# Patient Record
Sex: Male | Born: 1980 | Race: White | Hispanic: No | Marital: Single | Smoking: Current every day smoker
Health system: Southern US, Community
[De-identification: ages and names within clinical notes are randomized; demographics above are authoritative.]

## PROBLEM LIST (undated history)

## (undated) DIAGNOSIS — F1010 Alcohol abuse, uncomplicated: Secondary | ICD-10-CM

## (undated) DIAGNOSIS — B1920 Unspecified viral hepatitis C without hepatic coma: Secondary | ICD-10-CM

## (undated) DIAGNOSIS — F4310 Post-traumatic stress disorder, unspecified: Secondary | ICD-10-CM

## (undated) DIAGNOSIS — F419 Anxiety disorder, unspecified: Secondary | ICD-10-CM

## (undated) HISTORY — PX: FACIAL FRACTURE SURGERY: SHX1570

---

## 2021-05-29 ENCOUNTER — Encounter: Payer: Self-pay | Admitting: *Deleted

## 2021-05-29 NOTE — Congregational Nurse Program (Signed)
°  Dept: [phone]   Congregational Nurse Program Note  Date of Encounter: 05/29/2021  Past Medical History: No past medical history on file.  Encounter Details:  CNP Questionnaire - 05/29/21 1221       Questionnaire   Do you give verbal consent to treat you today? Yes    Location Patient Served  [facility]    Visit Setting Church or Organization    Patient Status Homeless    Engineer, building services or [employer] Referral N/A    Medication Have Medication Insecurities    Medical Provider No    Screening Referrals N/A    Medical Referral [facility]    Medical Appointment Made Non-[facility] PCP[facility]clinic    Food N/A    Transportation N/A    Housing/Utilities No permanent housing    Interpersonal Safety N/A    Intervention Patent attorney System    ED Visit Averted Yes    Life-Saving Intervention Made N/A            Client seen in [facility] lobby and asked about navigating health care in area. Client reports he recently arrived from [place] and does not have any keppra with him for his seizures. He said he has had three since coming to the area and asked if he should go to the ED to get his medication. Referred to Lavinia Sharps NP and gave intake papers to client. NP has agreed to see client today. He reports he had [employer] insurance while in [name].  Courtnay Petrilla W RN CN

## 2021-08-08 ENCOUNTER — Encounter (HOSPITAL_COMMUNITY): Payer: Self-pay

## 2021-08-08 ENCOUNTER — Other Ambulatory Visit: Payer: Self-pay

## 2021-08-08 ENCOUNTER — Emergency Department (HOSPITAL_COMMUNITY)
Admission: EM | Admit: 2021-08-08 | Discharge: 2021-08-08 | Disposition: A | Discharge status: Home / Self Care | Payer: Self-pay | Attending: Emergency Medicine | Admitting: Emergency Medicine

## 2021-08-08 ENCOUNTER — Emergency Department (HOSPITAL_COMMUNITY): Payer: Self-pay

## 2021-08-08 DIAGNOSIS — Z85118 Personal history of other malignant neoplasm of bronchus and lung: Secondary | ICD-10-CM | POA: Insufficient documentation

## 2021-08-08 DIAGNOSIS — S0081XA Abrasion of other part of head, initial encounter: Secondary | ICD-10-CM | POA: Insufficient documentation

## 2021-08-08 DIAGNOSIS — S0990XA Unspecified injury of head, initial encounter: Secondary | ICD-10-CM | POA: Insufficient documentation

## 2021-08-08 NOTE — ED Notes (Signed)
Face cleaned and patient given bandages to apply to nasal area. ?
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
## 2021-08-09 ENCOUNTER — Encounter: Payer: Self-pay | Admitting: *Deleted

## 2021-08-09 NOTE — Congregational Nurse Program (Signed)
?  Dept: [phone] ? ? ?Congregational Nurse Program Note ? ?Date of Encounter: 08/09/2021 ? ?Past Medical History: ?No past medical history on file. ? ?Encounter Details: ? CNP Questionnaire - 08/09/21 1422   ? ?  ? Questionnaire  ? Do you give verbal consent to treat you today? Yes   ? Location Patient Served  [facility]   ? Visit Setting Church or Organization   ? Patient Status Homeless   ? Sport and exercise psychologist or [employer]   ? Insurance Referral N/A   ? Medication Have Medication Insecurities   ? Medical Provider Yes   ? Screening Referrals N/A   ? Medical Referral [place]   ? Medical Appointment [place]   ? Food N/A   ? Transportation N/A   ? Housing/Utilities No permanent housing   ? Interpersonal Safety N/A   ? Intervention [employer];Support   ? ED Visit Averted N/A   ? Life-Saving Intervention Made N/A   ? ?  ?  ? ?  ? ?Client came to nurse's office inquiring about behavioral health services. Client reports he has hx of PTSD, depression and anxiety. He is currently being seen by Marliss Coots NP as his PCP. Referred to [facility] for walk in client to be evaluated for services. Gave two bus passes.  ?Barrie Wale W RN CN ? ? ?

## 2021-08-14 ENCOUNTER — Encounter: Payer: Self-pay | Admitting: *Deleted

## 2021-08-14 NOTE — Congregational Nurse Program (Signed)
?  Dept: [phone] ? ? ?Congregational Nurse Program Note ? ?Date of Encounter: 08/14/2021 ? ?Past Medical History: ?No past medical history on file. ? ?Encounter Details: ? CNP Questionnaire - 08/14/21 0836   ? ?  ? Questionnaire  ? Do you give verbal consent to treat you today? Yes   ? Location Patient Served  [facility]   ? Visit Setting Church or Organization   ? Patient Status Homeless   ? Insurance Medicaid   ? Insurance Referral N/A   ? Medication N/A   ? Medical Provider Yes   ? Screening Referrals N/A   ? Medical Referral N/A   ? Medical Appointment Made N/A   ? Food N/A   ? Transportation Provided transportation assistance   ? Housing/Utilities No permanent housing   ? Interpersonal Safety N/A   ? Intervention Support   ? ED Visit Averted N/A   ? Life-Saving Intervention Made N/A   ? ?  ?  ? ?  ? ?Client came to nurse's office asking for bus passes. He said he was going to [facility] and used the bus passes given from March 10th for the [facility] office. Gave two more bus passes. ?Genieve Ramaswamy W RN CN ? ? ?

## 2021-10-03 ENCOUNTER — Encounter: Payer: Self-pay | Admitting: *Deleted

## 2021-10-03 ENCOUNTER — Emergency Department (HOSPITAL_COMMUNITY)
Admission: EM | Admit: 2021-10-03 | Discharge: 2021-10-03 | Disposition: A | Discharge status: Home / Self Care | Payer: 59 | Attending: Emergency Medicine | Admitting: Emergency Medicine

## 2021-10-03 DIAGNOSIS — R0602 Shortness of breath: Secondary | ICD-10-CM | POA: Insufficient documentation

## 2021-10-03 DIAGNOSIS — R569 Unspecified convulsions: Secondary | ICD-10-CM | POA: Diagnosis not present

## 2021-10-03 DIAGNOSIS — F419 Anxiety disorder, unspecified: Secondary | ICD-10-CM | POA: Diagnosis not present

## 2021-10-03 DIAGNOSIS — Z5321 Procedure and treatment not carried out due to patient leaving prior to being seen by health care provider: Secondary | ICD-10-CM | POA: Diagnosis not present

## 2021-10-03 NOTE — ED Triage Notes (Signed)
BIBA ?Per EMS:  ?Pt [facility] w/ c/o anxiety attack. Hyperventilation  ?Lung clear ?Dx lung Ca x2 months ago ?Hx seizures - does not have access to keppra  ?140/70 ?84HR  ?[id number] ?97% RA ? ?

## 2021-10-03 NOTE — Congregational Nurse Program (Signed)
Called by chaplain to see pt having "asthma" attack. Pt sitting in hunched position, expiratory and inspiratory wheezing audible w/o stethoscope. Pt in very emotional state, crying, stating chest "hurts". Conversation of pt from one subject to another. Possible panic attackw/ asthma exacerbation. ? ?Bp 146/97 ?Hr 94 ?O2 sat 94% ?EMS present at time of VS check. ?Report given to EMS ?Transport to [place] ? ?

## 2021-10-03 NOTE — ED Notes (Signed)
Pt requesting to leave.

## 2021-10-07 ENCOUNTER — Encounter: Payer: Self-pay | Admitting: *Deleted

## 2021-10-07 NOTE — Congregational Nurse Program (Signed)
?  Dept: [phone] ? ? ?Congregational Nurse Program Note ? ?Date of Encounter: 10/07/2021 ? ?Past Medical History: ?No past medical history on file. ? ?Encounter Details: ? CNP Questionnaire - 10/07/21 1301   ? ?  ? Questionnaire  ? Do you give verbal consent to treat you today? Yes   ? Location Patient Served  [facility]   ? Visit Setting Church or Organization   ? Patient Status Homeless   ? Insurance Medicaid   ? Insurance Referral N/A   ? Medication Have Medication Insecurities   ? Medical Provider Yes   ? Screening Referrals N/A   ? Medical Referral N/A   ? Medical Appointment Made N/A   ? Food Have Food Insecurities   ? Transportation Need transportation assistance;Provided transportation assistance   ? Housing/Utilities No permanent housing   ? Interpersonal Safety Do not feel safe at current residence   ? Intervention Support   ? ED Visit Averted N/A   ? Life-Saving Intervention Made N/A   ? ?  ?  ? ?  ? ?Client seen at [facility] with f/u from ED visit. Client reports he does not have medication that is at [facility] because he does not have transportation. Gave two bus passes to p/u medication. Client is working with CM Katrina at [facility] and staying with [employer]. He has an appt with Lavinia Sharps NP at end of the month. No other requests or concerns at this time. ?Zeppelin Commisso W RN CN ? ? ?

## 2021-10-09 ENCOUNTER — Encounter: Payer: Self-pay | Admitting: *Deleted

## 2021-10-09 NOTE — Congregational Nurse Program (Signed)
?  Dept: [phone] ? ? ?Congregational Nurse Program Note ? ?Date of Encounter: 10/09/2021 ? ?Past Medical History: ?No past medical history on file. ? ?Encounter Details: ? CNP Questionnaire - 10/09/21 1400   ? ?  ? Questionnaire  ? Do you give verbal consent to treat you today? Yes   ? Location Patient Served  [facility]   ? Visit Setting Church or Organization   ? Patient Status Homeless   ? Insurance Medicaid   ? Insurance Referral N/A   ? Medication N/A   ? Medical Provider Yes   ? Screening Referrals N/A   ? Medical Referral [facility]   ? Medical Appointment Made Other   walk in appointments  ? Food N/A   ? Transportation Need transportation assistance;Provided transportation assistance   ? Housing/Utilities No permanent housing   ? Interpersonal Safety N/A   ? Intervention Support   ? ED Visit Averted N/A   ? Life-Saving Intervention Made N/A   ? ?  ?  ? ?  ? ?Client came to nurse's office after talking with [facility] SW. Earlier today, he had spoken with someone helping him with housing and became upset due to having to sign papers for release of information. Sat/listened with client as he expressed his frustration and explained reason for information request. Client had became so upset earlier, that he had expressed si thoughts. Client currently contracts for safety. He has agreed to go to [facility] in the morning to start working on his mental health needs.Gave bus passes for transportation. He is to meet with SW at [facility] again tomorrow and has a Water engineer.  ?Tahlia Deamer W RN CN  ? ? ?

## 2021-11-13 ENCOUNTER — Emergency Department (HOSPITAL_COMMUNITY): Payer: No Typology Code available for payment source

## 2021-11-13 ENCOUNTER — Encounter (HOSPITAL_COMMUNITY): Payer: Self-pay | Admitting: Emergency Medicine

## 2021-11-13 ENCOUNTER — Other Ambulatory Visit: Payer: Self-pay

## 2021-11-13 ENCOUNTER — Emergency Department (HOSPITAL_COMMUNITY)
Admission: EM | Admit: 2021-11-13 | Discharge: 2021-11-14 | Disposition: A | Discharge status: Home / Self Care | Payer: No Typology Code available for payment source | Attending: Emergency Medicine | Admitting: Emergency Medicine

## 2021-11-13 DIAGNOSIS — J189 Pneumonia, unspecified organism: Secondary | ICD-10-CM | POA: Diagnosis not present

## 2021-11-13 DIAGNOSIS — F410 Panic disorder [episodic paroxysmal anxiety] without agoraphobia: Secondary | ICD-10-CM | POA: Insufficient documentation

## 2021-11-13 DIAGNOSIS — Z20822 Contact with and (suspected) exposure to covid-19: Secondary | ICD-10-CM | POA: Diagnosis not present

## 2021-11-13 HISTORY — DX: Anxiety disorder, unspecified: F41.9

## 2021-11-13 HISTORY — DX: Post-traumatic stress disorder, unspecified: F43.10

## 2021-11-13 LAB — RESP PANEL BY RT-PCR (FLU A&B, COVID) ARPGX2
Influenza A by PCR: NEGATIVE
Influenza B by PCR: NEGATIVE
SARS Coronavirus 2 by RT PCR: NEGATIVE

## 2021-11-13 LAB — CBC
HCT: 51.5 % (ref 39.0–52.0)
Hemoglobin: 17.6 g/dL — ABNORMAL HIGH (ref 13.0–17.0)
MCH: 32 pg (ref 26.0–34.0)
MCHC: 34.2 g/dL (ref 30.0–36.0)
MCV: 93.6 fL (ref 80.0–100.0)
Platelets: 252 10*3/uL (ref 150–400)
RBC: 5.5 MIL/uL (ref 4.22–5.81)
RDW: 12.5 % (ref 11.5–15.5)
WBC: 16.8 10*3/uL — ABNORMAL HIGH (ref 4.0–10.5)
nRBC: 0 % (ref 0.0–0.2)

## 2021-11-13 LAB — TROPONIN I (HIGH SENSITIVITY): Troponin I (High Sensitivity): 2 ng/L (ref <18)

## 2021-11-13 LAB — BASIC METABOLIC PANEL
Anion gap: 14 (ref 5–15)
BUN: UNDETERMINED mg/dL (ref 6–20)
CO2: 14 mmol/L — ABNORMAL LOW (ref 22–32)
Calcium: 9.2 mg/dL (ref 8.9–10.3)
Chloride: 110 mmol/L (ref 98–111)
Creatinine, Ser: UNDETERMINED mg/dL (ref 0.61–1.24)
Glucose, Bld: 103 mg/dL — ABNORMAL HIGH (ref 70–99)
Potassium: 4.3 mmol/L (ref 3.5–5.1)
Sodium: 138 mmol/L (ref 135–145)

## 2021-11-13 MED ORDER — LORAZEPAM 1 MG PO TABS
1.0000 mg | ORAL_TABLET | Freq: Once | ORAL | Status: AC
  Administered 2021-11-13: 1 mg via ORAL
  Filled 2021-11-13: qty 1

## 2021-11-13 NOTE — ED Provider Triage Note (Addendum)
Emergency Medicine Provider Triage Evaluation Note  Raymond [name] , a 41 y.o. male  was evaluated in triage.  Pt complains of chest tightness and anxiety.  Patient reports that he has a history of anxiety, PTSD.  Patient reports that he is also experiencing chest tightness that is associated with shortness of breath, nonradiating.  Patient denies any medications currently.  Patient states that he has lung cancer.  Patient hyperventilating on examination.  Patient also noted to be febrile in triage, endorsing some nasal congestion.  Respiratory panel has been placed.  Review of Systems  Positive:  Negative:   Physical Exam  BP (!) 142/105 (BP Location: Left Arm)   Pulse (!) 141   Temp (!) 100.4 F (38 C) (Oral)   Resp 18   Ht 5\' 8"  (1.727 m)   Wt 90.7 kg   SpO2 94%   BMI 30.41 kg/m  Gen:   Awake, no distress   Resp:  Normal effort  MSK:   Moves extremities without difficulty  Other:    Medical Decision Making  Medically screening exam initiated at 8:09 PM.  Appropriate orders placed.  was informed that the remainder of the evaluation will be completed by another provider, this initial triage assessment does not replace that evaluation, and the importance of remaining in the ED until their evaluation is complete.     [name], PA-C 11/13/21 2010    11/15/21, PA-C 11/13/21 2010    11/15/21, PA-C 11/13/21 2018

## 2021-11-13 NOTE — ED Triage Notes (Signed)
  Patient BIB EMS from [facility] [employer].  Patient states for the past two days he has felt tightness in his chest and panicky.  Patient has hx of anxiety and PTSD.  Patient states he hasn't been able to sleep in 2 days because he is afraid he wont wake up.  Denies any drug use or alcohol.  Pain 5/10, tightness in chest.

## 2021-11-14 ENCOUNTER — Encounter (HOSPITAL_COMMUNITY): Payer: Self-pay | Admitting: Emergency Medicine

## 2021-11-14 MED ORDER — DOXYCYCLINE HYCLATE 100 MG PO TABS
100.0000 mg | ORAL_TABLET | Freq: Once | ORAL | Status: AC
  Administered 2021-11-14: 100 mg via ORAL
  Filled 2021-11-14: qty 1

## 2021-11-14 MED ORDER — DOXYCYCLINE HYCLATE 100 MG PO CAPS
100.0000 mg | ORAL_CAPSULE | Freq: Two times a day (BID) | ORAL | 0 refills | Status: DC
Start: 2021-11-14 — End: 2021-11-22

## 2021-11-14 MED ORDER — AEROCHAMBER Z-STAT PLUS/MEDIUM MISC
1.0000 | Freq: Once | Status: AC
  Administered 2021-11-14: 1

## 2021-11-14 MED ORDER — NAPROXEN 500 MG PO TABS
500.0000 mg | ORAL_TABLET | Freq: Once | ORAL | Status: AC
  Administered 2021-11-14: 500 mg via ORAL
  Filled 2021-11-14: qty 1

## 2021-11-14 MED ORDER — ALBUTEROL SULFATE HFA 108 (90 BASE) MCG/ACT IN AERS
2.0000 | INHALATION_SPRAY | RESPIRATORY_TRACT | Status: DC | PRN
  Administered 2021-11-14: 2 via RESPIRATORY_TRACT
  Filled 2021-11-14: qty 6.7

## 2021-11-14 NOTE — ED Provider Notes (Signed)
[facility]Y DEPT Provider Note: Lowella Dell, MD, FACEP  CSN: [id number] MRN: [id number] ARRIVAL: 11/13/21 at 1937 ROOM: WA21/WA21   CHIEF COMPLAINT  Panic Attack   HISTORY OF PRESENT ILLNESS  11/14/21 2:51 AM Raymond [name] is a 41 y.o. male from [place].  He is here stating he has been unable to sleep for 2 nights because he is afraid he will not wake up.  He has been having panic attacks and was hyperventilating on arrival.  He was given 1 mg of Ativan and was able to sleep for several hours.  When he was awakened and moved back to his room he started having another panic attack.  He states he feels like he cannot breathe and he has tightness in his chest which he attributes to rapid breathing.  This tightness is worse with certain movements or palpation of the chest.  He is using a borrowed albuterol inhaler.  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  He was noted to have a temperature as high as 100.7 in the emergency department.   Past Medical History:  Diagnosis Date   Anxiety    PTSD (post-traumatic stress disorder)     History reviewed. No pertinent surgical history.  History reviewed. No pertinent family history.  Social History   Substance Use Topics   Alcohol use: Not Currently   Drug use: Not Currently    Prior to Admission medications   Medication Sig Start Date End Date Taking? Authorizing Provider  doxycycline (VIBRAMYCIN) 100 MG capsule Take 1 capsule (100 mg total) by mouth 2 (two) times daily. One po bid x 7 days 11/14/21  Yes Shalla Bulluck, MD  albuterol (VENTOLIN HFA) 108 (90 Base) MCG/ACT inhaler SMARTSIG:2 Puff(s) Via Inhaler 4 Times Daily PRN 10/04/21   [provider]  prazosin (MINIPRESS) 1 MG capsule Take 1 mg by mouth at bedtime. 09/05/21   [provider]  traZODone (DESYREL) 50 MG tablet Take 50 mg by mouth at bedtime. 09/05/21    [provider]  ZOLOFT 25 MG tablet Take 25 mg by mouth daily. 09/05/21   [provider]    Allergies Other   REVIEW OF SYSTEMS  Negative except as noted here or in the History of Present Illness.   PHYSICAL EXAMINATION  Initial Vital Signs Blood pressure (!) 147/101, pulse (!) 122, temperature 99.4 F (37.4 C), temperature source Oral, resp. rate 20, height 5\' 8"  (1.727 m), weight 90.7 kg, SpO2 95 %.  Examination General: Well-developed, well-nourished male in no acute distress; appearance consistent with age of record HENT: normocephalic; atraumatic Eyes: pupils equal, round and reactive to light; extraocular muscles intact Neck: supple Heart: regular rate and rhythm; no murmurs, rubs or gallops Lungs: Hyperventilating Chest: Pain on movement of anterior chest Abdomen: soft; nondistended; nontender; bowel sounds present Extremities: No deformity; full range of motion; pulses normal Neurologic: Awake, alert; motor function intact in all extremities and symmetric; no facial droop Skin: Warm and dry Psychiatric: Anxious; tearful   RESULTS  Summary of this visit's results, reviewed and interpreted by myself:   EKG Interpretation  Date/Time:  Wednesday November 13 2021 20:06:18 EDT Ventricular Rate:  137 PR Interval:  146 QRS Duration: 81 QT Interval:  280 QTC Calculation: 423 R Axis:   5 Text Interpretation: Sinus tachycardia Minimal ST depression, lateral leads Borderline ST elevation, anterior leads No previous ECGs available Confirmed  by Paula Libra ([id number]) on 11/14/2021 2:52:10 AM       Laboratory Studies: Results for orders placed or performed during the hospital encounter of 11/13/21 (from the past 24 hour(s))  Resp Panel by RT-PCR (Flu A&B, Covid) Anterior Nasal Swab     Status: None   Collection Time: 11/13/21  8:16 PM   Specimen: Anterior Nasal Swab  Result Value Ref Range   SARS Coronavirus 2 by RT PCR NEGATIVE NEGATIVE   Influenza A by  PCR NEGATIVE NEGATIVE   Influenza B by PCR NEGATIVE NEGATIVE  Basic metabolic panel     Status: Abnormal   Collection Time: 11/13/21  9:05 PM  Result Value Ref Range   Sodium 138 135 - 145 mmol/L   Potassium 4.3 3.5 - 5.1 mmol/L   Chloride 110 98 - 111 mmol/L   CO2 14 (L) 22 - 32 mmol/L   Glucose, Bld 103 (H) 70 - 99 mg/dL   BUN QUANTITY NOT SUFFICIENT, UNABLE TO PERFORM TEST 6 - 20 mg/dL   Creatinine, Ser QUANTITY NOT SUFFICIENT, UNABLE TO PERFORM TEST 0.61 - 1.24 mg/dL   Calcium 9.2 8.9 - 44.0 mg/dL   GFR, Estimated NOT CALCULATED >60 mL/min   Anion gap 14 5 - 15  CBC     Status: Abnormal   Collection Time: 11/13/21  9:05 PM  Result Value Ref Range   WBC 16.8 (H) 4.0 - 10.5 K/uL   RBC 5.50 4.22 - 5.81 MIL/uL   Hemoglobin 17.6 (H) 13.0 - 17.0 g/dL   HCT 34.7 42.5 - 95.6 %   MCV 93.6 80.0 - 100.0 fL   MCH 32.0 26.0 - 34.0 pg   MCHC 34.2 30.0 - 36.0 g/dL   RDW 38.7 56.4 - 33.2 %   Platelets 252 150 - 400 K/uL   nRBC 0.0 0.0 - 0.2 %  Troponin I (High Sensitivity)     Status: None   Collection Time: 11/13/21  9:05 PM  Result Value Ref Range   Troponin I (High Sensitivity) 2 <18 ng/L   Imaging Studies: DG Chest Port 1 View  Result Date: 11/13/2021 CLINICAL DATA:  Chest pain. EXAM: PORTABLE CHEST 1 VIEW COMPARISON:  Chest radiograph dated 08/08/2021. FINDINGS: Bilateral lower lung field faint nodularity appear more progressed since the prior radiograph and may represent developing infiltrate, possibly atypical in etiology. Clinical correlation is recommended. No consolidative changes. There is no pleural effusion pneumothorax. The cardiac silhouette is within limits. No acute osseous pathology. IMPRESSION: Possible developing infiltrate at the lung bases. No focal consolidation. Electronically Signed   By: Elgie Collard M.D.   On: 11/13/2021 20:32    ED COURSE and MDM  Nursing notes, initial and subsequent vitals signs, including pulse oximetry, reviewed and interpreted by  myself.  Vitals:   11/13/21 1947 11/13/21 2002 11/13/21 2152 11/14/21 0051  BP: (!) 142/105  (!) 176/139 (!) 147/101  Pulse: (!) 141  (!) 132 (!) 122  Resp: 18  18 20   Temp: (!) 100.4 F (38 C)  (!) 100.7 F (38.2 C) 99.4 F (37.4 C)  TempSrc: Oral  Oral Oral  SpO2: 94%  (!) 87% 95%  Weight:  90.7 kg    Height:  5\' 8"  (1.727 m)     Medications  albuterol (VENTOLIN HFA) 108 (90 Base) MCG/ACT inhaler 2 puff (has no administration in time range)  aerochamber plus with mask device 1 each (has no administration in time range)  naproxen (NAPROSYN) tablet 500 mg (has no administration  in time range)  doxycycline (VIBRA-TABS) tablet 100 mg (has no administration in time range)  LORazepam (ATIVAN) tablet 1 mg (1 mg Oral Given 11/13/21 2019)   The patient appears to have both an anxiety issue as well as legitimate respiratory issue.  He has a low-grade fever as well as what appears to be early infiltrates on his chest x-ray.  We will provide him his own inhaler and AeroChamber and start him on doxycycline for possible pneumonia.  He was advised to return if breathing worsens.   PROCEDURES  Procedures   ED DIAGNOSES     ICD-10-CM   1. Panic attack  F41.0     2. Multifocal pneumonia  J18.9          Thelma Lorenzetti, Jonny Ruiz, MD 11/14/21 [phone]

## 2021-11-22 ENCOUNTER — Encounter (HOSPITAL_COMMUNITY): Payer: Self-pay

## 2021-11-22 ENCOUNTER — Emergency Department (HOSPITAL_COMMUNITY): Payer: 59

## 2021-11-22 ENCOUNTER — Other Ambulatory Visit: Payer: Self-pay

## 2021-11-22 ENCOUNTER — Emergency Department (HOSPITAL_COMMUNITY)
Admission: EM | Admit: 2021-11-22 | Discharge: 2021-11-22 | Disposition: A | Discharge status: Home / Self Care | Payer: 59 | Attending: Emergency Medicine | Admitting: Emergency Medicine

## 2021-11-22 DIAGNOSIS — R072 Precordial pain: Secondary | ICD-10-CM | POA: Diagnosis present

## 2021-11-22 DIAGNOSIS — J181 Lobar pneumonia, unspecified organism: Secondary | ICD-10-CM | POA: Diagnosis not present

## 2021-11-22 DIAGNOSIS — J189 Pneumonia, unspecified organism: Secondary | ICD-10-CM

## 2021-11-22 DIAGNOSIS — R079 Chest pain, unspecified: Secondary | ICD-10-CM

## 2021-11-22 HISTORY — DX: Unspecified viral hepatitis C without hepatic coma: B19.20

## 2021-11-22 LAB — BASIC METABOLIC PANEL
Anion gap: 9 (ref 5–15)
BUN: 9 mg/dL (ref 6–20)
CO2: 23 mmol/L (ref 22–32)
Calcium: 9.2 mg/dL (ref 8.9–10.3)
Chloride: 107 mmol/L (ref 98–111)
Creatinine, Ser: 0.72 mg/dL (ref 0.61–1.24)
GFR, Estimated: >60 mL/min (ref >60)
Glucose, Bld: 98 mg/dL (ref 70–99)
Potassium: 4.3 mmol/L (ref 3.5–5.1)
Sodium: 139 mmol/L (ref 135–145)

## 2021-11-22 LAB — CBC
HCT: 46.7 % (ref 39.0–52.0)
Hemoglobin: 15.6 g/dL (ref 13.0–17.0)
MCH: 31.1 pg (ref 26.0–34.0)
MCHC: 33.4 g/dL (ref 30.0–36.0)
MCV: 93.2 fL (ref 80.0–100.0)
Platelets: 308 10*3/uL (ref 150–400)
RBC: 5.01 MIL/uL (ref 4.22–5.81)
RDW: 12.4 % (ref 11.5–15.5)
WBC: 8 10*3/uL (ref 4.0–10.5)
nRBC: 0 % (ref 0.0–0.2)

## 2021-11-22 LAB — TROPONIN I (HIGH SENSITIVITY)
Troponin I (High Sensitivity): 3 ng/L (ref <18)
Troponin I (High Sensitivity): 4 ng/L (ref <18)

## 2021-11-22 MED ORDER — MELOXICAM 15 MG PO TABS: 15.0000 mg | ORAL_TABLET | Freq: Every day | ORAL | 0 refills | Status: AC

## 2021-11-22 MED ORDER — IOHEXOL 350 MG/ML SOLN
100.0000 mL | Freq: Once | INTRAVENOUS | Status: AC | PRN
  Administered 2021-11-22: 100 mL via INTRAVENOUS

## 2021-11-22 MED ORDER — FENTANYL CITRATE PF 50 MCG/ML IJ SOSY
50.0000 ug | PREFILLED_SYRINGE | Freq: Once | INTRAMUSCULAR | Status: AC
  Administered 2021-11-22: 50 ug via INTRAVENOUS
  Filled 2021-11-22: qty 1

## 2021-11-22 MED ORDER — AZITHROMYCIN 250 MG PO TABS: 250.0000 mg | ORAL_TABLET | Freq: Every day | ORAL | 0 refills | Status: DC

## 2021-11-22 MED ORDER — AMOXICILLIN-POT CLAVULANATE 875-125 MG PO TABS: 1.0000 | ORAL_TABLET | Freq: Two times a day (BID) | ORAL | 0 refills | Status: DC

## 2021-11-22 NOTE — ED Triage Notes (Signed)
Pt BIB [facility] c/o chest pain. Pt reports having a fall to right side 2 days ago suffered CP now worsening pain. Pt describes as sharp pain when taking a breath.

## 2021-11-27 ENCOUNTER — Encounter: Payer: Self-pay | Admitting: *Deleted

## 2021-11-27 NOTE — Congregational Nurse Program (Signed)
  Dept: [phone]   Congregational Nurse Program Note  Date of Encounter: 11/27/2021  Past Medical History: Past Medical History:  Diagnosis Date   Anxiety    Hepatitis C    PTSD (post-traumatic stress disorder)     Encounter Details:  CNP Questionnaire - 11/27/21 0924       Questionnaire   Do you give verbal consent to treat you today? Yes    Location Patient Served  [facility]    Visit Setting Church or Organization    Patient Status Homeless    Insurance [facility]    Insurance Referral N/A    Medication N/A    Medical Provider Yes    Screening Referrals N/A    Medical Referral N/A    Medical Appointment Made Other;N/A    Food N/A    Transportation Need transportation assistance;Provided transportation assistance    Housing/Utilities No permanent housing    Interpersonal Safety N/A    Intervention Support    ED Visit Averted N/A    Life-Saving Intervention Made N/A            Client came to nurse's office requesting help with transportation. Client had hospital discharge papers with him showing medication prescribed and he needs to pick up. Gave bus passes for medication pick up. Educated client on importance of taking antibiotics as prescribed and not missing doses. He acknowledges understanding. Anjalina Bergevin W RN CN

## 2021-12-23 ENCOUNTER — Encounter: Payer: Self-pay | Admitting: *Deleted

## 2021-12-23 NOTE — Congregational Nurse Program (Signed)
  Dept: [phone]   Congregational Nurse Program Note  Date of Encounter: 12/23/2021  Past Medical History: Past Medical History:  Diagnosis Date   Anxiety    Hepatitis C    PTSD (post-traumatic stress disorder)     Encounter Details:  CNP Questionnaire - 12/23/21 1259       Questionnaire   Do you give verbal consent to treat you today? Yes    Location Patient Served  [facility]    Visit Setting Church or Organization    Patient Status Homeless    Insurance [facility]    Insurance Referral N/A    Medication N/A    Medical Provider Yes    Screening Referrals N/A    Medical Referral N/A    Medical Appointment Made Other;N/A    Food N/A    Transportation Need transportation assistance;Provided transportation assistance    Housing/Utilities No permanent housing    Interpersonal Safety N/A    Intervention Support    ED Visit Averted N/A    Life-Saving Intervention Made N/A            Client came to nurse's office requesting help with transportation to new housing. Client reports he is in process of getting housing and has been working with CM on completing disability papers.Gave two discount bus passes as requested. Hellon Vaccarella W RN CN

## 2022-01-06 ENCOUNTER — Encounter: Payer: Self-pay | Admitting: *Deleted

## 2022-01-06 NOTE — Congregational Nurse Program (Signed)
  Dept: [phone]   Congregational Nurse Program Note  Date of Encounter: 01/06/2022  Past Medical History: Past Medical History:  Diagnosis Date   Anxiety    Hepatitis C    PTSD (post-traumatic stress disorder)     Encounter Details:  CNP Questionnaire - 01/06/22 1440       Questionnaire   Do you give verbal consent to treat you today? Yes    Location Patient Served  [facility]    Visit Setting Church or Organization    Patient Status Homeless    Insurance [facility]    Insurance Referral N/A    Medication N/A    Medical Provider Yes    Screening Referrals N/A    Medical Referral N/A    Medical Appointment Made N/A    Food N/A    Transportation Need transportation assistance;Provided transportation assistance    Housing/Utilities No permanent housing    Interpersonal Safety N/A    Intervention Support    ED Visit Averted N/A    Life-Saving Intervention Made N/A            Client came to nurse's office requesting help with transportation to [facility] office concerning disability. Gave two bus passes as requested. Malayah Demuro W RN CN

## 2022-01-08 LAB — GLUCOSE, POCT (MANUAL RESULT ENTRY): POC Glucose: 283 mg/dl — AB (ref 70–99)

## 2022-06-06 ENCOUNTER — Encounter: Payer: Self-pay | Admitting: *Deleted

## 2022-06-06 NOTE — Congregational Nurse Program (Signed)
  Dept: [phone]   Congregational Nurse Program Note  Date of Encounter: 06/06/2022  Past Medical History: Past Medical History:  Diagnosis Date   Anxiety    Hepatitis C    PTSD (post-traumatic stress disorder)     Encounter Details:  CNP Questionnaire - 06/06/22 1144       Questionnaire   Ask client: Do you give verbal consent for me to treat you today? Yes    Student Assistance N/A    Location Patient Served  [facility]    Visit Setting with Client Organization    Patient Status Unknown    Insurance Medicaid    Insurance/Financial Assistance Referral N/A    Medication N/A    Medical Provider No    Screening Referrals Made N/A    Medical Referrals Made [facility]    Medical Appointment Made N/A    Recently w/o PCP, now 1st time PCP visit completed due to CNs referral or appointment made N/A    Food N/A    Transportation N/A    Housing/Utilities N/A    Interpersonal Safety N/A    Interventions Advocate/Support    Abnormal to Normal Screening Since Last CN Visit N/A    Screenings CN Performed Blood Pressure    Sent Client to Lab for: N/A    Did client attend any of the following based off CNs referral or appointments made? N/A    ED Visit Averted N/A    Life-Saving Intervention Made N/A           Client came to nurse's office asking about PCP. Client has seen Marliss Coots NP in the past but now has medicaid. He currently has a place to stay. Contacted provider listed on his insurance card Dr Katherine Roan [phone] and office is closed on Friday. Showed client where find information on his insurance card and advised to contact Monday to establish his PCP. Client acknowledges understanding. Raymond [name] W RN CN

## 2022-08-13 LAB — AMB RESULTS CONSOLE CBG: Glucose: 151

## 2022-08-13 NOTE — Progress Notes (Signed)
Pt is not fasting. Pt has case manager through [facility] to help find PCP

## 2022-09-20 ENCOUNTER — Emergency Department (HOSPITAL_COMMUNITY): Payer: No Typology Code available for payment source

## 2022-09-20 ENCOUNTER — Encounter (HOSPITAL_COMMUNITY): Payer: Self-pay

## 2022-09-20 ENCOUNTER — Other Ambulatory Visit: Payer: Self-pay

## 2022-09-20 ENCOUNTER — Emergency Department (HOSPITAL_COMMUNITY)
Admission: EM | Admit: 2022-09-20 | Discharge: 2022-09-22 | Disposition: A | Discharge status: Other Acute Inpatient Hospital | Payer: No Typology Code available for payment source | Attending: Emergency Medicine | Admitting: Emergency Medicine

## 2022-09-20 DIAGNOSIS — F17210 Nicotine dependence, cigarettes, uncomplicated: Secondary | ICD-10-CM | POA: Diagnosis not present

## 2022-09-20 DIAGNOSIS — R0682 Tachypnea, not elsewhere classified: Secondary | ICD-10-CM | POA: Insufficient documentation

## 2022-09-20 DIAGNOSIS — F410 Panic disorder [episodic paroxysmal anxiety] without agoraphobia: Secondary | ICD-10-CM | POA: Insufficient documentation

## 2022-09-20 DIAGNOSIS — G40909 Epilepsy, unspecified, not intractable, without status epilepticus: Secondary | ICD-10-CM | POA: Diagnosis not present

## 2022-09-20 DIAGNOSIS — F329 Major depressive disorder, single episode, unspecified: Secondary | ICD-10-CM | POA: Diagnosis not present

## 2022-09-20 DIAGNOSIS — F32A Depression, unspecified: Secondary | ICD-10-CM | POA: Diagnosis present

## 2022-09-20 DIAGNOSIS — F29 Unspecified psychosis not due to a substance or known physiological condition: Secondary | ICD-10-CM | POA: Insufficient documentation

## 2022-09-20 DIAGNOSIS — R45851 Suicidal ideations: Secondary | ICD-10-CM | POA: Insufficient documentation

## 2022-09-20 LAB — ACETAMINOPHEN LEVEL: Acetaminophen (Tylenol), Serum: <10 ug/mL — ABNORMAL LOW (ref 10–30)

## 2022-09-20 LAB — COMPREHENSIVE METABOLIC PANEL
ALT: 32 U/L (ref 0–44)
AST: 31 U/L (ref 15–41)
Albumin: 4.6 g/dL (ref 3.5–5.0)
Alkaline Phosphatase: 124 U/L (ref 38–126)
Anion gap: 12 (ref 5–15)
BUN: 8 mg/dL (ref 6–20)
CO2: 19 mmol/L — ABNORMAL LOW (ref 22–32)
Calcium: 9.2 mg/dL (ref 8.9–10.3)
Chloride: 103 mmol/L (ref 98–111)
Creatinine, Ser: 1.03 mg/dL (ref 0.61–1.24)
GFR, Estimated: >60 mL/min (ref >60)
Glucose, Bld: 89 mg/dL (ref 70–99)
Potassium: 3.5 mmol/L (ref 3.5–5.1)
Sodium: 134 mmol/L — ABNORMAL LOW (ref 135–145)
Total Bilirubin: 1.7 mg/dL — ABNORMAL HIGH (ref 0.3–1.2)
Total Protein: 7.8 g/dL (ref 6.5–8.1)

## 2022-09-20 LAB — SALICYLATE LEVEL: Salicylate Lvl: <7 mg/dL — ABNORMAL LOW (ref 7.0–30.0)

## 2022-09-20 LAB — CBC
HCT: 46 % (ref 39.0–52.0)
Hemoglobin: 16.3 g/dL (ref 13.0–17.0)
MCH: 31.3 pg (ref 26.0–34.0)
MCHC: 35.4 g/dL (ref 30.0–36.0)
MCV: 88.3 fL (ref 80.0–100.0)
Platelets: 266 10*3/uL (ref 150–400)
RBC: 5.21 MIL/uL (ref 4.22–5.81)
RDW: 14.1 % (ref 11.5–15.5)
WBC: 7.9 10*3/uL (ref 4.0–10.5)
nRBC: 0 % (ref 0.0–0.2)

## 2022-09-20 LAB — RAPID URINE DRUG SCREEN, HOSP PERFORMED
Amphetamines: POSITIVE — AB
Barbiturates: NOT DETECTED
Benzodiazepines: NOT DETECTED
Cocaine: POSITIVE — AB
Opiates: NOT DETECTED
Tetrahydrocannabinol: POSITIVE — AB

## 2022-09-20 LAB — ETHANOL: Alcohol, Ethyl (B): <10 mg/dL (ref <10)

## 2022-09-20 MED ORDER — LORAZEPAM 1 MG PO TABS
1.0000 mg | ORAL_TABLET | Freq: Once | ORAL | Status: AC
  Administered 2022-09-20: 1 mg via ORAL
  Filled 2022-09-20: qty 1
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
## 2022-09-20 NOTE — ED Notes (Signed)
Pt is voluntary  consent form attached to the clipboard in orange zone

## 2022-09-20 NOTE — ED Notes (Signed)
Pt belongings has been placed in locker 3.Wearing burgundy scrubs at this time.

## 2022-09-20 NOTE — ED Notes (Signed)
IVC paperwork complete and in orange zone, copies sent to [facility], original in red folder, expires 09/27/22

## 2022-09-20 NOTE — ED Triage Notes (Signed)
Patient reports that he had seizure today and has tried to kill himself x 2 today. Reports panic attacks and BH history. Patient alert and appears anxious.patient states that he took extra pills today but can't specify. Patient with flight of ideas. Wanting bus ticket to [place] to see son.

## 2022-09-20 NOTE — ED Notes (Signed)
Pt is cooperative at this time, extremely anxious. Pt does endorse suicidal ideas at this time. Flight of ideas, long tangents noted during conversations, difficulty answering simple questions. Pt in purple scrubs. Belongings secured by previous shift per RN report. No sitter at this time.

## 2022-09-20 NOTE — ED Provider Notes (Signed)
[place] EMERGENCY DEPARTMENT AT [facility] Provider Note   CSN: [id number] Arrival date & time: 09/20/22  1457     History  No chief complaint on file.   Raymond [name] is a 42 y.o. male with past medical history of GAD, PTSD, depression, hepatitis C, tobacco abuse, seizures, TBI presents to the ED with suicidal ideations.  Patient states that over the last 2 days he has taken excess medication in an attempt to harm himself.  He reports he is still feeling suicidal and is "afraid to go home alone and die alone".  Patient has not taken his home medications today.  He states that he has also been having an increase in seizures and that his medications are not working.  Patient lives alone.  He admits to smoking cigarettes, but denies drug or alcohol use.  Patient also reports visual hallucinations of his deceased mother and states that he talks to her often.  Patient self reports diagnosis of schizophrenia, but unable to find this in patient's most recent office visit with family medicine.  He states he is feeling very panicky and wants a motel voucher so that he can stay with his cousin and not be alone.  Denies homicidal ideations, nausea, vomiting, chest pain, shortness of breath, syncope, weakness, dizziness, or headaches.       Home Medications Prior to Admission medications   Medication Sig Start Date End Date Taking? Authorizing Provider  albuterol (VENTOLIN HFA) 108 (90 Base) MCG/ACT inhaler SMARTSIG:2 Puff(s) Via Inhaler 4 Times Daily PRN Patient not taking: Reported on 11/22/2021 10/04/21   [provider]  amoxicillin-clavulanate (AUGMENTIN) 875-125 MG tablet Take 1 tablet by mouth every 12 (twelve) hours. 11/22/21   Darrick Grinder, PA-C  azithromycin (ZITHROMAX) 250 MG tablet Take 1 tablet (250 mg total) by mouth daily. Take first 2 tablets together, then 1 every day until finished. 11/22/21   Darrick Grinder, PA-C  prazosin (MINIPRESS) 1 MG capsule Take 1 mg by  mouth at bedtime. 09/05/21   [provider]  predniSONE (DELTASONE) 20 MG tablet Take 40 mg by mouth daily. 5 day course. Patient not taking: Reported on 11/22/2021 11/14/21   [provider]  sertraline (ZOLOFT) 25 MG tablet Take 25 mg by mouth daily.    [provider]  traZODone (DESYREL) 50 MG tablet Take 50 mg by mouth at bedtime. 09/05/21   [provider]      Allergies    Other    Review of Systems   Review of Systems  Respiratory:  Negative for shortness of breath.   Cardiovascular:  Negative for chest pain.  Gastrointestinal:  Negative for nausea and vomiting.  Neurological:  Positive for seizures. Negative for dizziness, syncope, weakness and headaches.  Psychiatric/Behavioral:  Positive for hallucinations, self-injury and suicidal ideas. The patient is nervous/anxious.     Physical Exam Updated Vital Signs BP (!) 157/94 (BP Location: Right Arm)   Pulse 69   Temp 98.2 F (36.8 C) (Oral)   Resp 18   SpO2 100%  Physical Exam Vitals and nursing note reviewed.  Constitutional:      General: He is not in acute distress.    Appearance: Normal appearance. He is not ill-appearing or diaphoretic.  Cardiovascular:     Rate and Rhythm: Normal rate and regular rhythm.  Pulmonary:     Effort: Pulmonary effort is normal. Tachypnea present. No accessory muscle usage or respiratory distress.     Breath sounds: Normal breath  sounds and air entry.  Neurological:     Mental Status: He is alert. Mental status is at baseline.  Psychiatric:        Attention and Perception: Attention normal. He perceives visual hallucinations. He does not perceive auditory hallucinations.        Mood and Affect: Mood is anxious.        Speech: Speech is rapid and pressured.        Behavior: Behavior is hyperactive. Behavior is not agitated or aggressive. Behavior is cooperative.        Thought Content: Thought content includes suicidal ideation. Thought content does not  include homicidal ideation. Thought content includes suicidal plan. Thought content does not include homicidal plan.        Judgment: Judgment is impulsive.     ED Results / Procedures / Treatments   Labs (all labs ordered are listed, but only abnormal results are displayed) Labs Reviewed  COMPREHENSIVE METABOLIC PANEL - Abnormal; Notable for the following components:      Result Value   Sodium 134 (*)    CO2 19 (*)    Total Bilirubin 1.7 (*)    All other components within normal limits  SALICYLATE LEVEL - Abnormal; Notable for the following components:   Salicylate Lvl <7.0 (*)    All other components within normal limits  ACETAMINOPHEN LEVEL - Abnormal; Notable for the following components:   Acetaminophen (Tylenol), Serum <10 (*)    All other components within normal limits  RAPID URINE DRUG SCREEN, HOSP PERFORMED - Abnormal; Notable for the following components:   Cocaine POSITIVE (*)    Amphetamines POSITIVE (*)    Tetrahydrocannabinol POSITIVE (*)    All other components within normal limits  ETHANOL  CBC    EKG None  Radiology DG Chest 1 View  Result Date: 09/20/2022 CLINICAL DATA:  Tachypnea. EXAM: CHEST  1 VIEW COMPARISON:  November 22, 2021 FINDINGS: The heart size and mediastinal contours are within normal limits. Low lung volumes are noted. There is no evidence of an acute infiltrate, pleural effusion or pneumothorax. The visualized skeletal structures are unremarkable. IMPRESSION: No active disease. Electronically Signed   By: Aram Candela M.D.   On: 09/20/2022 19:10    Procedures Procedures    Medications Ordered in ED Medications  LORazepam (ATIVAN) tablet 1 mg (has no administration in time range)    ED Course/ Medical Decision Making/ A&P                             Medical Decision Making Amount and/or Complexity of Data Reviewed Labs: ordered. Radiology: ordered.  Risk Prescription drug management.   Patient is a 42 y.o. male  who  presents to the emergency department for psychiatric complaint.  Past Medical History: PTSD, GAD, depression, seizures, TBI  Physical Exam: Exam significant for an anxious, tachypneic patient who is cooperative.  He is not agitated or aggressive.  Patient has rapid, pressured circumferential speech.  He has flight of ideas.  He will return to talking about needing a motel voucher so he can stay with his cousin multiple times.  He is currently reporting suicidal ideations, but denies harming himself today.  Patient admits to taking excess prescription medications yesterday and the day prior as an attempt to harm himself.  Pulmonary effort normal with clear lung sounds.  He has mild tremors in his hands.  Denies homicidal ideations.    Labs and Imaging:  Medical clearance labs ordered, with following pertinent results: UDS positive for cocaine, THC, and amphetamines.  Metabolic panel with mild hyponatremia.  Normal hepatic and renal function.  ETOH, acetaminophen, and ASA.  Chest x-ray without evidence of cardiopulmonary disease.    Cardiac monitoring: EKG obtained and interpreted by attending physician which shows: sinus rhythm without infarction or ischemia.    Medications: I ordered medication including Ativan  for panic attack/anxiety symptoms. I have reviewed the patients home medicines and have made adjustments as needed.  Disposition: Patient is otherwise medically cleared at this time pending medical clearance laboratory evaluation. Will consult TTS and appreciate their recommendations.  I discussed this case with my attending physician Dr. Jearld Fenton who cosigned this note including patient's presenting symptoms, physical exam, and planned diagnostics and interventions. Attending physician stated agreement with plan or made changes to plan which were implemented.         Final Clinical Impression(s) / ED Diagnoses Final diagnoses:  Suicidal ideations  Panic attack    Rx / DC  Orders ED Discharge Orders     None         Lenard Simmer, PA-C 09/20/22 2259    Loetta Rough, MD 09/23/22 1319

## 2022-09-21 DIAGNOSIS — F329 Major depressive disorder, single episode, unspecified: Secondary | ICD-10-CM | POA: Diagnosis not present

## 2022-09-21 MED ORDER — DIAZEPAM 5 MG PO TABS
5.0000 mg | ORAL_TABLET | Freq: Once | ORAL | Status: AC
  Administered 2022-09-21: 5 mg via ORAL
  Filled 2022-09-21: qty 1

## 2022-09-21 NOTE — Progress Notes (Signed)
Received report from [place] [employer]

## 2022-09-21 NOTE — ED Notes (Signed)
Pt aware of pending transfer to [facility].

## 2022-09-21 NOTE — ED Notes (Signed)
Unable to give report at this time.

## 2022-09-21 NOTE — ED Notes (Signed)
Pt c/o to sitter of difficulty breathing. Pt states he may be having a seizure. Voiced to pt that there was no seizure activity noted. Pt states possible anxiety. Dr Wallace Cullens aware, new med ordered. Vital signs noted and within normal  limits.

## 2022-09-21 NOTE — Progress Notes (Signed)
Pt was accepted to [facility] TODAY 09/21/2022; Bed Assignment- to be assigned by charge nurse. Pending SIGNED Vol consent faxed to [facility] at [phone].  DX: MDD  Pt meets inpatient criteria per Burnetta Sabin, [facility]   Attending Physician will be Dr. Toni Amend  Report can be called to: [phone]  Pt can arrive after: ROOM IS READY NOW; however if pt cannot be transported at present, please arrange for 8:30 admission so that pt can be admitted following change of shift. Provider, please include admission orders and agitation protocol.  Care Team notified: [facility] AC Sharyne Peach, RN, Bobette Mo, RN, Gerrit Halls, RN, Claudia Desanctis, RN, Berkley Harvey, RN, Doyce Para, RN   Arliss Journey MSW, [facility] 09/21/2022 @ 5:05 PM

## 2022-09-21 NOTE — ED Provider Notes (Signed)
Emergency Medicine Observation Re-evaluation Raymond [name] is a 42 y.o. male, seen on rounds today.  Pt initially presented to the ED for complaints of Suicide attempt with pills Currently, the patient is not having any acute complaints.  Physical Exam  BP 109/69 (BP Location: Right Arm)   Pulse 84   Temp 98.4 F (36.9 C) (Oral)   Resp 20   SpO2 100%  Physical Exam General: Awake resting comfortably Lungs: Normal work of breathing Psych: Calm and cooperative  ED Course / MDM  EKG:   I have reviewed the labs performed to date as well as medications administered while in observation.  Recent changes in the last 24 hours include seen by TTS who recommends inpatient management.  Plan  Current plan is for placement.    Rondel Baton, MD 09/21/22 1136

## 2022-09-22 ENCOUNTER — Inpatient Hospital Stay
Admission: AD | Admit: 2022-09-22 | Discharge: 2022-09-26 | DRG: 880 | Disposition: A | Discharge status: Home / Self Care | Payer: No Typology Code available for payment source | Source: Intra-hospital | Attending: Psychiatry | Admitting: Psychiatry

## 2022-09-22 ENCOUNTER — Encounter: Payer: Self-pay | Admitting: Psychiatry

## 2022-09-22 ENCOUNTER — Other Ambulatory Visit: Payer: Self-pay

## 2022-09-22 DIAGNOSIS — Z87820 Personal history of traumatic brain injury: Secondary | ICD-10-CM

## 2022-09-22 DIAGNOSIS — F17210 Nicotine dependence, cigarettes, uncomplicated: Secondary | ICD-10-CM | POA: Diagnosis present

## 2022-09-22 DIAGNOSIS — Z9151 Personal history of suicidal behavior: Secondary | ICD-10-CM | POA: Diagnosis not present

## 2022-09-22 DIAGNOSIS — F4310 Post-traumatic stress disorder, unspecified: Secondary | ICD-10-CM | POA: Diagnosis present

## 2022-09-22 DIAGNOSIS — G40909 Epilepsy, unspecified, not intractable, without status epilepticus: Secondary | ICD-10-CM

## 2022-09-22 DIAGNOSIS — S069XAA Unspecified intracranial injury with loss of consciousness status unknown, initial encounter: Secondary | ICD-10-CM | POA: Diagnosis not present

## 2022-09-22 DIAGNOSIS — F1410 Cocaine abuse, uncomplicated: Secondary | ICD-10-CM | POA: Insufficient documentation

## 2022-09-22 DIAGNOSIS — F329 Major depressive disorder, single episode, unspecified: Secondary | ICD-10-CM | POA: Diagnosis present

## 2022-09-22 DIAGNOSIS — F1510 Other stimulant abuse, uncomplicated: Secondary | ICD-10-CM | POA: Diagnosis present

## 2022-09-22 DIAGNOSIS — Z79899 Other long term (current) drug therapy: Secondary | ICD-10-CM

## 2022-09-22 DIAGNOSIS — R45851 Suicidal ideations: Secondary | ICD-10-CM | POA: Diagnosis present

## 2022-09-22 DIAGNOSIS — F1010 Alcohol abuse, uncomplicated: Secondary | ICD-10-CM | POA: Insufficient documentation

## 2022-09-22 MED ORDER — LEVETIRACETAM 500 MG PO TABS
500.0000 mg | ORAL_TABLET | Freq: Two times a day (BID) | ORAL | Status: DC
  Administered 2022-09-22 – 2022-09-26 (×8): 500 mg via ORAL
  Filled 2022-09-22 (×9): qty 1

## 2022-09-22 MED ORDER — ZIPRASIDONE MESYLATE 20 MG IM SOLR: 10.0000 mg | Freq: Once | INTRAMUSCULAR | Status: DC

## 2022-09-22 MED ORDER — ACETAMINOPHEN 325 MG PO TABS: 650.0000 mg | ORAL_TABLET | Freq: Four times a day (QID) | ORAL | Status: DC | PRN

## 2022-09-22 MED ORDER — TRAZODONE HCL 50 MG PO TABS
50.0000 mg | ORAL_TABLET | Freq: Every evening | ORAL | Status: DC | PRN
  Administered 2022-09-25: 50 mg via ORAL
  Filled 2022-09-22: qty 1

## 2022-09-22 MED ORDER — DIPHENHYDRAMINE HCL 25 MG PO CAPS
50.0000 mg | ORAL_CAPSULE | Freq: Three times a day (TID) | ORAL | Status: DC | PRN
  Administered 2022-09-25: 50 mg via ORAL
  Filled 2022-09-22: qty 2

## 2022-09-22 MED ORDER — ALUM & MAG HYDROXIDE-SIMETH 200-200-20 MG/5ML PO SUSP: 30.0000 mL | ORAL | Status: DC | PRN

## 2022-09-22 MED ORDER — ALBUTEROL SULFATE HFA 108 (90 BASE) MCG/ACT IN AERS: 2.0000 | INHALATION_SPRAY | RESPIRATORY_TRACT | Status: DC | PRN

## 2022-09-22 MED ORDER — DIPHENHYDRAMINE HCL 50 MG/ML IJ SOLN: 50.0000 mg | Freq: Three times a day (TID) | INTRAMUSCULAR | Status: DC | PRN

## 2022-09-22 MED ORDER — MAGNESIUM HYDROXIDE 400 MG/5ML PO SUSP: 30.0000 mL | Freq: Every day | ORAL | Status: DC | PRN

## 2022-09-22 NOTE — ED Notes (Signed)
EDP rounding 

## 2022-09-22 NOTE — Group Note (Signed)
Date:  09/22/2022 Time:  4:36 PM  Group Topic/Focus:  Activity Group    Participation Level:  Active  Participation Quality:  Appropriate  Affect:  Appropriate  Cognitive:  Appropriate  Insight: Appropriate  Engagement in Group:  Engaged  Modes of Intervention:  Activity  Additional Comments:    Kadence Mikkelson Travis Sitara Cashwell 09/22/2022, 4:36 PM  

## 2022-09-22 NOTE — Group Note (Signed)
Recreation Therapy Group Note   Group Topic:Goal Setting  Group Date: 09/22/2022 Start Time: 1015 End Time: 1130 Facilitators: Clinton Gallant, CTRS Location: Craft Room  Group Description: Vision Board. Patients were given many different magazines, a glue stick, markers, and a piece of cardstock paper. LRT and pts discussed the importance of having goals in life. LRT and pts discussed the difference between short-term and long-term goals, as well as what a SMART goal is. LRT encouraged pts to create a vision board, with images they picked and then cut out by LRT from the magazine, for themselves, that capture their short and long-term goals. On the back of the paper, pt encouraged to write 3 different coping skills that can help them reach those goals. LRT encouraged pts to show and explain their vision board to the group. LRT offered to laminate vision board once dry and complete.   Goal Area(s) Addressed:  Patient will gain knowledge of short vs. long term goals.  Patient will identify goals for themselves. Patient will practice setting SMART goals.  Affect/Mood: N/A   Participation Level: Did not attend    Clinical Observations/Individualized Feedback: Raymond [name] did not attend group due to not being on the unit yet.   Plan: Continue to engage patient in RT group sessions 2-3x/week.   Raymond [name], LRT, CTRS 09/22/2022 12:33 PM

## 2022-09-22 NOTE — ED Provider Notes (Signed)
Emergency Medicine Observation Re-evaluation Note  Raymond [name] is a 42 y.o. male, seen on rounds today.  Pt initially presented to the ED for complaints of Suicidal Currently, the patient is asleep.  Pt accepted to [facility], but we are still awaiting transport.    Physical Exam  BP 113/76 (BP Location: Left Arm)   Pulse (!) 53   Temp 98 F (36.7 C) (Oral)   Resp 16   SpO2 96%  Physical Exam General: asleep Cardiac: rr Lungs: clear Psych: asleep  ED Course / MDM  EKG:EKG Interpretation  Date/Time:  Saturday September 20 2022 18:30:21 EDT Ventricular Rate:  79 PR Interval:  158 QRS Duration: 102 QT Interval:  406 QTC Calculation: 465 R Axis:   8 Text Interpretation: Normal sinus rhythm Incomplete right bundle branch block Borderline ECG When compared with ECG of 22-Nov-2021 08:24, PREVIOUS ECG IS PRESENT Confirmed by Margarita Grizzle [phone]) on 09/21/2022 6:12:39 PM  I have reviewed the labs performed to date as well as medications administered while in observation.  Recent changes in the last 24 hours include acceptance at [facility].  Plan  Current plan is for inpatient psych admission.    Jacalyn Lefevre, MD 09/22/22 [phone]

## 2022-09-22 NOTE — ED Notes (Signed)
Deputy given all belongings, clothes, wallet, phone, meds, bracelets. Pt calmer, cooperative, steady gait out with deputies. Plan and process explained, pt agreeable.

## 2022-09-22 NOTE — Progress Notes (Signed)
Admission Note:   Report was received from Jonny Ruiz, [place] on a 42 year-old male who presents IVC in no acute distress for the treatment of SI and Depression. Patient appears fidgety and anxious. Patient was calm and cooperative with admission process. Patient stated that the reason he came to the hospital is because he was "dizzy, nauseous, and throwing up, and I haven't taken my meds in four days". Patient was preoccupied with getting his phone charged and calling his landlord so that he won't lose his apartment. Patient endorsed both depression and anxiety, rating them a "5/10", stating "once I call these people and talk to Eunice Blase, I'll be a lot better". Patient denies SI/HI/AVH and pain to this writer, but then stated "I talk to my Mom sometimes", and patient reports that she passed away two years ago. Patient has a past medical history of Anxiety, PTSD, Seizures, Asthma, Depression, and Hepatitis C. Skin was assessed with Wylene Men, RN and found to be clear of any abnormal marks apart from healed scars to his left forearm, left upper lip, and right cheek, from a past surgery; patient also has some redness to his back and top of his head; with dry, callused feet. Patient searched and no contraband found and unit policies explained and understanding verbalized. Consents obtained. Food and fluids offered, and both accepted. Patient had no additional questions or concerns to voice to this Clinical research associate. Patient remains safe on the unit at this time.

## 2022-09-22 NOTE — Tx Team (Signed)
Initial Treatment Plan 09/22/2022 3:36 PM [name] ZOX:[id number]    PATIENT STRESSORS: Financial difficulties   Health problems   Medication change or noncompliance     PATIENT STRENGTHS: Communication skills  General fund of knowledge  Motivation for treatment/growth    PATIENT IDENTIFIED PROBLEMS: Health issues  Depression  Anxiety  Medication noncompliance  PTSD             DISCHARGE CRITERIA:  Ability to meet basic life and health needs Improved stabilization in mood, thinking, and/or behavior Medical problems require only outpatient monitoring Need for constant or close observation no longer present Reduction of life-threatening or endangering symptoms to within safe limits  PRELIMINARY DISCHARGE PLAN: Outpatient therapy Return to previous living arrangement  PATIENT/FAMILY INVOLVEMENT: This treatment plan has been presented to and reviewed with the patient, [name]. The patient has been given the opportunity to ask questions and make suggestions.  Fanny Agan, RN 09/22/2022, 3:36 PM

## 2022-09-22 NOTE — ED Notes (Signed)
Pending arrival of [employer] transport to [facility] for inpt psych for [employer]

## 2022-09-22 NOTE — ED Notes (Signed)
Pt verbalizes, "need to contact my landlord, so I don't lose my apartment, I'm going to leave bro, I don't care, just give me my phone, that's it, Im not a hostage, you can't hold me like a hostage.Marland KitchenMarland Kitchen"

## 2022-09-22 NOTE — Plan of Care (Signed)
New admission.  Problem: Education: Goal: Knowledge of General Education information will improve Description: Including pain rating scale, medication(s)/side effects and non-pharmacologic comfort measures Outcome: Not Progressing   Problem: Health Behavior/Discharge Planning: Goal: Ability to manage health-related needs will improve Outcome: Not Progressing   Problem: Clinical Measurements: Goal: Ability to maintain clinical measurements within normal limits will improve Outcome: Not Progressing Goal: Will remain free from infection Outcome: Not Progressing Goal: Diagnostic test results will improve Outcome: Not Progressing Goal: Respiratory complications will improve Outcome: Not Progressing Goal: Cardiovascular complication will be avoided Outcome: Not Progressing   Problem: Activity: Goal: Risk for activity intolerance will decrease Outcome: Not Progressing   Problem: Nutrition: Goal: Adequate nutrition will be maintained Outcome: Not Progressing   Problem: Coping: Goal: Level of anxiety will decrease Outcome: Not Progressing   Problem: Elimination: Goal: Will not experience complications related to bowel motility Outcome: Not Progressing Goal: Will not experience complications related to urinary retention Outcome: Not Progressing   Problem: Pain Managment: Goal: General experience of comfort will improve Outcome: Not Progressing   Problem: Safety: Goal: Ability to remain free from injury will improve Outcome: Not Progressing   Problem: Skin Integrity: Goal: Risk for impaired skin integrity will decrease Outcome: Not Progressing   Problem: Education: Goal: Knowledge of [facility] Education information/materials will improve Outcome: Not Progressing Goal: Emotional status will improve Outcome: Not Progressing Goal: Mental status will improve Outcome: Not Progressing Goal: Verbalization of understanding the information provided will  improve Outcome: Not Progressing   Problem: Safety: Goal: Periods of time without injury will increase Outcome: Not Progressing   Problem: Medication: Goal: Compliance with prescribed medication regimen will improve Outcome: Not Progressing   Problem: Self-Concept: Goal: Ability to disclose and discuss suicidal ideas will improve Outcome: Not Progressing Goal: Will verbalize positive feelings about self Outcome: Not Progressing   Problem: Coping: Goal: Coping ability will improve Outcome: Not Progressing Goal: Will verbalize feelings Outcome: Not Progressing   Problem: Self-Concept: Goal: Will verbalize positive feelings about self Outcome: Not Progressing Goal: Level of anxiety will decrease Outcome: Not Progressing   Problem: Self-Concept: Goal: Level of anxiety will decrease Outcome: Not Progressing

## 2022-09-22 NOTE — ED Notes (Signed)
ETA  1 Hour

## 2022-09-22 NOTE — Group Note (Signed)
[facility] LCSW Group Therapy Note    Group Date: 09/22/2022 Start Time: 1315 End Time: 1409  Type of Therapy and Topic:  Group Therapy:  Overcoming Obstacles  Participation Level:  [facility] PARTICIPATION LEVEL: Did Not Attend   Description of Group:   In this group patients will be encouraged to explore what they see as obstacles to their own wellness and recovery. They will be guided to discuss their thoughts, feelings, and behaviors related to these obstacles. The group will process together ways to cope with barriers, with attention given to specific choices patients can make. Each patient will be challenged to identify changes they are motivated to make in order to overcome their obstacles. This group will be process-oriented, with patients participating in exploration of their own experiences as well as giving and receiving support and challenge from other group members.  Therapeutic Goals: 1. Patient will identify personal and current obstacles as they relate to admission. 2. Patient will identify barriers that currently interfere with their wellness or overcoming obstacles.  3. Patient will identify feelings, thought process and behaviors related to these barriers. 4. Patient will identify two changes they are willing to make to overcome these obstacles:    Summary of Patient Progress X   Therapeutic Modalities:   Cognitive Behavioral Therapy Solution Focused Therapy Motivational Interviewing Relapse Prevention Therapy   Glenis Smoker, LCSW

## 2022-09-22 NOTE — ED Notes (Addendum)
Up to b/r, steady gait, back to stretcher, sitter present. Pt with persistent whimper/ gasp/ clearing of throat. Appears chronic habitual/ behavioral. LS CTA. Pt verbalizes "wants to go with nurse back to his house to get phone charger, not being admitted, wants to leave, will walk out, can't make me stay or be admitted". IVC inpt psych transfer explained with rationale.

## 2022-09-22 NOTE — H&P (Signed)
Psychiatric Admission Assessment Adult  Patient Identification: Raymond [name] MRN:  [id number] Date of Evaluation:  09/22/2022 Chief Complaint:  MDD (major depressive disorder) [F32.9] Principal Diagnosis: Traumatic brain injury Diagnosis:  Principal Problem:   Traumatic brain injury Active Problems:   Seizure disorder   Cocaine abuse   Amphetamine abuse  History of Present Illness: Patient seen and chart reviewed.  This is a 42 year old man previously unknown to our system who was transferred to [place] from [place].  The history remains a little bit vague in part because of what is available in the chart and also because of some of the peculiarities and how he gives information.  Patient went to the emergency room in [place] with a complaint that he had had a seizure.  It is not clear if any epileptiform activity was actually witnessed by any clinical provider.  Sometime along the line he started telling them that he had tried to kill himself by overdosing on medicines.  I am not sure he ever specified what drugs he had taken.  When psychiatry saw him he reportedly again endorsed suicidal ideation.  In interview with me the patient denies having done anything to try to kill himself denies suicidal ideation and says that he only said that because he thought it would get him attention.  He tells me that his chief concern is that he had a seizure because he is not taking his seizure medicine.  He tells me that he has been diagnosed with seizures, panic attacks, depression, PTSD and a traumatic brain injury but is not currently receiving any medication treatment for any of that.  He is living in [place] by himself.  He has a therapist he sees regularly.  He was vague in describing that and by reading the chart I discovered it looks like he goes to a [place] clinic for group therapy periodically.  In those notes that does not give him any particular psychiatric diagnosis but only gives him a diagnosis of  homelessness.  Patient denies any suicidal ideation now.  Denies any psychotic symptoms.  He says that he does not drink alcohol and has not done so for over a month and he does not use any other drugs.  His drug screen on presentation however is positive for amphetamines and cocaine.  It was notable that he had a rather peculiar behavior that was hard to categorize.  Constant vocalizations that seemed possibly like Tourette's tics.  He said that he was wheezing but it sounded more like a hiccup sound. Associated Signs/Symptoms: Depression Symptoms:  difficulty concentrating, suicidal thoughts without plan, panic attacks, (Hypo) Manic Symptoms:  Impulsivity, Anxiety Symptoms:  Excessive Worry, Psychotic Symptoms:   Does not appear to be having active psychotic symptoms PTSD Symptoms: Patient claims to have a diagnosis of PTSD although that is not listed on his VA paperwork and it is not clear what if any of his symptoms would be consistent with that. Total Time spent with patient: 45 minutes  Past Psychiatric History: Patient tells me that he had 1 suicide attempt but it was 15 years ago.  He claims he has not had any psychiatric hospitalizations.  He tells me multiple diagnoses including panic disorder depression and PTSD but I do not see any clear documentation of those.  He tells me however that he has a traumatic brain injury and has seizures at times.  Patient currently presents as friendly outgoing somewhat disorganized but not bizarre ultimately cooperative.  His drug screen as mentioned  is positive.  He claims he has not been on any psychiatric medicines in the past.  When I reviewed the medicines listed in his chart as being past medicines however he said that he "takes everything" but could not be particular about any of them.  Is the patient at risk to self? No.  Has the patient been a risk to self in the past 6 months? No.  Has the patient been a risk to self within the distant past? Yes.     Is the patient a risk to others? No.  Has the patient been a risk to others in the past 6 months? No.  Has the patient been a risk to others within the distant past? No.   [place] Scale:  Flowsheet Row ED from 09/20/2022 in [facility] Emergency Department at [facility] ED from 11/22/2021 in [facility] Emergency Department at [facility] ED from 11/13/2021 in [facility] Emergency Department at [facility]  C-SSRS RISK CATEGORY High Risk Low Risk No Risk        Prior Inpatient Therapy: No. If yes, describe no Prior Outpatient Therapy: Yes.   If yes, describe details lacking all I know for sure as he has been going to a therapy group for homeless people  Alcohol Screening:   Substance Abuse History in the last 12 months:  Yes.   Consequences of Substance Abuse: He admits to alcohol use within the last 6 months or so but his drug screen is positive for several other things. Previous Psychotropic Medications: Yes  Psychological Evaluations: Yes  Past Medical History:  Past Medical History:  Diagnosis Date   Anxiety    Hepatitis C    PTSD (post-traumatic stress disorder)     Past Surgical History:  Procedure Laterality Date   FACIAL FRACTURE SURGERY     metal plate under right eye   Family History: No family history on file. Family Psychiatric  History: Denies knowing of any Tobacco Screening:  Social History   Tobacco Use  Smoking Status Some Days   Types: Cigarettes  Smokeless Tobacco Not on file    BH Tobacco Counseling     Are you interested in Tobacco Cessation Medications?  No value filed. Counseled patient on smoking cessation:  No value filed. Reason Tobacco Screening Not Completed: No value filed.       Social History:  Social History   Substance and Sexual Activity  Alcohol Use Not Currently     Social History   Substance and Sexual Activity  Drug Use Not Currently    Additional Social History:                            Allergies:   Allergies  Allergen Reactions   Other     Peanuts   Lab Results:  Results for orders placed or performed during the hospital encounter of 09/20/22 (from the past 48 hour(s))  Rapid urine drug screen (hospital performed)     Status: Abnormal   Collection Time: 09/20/22  2:57 PM  Result Value Ref Range   Opiates NONE DETECTED NONE DETECTED   Cocaine POSITIVE (A) NONE DETECTED   Benzodiazepines NONE DETECTED NONE DETECTED   Amphetamines POSITIVE (A) NONE DETECTED   Tetrahydrocannabinol POSITIVE (A) NONE DETECTED   Barbiturates NONE DETECTED NONE DETECTED    Comment: (NOTE) DRUG SCREEN FOR MEDICAL PURPOSES ONLY.  IF CONFIRMATION IS NEEDED FOR ANY PURPOSE, NOTIFY LAB  WITHIN 5 DAYS.  LOWEST DETECTABLE LIMITS FOR URINE DRUG SCREEN Drug Class                     Cutoff (ng/mL) Amphetamine and metabolites    1000 Barbiturate and metabolites    200 Benzodiazepine                 200 Opiates and metabolites        300 Cocaine and metabolites        300 THC                            50 Performed at [facility], [address], [place], [place] [address]   Comprehensive metabolic panel     Status: Abnormal   Collection Time: 09/20/22  4:21 PM  Result Value Ref Range   Sodium 134 (L) 135 - 145 mmol/L   Potassium 3.5 3.5 - 5.1 mmol/L   Chloride 103 98 - 111 mmol/L   CO2 19 (L) 22 - 32 mmol/L   Glucose, Bld 89 70 - 99 mg/dL    Comment: Glucose reference range applies only to samples taken after fasting for at least 8 hours.   BUN 8 6 - 20 mg/dL   Creatinine, Ser 2.95 0.61 - 1.24 mg/dL   Calcium 9.2 8.9 - 62.1 mg/dL   Total Protein 7.8 6.5 - 8.1 g/dL   Albumin 4.6 3.5 - 5.0 g/dL   AST 31 15 - 41 U/L   ALT 32 0 - 44 U/L   Alkaline Phosphatase 124 38 - 126 U/L   Total Bilirubin 1.7 (H) 0.3 - 1.2 mg/dL   GFR, Estimated >30 >86 mL/min    Comment: (NOTE) Calculated using the CKD-EPI Creatinine Equation (2021)    Anion gap 12 5 - 15    Comment:  Performed at [facility], [address], [place], [place] [address]  Ethanol     Status: None   Collection Time: 09/20/22  4:21 PM  Result Value Ref Range   Alcohol, Ethyl (B) <10 <10 mg/dL    Comment: (NOTE) Lowest detectable limit for serum alcohol is 10 mg/dL.  For medical purposes only. Performed at [facility], [address] [place], [place] [address]   Salicylate level     Status: Abnormal   Collection Time: 09/20/22  4:21 PM  Result Value Ref Range   Salicylate Lvl <7.0 (L) 7.0 - 30.0 mg/dL    Comment: Performed at [facility], [address], [place], [place] [address]  Acetaminophen level     Status: Abnormal   Collection Time: 09/20/22  4:21 PM  Result Value Ref Range   Acetaminophen (Tylenol), Serum <10 (L) 10 - 30 ug/mL    Comment: (NOTE) Therapeutic concentrations vary significantly. A range of 10-30 ug/mL  may be an effective concentration for many patients. However, some  are best treated at concentrations outside of this range. Acetaminophen concentrations >150 ug/mL at 4 hours after ingestion  and >50 ug/mL at 12 hours after ingestion are often associated with  toxic reactions.  Performed at [facility], [address], [place], [place] [address]   cbc     Status: None   Collection Time: 09/20/22  4:21 PM  Result Value Ref Range   WBC 7.9 4.0 - 10.5 K/uL   RBC 5.21 4.22 - 5.81 MIL/uL   Hemoglobin 16.3  13.0 - 17.0 g/dL   HCT 40.9 81.1 - 91.4 %   MCV 88.3 80.0 - 100.0 fL   MCH 31.3 26.0 - 34.0 pg   MCHC 35.4 30.0 - 36.0 g/dL   RDW 78.2 95.6 - 21.3 %   Platelets 266 150 - 400 K/uL   nRBC 0.0 0.0 - 0.2 %    Comment: Performed at [facility], [address], [place], [place] [address]    Blood Alcohol level:  Lab Results  Component Value Date   ETH <10 09/20/2022    Metabolic Disorder Labs:  No results found for: "HGBA1C", "MPG" No results found for: "PROLACTIN" No results found for: "CHOL", "TRIG", "HDL",  "CHOLHDL", "VLDL", "LDLCALC"  Current Medications: Current Facility-Administered Medications  Medication Dose Route Frequency Provider Last Rate Last Admin   acetaminophen (TYLENOL) tablet 650 mg  650 mg Oral Q6H PRN Oneta Rack, NP       albuterol (VENTOLIN HFA) 108 (90 Base) MCG/ACT inhaler 2 puff  2 puff Inhalation Q4H PRN Cyanna Neace, Jackquline Denmark, MD       alum & mag hydroxide-simeth (MAALOX/MYLANTA) 200-200-20 MG/5ML suspension 30 mL  30 mL Oral Q4H PRN Oneta Rack, NP       diphenhydrAMINE (BENADRYL) capsule 50 mg  50 mg Oral TID PRN Oneta Rack, NP       Or   diphenhydrAMINE (BENADRYL) injection 50 mg  50 mg Intramuscular TID PRN Oneta Rack, NP       levETIRAcetam (KEPPRA) tablet 500 mg  500 mg Oral BID Tanush Drees, Jackquline Denmark, MD       magnesium hydroxide (MILK OF MAGNESIA) suspension 30 mL  30 mL Oral Daily PRN Oneta Rack, NP       traZODone (DESYREL) tablet 50 mg  50 mg Oral QHS PRN Oneta Rack, NP       PTA Medications: Medications Prior to Admission  Medication Sig Dispense Refill Last Dose   albuterol (VENTOLIN HFA) 108 (90 Base) MCG/ACT inhaler SMARTSIG:2 Puff(s) Via Inhaler 4 Times Daily PRN (Patient not taking: Reported on 11/22/2021)      amoxicillin-clavulanate (AUGMENTIN) 875-125 MG tablet Take 1 tablet by mouth every 12 (twelve) hours. (Patient not taking: Reported on 09/22/2022) 14 tablet 0 Completed Course   ARIPiprazole (ABILIFY) 2 MG tablet Take 2 mg by mouth daily. (Patient not taking: Reported on 09/22/2022)   Not Taking   atomoxetine (STRATTERA) 40 MG capsule Take 40 mg by mouth daily. (Patient not taking: Reported on 09/22/2022)   Not Taking   azithromycin (ZITHROMAX) 250 MG tablet Take 1 tablet (250 mg total) by mouth daily. Take first 2 tablets together, then 1 every day until finished. (Patient not taking: Reported on 09/22/2022) 6 tablet 0 Completed Course   hydrOXYzine (ATARAX) 25 MG tablet Take 25 mg by mouth 3 (three) times daily as needed for anxiety.  (Patient not taking: Reported on 09/22/2022)   Not Taking   prazosin (MINIPRESS) 1 MG capsule Take 1 mg by mouth at bedtime. (Patient not taking: Reported on 09/22/2022)   Not Taking   predniSONE (DELTASONE) 20 MG tablet Take 40 mg by mouth daily. 5 day course. (Patient not taking: Reported on 11/22/2021)   Not Taking   sertraline (ZOLOFT) 25 MG tablet Take 25 mg by mouth daily. (Patient not taking: Reported on 09/22/2022)   Not Taking   traZODone (DESYREL) 50 MG tablet Take 100 mg by mouth at bedtime. (Patient not taking: Reported on 09/22/2022)  Not Taking    Musculoskeletal: Strength & Muscle Tone: within normal limits Gait & Station: normal Patient leans: N/A            Psychiatric Specialty Exam:  Presentation  General Appearance: No data recorded Eye Contact:No data recorded Speech:No data recorded Speech Volume:No data recorded Handedness:No data recorded  Mood and Affect  Mood:No data recorded Affect:No data recorded  Thought Process  Thought Processes:No data recorded Duration of Psychotic Symptoms:N/A Past Diagnosis of Schizophrenia or Psychoactive disorder: No  Descriptions of Associations:No data recorded Orientation:No data recorded Thought Content:No data recorded Hallucinations:No data recorded Ideas of Reference:No data recorded Suicidal Thoughts:No data recorded Homicidal Thoughts:No data recorded  Sensorium  Memory:No data recorded Judgment:No data recorded Insight:No data recorded  Executive Functions  Concentration:No data recorded Attention Span:No data recorded Recall:No data recorded Fund of Knowledge:No data recorded Language:No data recorded  Psychomotor Activity  Psychomotor Activity:No data recorded  Assets  Assets:No data recorded  Sleep  Sleep:No data recorded   Physical Exam: Physical Exam Vitals and nursing note reviewed.  Constitutional:      Appearance: Normal appearance.  HENT:     Head: Normocephalic and  atraumatic.     Mouth/Throat:     Pharynx: Oropharynx is clear.  Eyes:     Pupils: Pupils are equal, round, and reactive to light.  Cardiovascular:     Rate and Rhythm: Normal rate and regular rhythm.  Pulmonary:     Effort: Pulmonary effort is normal.     Breath sounds: Normal breath sounds.  Abdominal:     General: Abdomen is flat.     Palpations: Abdomen is soft.  Musculoskeletal:        General: Normal range of motion.  Skin:    General: Skin is warm and dry.  Neurological:     General: No focal deficit present.     Mental Status: He is alert. Mental status is at baseline.  Psychiatric:        Attention and Perception: Attention normal.        Mood and Affect: Mood normal. Affect is labile.        Speech: Speech is tangential.        Behavior: Behavior is cooperative.        Thought Content: Thought content normal. Thought content does not include homicidal or suicidal ideation.        Cognition and Memory: Cognition is impaired.    Review of Systems  Constitutional: Negative.   HENT: Negative.    Eyes: Negative.   Respiratory:  Positive for cough and shortness of breath.   Cardiovascular: Negative.   Gastrointestinal: Negative.   Musculoskeletal: Negative.   Skin: Negative.   Neurological: Negative.   Psychiatric/Behavioral: Negative.     Blood pressure (!) 133/110, pulse 79, temperature 98 F (36.7 C), temperature source Oral, resp. rate 18, height 6\' 1"  (1.854 m), weight 76.2 kg, SpO2 99 %. Body mass index is 22.16 kg/m.  Treatment Plan Summary: Plan unclear situation.  In interview with me he was extremely clear that he did not have any suicidal thoughts at all.  His affect was upbeat.  It is a little bit hard to make sense of the whole story.  I suspect the traumatic brain injury part is probably correct and that would be consistent with the seizures.  He says he used to take Keppra but has not been able to afford it recently.  I am going to restart him on  Keppra 500  mg twice a day for the supposed seizures.  Right now he is not reporting other psychiatric symptoms that would require particular medications.  If his symptoms do not change dramatically I expect we will likely be discharging him tomorrow.  Observation Level/Precautions:  15 minute checks  Laboratory:  Chemistry Profile  Psychotherapy:    Medications:    Consultations:    Discharge Concerns:    Estimated LOS:  Other:     Physician Treatment Plan for Primary Diagnosis: Traumatic brain injury Long Term Goal(s): Improvement in symptoms so as ready for discharge  Short Term Goals: Ability to verbalize feelings will improve and Ability to demonstrate self-control will improve  Physician Treatment Plan for Secondary Diagnosis: Principal Problem:   Traumatic brain injury Active Problems:   Seizure disorder   Cocaine abuse   Amphetamine abuse  Long Term Goal(s): Improvement in symptoms so as ready for discharge  Short Term Goals: Compliance with prescribed medications will improve  I certify that inpatient services furnished can reasonably be expected to improve the patient's condition.    Mordecai Rasmussen, MD 4/22/20242:29 PM

## 2022-09-22 NOTE — [facility] Suicide Risk Assessment (Signed)
[facility] Admission Suicide Risk Assessment   Nursing information obtained from:    Demographic factors:    Current Mental Status:    Loss Factors:    Historical Factors:    Risk Reduction Factors:     Total Time spent with patient: 45 minutes Principal Problem: Traumatic brain injury Diagnosis:  Principal Problem:   Traumatic brain injury Active Problems:   Seizure disorder   Cocaine abuse   Amphetamine abuse  Subjective Data: Patient seen and chart reviewed.  42 year old man transferred to [place] under IVC with report of suicidal ideation.  In interview with me the patient absolutely denies any suicidal thoughts at all.  He denies that he had actually tried to harm himself saying he only said that at the time because he wanted attention.  Denies any psychotic symptoms denies any mental health symptoms currently.  Continued Clinical Symptoms:    The "Alcohol Use Disorders Identification Test", Guidelines for Use in Primary Care, Second Edition.  World Science writer [facility]). Score between 0-7:  no or low risk or alcohol related problems. Score between 8-15:  moderate risk of alcohol related problems. Score between 16-19:  high risk of alcohol related problems. Score 20 or above:  warrants further diagnostic evaluation for alcohol dependence and treatment.   CLINICAL FACTORS:   Alcohol/Substance Abuse/Dependencies Medical Diagnoses and Treatments/Surgeries   Musculoskeletal: Strength & Muscle Tone: within normal limits Gait & Station: normal Patient leans: N/A  Psychiatric Specialty Exam:  Presentation  General Appearance: No data recorded Eye Contact:No data recorded Speech:No data recorded Speech Volume:No data recorded Handedness:No data recorded  Mood and Affect  Mood:No data recorded Affect:No data recorded  Thought Process  Thought Processes:No data recorded Descriptions of Associations:No data recorded Orientation:No data recorded Thought Content:No data  recorded History of Schizophrenia/Schizoaffective disorder:No  Duration of Psychotic Symptoms:No data recorded Hallucinations:No data recorded Ideas of Reference:No data recorded Suicidal Thoughts:No data recorded Homicidal Thoughts:No data recorded  Sensorium  Memory:No data recorded Judgment:No data recorded Insight:No data recorded  Executive Functions  Concentration:No data recorded Attention Span:No data recorded Recall:No data recorded Fund of Knowledge:No data recorded Language:No data recorded  Psychomotor Activity  Psychomotor Activity:No data recorded  Assets  Assets:No data recorded  Sleep  Sleep:No data recorded   Physical Exam: Physical Exam Vitals and nursing note reviewed.  HENT:     Head: Normocephalic and atraumatic.     Mouth/Throat:     Pharynx: Oropharynx is clear.  Eyes:     Pupils: Pupils are equal, round, and reactive to light.  Cardiovascular:     Rate and Rhythm: Normal rate and regular rhythm.  Pulmonary:     Effort: Pulmonary effort is normal.     Breath sounds: Normal breath sounds.  Abdominal:     General: Abdomen is flat.     Palpations: Abdomen is soft.  Musculoskeletal:        General: Normal range of motion.  Skin:    General: Skin is warm and dry.  Neurological:     General: No focal deficit present.     Mental Status: He is alert. Mental status is at baseline.  Psychiatric:        Attention and Perception: Attention normal.        Mood and Affect: Mood normal. Affect is labile.        Speech: Speech is tangential.        Behavior: Behavior is cooperative.        Thought Content: Thought content normal.  Thought content does not include homicidal or suicidal ideation.        Cognition and Memory: Cognition is impaired. Memory is impaired.        Judgment: Judgment is impulsive.    Review of Systems  Constitutional: Negative.   HENT: Negative.    Eyes: Negative.   Respiratory: Negative.    Cardiovascular: Negative.    Gastrointestinal: Negative.   Musculoskeletal: Negative.   Skin: Negative.   Neurological:  Positive for seizures.  Psychiatric/Behavioral: Negative.     Blood pressure (!) 133/110, pulse 79, temperature 98 F (36.7 C), temperature source Oral, resp. rate 18, height  (1.854 m), weight 76.2 kg, SpO2 99 %. Body mass index is 22.16 kg/m.   COGNITIVE FEATURES THAT CONTRIBUTE TO RISK:  Loss of executive function    SUICIDE RISK:   Minimal: No identifiable suicidal ideation.  Patients presenting with no risk factors but with morbid ruminations; may be classified as minimal risk based on the severity of the depressive symptoms  PLAN OF CARE: Continue 15-minute checks.  Engage in individual and group therapy.  Looks like we need to restart medicine for his seizures possibly also for blood pressure.  Not clear that he really needs inpatient psychiatric treatment.  Ongoing assessment of dangerousness prior to discharge  I certify that inpatient services furnished can reasonably be expected to improve the patient's condition.   Mordecai Rasmussen, MD 09/22/2022, 2:26 PM

## 2022-09-22 NOTE — Group Note (Signed)
Date:  09/22/2022 Time:  11:35 PM  Group Topic/Focus:  Wrap-Up Group:   The focus of this group is to help patients review their daily goal of treatment and discuss progress on daily workbooks.    Participation Level:  Active  Participation Quality:  Appropriate and Attentive  Affect:  Appropriate  Cognitive:  Appropriate  Insight: Appropriate  Engagement in Group:  Engaged  Modes of Intervention:  Activity  Additional Comments:     Maglione,Taino Maertens E 09/22/2022, 11:35 PM

## 2022-09-22 NOTE — ED Notes (Addendum)
IVC faxed to [facility], transport imminent. SW contacted, message left.

## 2022-09-23 DIAGNOSIS — S069XAA Unspecified intracranial injury with loss of consciousness status unknown, initial encounter: Secondary | ICD-10-CM | POA: Diagnosis not present

## 2022-09-23 NOTE — [facility] Suicide Risk Assessment (Signed)
[facility] INPATIENT:  Family/Significant Other Suicide Prevention Education  Suicide Prevention Education:  Patient Refusal for Family/Significant Other Suicide Prevention Education: The patient Raymond [name] has refused to provide written consent for family/significant other to be provided Family/Significant Other Suicide Prevention Education during admission and/or prior to discharge.  Physician notified.  Corky Crafts 09/23/2022, 4:00 PM

## 2022-09-23 NOTE — Progress Notes (Signed)
[facility] MD Progress Note  09/23/2022 10:53 AM Raymond [name]  MRN:  [id number] Subjective: Follow-up this patient with a history of traumatic brain injury and seizure disorder and possible substance abuse.  Patient says he is feeling much better today.  He has not had a seizure and is glad he is taking his Keppra.  He is participating in groups appropriately and interacting well with others.  While participating in groups he appears to be able to stay calm and quiet for the most part.  During one-on-one interview he continues to have verbal tics.  Denies psychotic symptoms.  Denies suicidal ideation. Principal Problem: Traumatic brain injury Diagnosis: Principal Problem:   Traumatic brain injury Active Problems:   Seizure disorder   Cocaine abuse   Amphetamine abuse  Total Time spent with patient: 30 minutes  Past Psychiatric History: Past history by his account of multiple diagnoses although traumatic brain injury seems to be the most relevant.  Past Medical History:  Past Medical History:  Diagnosis Date   Anxiety    Hepatitis C    PTSD (post-traumatic stress disorder)     Past Surgical History:  Procedure Laterality Date   FACIAL FRACTURE SURGERY     metal plate under right eye   Family History: History reviewed. No pertinent family history. Family Psychiatric  History: See previous Social History:  Social History   Substance and Sexual Activity  Alcohol Use Not Currently     Social History   Substance and Sexual Activity  Drug Use Not Currently    Social History   Socioeconomic History   Marital status: Single    Spouse name: Not on file   Number of children: Not on file   Years of education: Not on file   Highest education level: Not on file  Occupational History   Not on file  Tobacco Use   Smoking status: Some Days    Types: Cigarettes   Smokeless tobacco: Never  Vaping Use   Vaping Use: Never used  Substance and Sexual Activity   Alcohol use: Not Currently    Drug use: Not Currently   Sexual activity: Not on file  Other Topics Concern   Not on file  Social History Narrative   Not on file   Social Determinants of Health   Financial Resource Strain: Not on file  Food Insecurity: No Food Insecurity (09/22/2022)   Hunger Vital Sign    Worried About Running Out of Food in the Last Year: Never true    Ran Out of Food in the Last Year: Never true  Recent Concern: Food Insecurity - Food Insecurity Present (08/13/2022)   Hunger Vital Sign    Worried About Running Out of Food in the Last Year: Sometimes true    Ran Out of Food in the Last Year: Sometimes true  Transportation Needs: No Transportation Needs (09/22/2022)   PRAPARE - Administrator, Civil Service (Medical): No    Lack of Transportation (Non-Medical): No  Physical Activity: Not on file  Stress: Not on file  Social Connections: Not on file   Additional Social History:                         Sleep: Fair  Appetite:  Fair  Current Medications: Current Facility-Administered Medications  Medication Dose Route Frequency Provider Last Rate Last Admin   acetaminophen (TYLENOL) tablet 650 mg  650 mg Oral Q6H PRN Oneta Rack, NP  albuterol (VENTOLIN HFA) 108 (90 Base) MCG/ACT inhaler 2 puff  2 puff Inhalation Q4H PRN Jyll Tomaro, Jackquline Denmark, MD       alum & mag hydroxide-simeth (MAALOX/MYLANTA) 200-200-20 MG/5ML suspension 30 mL  30 mL Oral Q4H PRN Oneta Rack, NP       diphenhydrAMINE (BENADRYL) capsule 50 mg  50 mg Oral TID PRN Oneta Rack, NP       Or   diphenhydrAMINE (BENADRYL) injection 50 mg  50 mg Intramuscular TID PRN Oneta Rack, NP       levETIRAcetam (KEPPRA) tablet 500 mg  500 mg Oral BID Aeron Lheureux T, MD   500 mg at 09/23/22 1610   magnesium hydroxide (MILK OF MAGNESIA) suspension 30 mL  30 mL Oral Daily PRN Oneta Rack, NP       traZODone (DESYREL) tablet 50 mg  50 mg Oral QHS PRN Oneta Rack, NP        Lab Results: No  results found for this or any previous visit (from the past 48 hour(s)).  Blood Alcohol level:  Lab Results  Component Value Date   ETH <10 09/20/2022    Metabolic Disorder Labs: No results found for: "HGBA1C", "MPG" No results found for: "PROLACTIN" No results found for: "CHOL", "TRIG", "HDL", "CHOLHDL", "VLDL", "LDLCALC"  Physical Findings: AIMS:  , ,  ,  ,    CIWA:    COWS:     Musculoskeletal: Strength & Muscle Tone: within normal limits Gait & Station: normal Patient leans: N/A  Psychiatric Specialty Exam:  Presentation  General Appearance: No data recorded Eye Contact:No data recorded Speech:No data recorded Speech Volume:No data recorded Handedness:No data recorded  Mood and Affect  Mood:No data recorded Affect:No data recorded  Thought Process  Thought Processes:No data recorded Descriptions of Associations:No data recorded Orientation:No data recorded Thought Content:No data recorded History of Schizophrenia/Schizoaffective disorder:No  Duration of Psychotic Symptoms:No data recorded Hallucinations:No data recorded Ideas of Reference:No data recorded Suicidal Thoughts:No data recorded Homicidal Thoughts:No data recorded  Sensorium  Memory:No data recorded Judgment:No data recorded Insight:No data recorded  Executive Functions  Concentration:No data recorded Attention Span:No data recorded Recall:No data recorded Fund of Knowledge:No data recorded Language:No data recorded  Psychomotor Activity  Psychomotor Activity:No data recorded  Assets  Assets:No data recorded  Sleep  Sleep:No data recorded   Physical Exam: Physical Exam Vitals and nursing note reviewed.  Constitutional:      Appearance: Normal appearance.  HENT:     Head: Normocephalic and atraumatic.     Mouth/Throat:     Pharynx: Oropharynx is clear.  Eyes:     Pupils: Pupils are equal, round, and reactive to light.  Cardiovascular:     Rate and Rhythm: Normal rate and  regular rhythm.  Pulmonary:     Effort: Pulmonary effort is normal.     Breath sounds: Normal breath sounds.  Abdominal:     General: Abdomen is flat.     Palpations: Abdomen is soft.  Musculoskeletal:        General: Normal range of motion.  Skin:    General: Skin is warm and dry.  Neurological:     General: No focal deficit present.     Mental Status: He is alert. Mental status is at baseline.  Psychiatric:        Attention and Perception: Attention normal.        Mood and Affect: Mood normal.        Speech: Speech normal.  Behavior: Behavior is cooperative.        Thought Content: Thought content normal.        Cognition and Memory: Cognition normal.        Judgment: Judgment normal.    Review of Systems  Constitutional: Negative.   HENT: Negative.    Eyes: Negative.   Respiratory: Negative.    Cardiovascular: Negative.   Gastrointestinal: Negative.   Musculoskeletal: Negative.   Skin: Negative.   Neurological: Negative.   Psychiatric/Behavioral:  Negative for depression, hallucinations, substance abuse and suicidal ideas. The patient is nervous/anxious.    Blood pressure 114/82, pulse 68, temperature 98.2 F (36.8 C), temperature source Oral, resp. rate 16, height  (1.854 m), weight 76.2 kg, SpO2 97 %. Body mass index is 22.16 kg/m.   Treatment Plan Summary: Plan no change to medication.  Offered patient discharge today but he says he feels more comfortable staying "a day or 2".  We will reassess daily but plan on discharging no later than Thursday.  Mordecai Rasmussen, MD 09/23/2022, 10:53 AM

## 2022-09-23 NOTE — Plan of Care (Signed)
  Problem: Education: Goal: Knowledge of General Education information will improve Description: Including pain rating scale, medication(s)/side effects and non-pharmacologic comfort measures Outcome: Not Progressing   Problem: Health Behavior/Discharge Planning: Goal: Ability to manage health-related needs will improve Outcome: Not Progressing   Problem: Clinical Measurements: Goal: Ability to maintain clinical measurements within normal limits will improve Outcome: Not Progressing Goal: Will remain free from infection Outcome: Not Progressing Goal: Diagnostic test results will improve Outcome: Not Progressing Goal: Respiratory complications will improve Outcome: Not Progressing Goal: Cardiovascular complication will be avoided Outcome: Not Progressing   Problem: Activity: Goal: Risk for activity intolerance will decrease Outcome: Not Progressing   Problem: Nutrition: Goal: Adequate nutrition will be maintained Outcome: Not Progressing   Problem: Coping: Goal: Level of anxiety will decrease Outcome: Not Progressing   Problem: Elimination: Goal: Will not experience complications related to bowel motility Outcome: Not Progressing Goal: Will not experience complications related to urinary retention Outcome: Not Progressing   Problem: Pain Managment: Goal: General experience of comfort will improve Outcome: Not Progressing   Problem: Safety: Goal: Ability to remain free from injury will improve Outcome: Not Progressing   Problem: Skin Integrity: Goal: Risk for impaired skin integrity will decrease Outcome: Not Progressing   Problem: Education: Goal: Knowledge of [place] General Education information/materials will improve Outcome: Not Progressing Goal: Emotional status will improve Outcome: Not Progressing Goal: Mental status will improve Outcome: Not Progressing Goal: Verbalization of understanding the information provided will improve Outcome: Not  Progressing   Problem: Safety: Goal: Periods of time without injury will increase Outcome: Not Progressing   Problem: Medication: Goal: Compliance with prescribed medication regimen will improve Outcome: Not Progressing   Problem: Self-Concept: Goal: Ability to disclose and discuss suicidal ideas will improve Outcome: Not Progressing Goal: Will verbalize positive feelings about self Outcome: Not Progressing   Problem: Coping: Goal: Coping ability will improve Outcome: Not Progressing Goal: Will verbalize feelings Outcome: Not Progressing   Problem: Self-Concept: Goal: Will verbalize positive feelings about self Outcome: Not Progressing Goal: Level of anxiety will decrease Outcome: Not Progressing   Problem: Self-Concept: Goal: Level of anxiety will decrease Outcome: Not Progressing

## 2022-09-23 NOTE — Progress Notes (Signed)
D- Patient alert and oriented x 4 Affect blunted/mood anxious. Denies SI/ HI/ AVH. Patient denies pain. Patient endorses depression and anxiety. Planning for discharge but verbalized to MD that he was participating  in group and "wants to stay another day".  A- Scheduled medications administered to patient, per MD orders. Support and encouragement provided.  Routine safety checks conducted every 15 minutes without incident.  Patient informed to notify staff with problems or concerns and verbalizes understanding. R- No adverse drug reactions noted.  Patient compliant with medications and treatment plan. Patient receptive, calm cooperative and interacts well with others on the unit.  Patient contracts for safety and  remains safe on the unit at this time.
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
## 2022-09-23 NOTE — Group Note (Signed)
LCSW Group Therapy Note  Group Date: 09/23/2022 Start Time: 1300 End Time: 1400   Type of Therapy and Topic:  Group Therapy - Healthy vs Unhealthy Coping Skills  Participation Level:  Minimal   Description of Group The focus of this group was to determine what unhealthy coping techniques typically are used by group members and what healthy coping techniques would be helpful in coping with various problems. Patients were guided in becoming aware of the differences between healthy and unhealthy coping techniques. Patients were asked to identify 2-3 healthy coping skills they would like to learn to use more effectively.  Therapeutic Goals Patients learned that coping is what human beings do all day long to deal with various situations in their lives Patients defined and discussed healthy vs unhealthy coping techniques Patients identified their preferred coping techniques and identified whether these were healthy or unhealthy Patients determined 2-3 healthy coping skills they would like to become more familiar with and use more often. Patients provided support and ideas to each other   Summary of Patient Progress:   Patient was present for the entirety of group session. Patient participated in opening and closing remarks. However, patient did not contribute at all to the topic of discussion despite encouraged participation.   Therapeutic Modalities Cognitive Behavioral Therapy Motivational Interviewing  Almedia Balls 09/23/2022  2:24 PM

## 2022-09-23 NOTE — Group Note (Signed)
Date:  09/23/2022 Time:  10:09 AM  Group Topic/Focus:  Goals Group:   The focus of this group is to help patients establish daily goals to achieve during treatment and discuss how the patient can incorporate goal setting into their daily lives to aide in recovery.  Community Meeting   Participation Level:  Active  Participation Quality:  Appropriate  Affect:  Appropriate  Cognitive:  Appropriate  Insight: Appropriate  Engagement in Group:  Engaged  Modes of Intervention:  Discussion, Education, and Support  Additional Comments:    Wilford Corner 09/23/2022, 10:09 AM

## 2022-09-23 NOTE — Group Note (Signed)
Date:  09/23/2022 Time:  4:53 PM  Group Topic/Focus:  Activity Group    Participation Level:  Active  Participation Quality:  Appropriate  Affect:  Appropriate  Cognitive:  Appropriate  Insight: Appropriate  Engagement in Group:  Engaged  Modes of Intervention:  Activity  Additional Comments:    Lidiya Reise Travis Darienne Belleau 09/23/2022, 4:53 PM  

## 2022-09-23 NOTE — Progress Notes (Signed)
Patient calm and pleasant during assessment denying SI/HI/AVH. Pt observed interacting appropriately with staff and peers on the unit. Pt didn't have any medications scheduled tonight and hasn't requested anything PRN. Pt given education, support, and encouragement to be active in his treatment plan. Pt being monitored Q 15 minutes for safety per unit protocol, remains safe on the unit

## 2022-09-23 NOTE — Group Note (Signed)
Recreation Therapy Group Note   Group Topic:Healthy Support Systems  Group Date: 09/23/2022 Start Time: 1000 End Time: 1110 Facilitators: Rosina Lowenstein, LRT, CTRS Location:  Craft Room  Group Description: Straw Bridge. Individually, patients were given 10 plastic drinking straws and an equal length of masking tape. Using the materials provided, patients were instructed to build a free-standing bridge-like structure to suspend an everyday item (ex: puzzle box) off the floor or table surface. All materials were required to be used in Secondary school teacher. LRT facilitated post-activity discussion reviewing how we, humans, are like the structure we built; when things get too heavy in our life and we do not have adequate supports/coping skills, then we will fall just like the straw-built structure will. LRT focused on how having a "base" or structure on the bottom was necessary for the object to stand, meaning we must be secure and stable first before building on ourselves or others. Patients were encouraged to name 2 healthy supports in their life and reflect on how the skills used in this activity can be generalized to daily life post discharge.  Goal Area(s) Addressed:  Patient will identify two healthy support systems in their life. Patient will work on Product manager. Patient will verbalize the importance of having a strong and steady "base".  Patient will follow multi-step directions. Patients will engage in creativity and use all provided materials.  Affect/Mood: Appropriate   Participation Level: Active and Engaged   Participation Quality: Independent   Behavior: Appropriate, Cooperative, and Eager   Speech/Thought Process: Coherent   Insight: Good   Judgement: Moderate   Modes of Intervention: Activity   Patient Response to Interventions:  Attentive, Engaged, Interested , and Receptive   Education Outcome:  Acknowledges education   Clinical  Observations/Individualized Feedback: Raymond [name] was active in their participation of session activities and group discussion. Pt identified "my mom and dad" as his healthy supports in his life. Pt used all materials to successfully build a structure during the activity. Pt interacted well with LRT and peers while spontaneously contributing to group discussion.    Plan: Continue to engage patient in RT group sessions 2-3x/week.   Rosina Lowenstein, LRT, CTRS 09/23/2022 12:02 PM

## 2022-09-23 NOTE — Progress Notes (Signed)
Patient alert / oriented x 4.  He denied SI, HI, hallucinations, and delusions.  He is able to answer all questions appropriately.  He denied pain & discomfort.  He socialized in the Dayroom with other patients.  No s/sx. Of acute distress.  No adverse reactions to medications.  Patient heard by this writer making an involuntary sound.  He reported that the Dr. Is aware.    Patient attended Group & participated.  Plan of Care remains in place.  Q28m checks completed.  Rest in bed with closed eyes.  Respirations even & unlabored.

## 2022-09-24 DIAGNOSIS — S069XAA Unspecified intracranial injury with loss of consciousness status unknown, initial encounter: Secondary | ICD-10-CM | POA: Diagnosis not present

## 2022-09-24 MED ORDER — RISPERIDONE 1 MG PO TABS
0.5000 mg | ORAL_TABLET | Freq: Two times a day (BID) | ORAL | Status: DC
  Administered 2022-09-24 – 2022-09-26 (×5): 0.5 mg via ORAL
  Filled 2022-09-24 (×5): qty 1

## 2022-09-24 MED ORDER — FLUOXETINE HCL 10 MG PO CAPS
10.0000 mg | ORAL_CAPSULE | Freq: Every day | ORAL | Status: DC
  Administered 2022-09-24 – 2022-09-26 (×3): 10 mg via ORAL
  Filled 2022-09-24 (×3): qty 1

## 2022-09-24 NOTE — Group Note (Signed)
[facility] LCSW Group Therapy Note   Group Date: 09/24/2022 Start Time: 1315 End Time: 1410   Type of Therapy/Topic:  Group Therapy:  Emotion Regulation  Participation Level:  Active    Description of Group:    The purpose of this group is to assist patients in learning to regulate negative emotions and experience positive emotions. Patients will be guided to discuss ways in which they have been vulnerable to their negative emotions. These vulnerabilities will be juxtaposed with experiences of positive emotions or situations, and patients challenged to use positive emotions to combat negative ones. Special emphasis will be placed on coping with negative emotions in conflict situations, and patients will process healthy conflict resolution skills.  Therapeutic Goals: Patient will identify two positive emotions or experiences to reflect on in order to balance out negative emotions:  Patient will label two or more emotions that they find the most difficult to experience:  Patient will be able to demonstrate positive conflict resolution skills through discussion or role plays:   Summary of Patient Progress: Patient was present for the entirety of the group process. He was appropriate during the session. Pt shared an example of a time that he got upset because of a situation and wanted to kill himself. However, pt stated that he took some time and prayed about it, which made him feel better and he was able to continue moving forward. Pt also acknowledged the death of his mother as something that continues to bother him. Prayer was identified as the way he copes with negative emotions. Insight into the topic is questionable. Pt appeared to be open and receptive to feedback/comments from both peers and facilitator.    Therapeutic Modalities:   Cognitive Behavioral Therapy Feelings Identification Dialectical Behavioral Therapy   Glenis Smoker, LCSW

## 2022-09-24 NOTE — Plan of Care (Signed)
  Problem: Education: Goal: Knowledge of General Education information will improve Description: Including pain rating scale, medication(s)/side effects and non-pharmacologic comfort measures Outcome: Progressing   Problem: Health Behavior/Discharge Planning: Goal: Ability to manage health-related needs will improve Outcome: Progressing   Problem: Clinical Measurements: Goal: Ability to maintain clinical measurements within normal limits will improve Outcome: Progressing Goal: Will remain free from infection Outcome: Progressing Goal: Diagnostic test results will improve Outcome: Progressing Goal: Respiratory complications will improve Outcome: Progressing Goal: Cardiovascular complication will be avoided Outcome: Progressing   Problem: Activity: Goal: Risk for activity intolerance will decrease Outcome: Progressing   Problem: Nutrition: Goal: Adequate nutrition will be maintained Outcome: Progressing   Problem: Coping: Goal: Level of anxiety will decrease Outcome: Progressing   Problem: Elimination: Goal: Will not experience complications related to bowel motility Outcome: Progressing Goal: Will not experience complications related to urinary retention Outcome: Progressing   Problem: Pain Managment: Goal: General experience of comfort will improve Outcome: Progressing   Problem: Safety: Goal: Ability to remain free from injury will improve Outcome: Progressing   Problem: Skin Integrity: Goal: Risk for impaired skin integrity will decrease Outcome: Progressing   Problem: Education: Goal: Knowledge of [place] [facility] Education information/materials will improve Outcome: Progressing Goal: Emotional status will improve Outcome: Progressing Goal: Mental status will improve Outcome: Progressing Goal: Verbalization of understanding the information provided will improve Outcome: Progressing   Problem: Safety: Goal: Periods of time without injury will  increase Outcome: Progressing   Problem: Medication: Goal: Compliance with prescribed medication regimen will improve Outcome: Progressing   Problem: Self-Concept: Goal: Ability to disclose and discuss suicidal ideas will improve Outcome: Progressing Goal: Will verbalize positive feelings about self Outcome: Progressing   Problem: Coping: Goal: Coping ability will improve Outcome: Progressing Goal: Will verbalize feelings Outcome: Progressing   Problem: Self-Concept: Goal: Will verbalize positive feelings about self Outcome: Progressing Goal: Level of anxiety will decrease Outcome: Progressing   Problem: Self-Concept: Goal: Level of anxiety will decrease Outcome: Progressing   

## 2022-09-24 NOTE — BH IP Treatment Plan (Signed)
Interdisciplinary Treatment and Diagnostic Plan Update  09/24/2022 Time of Session: 0830 Raymond [name] MRN: [id number]  Principal Diagnosis: Traumatic brain injury  Secondary Diagnoses: Principal Problem:   Traumatic brain injury Active Problems:   Seizure disorder   Cocaine abuse   Amphetamine abuse   Current Medications:  Current Facility-Administered Medications  Medication Dose Route Frequency Provider Last Rate Last Admin   acetaminophen (TYLENOL) tablet 650 mg  650 mg Oral Q6H PRN Oneta Rack, NP       albuterol (VENTOLIN HFA) 108 (90 Base) MCG/ACT inhaler 2 puff  2 puff Inhalation Q4H PRN Clapacs, Jackquline Denmark, MD       alum & mag hydroxide-simeth (MAALOX/MYLANTA) 200-200-20 MG/5ML suspension 30 mL  30 mL Oral Q4H PRN Oneta Rack, NP       diphenhydrAMINE (BENADRYL) capsule 50 mg  50 mg Oral TID PRN Oneta Rack, NP       Or   diphenhydrAMINE (BENADRYL) injection 50 mg  50 mg Intramuscular TID PRN Oneta Rack, NP       FLUoxetine (PROZAC) capsule 10 mg  10 mg Oral Daily Clapacs, Jackquline Denmark, MD   10 mg at 09/24/22 1114   levETIRAcetam (KEPPRA) tablet 500 mg  500 mg Oral BID Clapacs, Jackquline Denmark, MD   500 mg at 09/24/22 4098   magnesium hydroxide (MILK OF MAGNESIA) suspension 30 mL  30 mL Oral Daily PRN Oneta Rack, NP       risperiDONE (RISPERDAL) tablet 0.5 mg  0.5 mg Oral BID Clapacs, [name], MD   0.5 mg at 09/24/22 1114   traZODone (DESYREL) tablet 50 mg  50 mg Oral QHS PRN Oneta Rack, NP       PTA Medications: Medications Prior to Admission  Medication Sig Dispense Refill Last Dose   albuterol (VENTOLIN HFA) 108 (90 Base) MCG/ACT inhaler SMARTSIG:2 Puff(s) Via Inhaler 4 Times Daily PRN (Patient not taking: Reported on 11/22/2021)      amoxicillin-clavulanate (AUGMENTIN) 875-125 MG tablet Take 1 tablet by mouth every 12 (twelve) hours. (Patient not taking: Reported on 09/22/2022) 14 tablet 0 Completed Course   ARIPiprazole (ABILIFY) 2 MG tablet Take 2 mg by mouth  daily. (Patient not taking: Reported on 09/22/2022)   Not Taking   atomoxetine (STRATTERA) 40 MG capsule Take 40 mg by mouth daily. (Patient not taking: Reported on 09/22/2022)   Not Taking   azithromycin (ZITHROMAX) 250 MG tablet Take 1 tablet (250 mg total) by mouth daily. Take first 2 tablets together, then 1 every day until finished. (Patient not taking: Reported on 09/22/2022) 6 tablet 0 Completed Course   hydrOXYzine (ATARAX) 25 MG tablet Take 25 mg by mouth 3 (three) times daily as needed for anxiety. (Patient not taking: Reported on 09/22/2022)   Not Taking   prazosin (MINIPRESS) 1 MG capsule Take 1 mg by mouth at bedtime. (Patient not taking: Reported on 09/22/2022)   Not Taking   predniSONE (DELTASONE) 20 MG tablet Take 40 mg by mouth daily. 5 day course. (Patient not taking: Reported on 11/22/2021)   Not Taking   sertraline (ZOLOFT) 25 MG tablet Take 25 mg by mouth daily. (Patient not taking: Reported on 09/22/2022)   Not Taking   traZODone (DESYREL) 50 MG tablet Take 100 mg by mouth at bedtime. (Patient not taking: Reported on 09/22/2022)   Not Taking    Patient Stressors: Financial difficulties   Health problems   Medication change or noncompliance    Patient Strengths: Communication  skills  General fund of knowledge  Motivation for treatment/growth   Treatment Modalities: Medication Management, Group therapy, Case management,  1 to 1 session with clinician, Psychoeducation, Recreational therapy.   Physician Treatment Plan for Primary Diagnosis: Traumatic brain injury Long Term Goal(s): Improvement in symptoms so as ready for discharge   Short Term Goals: Compliance with prescribed medications will improve Ability to verbalize feelings will improve Ability to demonstrate self-control will improve  Medication Management: Evaluate patient's response, side effects, and tolerance of medication regimen.  Therapeutic Interventions: 1 to 1 sessions, Unit Group sessions and Medication  administration.  Evaluation of Outcomes: Progressing  Physician Treatment Plan for Secondary Diagnosis: Principal Problem:   Traumatic brain injury Active Problems:   Seizure disorder   Cocaine abuse   Amphetamine abuse  Long Term Goal(s): Improvement in symptoms so as ready for discharge   Short Term Goals: Compliance with prescribed medications will improve Ability to verbalize feelings will improve Ability to demonstrate self-control will improve     Medication Management: Evaluate patient's response, side effects, and tolerance of medication regimen.  Therapeutic Interventions: 1 to 1 sessions, Unit Group sessions and Medication administration.  Evaluation of Outcomes: Progressing   RN Treatment Plan for Primary Diagnosis: Traumatic brain injury Long Term Goal(s): Knowledge of disease and therapeutic regimen to maintain health will improve  Short Term Goals: Ability to remain free from injury will improve, Ability to verbalize frustration and anger appropriately will improve, Ability to demonstrate self-control, Ability to participate in decision making will improve, Ability to verbalize feelings will improve, Ability to disclose and discuss suicidal ideas, Ability to identify and develop effective coping behaviors will improve, and Compliance with prescribed medications will improve  Medication Management: RN will administer medications as ordered by provider, will assess and evaluate patient's response and provide education to patient for prescribed medication. RN will report any adverse and/or side effects to prescribing provider.  Therapeutic Interventions: 1 on 1 counseling sessions, Psychoeducation, Medication administration, Evaluate responses to treatment, Monitor vital signs and CBGs as ordered, Perform/monitor CIWA, COWS, AIMS and Fall Risk screenings as ordered, Perform wound care treatments as ordered.  Evaluation of Outcomes: Progressing   LCSW Treatment Plan for  Primary Diagnosis: Traumatic brain injury Long Term Goal(s): Safe transition to appropriate next level of care at discharge, Engage patient in therapeutic group addressing interpersonal concerns.  Short Term Goals: Engage patient in aftercare planning with referrals and resources, Increase social support, Increase ability to appropriately verbalize feelings, Increase emotional regulation, Facilitate acceptance of mental health diagnosis and concerns, Facilitate patient progression through stages of change regarding substance use diagnoses and concerns, Identify triggers associated with mental health/substance abuse issues, and Increase skills for wellness and recovery  Therapeutic Interventions: Assess for all discharge needs, 1 to 1 time with Social worker, Explore available resources and support systems, Assess for adequacy in community support network, Educate family and significant other(s) on suicide prevention, Complete Psychosocial Assessment, Interpersonal group therapy.  Evaluation of Outcomes: Progressing   Progress in Treatment: Attending groups: Yes. Participating in groups: No. Taking medication as prescribed: Yes. Toleration medication: Yes. Family/Significant other contact made: No, will contact:  patient declined consent  Patient understands diagnosis: Yes. Discussing patient identified problems/goals with staff: Yes. Medical problems stabilized or resolved: Yes. Denies suicidal/homicidal ideation: No. Issues/concerns per patient self-inventory: Yes. Other: none  New problem(s) identified: No, Describe:  none  New Short Term/Long Term Goal(s): Patient to work towards detox, medication management for mood stabilization; elimination of SI thoughts;  development of comprehensive mental wellness/sobriety plan.  Patient Goals:  Patient states their goal for treatment is to "my health, seizures, depression, and anxiety."  Discharge Plan or Barriers: No psychosocial barriers  identified at this time, patient to return to place of residence when appropriate for discharge.   Reason for Continuation of Hospitalization: Depression Medication stabilization  Estimated Length of Stay: 1-7 days   Scribe for Treatment Team: Almedia Balls 09/24/2022 2:07 PM

## 2022-09-24 NOTE — Progress Notes (Signed)
This writer assumed care of patient at 1300. Patient is found in the dayroom, with staff and other members on the unit, with no complaints to voice to this Clinical research associate. Patient remains safe on the unit at this time.

## 2022-09-24 NOTE — Progress Notes (Signed)
Patient calm and pleasant during assessment denying SI/HI/AVH. Pt observed interacting appropriately with staff and peers on the unit. Pt didn't have any medications scheduled tonight and hasn't requested anything PRN. Pt given education, support, and encouragement to be active in his treatment plan. Pt being monitored Q 15 minutes for safety per unit protocol, remains safe on the unit 

## 2022-09-24 NOTE — Progress Notes (Signed)
[facility] MD Progress Note  09/24/2022 3:12 PM Raymond [name]  MRN:  [id number] Subjective: Patient seen and chart reviewed.  Patient reports that he is feeling better.  He has not had any seizures.  Still feeling anxious however.  Tolerating medicine.  Requests that we plan on discharge for Friday rather than tomorrow. Principal Problem: Traumatic brain injury Diagnosis: Principal Problem:   Traumatic brain injury Active Problems:   Seizure disorder   Cocaine abuse   Amphetamine abuse  Total Time spent with patient: 30 minutes  Past Psychiatric History: Seems like we have little data but certainly seems to have a history of traumatic brain injury probably substance abuse possibly other mental health injuries  Past Medical History:  Past Medical History:  Diagnosis Date   Anxiety    Hepatitis C    PTSD (post-traumatic stress disorder)     Past Surgical History:  Procedure Laterality Date   FACIAL FRACTURE SURGERY     metal plate under right eye   Family History: History reviewed. No pertinent family history. Family Psychiatric  History: See previous Social History:  Social History   Substance and Sexual Activity  Alcohol Use Not Currently     Social History   Substance and Sexual Activity  Drug Use Not Currently    Social History   Socioeconomic History   Marital status: Single    Spouse name: Not on file   Number of children: Not on file   Years of education: Not on file   Highest education level: Not on file  Occupational History   Not on file  Tobacco Use   Smoking status: Some Days    Types: Cigarettes   Smokeless tobacco: Never  Vaping Use   Vaping Use: Never used  Substance and Sexual Activity   Alcohol use: Not Currently   Drug use: Not Currently   Sexual activity: Not on file  Other Topics Concern   Not on file  Social History Narrative   Not on file   Social Determinants of Health   Financial Resource Strain: Not on file  Food Insecurity: No Food  Insecurity (09/22/2022)   Hunger Vital Sign    Worried About Running Out of Food in the Last Year: Never true    Ran Out of Food in the Last Year: Never true  Recent Concern: Food Insecurity - Food Insecurity Present (08/13/2022)   Hunger Vital Sign    Worried About Running Out of Food in the Last Year: Sometimes true    Ran Out of Food in the Last Year: Sometimes true  Transportation Needs: No Transportation Needs (09/22/2022)   PRAPARE - Administrator, Civil Service (Medical): No    Lack of Transportation (Non-Medical): No  Physical Activity: Not on file  Stress: Not on file  Social Connections: Not on file   Additional Social History:                         Sleep: Fair  Appetite:  Fair  Current Medications: Current Facility-Administered Medications  Medication Dose Route Frequency Provider Last Rate Last Admin   acetaminophen (TYLENOL) tablet 650 mg  650 mg Oral Q6H PRN Oneta Rack, NP       albuterol (VENTOLIN HFA) 108 (90 Base) MCG/ACT inhaler 2 puff  2 puff Inhalation Q4H PRN Kedra Mcglade T, MD       alum & mag hydroxide-simeth (MAALOX/MYLANTA) 200-200-20 MG/5ML suspension 30 mL  30 mL Oral  Q4H PRN Oneta Rack, NP       diphenhydrAMINE (BENADRYL) capsule 50 mg  50 mg Oral TID PRN Oneta Rack, NP       Or   diphenhydrAMINE (BENADRYL) injection 50 mg  50 mg Intramuscular TID PRN Oneta Rack, NP       FLUoxetine (PROZAC) capsule 10 mg  10 mg Oral Daily Danel Studzinski T, MD   10 mg at 09/24/22 1114   levETIRAcetam (KEPPRA) tablet 500 mg  500 mg Oral BID Filemon Breton T, MD   500 mg at 09/24/22 2956   magnesium hydroxide (MILK OF MAGNESIA) suspension 30 mL  30 mL Oral Daily PRN Oneta Rack, NP       risperiDONE (RISPERDAL) tablet 0.5 mg  0.5 mg Oral BID Dontrelle Mazon T, MD   0.5 mg at 09/24/22 1114   traZODone (DESYREL) tablet 50 mg  50 mg Oral QHS PRN Oneta Rack, NP        Lab Results: No results found for this or any previous  visit (from the past 48 hour(s)).  Blood Alcohol level:  Lab Results  Component Value Date   ETH <10 09/20/2022    Metabolic Disorder Labs: No results found for: "HGBA1C", "MPG" No results found for: "PROLACTIN" No results found for: "CHOL", "TRIG", "HDL", "CHOLHDL", "VLDL", "LDLCALC"  Physical Findings: AIMS:  , ,  ,  ,    CIWA:    COWS:     Musculoskeletal: Strength & Muscle Tone: within normal limits Gait & Station: normal Patient leans: N/A  Psychiatric Specialty Exam:  Presentation  General Appearance: No data recorded Eye Contact:No data recorded Speech:No data recorded Speech Volume:No data recorded Handedness:No data recorded  Mood and Affect  Mood:No data recorded Affect:No data recorded  Thought Process  Thought Processes:No data recorded Descriptions of Associations:No data recorded Orientation:No data recorded Thought Content:No data recorded History of Schizophrenia/Schizoaffective disorder:No  Duration of Psychotic Symptoms:No data recorded Hallucinations:No data recorded Ideas of Reference:No data recorded Suicidal Thoughts:No data recorded Homicidal Thoughts:No data recorded  Sensorium  Memory:No data recorded Judgment:No data recorded Insight:No data recorded  Executive Functions  Concentration:No data recorded Attention Span:No data recorded Recall:No data recorded Fund of Knowledge:No data recorded Language:No data recorded  Psychomotor Activity  Psychomotor Activity:No data recorded  Assets  Assets:No data recorded  Sleep  Sleep:No data recorded   Physical Exam: Physical Exam Vitals and nursing note reviewed.  Constitutional:      Appearance: Normal appearance.  HENT:     Head: Normocephalic and atraumatic.     Mouth/Throat:     Pharynx: Oropharynx is clear.  Eyes:     Pupils: Pupils are equal, round, and reactive to light.  Cardiovascular:     Rate and Rhythm: Normal rate and regular rhythm.  Pulmonary:      Effort: Pulmonary effort is normal.     Breath sounds: Normal breath sounds.  Abdominal:     General: Abdomen is flat.     Palpations: Abdomen is soft.  Musculoskeletal:        General: Normal range of motion.  Skin:    General: Skin is warm and dry.  Neurological:     General: No focal deficit present.     Mental Status: He is alert. Mental status is at baseline.  Psychiatric:        Attention and Perception: Attention normal.        Mood and Affect: Mood is anxious.  Speech: Speech is tangential.        Behavior: Behavior is cooperative.        Thought Content: Thought content normal.        Cognition and Memory: Cognition is impaired. Memory is impaired.    Review of Systems  Constitutional: Negative.   HENT: Negative.    Eyes: Negative.   Respiratory: Negative.    Cardiovascular: Negative.   Gastrointestinal: Negative.   Musculoskeletal: Negative.   Skin: Negative.   Neurological: Negative.   Psychiatric/Behavioral:  The patient is nervous/anxious.    Blood pressure 126/82, pulse 69, temperature 98 F (36.7 C), temperature source Oral, resp. rate 20, height  (1.854 m), weight 76.2 kg, SpO2 98 %. Body mass index is 22.16 kg/m.   Treatment Plan Summary: Reviewed medication.  He had been taking medicine for depression and anxiety and psychosis.  Vague about whether was helpful.  I suggest that we start 10 mg of fluoxetine and Risperdal 1/2 mg twice a day.  This is in addition to the Keppra.  Encourage group attendance which she is doing very well at. Mordecai Rasmussen, MD 09/24/2022, 3:12 PM

## 2022-09-25 ENCOUNTER — Other Ambulatory Visit: Payer: Self-pay

## 2022-09-25 ENCOUNTER — Other Ambulatory Visit (HOSPITAL_COMMUNITY): Payer: Self-pay

## 2022-09-25 DIAGNOSIS — S069XAA Unspecified intracranial injury with loss of consciousness status unknown, initial encounter: Secondary | ICD-10-CM | POA: Diagnosis not present

## 2022-09-25 MED ORDER — TRAZODONE HCL 50 MG PO TABS: 50.0000 mg | ORAL_TABLET | Freq: Every evening | ORAL | 1 refills | Status: DC | PRN

## 2022-09-25 MED ORDER — LEVETIRACETAM 500 MG PO TABS
500.0000 mg | ORAL_TABLET | Freq: Two times a day (BID) | ORAL | 0 refills | Status: DC
  Filled 2022-09-25: qty 60, 30d supply, fill #0

## 2022-09-25 MED ORDER — ALBUTEROL SULFATE HFA 108 (90 BASE) MCG/ACT IN AERS: 2.0000 | INHALATION_SPRAY | RESPIRATORY_TRACT | 1 refills | Status: DC | PRN

## 2022-09-25 MED ORDER — RISPERIDONE 0.5 MG PO TABS: 0.5000 mg | ORAL_TABLET | Freq: Two times a day (BID) | ORAL | 1 refills | Status: DC

## 2022-09-25 MED ORDER — TRAZODONE HCL 50 MG PO TABS
50.0000 mg | ORAL_TABLET | Freq: Every evening | ORAL | 0 refills | Status: DC | PRN
  Filled 2022-09-25: qty 30, 30d supply, fill #0

## 2022-09-25 MED ORDER — FLUOXETINE HCL 10 MG PO CAPS
10.0000 mg | ORAL_CAPSULE | Freq: Every day | ORAL | 0 refills | Status: DC
  Filled 2022-09-25: qty 30, 30d supply, fill #0

## 2022-09-25 MED ORDER — LEVETIRACETAM 500 MG PO TABS: 500.0000 mg | ORAL_TABLET | Freq: Two times a day (BID) | ORAL | 1 refills | Status: DC

## 2022-09-25 MED ORDER — ALBUTEROL SULFATE HFA 108 (90 BASE) MCG/ACT IN AERS
2.0000 | INHALATION_SPRAY | RESPIRATORY_TRACT | 0 refills | Status: DC | PRN
  Filled 2022-09-25: qty 6.7, 25d supply, fill #0

## 2022-09-25 MED ORDER — RISPERIDONE 0.5 MG PO TABS
0.5000 mg | ORAL_TABLET | Freq: Two times a day (BID) | ORAL | 0 refills | Status: DC
  Filled 2022-09-25: qty 60, 30d supply, fill #0

## 2022-09-25 MED ORDER — FLUOXETINE HCL 10 MG PO CAPS: 10.0000 mg | ORAL_CAPSULE | Freq: Every day | ORAL | 1 refills | Status: DC

## 2022-09-25 NOTE — Plan of Care (Signed)
  Problem: Education: Goal: Knowledge of General Education information will improve Description: Including pain rating scale, medication(s)/side effects and non-pharmacologic comfort measures Outcome: Progressing   Problem: Health Behavior/Discharge Planning: Goal: Ability to manage health-related needs will improve Outcome: Progressing   Problem: Clinical Measurements: Goal: Ability to maintain clinical measurements within normal limits will improve Outcome: Progressing Goal: Will remain free from infection Outcome: Progressing Goal: Diagnostic test results will improve Outcome: Progressing Goal: Respiratory complications will improve Outcome: Progressing Goal: Cardiovascular complication will be avoided Outcome: Progressing   Problem: Activity: Goal: Risk for activity intolerance will decrease Outcome: Progressing   Problem: Nutrition: Goal: Adequate nutrition will be maintained Outcome: Progressing   Problem: Coping: Goal: Level of anxiety will decrease Outcome: Progressing   Problem: Elimination: Goal: Will not experience complications related to bowel motility Outcome: Progressing Goal: Will not experience complications related to urinary retention Outcome: Progressing   Problem: Pain Managment: Goal: General experience of comfort will improve Outcome: Progressing   Problem: Safety: Goal: Ability to remain free from injury will improve Outcome: Progressing   Problem: Skin Integrity: Goal: Risk for impaired skin integrity will decrease Outcome: Progressing   Problem: Education: Goal: Knowledge of [place] [facility] Education information/materials will improve Outcome: Progressing Goal: Emotional status will improve Outcome: Progressing Goal: Mental status will improve Outcome: Progressing Goal: Verbalization of understanding the information provided will improve Outcome: Progressing   Problem: Safety: Goal: Periods of time without injury will  increase Outcome: Progressing   Problem: Medication: Goal: Compliance with prescribed medication regimen will improve Outcome: Progressing   Problem: Self-Concept: Goal: Ability to disclose and discuss suicidal ideas will improve Outcome: Progressing Goal: Will verbalize positive feelings about self Outcome: Progressing   Problem: Coping: Goal: Coping ability will improve Outcome: Progressing Goal: Will verbalize feelings Outcome: Progressing   Problem: Self-Concept: Goal: Will verbalize positive feelings about self Outcome: Progressing Goal: Level of anxiety will decrease Outcome: Progressing   Problem: Self-Concept: Goal: Level of anxiety will decrease Outcome: Progressing

## 2022-09-25 NOTE — Progress Notes (Signed)
D- Patient alert and oriented x 4. Affect anxious/mood preoccupied. Denies SI/ HI/ AVH. Patient denies pain. Patient endorses depression and anxiety. His goal of the day is his "health", and he will "pray" to help meet his goal. A- Scheduled medications administered to patient, per MD orders. Support and encouragement provided.  Routine safety checks conducted every 15 minutes without incident.  Patient informed to notify staff with problems or concerns and verbalizes understanding. R- No adverse drug reactions noted.  Patient compliant with medications and treatment plan. Patient receptive, calm cooperative and interacts well with others on the unit.  Patient contracts for safety and  remains safe on the unit at this time.

## 2022-09-25 NOTE — Progress Notes (Signed)
[facility] MD Progress Note  09/25/2022 12:33 PM Raymond [name]  MRN:  [id number] Subjective: Patient has been stable.  No behavior problems.  Interacts well with others on the unit.  No report of any psychosis or suicidal ideation Principal Problem: Traumatic brain injury Diagnosis: Principal Problem:   Traumatic brain injury Active Problems:   Seizure disorder   Cocaine abuse   Amphetamine abuse  Total Time spent with patient: 30 minutes  Past Psychiatric History: Past history of traumatic brain injury chronic anxiety  Past Medical History:  Past Medical History:  Diagnosis Date   Anxiety    Hepatitis C    PTSD (post-traumatic stress disorder)     Past Surgical History:  Procedure Laterality Date   FACIAL FRACTURE SURGERY     metal plate under right eye   Family History: History reviewed. No pertinent family history. Family Psychiatric  History: See previous Social History:  Social History   Substance and Sexual Activity  Alcohol Use Not Currently     Social History   Substance and Sexual Activity  Drug Use Not Currently    Social History   Socioeconomic History   Marital status: Single    Spouse name: Not on file   Number of children: Not on file   Years of education: Not on file   Highest education level: Not on file  Occupational History   Not on file  Tobacco Use   Smoking status: Some Days    Types: Cigarettes   Smokeless tobacco: Never  Vaping Use   Vaping Use: Never used  Substance and Sexual Activity   Alcohol use: Not Currently   Drug use: Not Currently   Sexual activity: Not on file  Other Topics Concern   Not on file  Social History Narrative   Not on file   Social Determinants of Health   Financial Resource Strain: Not on file  Food Insecurity: No Food Insecurity (09/22/2022)   Hunger Vital Sign    Worried About Running Out of Food in the Last Year: Never true    Ran Out of Food in the Last Year: Never true  Recent Concern: Food Insecurity -  Food Insecurity Present (08/13/2022)   Hunger Vital Sign    Worried About Running Out of Food in the Last Year: Sometimes true    Ran Out of Food in the Last Year: Sometimes true  Transportation Needs: No Transportation Needs (09/22/2022)   PRAPARE - Administrator, Civil Service (Medical): No    Lack of Transportation (Non-Medical): No  Physical Activity: Not on file  Stress: Not on file  Social Connections: Not on file   Additional Social History:                         Sleep: Fair  Appetite:  Fair  Current Medications: Current Facility-Administered Medications  Medication Dose Route Frequency Provider Last Rate Last Admin   acetaminophen (TYLENOL) tablet 650 mg  650 mg Oral Q6H PRN Oneta Rack, NP       albuterol (VENTOLIN HFA) 108 (90 Base) MCG/ACT inhaler 2 puff  2 puff Inhalation Q4H PRN Morganna Styles T, MD       alum & mag hydroxide-simeth (MAALOX/MYLANTA) 200-200-20 MG/5ML suspension 30 mL  30 mL Oral Q4H PRN Oneta Rack, NP       diphenhydrAMINE (BENADRYL) capsule 50 mg  50 mg Oral TID PRN Oneta Rack, NP  Or   diphenhydrAMINE (BENADRYL) injection 50 mg  50 mg Intramuscular TID PRN Oneta Rack, NP       FLUoxetine (PROZAC) capsule 10 mg  10 mg Oral Daily Kaegan Stigler T, MD   10 mg at 09/25/22 0914   levETIRAcetam (KEPPRA) tablet 500 mg  500 mg Oral BID Trimaine Maser T, MD   500 mg at 09/25/22 4098   magnesium hydroxide (MILK OF MAGNESIA) suspension 30 mL  30 mL Oral Daily PRN Oneta Rack, NP       risperiDONE (RISPERDAL) tablet 0.5 mg  0.5 mg Oral BID Nekeya Briski T, MD   0.5 mg at 09/25/22 0914   traZODone (DESYREL) tablet 50 mg  50 mg Oral QHS PRN Oneta Rack, NP        Lab Results: No results found for this or any previous visit (from the past 48 hour(s)).  Blood Alcohol level:  Lab Results  Component Value Date   ETH <10 09/20/2022    Metabolic Disorder Labs: No results found for: "HGBA1C", "MPG" No results  found for: "PROLACTIN" No results found for: "CHOL", "TRIG", "HDL", "CHOLHDL", "VLDL", "LDLCALC"  Physical Findings: AIMS:  , ,  ,  ,    CIWA:    COWS:     Musculoskeletal: Strength & Muscle Tone: within normal limits Gait & Station: normal Patient leans: N/A  Psychiatric Specialty Exam:  Presentation  General Appearance: No data recorded Eye Contact:No data recorded Speech:No data recorded Speech Volume:No data recorded Handedness:No data recorded  Mood and Affect  Mood:No data recorded Affect:No data recorded  Thought Process  Thought Processes:No data recorded Descriptions of Associations:No data recorded Orientation:No data recorded Thought Content:No data recorded History of Schizophrenia/Schizoaffective disorder:No  Duration of Psychotic Symptoms:No data recorded Hallucinations:No data recorded Ideas of Reference:No data recorded Suicidal Thoughts:No data recorded Homicidal Thoughts:No data recorded  Sensorium  Memory:No data recorded Judgment:No data recorded Insight:No data recorded  Executive Functions  Concentration:No data recorded Attention Span:No data recorded Recall:No data recorded Fund of Knowledge:No data recorded Language:No data recorded  Psychomotor Activity  Psychomotor Activity:No data recorded  Assets  Assets:No data recorded  Sleep  Sleep:No data recorded   Physical Exam: Physical Exam Vitals and nursing note reviewed.  Constitutional:      Appearance: Normal appearance.  HENT:     Head: Normocephalic and atraumatic.     Mouth/Throat:     Pharynx: Oropharynx is clear.  Eyes:     Pupils: Pupils are equal, round, and reactive to light.  Cardiovascular:     Rate and Rhythm: Normal rate and regular rhythm.  Pulmonary:     Effort: Pulmonary effort is normal.     Breath sounds: Normal breath sounds.  Abdominal:     General: Abdomen is flat.     Palpations: Abdomen is soft.  Musculoskeletal:        General: Normal  range of motion.  Skin:    General: Skin is warm and dry.  Neurological:     General: No focal deficit present.     Mental Status: He is alert. Mental status is at baseline.  Psychiatric:        Attention and Perception: Attention normal.        Mood and Affect: Mood normal.        Speech: Speech normal.        Behavior: Behavior is cooperative.        Thought Content: Thought content normal.  Cognition and Memory: Cognition normal.    Review of Systems  Constitutional: Negative.   HENT: Negative.    Eyes: Negative.   Respiratory: Negative.    Cardiovascular: Negative.   Gastrointestinal: Negative.   Musculoskeletal: Negative.   Skin: Negative.   Neurological: Negative.   Psychiatric/Behavioral: Negative.     Blood pressure 126/82, pulse 69, temperature 98 F (36.7 C), temperature source Oral, resp. rate 20, height  (1.854 m), weight 76.2 kg, SpO2 98 %. Body mass index is 22.16 kg/m.   Treatment Plan Summary: Medication management and Plan no change to medicine.  Medicines were adjusted slightly yesterday and he is tolerating them well.  We will begin the process of arranging for discharge tomorrow.  Mordecai Rasmussen, MD 09/25/2022, 12:33 PM

## 2022-09-25 NOTE — Group Note (Signed)
Recreation Therapy Group Note   Group Topic:Self-Esteem  Group Date: 09/25/2022 Start Time: 1000 End Time: 1100 Facilitators: Rosina Lowenstein, LRT, CTRS Location:  Craft Room  Group Description: Patients and LRT discussed the importance of self-love and self-esteem. Pt completed a worksheet that helps them identify 24 different strengths and qualities about themselves. Pt encouraged to read aloud at least 3 off their sheet to the group. LRT and pts discussed how this can be applied to daily life post-discharge.  Pt's then played "Positive Affirmation Bingo" afterwards, with journals, activity books or stress balls as bingo prizes.  Goal Area(s) Addressed: Patient will identify positive qualities about themselves. Patient will learn new positive affirmations.  Patient will recite positive qualities and affirmations aloud to the group.   Affect/Mood: Full range   Participation Level: Active and Engaged   Participation Quality: Minimal Cues   Behavior: Alert and Eager   Speech/Thought Process: Coherent   Insight: Good   Judgement: Fair    Modes of Intervention: Activity and Worksheet   Patient Response to Interventions:  Attentive, Interested , and Receptive   Education Outcome:  Acknowledges education   Clinical Observations/Individualized Feedback: Raymond [name] was active in their participation of session activities and group discussion. Pt identified "I am nice, kind, and sweet" as compliments that he has received. Pt helped a peer with translating during group. Pt interacted well with LRT and peers duration of session.    Plan: Continue to engage patient in RT group sessions 2-3x/week.   Rosina Lowenstein, LRT, CTRS 09/25/2022 11:39 AM

## 2022-09-25 NOTE — Group Note (Signed)
[facility] LCSW Group Therapy Note   Group Date: 09/25/2022 Start Time: 1314 End Time: 1405   Type of Therapy/Topic:  Group Therapy:  Balance in Life  Participation Level:  Did Not Attend   Description of Group:    This group will address the concept of balance and how it feels and looks when one is unbalanced. Patients will be encouraged to process areas in their lives that are out of balance, and identify reasons for remaining unbalanced. Facilitators will guide patients utilizing problem- solving interventions to address and correct the stressor making their life unbalanced. Understanding and applying boundaries will be explored and addressed for obtaining  and maintaining a balanced life. Patients will be encouraged to explore ways to assertively make their unbalanced needs known to significant others in their lives, using other group members and facilitator for support and feedback.  Therapeutic Goals: Patient will identify two or more emotions or situations they have that consume much of in their lives. Patient will identify signs/triggers that life has become out of balance:  Patient will identify two ways to set boundaries in order to achieve balance in their lives:  Patient will demonstrate ability to communicate their needs through discussion and/or role plays  Summary of Patient Progress: X   Therapeutic Modalities:   Cognitive Behavioral Therapy Solution-Focused Therapy Assertiveness Training   Jhamari Markowicz R Lark Runk, LCSW 

## 2022-09-25 NOTE — Progress Notes (Signed)
Patient calm and pleasant during assessment denying SI/HI/AVH. Pt observed interacting appropriately with staff and peers on the unit. Pt compliant with medication administration per MD orders. Pt given education, support, and encouragement to be active in his treatment plan. Pt being monitored Q 15 minutes for safety per unit protocol, remains safe on the unit  

## 2022-09-26 DIAGNOSIS — S069XAA Unspecified intracranial injury with loss of consciousness status unknown, initial encounter: Secondary | ICD-10-CM | POA: Diagnosis not present

## 2022-09-26 NOTE — [facility] Suicide Risk Assessment (Signed)
[facility] Discharge Suicide Risk Assessment   Principal Problem: Traumatic brain injury [facility]) Discharge Diagnoses: Principal Problem:   Traumatic brain injury (HCC) Active Problems:   Seizure disorder (HCC)   Cocaine abuse (HCC)   Amphetamine abuse (HCC)   Total Time spent with patient: 30 minutes  Musculoskeletal: Strength & Muscle Tone: within normal limits Gait & Station: normal Patient leans: N/A  Psychiatric Specialty Exam  Presentation  General Appearance: No data recorded Eye Contact:No data recorded Speech:No data recorded Speech Volume:No data recorded Handedness:No data recorded  Mood and Affect  Mood:No data recorded Duration of Depression Symptoms: Greater than two weeks  Affect:No data recorded  Thought Process  Thought Processes:No data recorded Descriptions of Associations:No data recorded Orientation:No data recorded Thought Content:No data recorded History of Schizophrenia/Schizoaffective disorder:No  Duration of Psychotic Symptoms:No data recorded Hallucinations:No data recorded Ideas of Reference:No data recorded Suicidal Thoughts:No data recorded Homicidal Thoughts:No data recorded  Sensorium  Memory:No data recorded Judgment:No data recorded Insight:No data recorded  Executive Functions  Concentration:No data recorded Attention Span:No data recorded Recall:No data recorded Fund of Knowledge:No data recorded Language:No data recorded  Psychomotor Activity  Psychomotor Activity:No data recorded  Assets  Assets:No data recorded  Sleep  Sleep:No data recorded  Physical Exam: Physical Exam Vitals and nursing note reviewed.  Constitutional:      Appearance: Normal appearance.  HENT:     Head: Normocephalic and atraumatic.     Mouth/Throat:     Pharynx: Oropharynx is clear.  Eyes:     Pupils: Pupils are equal, round, and reactive to light.  Cardiovascular:     Rate and Rhythm: Normal rate and regular rhythm.  Pulmonary:      Effort: Pulmonary effort is normal.     Breath sounds: Normal breath sounds.  Abdominal:     General: Abdomen is flat.     Palpations: Abdomen is soft.  Musculoskeletal:        General: Normal range of motion.  Skin:    General: Skin is warm and dry.  Neurological:     General: No focal deficit present.     Mental Status: He is alert. Mental status is at baseline.  Psychiatric:        Attention and Perception: Attention normal.        Mood and Affect: Mood normal.        Speech: Speech normal.        Behavior: Behavior is cooperative.        Thought Content: Thought content normal.        Cognition and Memory: Cognition normal.    Review of Systems  Constitutional: Negative.   HENT: Negative.    Eyes: Negative.   Respiratory: Negative.    Cardiovascular: Negative.   Gastrointestinal: Negative.   Musculoskeletal: Negative.   Skin: Negative.   Neurological: Negative.   Psychiatric/Behavioral: Negative.     Blood pressure 118/85, pulse 66, temperature 97.6 F (36.4 C), temperature source Oral, resp. rate 18, height 6\' 1"  (1.854 m), weight 76.2 kg, SpO2 98 %. Body mass index is 22.16 kg/m.  Mental Status Per Nursing Assessment::   On Admission:  NA  Demographic Factors:  Male and Living alone  Loss Factors: Financial problems/change in socioeconomic status  Historical Factors: NA  Risk Reduction Factors:   Positive therapeutic relationship  Continued Clinical Symptoms:  Severe Anxiety and/or Agitation Medical Diagnoses and Treatments/Surgeries  Cognitive Features That Contribute To Risk:  None    Suicide Risk:  Minimal: No identifiable  suicidal ideation.  Patients presenting with no risk factors but with morbid ruminations; may be classified as minimal risk based on the severity of the depressive symptoms   Follow-up Information     [facility]. Go to.   Specialty: Behavioral Health Why: Pleaes present for new patient walk in  clinic to begin services Mon - Friday at 0730AM. Clinic operates on first com first serve basis and begins seeing patients at 0800AM. Contact information: [address] [place] [address] [phone]        [facility]. Schedule an appointment as soon as possible for a visit.   Why: Please call to make an appointment with your existing counselor. Contact information: [address] [place], [place] [address] [phone]                Plan Of Care/Follow-up recommendations:  Other:  Patient is agreeable to follow-up with local mental health agencies and will be given prescriptions for medicine.  He denies suicidal ideation and is not displaying any dangerous behavior.  Mordecai Rasmussen, MD 09/26/2022, 10:16 AM

## 2022-09-26 NOTE — Progress Notes (Addendum)
  [facility] Adult Case Management Discharge Plan :  Will you be returning to the same living situation after discharge:  Yes,  Patient to return to place of residence, patient lives alone.  At discharge, do you have transportation home?: Yes,  Patient provided with transportation voucher to residence. Transportation waiver has been signed and sent to medical records   Do you have the ability to pay for your medications: Yes,  [employer].   Release of information consent forms completed and in the chart;  Patient's signature needed at discharge.  Patient to Follow up at:  Follow-up Information     [facility] [place]. Go to.   Specialty: Behavioral Health Why: Pleaes present for new patient walk in clinic to begin services Mon - Friday at 0730AM. Clinic operates on first com first serve basis and begins seeing patients at 0800AM. Contact information: [address] [place] [address] [phone]        [facility]. Schedule an appointment as soon as possible for a visit.   Why: Please call to make an appointment with your existing counselor. Contact information: [address] [place], [place] [address] [phone]                Next level of care provider has access to [facility] Link:no  Safety Planning and Suicide Prevention discussed: Yes,  SPE completed with patient by nursing staff, patient declined consent for CSW to reach family/friend.    Has patient been referred to the Quitline?: Patient refused referral Tobacco Use: High Risk (09/22/2022)   Patient History    Smoking Tobacco Use: Some Days    Smokeless Tobacco Use: Never    Passive Exposure: Not on file   Patient has been referred for addiction treatment: Pt. refused referral Social History   Substance and Sexual Activity  Drug Use Not Currently   Social History   Substance and Sexual Activity  Alcohol Use Not Currently  Drugs of Abuse     Component Value Date/Time   LABOPIA  NONE DETECTED 09/20/2022 1457   COCAINSCRNUR POSITIVE (A) 09/20/2022 1457   LABBENZ NONE DETECTED 09/20/2022 1457   AMPHETMU POSITIVE (A) 09/20/2022 1457   THCU POSITIVE (A) 09/20/2022 1457   LABBARB NONE DETECTED 09/20/2022 1457     Corky Crafts, LCSWA 09/26/2022, 9:31 AM

## 2022-09-26 NOTE — Progress Notes (Signed)
Patient ID: Raymond [name], male   DOB: 02-02-1981, 42 y.o.   MRN: [id number]  Discharge Note:  Patient denies SI/HI/AVH at this time. Discharge instructions, AVS, prescriptions, medication supply, and transition record gone over with patient. Patient given a copy of his Suicide Safety Plan. Patient agrees to comply with medication management, follow-up visit, and outpatient therapy. Patient belongings returned to patient. Patient questions and concerns addressed and answered. Patient ambulatory off unit. Patient discharged to home via [employer] services.

## 2022-09-26 NOTE — Plan of Care (Signed)
  Problem: Education: Goal: Knowledge of General Education information will improve Description: Including pain rating scale, medication(s)/side effects and non-pharmacologic comfort measures Outcome: Adequate for Discharge   Problem: Health Behavior/Discharge Planning: Goal: Ability to manage health-related needs will improve Outcome: Adequate for Discharge   Problem: Clinical Measurements: Goal: Ability to maintain clinical measurements within normal limits will improve Outcome: Adequate for Discharge Goal: Will remain free from infection Outcome: Adequate for Discharge Goal: Diagnostic test results will improve Outcome: Adequate for Discharge Goal: Respiratory complications will improve Outcome: Adequate for Discharge Goal: Cardiovascular complication will be avoided Outcome: Adequate for Discharge   Problem: Activity: Goal: Risk for activity intolerance will decrease Outcome: Adequate for Discharge   Problem: Nutrition: Goal: Adequate nutrition will be maintained Outcome: Adequate for Discharge   Problem: Coping: Goal: Level of anxiety will decrease Outcome: Adequate for Discharge   Problem: Elimination: Goal: Will not experience complications related to bowel motility Outcome: Adequate for Discharge Goal: Will not experience complications related to urinary retention Outcome: Adequate for Discharge   Problem: Pain Managment: Goal: General experience of comfort will improve Outcome: Adequate for Discharge   Problem: Safety: Goal: Ability to remain free from injury will improve Outcome: Adequate for Discharge   Problem: Skin Integrity: Goal: Risk for impaired skin integrity will decrease Outcome: Adequate for Discharge   Problem: Education: Goal: Knowledge of [facility] Education information/materials will improve Outcome: Adequate for Discharge Goal: Emotional status will improve Outcome: Adequate for Discharge Goal: Mental status will  improve Outcome: Adequate for Discharge Goal: Verbalization of understanding the information provided will improve Outcome: Adequate for Discharge   Problem: Safety: Goal: Periods of time without injury will increase Outcome: Adequate for Discharge   Problem: Medication: Goal: Compliance with prescribed medication regimen will improve Outcome: Adequate for Discharge   Problem: Self-Concept: Goal: Ability to disclose and discuss suicidal ideas will improve Outcome: Adequate for Discharge Goal: Will verbalize positive feelings about self Outcome: Adequate for Discharge   Problem: Coping: Goal: Coping ability will improve Outcome: Adequate for Discharge Goal: Will verbalize feelings Outcome: Adequate for Discharge   Problem: Self-Concept: Goal: Will verbalize positive feelings about self Outcome: Adequate for Discharge Goal: Level of anxiety will decrease Outcome: Adequate for Discharge   Problem: Self-Concept: Goal: Level of anxiety will decrease Outcome: Adequate for Discharge

## 2022-09-26 NOTE — Group Note (Signed)
Recreation Therapy Group Note   Group Topic:Leisure Education  Group Date: 09/26/2022 Start Time: 1000 End Time: 1100 Facilitators: Rosina Lowenstein, LRT, CTRS Location:  Craft Room  Group Description: Leisure. Patients were given the option to choose from singing karaoke, make origami, playing with a deck of cards, or coloring mandalas. LRT and pts discussed the importance of participating in leisure during their free time and when they're outside of the hospital. Pt identified two leisure interests and shared with the group.   Goal Area(s) Addressed:  Patient will identify a current leisure interest.  Patient will practice making a positive decision. Patient will have the opportunity to try a new leisure activity.  Affect/Mood: Appropriate and Full range   Participation Level: Active and Engaged   Participation Quality: Independent   Behavior: Appropriate, Cooperative, and Eager   Speech/Thought Process: Coherent   Insight: Good   Judgement: Good   Modes of Intervention: Activity   Patient Response to Interventions:  Attentive, Engaged, Interested , and Receptive   Education Outcome:  Acknowledges education   Clinical Observations/Individualized Feedback: Raymond [name] was active in their participation of session activities and group discussion. Pt identified "shop and hangout with friends" as things he enjoys doing in his free time. Pt chose to sing karaoke both individually and with a peer as a duet while in group. Pt interacted well with peers and LRT duration of session.   Plan: Continue to engage patient in RT group sessions 2-3x/week.   Rosina Lowenstein, LRT, CTRS 09/26/2022 11:41 AM

## 2022-09-26 NOTE — Progress Notes (Signed)
D- Patient alert and oriented. Patient presents in a pleasant mood on assessment reporting that he slept "good" last night and had no complaints to voice to this Clinical research associate. Patient endorsed depression, hopelessness, and anxiety on his self-inventory, stating "everything", is why he's feeling this way. Patient denies SI, HI, AVH, and pain at this time. Patient's goal for today is "moving", in which "patience" will help him achieve his goal.  A- Scheduled medications administered to patient, per MD orders. Support and encouragement provided.  Routine safety checks conducted every 15 minutes.  Patient informed to notify staff with problems or concerns.  R- No adverse drug reactions noted. Patient contracts for safety at this time. Patient compliant with medications and treatment plan. Patient receptive, calm, and cooperative. Patient interacts well with others on the unit.  Patient remains safe at this time.

## 2022-09-26 NOTE — Discharge Summary (Addendum)
Physician Discharge Summary Note  Patient:  Raymond [name] is an 42 y.o., male MRN:  [id number] DOB:  Jan 15, 1981 Patient phone:  [phone] (home)  Patient address:   [address] [place] [address],  Total Time spent with patient: 30 minutes  Date of Admission:  09/22/2022 Date of Discharge: 09/26/2022  Reason for Admission: Patient was admitted because of concern about some disorganized thinking and behavior and some reports of having made a suicidal statement  Principal Problem: Traumatic brain injury [facility]) Discharge Diagnoses: Principal Problem:   Traumatic brain injury [facility]) Active Problems:   Seizure disorder (HCC)   Cocaine abuse (HCC)   Amphetamine abuse (HCC)   Past Psychiatric History: Past history of traumatic brain injury seizures substance abuse and anxiety symptoms  Past Medical History:  Past Medical History:  Diagnosis Date   Anxiety    Hepatitis C    PTSD (post-traumatic stress disorder)     Past Surgical History:  Procedure Laterality Date   FACIAL FRACTURE SURGERY     metal plate under right eye   Family History: History reviewed. No pertinent family history. Family Psychiatric  History: None reported Social History:  Social History   Substance and Sexual Activity  Alcohol Use Not Currently     Social History   Substance and Sexual Activity  Drug Use Not Currently    Social History   Socioeconomic History   Marital status: Single    Spouse name: Not on file   Number of children: Not on file   Years of education: Not on file   Highest education level: Not on file  Occupational History   Not on file  Tobacco Use   Smoking status: Some Days    Types: Cigarettes   Smokeless tobacco: Never  Vaping Use   Vaping Use: Never used  Substance and Sexual Activity   Alcohol use: Not Currently   Drug use: Not Currently   Sexual activity: Not on file  Other Topics Concern   Not on file  Social History Narrative   Not on  file   Social Determinants of Health   Financial Resource Strain: Not on file  Food Insecurity: No Food Insecurity (09/22/2022)   Hunger Vital Sign    Worried About Running Out of Food in the Last Year: Never true    Ran Out of Food in the Last Year: Never true  Recent Concern: Food Insecurity - Food Insecurity Present (08/13/2022)   Hunger Vital Sign    Worried About Running Out of Food in the Last Year: Sometimes true    Ran Out of Food in the Last Year: Sometimes true  Transportation Needs: No Transportation Needs (09/22/2022)   PRAPARE - Administrator, Civil Service (Medical): No    Lack of Transportation (Non-Medical): No  Physical Activity: Not on file  Stress: Not on file  Social Connections: Not on file    Hospital Course: Patient was admitted to the psychiatric hospital and continued on 15-minute checks.  He did not display any dangerous behavior in the hospital.  He was cooperative with medication.  No seizures were witnessed.  Medication was prescribed for seizures and for mood and was tolerated well.  Patient is being referred back to outpatient treatment in his home community and given a supply of medicines and prescriptions at discharge  Physical Findings: AIMS:  , ,  ,  ,    CIWA:    COWS:     Musculoskeletal: Strength &  Muscle Tone: within normal limits Gait & Station: normal Patient leans: N/A   Psychiatric Specialty Exam:  Presentation  General Appearance: No data recorded Eye Contact:No data recorded Speech:No data recorded Speech Volume:No data recorded Handedness:No data recorded  Mood and Affect  Mood:No data recorded Affect:No data recorded  Thought Process  Thought Processes:No data recorded Descriptions of Associations:No data recorded Orientation:No data recorded Thought Content:No data recorded History of Schizophrenia/Schizoaffective disorder:No  Duration of Psychotic Symptoms:No data recorded Hallucinations:No data  recorded Ideas of Reference:No data recorded Suicidal Thoughts:No data recorded Homicidal Thoughts:No data recorded  Sensorium  Memory:No data recorded Judgment:No data recorded Insight:No data recorded  Executive Functions  Concentration:No data recorded Attention Span:No data recorded Recall:No data recorded Fund of Knowledge:No data recorded Language:No data recorded  Psychomotor Activity  Psychomotor Activity:No data recorded  Assets  Assets:No data recorded  Sleep  Sleep:No data recorded   Physical Exam: Physical Exam Constitutional:      Appearance: Normal appearance.  HENT:     Head: Normocephalic and atraumatic.     Mouth/Throat:     Pharynx: Oropharynx is clear.  Eyes:     Pupils: Pupils are equal, round, and reactive to light.  Cardiovascular:     Rate and Rhythm: Normal rate and regular rhythm.  Pulmonary:     Effort: Pulmonary effort is normal.     Breath sounds: Normal breath sounds.  Abdominal:     General: Abdomen is flat.     Palpations: Abdomen is soft.  Musculoskeletal:        General: Normal range of motion.  Skin:    General: Skin is warm and dry.  Neurological:     General: No focal deficit present.     Mental Status: He is alert. Mental status is at baseline.  Psychiatric:        Mood and Affect: Mood normal.        Thought Content: Thought content normal.    Review of Systems  Constitutional: Negative.   HENT: Negative.    Eyes: Negative.   Respiratory: Negative.    Cardiovascular: Negative.   Gastrointestinal: Negative.   Musculoskeletal: Negative.   Skin: Negative.   Neurological: Negative.   Psychiatric/Behavioral: Negative.     Blood pressure 118/85, pulse 66, temperature 97.6 F (36.4 C), temperature source Oral, resp. rate 18, height 6\' 1"  (1.854 m), weight 76.2 kg, SpO2 98 %. Body mass index is 22.16 kg/m.   Social History   Tobacco Use  Smoking Status Some Days   Types: Cigarettes  Smokeless Tobacco Never    Tobacco Cessation:  A prescription for an FDA-approved tobacco cessation medication was offered at discharge and the patient refused   Blood Alcohol level:  Lab Results  Component Value Date   ETH <10 09/20/2022    Metabolic Disorder Labs:  No results found for: "HGBA1C", "MPG" No results found for: "PROLACTIN" No results found for: "CHOL", "TRIG", "HDL", "CHOLHDL", "VLDL", "LDLCALC"  See Psychiatric Specialty Exam and Suicide Risk Assessment completed by Attending Physician prior to discharge.  Discharge destination:  Home  Is patient on multiple antipsychotic therapies at discharge:  No   Has Patient had three or more failed trials of antipsychotic monotherapy by history:  No  Recommended Plan for Multiple Antipsychotic Therapies: NA  Discharge Instructions     Diet - low sodium heart healthy   Complete by: As directed    Increase activity slowly   Complete by: As directed       Allergies  as of 09/26/2022       Reactions   Other    Peanuts        Medication List     STOP taking these medications    amoxicillin-clavulanate 875-125 MG tablet Commonly known as: AUGMENTIN   ARIPiprazole 2 MG tablet Commonly known as: ABILIFY   atomoxetine 40 MG capsule Commonly known as: STRATTERA   azithromycin 250 MG tablet Commonly known as: ZITHROMAX   hydrOXYzine 25 MG tablet Commonly known as: ATARAX   prazosin 1 MG capsule Commonly known as: MINIPRESS   predniSONE 20 MG tablet Commonly known as: DELTASONE   sertraline 25 MG tablet Commonly known as: ZOLOFT       TAKE these medications      Indication  albuterol 108 (90 Base) MCG/ACT inhaler Commonly known as: VENTOLIN HFA Inhale 2 puffs into the lungs every 4 (four) hours as needed for wheezing or shortness of breath. What changed: See the new instructions.  Indication: Chronic Obstructive Lung Disease   FLUoxetine 10 MG capsule Commonly known as: PROZAC Take 1 capsule (10 mg total) by mouth  daily.  Indication: Depression   levETIRAcetam 500 MG tablet Commonly known as: KEPPRA Take 1 tablet (500 mg total) by mouth 2 (two) times daily.  Indication: Seizure   risperiDONE 0.5 MG tablet Commonly known as: RISPERDAL Take 1 tablet (0.5 mg total) by mouth 2 (two) times daily.  Indication: MIXED BIPOLAR AFFECTIVE DISORDER, Tourette's   traZODone 50 MG tablet Commonly known as: DESYREL Take 1 tablet (50 mg total) by mouth at bedtime as needed for sleep. What changed:  how much to take when to take this reasons to take this  Indication: Trouble Sleeping        Follow-up Information     [facility]. Go to.   Specialty: Behavioral Health Why: Pleaes present for new patient walk in clinic to begin services Mon - Friday at 0730AM. Clinic operates on first com first serve basis and begins seeing patients at 0800AM. Contact information: [address] [place] [address] [phone]        [facility]. Schedule an appointment as soon as possible for a visit.   Why: Please call to make an appointment with your existing counselor. Contact information: [address] [place], [place] [address] [phone]                Follow-up recommendations:  Other:  Follow-up with local agencies in [place].  Also [facility]  Continue current medicine.  Avoid all drug use.  Comments: See above  Signed: Mordecai Rasmussen, MD 09/26/2022, 10:18 AM

## 2022-09-27 ENCOUNTER — Emergency Department (HOSPITAL_COMMUNITY)
Admission: EM | Admit: 2022-09-27 | Discharge: 2022-09-28 | Disposition: A | Discharge status: Home / Self Care | Payer: BLUE CROSS/BLUE SHIELD | Attending: Emergency Medicine | Admitting: Emergency Medicine

## 2022-09-27 ENCOUNTER — Emergency Department (HOSPITAL_COMMUNITY): Payer: BLUE CROSS/BLUE SHIELD

## 2022-09-27 ENCOUNTER — Other Ambulatory Visit: Payer: Self-pay

## 2022-09-27 DIAGNOSIS — R45851 Suicidal ideations: Secondary | ICD-10-CM | POA: Diagnosis not present

## 2022-09-27 DIAGNOSIS — U071 COVID-19: Secondary | ICD-10-CM | POA: Diagnosis not present

## 2022-09-27 DIAGNOSIS — G40909 Epilepsy, unspecified, not intractable, without status epilepticus: Secondary | ICD-10-CM | POA: Insufficient documentation

## 2022-09-27 DIAGNOSIS — F332 Major depressive disorder, recurrent severe without psychotic features: Secondary | ICD-10-CM | POA: Diagnosis not present

## 2022-09-27 DIAGNOSIS — Y902 Blood alcohol level of 40-59 mg/100 ml: Secondary | ICD-10-CM | POA: Diagnosis not present

## 2022-09-27 DIAGNOSIS — R0602 Shortness of breath: Secondary | ICD-10-CM | POA: Diagnosis present

## 2022-09-27 DIAGNOSIS — S069XAA Unspecified intracranial injury with loss of consciousness status unknown, initial encounter: Secondary | ICD-10-CM | POA: Diagnosis present

## 2022-09-27 DIAGNOSIS — F1994 Other psychoactive substance use, unspecified with psychoactive substance-induced mood disorder: Secondary | ICD-10-CM | POA: Diagnosis not present

## 2022-09-27 DIAGNOSIS — F1914 Other psychoactive substance abuse with psychoactive substance-induced mood disorder: Secondary | ICD-10-CM | POA: Diagnosis not present

## 2022-09-27 LAB — CBC WITH DIFFERENTIAL/PLATELET
Abs Immature Granulocytes: 0.04 10*3/uL (ref 0.00–0.07)
Basophils Absolute: 0 10*3/uL (ref 0.0–0.1)
Basophils Relative: 0 %
Eosinophils Absolute: 0.1 10*3/uL (ref 0.0–0.5)
Eosinophils Relative: 1 %
HCT: 44.8 % (ref 39.0–52.0)
Hemoglobin: 14.9 g/dL (ref 13.0–17.0)
Immature Granulocytes: 0 %
Lymphocytes Relative: 20 %
Lymphs Abs: 1.9 10*3/uL (ref 0.7–4.0)
MCH: 30.8 pg (ref 26.0–34.0)
MCHC: 33.3 g/dL (ref 30.0–36.0)
MCV: 92.8 fL (ref 80.0–100.0)
Monocytes Absolute: 0.7 10*3/uL (ref 0.1–1.0)
Monocytes Relative: 7 %
Neutro Abs: 6.6 10*3/uL (ref 1.7–7.7)
Neutrophils Relative %: 72 %
Platelets: 298 10*3/uL (ref 150–400)
RBC: 4.83 MIL/uL (ref 4.22–5.81)
RDW: 13.8 % (ref 11.5–15.5)
WBC: 9.2 10*3/uL (ref 4.0–10.5)
nRBC: 0 % (ref 0.0–0.2)

## 2022-09-27 LAB — RAPID URINE DRUG SCREEN, HOSP PERFORMED
Amphetamines: NOT DETECTED
Barbiturates: NOT DETECTED
Benzodiazepines: NOT DETECTED
Cocaine: NOT DETECTED
Opiates: NOT DETECTED
Tetrahydrocannabinol: POSITIVE — AB

## 2022-09-27 LAB — COMPREHENSIVE METABOLIC PANEL
ALT: 32 U/L (ref 0–44)
AST: 23 U/L (ref 15–41)
Albumin: 3.8 g/dL (ref 3.5–5.0)
Alkaline Phosphatase: 96 U/L (ref 38–126)
Anion gap: 13 (ref 5–15)
BUN: 10 mg/dL (ref 6–20)
CO2: 18 mmol/L — ABNORMAL LOW (ref 22–32)
Calcium: 8.7 mg/dL — ABNORMAL LOW (ref 8.9–10.3)
Chloride: 105 mmol/L (ref 98–111)
Creatinine, Ser: 0.78 mg/dL (ref 0.61–1.24)
GFR, Estimated: >60 mL/min (ref >60)
Glucose, Bld: 106 mg/dL — ABNORMAL HIGH (ref 70–99)
Potassium: 3.4 mmol/L — ABNORMAL LOW (ref 3.5–5.1)
Sodium: 136 mmol/L (ref 135–145)
Total Bilirubin: 0.3 mg/dL (ref 0.3–1.2)
Total Protein: 6.9 g/dL (ref 6.5–8.1)

## 2022-09-27 LAB — SARS CORONAVIRUS 2 BY RT PCR: SARS Coronavirus 2 by RT PCR: POSITIVE — AB

## 2022-09-27 LAB — TROPONIN I (HIGH SENSITIVITY): Troponin I (High Sensitivity): 3 ng/L (ref <18)

## 2022-09-27 LAB — ETHANOL: Alcohol, Ethyl (B): 47 mg/dL — ABNORMAL HIGH (ref <10)

## 2022-09-27 LAB — SALICYLATE LEVEL: Salicylate Lvl: <7 mg/dL — ABNORMAL LOW (ref 7.0–30.0)

## 2022-09-27 LAB — ACETAMINOPHEN LEVEL: Acetaminophen (Tylenol), Serum: <10 ug/mL — ABNORMAL LOW (ref 10–30)

## 2022-09-27 NOTE — ED Provider Notes (Signed)
[place] EMERGENCY DEPARTMENT AT [facility] Provider Note   CSN: [id number] Arrival date & time: 09/27/22  1936    History {Add pertinent medical, surgical, social history, OB history to HPI:1} Chief Complaint  Patient presents with   Suicidal    Raymond [name] is a 42 y.o. male history of TBI, seizure disorder, polysubstance use here for evaluation of suicide attempt.  Patient brought in from [facility] due to SI, attempt and panic attack.  Patient states he requested EMS bring him to [facility] as he was here few days ago however they brought him here.  States his psychiatric meds have not been working.  States he took "20 pills" this morning and attempt to end his life.  He cannot tell me what he took.  States he has some shortness of breath.  EMS placed patient on 2 L via nasal cannula for comfort however he was never hypoxic, tachycardic or tachypneic.  He denies headache, nausea, vomiting, back pain, abdominal pain.  Denies HI, AVH.  Per nursing patient states he took a handful of pills yesterday after dc from Psych, did not take anything today. He could not tell her what meds he took.  HPI     Home Medications Prior to Admission medications   Medication Sig Start Date End Date Taking? Authorizing Provider  albuterol (VENTOLIN HFA) 108 (90 Base) MCG/ACT inhaler Inhale 2 puffs into the lungs every 4 (four) hours as needed for wheezing or shortness of breath. 09/25/22   Clapacs, Jackquline Denmark, MD  FLUoxetine (PROZAC) 10 MG capsule Take 1 capsule (10 mg total) by mouth daily. 09/26/22   Clapacs, Jackquline Denmark, MD  levETIRAcetam (KEPPRA) 500 MG tablet Take 1 tablet (500 mg total) by mouth 2 (two) times daily. 09/25/22   Clapacs, Jackquline Denmark, MD  risperiDONE (RISPERDAL) 0.5 MG tablet Take 1 tablet (0.5 mg total) by mouth 2 (two) times daily. 09/25/22   Clapacs, Jackquline Denmark, MD  traZODone (DESYREL) 50 MG tablet Take 1 tablet (50 mg total) by mouth at bedtime as needed for sleep. 09/25/22   Clapacs, Jackquline Denmark, MD       Allergies    Other    Review of Systems   Review of Systems  Constitutional: Negative.   HENT: Negative.    Respiratory:  Positive for shortness of breath.   Cardiovascular: Negative.   Gastrointestinal: Negative.   Genitourinary: Negative.   Musculoskeletal: Negative.   Neurological: Negative.   Psychiatric/Behavioral:  Positive for self-injury, sleep disturbance and suicidal ideas. The patient is nervous/anxious.   All other systems reviewed and are negative.   Physical Exam Updated Vital Signs BP 127/84 (BP Location: Right Arm)   Pulse 88   Temp 98.2 F (36.8 C) (Oral)   Resp (!) 22   Ht 6\' 1"  (1.854 m)   Wt 76.2 kg   SpO2 99%   BMI 22.16 kg/m  Physical Exam Vitals and nursing note reviewed.  Constitutional:      General: He is not in acute distress.    Appearance: He is well-developed. He is not ill-appearing, toxic-appearing or diaphoretic.  HENT:     Head: Normocephalic and atraumatic.     Nose: Nose normal.     Mouth/Throat:     Mouth: Mucous membranes are moist.  Eyes:     Pupils: Pupils are equal, round, and reactive to light.  Cardiovascular:     Rate and Rhythm: Normal rate and regular rhythm.     Pulses: Normal pulses.  Heart sounds: Normal heart sounds.  Pulmonary:     Effort: Pulmonary effort is normal. No respiratory distress.     Breath sounds: Normal breath sounds.  Abdominal:     General: Bowel sounds are normal. There is no distension.     Palpations: Abdomen is soft.  Musculoskeletal:        General: No swelling or tenderness. Normal range of motion.     Cervical back: Normal range of motion and neck supple.     Right lower leg: No edema.     Left lower leg: No edema.  Skin:    General: Skin is warm and dry.     Capillary Refill: Capillary refill takes less than 2 seconds.  Neurological:     General: No focal deficit present.     Mental Status: He is alert.     Cranial Nerves: Cranial nerves 2-12 are intact.     Sensory:  Sensation is intact.     Motor: Motor function is intact.  Psychiatric:        Attention and Perception: He is inattentive. He does not perceive auditory or visual hallucinations.        Mood and Affect: Mood is anxious.        Speech: Speech is rapid and pressured and tangential.        Behavior: Behavior is hyperactive.        Thought Content: Thought content includes suicidal ideation. Thought content does not include homicidal ideation. Thought content includes suicidal plan. Thought content does not include homicidal plan.        Cognition and Memory: Memory is impaired.     ED Results / Procedures / Treatments   Labs (all labs ordered are listed, but only abnormal results are displayed) Labs Reviewed  SARS CORONAVIRUS 2 BY RT PCR - Abnormal; Notable for the following components:      Result Value   SARS Coronavirus 2 by RT PCR POSITIVE (*)    All other components within normal limits  COMPREHENSIVE METABOLIC PANEL - Abnormal; Notable for the following components:   Potassium 3.4 (*)    CO2 18 (*)    Glucose, Bld 106 (*)    Calcium 8.7 (*)    All other components within normal limits  ETHANOL - Abnormal; Notable for the following components:   Alcohol, Ethyl (B) 47 (*)    All other components within normal limits  RAPID URINE DRUG SCREEN, HOSP PERFORMED - Abnormal; Notable for the following components:   Tetrahydrocannabinol POSITIVE (*)    All other components within normal limits  SALICYLATE LEVEL - Abnormal; Notable for the following components:   Salicylate Lvl <7.0 (*)    All other components within normal limits  ACETAMINOPHEN LEVEL - Abnormal; Notable for the following components:   Acetaminophen (Tylenol), Serum <10 (*)    All other components within normal limits  CBC WITH DIFFERENTIAL/PLATELET  LEVETIRACETAM LEVEL  ACETAMINOPHEN LEVEL  TROPONIN I (HIGH SENSITIVITY)    EKG None  Radiology DG Chest 2 View  Result Date: 09/27/2022 CLINICAL DATA:  SOB  EXAM: CHEST - 2 VIEW COMPARISON:  None Available. FINDINGS: The heart size and mediastinal contours are within normal limits. Both lungs are clear. The visualized skeletal structures are unremarkable. IMPRESSION: No active cardiopulmonary disease. Electronically Signed   By: Darliss Cheney M.D.   On: 09/27/2022 20:22    Procedures Procedures  {Document cardiac monitor, telemetry assessment procedure when appropriate:1}  Medications Ordered in ED Medications -  No data to display  ED Course/ Medical Decision Making/ A&P Clinical Course as of 09/27/22 2312  Sat Sep 27, 2022  2159 I was consulted to IVC the patient. [CC]    Clinical Course User Index [CC] Glyn Ade, MD    42 year old history of TBI, polysubstance use for evaluation uses ideation.  Patient states he had a "panic attack.".  Was short of breath at that time.  No chest pain.  No current shortness of breath.  No cough. Increased stressors at home.  Not been compliant with his behavioral health medications since discharge which was yesterday from [place].  Appears otherwise well, clear lung sounds bilaterally.  No hypoxia.  Does admit to ingesting 20 pills of unknown substance earlier today intent in life.  Labs and imaging personally viewed and interpreted:  COVID positive CBC without leukocytosis Ethanol 47 Metabolic panel potassium 3.4 Troponin 3 UDS positive for THC  Discussed results with patient.  He is agitated and states he does not have COVID.  He states "in that case I want to go home."  Discussed with patient given that he told me and multiple of the staff that he ingested 20 pills of an unknown substance and intent to end his life that he he sustained talk with psychiatry.  He is very upset about this.  Patient placed under IVC.   If second acetaminophen within normal limits patient be medically cleared for psychiatry evaluation  {   Click here for ABCD2, HEART and other calculatorsREFRESH Note before  signing :1}                          Medical Decision Making Amount and/or Complexity of Data Reviewed Independent Historian: EMS External Data Reviewed: labs, radiology, ECG and notes. Labs: ordered. Decision-making details documented in ED Course. Radiology: ordered and independent interpretation performed. Decision-making details documented in ED Course. ECG/medicine tests: ordered and independent interpretation performed. Decision-making details documented in ED Course.  Risk OTC drugs. Prescription drug management. Parenteral controlled substances. Decision regarding hospitalization. Diagnosis or treatment significantly limited by social determinants of health.    {Document critical care time when appropriate:1} {Document review of labs and clinical decision tools ie heart score, Chads2Vasc2 etc:1}  {Document your independent review of radiology images, and any outside records:1} {Document your discussion with family members, caretakers, and with consultants:1} {Document social determinants of health affecting pt's care:1} {Document your decision making why or why not admission, treatments were needed:1} Final Clinical Impression(s) / ED Diagnoses Final diagnoses:  Suicidal ideation  COVID    Rx / DC Orders ED Discharge Orders     None

## 2022-09-27 NOTE — ED Notes (Signed)
Patient transported to radiology

## 2022-09-27 NOTE — ED Notes (Signed)
Report for ivc given to kanetria to complete process for gpd to sign and she is aware that copies are to be made 

## 2022-09-27 NOTE — ED Notes (Signed)
IVC paperwork completed in blue zone clipboard . IVC' D 09/27/22 Exp. 10/04/22.

## 2022-09-27 NOTE — ED Notes (Signed)
Patient is not ivc for now  VOLUNTARY CONSENT FORM ATTACHED TO THE CLIPBOARD IN BLUE ZONE

## 2022-09-27 NOTE — ED Notes (Signed)
Pt states he feels better and wants to go home. RN explained that he cannot be discharged until he is medically and psychiatrically cleared. Pt states that he understands but then immediately repeats that he feels better and wants to go home. PA aware.

## 2022-09-27 NOTE — ED Notes (Signed)
Pt states he feels suicidal after losing his mom and 2 children within the past two years. This has made him very depressed and anxious. He also has PTSD and a TBI from [employer], and he is prone to anxiety attacks. Pt states he has had multiple panic attacks today and wanted to try again to kill himself but has not attempted to do so today. He states he was previously treated for similar concerns at [facility] and wants to be transferred for the remainder of his care. Provider is aware.

## 2022-09-27 NOTE — ED Notes (Signed)
Pt belongings placed in locker #6 in purple zone °

## 2022-09-27 NOTE — ED Triage Notes (Signed)
Pt via [facility] from [facility] c/o panic attack and SI. Pt is stressed because he wants to go to [facility]) but could not be transported there by EMS at the time. His current providers are at that facility. Pt says his meds have not been working so he is no longer compliant with [facility] meds. He recently lost his mother and has not been coping well. Hx lung CA 4 years ago with no follow-up care or treatment; EMS placed on 2L nasal cannula for comfort. Pt is compliant and nonviolent on arrival.   BP 160/palp HR 96 O2 95% RA

## 2022-09-27 NOTE — ED Notes (Signed)
IVC PAPERWORK NOW IN PROCESS

## 2022-09-28 DIAGNOSIS — U071 COVID-19: Secondary | ICD-10-CM | POA: Diagnosis not present

## 2022-09-28 DIAGNOSIS — F1994 Other psychoactive substance use, unspecified with psychoactive substance-induced mood disorder: Secondary | ICD-10-CM

## 2022-09-28 LAB — ACETAMINOPHEN LEVEL: Acetaminophen (Tylenol), Serum: <10 ug/mL — ABNORMAL LOW (ref 10–30)

## 2022-09-28 MED ORDER — LEVETIRACETAM 500 MG PO TABS
500.0000 mg | ORAL_TABLET | Freq: Two times a day (BID) | ORAL | Status: DC
  Administered 2022-09-28 (×2): 500 mg via ORAL
  Filled 2022-09-28 (×2): qty 1

## 2022-09-28 MED ORDER — SUCRALFATE 1 G PO TABS
1.0000 g | ORAL_TABLET | Freq: Once | ORAL | Status: AC
  Administered 2022-09-28: 1 g via ORAL
  Filled 2022-09-28: qty 1

## 2022-09-28 MED ORDER — RISPERIDONE 1 MG PO TABS
0.5000 mg | ORAL_TABLET | Freq: Two times a day (BID) | ORAL | Status: DC
  Administered 2022-09-28 (×2): 0.5 mg via ORAL
  Filled 2022-09-28 (×2): qty 1

## 2022-09-28 MED ORDER — ALBUTEROL SULFATE HFA 108 (90 BASE) MCG/ACT IN AERS: 2.0000 | INHALATION_SPRAY | RESPIRATORY_TRACT | Status: DC | PRN

## 2022-09-28 MED ORDER — ONDANSETRON 4 MG PO TBDP
4.0000 mg | ORAL_TABLET | Freq: Once | ORAL | Status: AC
  Administered 2022-09-28: 4 mg via ORAL
  Filled 2022-09-28: qty 1

## 2022-09-28 MED ORDER — ALPRAZOLAM 0.25 MG PO TABS
0.2500 mg | ORAL_TABLET | Freq: Once | ORAL | Status: AC
  Administered 2022-09-28: 0.25 mg via ORAL
  Filled 2022-09-28: qty 1

## 2022-09-28 MED ORDER — TRAZODONE HCL 50 MG PO TABS: 50.0000 mg | ORAL_TABLET | Freq: Every evening | ORAL | Status: DC | PRN

## 2022-09-28 MED ORDER — FLUOXETINE HCL 10 MG PO CAPS
10.0000 mg | ORAL_CAPSULE | Freq: Every day | ORAL | Status: DC
  Administered 2022-09-28: 10 mg via ORAL
  Filled 2022-09-28: qty 1

## 2022-09-28 NOTE — ED Notes (Signed)
TTS completed. Roselyn Bering, NP recommends inpatient psychiatric treatment. [facility] and [facility] will be contacted for possible admission.

## 2022-09-28 NOTE — Consult Note (Signed)
[facility] ED ASSESSMENT   Reason for Consult:  Eval Referring Physician:  Uvaldo Rising Patient Identification: Raymond [name] MRN:  [id number] ED Chief Complaint: Substance induced mood disorder (HCC)  Diagnosis:  Principal Problem:   Substance induced mood disorder (HCC) Active Problems:   Traumatic brain injury (HCC)   Seizure disorder [facility])   ED Assessment Time Calculation: Start Time: 1000 Stop Time: 1100 Total Time in Minutes (Assessment Completion): 60   HPI:   Raymond [name] is a 42 y.o. male patient with history of TBI, seizures, and polysubstance abuse who presented to [facility] for suicidal ideations due to the 2 year anniversary of his mother's death. Pt was at the [facility], and reportedly told a Chemical engineer he had overdosed on his medications, they called 911 for evaluation. Pt unable to give details about the medications he took, it was unclear if patient actually took these medications or not. Pt recently discharged from [facility] IP on 09/26/22 for similar symptoms.   Subjective:   Pt seen this morning at [facility] for face to face psychiatric evaluation. Pt is sitting in bed, appears in no acute distress, and clarifies the events of yesterday. Pt denies overdosing on medications yesterday, denies any suicide attempt happening yesterday. He states "so I have my old medications and my new medications. I felt like my new ones weren't working, and then I took my old ones, and then I realized maybe I took to many pills and got worried and told he [facility] that maybe I took to many and they called 911." Pt does endorse increased depression around this time of year due to the anniversary of his mother's death, but denies any current suicidal ideations. Denies any plans or intent. Denies HI. Denies AVH. Denies problems with sleep or appetite. Pt is requesting discharge.   He appears pleasant and cooperative. No signs of acute distress, psychosis, or mania. Does not appear to be RTIS. He reports having his medications of  Prozac, Risperidone, and Keppra and states he does not need refills. Encouraged him to discard of old medications that he is not suppose to be taking and he agreed. Inpatient was offered, but patient declined preferring to return home with a bus pass. He does not meet criteria for [facility] IVC, will psychiatrically clear patient.   Past Psychiatric History:  See above  Risk to Self or Others: Is the patient at risk to self? No Has the patient been a risk to self in the past 6 months? No Has the patient been a risk to self within the distant past? No Is the patient a risk to others? No Has the patient been a risk to others in the past 6 months? No Has the patient been a risk to others within the distant past? No  [place] Scale:  Flowsheet Row ED from 09/27/2022 in [facility] Emergency Department at [facility] Admission (Discharged) from 09/22/2022 in [facility] INPATIENT BEHAVIORAL MEDICINE ED from 09/20/2022 in [facility] Emergency Department at [facility]  C-SSRS RISK CATEGORY High Risk No Risk High Risk       AIMS:  , , ,  ,   ASAM: ASAM Multidimensional Assessment Summary Dimension 1:  Description of individual's past and current experiences of substance use and withdrawal: Pt acknowledges history of using methamphetamines and cannabis. Denies recent use DImension 1:  Acute Intoxication and/or Withdrawal Potential Severity Rating: None Dimension 2:  Description of patient's biomedical conditions and  complications: Pt has history of seizures and TBI Dimension 2:  Biomedical Conditions and Complications Severity Rating: Severe Dimension 3:  Description of emotional, behavioral, or cognitive conditions and complications: Pt has chronic mental health issues Dimension 3:  Emotional, behavioral or cognitive (EBC) conditions and complications severity rating: Moderate Dimension 4:  Description of Readiness to Change criteria: Pt does not identify substance use as a concern Dimension  4:  Readiness to Change Severity Rating: None Dimension 5:  Relapse, continued use, or continued problem potential critiera description: Pt does not identify substance use as a concern Dimension 5:  Relapse, continued use, or continued problem potential severity rating: None Dimension 6:  Recovery/Iiving environment criteria description: Lives alone in apartment Dimension 6:  Recovery/living environment severity rating: Mild ASAM's Severity Rating Score: 6 ASAM Recommended Level of Treatment: Level I Outpatient Treatment  Substance Abuse:  Alcohol / Drug Use Pain Medications: see MAR Prescriptions: see MAR Over the Counter: see MAR History of alcohol / drug use?: Yes (Per medical record, Pt has history of using methamphetamines and cannabis) Longest period of sobriety (when/how long): Unknown Negative Consequences of Use:  (Pt denies) Withdrawal Symptoms: None  Past Medical History:  Past Medical History:  Diagnosis Date   Anxiety    Hepatitis C    PTSD (post-traumatic stress disorder)     Past Surgical History:  Procedure Laterality Date   FACIAL FRACTURE SURGERY     metal plate under right eye   Family History: No family history on file.  Social History:  Social History   Substance and Sexual Activity  Alcohol Use Not Currently     Social History   Substance and Sexual Activity  Drug Use Not Currently    Social History   Socioeconomic History   Marital status: Single    Spouse name: Not on file   Number of children: Not on file   Years of education: Not on file   Highest education level: Not on file  Occupational History   Not on file  Tobacco Use   Smoking status: Some Days    Types: Cigarettes   Smokeless tobacco: Never  Vaping Use   Vaping Use: Never used  Substance and Sexual Activity   Alcohol use: Not Currently   Drug use: Not Currently   Sexual activity: Not on file  Other Topics Concern   Not on file  Social History Narrative   Not on file    Social Determinants of Health   Financial Resource Strain: Not on file  Food Insecurity: No Food Insecurity (09/22/2022)   Hunger Vital Sign    Worried About Running Out of Food in the Last Year: Never true    Ran Out of Food in the Last Year: Never true  Recent Concern: Food Insecurity - Food Insecurity Present (08/13/2022)   Hunger Vital Sign    Worried About Running Out of Food in the Last Year: Sometimes true    Ran Out of Food in the Last Year: Sometimes true  Transportation Needs: No Transportation Needs (09/22/2022)   PRAPARE - Administrator, Civil Service (Medical): No    Lack of Transportation (Non-Medical): No  Physical Activity: Not on file  Stress: Not on file  Social Connections: Not on file   Additional Social History:    Allergies:   Allergies  Allergen Reactions   Other Itching and Rash    Peanuts    Labs:  Results for orders placed or performed during the hospital encounter of 09/27/22 (from the past 48 hour(s))  Comprehensive metabolic  panel     Status: Abnormal   Collection Time: 09/27/22  8:22 PM  Result Value Ref Range   Sodium 136 135 - 145 mmol/L   Potassium 3.4 (L) 3.5 - 5.1 mmol/L   Chloride 105 98 - 111 mmol/L   CO2 18 (L) 22 - 32 mmol/L   Glucose, Bld 106 (H) 70 - 99 mg/dL    Comment: Glucose reference range applies only to samples taken after fasting for at least 8 hours.   BUN 10 6 - 20 mg/dL   Creatinine, Ser 1.61 0.61 - 1.24 mg/dL   Calcium 8.7 (L) 8.9 - 10.3 mg/dL   Total Protein 6.9 6.5 - 8.1 g/dL   Albumin 3.8 3.5 - 5.0 g/dL   AST 23 15 - 41 U/L   ALT 32 0 - 44 U/L   Alkaline Phosphatase 96 38 - 126 U/L   Total Bilirubin 0.3 0.3 - 1.2 mg/dL   GFR, Estimated >09 >60 mL/min    Comment: (NOTE) Calculated using the CKD-EPI Creatinine Equation (2021)    Anion gap 13 5 - 15    Comment: Performed at [facility], [address], [place], [place] [address]  Ethanol     Status: Abnormal   Collection Time: 09/27/22   8:22 PM  Result Value Ref Range   Alcohol, Ethyl (B) 47 (H) <10 mg/dL    Comment: (NOTE) Lowest detectable limit for serum alcohol is 10 mg/dL.  For medical purposes only. Performed at [facility], [address], [place], [place] [address]   CBC with Diff     Status: None   Collection Time: 09/27/22  8:22 PM  Result Value Ref Range   WBC 9.2 4.0 - 10.5 K/uL   RBC 4.83 4.22 - 5.81 MIL/uL   Hemoglobin 14.9 13.0 - 17.0 g/dL   HCT 47.8 29.5 - 62.1 %   MCV 92.8 80.0 - 100.0 fL   MCH 30.8 26.0 - 34.0 pg   MCHC 33.3 30.0 - 36.0 g/dL   RDW 30.8 65.7 - 84.6 %   Platelets 298 150 - 400 K/uL   nRBC 0.0 0.0 - 0.2 %   Neutrophils Relative % 72 %   Neutro Abs 6.6 1.7 - 7.7 K/uL   Lymphocytes Relative 20 %   Lymphs Abs 1.9 0.7 - 4.0 K/uL   Monocytes Relative 7 %   Monocytes Absolute 0.7 0.1 - 1.0 K/uL   Eosinophils Relative 1 %   Eosinophils Absolute 0.1 0.0 - 0.5 K/uL   Basophils Relative 0 %   Basophils Absolute 0.0 0.0 - 0.1 K/uL   Immature Granulocytes 0 %   Abs Immature Granulocytes 0.04 0.00 - 0.07 K/uL    Comment: Performed at [facility], [address], [place], [place] [address]  Salicylate level     Status: Abnormal   Collection Time: 09/27/22  8:22 PM  Result Value Ref Range   Salicylate Lvl <7.0 (L) 7.0 - 30.0 mg/dL    Comment: Performed at [facility], [address] [place], [place] [address]  Acetaminophen level     Status: Abnormal   Collection Time: 09/27/22  8:22 PM  Result Value Ref Range   Acetaminophen (Tylenol), Serum <10 (L) 10 - 30 ug/mL    Comment: (NOTE) Therapeutic concentrations vary significantly. A range of 10-30 ug/mL  may be an effective concentration for many patients. However, some  are best treated at concentrations outside of this range. Acetaminophen concentrations >  150 ug/mL at 4 hours after ingestion  and >50 ug/mL at 12 hours after ingestion are often associated with  toxic reactions.  Performed at [facility], [address], [place], [place] [address]   Troponin I (High Sensitivity)     Status: None   Collection Time: 09/27/22  8:22 PM  Result Value Ref Range   Troponin I (High Sensitivity) 3 <18 ng/L    Comment: (NOTE) Elevated high sensitivity troponin I (hsTnI) values and significant  changes across serial measurements may suggest ACS but many other  chronic and acute conditions are known to elevate hsTnI results.  Refer to the "Links" section for chest pain algorithms and additional  guidance. Performed at [facility], [address] [place], [place] [address]   SARS Coronavirus 2 by RT PCR (hospital order, performed in [facility] hospital lab) *cepheid single result test* Anterior Nasal Swab     Status: Abnormal   Collection Time: 09/27/22  8:23 PM   Specimen: Anterior Nasal Swab  Result Value Ref Range   SARS Coronavirus 2 by RT PCR POSITIVE (A) NEGATIVE    Comment: Performed at [facility], [address] [place], [place] [address]  Urine rapid drug screen (hosp performed)     Status: Abnormal   Collection Time: 09/27/22  8:58 PM  Result Value Ref Range   Opiates NONE DETECTED NONE DETECTED   Cocaine NONE DETECTED NONE DETECTED   Benzodiazepines NONE DETECTED NONE DETECTED   Amphetamines NONE DETECTED NONE DETECTED   Tetrahydrocannabinol POSITIVE (A) NONE DETECTED   Barbiturates NONE DETECTED NONE DETECTED    Comment: (NOTE) DRUG SCREEN FOR MEDICAL PURPOSES ONLY.  IF CONFIRMATION IS NEEDED FOR ANY PURPOSE, NOTIFY LAB WITHIN 5 DAYS.  LOWEST DETECTABLE LIMITS FOR URINE DRUG SCREEN Drug Class                     Cutoff (ng/mL) Amphetamine and metabolites    1000 Barbiturate and metabolites    200 Benzodiazepine                 200 Opiates and metabolites        300 Cocaine and metabolites        300 THC                            50 Performed at [facility], [address] [place], [place] [address]   Acetaminophen level     Status:  Abnormal   Collection Time: 09/28/22  1:32 AM  Result Value Ref Range   Acetaminophen (Tylenol), Serum <10 (L) 10 - 30 ug/mL    Comment: (NOTE) Therapeutic concentrations vary significantly. A range of 10-30 ug/mL  may be an effective concentration for many patients. However, some  are best treated at concentrations outside of this range. Acetaminophen concentrations >150 ug/mL at 4 hours after ingestion  and >50 ug/mL at 12 hours after ingestion are often associated with  toxic reactions.  Performed at [facility], [address] [place], [place] [address]     Current Facility-Administered Medications  Medication Dose Route Frequency Provider Last Rate Last Admin   albuterol (VENTOLIN HFA) 108 (90 Base) MCG/ACT inhaler 2 puff  2 puff Inhalation Q4H PRN Carroll Sage, PA-C       FLUoxetine (PROZAC) capsule 10 mg  10 mg Oral Daily Carroll Sage, PA-C  10 mg at 09/28/22 0842   levETIRAcetam (KEPPRA) tablet 500 mg  500 mg Oral BID Carroll Sage, PA-C   500 mg at 09/28/22 1610   risperiDONE (RISPERDAL) tablet 0.5 mg  0.5 mg Oral BID Carroll Sage, PA-C   0.5 mg at 09/28/22 9604   traZODone (DESYREL) tablet 50 mg  50 mg Oral QHS PRN Carroll Sage, PA-C       Current Outpatient Medications  Medication Sig Dispense Refill   albuterol (VENTOLIN HFA) 108 (90 Base) MCG/ACT inhaler Inhale 2 puffs into the lungs every 4 (four) hours as needed for wheezing or shortness of breath. 6.7 g 0   FLUoxetine (PROZAC) 10 MG capsule Take 1 capsule (10 mg total) by mouth daily. 30 capsule 0   levETIRAcetam (KEPPRA) 500 MG tablet Take 1 tablet (500 mg total) by mouth 2 (two) times daily. 60 tablet 0   risperiDONE (RISPERDAL) 0.5 MG tablet Take 1 tablet (0.5 mg total) by mouth 2 (two) times daily. 60 tablet 0   traZODone (DESYREL) 50 MG tablet Take 1 tablet (50 mg total) by mouth at bedtime as needed for sleep. 30 tablet 0   Psychiatric Specialty Exam: Presentation   General Appearance:  Appropriate for Environment  Eye Contact: Fair  Speech: Clear and Coherent  Speech Volume: Normal  Handedness:No data recorded  Mood and Affect  Mood: Euthymic  Affect: Congruent   Thought Process  Thought Processes: Coherent  Descriptions of Associations:Intact  Orientation:Full (Time, Place and Person)  Thought Content:WDL  History of Schizophrenia/Schizoaffective disorder:No  Duration of Psychotic Symptoms:No data recorded Hallucinations:Hallucinations: None  Ideas of Reference:None  Suicidal Thoughts:Suicidal Thoughts: No  Homicidal Thoughts:Homicidal Thoughts: No   Sensorium  Memory: Immediate Fair; Recent Fair  Judgment: Fair  Insight: Fair   Art therapist  Concentration: Good  Attention Span: Good  Recall: Good  Fund of Knowledge: Good  Language: Good   Psychomotor Activity  Psychomotor Activity: Psychomotor Activity: Normal   Assets  Assets: Desire for Improvement; Leisure Time; Physical Health; Resilience    Sleep  Sleep: Sleep: Fair   Physical Exam: Physical Exam Neurological:     Mental Status: He is alert and oriented to person, place, and time.  Psychiatric:        Attention and Perception: Attention normal.        Mood and Affect: Mood normal.        Speech: Speech normal.        Behavior: Behavior is cooperative.        Thought Content: Thought content normal.    Review of Systems  Psychiatric/Behavioral:  Positive for depression and substance abuse.   All other systems reviewed and are negative.  Blood pressure 119/73, pulse 82, temperature 98.1 F (36.7 C), temperature source Oral, resp. rate 16, height 6\' 1"  (1.854 m), weight 76.2 kg, SpO2 100 %. Body mass index is 22.16 kg/m.  Medical Decision Making: Pt case reviewed and discussed with Dr. Lucianne Muss. Pt is able to contract for safety at this time, denies SI/HI/AVH. Inpatient admission was still offered, but patient  declined. He does not meet IVC criteria, will psychiatrically clear patient.   - return precautions discussed, went over resources for mental health crisis such as [facility], added additional resources to AVS. Pt denies needing medication refill at this time.  Disposition: No evidence of imminent risk to self or others at present.   Patient does not meet criteria for psychiatric inpatient admission. Supportive therapy provided about ongoing stressors.  Discussed crisis plan, support from social network, calling 911, coming to the Emergency Department, and calling Suicide Hotline. Psych cleared  Eligha Bridegroom, NP 09/28/2022 10:37 AM

## 2022-09-28 NOTE — ED Notes (Signed)
IVC paperwork completed in purple zone clipboard . IVC' D 09/27/22 Exp. 10/04/22.

## 2022-09-28 NOTE — ED Notes (Addendum)
Psych at bedside.

## 2022-09-28 NOTE — ED Notes (Signed)
Pt given [place] sandwich and beverage

## 2022-09-28 NOTE — ED Notes (Signed)
IVC paperwork completed, expires 10/04/22

## 2022-09-28 NOTE — ED Notes (Signed)
MD at bedside for patient evaluation.
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
## 2022-09-28 NOTE — Discharge Instructions (Signed)
  Outpatient psychiatric Services  Walk in hours for medication management Monday, Wednesday, Thursday, and Friday from 8:00 AM to 11:00 AM Recommend arriving by by 7:30 AM.  It is first come first serve.    Walk in hours for therapy intake Monday and Wednesday only 8:00 AM to 11:00 AM Encouraged to arrive by 7:30 AM.  It is first come first serve   Inpatient patient psychiatric services The [facility] offers comprehensive behavioral heath care services for mental health and substance abuse treatment.  Social work can also assist with referral to or getting you into a rehabilitation program short or long term         [facility]  Hours Monday - Friday: Services: 8:00AM - 3:00PM Offices: 8:00AM - 5:00PM  Physical Address [address] [place], [place] [address]   Please use this address for [facility] Mailing Address [address] [address] , [place] [address]  The [facility] helps people reconnect This is a safe place to rest, take care of basic needs and access the services and community that make all the difference. Our guests come to the [facility] to take a class, do laundry, meet with a case manager or to get their mail. Sometimes they just need to sit in our dayroom and enjoy a conversation.  Here you will find everything from shower facilities to a computer lab, a mail room, classrooms and meeting spaces.  The [facility] helps people reconnect with their own lives and with the community at large.  A caring community setting One of the most exciting aspects of the [facility] is that so many individuals and organizations in the community are a part of the everyday experience. Whether it's a hair stylist or law firm offering services right in-house, our partners make the IRC a truly interactive resource center where services are brought to our guests. The [facility] brings together a comprehensive community of talented people who not only want to help solve problems, but also to be a  part of our guests' lives.  Integrated Care We take a person-centered approach to assistance that includes: Case management [facility] Mental health nurse Referrals  Fundamental Services We start with necessities: Showers and hygiene supplies Laundry Phone access Mailing addresses and mailboxes Replacement IDs Onsite barbershop Storage lockers [place] winter warming center  Self-Sufficiency We connect our guests with: Skilled trade classes Job skills classes Resume and jobs application assistance Interview training GED classes Professional clothing vouchers Financial literacy  

## 2022-09-28 NOTE — ED Notes (Signed)
Md made aware that patient reported SOB

## 2022-09-28 NOTE — ED Provider Notes (Signed)
Emergency Medicine Observation Re-evaluation Note  Raymond [name] is a 42 y.o. male, seen on rounds today.  Pt initially presented to the ED for complaints of Suicidal and IVC Currently, the patient is resting comfortably. Patient states that he feels a lot better today.  He has been assessed by psychiatry team.  He states that his mom passed away 2 years ago, therefore he is going through a rough patch.  He was given the option of going to [place], but he has declined, as he thinks that he is feeling better and he can go to his own place and take all his medications.  Physical Exam  BP 119/73 (BP Location: Right Arm)   Pulse 82   Temp 98.1 F (36.7 C) (Oral)   Resp 16   Ht 6\' 1"  (1.854 m)   Wt 76.2 kg   SpO2 100%   BMI 22.16 kg/m  Physical Exam General: No acute distress. Cardiac: Normal rate Lungs: No respiratory distress Psych: Calm  ED Course / MDM  EKG:EKG Interpretation  Date/Time:  Saturday September 27 2022 20:36:23 EDT Ventricular Rate:  85 PR Interval:  164 QRS Duration: 88 QT Interval:  360 QTC Calculation: 428 R Axis:   14 Text Interpretation: Normal sinus rhythm Normal ECG When compared with ECG of 20-Sep-2022 18:30, No significant change was found Confirmed by Dione Booze ([id number]) on 09/28/2022 5:10:01 AM  I have reviewed the labs performed to date as well as medications administered while in observation.  Recent changes in the last 24 hours include -patient was seen by psychiatry team, stable for discharge.  They have staffed the patient and provided resources..  Plan  Current plan is for to be discharged.  Patient is comfortable with the plan.    Derwood Kaplan, MD 09/28/22 [phone]

## 2022-09-28 NOTE — ED Notes (Signed)
TTS consult in progress. °

## 2022-09-28 NOTE — ED Provider Notes (Signed)
Received at shift change from brinti henderly [facility] see her note for full detail  In short patient with medical history including TBI, seizures, polysubstance use, presents with suicidal attempt.  In coming from [facility] this patient's told staff that plan to kill himself, states he took 20 pills to end his life, unknown known of what pills he took but states that he took them sometime earlier yesterday, unclear if patient actually took these medications or not.  Patient has no complaints at this time.  Per previous provider follow-up on repeat Tylenol level if negative he is medically cleared and await TTS recommendations he is IVC. Physical Exam  BP 119/73 (BP Location: Right Arm)   Pulse 82   Temp 98.1 F (36.7 C) (Oral)   Resp 16   Ht 6\' 1"  (1.854 m)   Wt 76.2 kg   SpO2 100%   BMI 22.16 kg/m   Physical Exam Vitals and nursing note reviewed.  Constitutional:      General: He is not in acute distress.    Appearance: Normal appearance. He is not ill-appearing.  HENT:     Head: Normocephalic and atraumatic.     Nose: No congestion or rhinorrhea.  Eyes:     Conjunctiva/sclera: Conjunctivae normal.  Pulmonary:     Effort: Pulmonary effort is normal.     Breath sounds: Normal breath sounds.  Musculoskeletal:     Cervical back: Neck supple.  Skin:    General: Skin is warm and dry.  Neurological:     Mental Status: He is alert.  Psychiatric:        Mood and Affect: Mood normal.     Procedures  Procedures  ED Course / MDM   Clinical Course as of 09/28/22 0234  Sat Sep 27, 2022  2159 I was consulted to IVC the patient. [CC]    Clinical Course User Index [CC] Glyn Ade, MD   Medical Decision Making Amount and/or Complexity of Data Reviewed Labs: ordered. Radiology: ordered.  Risk Prescription drug management.    Lab Tests:  I Ordered, and personally interpreted labs.  The pertinent results include: CBC unremarkable, CMP reveals potassium 3.4, CO2 of 18,  glucose 106, ethanol 47, salicylate 7.0, acetaminophen less than 10, troponin is 3, urine drug screen positive for cannabis, COVID test positive   Imaging Studies ordered:  I ordered imaging studies including chest x-ray I independently visualized and interpreted imaging which showed is unremarkable I agree with the radiologist interpretation   Cardiac Monitoring:  The patient was maintained on a cardiac monitor.  I personally viewed and interpreted the cardiac monitored which showed an underlying rhythm of: EKG without signs of ischemia   Medicines ordered and prescription drug management:  I ordered medication including N/A I have reviewed the patients home medicines and have made adjustments as needed  Critical Interventions:  N/A   Reevaluation:  Patient is a patient resting comfortably having no complaints, he states that he took 10 pills of his own medication, does not know which ones he took but states he took them yesterday morning around 9 AM.  Repeat lab work is unremarkable he is medically cleared will await TTS recommendations  Consultations Obtained:  N/A    Test Considered:  N/A   Dispostion and problem list  After consideration of the diagnostic results and the patients response to treatment, I feel that the patent would benefit from will place patient in psych hold, he is IVC ,  home meds have been ordered,  will await TTS recommendations.Carroll Sage, PA-C 09/28/22 0234    Sabas Sous, MD 09/28/22 [phone]

## 2022-09-29 LAB — LEVETIRACETAM LEVEL: Levetiracetam Lvl: <2 ug/mL — ABNORMAL LOW (ref 10.0–40.0)

## 2022-10-07 ENCOUNTER — Other Ambulatory Visit: Payer: Self-pay

## 2022-10-07 ENCOUNTER — Encounter (HOSPITAL_COMMUNITY): Payer: Self-pay

## 2022-10-07 ENCOUNTER — Emergency Department (HOSPITAL_COMMUNITY)
Admission: EM | Admit: 2022-10-07 | Discharge: 2022-10-08 | Disposition: A | Discharge status: Other Acute Inpatient Hospital | Payer: No Typology Code available for payment source | Attending: Emergency Medicine | Admitting: Emergency Medicine

## 2022-10-07 ENCOUNTER — Emergency Department (HOSPITAL_COMMUNITY): Payer: No Typology Code available for payment source

## 2022-10-07 DIAGNOSIS — Z79899 Other long term (current) drug therapy: Secondary | ICD-10-CM | POA: Diagnosis not present

## 2022-10-07 DIAGNOSIS — F329 Major depressive disorder, single episode, unspecified: Secondary | ICD-10-CM | POA: Insufficient documentation

## 2022-10-07 DIAGNOSIS — F29 Unspecified psychosis not due to a substance or known physiological condition: Secondary | ICD-10-CM | POA: Diagnosis not present

## 2022-10-07 DIAGNOSIS — F32A Depression, unspecified: Secondary | ICD-10-CM | POA: Diagnosis present

## 2022-10-07 DIAGNOSIS — R45851 Suicidal ideations: Secondary | ICD-10-CM | POA: Diagnosis not present

## 2022-10-07 DIAGNOSIS — R4589 Other symptoms and signs involving emotional state: Secondary | ICD-10-CM

## 2022-10-07 LAB — CBC WITH DIFFERENTIAL/PLATELET
Abs Immature Granulocytes: 0.03 10*3/uL (ref 0.00–0.07)
Basophils Absolute: 0.1 10*3/uL (ref 0.0–0.1)
Basophils Relative: 1 %
Eosinophils Absolute: 0.1 10*3/uL (ref 0.0–0.5)
Eosinophils Relative: 1 %
HCT: 46.5 % (ref 39.0–52.0)
Hemoglobin: 15.6 g/dL (ref 13.0–17.0)
Immature Granulocytes: 0 %
Lymphocytes Relative: 19 %
Lymphs Abs: 1.5 10*3/uL (ref 0.7–4.0)
MCH: 31.1 pg (ref 26.0–34.0)
MCHC: 33.5 g/dL (ref 30.0–36.0)
MCV: 92.6 fL (ref 80.0–100.0)
Monocytes Absolute: 0.6 10*3/uL (ref 0.1–1.0)
Monocytes Relative: 8 %
Neutro Abs: 5.6 10*3/uL (ref 1.7–7.7)
Neutrophils Relative %: 71 %
Platelets: 307 10*3/uL (ref 150–400)
RBC: 5.02 MIL/uL (ref 4.22–5.81)
RDW: 14 % (ref 11.5–15.5)
WBC: 7.9 10*3/uL (ref 4.0–10.5)
nRBC: 0 % (ref 0.0–0.2)

## 2022-10-07 LAB — COMPREHENSIVE METABOLIC PANEL
ALT: 30 U/L (ref 0–44)
AST: 34 U/L (ref 15–41)
Albumin: 4.1 g/dL (ref 3.5–5.0)
Alkaline Phosphatase: 88 U/L (ref 38–126)
Anion gap: 10 (ref 5–15)
BUN: 9 mg/dL (ref 6–20)
CO2: 19 mmol/L — ABNORMAL LOW (ref 22–32)
Calcium: 8.8 mg/dL — ABNORMAL LOW (ref 8.9–10.3)
Chloride: 108 mmol/L (ref 98–111)
Creatinine, Ser: 0.7 mg/dL (ref 0.61–1.24)
GFR, Estimated: >60 mL/min (ref >60)
Glucose, Bld: 101 mg/dL — ABNORMAL HIGH (ref 70–99)
Potassium: 4.1 mmol/L (ref 3.5–5.1)
Sodium: 137 mmol/L (ref 135–145)
Total Bilirubin: 0.6 mg/dL (ref 0.3–1.2)
Total Protein: 7.1 g/dL (ref 6.5–8.1)

## 2022-10-07 LAB — RAPID URINE DRUG SCREEN, HOSP PERFORMED
Amphetamines: NOT DETECTED
Barbiturates: NOT DETECTED
Benzodiazepines: NOT DETECTED
Cocaine: NOT DETECTED
Opiates: NOT DETECTED
Tetrahydrocannabinol: POSITIVE — AB

## 2022-10-07 LAB — ETHANOL: Alcohol, Ethyl (B): <10 mg/dL (ref <10)

## 2022-10-07 NOTE — ED Provider Notes (Signed)
[facility] AT [facility] Provider Note   CSN: [id number] Arrival date & time: 10/07/22  1836     History  Chief Complaint  Patient presents with   Psychiatric Evaluation    Raymond [name] is a 42 y.o. male has medical history of substance use disorder and TBI presenting today due to suicidal ideations.  He reports that he was recently here and was supposed to go to a behavioral health facility in [place] but "the ride never came."  He said that he was feeling better however today he has felt increasingly depressed and anxious.  Says he has been having panic attacks.  Reports taking around 15 tablets of Keppra.  Says it was a suicide attempt.  No homicidal ideations, hallucinations or alcohol use.    HPI     Home Medications Prior to Admission medications   Medication Sig Start Date End Date Taking? Authorizing Provider  albuterol (VENTOLIN HFA) 108 (90 Base) MCG/ACT inhaler Inhale 2 puffs into the lungs every 4 (four) hours as needed for wheezing or shortness of breath. 09/25/22   Clapacs, Jackquline Denmark, MD  FLUoxetine (PROZAC) 10 MG capsule Take 1 capsule (10 mg total) by mouth daily. 09/26/22   Clapacs, Jackquline Denmark, MD  levETIRAcetam (KEPPRA) 500 MG tablet Take 1 tablet (500 mg total) by mouth 2 (two) times daily. 09/25/22   Clapacs, Jackquline Denmark, MD  risperiDONE (RISPERDAL) 0.5 MG tablet Take 1 tablet (0.5 mg total) by mouth 2 (two) times daily. 09/25/22   Clapacs, Jackquline Denmark, MD  traZODone (DESYREL) 50 MG tablet Take 1 tablet (50 mg total) by mouth at bedtime as needed for sleep. 09/25/22   Clapacs, Jackquline Denmark, MD      Allergies    Other    Review of Systems   Review of Systems  Physical Exam Updated Vital Signs BP (!) 139/111 (BP Location: Left Arm)   Pulse 79   Temp (!) 97.5 F (36.4 C) (Oral)   Resp 19   SpO2 96%  Physical Exam Vitals and nursing note reviewed.  Constitutional:      Appearance: Normal appearance.  HENT:     Head: Normocephalic and atraumatic.   Eyes:     General: No scleral icterus.    Conjunctiva/sclera: Conjunctivae normal.  Pulmonary:     Effort: Pulmonary effort is normal. No respiratory distress.  Skin:    Findings: No rash.  Neurological:     Mental Status: He is alert.     Comments: Pressured speech but alert and oriented.  Does not appear to be responding to internal stimuli  Psychiatric:        Mood and Affect: Mood normal.     ED Results / Procedures / Treatments   Labs (all labs ordered are listed, but only abnormal results are displayed) Labs Reviewed  RAPID URINE DRUG SCREEN, HOSP PERFORMED - Abnormal; Notable for the following components:      Result Value   Tetrahydrocannabinol POSITIVE (*)    All other components within normal limits  ETHANOL  CBC WITH DIFFERENTIAL/PLATELET  COMPREHENSIVE METABOLIC PANEL    EKG None  Radiology No results found.  Procedures Procedures   Medications Ordered in ED Medications - No data to display  ED Course/ Medical Decision Making/ A&P                             Medical Decision Making Amount and/or Complexity of Data  Reviewed Labs: ordered. Radiology: ordered.   42 year old male presenting today with suicidal ideations.  Reports a suicide attempt earlier today via multiple tablets of Keppra.  Per chart review patient was seen for the same at the end of April.  He was cleared by psych and was discharged.  Medical clearance: Remarkable for positive THC. CXR from his cough is negative.  Dispo: TTS to dispo   Final Clinical Impression(s) / ED Diagnoses Final diagnoses:  None    Rx / DC Orders ED Discharge Orders     None        Keyanni Whittinghill, Gabriel Cirri, PA-C 10/07/22 2145    Maia Plan, MD 10/08/22 [phone]

## 2022-10-07 NOTE — BH Assessment (Signed)
1957 TTS clinician reached out to RN's regarding if patient medically cleared for TTS, if so will proceed with assessment after doctors note is posted. No doctors note.  2019 TTS clinician asked if patient is medically cleared.  2033 Marchelle Folks, RN, I dont know if he's cleared yet- but he is ready for TTS. 2109 TTS clinician asked if patient medically cleared.  TTS clinician completing assessment now

## 2022-10-07 NOTE — ED Notes (Signed)
Pt moved to room 28 for TTS evaluation

## 2022-10-07 NOTE — ED Triage Notes (Signed)
BIBA c/o SI x 1 week. Pt reports taking 20 tablets of keppra last night and another 20 tablets this am @ 1000.  Pt reports normal dose is 10 tablets.  Pt seen for same on 09/28/22 and given a option to go to [facility] and refused.  Hx polysubstance abuse.

## 2022-10-07 NOTE — ED Notes (Signed)
Pt changed into purple scrubs and belongings were placed in the nurses station cabinet on the 16-18 side.

## 2022-10-08 ENCOUNTER — Encounter: Payer: Self-pay | Admitting: Psychiatry

## 2022-10-08 ENCOUNTER — Inpatient Hospital Stay (HOSPITAL_COMMUNITY): Admission: AD | Admit: 2022-10-08 | Payer: BLUE CROSS/BLUE SHIELD | Source: Home / Self Care | Admitting: Psychiatry

## 2022-10-08 ENCOUNTER — Inpatient Hospital Stay
Admission: AD | Admit: 2022-10-08 | Discharge: 2022-10-16 | DRG: 897 | Disposition: A | Discharge status: Home / Self Care | Payer: BLUE CROSS/BLUE SHIELD | Source: Intra-hospital | Attending: Psychiatry | Admitting: Psychiatry

## 2022-10-08 DIAGNOSIS — T426X2A Poisoning by other antiepileptic and sedative-hypnotic drugs, intentional self-harm, initial encounter: Secondary | ICD-10-CM | POA: Diagnosis present

## 2022-10-08 DIAGNOSIS — R45851 Suicidal ideations: Secondary | ICD-10-CM | POA: Diagnosis present

## 2022-10-08 DIAGNOSIS — F17210 Nicotine dependence, cigarettes, uncomplicated: Secondary | ICD-10-CM | POA: Diagnosis present

## 2022-10-08 DIAGNOSIS — J449 Chronic obstructive pulmonary disease, unspecified: Secondary | ICD-10-CM | POA: Diagnosis present

## 2022-10-08 DIAGNOSIS — I10 Essential (primary) hypertension: Secondary | ICD-10-CM | POA: Diagnosis present

## 2022-10-08 DIAGNOSIS — G40909 Epilepsy, unspecified, not intractable, without status epilepticus: Secondary | ICD-10-CM | POA: Diagnosis present

## 2022-10-08 DIAGNOSIS — S069XAA Unspecified intracranial injury with loss of consciousness status unknown, initial encounter: Secondary | ICD-10-CM | POA: Diagnosis not present

## 2022-10-08 DIAGNOSIS — F1994 Other psychoactive substance use, unspecified with psychoactive substance-induced mood disorder: Principal | ICD-10-CM | POA: Diagnosis present

## 2022-10-08 DIAGNOSIS — F329 Major depressive disorder, single episode, unspecified: Secondary | ICD-10-CM | POA: Insufficient documentation

## 2022-10-08 DIAGNOSIS — Z5941 Food insecurity: Secondary | ICD-10-CM

## 2022-10-08 DIAGNOSIS — Z87820 Personal history of traumatic brain injury: Secondary | ICD-10-CM | POA: Diagnosis not present

## 2022-10-08 MED ORDER — TRAZODONE HCL 50 MG PO TABS
50.0000 mg | ORAL_TABLET | Freq: Every evening | ORAL | Status: DC | PRN
  Administered 2022-10-10 – 2022-10-14 (×4): 50 mg via ORAL
  Filled 2022-10-08 (×5): qty 1

## 2022-10-08 MED ORDER — RISPERIDONE 1 MG PO TABS
0.5000 mg | ORAL_TABLET | Freq: Two times a day (BID) | ORAL | Status: DC
  Administered 2022-10-08 – 2022-10-09 (×2): 0.5 mg via ORAL
  Filled 2022-10-08 (×2): qty 1

## 2022-10-08 MED ORDER — LEVETIRACETAM 500 MG PO TABS
500.0000 mg | ORAL_TABLET | Freq: Two times a day (BID) | ORAL | Status: DC
  Administered 2022-10-08 – 2022-10-16 (×16): 500 mg via ORAL
  Filled 2022-10-08 (×17): qty 1

## 2022-10-08 MED ORDER — MAGNESIUM HYDROXIDE 400 MG/5ML PO SUSP: 30.0000 mL | Freq: Every day | ORAL | Status: DC | PRN

## 2022-10-08 MED ORDER — FLUOXETINE HCL 10 MG PO CAPS
10.0000 mg | ORAL_CAPSULE | Freq: Every day | ORAL | Status: DC
  Administered 2022-10-08 – 2022-10-09 (×2): 10 mg via ORAL
  Filled 2022-10-08 (×2): qty 1

## 2022-10-08 MED ORDER — DIPHENHYDRAMINE HCL 25 MG PO CAPS: 50.0000 mg | ORAL_CAPSULE | Freq: Three times a day (TID) | ORAL | Status: DC | PRN

## 2022-10-08 MED ORDER — DIPHENHYDRAMINE HCL 50 MG/ML IJ SOLN: 50.0000 mg | Freq: Three times a day (TID) | INTRAMUSCULAR | Status: DC | PRN

## 2022-10-08 MED ORDER — ALUM & MAG HYDROXIDE-SIMETH 200-200-20 MG/5ML PO SUSP: 30.0000 mL | ORAL | Status: DC | PRN

## 2022-10-08 MED ORDER — ACETAMINOPHEN 325 MG PO TABS
650.0000 mg | ORAL_TABLET | Freq: Four times a day (QID) | ORAL | Status: DC | PRN
  Filled 2022-10-08: qty 2

## 2022-10-08 MED ORDER — ALBUTEROL SULFATE HFA 108 (90 BASE) MCG/ACT IN AERS: 2.0000 | INHALATION_SPRAY | RESPIRATORY_TRACT | Status: DC | PRN

## 2022-10-08 NOTE — ED Notes (Signed)
This patient is accepted to [facility] by [name] to 319. DR Clapacs attending. Dx sub induced mood d/o; seizure d/o; Hx TBI.

## 2022-10-08 NOTE — Plan of Care (Signed)
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
## 2022-10-08 NOTE — ED Notes (Signed)
[facility] Dept for transport. Left a message.

## 2022-10-08 NOTE — Tx Team (Signed)
Initial Treatment Plan 10/08/2022 12:28 PM Raymond [name] ZOX:[id number]    PATIENT STRESSORS: Financial difficulties   Health problems   Medication change or noncompliance   Substance abuse   Traumatic event     PATIENT STRENGTHS: Ability for insight  Capable of independent living  Communication skills  General fund of knowledge    PATIENT IDENTIFIED PROBLEMS: TBI w/ seizures  Substance use disorder  Traumatic event  Depression and anxiety  Financial difficulties             DISCHARGE CRITERIA:  Ability to meet basic life and health needs Adequate post-discharge living arrangements Improved stabilization in mood, thinking, and/or behavior Medical problems require only outpatient monitoring Safe-care adequate arrangements made Verbal commitment to aftercare and medication compliance  PRELIMINARY DISCHARGE PLAN: Outpatient therapy Return to previous living arrangement  PATIENT/FAMILY INVOLVEMENT: This treatment plan has been presented to and reviewed with the patient, Raymond [name].  The patient and family have been given the opportunity to ask questions and make suggestions.  Delos Haring, RN 10/08/2022, 12:28 PM

## 2022-10-08 NOTE — H&P (Signed)
Psychiatric Admission Assessment Adult  Patient Identification: Raymond [name] MRN:  [id number] Date of Evaluation:  10/08/2022 Chief Complaint:   Principal Diagnosis: Traumatic brain injury [facility]) Diagnosis:  Principal Problem:   Traumatic brain injury [facility]) Active Problems:   Seizure disorder (HCC)   Substance induced mood disorder (HCC)  History of Present Illness: Patient seen and chart reviewed.  This is a 42 year old man with a history of traumatic brain injury who presented to the emergency room in [place] claiming that he had overdosed on 20 tablets of 500 mg Keppra.  In another note it was documented that he said he took about 15 tablets.  On interview with me today he tells me he took 12 pills and he does not know if they were all Keppra.  In any case no effort was made to evaluate this as note Keppra level was obtained in [place] and he was simply diverted to psychiatry.  Patient tells me today he has no thought about wanting to harm himself and no wish to die.  He says he continues to live in an apartment that has been provided for him by a service and [facility] and that he had continued to take his medicines as prescribed.  He admits however that he made no effort to follow up with any of the recommended mental health treatment that had been arranged last time he was here in the hospital.  Patient says that he took an overdose because he was feeling sad about his mother.  His mother passed away about 2 years ago and this seems to be his reason for multiple suicide attempts.  Patient's history is erratic and hard to follow.  He tells me at 1 point that he tried to "do away with myself" 4 times in the last week and then later tells me that he has no thought about killing himself at all.  He tells me that his big stress is his seizure but admits that he has not had any seizures recently.  He denies that he has been abusing any alcohol or drugs recently and his drug screen was negative this  time. 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  Patient states that he was in the [employer] and was given a honorable discharge because he had a traumatic brain injury.  We have not been able to confirm any of that.  He says that despite that he does not get a VA pension and does not follow-up with the [place] treatment.  Patient apparently used to live in [place] until relocating sometime in the last several months to [place].  Since he has been here he has had multiple presentations to the emergency room with similar complaints.  He was here in our hospital just about 2 or 3 weeks ago with a similar situation.  Apparently he had psychiatric hospitalizations in [place] but we have no access to those records.  Is the patient at risk to self? Yes.    Has the patient been a  risk to self in the past 6 months? Yes.    Has the patient been a risk to self within the distant past? Yes.    Is the patient a risk to others? No.  Has the patient been a risk to others in the past 6 months? No.  Has the patient been a risk to others within the distant past? No.   [place] Scale:  Flowsheet Row Admission (Current) from 10/08/2022 in [facility] INPATIENT BEHAVIORAL MEDICINE ED from 10/07/2022 in [facility] Emergency Department at [facility] ED from 09/27/2022 in [facility] Emergency Department at [facility]  C-SSRS RISK  CATEGORY Error: Q3, 4, or 5 should not be populated when Q2 is No High Risk High Risk        Prior Inpatient Therapy: Yes.   If yes, describe 1 prior admission here just 2 or 3 weeks ago.  Apparently he has had previous hospitalizations as well Prior Outpatient Therapy: Yes.   If yes, describe he has been referred to outpatient treatment locally but is not following up with it.  He says that he did have treatment when he lived in [place]  Alcohol Screening:   Substance Abuse History in the last 12 months:  Yes.   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  This time his drug screen was negative. Previous Psychotropic Medications: Yes  Psychological Evaluations: Yes  Past Medical History:  Past Medical History:  Diagnosis Date   Anxiety    Hepatitis C    PTSD (post-traumatic stress disorder)     Past Surgical History:  Procedure Laterality Date   FACIAL FRACTURE SURGERY     metal plate under right eye   Family History: History reviewed. No pertinent family history. Family Psychiatric  History: Unknown Tobacco Screening:  Social History   Tobacco Use  Smoking Status Some Days   Types: Cigarettes  Smokeless Tobacco Never    BH Tobacco Counseling     Are you interested in Tobacco Cessation Medications?  No, patient refused Counseled patient on smoking cessation:  N/A, patient does not use tobacco products Reason Tobacco Screening Not Completed: No value filed.       Social History:  Social History   Substance and Sexual Activity  Alcohol Use Not Currently     Social History   Substance and Sexual Activity  Drug Use Not Currently    Additional Social History:                           Allergies:   Allergies  Allergen Reactions   Other Itching and Rash    Peanuts   Lab Results:  Results for orders placed or performed during the hospital encounter of 10/07/22 (from the past  48 hour(s))  Comprehensive metabolic panel     Status: Abnormal   Collection Time: 10/07/22  7:33 PM  Result Value Ref Range   Sodium 137 135 - 145 mmol/L   Potassium 4.1 3.5 - 5.1 mmol/L   Chloride 108 98 - 111 mmol/L   CO2 19 (L) 22 - 32 mmol/L   Glucose, Bld 101 (H) 70 - 99 mg/dL    Comment: Glucose reference range applies only to samples taken after fasting for at least 8 hours.   BUN 9 6 - 20 mg/dL   Creatinine, Ser 1.61 0.61 - 1.24 mg/dL   Calcium 8.8 (L) 8.9 - 10.3 mg/dL  Total Protein 7.1 6.5 - 8.1 g/dL   Albumin 4.1 3.5 - 5.0 g/dL   AST 34 15 - 41 U/L   ALT 30 0 - 44 U/L   Alkaline Phosphatase 88 38 - 126 U/L   Total Bilirubin 0.6 0.3 - 1.2 mg/dL   GFR, Estimated >16 >10 mL/min    Comment: (NOTE) Calculated using the CKD-EPI Creatinine Equation (2021)    Anion gap 10 5 - 15    Comment: Performed at [facility], [address], [place], [place] [address]  Urine rapid drug screen (hosp performed)     Status: Abnormal   Collection Time: 10/07/22  7:33 PM  Result Value Ref Range   Opiates NONE DETECTED NONE DETECTED   Cocaine NONE DETECTED NONE DETECTED   Benzodiazepines NONE DETECTED NONE DETECTED   Amphetamines NONE DETECTED NONE DETECTED   Tetrahydrocannabinol POSITIVE (A) NONE DETECTED   Barbiturates NONE DETECTED NONE DETECTED    Comment: (NOTE) DRUG SCREEN FOR MEDICAL PURPOSES ONLY.  IF CONFIRMATION IS NEEDED FOR ANY PURPOSE, NOTIFY LAB WITHIN 5 DAYS.  LOWEST DETECTABLE LIMITS FOR URINE DRUG SCREEN Drug Class                     Cutoff (ng/mL) Amphetamine and metabolites    1000 Barbiturate and metabolites    200 Benzodiazepine                 200 Opiates and metabolites        300 Cocaine and metabolites        300 THC                            50 Performed at [facility], [address], [place], [place] [address]   CBC with Diff     Status: None   Collection Time: 10/07/22  7:33 PM  Result Value Ref Range    WBC 7.9 4.0 - 10.5 K/uL   RBC 5.02 4.22 - 5.81 MIL/uL   Hemoglobin 15.6 13.0 - 17.0 g/dL   HCT 19.1 47.8 - 29.5 %   MCV 92.6 80.0 - 100.0 fL   MCH 31.1 26.0 - 34.0 pg   MCHC 33.5 30.0 - 36.0 g/dL   RDW 62.1 30.8 - 65.7 %   Platelets 307 150 - 400 K/uL   nRBC 0.0 0.0 - 0.2 %   Neutrophils Relative % 71 %   Neutro Abs 5.6 1.7 - 7.7 K/uL   Lymphocytes Relative 19 %   Lymphs Abs 1.5 0.7 - 4.0 K/uL   Monocytes Relative 8 %   Monocytes Absolute 0.6 0.1 - 1.0 K/uL   Eosinophils Relative 1 %   Eosinophils Absolute 0.1 0.0 - 0.5 K/uL   Basophils Relative 1 %   Basophils Absolute 0.1 0.0 - 0.1 K/uL   Immature Granulocytes 0 %   Abs Immature Granulocytes 0.03 0.00 - 0.07 K/uL    Comment: Performed at [facility], [address], [place], [place] [address]  Ethanol     Status: None   Collection Time: 10/07/22  8:02 PM  Result Value Ref Range   Alcohol, Ethyl (B) <10 <10 mg/dL    Comment: (NOTE) Lowest detectable limit for serum alcohol is 10 mg/dL.  For medical purposes only. Performed at [facility], [address], [place], [place] [address]     Blood Alcohol level:  Lab Results  Component Value Date   ETH <10 10/07/2022   ETH 47 (H) 09/27/2022    Metabolic Disorder Labs:  No results found for: "HGBA1C", "MPG" No results found for: "PROLACTIN" No results found for: "CHOL", "TRIG", "HDL", "CHOLHDL", "VLDL", "LDLCALC"  Current Medications: Current Facility-Administered Medications  Medication Dose Route Frequency Provider Last Rate Last Admin   acetaminophen (TYLENOL) tablet 650 mg  650 mg Oral Q6H PRN Lewis, Jerene Pitch, NP       albuterol (VENTOLIN HFA) 108 (90 Base) MCG/ACT inhaler 2 puff  2 puff Inhalation Q4H PRN Oneta Rack, NP       alum & mag hydroxide-simeth (MAALOX/MYLANTA) 200-200-20 MG/5ML suspension 30 mL  30 mL Oral Q4H PRN Oneta Rack, NP       diphenhydrAMINE (BENADRYL) capsule 50 mg  50 mg Oral TID PRN Oneta Rack, NP       Or   diphenhydrAMINE (BENADRYL) injection 50 mg  50 mg Intramuscular TID PRN Oneta Rack, NP       FLUoxetine (PROZAC) capsule 10 mg  10 mg Oral Daily Oneta Rack, NP   10 mg at 10/08/22 1335   levETIRAcetam (KEPPRA) tablet 500 mg  500 mg Oral BID Oneta Rack, NP       magnesium hydroxide (MILK OF MAGNESIA) suspension 30 mL  30 mL Oral Daily PRN Oneta Rack, NP       risperiDONE (RISPERDAL) tablet 0.5 mg  0.5 mg Oral BID Oneta Rack, NP       traZODone (DESYREL) tablet 50 mg  50 mg Oral QHS PRN Oneta Rack, NP       PTA Medications: Medications Prior to Admission  Medication Sig Dispense Refill Last Dose   albuterol (VENTOLIN HFA) 108 (90 Base) MCG/ACT inhaler Inhale 2 puffs into the lungs every 4 (four) hours as needed for wheezing or shortness of breath. 6.7 g 0    FLUoxetine (PROZAC) 10 MG capsule Take 1 capsule (10 mg total) by mouth daily. 30 capsule 0    levETIRAcetam (KEPPRA) 500 MG tablet Take 1 tablet (500 mg total) by mouth 2 (two) times daily. 60 tablet 0    risperiDONE (RISPERDAL) 0.5 MG tablet Take 1 tablet (0.5 mg total) by mouth 2 (two) times daily. 60 tablet 0    traZODone (DESYREL) 50 MG tablet Take 1 tablet (50 mg total) by mouth at bedtime as needed for sleep. 30 tablet 0     Musculoskeletal: Strength & Muscle Tone: within normal limits Gait & Station: normal Patient leans: N/A            Psychiatric Specialty Exam:  Presentation  General Appearance:  Appropriate for Environment  Eye Contact: Fair  Speech: Clear and Coherent  Speech Volume: Normal  Handedness:No data recorded  Mood and Affect  Mood: Euthymic  Affect: Congruent   Thought Process  Thought Processes: Coherent  Duration of Psychotic Symptoms:N/A Past Diagnosis of Schizophrenia or Psychoactive disorder: No  Descriptions of Associations:Intact  Orientation:Full (Time, Place and Person)  Thought  Content:WDL  Hallucinations:No data recorded Ideas of Reference:None  Suicidal Thoughts:No data recorded Homicidal Thoughts:No data recorded  Sensorium  Memory: Immediate Fair; Recent Fair  Judgment: Fair  Insight: Fair   Art therapist  Concentration: Good  Attention Span: Good  Recall: Good  Fund of Knowledge: Good  Language: Good   Psychomotor Activity  Psychomotor Activity:No data recorded  Assets  Assets: Desire for Improvement; Leisure Time; Physical Health; Resilience  Sleep  Sleep:No data recorded   Physical Exam: Physical Exam Vitals and nursing note reviewed.  Constitutional:      Appearance: Normal appearance.  HENT:     Head: Normocephalic and atraumatic.     Mouth/Throat:     Pharynx: Oropharynx is clear.  Eyes:     Pupils: Pupils are equal, round, and reactive to light.  Cardiovascular:     Rate and Rhythm: Normal rate and regular rhythm.  Pulmonary:     Effort: Pulmonary effort is normal.     Breath sounds: Normal breath sounds.  Abdominal:     General: Abdomen is flat.     Palpations: Abdomen is soft.  Musculoskeletal:        General: Normal range of motion.  Skin:    General: Skin is warm and dry.  Neurological:     General: No focal deficit present.     Mental Status: He is alert. Mental status is at baseline.  Psychiatric:        Attention and Perception: He is inattentive.        Mood and Affect: Mood normal. Affect is labile.        Speech: Speech is tangential.        Behavior: Behavior is agitated. Behavior is not aggressive.        Cognition and Memory: Cognition is impaired.        Judgment: Judgment is inappropriate.    Review of Systems  Constitutional: Negative.   HENT: Negative.    Eyes: Negative.   Respiratory: Negative.    Cardiovascular: Negative.   Gastrointestinal: Negative.   Musculoskeletal: Negative.   Skin: Negative.   Neurological: Negative.   Psychiatric/Behavioral:  Positive for  memory loss. Negative for depression, hallucinations, substance abuse and suicidal ideas. The patient is nervous/anxious.    Blood pressure (!) 164/112, pulse 84, temperature (!) 97 F (36.1 C), temperature source Oral, resp. rate 18, height 6\' 1"  (1.854 m), weight 74.8 kg, SpO2 99 %. Body mass index is 21.77 kg/m.  Treatment Plan Summary: Medication management and Plan patient who is a bit of a puzzle.  He says he has a traumatic brain injury but we have yet to be able to confirm that and do not have any psychological or neurologic testing to confirm particular deficits.  His presentation would be consistent with that and that he seems to get very impulsive very quickly and then do self damaging things like overdosing without thinking through the consequences.  As I mentioned above no effort was made to obtain a blood level of his Keppra or in any other way workup his purported overdose in [place].  He has been restarted on his Keppra here and I have ordered a blood level now.  Restart the previous antipsychotic and antidepressant although it is not clear how helpful that is going to be long-term.  Everything about this patient suggest that he probably is not safe to take care of himself but does not have any known resources for going anywhere else or getting any other kind of care.  Ongoing assessment of dangerousness in the hospital.  Observation Level/Precautions:  15 minute checks  Laboratory:  Chemistry Profile  Psychotherapy:    Medications:    Consultations:    Discharge Concerns:    Estimated LOS:  Other:     Physician Treatment Plan for Primary Diagnosis: Traumatic brain injury [facility]) Long Term Goal(s): Improvement in symptoms so as ready for discharge  Short Term Goals: Ability  to disclose and discuss suicidal ideas and Ability to demonstrate self-control will improve  Physician Treatment Plan for Secondary Diagnosis: Principal Problem:   Traumatic brain injury [facility]) Active  Problems:   Seizure disorder (HCC)   Substance induced mood disorder (HCC)  Long Term Goal(s): Improvement in symptoms so as ready for discharge  Short Term Goals: Ability to maintain clinical measurements within normal limits will improve  I certify that inpatient services furnished can reasonably be expected to improve the patient's condition.    Mordecai Rasmussen, MD 5/8/20241:39 PM

## 2022-10-08 NOTE — ED Notes (Signed)
Pt's belongings handed to police.

## 2022-10-08 NOTE — Progress Notes (Signed)
Admission note:  Consents signed, literature detailing the patient's rights, responsibilities, and visitor guidelines provided. He denies SI/HI/AVH. Endorses anxiety and depression. States his trigger to overtake/attempt overdose on his medication is because it was the anniversary of "my mothers death". He states his medications were not working for his anxiety and "panic attacks". Noted tic/hiccup sound made with his voice and states it happens because "I can't breathe"- O2 sat= 99%. Skin search WDL.Belongings searched and no contraband found. He was then oriented to the unit and his room. No questions or concerns at present. He contracts for safety and is safe on the unit at this time.

## 2022-10-08 NOTE — ED Notes (Signed)
Sheriff contacted for transport 

## 2022-10-08 NOTE — ED Provider Notes (Signed)
Emergency Medicine Observation Re-evaluation Note  Raymond [name] is a 42 y.o. male, seen on rounds today.  Pt initially presented to the ED for complaints of Psychiatric Evaluation Pt reports feeling depressed w SI. No new c/o this AM, calm, no distress.  Physical Exam  BP (!) 133/90   Pulse 67   Temp 98.1 F (36.7 C) (Oral)   Resp 18   SpO2 97%  Physical Exam General: alert, content.  Cardiac: regular rate.  Lungs: breathing comfortably. Psych: normal mood/affect  ED Course / MDM   I have reviewed the labs performed to date as well as medications administered while in observation.  Recent changes in the last 24 hours include ED obs, reassessment.   Plan  BH notes indicate pt accepted to [facility] unit.    Pt currently appears stable for transport.    Cathren Laine, MD 10/08/22 [phone]

## 2022-10-08 NOTE — Group Note (Signed)
Recreation Therapy Group Note   Group Topic:Self-Esteem  Group Date: 10/08/2022 Start Time: 1000 End Time: 1050 Facilitators: Rosina Lowenstein, LRT, CTRS Location:  Craft Room  Group Description: Patients and LRT discussed the importance of self-love and self-esteem. Pt completed a worksheet that helps them identify 24 different strengths and qualities about themselves. Pt encouraged to read aloud at least 3 off their sheet to the group. LRT and pts discussed how this can be applied to daily life post-discharge.  Pt's then played "Positive Affirmation Bingo" afterwards, with journals, activity books or stress balls as bingo prizes.  Goal Area(s) Addressed: Patient will identify positive qualities about themselves. Patient will learn new positive affirmations.  Patient will recite positive qualities and affirmations aloud to the group.   Affect/Mood: N/A   Participation Level: Did not attend    Clinical Observations/Individualized Feedback: Raymond [name] did not attend group due to not being on the unit yet.   Plan: Continue to engage patient in RT group sessions 2-3x/week.   Rosina Lowenstein, LRT, CTRS 10/08/2022 11:22 AM

## 2022-10-08 NOTE — Group Note (Signed)
[facility] LCSW Group Therapy Note   Group Date: 10/08/2022 Start Time: 1320 End Time: 1350   Type of Therapy/Topic:  Group Therapy:  Emotion Regulation  Participation Level:  Active    Description of Group:    The purpose of this group is to assist patients in learning to regulate negative emotions and experience positive emotions. Patients will be guided to discuss ways in which they have been vulnerable to their negative emotions. These vulnerabilities will be juxtaposed with experiences of positive emotions or situations, and patients challenged to use positive emotions to combat negative ones. Special emphasis will be placed on coping with negative emotions in conflict situations, and patients will process healthy conflict resolution skills.  Therapeutic Goals: Patient will identify two positive emotions or experiences to reflect on in order to balance out negative emotions:  Patient will label two or more emotions that they find the most difficult to experience:  Patient will be able to demonstrate positive conflict resolution skills through discussion or role plays:   Summary of Patient Progress: Patient was present for the majority of the group although he arrived late. He shared that he was angry after the death of his mother and two babies. Pt stated that he was angry at God. He endorsed experiencing anxiety and depression, noting that he typically knows that he is when he begins to become isolative and not the happy go lucky person that he typically is. Pt shared that prayer, talking to God, and spending time in nature helps him to deal with his strong negative emotions. Pt was appropriate during the discussion. He appeared open and receptive to feedback/comments from both peers and facilitator.    Therapeutic Modalities:   Cognitive Behavioral Therapy Feelings Identification Dialectical Behavioral Therapy   Glenis Smoker, LCSW

## 2022-10-08 NOTE — BHH Suicide Risk Assessment (Signed)
[facility] Admission Suicide Risk Assessment   Nursing information obtained from:  Patient Demographic factors:  Male, Low socioeconomic status, Living alone Current Mental Status:  NA Loss Factors:  Financial problems / change in socioeconomic status Historical Factors:  Prior suicide attempts Risk Reduction Factors:  NA  Total Time spent with patient: 45 minutes Principal Problem: Traumatic brain injury (HCC) Diagnosis:  Principal Problem:   Traumatic brain injury (HCC) Active Problems:   Seizure disorder (HCC)   Substance induced mood disorder (HCC)  Subjective Data: Patient seen and chart reviewed.  42 year old man with a history of traumatic brain injury presented to the emergency room in [place] claiming that he had overdosed on Keppra.  Patient was sent here based on that statement.  On interview today the patient says that "I have tried to do away with myself 4 times in the last week".  Endorses having overdosed but does not state the same amount of pills or exactly the same pills that were stated in [place].  Says that currently he has no wish to die and no thought of killing himself.  Continued Clinical Symptoms:    The "Alcohol Use Disorders Identification Test", Guidelines for Use in Primary Care, Second Edition.  World Science writer [facility]). Score between 0-7:  no or low risk or alcohol related problems. Score between 8-15:  moderate risk of alcohol related problems. Score between 16-19:  high risk of alcohol related problems. Score 20 or above:  warrants further diagnostic evaluation for alcohol dependence and treatment.   CLINICAL FACTORS:   Medical Diagnoses and Treatments/Surgeries   Musculoskeletal: Strength & Muscle Tone: within normal limits Gait & Station: normal Patient leans: N/A  Psychiatric Specialty Exam:  Presentation  General Appearance:  Appropriate for Environment  Eye Contact: Fair  Speech: Clear and Coherent  Speech  Volume: Normal  Handedness:No data recorded  Mood and Affect  Mood: Euthymic  Affect: Congruent   Thought Process  Thought Processes: Coherent  Descriptions of Associations:Intact  Orientation:Full (Time, Place and Person)  Thought Content:WDL  History of Schizophrenia/Schizoaffective disorder:No  Duration of Psychotic Symptoms:No data recorded Hallucinations:No data recorded Ideas of Reference:None  Suicidal Thoughts:No data recorded Homicidal Thoughts:No data recorded  Sensorium  Memory: Immediate Fair; Recent Fair  Judgment: Fair  Insight: Fair   Art therapist  Concentration: Good  Attention Span: Good  Recall: Good  Fund of Knowledge: Good  Language: Good   Psychomotor Activity  Psychomotor Activity:No data recorded  Assets  Assets: Desire for Improvement; Leisure Time; Physical Health; Resilience   Sleep  Sleep:No data recorded   Physical Exam: Physical Exam Vitals and nursing note reviewed.  Constitutional:      Appearance: Normal appearance.  HENT:     Head: Normocephalic and atraumatic.     Mouth/Throat:     Pharynx: Oropharynx is clear.  Eyes:     Pupils: Pupils are equal, round, and reactive to light.  Cardiovascular:     Rate and Rhythm: Normal rate and regular rhythm.  Pulmonary:     Effort: Pulmonary effort is normal.     Breath sounds: Normal breath sounds.  Abdominal:     General: Abdomen is flat.     Palpations: Abdomen is soft.  Musculoskeletal:        General: Normal range of motion.  Skin:    General: Skin is warm and dry.  Neurological:     General: No focal deficit present.     Mental Status: He is alert. Mental status is at  baseline.  Psychiatric:        Attention and Perception: He is inattentive.        Mood and Affect: Affect is labile and inappropriate.        Speech: Speech is tangential.        Behavior: Behavior is agitated. Behavior is not aggressive.        Thought Content:  Thought content normal. Thought content does not include homicidal or suicidal ideation.        Cognition and Memory: Cognition is impaired. Memory is impaired.        Judgment: Judgment is inappropriate.    Review of Systems  Constitutional: Negative.   HENT: Negative.    Eyes: Negative.   Respiratory: Negative.    Cardiovascular: Negative.   Gastrointestinal: Negative.   Musculoskeletal: Negative.   Skin: Negative.   Neurological: Negative.   Psychiatric/Behavioral:  Positive for memory loss. Negative for depression, hallucinations, substance abuse and suicidal ideas. The patient is not nervous/anxious and does not have insomnia.    Blood pressure (!) 164/112, pulse 84, temperature (!) 97 F (36.1 C), temperature source Oral, resp. rate 18, height 6\' 1"  (1.854 m), weight 74.8 kg, SpO2 99 %. Body mass index is 21.77 kg/m.   COGNITIVE FEATURES THAT CONTRIBUTE TO RISK:  Loss of executive function    SUICIDE RISK:   Minimal: No identifiable suicidal ideation.  Patients presenting with no risk factors but with morbid ruminations; may be classified as minimal risk based on the severity of the depressive symptoms  PLAN OF CARE: 15-minute checks.  Restart seizure medicine.  Engage in individual and group therapy and assessment on the unit.  Ongoing assessment of dangerousness prior to discharge  I certify that inpatient services furnished can reasonably be expected to improve the patient's condition.   Mordecai Rasmussen, MD 10/08/2022, 1:21 PM

## 2022-10-09 DIAGNOSIS — S069XAA Unspecified intracranial injury with loss of consciousness status unknown, initial encounter: Secondary | ICD-10-CM | POA: Diagnosis not present

## 2022-10-09 LAB — LEVETIRACETAM LEVEL: Levetiracetam Lvl: <2 ug/mL — ABNORMAL LOW (ref 10.0–40.0)

## 2022-10-09 LAB — ACETAMINOPHEN LEVEL: Acetaminophen (Tylenol), Serum: <10 ug/mL — ABNORMAL LOW (ref 10–30)

## 2022-10-09 MED ORDER — RISPERIDONE 1 MG PO TABS
1.0000 mg | ORAL_TABLET | Freq: Two times a day (BID) | ORAL | Status: DC
  Administered 2022-10-09 – 2022-10-11 (×4): 1 mg via ORAL
  Filled 2022-10-09 (×4): qty 1

## 2022-10-09 MED ORDER — FLUOXETINE HCL 20 MG PO CAPS
20.0000 mg | ORAL_CAPSULE | Freq: Every day | ORAL | Status: DC
  Administered 2022-10-10 – 2022-10-16 (×7): 20 mg via ORAL
  Filled 2022-10-09 (×7): qty 1

## 2022-10-09 NOTE — Group Note (Signed)
LCSW Group Therapy Note  Group Date: 10/09/2022 Start Time: 1300 End Time: 1400   Type of Therapy and Topic:  Group Therapy - Healthy vs Unhealthy Coping Skills  Participation Level:  Active   Description of Group The focus of this group was to determine what unhealthy coping techniques typically are used by group members and what healthy coping techniques would be helpful in coping with various problems. Patients were guided in becoming aware of the differences between healthy and unhealthy coping techniques. Patients were asked to identify 2-3 healthy coping skills they would like to learn to use more effectively.  Therapeutic Goals Patients learned that coping is what human beings do all day long to deal with various situations in their lives Patients defined and discussed healthy vs unhealthy coping techniques Patients identified their preferred coping techniques and identified whether these were healthy or unhealthy Patients determined 2-3 healthy coping skills they would like to become more familiar with and use more often. Patients provided support and ideas to each other   Summary of Patient Progress:  Patient was present for the entirety of the group session. Patient was an active listener and participated in the topic of discussion, provided helpful advice to others, and added nuance to topic of conversation. Patient shared he enjoys going to the park and watching movies in order to cope with difficult situations.   Therapeutic Modalities Cognitive Behavioral Therapy Motivational Interviewing  Almedia Balls 10/09/2022  3:19 PM

## 2022-10-09 NOTE — Progress Notes (Signed)
[facility] MD Progress Note  10/09/2022 11:37 AM Raymond [name]  MRN:  [id number] Subjective: Follow-up with this 42 year old male with a history of traumatic brain injury and seizure disorder.  Patient tells me that he "almost" had a seizure earlier today.  He seems to occasionally get a sensation that he equates with seizures but there was nothing abnormal reported in his behavior.  He says he still feels anxious.  He feels worried about going back home because he knows he has not been taking care of himself well and remains at high risk. Principal Problem: Traumatic brain injury [facility]) Diagnosis: Principal Problem:   Traumatic brain injury [facility]) Active Problems:   Seizure disorder (HCC)   Substance induced mood disorder (HCC)  Total Time spent with patient: 30 minutes  Past Psychiatric History: Past history reportedly of traumatic brain injury other details unclear.  1 previous hospitalization here.  This apparently has an extensive history in [place]  Past Medical History:  Past Medical History:  Diagnosis Date   Anxiety    Hepatitis C    PTSD (post-traumatic stress disorder)     Past Surgical History:  Procedure Laterality Date   FACIAL FRACTURE SURGERY     metal plate under right eye   Family History: History reviewed. No pertinent family history. Family Psychiatric  History: See previous Social History:  Social History   Substance and Sexual Activity  Alcohol Use Not Currently     Social History   Substance and Sexual Activity  Drug Use Not Currently    Social History   Socioeconomic History   Marital status: Single    Spouse name: Not on file   Number of children: Not on file   Years of education: Not on file   Highest education level: Not on file  Occupational History   Not on file  Tobacco Use   Smoking status: Some Days    Types: Cigarettes   Smokeless tobacco: Never  Vaping Use   Vaping Use: Never used  Substance and Sexual Activity   Alcohol use: Not  Currently   Drug use: Not Currently   Sexual activity: Not on file  Other Topics Concern   Not on file  Social History Narrative   Not on file   Social Determinants of Health   Financial Resource Strain: Not on file  Food Insecurity: Food Insecurity Present (10/08/2022)   Hunger Vital Sign    Worried About Running Out of Food in the Last Year: Sometimes true    Ran Out of Food in the Last Year: Sometimes true  Transportation Needs: Patient Declined (10/08/2022)   PRAPARE - Administrator, Civil Service (Medical): Patient declined    Lack of Transportation (Non-Medical): Patient declined  Physical Activity: Not on file  Stress: Not on file  Social Connections: Not on file   Additional Social History:                         Sleep: Fair  Appetite:  Fair  Current Medications: Current Facility-Administered Medications  Medication Dose Route Frequency Provider Last Rate Last Admin   acetaminophen (TYLENOL) tablet 650 mg  650 mg Oral Q6H PRN Oneta Rack, NP       albuterol (VENTOLIN HFA) 108 (90 Base) MCG/ACT inhaler 2 puff  2 puff Inhalation Q4H PRN Oneta Rack, NP       alum & mag hydroxide-simeth (MAALOX/MYLANTA) 200-200-20 MG/5ML suspension 30 mL  30 mL Oral  Q4H PRN Oneta Rack, NP       diphenhydrAMINE (BENADRYL) capsule 50 mg  50 mg Oral TID PRN Oneta Rack, NP       Or   diphenhydrAMINE (BENADRYL) injection 50 mg  50 mg Intramuscular TID PRN Oneta Rack, NP       [START ON 10/10/2022] FLUoxetine (PROZAC) capsule 20 mg  20 mg Oral Daily Tassie Pollett, Jackquline Denmark, MD       levETIRAcetam (KEPPRA) tablet 500 mg  500 mg Oral BID Oneta Rack, NP   500 mg at 10/09/22 0801   magnesium hydroxide (MILK OF MAGNESIA) suspension 30 mL  30 mL Oral Daily PRN Oneta Rack, NP       risperiDONE (RISPERDAL) tablet 1 mg  1 mg Oral BID Zephaniah Enyeart, Jackquline Denmark, MD       traZODone (DESYREL) tablet 50 mg  50 mg Oral QHS PRN Oneta Rack, NP        Lab Results:   Results for orders placed or performed during the hospital encounter of 10/08/22 (from the past 48 hour(s))  Acetaminophen level     Status: Abnormal   Collection Time: 10/09/22  8:44 AM  Result Value Ref Range   Acetaminophen (Tylenol), Serum <10 (L) 10 - 30 ug/mL    Comment: (NOTE) Therapeutic concentrations vary significantly. A range of 10-30 ug/mL  may be an effective concentration for many patients. However, some  are best treated at concentrations outside of this range. Acetaminophen concentrations >150 ug/mL at 4 hours after ingestion  and >50 ug/mL at 12 hours after ingestion are often associated with  toxic reactions.  Performed at [facility] ([place] [address] [place], [place] [address]     Blood Alcohol level:  Lab Results  Component Value Date   ETH <10 10/07/2022   ETH 47 (H) 09/27/2022    Metabolic Disorder Labs: No results found for: "HGBA1C", "MPG" No results found for: "PROLACTIN" No results found for: "CHOL", "TRIG", "HDL", "CHOLHDL", "VLDL", "LDLCALC"  Physical Findings: AIMS:  , ,  ,  ,    CIWA:    COWS:     Musculoskeletal: Strength & Muscle Tone: within normal limits Gait & Station: normal Patient leans: N/A  Psychiatric Specialty Exam:  Presentation  General Appearance:  Appropriate for Environment  Eye Contact: Fair  Speech: Clear and Coherent  Speech Volume: Normal  Handedness:No data recorded  Mood and Affect  Mood: Euthymic  Affect: Congruent   Thought Process  Thought Processes: Coherent  Descriptions of Associations:Intact  Orientation:Full (Time, Place and Person)  Thought Content:WDL  History of Schizophrenia/Schizoaffective disorder:No  Duration of Psychotic Symptoms:No data recorded Hallucinations:No data recorded Ideas of Reference:None  Suicidal Thoughts:No data recorded Homicidal Thoughts:No data recorded  Sensorium  Memory: Immediate Fair; Recent  Fair  Judgment: Fair  Insight: Fair   Art therapist  Concentration: Good  Attention Span: Good  Recall: Good  Fund of Knowledge: Good  Language: Good   Psychomotor Activity  Psychomotor Activity:No data recorded  Assets  Assets: Desire for Improvement; Leisure Time; Physical Health; Resilience   Sleep  Sleep:No data recorded   Physical Exam: Physical Exam Vitals and nursing note reviewed.  Constitutional:      Appearance: Normal appearance.  HENT:     Head: Normocephalic and atraumatic.     Mouth/Throat:     Pharynx: Oropharynx is clear.  Eyes:     Pupils: Pupils are equal, round, and reactive to light.  Cardiovascular:  Rate and Rhythm: Normal rate and regular rhythm.  Pulmonary:     Effort: Pulmonary effort is normal.     Breath sounds: Normal breath sounds.  Abdominal:     General: Abdomen is flat.     Palpations: Abdomen is soft.  Musculoskeletal:        General: Normal range of motion.  Skin:    General: Skin is warm and dry.  Neurological:     General: No focal deficit present.     Mental Status: He is alert. Mental status is at baseline.  Psychiatric:        Attention and Perception: Attention normal.        Mood and Affect: Mood is anxious. Affect is blunt.        Speech: Speech normal.        Behavior: Behavior is cooperative.        Thought Content: Thought content normal.        Cognition and Memory: Cognition is impaired.    Review of Systems  Constitutional: Negative.   HENT: Negative.    Eyes: Negative.   Respiratory: Negative.    Cardiovascular: Negative.   Gastrointestinal: Negative.   Musculoskeletal: Negative.   Skin: Negative.   Neurological: Negative.   Psychiatric/Behavioral:  Positive for depression. Negative for hallucinations, substance abuse and suicidal ideas. The patient is nervous/anxious.    Blood pressure 113/67, pulse 79, temperature 98 F (36.7 C), temperature source Oral, resp. rate 16,  height 6\' 1"  (1.854 m), weight 74.8 kg, SpO2 99 %. Body mass index is 21.77 kg/m.   Treatment Plan Summary: Medication management and Plan patient participates in groups and interacts with others on the unit without any difficulty.  Most of the time he seems to be able to sit quietly.  Whenever I start talking to him within a minute or so he will start making the odd hiccuping sound which seems to be related to anxiety.  Difficult situation as he seems to be returning near to his baseline but even at his baseline there seems to be evidence that he is not doing a very good job living independently or able to take care of his medicine safely.  Probably would do much better in some kind of supervised environment but of course that is not available at the moment.  Keppra level was ordered but has not come back from the lab yet.  Tylenol level was 0 which is what I expected.  Increased dose of Prozac and risk Bedol today for symptoms of anxiety.  Encourage his interaction with others on the unit.  Mordecai Rasmussen, MD 10/09/2022, 11:37 AM

## 2022-10-09 NOTE — Progress Notes (Signed)
Patient calm and pleasant during assessment denying SI/HI/AVH. Pt observed interacting appropriately with staff and peers on the unit. Pt didn't have any medications scheduled tonight and hasn't requested anything PRN. Pt given education, support, and encouragement to be active in his treatment plan. Pt being monitored Q 15 minutes for safety per unit protocol, remains safe on the unit  

## 2022-10-09 NOTE — Progress Notes (Signed)
D - The patient watched television and socialized with peers and staff.  He appeared restless and energetic.  Raymond [name]'s mood was bright and affect mildly anxious.  Speech was circumstantial.  The patient consumed an ample snack and fluids.  The patient denied thoughts of harming himself and others.  No unsafe behaviors observed.  No symptoms of withdrawal from alleged overdose observed or reported.    A - Mental status and safety checks.    R - The patient acclimated to the unit and dell asleep before 2230.  No significant issues to note.  No symptoms of panic observed.

## 2022-10-09 NOTE — Progress Notes (Signed)
D- Patient alert and oriented x 4. Affect blunted/mood anxious. Denies SI/ HI/ AVH. Patient denies pain. Patient endorses depression and anxiety. States his goal today is to "relax". A- Scheduled medications administered to patient, per MD orders. Support and encouragement provided.  Routine safety checks conducted every 15 minutes without incident.  Patient informed to notify staff with problems or concerns and verbalizes understanding. R- No adverse drug reactions noted.  Patient compliant with medications and treatment plan. Patient receptive, calm, cooperative and interacts well with others on the unit.  Patient contracts for safety and  remains safe on the unit at this time.

## 2022-10-09 NOTE — Group Note (Signed)
Recreation Therapy Group Note   Group Topic:Problem Solving  Group Date: 10/09/2022 Start Time: 1000 End Time: 1055 Facilitators: Rosina Lowenstein, LRT, CTRS Location:  Craft Room  Group Description: Life Boat. Patients were given the scenario that they are on a boat that is about to become shipwrecked, leaving them stranded on an [place]. They are asked to make a list of 15 different items that they want to take with them when they are stranded on the [place]. Patients are asked to rank their items from most important to least important, #1 being the most important and #15 being the least. Patients will work individually for the first round to come up with 15 items and then pair up with a peer(s) to condense their list and come up with one list of 15 items between the two of them. Patients or LRT will read aloud the 15 different items to the group after each round. LRT facilitated post-activity processing to discuss how this activity can be used in daily life post discharge.   Goal Area(s) Addressed:  Patient will identify priorities, wants and needs. Patient will communicate with LRT and peers. Patient will work collectively as a Administrator, Civil Service. Patient will work on Product manager.   Affect/Mood: Appropriate   Participation Level: Active and Engaged   Participation Quality: Independent   Behavior: Cooperative and Medical illustrator Process: Coherent   Insight: Good   Judgement: Good   Modes of Intervention: Activity   Patient Response to Interventions:  Attentive, Engaged, Interested , and Receptive   Education Outcome:  Acknowledges education   Clinical Observations/Individualized Feedback: Lavelle  was active in their participation of session activities and group discussion. Pt identified "shelter, fire, food, puppy, Paediatric nurse, and wife" as some of the items he wants to bring with him on [address] Pt spontaneously contributed to group discussion on more than one occasion.  Pt also made the comment more than once that he "loves it here!" Referring to the unit. Pt interacted well with LRT and peers duration of session.    Plan: Continue to engage patient in RT group sessions 2-3x/week.   Rosina Lowenstein, LRT, CTRS 10/09/2022 11:05 AM
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
## 2022-10-09 NOTE — Plan of Care (Signed)

## 2022-10-09 NOTE — Group Note (Signed)
Date:  10/09/2022 Time:  5:28 PM  Group Topic/Focus:  Music therapy, outside courtyard     Participation Level:  Active  Participation Quality:  Appropriate  Affect:  Appropriate  Cognitive:  Alert and Appropriate  Insight: Appropriate  Engagement in Group:  Engaged  Modes of Intervention:  Activity  Additional Comments:    Doug Sou 10/09/2022, 5:28 PM

## 2022-10-10 DIAGNOSIS — S069XAA Unspecified intracranial injury with loss of consciousness status unknown, initial encounter: Secondary | ICD-10-CM | POA: Diagnosis not present

## 2022-10-10 LAB — LEVETIRACETAM LEVEL: Levetiracetam Lvl: 16.9 ug/mL (ref 10.0–40.0)

## 2022-10-10 MED ORDER — SENNOSIDES-DOCUSATE SODIUM 8.6-50 MG PO TABS: 2.0000 | ORAL_TABLET | Freq: Every day | ORAL | Status: DC | PRN

## 2022-10-10 MED ORDER — DIVALPROEX SODIUM 250 MG PO DR TAB
250.0000 mg | DELAYED_RELEASE_TABLET | Freq: Two times a day (BID) | ORAL | Status: DC
  Administered 2022-10-10 – 2022-10-11 (×2): 250 mg via ORAL
  Filled 2022-10-10 (×2): qty 1
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
## 2022-10-10 NOTE — BH IP Treatment Plan (Signed)
Interdisciplinary Treatment and Diagnostic Plan Update  10/10/2022 Time of Session: 09:10 Raymond [name] MRN: [id number]  Principal Diagnosis: Traumatic brain injury [name])  Secondary Diagnoses: Principal Problem:   Traumatic brain injury [facility]) Active Problems:   Seizure disorder (HCC)   Substance induced mood disorder (HCC)   Current Medications:  Current Facility-Administered Medications  Medication Dose Route Frequency Provider Last Rate Last Admin   acetaminophen (TYLENOL) tablet 650 mg  650 mg Oral Q6H PRN Oneta Rack, NP       albuterol (VENTOLIN HFA) 108 (90 Base) MCG/ACT inhaler 2 puff  2 puff Inhalation Q4H PRN Oneta Rack, NP       alum & mag hydroxide-simeth (MAALOX/MYLANTA) 200-200-20 MG/5ML suspension 30 mL  30 mL Oral Q4H PRN Oneta Rack, NP       diphenhydrAMINE (BENADRYL) capsule 50 mg  50 mg Oral TID PRN Oneta Rack, NP       Or   diphenhydrAMINE (BENADRYL) injection 50 mg  50 mg Intramuscular TID PRN Oneta Rack, NP       FLUoxetine (PROZAC) capsule 20 mg  20 mg Oral Daily Clapacs, Jackquline Denmark, MD   20 mg at 10/10/22 0829   levETIRAcetam (KEPPRA) tablet 500 mg  500 mg Oral BID Oneta Rack, NP   500 mg at 10/10/22 4696   magnesium hydroxide (MILK OF MAGNESIA) suspension 30 mL  30 mL Oral Daily PRN Oneta Rack, NP       risperiDONE (RISPERDAL) tablet 1 mg  1 mg Oral BID Clapacs, Jackquline Denmark, MD   1 mg at 10/10/22 2952   traZODone (DESYREL) tablet 50 mg  50 mg Oral QHS PRN Oneta Rack, NP       PTA Medications: Medications Prior to Admission  Medication Sig Dispense Refill Last Dose   albuterol (VENTOLIN HFA) 108 (90 Base) MCG/ACT inhaler Inhale 2 puffs into the lungs every 4 (four) hours as needed for wheezing or shortness of breath. 6.7 g 0    FLUoxetine (PROZAC) 10 MG capsule Take 1 capsule (10 mg total) by mouth daily. 30 capsule 0    levETIRAcetam (KEPPRA) 500 MG tablet Take 1 tablet (500 mg total) by mouth 2 (two) times daily. 60 tablet 0     risperiDONE (RISPERDAL) 0.5 MG tablet Take 1 tablet (0.5 mg total) by mouth 2 (two) times daily. 60 tablet 0    traZODone (DESYREL) 50 MG tablet Take 1 tablet (50 mg total) by mouth at bedtime as needed for sleep. 30 tablet 0     Patient Stressors: Financial difficulties   Health problems   Medication change or noncompliance   Substance abuse   Traumatic event    Patient Strengths: Ability for insight  Capable of independent living  Communication skills  General fund of knowledge   Treatment Modalities: Medication Management, Group therapy, Case management,  1 to 1 session with clinician, Psychoeducation, Recreational therapy.   Physician Treatment Plan for Primary Diagnosis: Traumatic brain injury [facility]) Long Term Goal(s): Improvement in symptoms so as ready for discharge   Short Term Goals: Ability to maintain clinical measurements within normal limits will improve Ability to disclose and discuss suicidal ideas Ability to demonstrate self-control will improve  Medication Management: Evaluate patient's response, side effects, and tolerance of medication regimen.  Therapeutic Interventions: 1 to 1 sessions, Unit Group sessions and Medication administration.  Evaluation of Outcomes: Progressing  Physician Treatment Plan for Secondary Diagnosis: Principal Problem:   Traumatic brain injury (HCC)  Active Problems:   Seizure disorder (HCC)   Substance induced mood disorder (HCC)  Long Term Goal(s): Improvement in symptoms so as ready for discharge   Short Term Goals: Ability to maintain clinical measurements within normal limits will improve Ability to disclose and discuss suicidal ideas Ability to demonstrate self-control will improve     Medication Management: Evaluate patient's response, side effects, and tolerance of medication regimen.  Therapeutic Interventions: 1 to 1 sessions, Unit Group sessions and Medication administration.  Evaluation of Outcomes:  Progressing   RN Treatment Plan for Primary Diagnosis: Traumatic brain injury (HCC) Long Term Goal(s): Knowledge of disease and therapeutic regimen to maintain health will improve  Short Term Goals: Ability to remain free from injury will improve, Ability to verbalize frustration and anger appropriately will improve, Ability to demonstrate self-control, Ability to participate in decision making will improve, Ability to verbalize feelings will improve, Ability to disclose and discuss suicidal ideas, Ability to identify and develop effective coping behaviors will improve, and Compliance with prescribed medications will improve  Medication Management: RN will administer medications as ordered by provider, will assess and evaluate patient's response and provide education to patient for prescribed medication. RN will report any adverse and/or side effects to prescribing provider.  Therapeutic Interventions: 1 on 1 counseling sessions, Psychoeducation, Medication administration, Evaluate responses to treatment, Monitor vital signs and CBGs as ordered, Perform/monitor CIWA, COWS, AIMS and Fall Risk screenings as ordered, Perform wound care treatments as ordered.  Evaluation of Outcomes: Progressing   LCSW Treatment Plan for Primary Diagnosis: Traumatic brain injury [facility]) Long Term Goal(s): Safe transition to appropriate next level of care at discharge, Engage patient in therapeutic group addressing interpersonal concerns.  Short Term Goals: Engage patient in aftercare planning with referrals and resources, Increase social support, Increase ability to appropriately verbalize feelings, Increase emotional regulation, Facilitate acceptance of mental health diagnosis and concerns, Facilitate patient progression through stages of change regarding substance use diagnoses and concerns, Identify triggers associated with mental health/substance abuse issues, and Increase skills for wellness and  recovery  Therapeutic Interventions: Assess for all discharge needs, 1 to 1 time with Social worker, Explore available resources and support systems, Assess for adequacy in community support network, Educate family and significant other(s) on suicide prevention, Complete Psychosocial Assessment, Interpersonal group therapy.  Evaluation of Outcomes: Progressing   Progress in Treatment: Attending groups: Yes. Participating in groups: Yes. Taking medication as prescribed: Yes. Toleration medication: Yes. Family/Significant other contact made: No, will contact:  if given consent. Patient understands diagnosis: Yes. Discussing patient identified problems/goals with staff: Yes. Medical problems stabilized or resolved: Yes. Denies suicidal/homicidal ideation: No. Issues/concerns per patient self-inventory: No. Other: none.  New problem(s) identified: No, Describe:  none identified.   New Short Term/Long Term Goal(s): medication management for mood stabilization; elimination of SI thoughts; development of comprehensive mental wellness/sobriety plan.   Patient Goals:  "I just want to get rid of my panic attacks, my seizures to get better, and my thoughts of hurting myself have gotten worse."   Discharge Plan or Barriers: CSW will assist pt with development of an appropriate aftercare/discharge plan.   Reason for Continuation of Hospitalization: Anxiety Depression Medication stabilization Suicidal ideation  Estimated Length of Stay: 1-7 days  Last 3 [place] Suicide Severity Risk Score: Flowsheet Row Admission (Current) from 10/08/2022 in [facility] INPATIENT BEHAVIORAL MEDICINE ED from 10/07/2022 in [facility] Emergency Department at [facility] ED from 09/27/2022 in [facility] Emergency Department at [facility]  C-SSRS  RISK CATEGORY High Risk High Risk High Risk       Last PHQ 2/9 Scores:     No data to display          Scribe for Treatment Team: Glenis Smoker,  LCSW 10/10/2022 9:41 AM

## 2022-10-10 NOTE — Group Note (Signed)
Recreation Therapy Group Note   Group Topic:Leisure Education  Group Date: 10/10/2022 Start Time: 1000 End Time: 1110 Facilitators: Rosina Lowenstein, LRT, CTRS Location:  Craft Room  Group Description: Leisure. Patients were given the option to choose from singing karaoke, making origami, using oil pastels, or playing UNO. LRT and pts discussed the meaning of leisure, the importance of participating in leisure during their free time/when they're outside of the hospital, as well as how our leisure interests can also serve as coping skills. Pt identified two leisure interests and shared with the group.   Goal Area(s) Addressed:  Patient will identify a current leisure interest.  Patient will learn the definition of "leisure". Patient will practice making a positive decision. Patient will have the opportunity to try a new leisure activity. Patient will communicate with peers and LRT.   Affect/Mood: Appropriate   Participation Level: Active and Engaged   Participation Quality: Minimal Cues   Behavior: Eager   Speech/Thought Process: Coherent   Insight: Good   Judgement: Fair    Modes of Intervention: Activity   Patient Response to Interventions:  Attentive, Engaged, Interested , and Receptive   Education Outcome:  Acknowledges education   Clinical Observations/Individualized Feedback: Raymond [name] was active in their participation of session activities and group discussion. Pt identified "sing and cook" as things that he does in his free time. Pt chose to sing karaoke both independently and with a peers while in group. Pt required minimal cues to be less intrusive and was receptive.    Plan: Continue to engage patient in RT group sessions 2-3x/week.   Rosina Lowenstein, LRT, CTRS 10/10/2022 11:29 AM

## 2022-10-10 NOTE — Plan of Care (Signed)
D- Patient alert and oriented. Patient presented in an anxious, preoccupied mood on assessment reporting that he slept good last night, but stated to this writer that it was hard to sleep because of nausea/vomiting. Patient endorsed hopelessness, depression, anxiety, and passive suicidal thoughts on his self-inventory. Patient stated "thinking about my Mom, she passed away two years ago, I've lost two babies, I'm alone with no family here. What's the point of me being alive if I'm going through all this pain". Patient denied HI/AVH, and pain at this time. Patient's stated goal for today, per his self-inventory, is to "better myself", in which he will "focus on me", in order to achieve his goal. Per treatment team meeting, patient's goal for treatment is that he wants his panic attacks to stop, as well as getting his seizures under control. Patient also stated to staff that his suicidal thoughts have gotten worse.   A- Scheduled medications administered to patient, per MD orders. Support and encouragement provided.  Routine safety checks conducted every 15 minutes.  Patient informed to notify staff with problems or concerns.  R- No adverse drug reactions noted. Patient contracts for safety at this time. Patient compliant with medications and treatment plan. Patient receptive, calm, and cooperative. Patient interacts well with others on the unit.  Patient remains safe at this time.  Problem: Education: Goal: Knowledge of General Education information will improve Description: Including pain rating scale, medication(s)/side effects and non-pharmacologic comfort measures Outcome: Not Progressing   Problem: Health Behavior/Discharge Planning: Goal: Ability to manage health-related needs will improve Outcome: Not Progressing   Problem: Clinical Measurements: Goal: Ability to maintain clinical measurements within normal limits will improve Outcome: Not Progressing Goal: Will remain free from  infection Outcome: Not Progressing Goal: Diagnostic test results will improve Outcome: Not Progressing Goal: Respiratory complications will improve Outcome: Not Progressing Goal: Cardiovascular complication will be avoided Outcome: Not Progressing   Problem: Activity: Goal: Risk for activity intolerance will decrease Outcome: Not Progressing   Problem: Nutrition: Goal: Adequate nutrition will be maintained Outcome: Not Progressing   Problem: Coping: Goal: Level of anxiety will decrease Outcome: Not Progressing   Problem: Elimination: Goal: Will not experience complications related to bowel motility Outcome: Not Progressing Goal: Will not experience complications related to urinary retention Outcome: Not Progressing   Problem: Pain Managment: Goal: General experience of comfort will improve Outcome: Not Progressing   Problem: Safety: Goal: Ability to remain free from injury will improve Outcome: Not Progressing   Problem: Skin Integrity: Goal: Risk for impaired skin integrity will decrease Outcome: Not Progressing   Problem: Education: Goal: Knowledge of [place] General Education information/materials will improve Outcome: Not Progressing Goal: Emotional status will improve Outcome: Not Progressing Goal: Mental status will improve Outcome: Not Progressing Goal: Verbalization of understanding the information provided will improve Outcome: Not Progressing   Problem: Coping: Goal: Coping ability will improve Outcome: Not Progressing Goal: Will verbalize feelings Outcome: Not Progressing

## 2022-10-11 DIAGNOSIS — S069XAA Unspecified intracranial injury with loss of consciousness status unknown, initial encounter: Secondary | ICD-10-CM | POA: Diagnosis not present

## 2022-10-11 MED ORDER — DIVALPROEX SODIUM 500 MG PO DR TAB
500.0000 mg | DELAYED_RELEASE_TABLET | Freq: Two times a day (BID) | ORAL | Status: DC
  Administered 2022-10-11 – 2022-10-12 (×2): 500 mg via ORAL
  Filled 2022-10-11 (×2): qty 1

## 2022-10-11 MED ORDER — RISPERIDONE 1 MG PO TABS
2.0000 mg | ORAL_TABLET | Freq: Every day | ORAL | Status: DC
  Administered 2022-10-11 – 2022-10-13 (×3): 2 mg via ORAL
  Filled 2022-10-11 (×3): qty 2

## 2022-10-11 MED ORDER — RISPERIDONE 1 MG PO TABS
1.0000 mg | ORAL_TABLET | Freq: Every day | ORAL | Status: DC
  Administered 2022-10-12 – 2022-10-16 (×5): 1 mg via ORAL
  Filled 2022-10-11 (×6): qty 1

## 2022-10-11 NOTE — Group Note (Signed)
Date:  10/11/2022 Time:  5:37 PM  Group Topic/Focus:  Outdoor Recreation/Activity    Participation Level:  Active  Participation Quality:  Appropriate  Affect:  Appropriate  Cognitive:  Appropriate  Insight: Appropriate  Engagement in Group:  Engaged  Modes of Intervention:  Activity  Additional Comments:    Raymond [name] 10/11/2022, 5:37 PM  

## 2022-10-11 NOTE — [facility] Group Notes (Signed)
[facility] Group Notes:  (Nursing/MHT/Case Management/Adjunct)  Date:  10/11/2022  Time:  11:01 AM  Type of Therapy:  Psychoeducational Skills  Participation Level:  Active  Participation Quality:  Appropriate  Affect:  Appropriate  Cognitive:  Appropriate  Insight:  Appropriate  Engagement in Group:  Engaged  Modes of Intervention:  Discussion, Education, and Exploration  Summary of Progress/Problems: Pt attend daily goals group. Shared his goal is to shave today/ Malva Limes 10/11/2022, 11:01 AM

## 2022-10-11 NOTE — Plan of Care (Signed)
  Problem: Education: Goal: Knowledge of General Education information will improve Description: Including pain rating scale, medication(s)/side effects and non-pharmacologic comfort measures Outcome: Progressing   Problem: Health Behavior/Discharge Planning: Goal: Ability to manage health-related needs will improve Outcome: Progressing   Problem: Clinical Measurements: Goal: Ability to maintain clinical measurements within normal limits will improve Outcome: Progressing Goal: Will remain free from infection Outcome: Progressing Goal: Diagnostic test results will improve Outcome: Progressing Goal: Respiratory complications will improve Outcome: Progressing Goal: Cardiovascular complication will be avoided Outcome: Progressing   Problem: Activity: Goal: Risk for activity intolerance will decrease Outcome: Progressing   Problem: Nutrition: Goal: Adequate nutrition will be maintained Outcome: Progressing   Problem: Coping: Goal: Level of anxiety will decrease Outcome: Progressing   Problem: Elimination: Goal: Will not experience complications related to bowel motility Outcome: Progressing Goal: Will not experience complications related to urinary retention Outcome: Progressing   Problem: Pain Managment: Goal: General experience of comfort will improve Outcome: Progressing   Problem: Safety: Goal: Ability to remain free from injury will improve Outcome: Progressing   Problem: Skin Integrity: Goal: Risk for impaired skin integrity will decrease Outcome: Progressing   Problem: Education: Goal: Knowledge of [place] General Education information/materials will improve Outcome: Progressing Goal: Emotional status will improve Outcome: Progressing Goal: Mental status will improve Outcome: Progressing Goal: Verbalization of understanding the information provided will improve Outcome: Progressing   Problem: Coping: Goal: Coping ability will improve Outcome:  Progressing Goal: Will verbalize feelings Outcome: Progressing

## 2022-10-11 NOTE — Progress Notes (Signed)
[facility] MD Progress Note  10/11/2022 11:17 AM Raymond [name]  MRN:  [id number] Subjective: Follow-up for this 42 year old man with a history of traumatic brain injury.  Patient seen this morning.  He seemed to be calm and watching television this morning when I came up to talk with him he told me that he did not sleep well last night and that he was worried because tomorrow was Mother's Day and of course his main driver for his sadness continues to be thinking about his deceased mother.  Patient did not appear to be disorganized or psychotic or agitated.  From what I can see he is pleasant and cooperative and even enthusiastic about recreation on the unit.  Does not report specific suicidal intent. Principal Problem: Traumatic brain injury [facility]) Diagnosis: Principal Problem:   Traumatic brain injury (HCC) Active Problems:   Seizure disorder (HCC)   Substance induced mood disorder (HCC)  Total Time spent with patient: 30 minutes  Past Psychiatric History: Past history of traumatic brain injury and otherwise unclear diagnosis  Past Medical History:  Past Medical History:  Diagnosis Date   Anxiety    Hepatitis C    PTSD (post-traumatic stress disorder)     Past Surgical History:  Procedure Laterality Date   FACIAL FRACTURE SURGERY     metal plate under right eye   Family History: History reviewed. No pertinent family history. Family Psychiatric  History: See previous Social History:  Social History   Substance and Sexual Activity  Alcohol Use Not Currently     Social History   Substance and Sexual Activity  Drug Use Not Currently    Social History   Socioeconomic History   Marital status: Single    Spouse name: Not on file   Number of children: Not on file   Years of education: Not on file   Highest education level: Not on file  Occupational History   Not on file  Tobacco Use   Smoking status: Some Days    Types: Cigarettes   Smokeless tobacco: Never  Vaping Use   Vaping  Use: Never used  Substance and Sexual Activity   Alcohol use: Not Currently   Drug use: Not Currently   Sexual activity: Not on file  Other Topics Concern   Not on file  Social History Narrative   Not on file   Social Determinants of Health   Financial Resource Strain: Not on file  Food Insecurity: Food Insecurity Present (10/08/2022)   Hunger Vital Sign    Worried About Running Out of Food in the Last Year: Sometimes true    Ran Out of Food in the Last Year: Sometimes true  Transportation Needs: Patient Declined (10/08/2022)   PRAPARE - Administrator, Civil Service (Medical): Patient declined    Lack of Transportation (Non-Medical): Patient declined  Physical Activity: Not on file  Stress: Not on file  Social Connections: Not on file   Additional Social History:                         Sleep: Fair  Appetite:  Negative  Current Medications: Current Facility-Administered Medications  Medication Dose Route Frequency Provider Last Rate Last Admin   acetaminophen (TYLENOL) tablet 650 mg  650 mg Oral Q6H PRN Oneta Rack, NP       albuterol (VENTOLIN HFA) 108 (90 Base) MCG/ACT inhaler 2 puff  2 puff Inhalation Q4H PRN Oneta Rack, NP  alum & mag hydroxide-simeth (MAALOX/MYLANTA) 200-200-20 MG/5ML suspension 30 mL  30 mL Oral Q4H PRN Oneta Rack, NP       diphenhydrAMINE (BENADRYL) capsule 50 mg  50 mg Oral TID PRN Oneta Rack, NP       Or   diphenhydrAMINE (BENADRYL) injection 50 mg  50 mg Intramuscular TID PRN Oneta Rack, NP       divalproex (DEPAKOTE) DR tablet 250 mg  250 mg Oral Q12H Neri Samek T, MD   250 mg at 10/11/22 0801   FLUoxetine (PROZAC) capsule 20 mg  20 mg Oral Daily Santanna Whitford T, MD   20 mg at 10/11/22 0801   levETIRAcetam (KEPPRA) tablet 500 mg  500 mg Oral BID Oneta Rack, NP   500 mg at 10/11/22 9147   magnesium hydroxide (MILK OF MAGNESIA) suspension 30 mL  30 mL Oral Daily PRN Oneta Rack, NP        risperiDONE (RISPERDAL) tablet 1 mg  1 mg Oral BID Kaydan Wong, Jackquline Denmark, MD   1 mg at 10/11/22 0803   senna-docusate (Senokot-S) tablet 2 tablet  2 tablet Oral Daily PRN Dearies Meikle, Jackquline Denmark, MD       traZODone (DESYREL) tablet 50 mg  50 mg Oral QHS PRN Oneta Rack, NP   50 mg at 10/10/22 2129    Lab Results: No results found for this or any previous visit (from the past 48 hour(s)).  Blood Alcohol level:  Lab Results  Component Value Date   ETH <10 10/07/2022   ETH 47 (H) 09/27/2022    Metabolic Disorder Labs: No results found for: "HGBA1C", "MPG" No results found for: "PROLACTIN" No results found for: "CHOL", "TRIG", "HDL", "CHOLHDL", "VLDL", "LDLCALC"  Physical Findings: AIMS:  , ,  ,  ,    CIWA:    COWS:     Musculoskeletal: Strength & Muscle Tone: within normal limits Gait & Station: normal Patient leans: N/A  Psychiatric Specialty Exam:  Presentation  General Appearance:  Appropriate for Environment  Eye Contact: Fair  Speech: Clear and Coherent  Speech Volume: Normal  Handedness:No data recorded  Mood and Affect  Mood: Euthymic  Affect: Congruent   Thought Process  Thought Processes: Coherent  Descriptions of Associations:Intact  Orientation:Full (Time, Place and Person)  Thought Content:WDL  History of Schizophrenia/Schizoaffective disorder:No  Duration of Psychotic Symptoms:No data recorded Hallucinations:No data recorded Ideas of Reference:None  Suicidal Thoughts:No data recorded Homicidal Thoughts:No data recorded  Sensorium  Memory: Immediate Fair; Recent Fair  Judgment: Fair  Insight: Fair   Art therapist  Concentration: Good  Attention Span: Good  Recall: Good  Fund of Knowledge: Good  Language: Good   Psychomotor Activity  Psychomotor Activity:No data recorded  Assets  Assets: Desire for Improvement; Leisure Time; Physical Health; Resilience   Sleep  Sleep:No data recorded   Physical  Exam: Physical Exam Vitals and nursing note reviewed.  Constitutional:      Appearance: Normal appearance.  HENT:     Head: Normocephalic and atraumatic.     Mouth/Throat:     Pharynx: Oropharynx is clear.  Eyes:     Pupils: Pupils are equal, round, and reactive to light.  Cardiovascular:     Rate and Rhythm: Normal rate and regular rhythm.  Pulmonary:     Effort: Pulmonary effort is normal.     Breath sounds: Normal breath sounds.  Abdominal:     General: Abdomen is flat.     Palpations: Abdomen  is soft.  Musculoskeletal:        General: Normal range of motion.  Skin:    General: Skin is warm and dry.  Neurological:     General: No focal deficit present.     Mental Status: He is alert. Mental status is at baseline.  Psychiatric:        Attention and Perception: Attention normal.        Mood and Affect: Mood is depressed.        Speech: Speech normal.        Behavior: Behavior normal.        Thought Content: Thought content normal.        Cognition and Memory: Cognition is impaired.        Judgment: Judgment is impulsive.    Review of Systems  Constitutional: Negative.   HENT: Negative.    Eyes: Negative.   Respiratory: Negative.    Cardiovascular: Negative.   Gastrointestinal: Negative.   Musculoskeletal: Negative.   Skin: Negative.   Neurological: Negative.   Psychiatric/Behavioral:  Positive for depression. Negative for hallucinations. The patient is nervous/anxious and has insomnia.    Blood pressure (!) 145/108, pulse (!) 109, temperature 98 F (36.7 C), temperature source Oral, resp. rate 18, height 6\' 1"  (1.854 m), weight 74.8 kg, SpO2 98 %. Body mass index is 21.77 kg/m.   Treatment Plan Summary: Increase Depakote to 500 twice a day for mood stabilization.  Change risperidone to 1 mg in the morning 2 mg at night for mood stabilization and sleep.  Spent time reassuring the patient.  Encourage group attendance.  Possible discharge earlier this upcoming  week.  Mordecai Rasmussen, MD 10/11/2022, 11:17 AM

## 2022-10-11 NOTE — Progress Notes (Signed)
Patient alert and oriented x 4, affect is flat but brightens upon approach, he denies SI/HI/AVH, 15 minutes safety checks maintained will continue to monitor.

## 2022-10-11 NOTE — Progress Notes (Signed)
D- Patient alert and oriented x 4. Affect anxious/mood preoccupied. Denies SI/ HI/ AVH. Patient denies pain. Patient endorses depression and anxiety. His goals of day are health and focus. A- Scheduled medications administered to patient, per MD orders. Support and encouragement provided.  Routine safety checks conducted every 15 minutes without incident.  Patient informed to notify staff with problems or concerns and verbalizes understanding. R- No adverse drug reactions noted.  Patient compliant with medications and treatment plan. Patient receptive, calm cooperative and interacts well with others on the unit.  Patient contracts for safety and  remains safe on the unit at this time.

## 2022-10-11 NOTE — Group Note (Signed)
LCSW Group Therapy Note   Group Date: 10/11/2022 Start Time: 1310 End Time: 1400   Type of Therapy and Topic:  Group Therapy:  Wellness  Participation Level:  Active    Summary of Patient Progress:  The patient attended group. The patient stated that he like to talk to people and hope that he can be the voice for people without one. The patient stated he has taken that everyone is different into consideration and there is no judgement when he talks to someone.   Marshell Levan, LCSWA 10/11/2022  2:56 PM

## 2022-10-11 NOTE — [facility] Group Notes (Signed)
[facility] Group Notes:  (Nursing/MHT/Case Management/Adjunct)  Date:  10/11/2022  Time:  10:44 AM  Type of Therapy:  Psychoeducational Skills  Participation Level:  Active  Participation Quality:  Appropriate  Affect:  Appropriate  Cognitive:  Appropriate  Insight:  Appropriate  Engagement in Group:  Engaged  Modes of Intervention:  Discussion, Education, and Exploration  Summary of Progress/Problems:  Psychoeducational group. Pts were given two poems to read, and educated on the power of negative versus positive thoughts.  Patients discussed the pattern of negative behaviors and how that has impacted their mental health, and what behaviors /heathy coping skills to implement for better mental health. Pt shared '' I realize alcohol is my problem. It is the negative pattern that I need to let go of. I have gotten so drunk and not remembered things I've done that aren't me, I've seen my dad in the hospital with yellow skin from hepatitis and I don't want to be like that. [place] Ouch, Dillon Livermore 10/11/2022, 10:44 AM

## 2022-10-11 NOTE — Group Note (Signed)
Date:  10/11/2022 Time:  8:52 AM  Group Topic/Focus:  Outdoor Recreation/Leisure    Participation Level:  Active  Participation Quality:  Appropriate  Affect:  Appropriate  Cognitive:  Appropriate  Insight: Appropriate  Engagement in Group:  Engaged  Modes of Intervention:  Activity  Additional Comments:    Wilford Corner 10/11/2022, 8:52 AM

## 2022-10-11 NOTE — Progress Notes (Signed)
   10/11/22 2100  Psych Admission Type (Psych Patients Only)  Admission Status Involuntary  Psychosocial Assessment  Patient Complaints Anxiety;Crying spells;Depression (pt is sad due to mothers day being tomorrow, mother passed 2 years ago)  Eye Contact Fair  Facial Expression Animated  Affect Appropriate to circumstance  Speech Rapid  Interaction Assertive  Motor Activity Restless;Hyperactive  Appearance/Hygiene Improved  Behavior Characteristics Cooperative;Appropriate to situation;Anxious  Mood Depressed;Anxious;Pleasant;Preoccupied  Thought Process  Coherency Circumstantial  Content WDL  Delusions WDL  Perception WDL  Hallucination None reported or observed  Judgment WDL  Confusion None  Danger to Self  Current suicidal ideation? Denies  Agreement Not to Harm Self Yes  Description of Agreement Verbal  Danger to Others  Danger to Others None reported or observed

## 2022-10-12 DIAGNOSIS — S069XAA Unspecified intracranial injury with loss of consciousness status unknown, initial encounter: Secondary | ICD-10-CM | POA: Diagnosis not present

## 2022-10-12 LAB — TSH: TSH: 1.428 u[IU]/mL (ref 0.350–4.500)

## 2022-10-12 MED ORDER — LISINOPRIL 5 MG PO TABS
10.0000 mg | ORAL_TABLET | Freq: Every day | ORAL | Status: DC
  Administered 2022-10-12 – 2022-10-16 (×5): 10 mg via ORAL
  Filled 2022-10-12 (×6): qty 2

## 2022-10-12 MED ORDER — HYDROXYZINE HCL 50 MG PO TABS: 50.0000 mg | ORAL_TABLET | Freq: Four times a day (QID) | ORAL | Status: DC | PRN

## 2022-10-12 MED ORDER — CHLORPROMAZINE HCL 25 MG PO TABS
25.0000 mg | ORAL_TABLET | Freq: Three times a day (TID) | ORAL | Status: DC
  Administered 2022-10-12 – 2022-10-13 (×4): 25 mg via ORAL
  Filled 2022-10-12 (×5): qty 1

## 2022-10-12 MED ORDER — DIVALPROEX SODIUM 500 MG PO DR TAB
750.0000 mg | DELAYED_RELEASE_TABLET | Freq: Two times a day (BID) | ORAL | Status: DC
  Administered 2022-10-12 – 2022-10-13 (×2): 750 mg via ORAL
  Filled 2022-10-12 (×2): qty 1

## 2022-10-12 NOTE — Progress Notes (Signed)
D- Patient alert and oriented x 4. Affect anxious/mood anxious and depressed. Denies SI/ HI/ AVH. Patient denies pain. Patient endorses depression and anxiety. States "because of mothers day I have been sad, you know about my mother dying". New medications Thorazine and Lisinopril ordered and instructed on actions doses and adverse reactions. A- Scheduled medications administered to patient, per MD orders. Support and encouragement provided.  Routine safety checks conducted every 15 minutes without incident.  Patient informed to notify staff with problems or concerns and verbalizes understanding. R- No adverse drug reactions noted.  Patient compliant with medications and treatment plan. Patient receptive, calm, cooperative and interacts well with others on the unit.  Patient contracts for safety and  remains safe on the unit at this time.

## 2022-10-12 NOTE — Progress Notes (Signed)
[facility] MD Progress Note  10/12/2022 12:18 PM Raymond [name]  MRN:  [id number] Subjective: Follow-up for this 42 year old man with multiple symptoms.  He tells me he is feeling worse today.  It is Mother's Day and he is focusing on his deceased mother which gets him depressed.  Says last night he had thoughts about hurting himself.  Did not act on it.  No intent to follow through on it today.  The intractable hiccuping type behavior that he has continues to be prominent and seems perhaps even worse today.  I have heard him doing it even when staff were not talking with him. Principal Problem: Traumatic brain injury [facility]) Diagnosis: Principal Problem:   Traumatic brain injury [facility]) Active Problems:   Seizure disorder (HCC)   Substance induced mood disorder (HCC)  Total Time spent with patient: 20 minutes  Past Psychiatric History: Past history of psychiatric care although specific diagnoses seem a little unclear.  Possibly PTSD.  Possibly traumatic brain injury.  Past Medical History:  Past Medical History:  Diagnosis Date   Anxiety    Hepatitis C    PTSD (post-traumatic stress disorder)     Past Surgical History:  Procedure Laterality Date   FACIAL FRACTURE SURGERY     metal plate under right eye   Family History: History reviewed. No pertinent family history. Family Psychiatric  History: None reported Social History:  Social History   Substance and Sexual Activity  Alcohol Use Not Currently     Social History   Substance and Sexual Activity  Drug Use Not Currently    Social History   Socioeconomic History   Marital status: Single    Spouse name: Not on file   Number of children: Not on file   Years of education: Not on file   Highest education level: Not on file  Occupational History   Not on file  Tobacco Use   Smoking status: Some Days    Types: Cigarettes   Smokeless tobacco: Never  Vaping Use   Vaping Use: Never used  Substance and Sexual Activity   Alcohol use:  Not Currently   Drug use: Not Currently   Sexual activity: Not on file  Other Topics Concern   Not on file  Social History Narrative   Not on file   Social Determinants of Health   Financial Resource Strain: Not on file  Food Insecurity: Food Insecurity Present (10/08/2022)   Hunger Vital Sign    Worried About Running Out of Food in the Last Year: Sometimes true    Ran Out of Food in the Last Year: Sometimes true  Transportation Needs: Patient Declined (10/08/2022)   PRAPARE - Administrator, Civil Service (Medical): Patient declined    Lack of Transportation (Non-Medical): Patient declined  Physical Activity: Not on file  Stress: Not on file  Social Connections: Not on file   Additional Social History:                         Sleep: Fair  Appetite:  Fair  Current Medications: Current Facility-Administered Medications  Medication Dose Route Frequency Provider Last Rate Last Admin   acetaminophen (TYLENOL) tablet 650 mg  650 mg Oral Q6H PRN Oneta Rack, NP       albuterol (VENTOLIN HFA) 108 (90 Base) MCG/ACT inhaler 2 puff  2 puff Inhalation Q4H PRN Oneta Rack, NP       alum & mag hydroxide-simeth (MAALOX/MYLANTA) 200-200-20  MG/5ML suspension 30 mL  30 mL Oral Q4H PRN Oneta Rack, NP       chlorproMAZINE (THORAZINE) tablet 25 mg  25 mg Oral TID Kristeen Lantz, Jackquline Denmark, MD       diphenhydrAMINE (BENADRYL) capsule 50 mg  50 mg Oral TID PRN Oneta Rack, NP       Or   diphenhydrAMINE (BENADRYL) injection 50 mg  50 mg Intramuscular TID PRN Oneta Rack, NP       divalproex (DEPAKOTE) DR tablet 750 mg  750 mg Oral Q12H Amiley Shishido T, MD       FLUoxetine (PROZAC) capsule 20 mg  20 mg Oral Daily Joleah Kosak T, MD   20 mg at 10/12/22 4098   hydrOXYzine (ATARAX) tablet 50 mg  50 mg Oral Q6H PRN Kenechukwu Eckstein T, MD       levETIRAcetam (KEPPRA) tablet 500 mg  500 mg Oral BID Oneta Rack, NP   500 mg at 10/12/22 0903   lisinopril (ZESTRIL) tablet 10  mg  10 mg Oral Daily Shaylin Blatt T, MD       magnesium hydroxide (MILK OF MAGNESIA) suspension 30 mL  30 mL Oral Daily PRN Oneta Rack, NP       risperiDONE (RISPERDAL) tablet 1 mg  1 mg Oral Daily Tawana Pasch T, MD   1 mg at 10/12/22 0904   risperiDONE (RISPERDAL) tablet 2 mg  2 mg Oral QHS Dimitri Shakespeare T, MD   2 mg at 10/11/22 2137   senna-docusate (Senokot-S) tablet 2 tablet  2 tablet Oral Daily PRN Petr Bontempo, Jackquline Denmark, MD       traZODone (DESYREL) tablet 50 mg  50 mg Oral QHS PRN Oneta Rack, NP   50 mg at 10/10/22 2129    Lab Results: No results found for this or any previous visit (from the past 48 hour(s)).  Blood Alcohol level:  Lab Results  Component Value Date   ETH <10 10/07/2022   ETH 47 (H) 09/27/2022    Metabolic Disorder Labs: No results found for: "HGBA1C", "MPG" No results found for: "PROLACTIN" No results found for: "CHOL", "TRIG", "HDL", "CHOLHDL", "VLDL", "LDLCALC"  Physical Findings: AIMS:  , ,  ,  ,    CIWA:    COWS:     Musculoskeletal: Strength & Muscle Tone: within normal limits Gait & Station: normal Patient leans: N/A  Psychiatric Specialty Exam:  Presentation  General Appearance:  Appropriate for Environment  Eye Contact: Fair  Speech: Clear and Coherent  Speech Volume: Normal  Handedness:No data recorded  Mood and Affect  Mood: Euthymic  Affect: Congruent   Thought Process  Thought Processes: Coherent  Descriptions of Associations:Intact  Orientation:Full (Time, Place and Person)  Thought Content:WDL  History of Schizophrenia/Schizoaffective disorder:No  Duration of Psychotic Symptoms:No data recorded Hallucinations:No data recorded Ideas of Reference:None  Suicidal Thoughts:No data recorded Homicidal Thoughts:No data recorded  Sensorium  Memory: Immediate Fair; Recent Fair  Judgment: Fair  Insight: Fair   Art therapist  Concentration: Good  Attention  Span: Good  Recall: Good  Fund of Knowledge: Good  Language: Good   Psychomotor Activity  Psychomotor Activity:No data recorded  Assets  Assets: Desire for Improvement; Leisure Time; Physical Health; Resilience   Sleep  Sleep:No data recorded   Physical Exam: Physical Exam Vitals and nursing note reviewed.  Constitutional:      Appearance: Normal appearance.  HENT:     Head: Normocephalic and atraumatic.  Mouth/Throat:     Pharynx: Oropharynx is clear.  Eyes:     Pupils: Pupils are equal, round, and reactive to light.  Cardiovascular:     Rate and Rhythm: Normal rate and regular rhythm.  Pulmonary:     Effort: Pulmonary effort is normal.     Breath sounds: Normal breath sounds.  Abdominal:     General: Abdomen is flat.     Palpations: Abdomen is soft.  Musculoskeletal:        General: Normal range of motion.  Skin:    General: Skin is warm and dry.  Neurological:     General: No focal deficit present.     Mental Status: He is alert. Mental status is at baseline.  Psychiatric:        Attention and Perception: Attention normal.        Mood and Affect: Mood is anxious and depressed. Affect is blunt.        Speech: Speech normal.        Behavior: Behavior is cooperative.        Thought Content: Thought content normal.    Review of Systems  Constitutional: Negative.   HENT: Negative.    Eyes: Negative.   Respiratory: Negative.    Cardiovascular: Negative.   Gastrointestinal: Negative.   Musculoskeletal: Negative.   Skin: Negative.   Neurological: Negative.   Psychiatric/Behavioral:  Positive for depression and suicidal ideas. The patient is nervous/anxious.    Blood pressure (!) 154/116, pulse (!) 104, temperature 97.9 F (36.6 C), temperature source Oral, resp. rate 18, height 6\' 1"  (1.854 m), weight 74.8 kg, SpO2 100 %. Body mass index is 21.77 kg/m.   Treatment Plan Summary: Medication management and Plan patient reminded me today that he  had lots of medical records supposedly in [place].  I told him I had searched on line with [facility] and not found any of those records.  Patient continues to insist that he has "3000 pages" of medical records.  Practically, I noticed that his blood pressure is continuing to run elevated pretty consistently.  I am going to start him on 10 mg of lisinopril.  Additionally at least so far the risk Bedol does not seem to have extinguished the hiccuping and neither does the Depakote.  I am going to increase the Depakote to 750 mg twice a day and also add 25 mg of Thorazine 3 times a day specifically for intractable hiccups and see if that helps.  If he gets over sedated we will have to step back on some of it.  Encouraged him to keep spending time around others as he is doing as I am sure that helps with his mood.  Mordecai Rasmussen, MD 10/12/2022, 12:18 PM

## 2022-10-12 NOTE — Plan of Care (Signed)
  Problem: Education: Goal: Knowledge of General Education information will improve Description: Including pain rating scale, medication(s)/side effects and non-pharmacologic comfort measures Outcome: Progressing   Problem: Health Behavior/Discharge Planning: Goal: Ability to manage health-related needs will improve Outcome: Progressing   Problem: Clinical Measurements: Goal: Ability to maintain clinical measurements within normal limits will improve Outcome: Progressing Goal: Will remain free from infection Outcome: Progressing Goal: Diagnostic test results will improve Outcome: Progressing Goal: Respiratory complications will improve Outcome: Progressing Goal: Cardiovascular complication will be avoided Outcome: Progressing   Problem: Activity: Goal: Risk for activity intolerance will decrease Outcome: Progressing   Problem: Nutrition: Goal: Adequate nutrition will be maintained Outcome: Progressing   Problem: Coping: Goal: Level of anxiety will decrease Outcome: Progressing   Problem: Elimination: Goal: Will not experience complications related to bowel motility Outcome: Progressing Goal: Will not experience complications related to urinary retention Outcome: Progressing   Problem: Pain Managment: Goal: General experience of comfort will improve Outcome: Progressing   Problem: Safety: Goal: Ability to remain free from injury will improve Outcome: Progressing   Problem: Skin Integrity: Goal: Risk for impaired skin integrity will decrease Outcome: Progressing   Problem: Education: Goal: Knowledge of [place] [facility] Education information/materials will improve Outcome: Progressing Goal: Emotional status will improve Outcome: Progressing Goal: Mental status will improve Outcome: Progressing Goal: Verbalization of understanding the information provided will improve Outcome: Progressing   Problem: Coping: Goal: Coping ability will improve Outcome:  Progressing Goal: Will verbalize feelings Outcome: Progressing   

## 2022-10-12 NOTE — Progress Notes (Signed)
   10/12/22 0551  15 Minute Checks  Location Bedroom  Visual Appearance Calm  Behavior Sleeping  Sleep (Behavioral Health Patients Only)  Calculate sleep? (Click Yes once per 24 hr at 0600 safety check) Yes  Documented sleep last 24 hours 6

## 2022-10-13 DIAGNOSIS — S069XAA Unspecified intracranial injury with loss of consciousness status unknown, initial encounter: Secondary | ICD-10-CM | POA: Diagnosis not present

## 2022-10-13 MED ORDER — CHLORPROMAZINE HCL 10 MG PO TABS
10.0000 mg | ORAL_TABLET | Freq: Three times a day (TID) | ORAL | Status: DC
  Administered 2022-10-13 – 2022-10-16 (×9): 10 mg via ORAL
  Filled 2022-10-13 (×10): qty 1

## 2022-10-13 MED ORDER — DIVALPROEX SODIUM 500 MG PO DR TAB
500.0000 mg | DELAYED_RELEASE_TABLET | Freq: Two times a day (BID) | ORAL | Status: DC
  Administered 2022-10-13 – 2022-10-15 (×4): 500 mg via ORAL
  Filled 2022-10-13 (×4): qty 1

## 2022-10-13 NOTE — Group Note (Signed)
Recreation Therapy Group Note   Group Topic:Goal Setting  Group Date: 10/13/2022 Start Time: 1005 End Time: 1110 Facilitators: Rosina Lowenstein, LRT, CTRS Location:  Craft Room  Group Description: Vision Board. Patients were given many different magazines, a glue stick, markers, and a piece of cardstock paper. LRT and pts discussed the importance of having goals in life. LRT and pts discussed the difference between short-term and long-term goals, as well as what a SMART goal is. LRT encouraged pts to create a vision board, with images they picked and then cut out with safety scissors from the magazine, for themselves, that capture their short and long-term goals. LRT encouraged pts to show and explain their vision board to the group. LRT offered to laminate vision board once dry and complete.   Goal Area(s) Addressed:  Patient will gain knowledge of short vs. long term goals.  Patient will identify goals for themselves. Patient will practice setting SMART goals. Patient will verbalize their goals to LRT and peers.  Affect/Mood: Appropriate and Full range   Participation Level: Active and Engaged   Participation Quality: Independent   Behavior: Appropriate, Cooperative, and Eager   Speech/Thought Process: Coherent   Insight: Good   Judgement: Good   Modes of Intervention: Art   Patient Response to Interventions:  Attentive, Engaged, Interested , and Receptive   Education Outcome:  Acknowledges education   Clinical Observations/Individualized Feedback: Edrin was active in their participation of session activities and group discussion. Pt identified "I want a wife and kids one day, a boy and girl. I want to go back to [place] one day. I want to cook and eat good food." Pt appropriately found items in the magazines that relate to his goals. Pt interacted well with LRT and peers duration of session.    Plan: Continue to engage patient in RT group sessions 2-3x/week.   Rosina Lowenstein, LRT, CTRS 10/13/2022 11:54 AM

## 2022-10-13 NOTE — Progress Notes (Signed)
Continues to complain of anxiety, seizures, and depression. Noted in dayroom talking with peers, eating snack and drinking. Patient requested prn for sleep, reports wants to have a good nights sleep tonight. No complaints of pain or discomfort, just walks around rubbing his chest at times. Denies Si, Hi, AVh.. Encouragement and support provided. Safety checks maintained. Medications given as prescribed. Pt receptive and remains safe on unit with q 15 min checks.

## 2022-10-13 NOTE — Progress Notes (Signed)
Patient alert and oriented x 4, affect is flat but brightens upon approach, he denies SI/HI/AVH, complaint with medication and interacting appropriately.15 minutes safety checks maintained will continue to monitor.

## 2022-10-13 NOTE — [facility] Group Notes (Signed)
[facility] Group Notes:  (Nursing/MHT/Case Management/Adjunct)  Date:  10/13/2022  Time:  10:11 AM  Type of Therapy:  Psychoeducational Skills  Participation Level:  Active  Participation Quality:  Appropriate  Affect:  Appropriate  Cognitive:  Alert and Appropriate  Insight:  Appropriate  Engagement in Group:  Engaged  Modes of Intervention:  Discussion, Education, and Exploration  Summary of Progress/Problems:  Teaching on identifying warning signs of mental health crisis, and how to implement healthy lifestyle/healthy coping to maintain mental health. Raymond [name] shared '' I really enjoy cooking and singing, they help me cope. I also really like to drink coffee to help my stress too.'' Raymond [name] 10/13/2022, 10:11 AM

## 2022-10-13 NOTE — Group Note (Signed)
[facility] LCSW Group Therapy Note    Group Date: 10/13/2022 Start Time: 1311 End Time: 1345  Type of Therapy and Topic:  Group Therapy:  Overcoming Obstacles  Participation Level:  [facility] PARTICIPATION LEVEL: Active   Description of Group:   In this group patients will be encouraged to explore what they see as obstacles to their own wellness and recovery. They will be guided to discuss their thoughts, feelings, and behaviors related to these obstacles. The group will process together ways to cope with barriers, with attention given to specific choices patients can make. Each patient will be challenged to identify changes they are motivated to make in order to overcome their obstacles. This group will be process-oriented, with patients participating in exploration of their own experiences as well as giving and receiving support and challenge from other group members.  Therapeutic Goals: 1. Patient will identify personal and current obstacles as they relate to admission. 2. Patient will identify barriers that currently interfere with their wellness or overcoming obstacles.  3. Patient will identify feelings, thought process and behaviors related to these barriers. 4. Patient will identify two changes they are willing to make to overcome these obstacles:    Summary of Patient Progress Patient was present for the entirety of the group process. He shared that he had a rough weekend due to memories/thoughts around his own mother. Pt shared that his medications are being adjusted and his body is not reacting well to them. He stated that he has been tired all day. Pt and CSW discussed group activity from this morning in which they made vision boards. He shared that he put a little house, food, a dog, a rooster, a child, a woman, and a wedding scene. Pt shared that for him these things mean home, that he likes to cook, he's a dog person, he's from [place] he  wants children, and a girlfriend who eventually becomes a wife. Pt and CSW discussed his pattern of behavior to focus on helping others. He was encouraged to think about how he will begin to pour into himself as well instead of always pouring into others' lives. He shared that this was a good question but he did not have an answer to that yet. Pt spoke about the importance of taking his medications, seeing his therapist, and talk to his case manager. He also shared that he needs a psychiatrist. Pt and CSW discussed ACTT services. He voiced interest in being connected with these services as he will soon lose his case manager because he has been in that program for two years. Insight into the topic is questionable. He appeared open and receptive to feedback/comments from facilitator.    Therapeutic Modalities:   Cognitive Behavioral Therapy Solution Focused Therapy Motivational Interviewing Relapse Prevention Therapy   Glenis Smoker, LCSW

## 2022-10-13 NOTE — Progress Notes (Signed)
[facility] MD Progress Note  10/13/2022 1:18 PM Raymond [name]  MRN:  [id number] Subjective: Patient seen and chart reviewed.  Patient was very sleepy today taking a nap in the middle of the day that I actually had to work to wake him up from.  He acknowledged being sleepy but also said he was feeling a little bit better.  I am not sure whether the hiccuping is clearly better or not.  He said that he did feel like the medicine was much stronger today.  Nevertheless denied acute suicidal thoughts.  Still no dangerous behavior on the unit. Principal Problem: Traumatic brain injury [facility]) Diagnosis: Principal Problem:   Traumatic brain injury (HCC) Active Problems:   Seizure disorder (HCC)   Substance induced mood disorder (HCC)  Total Time spent with patient: 20 minutes  Past Psychiatric History: Past history of reported traumatic brain injury unclear underlying diagnosis  Past Medical History:  Past Medical History:  Diagnosis Date   Anxiety    Hepatitis C    PTSD (post-traumatic stress disorder)     Past Surgical History:  Procedure Laterality Date   FACIAL FRACTURE SURGERY     metal plate under right eye   Family History: History reviewed. No pertinent family history. Family Psychiatric  History: See previous Social History:  Social History   Substance and Sexual Activity  Alcohol Use Not Currently     Social History   Substance and Sexual Activity  Drug Use Not Currently    Social History   Socioeconomic History   Marital status: Single    Spouse name: Not on file   Number of children: Not on file   Years of education: Not on file   Highest education level: Not on file  Occupational History   Not on file  Tobacco Use   Smoking status: Some Days    Types: Cigarettes   Smokeless tobacco: Never  Vaping Use   Vaping Use: Never used  Substance and Sexual Activity   Alcohol use: Not Currently   Drug use: Not Currently   Sexual activity: Not on file  Other Topics Concern    Not on file  Social History Narrative   Not on file   Social Determinants of Health   Financial Resource Strain: Not on file  Food Insecurity: Food Insecurity Present (10/08/2022)   Hunger Vital Sign    Worried About Running Out of Food in the Last Year: Sometimes true    Ran Out of Food in the Last Year: Sometimes true  Transportation Needs: Patient Declined (10/08/2022)   PRAPARE - Administrator, Civil Service (Medical): Patient declined    Lack of Transportation (Non-Medical): Patient declined  Physical Activity: Not on file  Stress: Not on file  Social Connections: Not on file   Additional Social History:                         Sleep: Fair  Appetite:  Fair  Current Medications: Current Facility-Administered Medications  Medication Dose Route Frequency Provider Last Rate Last Admin   acetaminophen (TYLENOL) tablet 650 mg  650 mg Oral Q6H PRN Oneta Rack, NP       albuterol (VENTOLIN HFA) 108 (90 Base) MCG/ACT inhaler 2 puff  2 puff Inhalation Q4H PRN Oneta Rack, NP       alum & mag hydroxide-simeth (MAALOX/MYLANTA) 200-200-20 MG/5ML suspension 30 mL  30 mL Oral Q4H PRN Oneta Rack, NP  chlorproMAZINE (THORAZINE) tablet 10 mg  10 mg Oral TID Paizlie Klaus, Jackquline Denmark, MD       diphenhydrAMINE (BENADRYL) capsule 50 mg  50 mg Oral TID PRN Oneta Rack, NP       Or   diphenhydrAMINE (BENADRYL) injection 50 mg  50 mg Intramuscular TID PRN Oneta Rack, NP       divalproex (DEPAKOTE) DR tablet 500 mg  500 mg Oral Q12H Thanh Pomerleau T, MD       FLUoxetine (PROZAC) capsule 20 mg  20 mg Oral Daily Khalon Cansler T, MD   20 mg at 10/13/22 1610   hydrOXYzine (ATARAX) tablet 50 mg  50 mg Oral Q6H PRN Faduma Cho T, MD       levETIRAcetam (KEPPRA) tablet 500 mg  500 mg Oral BID Oneta Rack, NP   500 mg at 10/13/22 0836   lisinopril (ZESTRIL) tablet 10 mg  10 mg Oral Daily Richey Doolittle T, MD   10 mg at 10/13/22 9604   magnesium hydroxide (MILK  OF MAGNESIA) suspension 30 mL  30 mL Oral Daily PRN Oneta Rack, NP       risperiDONE (RISPERDAL) tablet 1 mg  1 mg Oral Daily Kaelem Brach T, MD   1 mg at 10/13/22 0836   risperiDONE (RISPERDAL) tablet 2 mg  2 mg Oral QHS Tarra Pence T, MD   2 mg at 10/12/22 2109   senna-docusate (Senokot-S) tablet 2 tablet  2 tablet Oral Daily PRN Genevieve Ritzel, Jackquline Denmark, MD       traZODone (DESYREL) tablet 50 mg  50 mg Oral QHS PRN Oneta Rack, NP   50 mg at 10/12/22 2110    Lab Results:  Results for orders placed or performed during the hospital encounter of 10/08/22 (from the past 48 hour(s))  TSH     Status: None   Collection Time: 10/12/22 12:49 PM  Result Value Ref Range   TSH 1.428 0.350 - 4.500 uIU/mL    Comment: Performed by a 3rd Generation assay with a functional sensitivity of <=0.01 uIU/mL. Performed at [facility], [address] [place], [place] [address]     Blood Alcohol level:  Lab Results  Component Value Date   ETH <10 10/07/2022   ETH 47 (H) 09/27/2022    Metabolic Disorder Labs: No results found for: "HGBA1C", "MPG" No results found for: "PROLACTIN" No results found for: "CHOL", "TRIG", "HDL", "CHOLHDL", "VLDL", "LDLCALC"  Physical Findings: AIMS:  , ,  ,  ,    CIWA:    COWS:     Musculoskeletal: Strength & Muscle Tone: within normal limits Gait & Station: normal Patient leans: N/A  Psychiatric Specialty Exam:  Presentation  General Appearance:  Appropriate for Environment  Eye Contact: Fair  Speech: Clear and Coherent  Speech Volume: Normal  Handedness:No data recorded  Mood and Affect  Mood: Euthymic  Affect: Congruent   Thought Process  Thought Processes: Coherent  Descriptions of Associations:Intact  Orientation:Full (Time, Place and Person)  Thought Content:WDL  History of Schizophrenia/Schizoaffective disorder:No  Duration of Psychotic Symptoms:No data recorded Hallucinations:No data recorded Ideas of  Reference:None  Suicidal Thoughts:No data recorded Homicidal Thoughts:No data recorded  Sensorium  Memory: Immediate Fair; Recent Fair  Judgment: Fair  Insight: Fair   Art therapist  Concentration: Good  Attention Span: Good  Recall: Good  Fund of Knowledge: Good  Language: Good   Psychomotor Activity  Psychomotor Activity:No data recorded  Assets  Assets: Desire for  Improvement; Leisure Time; Physical Health; Resilience   Sleep  Sleep:No data recorded   Physical Exam: Physical Exam Vitals and nursing note reviewed.  Constitutional:      Appearance: Normal appearance.  HENT:     Head: Normocephalic and atraumatic.     Mouth/Throat:     Pharynx: Oropharynx is clear.  Eyes:     Pupils: Pupils are equal, round, and reactive to light.  Cardiovascular:     Rate and Rhythm: Normal rate and regular rhythm.  Pulmonary:     Effort: Pulmonary effort is normal.     Breath sounds: Normal breath sounds.  Abdominal:     General: Abdomen is flat.     Palpations: Abdomen is soft.  Musculoskeletal:        General: Normal range of motion.  Skin:    General: Skin is warm and dry.  Neurological:     General: No focal deficit present.     Mental Status: He is alert. Mental status is at baseline.  Psychiatric:        Attention and Perception: He is inattentive.        Mood and Affect: Mood normal. Affect is blunt.        Speech: Speech is delayed.        Behavior: Behavior is slowed.        Thought Content: Thought content normal.    Review of Systems  Constitutional:  Positive for malaise/fatigue.  HENT: Negative.    Eyes: Negative.   Respiratory: Negative.    Cardiovascular: Negative.   Gastrointestinal: Negative.   Musculoskeletal: Negative.   Skin: Negative.   Neurological: Negative.   Psychiatric/Behavioral: Negative.     Blood pressure 108/67, pulse 89, temperature 97.7 F (36.5 C), temperature source Oral, resp. rate 20, height 6\' 1"   (1.854 m), weight 74.8 kg, SpO2 95 %. Body mass index is 21.77 kg/m.   Treatment Plan Summary: Medication management and Plan a little too sleepy today.  I acknowledged that I had increased medicines several at once and we will back down on them.  I am going to decrease the Thorazine to 10 mg instead of 25 and cut the Depakote back to 500 twice a day.  Blood pressure much improved today.  No other changes.  Patient agrees to plan  Mordecai Rasmussen, MD 10/13/2022, 1:18 PM

## 2022-10-13 NOTE — Progress Notes (Signed)
Patient ID: Raymond [name], male   DOB: 04-02-1981, 42 y.o.   MRN: [id number] Pt presents with depressed mood, affect congruent. [name] upon approach and is cooperative and pleasant with staff. He has been attending programming on the unit and has been watching television in dayroom with peers. He is eating and drinking well. Pt did report continued depressed mood, but states that he '' slept much better last night so I feel better today ''  Patient completed self inventory and rates his depression at 7/10 on scale, 10 being worst, 0 being none. Pt rates his hopelessness at 7/10 on scale 10 being worst 0 being none. Pt rates his anxiety  at 9/10 on scale 10 being worst , 0 being none.  Pt also reported to writer that he had '' stabbing muscle pain in my chest for two days but I didn't say anything about it because I just thought it would go away '' Pt offered prn pain medications but declined, and MD also notified. No further orders received. Patient has been ambulating on the unit , able to make his needs known and appears in no acute distress.

## 2022-10-13 NOTE — Plan of Care (Signed)
  Problem: Education: Goal: Knowledge of General Education information will improve Description: Including pain rating scale, medication(s)/side effects and non-pharmacologic comfort measures Outcome: Progressing   Problem: Nutrition: Goal: Adequate nutrition will be maintained Outcome: Progressing   Problem: Coping: Goal: Level of anxiety will decrease Outcome: Not Progressing   Problem: Pain Managment: Goal: General experience of comfort will improve Outcome: Progressing   Problem: Safety: Goal: Ability to remain free from injury will improve Outcome: Progressing

## 2022-10-14 DIAGNOSIS — S069XAA Unspecified intracranial injury with loss of consciousness status unknown, initial encounter: Secondary | ICD-10-CM | POA: Diagnosis not present

## 2022-10-14 MED ORDER — RISPERIDONE 1 MG PO TABS
3.0000 mg | ORAL_TABLET | Freq: Every day | ORAL | Status: DC
  Administered 2022-10-14 – 2022-10-15 (×2): 3 mg via ORAL
  Filled 2022-10-14 (×2): qty 3

## 2022-10-14 NOTE — Plan of Care (Signed)

## 2022-10-14 NOTE — Group Note (Signed)
Recreation Therapy Group Note   Group Topic:Coping Skills  Group Date: 10/14/2022 Start Time: 1000 End Time: 1050 Facilitators: Rosina Lowenstein, LRT, CTRS Location:  Craft Room  Group Description: Mind Map.  Patient was provided a blank template of a diagram with 32 blank boxes in a tiered system, branching from the center (similar to a bubble chart). LRT directed patients to label the middle of the diagram "Coping Skills". LRT and patients then came up with 8 different coping skills as examples. Pt were directed to record their coping skills in the 2nd tier boxes closest to the center.  Patients would then share their coping skills with the group as LRT wrote them out. LRT gave a handout of 100 different coping skills at the end of group.   Goal Area(s) Addressed: Patients will be able to define "coping skills". Patient will identify new coping skills.  Patient will identify new possible leisure interests.   Affect/Mood: Appropriate   Participation Level: Active and Engaged   Participation Quality: Independent and Minimal Cues   Behavior: Appropriate, Cooperative, and Eager   Speech/Thought Process: Coherent   Insight: Good   Judgement: Fair    Modes of Intervention: Guided Discussion and Worksheet   Patient Response to Interventions:  Attentive, Engaged, Interested , and Receptive   Education Outcome:  Acknowledges education   Clinical Observations/Individualized Feedback: Raymond [name] was active in their participation of session activities and group discussion. Pt identified "stargazing, lifting weights, meditate, and playing games" as coping skills. Pt spontaneously contributed to group discussion, however required redirection to be quiet and let LRT and peers speak. Pt was receptive to feedback and apologized.   Plan: Continue to engage patient in RT group sessions 2-3x/week.   Rosina Lowenstein, LRT, CTRS 10/14/2022 11:10 AM

## 2022-10-14 NOTE — [facility] Group Notes (Signed)
[facility] Group Notes:  (Nursing/MHT/Case Management/Adjunct)  Date:  10/14/2022  Time:  10:35 AM  Type of Therapy:  Psychoeducational Skills  Participation Level:  Active  Participation Quality:  Appropriate and Attentive  Affect:  Appropriate  Cognitive:  Alert and Appropriate  Insight:  Appropriate and Good  Engagement in Group:  Engaged  Modes of Intervention:  Discussion, Education, and Exploration  Summary of Progress/Problems:  Patients were given poem by Karren Burly '' There's a hole in my sidewalk '' Patients were then asked to reflect on self sabotaging behaviors, and ways to implement healthier changes for their mental health. Raymond [name] shared '' when I'm feeling depressed I isolate and I get more irritable and that's the exact worse thing for me to do Malva Limes 10/14/2022, 10:35 AM

## 2022-10-14 NOTE — Progress Notes (Signed)
[facility] MD Progress Note  10/14/2022 2:17 PM Raymond [name]  MRN:  [id number] Subjective: Follow-up for this patient with complicated and somewhat unclear history.  No new behavior problems.  Today he was not sleepy the way he was yesterday.  He still complains of not sleeping well at night.  When first engaged in conversation he will immediately complain of a long list of problems and then told me that he wanted to go to rehab.  Following up on these all the details appear vague.  He did give me permission to speak to a woman he has worked with through the [employer] and [employer].  I spoke with that person who was able to educate me that the patient is not eligible for VA services because he did not serve in the [employer] long enough.  They have also worked with him and been unclear about diagnosis. Principal Problem: Traumatic brain injury [facility]) Diagnosis: Principal Problem:   Traumatic brain injury [facility]) Active Problems:   Seizure disorder (HCC)   Substance induced mood disorder (HCC)  Total Time spent with patient: 30 minutes  Past Psychiatric History: Details somewhat lacking.  Patient reports a history of traumatic brain injury  Past Medical History:  Past Medical History:  Diagnosis Date   Anxiety    Hepatitis C    PTSD (post-traumatic stress disorder)     Past Surgical History:  Procedure Laterality Date   FACIAL FRACTURE SURGERY     metal plate under right eye   Family History: History reviewed. No pertinent family history. Family Psychiatric  History: See above Social History:  Social History   Substance and Sexual Activity  Alcohol Use Not Currently     Social History   Substance and Sexual Activity  Drug Use Not Currently    Social History   Socioeconomic History   Marital status: Single    Spouse name: Not on file   Number of children: Not on file   Years of education: Not on file   Highest education level: Not on file  Occupational History   Not on  file  Tobacco Use   Smoking status: Some Days    Types: Cigarettes   Smokeless tobacco: Never  Vaping Use   Vaping Use: Never used  Substance and Sexual Activity   Alcohol use: Not Currently   Drug use: Not Currently   Sexual activity: Not on file  Other Topics Concern   Not on file  Social History Narrative   Not on file   Social Determinants of Health   Financial Resource Strain: Not on file  Food Insecurity: Food Insecurity Present (10/08/2022)   Hunger Vital Sign    Worried About Running Out of Food in the Last Year: Sometimes true    Ran Out of Food in the Last Year: Sometimes true  Transportation Needs: Patient Declined (10/08/2022)   PRAPARE - Administrator, Civil Service (Medical): Patient declined    Lack of Transportation (Non-Medical): Patient declined  Physical Activity: Not on file  Stress: Not on file  Social Connections: Not on file   Additional Social History:                         Sleep: Fair  Appetite:  Fair  Current Medications: Current Facility-Administered Medications  Medication Dose Route Frequency Provider Last Rate Last Admin   acetaminophen (TYLENOL) tablet 650 mg  650 mg Oral Q6H PRN Oneta Rack, NP  albuterol (VENTOLIN HFA) 108 (90 Base) MCG/ACT inhaler 2 puff  2 puff Inhalation Q4H PRN Oneta Rack, NP       alum & mag hydroxide-simeth (MAALOX/MYLANTA) 200-200-20 MG/5ML suspension 30 mL  30 mL Oral Q4H PRN Oneta Rack, NP       chlorproMAZINE (THORAZINE) tablet 10 mg  10 mg Oral TID Kinsey Karch T, MD   10 mg at 10/14/22 1141   diphenhydrAMINE (BENADRYL) capsule 50 mg  50 mg Oral TID PRN Oneta Rack, NP       Or   diphenhydrAMINE (BENADRYL) injection 50 mg  50 mg Intramuscular TID PRN Oneta Rack, NP       divalproex (DEPAKOTE) DR tablet 500 mg  500 mg Oral Q12H Sachin Ferencz T, MD   500 mg at 10/14/22 0732   FLUoxetine (PROZAC) capsule 20 mg  20 mg Oral Daily Bartlett Enke T, MD   20 mg at  10/14/22 0732   hydrOXYzine (ATARAX) tablet 50 mg  50 mg Oral Q6H PRN Shaquoya Cosper, Jackquline Denmark, MD       levETIRAcetam (KEPPRA) tablet 500 mg  500 mg Oral BID Oneta Rack, NP   500 mg at 10/14/22 0732   lisinopril (ZESTRIL) tablet 10 mg  10 mg Oral Daily Claudine Stallings T, MD   10 mg at 10/14/22 0732   magnesium hydroxide (MILK OF MAGNESIA) suspension 30 mL  30 mL Oral Daily PRN Oneta Rack, NP       risperiDONE (RISPERDAL) tablet 1 mg  1 mg Oral Daily Yacine Droz T, MD   1 mg at 10/14/22 0732   risperiDONE (RISPERDAL) tablet 3 mg  3 mg Oral QHS Saree Krogh T, MD       senna-docusate (Senokot-S) tablet 2 tablet  2 tablet Oral Daily PRN Carilyn Woolston, Jackquline Denmark, MD       traZODone (DESYREL) tablet 50 mg  50 mg Oral QHS PRN Oneta Rack, NP   50 mg at 10/13/22 2057    Lab Results: No results found for this or any previous visit (from the past 48 hour(s)).  Blood Alcohol level:  Lab Results  Component Value Date   ETH <10 10/07/2022   ETH 47 (H) 09/27/2022    Metabolic Disorder Labs: No results found for: "HGBA1C", "MPG" No results found for: "PROLACTIN" No results found for: "CHOL", "TRIG", "HDL", "CHOLHDL", "VLDL", "LDLCALC"  Physical Findings: AIMS:  , ,  ,  ,    CIWA:    COWS:     Musculoskeletal: Strength & Muscle Tone: within normal limits Gait & Station: normal Patient leans: N/A  Psychiatric Specialty Exam:  Presentation  General Appearance:  Appropriate for Environment  Eye Contact: Fair  Speech: Clear and Coherent  Speech Volume: Normal  Handedness:No data recorded  Mood and Affect  Mood: Euthymic  Affect: Congruent   Thought Process  Thought Processes: Coherent  Descriptions of Associations:Intact  Orientation:Full (Time, Place and Person)  Thought Content:WDL  History of Schizophrenia/Schizoaffective disorder:No  Duration of Psychotic Symptoms:No data recorded Hallucinations:No data recorded Ideas of Reference:None  Suicidal Thoughts:No  data recorded Homicidal Thoughts:No data recorded  Sensorium  Memory: Immediate Fair; Recent Fair  Judgment: Fair  Insight: Fair   Art therapist  Concentration: Good  Attention Span: Good  Recall: Good  Fund of Knowledge: Good  Language: Good   Psychomotor Activity  Psychomotor Activity:No data recorded  Assets  Assets: Desire for Improvement; Leisure Time; Physical Health; Resilience   Sleep  Sleep:No data recorded   Physical Exam: Physical Exam Vitals and nursing note reviewed.  Constitutional:      Appearance: Normal appearance.  HENT:     Head: Normocephalic and atraumatic.     Mouth/Throat:     Pharynx: Oropharynx is clear.  Eyes:     Pupils: Pupils are equal, round, and reactive to light.  Cardiovascular:     Rate and Rhythm: Normal rate and regular rhythm.  Pulmonary:     Effort: Pulmonary effort is normal.     Breath sounds: Normal breath sounds.  Abdominal:     General: Abdomen is flat.     Palpations: Abdomen is soft.  Musculoskeletal:        General: Normal range of motion.  Skin:    General: Skin is warm and dry.  Neurological:     General: No focal deficit present.     Mental Status: He is alert. Mental status is at baseline.  Psychiatric:        Attention and Perception: He is inattentive.        Mood and Affect: Mood normal. Affect is blunt.        Speech: Speech is tangential.        Behavior: Behavior is slowed. Behavior is cooperative.        Thought Content: Thought content normal.        Cognition and Memory: Cognition is impaired.    Review of Systems  Constitutional: Negative.   HENT: Negative.    Eyes: Negative.   Respiratory: Negative.    Cardiovascular: Negative.   Gastrointestinal: Negative.   Musculoskeletal: Negative.   Skin: Negative.   Neurological: Negative.   Psychiatric/Behavioral:  Negative for suicidal ideas. The patient is nervous/anxious and has insomnia.    Blood pressure (!) 141/85,  pulse 92, temperature 98.3 F (36.8 C), resp. rate 19, height 6\' 1"  (1.854 m), weight 74.8 kg, SpO2 97 %. Body mass index is 21.77 kg/m.   Treatment Plan Summary: Medication management and Plan the hiccuping does seem to have improved and he is not as sedated so I will leave the Thorazine 10 mg in place.  No change to other medicine.  Reviewed with him how important it would be for him to have a outpatient follow-up long-term.  Possible discharge by later this week.  Mordecai Rasmussen, MD 10/14/2022, 2:17 PM

## 2022-10-14 NOTE — Progress Notes (Signed)
Patient ID: Raymond [name], male   DOB: 09-02-1980, 42 y.o.   MRN: [id number] Raymond [name] presents with pleasant mood, affect congruent. He reports poor sleep last night stating '' yeah I was tired during the day yesterday but I slept worse last night. I don't know I've always had insomnia.'' Sleep hygiene encouraged as Raymond [name] repeatedly drinks coffee throughout the day, he verbalized understanding.  Pt denies any SI HI or AV hallucinations today. He is engaged in the milieu and interactive with peers. He has been attending programming and voiced appreciation for groups.  Pt completed self inventory and rates his depression, anxiety and hopelessness all at 7/10 on scale, 10 being worst, 0 being none.  Pt states his goal is to focus. He denies any pain or any acute concerns. Appears in no acute distress at this time. Will con't to monitor. Pt is safe.

## 2022-10-14 NOTE — [facility] Group Notes (Signed)
[facility] Group Notes:  (Nursing/MHT/Case Management/Adjunct)  Date:  10/14/2022  Time:  3:59 PM  Type of Therapy:   BINGO ACTIVITY  Participation Level:  Active  Participation Quality:  Appropriate  Affect:  Appropriate  Cognitive:  Appropriate  Insight:  Appropriate  Engagement in Group:  Engaged  Modes of Intervention:  Activity  Summary of Progress/Problems:  Bingo Activity - with snack prizes, pt was engaged and voiced enjoying activity. Malva Limes 10/14/2022, 3:59 PM

## 2022-10-15 MED ORDER — TRAZODONE HCL 50 MG PO TABS
150.0000 mg | ORAL_TABLET | Freq: Every day | ORAL | Status: DC
  Administered 2022-10-15: 150 mg via ORAL
  Filled 2022-10-15: qty 1

## 2022-10-15 NOTE — Group Note (Signed)
Date:  10/15/2022 Time:  12:37 AM  Group Topic/Focus:  Wrap-Up Group:   The focus of this group is to help patients review their daily goal of treatment and discuss progress on daily workbooks.    Participation Level:  Active  Participation Quality:  Appropriate and Attentive  Affect:  Appropriate and Blunted  Cognitive:  Appropriate  Insight: Improving  Engagement in Group:  Engaged and Monopolizing  Modes of Intervention:  Discussion  Additional Comments:     Maglione,Ranette Luckadoo E 10/15/2022, 12:37 AM

## 2022-10-15 NOTE — Progress Notes (Signed)
[facility] MD Progress Note  10/15/2022 1:29 PM Raymond [name]  MRN:  [id number] Subjective: Follow-up 43 year old man with complex and somewhat unclear psychiatric history.  Patient complains that he slept very poorly last night feeling like he only slept an hour.  He also says he feels sick to his stomach during the night and vomited at least 1 time.  Feeling tired during the day now.  Mood is somewhat improved although the physical symptoms make it hard for him to judge.  No active suicidal intent or plan.  Not acting as though he were having active psychotic symptoms Principal Problem: Traumatic brain injury (HCC) Diagnosis: Principal Problem:   Traumatic brain injury (HCC) Active Problems:   Seizure disorder (HCC)   Substance induced mood disorder (HCC)  Total Time spent with patient: 30 minutes  Past Psychiatric History: Past history reportedly of multiple complex conditions and diagnoses although records are unavailable  Past Medical History:  Past Medical History:  Diagnosis Date   Anxiety    Hepatitis C    PTSD (post-traumatic stress disorder)     Past Surgical History:  Procedure Laterality Date   FACIAL FRACTURE SURGERY     metal plate under right eye   Family History: History reviewed. No pertinent family history. Family Psychiatric  History: See previous Social History:  Social History   Substance and Sexual Activity  Alcohol Use Not Currently     Social History   Substance and Sexual Activity  Drug Use Not Currently    Social History   Socioeconomic History   Marital status: Single    Spouse name: Not on file   Number of children: Not on file   Years of education: Not on file   Highest education level: Not on file  Occupational History   Not on file  Tobacco Use   Smoking status: Some Days    Types: Cigarettes   Smokeless tobacco: Never  Vaping Use   Vaping Use: Never used  Substance and Sexual Activity   Alcohol use: Not Currently   Drug use: Not  Currently   Sexual activity: Not on file  Other Topics Concern   Not on file  Social History Narrative   Not on file   Social Determinants of Health   Financial Resource Strain: Not on file  Food Insecurity: Food Insecurity Present (10/08/2022)   Hunger Vital Sign    Worried About Running Out of Food in the Last Year: Sometimes true    Ran Out of Food in the Last Year: Sometimes true  Transportation Needs: Patient Declined (10/08/2022)   PRAPARE - Administrator, Civil Service (Medical): Patient declined    Lack of Transportation (Non-Medical): Patient declined  Physical Activity: Not on file  Stress: Not on file  Social Connections: Not on file   Additional Social History:                         Sleep: Fair  Appetite:  Fair  Current Medications: Current Facility-Administered Medications  Medication Dose Route Frequency Provider Last Rate Last Admin   acetaminophen (TYLENOL) tablet 650 mg  650 mg Oral Q6H PRN Oneta Rack, NP       albuterol (VENTOLIN HFA) 108 (90 Base) MCG/ACT inhaler 2 puff  2 puff Inhalation Q4H PRN Oneta Rack, NP       alum & mag hydroxide-simeth (MAALOX/MYLANTA) 200-200-20 MG/5ML suspension 30 mL  30 mL Oral Q4H PRN Oneta Rack,  NP       chlorproMAZINE (THORAZINE) tablet 10 mg  10 mg Oral TID Jonique Kulig T, MD   10 mg at 10/15/22 1002   diphenhydrAMINE (BENADRYL) capsule 50 mg  50 mg Oral TID PRN Oneta Rack, NP       Or   diphenhydrAMINE (BENADRYL) injection 50 mg  50 mg Intramuscular TID PRN Oneta Rack, NP       FLUoxetine (PROZAC) capsule 20 mg  20 mg Oral Daily Girtie Wiersma T, MD   20 mg at 10/15/22 1001   hydrOXYzine (ATARAX) tablet 50 mg  50 mg Oral Q6H PRN Lakeyn Dokken, Jackquline Denmark, MD       levETIRAcetam (KEPPRA) tablet 500 mg  500 mg Oral BID Oneta Rack, NP   500 mg at 10/15/22 1002   lisinopril (ZESTRIL) tablet 10 mg  10 mg Oral Daily Indigo Barbian T, MD   10 mg at 10/15/22 1001   magnesium hydroxide (MILK  OF MAGNESIA) suspension 30 mL  30 mL Oral Daily PRN Oneta Rack, NP       risperiDONE (RISPERDAL) tablet 1 mg  1 mg Oral Daily Ha Shannahan T, MD   1 mg at 10/15/22 1001   risperiDONE (RISPERDAL) tablet 3 mg  3 mg Oral QHS Olga Bourbeau T, MD   3 mg at 10/14/22 2038   senna-docusate (Senokot-S) tablet 2 tablet  2 tablet Oral Daily PRN Britnee Mcdevitt, Jackquline Denmark, MD       traZODone (DESYREL) tablet 150 mg  150 mg Oral QHS Zakariye Nee T, MD        Lab Results: No results found for this or any previous visit (from the past 48 hour(s)).  Blood Alcohol level:  Lab Results  Component Value Date   ETH <10 10/07/2022   ETH 47 (H) 09/27/2022    Metabolic Disorder Labs: No results found for: "HGBA1C", "MPG" No results found for: "PROLACTIN" No results found for: "CHOL", "TRIG", "HDL", "CHOLHDL", "VLDL", "LDLCALC"  Physical Findings: AIMS:  , ,  ,  ,    CIWA:    COWS:     Musculoskeletal: Strength & Muscle Tone: within normal limits Gait & Station: normal Patient leans: N/A  Psychiatric Specialty Exam:  Presentation  General Appearance:  Appropriate for Environment  Eye Contact: Fair  Speech: Clear and Coherent  Speech Volume: Normal  Handedness:No data recorded  Mood and Affect  Mood: Euthymic  Affect: Congruent   Thought Process  Thought Processes: Coherent  Descriptions of Associations:Intact  Orientation:Full (Time, Place and Person)  Thought Content:WDL  History of Schizophrenia/Schizoaffective disorder:No  Duration of Psychotic Symptoms:No data recorded Hallucinations:No data recorded Ideas of Reference:None  Suicidal Thoughts:No data recorded Homicidal Thoughts:No data recorded  Sensorium  Memory: Immediate Fair; Recent Fair  Judgment: Fair  Insight: Fair   Art therapist  Concentration: Good  Attention Span: Good  Recall: Good  Fund of Knowledge: Good  Language: Good   Psychomotor Activity  Psychomotor Activity:No  data recorded  Assets  Assets: Desire for Improvement; Leisure Time; Physical Health; Resilience   Sleep  Sleep:No data recorded   Physical Exam: Physical Exam Vitals reviewed.  Constitutional:      Appearance: Normal appearance.  HENT:     Head: Normocephalic and atraumatic.     Mouth/Throat:     Pharynx: Oropharynx is clear.  Eyes:     Pupils: Pupils are equal, round, and reactive to light.  Cardiovascular:     Rate and Rhythm: Normal  rate and regular rhythm.  Pulmonary:     Effort: Pulmonary effort is normal.     Breath sounds: Normal breath sounds.  Abdominal:     General: Abdomen is flat.     Palpations: Abdomen is soft.  Musculoskeletal:        General: Normal range of motion.  Skin:    General: Skin is warm and dry.  Neurological:     General: No focal deficit present.     Mental Status: He is alert. Mental status is at baseline.  Psychiatric:        Attention and Perception: Attention normal.        Mood and Affect: Mood normal. Affect is blunt.        Speech: Speech normal.        Behavior: Behavior is slowed.        Thought Content: Thought content normal.    Review of Systems  Constitutional: Negative.   HENT: Negative.    Eyes: Negative.   Respiratory: Negative.    Cardiovascular: Negative.   Gastrointestinal:  Positive for nausea and vomiting.  Musculoskeletal: Negative.   Skin: Negative.   Neurological: Negative.   Psychiatric/Behavioral:  Positive for depression. The patient has insomnia.    Blood pressure (!) 121/96, pulse (!) 110, temperature 97.7 F (36.5 C), temperature source Oral, resp. rate 20, height 6\' 1"  (1.854 m), weight 74.8 kg, SpO2 95 %. Body mass index is 21.77 kg/m.   Treatment Plan Summary: Medication management and Plan the most likely thing that could be causing this nausea is the Depakote that was just added.  I am going to discontinue that at this point.  He has antinausea medicine available.  No other change to  psychiatric medicine except to increase trazodone dose nightly to 150 mg for sleep.  I am still hoping that perhaps things can be stable enough for discharge later this week.  Mordecai Rasmussen, MD 10/15/2022, 1:29 PM

## 2022-10-15 NOTE — Group Note (Signed)
Recreation Therapy Group Note   Group Topic:Relaxation  Group Date: 10/15/2022 Start Time: 1000 End Time: 1045 Facilitators: Rosina Lowenstein, LRT, CTRS Location:  Craft Room  Group Description: PMR (Progressive Muscle Relaxation). LRT asks patients their current level of stress/anxiety from 1-10, with 10 being the highest. LRT educates patients on what PMR is and the benefits that come from it. Patients are asked to sit with their feet flat on the floor while sitting up and all the way back in their chair, if possible. LRT and pts follow a prompt through a speaker that requires you to tense and release different muscles in their body and focus on their breathing. During session, lights are off and soft music is being played. At the end of the prompt, LRT asks patients to rank their current levels of stress/anxiety from 1-10, 10 being the highest.   Goal Area(s) Addressed:  Patients will be able to describe progressive muscle relaxation.  Patient will practice using relaxation technique. Patient will identify a new coping skill.  Patient will follow multistep directions to reduce anxiety and stress.  Affect/Mood: Appropriate   Participation Level: Active and Engaged   Participation Quality: Independent   Behavior: Calm and Cooperative   Speech/Thought Process: Coherent   Insight: Good   Judgement: Good   Modes of Intervention: Activity   Patient Response to Interventions:  Attentive, Engaged, Interested , and Receptive   Education Outcome:  Acknowledges education   Clinical Observations/Individualized Feedback: Raymond [name] was active in their participation of session activities and group discussion. Pt identified that his stress and anxiety levels were an 8 before the session. Pt shared that after the session, his stress and anxiety levels were a 3. Pt shared that this exercise almost made him fall asleep. Of note, pt did not have any "tics" or make any random vocal noises duration of  session.    Plan: Continue to engage patient in RT group sessions 2-3x/week.   Rosina Lowenstein, LRT, CTRS 10/15/2022 10:55 AM

## 2022-10-15 NOTE — Progress Notes (Signed)
Patient pleasant and cooperative. Requested prn for sleep. Given with good relief. Patient in milieu, interacting appropriately with peers and staff. Denies SI, HI, AVH. Endorses anxiety and depression. Eating and drinking well. Encouragement and support provided. Safety checks maintained. Medications given as prescribed. Pt receptive and remains safe on unit with q 15 min checks.

## 2022-10-15 NOTE — Plan of Care (Signed)
  Problem: Education: Goal: Knowledge of General Education information will improve Description: Including pain rating scale, medication(s)/side effects and non-pharmacologic comfort measures Outcome: Progressing   Problem: Health Behavior/Discharge Planning: Goal: Ability to manage health-related needs will improve Outcome: Progressing   Problem: Clinical Measurements: Goal: Ability to maintain clinical measurements within normal limits will improve Outcome: Progressing Goal: Will remain free from infection Outcome: Progressing Goal: Diagnostic test results will improve Outcome: Progressing Goal: Respiratory complications will improve Outcome: Progressing Goal: Cardiovascular complication will be avoided Outcome: Progressing   Problem: Activity: Goal: Risk for activity intolerance will decrease Outcome: Progressing   Problem: Nutrition: Goal: Adequate nutrition will be maintained Outcome: Progressing   Problem: Coping: Goal: Level of anxiety will decrease Outcome: Progressing   Problem: Elimination: Goal: Will not experience complications related to bowel motility Outcome: Progressing Goal: Will not experience complications related to urinary retention Outcome: Progressing   Problem: Pain Managment: Goal: General experience of comfort will improve Outcome: Progressing   Problem: Safety: Goal: Ability to remain free from injury will improve Outcome: Progressing   Problem: Skin Integrity: Goal: Risk for impaired skin integrity will decrease Outcome: Progressing   Problem: Education: Goal: Knowledge of [place] [facility] Education information/materials will improve Outcome: Progressing Goal: Emotional status will improve Outcome: Progressing Goal: Mental status will improve Outcome: Progressing Goal: Verbalization of understanding the information provided will improve Outcome: Progressing   Problem: Coping: Goal: Coping ability will improve Outcome:  Progressing Goal: Will verbalize feelings Outcome: Progressing   

## 2022-10-15 NOTE — Group Note (Signed)
Date:  10/15/2022 Time:  6:15 PM  Group Topic/Focus:  Outdoor Recreation    Participation Level:  Active  Participation Quality:  Appropriate  Affect:  Appropriate  Cognitive:  Appropriate  Insight: Appropriate  Engagement in Group:  Engaged  Modes of Intervention:  Activity  Additional Comments:    Wilford Corner 10/15/2022, 6:15 PM

## 2022-10-15 NOTE — BH IP Treatment Plan (Signed)
Interdisciplinary Treatment and Diagnostic Plan Update  10/15/2022 Time of Session: 08:30 Davyd Garrido MRN: [id number]  Principal Diagnosis: Traumatic brain injury [facility])  Secondary Diagnoses: Principal Problem:   Traumatic brain injury [facility]) Active Problems:   Seizure disorder (HCC)   Substance induced mood disorder (HCC)   Current Medications:  Current Facility-Administered Medications  Medication Dose Route Frequency Provider Last Rate Last Admin   acetaminophen (TYLENOL) tablet 650 mg  650 mg Oral Q6H PRN Oneta Rack, NP       albuterol (VENTOLIN HFA) 108 (90 Base) MCG/ACT inhaler 2 puff  2 puff Inhalation Q4H PRN Oneta Rack, NP       alum & mag hydroxide-simeth (MAALOX/MYLANTA) 200-200-20 MG/5ML suspension 30 mL  30 mL Oral Q4H PRN Oneta Rack, NP       chlorproMAZINE (THORAZINE) tablet 10 mg  10 mg Oral TID Clapacs, [name], MD   10 mg at 10/15/22 1002   diphenhydrAMINE (BENADRYL) capsule 50 mg  50 mg Oral TID PRN Oneta Rack, NP       Or   diphenhydrAMINE (BENADRYL) injection 50 mg  50 mg Intramuscular TID PRN Oneta Rack, NP       divalproex (DEPAKOTE) DR tablet 500 mg  500 mg Oral Q12H Clapacs, [name], MD   500 mg at 10/15/22 1001   FLUoxetine (PROZAC) capsule 20 mg  20 mg Oral Daily Clapacs, [name], MD   20 mg at 10/15/22 1001   hydrOXYzine (ATARAX) tablet 50 mg  50 mg Oral Q6H PRN Clapacs, Jackquline Denmark, MD       levETIRAcetam (KEPPRA) tablet 500 mg  500 mg Oral BID Oneta Rack, NP   500 mg at 10/15/22 1002   lisinopril (ZESTRIL) tablet 10 mg  10 mg Oral Daily Clapacs, [name], MD   10 mg at 10/15/22 1001   magnesium hydroxide (MILK OF MAGNESIA) suspension 30 mL  30 mL Oral Daily PRN Oneta Rack, NP       risperiDONE (RISPERDAL) tablet 1 mg  1 mg Oral Daily Clapacs, [name], MD   1 mg at 10/15/22 1001   risperiDONE (RISPERDAL) tablet 3 mg  3 mg Oral QHS Clapacs, [name], MD   3 mg at 10/14/22 2038   senna-docusate (Senokot-S) tablet 2 tablet  2 tablet Oral  Daily PRN Clapacs, Jackquline Denmark, MD       traZODone (DESYREL) tablet 50 mg  50 mg Oral QHS PRN Oneta Rack, NP   50 mg at 10/14/22 2038   PTA Medications: Medications Prior to Admission  Medication Sig Dispense Refill Last Dose   albuterol (VENTOLIN HFA) 108 (90 Base) MCG/ACT inhaler Inhale 2 puffs into the lungs every 4 (four) hours as needed for wheezing or shortness of breath. 6.7 g 0    FLUoxetine (PROZAC) 10 MG capsule Take 1 capsule (10 mg total) by mouth daily. 30 capsule 0    levETIRAcetam (KEPPRA) 500 MG tablet Take 1 tablet (500 mg total) by mouth 2 (two) times daily. 60 tablet 0    risperiDONE (RISPERDAL) 0.5 MG tablet Take 1 tablet (0.5 mg total) by mouth 2 (two) times daily. 60 tablet 0    traZODone (DESYREL) 50 MG tablet Take 1 tablet (50 mg total) by mouth at bedtime as needed for sleep. 30 tablet 0     Patient Stressors: Financial difficulties   Health problems   Medication change or noncompliance   Substance abuse   Traumatic event  Patient Strengths: Ability for insight  Capable of independent living  Communication skills  General fund of knowledge   Treatment Modalities: Medication Management, Group therapy, Case management,  1 to 1 session with clinician, Psychoeducation, Recreational therapy.   Physician Treatment Plan for Primary Diagnosis: Traumatic brain injury [facility]) Long Term Goal(s): Improvement in symptoms so as ready for discharge   Short Term Goals: Ability to maintain clinical measurements within normal limits will improve Ability to disclose and discuss suicidal ideas Ability to demonstrate self-control will improve  Medication Management: Evaluate patient's response, side effects, and tolerance of medication regimen.  Therapeutic Interventions: 1 to 1 sessions, Unit Group sessions and Medication administration.  Evaluation of Outcomes: Progressing  Physician Treatment Plan for Secondary Diagnosis: Principal Problem:   Traumatic brain injury  [facility]) Active Problems:   Seizure disorder (HCC)   Substance induced mood disorder (HCC)  Long Term Goal(s): Improvement in symptoms so as ready for discharge   Short Term Goals: Ability to maintain clinical measurements within normal limits will improve Ability to disclose and discuss suicidal ideas Ability to demonstrate self-control will improve     Medication Management: Evaluate patient's response, side effects, and tolerance of medication regimen.  Therapeutic Interventions: 1 to 1 sessions, Unit Group sessions and Medication administration.  Evaluation of Outcomes: Progressing   RN Treatment Plan for Primary Diagnosis: Traumatic brain injury (HCC) Long Term Goal(s): Knowledge of disease and therapeutic regimen to maintain health will improve  Short Term Goals: Ability to remain free from injury will improve, Ability to verbalize frustration and anger appropriately will improve, Ability to demonstrate self-control, Ability to participate in decision making will improve, Ability to verbalize feelings will improve, Ability to disclose and discuss suicidal ideas, Ability to identify and develop effective coping behaviors will improve, and Compliance with prescribed medications will improve  Medication Management: RN will administer medications as ordered by provider, will assess and evaluate patient's response and provide education to patient for prescribed medication. RN will report any adverse and/or side effects to prescribing provider.  Therapeutic Interventions: 1 on 1 counseling sessions, Psychoeducation, Medication administration, Evaluate responses to treatment, Monitor vital signs and CBGs as ordered, Perform/monitor CIWA, COWS, AIMS and Fall Risk screenings as ordered, Perform wound care treatments as ordered.  Evaluation of Outcomes: Progressing   LCSW Treatment Plan for Primary Diagnosis: Traumatic brain injury [facility]) Long Term Goal(s): Safe transition to appropriate next  level of care at discharge, Engage patient in therapeutic group addressing interpersonal concerns.  Short Term Goals: Engage patient in aftercare planning with referrals and resources, Increase social support, Increase ability to appropriately verbalize feelings, Increase emotional regulation, Facilitate acceptance of mental health diagnosis and concerns, Facilitate patient progression through stages of change regarding substance use diagnoses and concerns, Identify triggers associated with mental health/substance abuse issues, and Increase skills for wellness and recovery  Therapeutic Interventions: Assess for all discharge needs, 1 to 1 time with Social worker, Explore available resources and support systems, Assess for adequacy in community support network, Educate family and significant other(s) on suicide prevention, Complete Psychosocial Assessment, Interpersonal group therapy.  Evaluation of Outcomes: Progressing   Progress in Treatment: Attending groups: Yes. Participating in groups: Yes. Taking medication as prescribed: Yes. Toleration medication: Yes. Family/Significant other contact made: No, will contact:  if contact information can be found. Patient understands diagnosis: Yes. Discussing patient identified problems/goals with staff: Yes. Medical problems stabilized or resolved: No. Denies suicidal/homicidal ideation: Yes. Issues/concerns per patient self-inventory: No. Other: none.  New problem(s) identified:  No, Describe:  none identified. 10/15/22 Update: No changes at this time.   New Short Term/Long Term Goal(s): medication management for mood stabilization; elimination of SI thoughts; development of comprehensive mental wellness/sobriety plan. 10/15/22 Update: No changes at this time.    Patient Goals:  "I just want to get rid of my panic attacks, my seizures to get better, and my thoughts of hurting myself have gotten worse." 10/15/22 Update: No changes at this time.    Discharge Plan or Barriers: CSW will assist pt with development of an appropriate aftercare/discharge plan. 10/15/22 Update: No changes at this time.   Reason for Continuation of Hospitalization: Anxiety Depression Medication stabilization Suicidal ideation   Estimated Length of Stay: 1-7 days 10/15/22 Update: No changes at this time.  Last 3 [place] Suicide Severity Risk Score: Flowsheet Row Admission (Current) from 10/08/2022 in [facility] INPATIENT BEH[facility]AL MEDICINE ED from 10/07/2022 in [facility] Emergency Department at [facility] ED from 09/27/2022 in [facility] Emergency Department at [facility]  C-SSRS RISK CATEGORY High Risk High Risk High Risk       Last [facility] 2/9 Scores:     No data to display          Scribe for Treatment Team: Glenis Smoker, LCSW 10/15/2022 10:29 AM

## 2022-10-15 NOTE — Group Note (Signed)
[facility] LCSW Group Therapy Note   Group Date: 10/15/2022 Start Time: 1300 End Time: 1400   Type of Therapy/Topic:  Group Therapy:  Emotion Regulation  Participation Level:  Active   Mood:  Description of Group:    The purpose of this group is to assist patients in learning to regulate negative emotions and experience positive emotions. Patients will be guided to discuss ways in which they have been vulnerable to their negative emotions. These vulnerabilities will be juxtaposed with experiences of positive emotions or situations, and patients challenged to use positive emotions to combat negative ones. Special emphasis will be placed on coping with negative emotions in conflict situations, and patients will process healthy conflict resolution skills.  Therapeutic Goals: Patient will identify two positive emotions or experiences to reflect on in order to balance out negative emotions:  Patient will label two or more emotions that they find the most difficult to experience:  Patient will be able to demonstrate positive conflict resolution skills through discussion or role plays:   Summary of Patient Progress:   Patient was present for the entirety of the group session. Patient was an active listener and participated in the topic of discussion, provided helpful advice to others, and added nuance to topic of conversation. Patient states that he often copes with feelings of frustration and anger, though recognizes he should communicate with others about these negative emotions.     Therapeutic Modalities:   Cognitive Behavioral Therapy Feelings Identification Dialectical Behavioral Therapy   Corky Crafts, [place]

## 2022-10-15 NOTE — Progress Notes (Signed)
D- Patient alert and oriented x4. Affect anxious/mood preoccupied but pleasant. Denies SI/ HI/ AVH. Patient denies pain. He endorses depression and anxiety. His goal today is to "focus". Complaints of nausea this AM and Depakote D/Ced. States he ate "all of dinner" and nausea has subsided. A- Scheduled medications administered to patient, per MD orders. Support and encouragement provided.  Routine safety checks conducted every 15 minutes without incident.  Patient informed to notify staff with problems or concerns and verbalizes understanding. R- No adverse drug reactions noted.  Patient compliant with medications and treatment plan. Patient receptive, calm cooperative and interacts well with others on the unit.  Patient contracts for safety and  remains safe on the unit at this time.

## 2022-10-15 NOTE — Progress Notes (Signed)
Patient calm and pleasant during assessment denying SI/HI/AVH. Pt observed interacting appropriately with staff and peers on the unit. Pt compliant with medication administration per MD orders. Pt given education, support, and encouragement to be active in his treatment plan. Pt being monitored Q 15 minutes for safety per unit protocol, remains safe on the unit  

## 2022-10-15 NOTE — Plan of Care (Signed)
  Problem: Coping: Goal: Level of anxiety will decrease Outcome: Progressing   Problem: Elimination: Goal: Will not experience complications related to bowel motility Outcome: Progressing   Problem: Safety: Goal: Ability to remain free from injury will improve Outcome: Progressing   Problem: Coping: Goal: Coping ability will improve Outcome: Progressing

## 2022-10-16 ENCOUNTER — Other Ambulatory Visit: Payer: Self-pay

## 2022-10-16 MED ORDER — LISINOPRIL 10 MG PO TABS: 10.0000 mg | ORAL_TABLET | Freq: Every day | ORAL | 1 refills | Status: DC

## 2022-10-16 MED ORDER — FLUOXETINE HCL 20 MG PO CAPS
20.0000 mg | ORAL_CAPSULE | Freq: Every day | ORAL | 0 refills | Status: DC
  Filled 2022-10-16: qty 10, 10d supply, fill #0

## 2022-10-16 MED ORDER — RISPERIDONE 3 MG PO TABS
3.0000 mg | ORAL_TABLET | Freq: Every day | ORAL | 0 refills | Status: DC
  Filled 2022-10-16: qty 10, 10d supply, fill #0

## 2022-10-16 MED ORDER — LISINOPRIL 10 MG PO TABS
10.0000 mg | ORAL_TABLET | Freq: Every day | ORAL | 0 refills | Status: DC
  Filled 2022-10-16: qty 10, 10d supply, fill #0

## 2022-10-16 MED ORDER — CHLORPROMAZINE HCL 10 MG PO TABS: 10.0000 mg | ORAL_TABLET | Freq: Three times a day (TID) | ORAL | 1 refills | Status: DC

## 2022-10-16 MED ORDER — CHLORPROMAZINE HCL 10 MG PO TABS
10.0000 mg | ORAL_TABLET | Freq: Three times a day (TID) | ORAL | 0 refills | Status: DC
  Filled 2022-10-16: qty 30, 10d supply, fill #0

## 2022-10-16 MED ORDER — LEVETIRACETAM 500 MG PO TABS: 500.0000 mg | ORAL_TABLET | Freq: Two times a day (BID) | ORAL | 1 refills | Status: DC

## 2022-10-16 MED ORDER — FLUOXETINE HCL 20 MG PO CAPS: 20.0000 mg | ORAL_CAPSULE | Freq: Every day | ORAL | 1 refills | Status: DC

## 2022-10-16 MED ORDER — RISPERIDONE 1 MG PO TABS: 1.0000 mg | ORAL_TABLET | Freq: Every day | ORAL | 1 refills | Status: DC

## 2022-10-16 MED ORDER — ALBUTEROL SULFATE HFA 108 (90 BASE) MCG/ACT IN AERS: 2.0000 | INHALATION_SPRAY | RESPIRATORY_TRACT | 1 refills | Status: DC | PRN

## 2022-10-16 MED ORDER — LEVETIRACETAM 500 MG PO TABS
500.0000 mg | ORAL_TABLET | Freq: Two times a day (BID) | ORAL | 0 refills | Status: DC
  Filled 2022-10-16 (×2): qty 20, 10d supply, fill #0

## 2022-10-16 MED ORDER — TRAZODONE HCL 150 MG PO TABS: 150.0000 mg | ORAL_TABLET | Freq: Every day | ORAL | 1 refills | Status: DC

## 2022-10-16 MED ORDER — ALBUTEROL SULFATE HFA 108 (90 BASE) MCG/ACT IN AERS
2.0000 | INHALATION_SPRAY | RESPIRATORY_TRACT | 0 refills | Status: AC | PRN
  Filled 2022-10-16: qty 6.7, 25d supply, fill #0

## 2022-10-16 MED ORDER — TRAZODONE HCL 150 MG PO TABS
150.0000 mg | ORAL_TABLET | Freq: Every day | ORAL | 0 refills | Status: DC
  Filled 2022-10-16: qty 10, 10d supply, fill #0

## 2022-10-16 MED ORDER — HYDROXYZINE HCL 50 MG PO TABS
50.0000 mg | ORAL_TABLET | Freq: Four times a day (QID) | ORAL | 0 refills | Status: DC | PRN
  Filled 2022-10-16: qty 10, 3d supply, fill #0

## 2022-10-16 MED ORDER — RISPERIDONE 1 MG PO TABS
1.0000 mg | ORAL_TABLET | Freq: Every day | ORAL | 0 refills | Status: DC
  Filled 2022-10-16: qty 10, 10d supply, fill #0

## 2022-10-16 MED ORDER — RISPERIDONE 3 MG PO TABS: 3.0000 mg | ORAL_TABLET | Freq: Every day | ORAL | 1 refills | Status: DC

## 2022-10-16 MED ORDER — HYDROXYZINE HCL 50 MG PO TABS: 50.0000 mg | ORAL_TABLET | Freq: Four times a day (QID) | ORAL | 1 refills | Status: DC | PRN

## 2022-10-16 NOTE — Group Note (Signed)
[facility] LCSW Group Therapy Note   Group Date: 10/16/2022 Start Time: 1320 End Time: 1400   Type of Therapy/Topic:  Group Therapy:  Balance in Life  Participation Level:  Active   Description of Group:    This group will address the concept of balance and how it feels and looks when one is unbalanced. Patients will be encouraged to process areas in their lives that are out of balance, and identify reasons for remaining unbalanced. Facilitators will guide patients utilizing problem- solving interventions to address and correct the stressor making their life unbalanced. Understanding and applying boundaries will be explored and addressed for obtaining  and maintaining a balanced life. Patients will be encouraged to explore ways to assertively make their unbalanced needs known to significant others in their lives, using other group members and facilitator for support and feedback.  Therapeutic Goals: Patient will identify two or more emotions or situations they have that consume much of in their lives. Patient will identify signs/triggers that life has become out of balance:  Patient will identify two ways to set boundaries in order to achieve balance in their lives:  Patient will demonstrate ability to communicate their needs through discussion and/or role plays  Summary of Patient Progress: Patient was present for the majority of the group process. He defined balance as a good thing and a measure of how stable something/someone is, when things are in order. Depression, anxiety, panic attacks, and thinking about his losses (two babies and mother) were triggers that cause him to become off balanced. He shared an example of his girlfriend losing the babies and stated that they were fine for awhile but then he became jealous and insecure. Pt explained that he was having a lot of negative self talk, like she will find someone who can give her children (although pt was able to acknowledge that her  miscarriages had little to nothing to do with him but were just natural occurences). Eventually they broke up and then pt lost his mother which was too much for him. He also spoke about times in which he was able to regain balance which he attributed to just being able to talk to the right people, sometimes random people. Focusing more on himself, trusting himself more, believing in himself, and talking to God more were identified as things that he needs to do to help maintain balance after leaving the hospital. Pt has some insight into the topic and appeared open and receptive to feedback/comments from the facilitator.    Therapeutic Modalities:   Cognitive Behavioral Therapy Solution-Focused Therapy Assertiveness Training   Glenis Smoker, LCSW

## 2022-10-16 NOTE — Discharge Summary (Signed)
Physician Discharge Summary Note  Patient:  Raymond [name] is an 42 y.o., male MRN:  [id number] DOB:  03-Jan-1981 Patient phone:  [phone] (home)  Patient address:   [address] [place] [address],  Total Time spent with patient: 30 minutes  Date of Admission:  10/08/2022 Date of Discharge: 10/16/2022  Reason for Admission: Patient was admitted for stabilization after an alleged overdose on medication with suicidal ideation  Principal Problem: Traumatic brain injury [facility]) Discharge Diagnoses: Principal Problem:   Traumatic brain injury [facility]) Active Problems:   Seizure disorder (HCC)   Substance induced mood disorder (HCC)   Past Psychiatric History: Patient gives a history of multiple complex symptoms and conditions.  He reports that he has a history of a traumatic brain injury.  Claims to have a seizure disorder.  Says he has been diagnosed with schizophrenia and bipolar disorder in the past.  So far records have been unavailable.  Has had 1 prior hospitalization to this here in our facility.  Past Medical History:  Past Medical History:  Diagnosis Date   Anxiety    Hepatitis C    PTSD (post-traumatic stress disorder)     Past Surgical History:  Procedure Laterality Date   FACIAL FRACTURE SURGERY     metal plate under right eye   Family History: History reviewed. No pertinent family history. Family Psychiatric  History: See previous Social History:  Social History   Substance and Sexual Activity  Alcohol Use Not Currently     Social History   Substance and Sexual Activity  Drug Use Not Currently    Social History   Socioeconomic History   Marital status: Single    Spouse name: Not on file   Number of children: Not on file   Years of education: Not on file   Highest education level: Not on file  Occupational History   Not on file  Tobacco Use   Smoking status: Some Days    Types: Cigarettes   Smokeless tobacco: Never  Vaping Use    Vaping Use: Never used  Substance and Sexual Activity   Alcohol use: Not Currently   Drug use: Not Currently   Sexual activity: Not on file  Other Topics Concern   Not on file  Social History Narrative   Not on file   Social Determinants of Health   Financial Resource Strain: Not on file  Food Insecurity: Food Insecurity Present (10/08/2022)   Hunger Vital Sign    Worried About Running Out of Food in the Last Year: Sometimes true    Ran Out of Food in the Last Year: Sometimes true  Transportation Needs: Patient Declined (10/08/2022)   PRAPARE - Administrator, Civil Service (Medical): Patient declined    Lack of Transportation (Non-Medical): Patient declined  Physical Activity: Not on file  Stress: Not on file  Social Connections: Not on file    Hospital Course: Admitted to psychiatric hospital.  15-minute checks continued.  Patient did not display any dangerous behavior towards himself or anyone else while in the hospital.  Early on talked about having suicidal thoughts and also talked about that on Mother's Day but now is denying any suicidal ideation.  Has been compliant with medicine.  On at least one occasion he claims to have had a seizure but nothing was ever witnessed and his description of it is not necessarily consistent with an epileptic seizure.  Patient was tried on Depakote for mood stabilization but was unable  to tolerate it due to nausea and GI distress.  He is currently on a combination of Prozac and risk Bedol and trazodone as main psychiatric medicines.  Continue Keppra for seizure disorder.  Spoke with his Child psychotherapist who has seen him through the [place] system in [place].  Confirmed that currently he does not have benefits through the [employer] but an appeal has been filed.  Meanwhile will be following up with family services at the [place].  Physical Findings: AIMS:  , ,  ,  ,    CIWA:    COWS:     Musculoskeletal: Strength & Muscle  Tone: within normal limits Gait & Station: normal Patient leans: N/A   Psychiatric Specialty Exam:  Presentation  General Appearance:  Appropriate for Environment  Eye Contact: Fair  Speech: Clear and Coherent  Speech Volume: Normal  Handedness:No data recorded  Mood and Affect  Mood: Euthymic  Affect: Congruent   Thought Process  Thought Processes: Coherent  Descriptions of Associations:Intact  Orientation:Full (Time, Place and Person)  Thought Content:WDL  History of Schizophrenia/Schizoaffective disorder:No  Duration of Psychotic Symptoms:No data recorded Hallucinations:No data recorded Ideas of Reference:None  Suicidal Thoughts:No data recorded Homicidal Thoughts:No data recorded  Sensorium  Memory: Immediate Fair; Recent Fair  Judgment: Fair  Insight: Fair   Art therapist  Concentration: Good  Attention Span: Good  Recall: Good  Fund of Knowledge: Good  Language: Good   Psychomotor Activity  Psychomotor Activity:No data recorded  Assets  Assets: Desire for Improvement; Leisure Time; Physical Health; Resilience   Sleep  Sleep:No data recorded   Physical Exam: Physical Exam Vitals and nursing note reviewed.  Constitutional:      Appearance: Normal appearance.  HENT:     Head: Normocephalic and atraumatic.     Mouth/Throat:     Pharynx: Oropharynx is clear.  Eyes:     Pupils: Pupils are equal, round, and reactive to light.  Cardiovascular:     Rate and Rhythm: Normal rate and regular rhythm.  Pulmonary:     Effort: Pulmonary effort is normal.     Breath sounds: Normal breath sounds.  Abdominal:     General: Abdomen is flat.     Palpations: Abdomen is soft.  Musculoskeletal:        General: Normal range of motion.  Skin:    General: Skin is warm and dry.  Neurological:     General: No focal deficit present.     Mental Status: He is alert. Mental status is at baseline.  Psychiatric:         Attention and Perception: Attention normal.        Mood and Affect: Mood normal.        Speech: Speech normal.        Behavior: Behavior normal.        Thought Content: Thought content normal.        Cognition and Memory: Cognition normal.        Judgment: Judgment normal.    Review of Systems  Constitutional: Negative.   HENT: Negative.    Eyes: Negative.   Respiratory: Negative.    Cardiovascular: Negative.   Gastrointestinal: Negative.   Musculoskeletal: Negative.   Skin: Negative.   Neurological: Negative.   Psychiatric/Behavioral: Negative.     Blood pressure (!) 118/93, pulse 90, temperature 97.7 F (36.5 C), temperature source Oral, resp. rate 18, height 6\' 1"  (1.854 m), weight 74.8 kg, SpO2 96 %. Body mass index is 21.77 kg/m.  Social History   Tobacco Use  Smoking Status Some Days   Types: Cigarettes  Smokeless Tobacco Never   Tobacco Cessation:  A prescription for an FDA-approved tobacco cessation medication was offered at discharge and the patient refused   Blood Alcohol level:  Lab Results  Component Value Date   ETH <10 10/07/2022   ETH 47 (H) 09/27/2022    Metabolic Disorder Labs:  No results found for: "HGBA1C", "MPG" No results found for: "PROLACTIN" No results found for: "CHOL", "TRIG", "HDL", "CHOLHDL", "VLDL", "LDLCALC"  See Psychiatric Specialty Exam and Suicide Risk Assessment completed by Attending Physician prior to discharge.  Discharge destination:  Home  Is patient on multiple antipsychotic therapies at discharge:  Yes,   Do you recommend tapering to monotherapy for antipsychotics?  No   Has Patient had three or more failed trials of antipsychotic monotherapy by history:  No  Recommended Plan for Multiple Antipsychotic Therapies: Additional reason(s) for multiple antispychotic treatment:  Complex history.  Currently different medicines categorized as antipsychotics are being used for different effect with the chlorpromazine  specifically for his intractable hiccups.  No indication that I can see at this point to change things unless it is to make it work better.  Discharge Instructions     Diet - low sodium heart healthy   Complete by: As directed    Increase activity slowly   Complete by: As directed       Allergies as of 10/16/2022       Reactions   Other Itching, Rash   Peanuts        Medication List     TAKE these medications      Indication  albuterol 108 (90 Base) MCG/ACT inhaler Commonly known as: VENTOLIN HFA Inhale 2 puffs into the lungs every 4 (four) hours as needed for wheezing or shortness of breath.  Indication: Chronic Obstructive Lung Disease   chlorproMAZINE 10 MG tablet Commonly known as: THORAZINE Take 1 tablet (10 mg total) by mouth 3 (three) times daily.  Indication: Hiccups that are Hard to Cure   FLUoxetine 20 MG capsule Commonly known as: PROZAC Take 1 capsule (20 mg total) by mouth daily. Start taking on: Oct 17, 2022 What changed:  medication strength how much to take  Indication: Depression   hydrOXYzine 50 MG tablet Commonly known as: ATARAX Take 1 tablet (50 mg total) by mouth every 6 (six) hours as needed for anxiety.  Indication: Feeling Anxious   levETIRAcetam 500 MG tablet Commonly known as: KEPPRA Take 1 tablet (500 mg total) by mouth 2 (two) times daily.  Indication: Seizure   lisinopril 10 MG tablet Commonly known as: ZESTRIL Take 1 tablet (10 mg total) by mouth daily. Start taking on: Oct 17, 2022  Indication: High Blood Pressure Disorder   risperiDONE 3 MG tablet Commonly known as: RISPERDAL Take 1 tablet (3 mg total) by mouth at bedtime. What changed: You were already taking a medication with the same name, and this prescription was added. Make sure you understand how and when to take each.  Indication: Major Depressive Disorder   risperiDONE 1 MG tablet Commonly known as: RISPERDAL Take 1 tablet (1 mg total) by mouth daily. Start  taking on: Oct 17, 2022 What changed:  medication strength how much to take when to take this  Indication: MIXED BIPOLAR AFFECTIVE DISORDER, Tourette's   traZODone 150 MG tablet Commonly known as: DESYREL Take 1 tablet (150 mg total) by mouth at bedtime. What changed:  medication  strength how much to take when to take this reasons to take this  Indication: Trouble Sleeping         Follow-up recommendations:  Other:  Prescriptions and brief supply of medicine given.  Patient is referred to follow-up with family services of the [place]  Comments: See above  Signed: Mordecai Rasmussen, MD 10/16/2022, 10:11 AM

## 2022-10-16 NOTE — Progress Notes (Addendum)
  [facility] Adult Case Management Discharge Plan :  Will you be returning to the same living situation after discharge:  Yes,  Patient to discharge to place of residence.  At discharge, do you have transportation home?: Yes,  Patient provided with transportation voucher to residence    Patient has signed the [facility] transportation voucher which has been sent to medical records.  Do you have the ability to pay for your medications: Yes,  [employer] private insurance   Release of information consent forms completed and in the chart;  Patient's signature needed at discharge.  Patient to Follow up at:  Follow-up Information     [facility] Go on 10/23/2022.   Why: Please present for scheduled appointment on 5/23 at 1400. Contact information: [address] [place] [address] [phone]                 Next level of care provider has access to [facility] Link:no  Safety Planning and Suicide Prevention discussed: Yes,  SPE completed with patient by nursing staff, declined consent for CSW to reach family/friends to multiple attempts.    Has patient been referred to the Quitline?: Patient refused referral for treatment Tobacco Use: High Risk (10/08/2022)   Patient History    Smoking Tobacco Use: Some Days    Smokeless Tobacco Use: Never    Passive Exposure: Not on file   Patient has been referred for addiction treatment: No known substance use disorder. Social History   Substance and Sexual Activity  Drug Use Not Currently   Social History   Substance and Sexual Activity  Alcohol Use Not Currently  .urine Corky Crafts, LCSWA 10/16/2022, 11:10 AM

## 2022-10-16 NOTE — Group Note (Signed)
Recreation Therapy Group Note   Group Topic:Leisure Education  Group Date: 10/16/2022 Start Time: 1000 End Time: 1055 Facilitators: Rosina Lowenstein, LRT, CTRS Location:  Dayroom  Group Description: Leisure. Patients were given the option to choose from singing karaoke, making origami, using oil pastels, or playing UNO. LRT and pts discussed the meaning of leisure, the importance of participating in leisure during their free time/when they're outside of the hospital, as well as how our leisure interests can also serve as coping skills. Pt identified two leisure interests and shared with the group.   Goal Area(s) Addressed:  Patient will identify a current leisure interest.  Patient will learn the definition of "leisure". Patient will practice making a positive decision. Patient will have the opportunity to try a new leisure activity. Patient will communicate with peers and LRT.   Affect/Mood: Appropriate and Happy   Participation Level: Active and Engaged   Participation Quality: Independent   Behavior: Cooperative and Medical illustrator Process: Coherent   Insight: Good   Judgement: Fair    Modes of Intervention: Activity   Patient Response to Interventions:  Attentive, Engaged, Interested , and Receptive   Education Outcome:  Acknowledges education   Clinical Observations/Individualized Feedback: Raymond [name] was active in their participation of session activities and group discussion. Pt identified "walk and spend time with my brother" as things he enjoys doing in his free time. Pt chose to sing karaoke while in group. Pt interacted well with peers and LRT duration of session.   Plan: Continue to engage patient in RT group sessions 2-3x/week.   Rosina Lowenstein, LRT, CTRS 10/16/2022 11:36 AM

## 2022-10-16 NOTE — BHH Suicide Risk Assessment (Signed)
[facility] Discharge Suicide Risk Assessment   Principal Problem: Traumatic brain injury [facility]) Discharge Diagnoses: Principal Problem:   Traumatic brain injury (HCC) Active Problems:   Seizure disorder (HCC)   Substance induced mood disorder (HCC)   Total Time spent with patient: 30 minutes  Musculoskeletal: Strength & Muscle Tone: within normal limits Gait & Station: normal Patient leans: N/A  Psychiatric Specialty Exam  Presentation  General Appearance:  Appropriate for Environment  Eye Contact: Fair  Speech: Clear and Coherent  Speech Volume: Normal  Handedness:No data recorded  Mood and Affect  Mood: Euthymic  Duration of Depression Symptoms: Greater than two weeks  Affect: Congruent   Thought Process  Thought Processes: Coherent  Descriptions of Associations:Intact  Orientation:Full (Time, Place and Person)  Thought Content:WDL  History of Schizophrenia/Schizoaffective disorder:No  Duration of Psychotic Symptoms:No data recorded Hallucinations:No data recorded Ideas of Reference:None  Suicidal Thoughts:No data recorded Homicidal Thoughts:No data recorded  Sensorium  Memory: Immediate Fair; Recent Fair  Judgment: Fair  Insight: Fair   Art therapist  Concentration: Good  Attention Span: Good  Recall: Good  Fund of Knowledge: Good  Language: Good   Psychomotor Activity  Psychomotor Activity:No data recorded  Assets  Assets: Desire for Improvement; Leisure Time; Physical Health; Resilience   Sleep  Sleep:No data recorded  Physical Exam: Physical Exam Vitals and nursing note reviewed.  Constitutional:      Appearance: Normal appearance.  HENT:     Head: Normocephalic and atraumatic.     Mouth/Throat:     Pharynx: Oropharynx is clear.  Eyes:     Pupils: Pupils are equal, round, and reactive to light.  Cardiovascular:     Rate and Rhythm: Normal rate and regular rhythm.  Pulmonary:     Effort: Pulmonary  effort is normal.     Breath sounds: Normal breath sounds.  Abdominal:     General: Abdomen is flat.     Palpations: Abdomen is soft.  Musculoskeletal:        General: Normal range of motion.  Skin:    General: Skin is warm and dry.  Neurological:     General: No focal deficit present.     Mental Status: He is alert. Mental status is at baseline.  Psychiatric:        Attention and Perception: Attention normal.        Mood and Affect: Mood normal.        Speech: Speech normal.        Behavior: Behavior normal.        Thought Content: Thought content normal.        Cognition and Memory: Cognition normal.        Judgment: Judgment normal.    Review of Systems  Constitutional: Negative.   HENT: Negative.    Eyes: Negative.   Respiratory: Negative.    Cardiovascular: Negative.   Gastrointestinal: Negative.   Musculoskeletal: Negative.   Skin: Negative.   Neurological: Negative.   Psychiatric/Behavioral: Negative.     Blood pressure (!) 118/93, pulse 90, temperature 97.7 F (36.5 C), temperature source Oral, resp. rate 18, height 6\' 1"  (1.854 m), weight 74.8 kg, SpO2 96 %. Body mass index is 21.77 kg/m.  Mental Status Per Nursing Assessment::   On Admission:  NA  Demographic Factors:  Male, Caucasian, Low socioeconomic status, and Living alone  Loss Factors: Decline in physical health and Financial problems/change in socioeconomic status  Historical Factors: Impulsivity  Risk Reduction Factors:   Positive therapeutic relationship  Continued Clinical Symptoms:  Depression:   Comorbid alcohol abuse/dependence Impulsivity  Cognitive Features That Contribute To Risk:  Thought constriction (tunnel vision)    Suicide Risk:  Minimal: No identifiable suicidal ideation.  Patients presenting with no risk factors but with morbid ruminations; may be classified as minimal risk based on the severity of the depressive symptoms    Plan Of Care/Follow-up recommendations:   Other:  Patient appears to be stable and has not shown any self injuring behavior in the hospital.  Does not report any suicidal ideation today.  Chronic anxiety issues continue to be present which can be managed as an outpatient.  Reviewed plan with patient.  Medicines will be provided and he will be referred back to family services at the [place] and has a safe place to stay.  Mordecai Rasmussen, MD 10/16/2022, 10:05 AM

## 2022-10-16 NOTE — Discharge Summary (Signed)
Addendum to previous discharge summary.  Social work advised me that in talking with the patient about discharge the patient had made some comments about hurting himself.  I will reiterate that in my exit interview with the patient and he specifically denied any suicidal ideation.  I will also note that without being so specific as to insinuate malingering that the patient's recent admission was based on a statement of having overdosed on a massive amount of Keppra when the Keppra level turned out to be 0.  Patient appears to have a history of using statements of symptoms that vary from moment to moment and ways to specifically affect outcomes that he prefers.  He has made it clear that he likes being here in the hospital.  At this point I do not think that this need to stop our discharge plan.  He is unlikely to benefit from any further care at this level in the hospital and does have outpatient treatment in place.

## 2022-10-16 NOTE — Progress Notes (Signed)
Discharge Note:  Patient denies SI/HI/AVH at this time. Discharge instructions, AVS, prescriptions, and transition record gone over with patient. Patient agrees to comply with medication management, follow-up visit, and outpatient therapy. Patient belongings returned to patient. Patient questions and concerns addressed and answered. Patient ambulatory off unit. Patient discharged to home.  

## 2022-10-27 ENCOUNTER — Emergency Department (HOSPITAL_COMMUNITY)
Admission: EM | Admit: 2022-10-27 | Discharge: 2022-10-28 | Disposition: A | Discharge status: Other Acute Inpatient Hospital | Payer: BLUE CROSS/BLUE SHIELD | Source: Home / Self Care | Attending: Emergency Medicine | Admitting: Emergency Medicine

## 2022-10-27 ENCOUNTER — Other Ambulatory Visit: Payer: Self-pay

## 2022-10-27 ENCOUNTER — Encounter (HOSPITAL_COMMUNITY): Payer: Self-pay | Admitting: Emergency Medicine

## 2022-10-27 DIAGNOSIS — F332 Major depressive disorder, recurrent severe without psychotic features: Secondary | ICD-10-CM | POA: Insufficient documentation

## 2022-10-27 DIAGNOSIS — R45851 Suicidal ideations: Secondary | ICD-10-CM

## 2022-10-27 DIAGNOSIS — T1491XA Suicide attempt, initial encounter: Secondary | ICD-10-CM | POA: Insufficient documentation

## 2022-10-27 DIAGNOSIS — X838XXA Intentional self-harm by other specified means, initial encounter: Secondary | ICD-10-CM | POA: Insufficient documentation

## 2022-10-27 DIAGNOSIS — S51811A Laceration without foreign body of right forearm, initial encounter: Secondary | ICD-10-CM | POA: Insufficient documentation

## 2022-10-27 DIAGNOSIS — F17210 Nicotine dependence, cigarettes, uncomplicated: Secondary | ICD-10-CM | POA: Insufficient documentation

## 2022-10-27 DIAGNOSIS — Z91010 Allergy to peanuts: Secondary | ICD-10-CM | POA: Insufficient documentation

## 2022-10-27 DIAGNOSIS — F329 Major depressive disorder, single episode, unspecified: Secondary | ICD-10-CM | POA: Diagnosis present

## 2022-10-27 DIAGNOSIS — R21 Rash and other nonspecific skin eruption: Secondary | ICD-10-CM | POA: Insufficient documentation

## 2022-10-27 DIAGNOSIS — T426X2A Poisoning by other antiepileptic and sedative-hypnotic drugs, intentional self-harm, initial encounter: Secondary | ICD-10-CM | POA: Insufficient documentation

## 2022-10-27 DIAGNOSIS — T50912A Poisoning by multiple unspecified drugs, medicaments and biological substances, intentional self-harm, initial encounter: Secondary | ICD-10-CM | POA: Diagnosis present

## 2022-10-27 DIAGNOSIS — T50902A Poisoning by unspecified drugs, medicaments and biological substances, intentional self-harm, initial encounter: Secondary | ICD-10-CM

## 2022-10-27 LAB — COMPREHENSIVE METABOLIC PANEL
ALT: 27 U/L (ref 0–44)
AST: 29 U/L (ref 15–41)
Albumin: 4.7 g/dL (ref 3.5–5.0)
Alkaline Phosphatase: 92 U/L (ref 38–126)
Anion gap: 11 (ref 5–15)
BUN: 10 mg/dL (ref 6–20)
CO2: 19 mmol/L — ABNORMAL LOW (ref 22–32)
Calcium: 9.1 mg/dL (ref 8.9–10.3)
Chloride: 106 mmol/L (ref 98–111)
Creatinine, Ser: 0.7 mg/dL (ref 0.61–1.24)
GFR, Estimated: >60 mL/min (ref >60)
Glucose, Bld: 98 mg/dL (ref 70–99)
Potassium: 3.7 mmol/L (ref 3.5–5.1)
Sodium: 136 mmol/L (ref 135–145)
Total Bilirubin: 1.1 mg/dL (ref 0.3–1.2)
Total Protein: 8.2 g/dL — ABNORMAL HIGH (ref 6.5–8.1)

## 2022-10-27 LAB — CBC
HCT: 45.8 % (ref 39.0–52.0)
Hemoglobin: 16 g/dL (ref 13.0–17.0)
MCH: 31.3 pg (ref 26.0–34.0)
MCHC: 34.9 g/dL (ref 30.0–36.0)
MCV: 89.5 fL (ref 80.0–100.0)
Platelets: 259 10*3/uL (ref 150–400)
RBC: 5.12 MIL/uL (ref 4.22–5.81)
RDW: 13.2 % (ref 11.5–15.5)
WBC: 6.6 10*3/uL (ref 4.0–10.5)
nRBC: 0 % (ref 0.0–0.2)

## 2022-10-27 LAB — URINALYSIS, ROUTINE W REFLEX MICROSCOPIC
Bacteria, UA: NONE SEEN
Bilirubin Urine: NEGATIVE
Glucose, UA: NEGATIVE mg/dL
Ketones, ur: 5 mg/dL — AB
Leukocytes,Ua: NEGATIVE
Nitrite: NEGATIVE
Protein, ur: NEGATIVE mg/dL
Specific Gravity, Urine: 1.005 (ref 1.005–1.030)
pH: 5 (ref 5.0–8.0)

## 2022-10-27 LAB — RAPID URINE DRUG SCREEN, HOSP PERFORMED
Amphetamines: NOT DETECTED
Barbiturates: NOT DETECTED
Benzodiazepines: NOT DETECTED
Cocaine: NOT DETECTED
Opiates: NOT DETECTED
Tetrahydrocannabinol: POSITIVE — AB

## 2022-10-27 LAB — SALICYLATE LEVEL: Salicylate Lvl: <7 mg/dL — ABNORMAL LOW (ref 7.0–30.0)

## 2022-10-27 LAB — ETHANOL: Alcohol, Ethyl (B): <10 mg/dL (ref <10)

## 2022-10-27 LAB — ACETAMINOPHEN LEVEL: Acetaminophen (Tylenol), Serum: <10 ug/mL — ABNORMAL LOW (ref 10–30)

## 2022-10-27 MED ORDER — ALBUTEROL SULFATE HFA 108 (90 BASE) MCG/ACT IN AERS: 2.0000 | INHALATION_SPRAY | RESPIRATORY_TRACT | Status: DC | PRN

## 2022-10-27 MED ORDER — CHLORPROMAZINE HCL 10 MG PO TABS
10.0000 mg | ORAL_TABLET | Freq: Three times a day (TID) | ORAL | Status: DC
  Administered 2022-10-27 – 2022-10-28 (×2): 10 mg via ORAL
  Filled 2022-10-27 (×4): qty 1

## 2022-10-27 MED ORDER — TRAZODONE HCL 100 MG PO TABS
150.0000 mg | ORAL_TABLET | Freq: Every day | ORAL | Status: DC
  Administered 2022-10-27: 150 mg via ORAL
  Filled 2022-10-27: qty 1

## 2022-10-27 MED ORDER — RISPERIDONE 0.5 MG PO TABS
3.0000 mg | ORAL_TABLET | Freq: Every day | ORAL | Status: DC
  Administered 2022-10-27: 3 mg via ORAL
  Filled 2022-10-27: qty 1

## 2022-10-27 MED ORDER — FLUOXETINE HCL 20 MG PO CAPS
20.0000 mg | ORAL_CAPSULE | Freq: Every day | ORAL | Status: DC
  Administered 2022-10-27 – 2022-10-28 (×2): 20 mg via ORAL
  Filled 2022-10-27 (×2): qty 1

## 2022-10-27 MED ORDER — HYDROXYZINE HCL 25 MG PO TABS: 50.0000 mg | ORAL_TABLET | Freq: Four times a day (QID) | ORAL | Status: DC | PRN

## 2022-10-27 MED ORDER — LISINOPRIL 10 MG PO TABS
10.0000 mg | ORAL_TABLET | Freq: Every day | ORAL | Status: DC
  Administered 2022-10-27 – 2022-10-28 (×2): 10 mg via ORAL
  Filled 2022-10-27 (×2): qty 1

## 2022-10-27 NOTE — ED Notes (Signed)
Spoke to Julian from poison control she stated pt is okay to be moved to behavioral health.

## 2022-10-27 NOTE — ED Provider Notes (Signed)
[place] EMERGENCY DEPARTMENT AT [facility] Provider Note   CSN: [id number] Arrival date & time: 10/27/22  1549     History  Chief Complaint  Patient presents with   Drug Overdose   Suicidal    Raymond [name] is a 42 y.o. male.  Patient with a history of anxiety, PTSD, hepatitis C here with intentional overdose of Keppra.  States she intentionally took 7 or 8 tablets of his 500 mg of Keppra around 11 AM and attempted self-harm.  Also did cut his arm intentionally with a razor blade.  Denies any other ingestions.  Neighbor called EMS after doing a wellness check.  States he still feels suicidal wants to hurt himself.  Denies any homicidal thoughts or hallucinations.  No difficulty breathing, chest pain, shortness of breath, abdominal pain, nausea or vomiting.  Denies any other ingestion or drug use.  States compliance with his other medications.  The history is provided by the patient.  Drug Overdose Pertinent negatives include no chest pain, no abdominal pain, no headaches and no shortness of breath.       Home Medications Prior to Admission medications   Medication Sig Start Date End Date Taking? Authorizing Provider  albuterol (VENTOLIN HFA) 108 (90 Base) MCG/ACT inhaler Inhale 2 puffs into the lungs every 4 (four) hours as needed for wheezing or shortness of breath. 10/16/22   Clapacs, Jackquline Denmark, MD  chlorproMAZINE (THORAZINE) 10 MG tablet Take 1 tablet (10 mg total) by mouth 3 (three) times daily. 10/16/22   Clapacs, Jackquline Denmark, MD  FLUoxetine (PROZAC) 20 MG capsule Take 1 capsule (20 mg total) by mouth daily. 10/17/22   Clapacs, Jackquline Denmark, MD  hydrOXYzine (ATARAX) 50 MG tablet Take 1 tablet (50 mg total) by mouth every 6 (six) hours as needed for anxiety. 10/16/22   Clapacs, Jackquline Denmark, MD  levETIRAcetam (KEPPRA) 500 MG tablet Take 1 tablet (500 mg total) by mouth 2 (two) times daily. 10/16/22   Clapacs, Jackquline Denmark, MD  lisinopril (ZESTRIL) 10 MG tablet Take 1 tablet (10 mg total) by  mouth daily. 10/17/22   Clapacs, Jackquline Denmark, MD  risperiDONE (RISPERDAL) 1 MG tablet Take 1 tablet (1 mg total) by mouth daily. 10/17/22   Clapacs, Jackquline Denmark, MD  risperiDONE (RISPERDAL) 3 MG tablet Take 1 tablet (3 mg total) by mouth at bedtime. 10/16/22   Clapacs, Jackquline Denmark, MD  traZODone (DESYREL) 150 MG tablet Take 1 tablet (150 mg total) by mouth at bedtime. 10/16/22   Clapacs, Jackquline Denmark, MD      Allergies    Other    Review of Systems   Review of Systems  Constitutional:  Negative for activity change, appetite change and fever.  HENT:  Negative for congestion and rhinorrhea.   Respiratory:  Negative for cough, chest tightness and shortness of breath.   Cardiovascular:  Negative for chest pain.  Gastrointestinal:  Negative for abdominal pain, nausea and vomiting.  Musculoskeletal:  Negative for arthralgias and myalgias.  Skin:  Negative for rash.  Neurological:  Negative for weakness and headaches.  Psychiatric/Behavioral:  Positive for confusion, self-injury, sleep disturbance and suicidal ideas. Negative for agitation. The patient is nervous/anxious and is hyperactive.    all other systems are negative except as noted in the HPI and PMH.    Physical Exam Updated Vital Signs BP (!) 144/84   Pulse 97   Temp 98.3 F (36.8 C)   Resp 18   Wt 76.2 kg   SpO2 96%  BMI 22.16 kg/m  Physical Exam Vitals and nursing note reviewed.  Constitutional:      General: He is not in acute distress.    Appearance: He is well-developed.  HENT:     Head: Normocephalic and atraumatic.     Mouth/Throat:     Pharynx: No oropharyngeal exudate.  Eyes:     Conjunctiva/sclera: Conjunctivae normal.     Pupils: Pupils are equal, round, and reactive to light.  Neck:     Comments: No meningismus. Cardiovascular:     Rate and Rhythm: Normal rate and regular rhythm.     Heart sounds: Normal heart sounds. No murmur heard. Pulmonary:     Effort: Pulmonary effort is normal. No respiratory distress.     Breath  sounds: Normal breath sounds.  Abdominal:     Palpations: Abdomen is soft.     Tenderness: There is no abdominal tenderness. There is no guarding or rebound.  Musculoskeletal:        General: No tenderness. Normal range of motion.     Cervical back: Normal range of motion and neck supple.  Skin:    General: Skin is warm.     Findings: Erythema and rash present.     Comments: Superficial lacerations right forearm  Neurological:     Mental Status: He is alert and oriented to person, place, and time.     Cranial Nerves: No cranial nerve deficit.     Motor: No abnormal muscle tone.     Coordination: Coordination normal.     Comments: No ataxia on finger to nose bilaterally. No pronator drift. 5/5 strength throughout. CN 2-12 intact.Equal grip strength. Sensation intact.   Psychiatric:        Behavior: Behavior normal.     ED Results / Procedures / Treatments   Labs (all labs ordered are listed, but only abnormal results are displayed) Labs Reviewed  COMPREHENSIVE METABOLIC PANEL - Abnormal; Notable for the following components:      Result Value   CO2 19 (*)    Total Protein 8.2 (*)    All other components within normal limits  SALICYLATE LEVEL - Abnormal; Notable for the following components:   Salicylate Lvl <7.0 (*)    All other components within normal limits  ACETAMINOPHEN LEVEL - Abnormal; Notable for the following components:   Acetaminophen (Tylenol), Serum <10 (*)    All other components within normal limits  ETHANOL  CBC  RAPID URINE DRUG SCREEN, HOSP PERFORMED  URINALYSIS, ROUTINE W REFLEX MICROSCOPIC    EKG EKG Interpretation  Date/Time:  Monday Oct 27 2022 16:00:51 EDT Ventricular Rate:  97 PR Interval:  162 QRS Duration: 99 QT Interval:  342 QTC Calculation: 435 R Axis:   34 Text Interpretation: Sinus rhythm No significant change was found Confirmed by Glynn Octave [phone]) on 10/27/2022 4:33:24 PM  Radiology No results  found.  Procedures Procedures    Medications Ordered in ED Medications - No data to display  ED Course/ Medical Decision Making/ A&P                             Medical Decision Making Amount and/or Complexity of Data Reviewed Labs: ordered. Decision-making details documented in ED Course. Radiology: ordered and independent interpretation performed. Decision-making details documented in ED Course. ECG/medicine tests: ordered and independent interpretation performed. Decision-making details documented in ED Course.   Intentional ingestion of 7 tablets of 500 mg of Keppra and self-harm  attempt.  Vital stable, no distress.  She is calm and cooperative.  Lacerations are superficial and do not need repair   Discussed with poison control.  Recommend 6 to 8-hour observation.  May expect hypotension and somnolence.  EKG is sinus rhythm.  No prolonged QT. Labs show normal anion gap.  Toxicology markers are negative.  Lab work is reassuring.  Patient is medically cleared from Keppra overdose.  Psychiatry to be consulted.  Holding orders placed.         Final Clinical Impression(s) / ED Diagnoses Final diagnoses:  None    Rx / DC Orders ED Discharge Orders     None         Evalie Hargraves, Jeannett Senior, MD 10/27/22 [phone]

## 2022-10-27 NOTE — ED Notes (Signed)
Pt belongings x2 bags placed in TCU Locker 41 d/t belongings were occupying locker 43.

## 2022-10-27 NOTE — ED Triage Notes (Signed)
BIB EMS due to taking 5-6 Keppra pills with intent to harm self. 148/96-102-96% RA- CBG 95

## 2022-10-27 NOTE — ED Notes (Signed)
Pt belongings stored in cabinet "16-18 Resus A" section. Had one earring, shorts, shirt and a pair of shoes. Pt mentioned he had a phone and wallet in the pockets of his shorts.

## 2022-10-27 NOTE — ED Notes (Signed)
Assumed pt care. Pt walked from ED room to [facility] room 42. Pt currently relaxing in bed. A&Ox4, respirations equal and unlabored. Pt currently denies any complaints or pain. Pt denies any current SI/HI, A/VH. Will continue to monitor.

## 2022-10-27 NOTE — Consult Note (Signed)
[facility] ED ASSESSMENT   Reason for Consult:  Psychiatry evaluation Referring Physician:  ER Physician Patient Identification: Raymond [name] MRN:  [id number] ED Chief Complaint: Major depressive disorder, recurrent, severe without psychotic behavior (HCC)  Diagnosis:  Principal Problem:   Major depressive disorder, recurrent, severe without psychotic behavior (HCC) Active Problems:   Suicide attempt [facility])   ED Assessment Time Calculation: Start Time: 1909 Stop Time: 1935 Total Time in Minutes (Assessment Completion): 26   Subjective:   Raymond [name] is a 42 y.o. male patient admitted with previous hx of Depression, anxiety.PTSD and seizure disorder brought to the ER by Ambulance after OD on Keppra.  Patient took 6-8 tablets of 500 mg Keppra.  His Neighbor called the ambulance.  Patient admits that his intention was to die.  HPI:   Records were reviewed and  patient was seen awake but drowsy.  He was Keppra 500 mg.  He added that his intention was to die.  Per ER physician note Patient also cut his arm with a Razor blade.  Patient reports his stressor is missing his mother that died two years ago.  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  Patient lives a lone and states he has no support system.  Patient reports fairly good sleep and good appetite.  Patient was hospitalized at [facility] 5/8-5/16/2024 for OD as well.  Patient reports total of three OD suicide attempts.  Patient denies HI/AVH and no mention of Paranoia.  UDS is positive for Cannabis. Patient is a danger to self with known suicide attempts x3 and most recent early this month.  We will seek inpatient Psychiatry hospitalization.  We will fax out records to facilities with available bed. Past Psychiatric History: previous hx of Depression, anxiety.PTSD and seizure disorder.  Two psychiatry inpatient hospitalization at [facility].  Patient reports three suicide attempts by OD.  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  Risk to  Self or Others: Is the patient at risk to self? Yes Has the patient been a risk to self in the past 6 months? Yes Has the patient been a risk to self within the distant past? Yes Is the patient a risk to others? No Has the patient been a risk to others in the past 6 months? No Has the patient been a risk to others within the distant past? No  [place] Scale:  Flowsheet Row ED from 10/27/2022 in [facility] Emergency Department at [facility] Admission (Discharged) from 10/08/2022 in [facility] INPATIENT BEHAVIORAL MEDICINE ED from 10/07/2022 in [facility] Emergency Department at [facility]  C-SSRS RISK CATEGORY High Risk High Risk High Risk       AIMS:  , , ,  ,   ASAM:    Substance Abuse:     Past Medical History:  Past Medical History:  Diagnosis Date   Anxiety    Hepatitis C    PTSD (post-traumatic stress disorder)     Past Surgical History:  Procedure Laterality Date   FACIAL FRACTURE SURGERY     metal plate under right eye   Family History: No family history on file. Family Psychiatric  History: Not on record Social History:  Social History   Substance and Sexual Activity  Alcohol Use Not Currently     Social History   Substance and Sexual Activity  Drug Use Not Currently    Social History   Socioeconomic History   Marital status: Single    Spouse name: Not  on file   Number of children: Not on file   Years of education: Not on file   Highest education level: Not on file  Occupational History   Not on file  Tobacco Use   Smoking status: Some Days    Types: Cigarettes   Smokeless tobacco: Never  Vaping Use   Vaping Use: Never used  Substance and Sexual Activity   Alcohol use: Not Currently   Drug use: Not Currently   Sexual activity: Not on file  Other Topics Concern   Not on file  Social History Narrative   Not on file   Social Determinants of Health   Financial Resource Strain: Not on file  Food Insecurity: Food Insecurity  Present (10/08/2022)   Hunger Vital Sign    Worried About Running Out of Food in the Last Year: Sometimes true    Ran Out of Food in the Last Year: Sometimes true  Transportation Needs: Patient Declined (10/08/2022)   PRAPARE - Administrator, Civil Service (Medical): Patient declined    Lack of Transportation (Non-Medical): Patient declined  Physical Activity: Not on file  Stress: Not on file  Social Connections: Not on file   Additional Social History:    Allergies:   Allergies  Allergen Reactions   Other Itching and Rash    Peanuts    Labs:  Results for orders placed or performed during the hospital encounter of 10/27/22 (from the past 48 hour(s))  Rapid urine drug screen (hospital performed)     Status: Abnormal   Collection Time: 10/27/22  4:06 PM  Result Value Ref Range   Opiates NONE DETECTED NONE DETECTED   Cocaine NONE DETECTED NONE DETECTED   Benzodiazepines NONE DETECTED NONE DETECTED   Amphetamines NONE DETECTED NONE DETECTED   Tetrahydrocannabinol POSITIVE (A) NONE DETECTED   Barbiturates NONE DETECTED NONE DETECTED    Comment: (NOTE) DRUG SCREEN FOR MEDICAL PURPOSES ONLY.  IF CONFIRMATION IS NEEDED FOR ANY PURPOSE, NOTIFY LAB WITHIN 5 DAYS.  LOWEST DETECTABLE LIMITS FOR URINE DRUG SCREEN Drug Class                     Cutoff (ng/mL) Amphetamine and metabolites    1000 Barbiturate and metabolites    200 Benzodiazepine                 200 Opiates and metabolites        300 Cocaine and metabolites        300 THC                            50 Performed at [facility], [address], [place], [place] [address]   Urinalysis, Routine w reflex microscopic -Urine, Clean Catch     Status: Abnormal   Collection Time: 10/27/22  4:13 PM  Result Value Ref Range   Color, Urine STRAW (A) YELLOW   APPearance CLEAR CLEAR   Specific Gravity, Urine 1.005 1.005 - 1.030   pH 5.0 5.0 - 8.0   Glucose, UA NEGATIVE NEGATIVE mg/dL   Hgb urine  dipstick SMALL (A) NEGATIVE   Bilirubin Urine NEGATIVE NEGATIVE   Ketones, ur 5 (A) NEGATIVE mg/dL   Protein, ur NEGATIVE NEGATIVE mg/dL   Nitrite NEGATIVE NEGATIVE   Leukocytes,Ua NEGATIVE NEGATIVE   RBC / HPF 0-5 0 - 5 RBC/hpf   WBC, UA 0-5 0 - 5 WBC/hpf   Bacteria, UA NONE  SEEN NONE SEEN   Squamous Epithelial / HPF 0-5 0 - 5 /HPF   Mucus PRESENT     Comment: Performed at [facility], [address] [place], [place] [address]  Comprehensive metabolic panel     Status: Abnormal   Collection Time: 10/27/22  4:18 PM  Result Value Ref Range   Sodium 136 135 - 145 mmol/L   Potassium 3.7 3.5 - 5.1 mmol/L   Chloride 106 98 - 111 mmol/L   CO2 19 (L) 22 - 32 mmol/L   Glucose, Bld 98 70 - 99 mg/dL    Comment: Glucose reference range applies only to samples taken after fasting for at least 8 hours.   BUN 10 6 - 20 mg/dL   Creatinine, Ser 6.04 0.61 - 1.24 mg/dL   Calcium 9.1 8.9 - 54.0 mg/dL   Total Protein 8.2 (H) 6.5 - 8.1 g/dL   Albumin 4.7 3.5 - 5.0 g/dL   AST 29 15 - 41 U/L   ALT 27 0 - 44 U/L   Alkaline Phosphatase 92 38 - 126 U/L   Total Bilirubin 1.1 0.3 - 1.2 mg/dL   GFR, Estimated >98 >11 mL/min    Comment: (NOTE) Calculated using the CKD-EPI Creatinine Equation (2021)    Anion gap 11 5 - 15    Comment: Performed at [facility], [address], [place], [place] [address]  Ethanol     Status: None   Collection Time: 10/27/22  4:18 PM  Result Value Ref Range   Alcohol, Ethyl (B) <10 <10 mg/dL    Comment: (NOTE) Lowest detectable limit for serum alcohol is 10 mg/dL.  For medical purposes only. Performed at [facility], [address], [place], [place] [address]   Salicylate level     Status: Abnormal   Collection Time: 10/27/22  4:18 PM  Result Value Ref Range   Salicylate Lvl <7.0 (L) 7.0 - 30.0 mg/dL    Comment: Performed at [facility], [address], [place], [place] [address]   Acetaminophen level     Status: Abnormal   Collection Time: 10/27/22  4:18 PM  Result Value Ref Range   Acetaminophen (Tylenol), Serum <10 (L) 10 - 30 ug/mL    Comment: (NOTE) Therapeutic concentrations vary significantly. A range of 10-30 ug/mL  may be an effective concentration for many patients. However, some  are best treated at concentrations outside of this range. Acetaminophen concentrations >150 ug/mL at 4 hours after ingestion  and >50 ug/mL at 12 hours after ingestion are often associated with  toxic reactions.  Performed at [facility], [address], [place], [place] [address]   cbc     Status: None   Collection Time: 10/27/22  4:18 PM  Result Value Ref Range   WBC 6.6 4.0 - 10.5 K/uL   RBC 5.12 4.22 - 5.81 MIL/uL   Hemoglobin 16.0 13.0 - 17.0 g/dL   HCT 96.2 95.2 - 84.1 %   MCV 89.5 80.0 - 100.0 fL   MCH 31.3 26.0 - 34.0 pg   MCHC 34.9 30.0 - 36.0 g/dL   RDW 32.4 40.1 - 02.7 %   Platelets 259 150 - 400 K/uL   nRBC 0.0 0.0 - 0.2 %    Comment: Performed at [facility], [address] [place], [place] [address]    Current Facility-Administered Medications  Medication Dose Route Frequency Provider Last Rate Last Admin   albuterol (VENTOLIN  HFA) 108 (90 Base) MCG/ACT inhaler 2 puff  2 puff Inhalation Q4H PRN Rancour, Jeannett Senior, MD       chlorproMAZINE (THORAZINE) tablet 10 mg  10 mg Oral TID Rancour, Jeannett Senior, MD       FLUoxetine (PROZAC) capsule 20 mg  20 mg Oral Daily Rancour, Stephen, MD   20 mg at 10/27/22 1857   hydrOXYzine (ATARAX) tablet 50 mg  50 mg Oral Q6H PRN Rancour, Jeannett Senior, MD       lisinopril (ZESTRIL) tablet 10 mg  10 mg Oral Daily Rancour, Stephen, MD   10 mg at 10/27/22 1857   risperiDONE (RISPERDAL) tablet 3 mg  3 mg Oral QHS Rancour, Stephen, MD       traZODone (DESYREL) tablet 150 mg  150 mg Oral QHS Rancour, Stephen, MD       Current Outpatient Medications  Medication Sig Dispense Refill   albuterol  (VENTOLIN HFA) 108 (90 Base) MCG/ACT inhaler Inhale 2 puffs into the lungs every 4 (four) hours as needed for wheezing or shortness of breath. 6.7 g 0   chlorproMAZINE (THORAZINE) 10 MG tablet Take 1 tablet (10 mg total) by mouth 3 (three) times daily. 30 tablet 0   FLUoxetine (PROZAC) 20 MG capsule Take 1 capsule (20 mg total) by mouth daily. 10 capsule 0   hydrOXYzine (ATARAX) 50 MG tablet Take 1 tablet (50 mg total) by mouth every 6 (six) hours as needed for anxiety. 10 tablet 0   levETIRAcetam (KEPPRA) 500 MG tablet Take 1 tablet (500 mg total) by mouth 2 (two) times daily. 20 tablet 0   lisinopril (ZESTRIL) 10 MG tablet Take 1 tablet (10 mg total) by mouth daily. 10 tablet 0   risperiDONE (RISPERDAL) 1 MG tablet Take 1 tablet (1 mg total) by mouth daily. 10 tablet 0   risperiDONE (RISPERDAL) 3 MG tablet Take 1 tablet (3 mg total) by mouth at bedtime. 10 tablet 0   traZODone (DESYREL) 150 MG tablet Take 1 tablet (150 mg total) by mouth at bedtime. 10 tablet 0    Musculoskeletal: Strength & Muscle Tone:  in bed lying down Gait & Station:  in bed lying down Patient leans:  see above   Psychiatric Specialty Exam: Presentation  General Appearance:  Casual  Eye Contact: None  Speech: Clear and Coherent; Normal Rate  Speech Volume: Normal  Handedness: Right   Mood and Affect  Mood: Depressed; Anxious  Affect: Blunt; Depressed   Thought Process  Thought Processes: Coherent; Goal Directed; Linear  Descriptions of Associations:Intact  Orientation:Full (Time, Place and Person)  Thought Content:Logical  History of Schizophrenia/Schizoaffective disorder:No  Duration of Psychotic Symptoms:No data recorded Hallucinations:Hallucinations: None  Ideas of Reference:None  Suicidal Thoughts:Suicidal Thoughts: No  Homicidal Thoughts:Homicidal Thoughts: No   Sensorium  Memory: Immediate Good; Recent Fair; Remote  Fair  Judgment: Impaired  Insight: Fair   Chartered certified accountant: Fair  Attention Span: Fair  Recall: [employer] of Knowledge: Fair  Language: Good   Psychomotor Activity  Psychomotor Activity: Psychomotor Activity: Normal   Assets  Assets: Housing; Social Support; Manufacturing systems engineer; Desire for Improvement    Sleep  Sleep: Sleep: Fair   Physical Exam: Physical Exam Vitals and nursing note reviewed.  Constitutional:      Appearance: He is ill-appearing.  HENT:     Nose: Nose normal.  Cardiovascular:     Rate and Rhythm: Normal rate and regular rhythm.  Pulmonary:     Effort: Pulmonary effort is normal.  Musculoskeletal:  General: Normal range of motion.     Cervical back: Normal range of motion.  Skin:    General: Skin is warm and dry.     Comments: Superficial laceration right forearm  Neurological:     Mental Status: He is oriented to person, place, and time.  Psychiatric:        Attention and Perception: Attention and perception normal.        Mood and Affect: Mood is anxious and depressed.        Speech: Speech is delayed.        Behavior: Behavior normal. Behavior is cooperative.        Thought Content: Thought content includes suicidal ideation.        Cognition and Memory: Cognition and memory normal.        Judgment: Judgment is impulsive and inappropriate.    Review of Systems  Constitutional: Negative.   HENT: Negative.    Eyes: Negative.   Respiratory: Negative.    Cardiovascular: Negative.   Gastrointestinal: Negative.   Genitourinary: Negative.   Musculoskeletal: Negative.   Skin:        Superficial laceration right forearm  Neurological: Negative.   Endo/Heme/Allergies: Negative.   Psychiatric/Behavioral:  Positive for depression and suicidal ideas. The patient is nervous/anxious.    Blood pressure (!) 144/84, pulse 97, temperature 98.3 F (36.8 C), resp. rate 18, weight 76.2 kg, SpO2 96 %. Body  mass index is 22.16 kg/m.  Medical Decision Making: Patient OD on Keppra with the intention of wanting to die.  Earlier this month he did OD.  Patient is a danger to self and meets criteria for inpatient Psychiatry hospitalization.  We will seek bed placement at any facility with available bed.  We will hold off on his home Medications until he is fully awake   Problem 1: Recurrent Major Depressive disorder, severe without Psychoitic features  Problem 2: Suicide attempt  Disposition:  admit, seek bed placement.  Earney Navy, NP-PMHNP-BC 10/27/2022 7:35 PM

## 2022-10-28 ENCOUNTER — Inpatient Hospital Stay (HOSPITAL_COMMUNITY)
Admission: AD | Admit: 2022-10-28 | Discharge: 2022-11-07 | DRG: 885 | Disposition: A | Discharge status: Home / Self Care | Payer: BLUE CROSS/BLUE SHIELD | Source: Intra-hospital | Attending: Psychiatry | Admitting: Psychiatry

## 2022-10-28 ENCOUNTER — Encounter (HOSPITAL_COMMUNITY): Payer: Self-pay | Admitting: Nurse Practitioner

## 2022-10-28 DIAGNOSIS — F4310 Post-traumatic stress disorder, unspecified: Secondary | ICD-10-CM | POA: Diagnosis present

## 2022-10-28 DIAGNOSIS — Z91199 Patient's noncompliance with other medical treatment and regimen due to unspecified reason: Secondary | ICD-10-CM

## 2022-10-28 DIAGNOSIS — T426X2A Poisoning by other antiepileptic and sedative-hypnotic drugs, intentional self-harm, initial encounter: Secondary | ICD-10-CM | POA: Diagnosis present

## 2022-10-28 DIAGNOSIS — F17210 Nicotine dependence, cigarettes, uncomplicated: Secondary | ICD-10-CM | POA: Diagnosis present

## 2022-10-28 DIAGNOSIS — F332 Major depressive disorder, recurrent severe without psychotic features: Principal | ICD-10-CM | POA: Diagnosis present

## 2022-10-28 DIAGNOSIS — Z87820 Personal history of traumatic brain injury: Secondary | ICD-10-CM | POA: Diagnosis not present

## 2022-10-28 DIAGNOSIS — Z560 Unemployment, unspecified: Secondary | ICD-10-CM | POA: Diagnosis not present

## 2022-10-28 DIAGNOSIS — R030 Elevated blood-pressure reading, without diagnosis of hypertension: Secondary | ICD-10-CM | POA: Diagnosis present

## 2022-10-28 DIAGNOSIS — Z9185 Personal history of military service: Secondary | ICD-10-CM

## 2022-10-28 DIAGNOSIS — R451 Restlessness and agitation: Secondary | ICD-10-CM | POA: Diagnosis present

## 2022-10-28 DIAGNOSIS — Z79899 Other long term (current) drug therapy: Secondary | ICD-10-CM | POA: Diagnosis not present

## 2022-10-28 DIAGNOSIS — J449 Chronic obstructive pulmonary disease, unspecified: Secondary | ICD-10-CM | POA: Diagnosis present

## 2022-10-28 DIAGNOSIS — Z9152 Personal history of nonsuicidal self-harm: Secondary | ICD-10-CM | POA: Diagnosis not present

## 2022-10-28 DIAGNOSIS — Z5941 Food insecurity: Secondary | ICD-10-CM

## 2022-10-28 DIAGNOSIS — Z634 Disappearance and death of family member: Secondary | ICD-10-CM | POA: Diagnosis not present

## 2022-10-28 DIAGNOSIS — K5900 Constipation, unspecified: Secondary | ICD-10-CM | POA: Diagnosis present

## 2022-10-28 DIAGNOSIS — F411 Generalized anxiety disorder: Secondary | ICD-10-CM | POA: Diagnosis present

## 2022-10-28 DIAGNOSIS — G40909 Epilepsy, unspecified, not intractable, without status epilepticus: Secondary | ICD-10-CM | POA: Diagnosis present

## 2022-10-28 MED ORDER — ALBUTEROL SULFATE HFA 108 (90 BASE) MCG/ACT IN AERS
2.0000 | INHALATION_SPRAY | RESPIRATORY_TRACT | Status: DC | PRN
  Administered 2022-10-28 – 2022-11-07 (×17): 2 via RESPIRATORY_TRACT
  Filled 2022-10-28: qty 6.7

## 2022-10-28 MED ORDER — LORAZEPAM 1 MG PO TABS: 2.0000 mg | ORAL_TABLET | Freq: Three times a day (TID) | ORAL | Status: DC | PRN

## 2022-10-28 MED ORDER — DIPHENHYDRAMINE HCL 25 MG PO CAPS: 50.0000 mg | ORAL_CAPSULE | Freq: Three times a day (TID) | ORAL | Status: DC | PRN

## 2022-10-28 MED ORDER — ACETAMINOPHEN 325 MG PO TABS: 650.0000 mg | ORAL_TABLET | Freq: Four times a day (QID) | ORAL | Status: DC | PRN

## 2022-10-28 MED ORDER — LISINOPRIL 10 MG PO TABS
10.0000 mg | ORAL_TABLET | Freq: Every day | ORAL | Status: DC
  Administered 2022-10-29 – 2022-11-07 (×10): 10 mg via ORAL
  Filled 2022-10-28 (×12): qty 1

## 2022-10-28 MED ORDER — FLUOXETINE HCL 20 MG PO CAPS
20.0000 mg | ORAL_CAPSULE | Freq: Every day | ORAL | Status: DC
  Administered 2022-10-29: 20 mg via ORAL
  Filled 2022-10-28 (×3): qty 1

## 2022-10-28 MED ORDER — HYDROXYZINE HCL 50 MG PO TABS
50.0000 mg | ORAL_TABLET | Freq: Four times a day (QID) | ORAL | Status: DC | PRN
  Administered 2022-10-28 – 2022-11-04 (×8): 50 mg via ORAL
  Filled 2022-10-28 (×8): qty 1

## 2022-10-28 MED ORDER — MAGNESIUM HYDROXIDE 400 MG/5ML PO SUSP: 30.0000 mL | Freq: Every day | ORAL | Status: DC | PRN

## 2022-10-28 MED ORDER — RISPERIDONE 3 MG PO TABS
3.0000 mg | ORAL_TABLET | Freq: Every day | ORAL | Status: DC
  Administered 2022-10-28: 3 mg via ORAL
  Filled 2022-10-28 (×3): qty 1

## 2022-10-28 MED ORDER — CHLORPROMAZINE HCL 10 MG PO TABS
10.0000 mg | ORAL_TABLET | Freq: Three times a day (TID) | ORAL | Status: DC
  Administered 2022-10-28 – 2022-10-29 (×3): 10 mg via ORAL
  Filled 2022-10-28 (×8): qty 1

## 2022-10-28 MED ORDER — TRAZODONE HCL 150 MG PO TABS
150.0000 mg | ORAL_TABLET | Freq: Every day | ORAL | Status: DC
  Administered 2022-10-28: 150 mg via ORAL
  Filled 2022-10-28 (×3): qty 1

## 2022-10-28 MED ORDER — HALOPERIDOL 5 MG PO TABS: 5.0000 mg | ORAL_TABLET | Freq: Three times a day (TID) | ORAL | Status: DC | PRN

## 2022-10-28 MED ORDER — LEVETIRACETAM 500 MG PO TABS
500.0000 mg | ORAL_TABLET | Freq: Two times a day (BID) | ORAL | Status: DC
  Administered 2022-10-28 – 2022-11-07 (×20): 500 mg via ORAL
  Filled 2022-10-28 (×25): qty 1

## 2022-10-28 MED ORDER — DIPHENHYDRAMINE HCL 50 MG/ML IJ SOLN: 50.0000 mg | Freq: Three times a day (TID) | INTRAMUSCULAR | Status: DC | PRN

## 2022-10-28 MED ORDER — ALBUTEROL SULFATE HFA 108 (90 BASE) MCG/ACT IN AERS: 2.0000 | INHALATION_SPRAY | RESPIRATORY_TRACT | Status: DC | PRN

## 2022-10-28 MED ORDER — HALOPERIDOL LACTATE 5 MG/ML IJ SOLN: 5.0000 mg | Freq: Three times a day (TID) | INTRAMUSCULAR | Status: DC | PRN

## 2022-10-28 MED ORDER — LORAZEPAM 2 MG/ML IJ SOLN: 2.0000 mg | Freq: Three times a day (TID) | INTRAMUSCULAR | Status: DC | PRN

## 2022-10-28 MED ORDER — ALUM & MAG HYDROXIDE-SIMETH 200-200-20 MG/5ML PO SUSP: 30.0000 mL | ORAL | Status: DC | PRN

## 2022-10-28 NOTE — Progress Notes (Signed)
Pt was accepted to [facility] TODAY 10/28/2022, pending signed voluntary consent faxed to [phone]. Bed assignment: 306-1  Pt meets inpatient criteria per Dahlia Byes, NP  Attending Physician will be Phineas Inches, MD  Report can be called to: - Adult unit: [phone]  Pt can arrive after pending items are received; [facility] to coordinate arrival time  Care Team Notified: [facility] Rona Ravens, RN, Dahlia Byes, NP, Feliberto Harts, RN, and [facility], NT  [place], [place]  10/28/2022 10:56 AM

## 2022-10-28 NOTE — Group Note (Signed)
Date:  10/28/2022 Time:  4:38 PM  Group Topic/Focus:  Goals Group:   The focus of this group is to help patients establish daily goals to achieve during treatment and discuss how the patient can incorporate goal setting into their daily lives to aide in recovery. Orientation:   The focus of this group is to educate the patient on the purpose and policies of crisis stabilization and provide a format to answer questions about their admission.  The group details unit policies and expectations of patients while admitted.    Participation Level:  None  Participation Quality:   n/a  Affect:  Flat  Cognitive:   n/a  Insight: None  Engagement in Group:  None  Modes of Intervention:  Discussion, Orientation, and Support  Additional Comments:   Pt attended the Orientation/Goals group.  Edmund Hilda Delray Reza 10/28/2022, 4:38 PM

## 2022-10-28 NOTE — [facility] Group Notes (Signed)
Pt did not attend wrap-up group   

## 2022-10-28 NOTE — Group Note (Signed)
Date:  10/28/2022 Time:  4:57 PM  Group Topic/Focus:  MHA Group/Peer Support/WRAP    Participation Level:  Did Not Attend  Participation Quality:   n/a  Affect:   n/a  Cognitive:   n/a  Insight: None  Engagement in Group:   n/a  Modes of Intervention:   n/a  Additional Comments:   Pt did not attend.  Edmund Hilda Jeiry Birnbaum 10/28/2022, 4:57 PM

## 2022-10-28 NOTE — Group Note (Signed)
Date:  10/28/2022 Time:  5:05 PM  Group Topic/Focus:  MHA Group/Peer Support/WRAP    Participation Level:  Did Not Attend  Participation Quality:   n/a  Affect:   n/a  Cognitive:   n/a  Insight: None  Engagement in Group:   n/a  Modes of Intervention:   n/a  Additional Comments:   Pt did not attend.  Edmund Hilda Khriz Liddy 10/28/2022, 5:05 PM

## 2022-10-28 NOTE — Progress Notes (Signed)
Patient admitted voluntarily to [facility] from [facility]. Patient denies SI, HI, AVH currently. He reports he had a panic attack and that's why he's here. Per report from ED pt took 5-6 keppra pills and has had a previous suicide attempt too. Patient has a history of seizures reporting the last seizure he had was 2 days ago. Patient reports he fell during this last seizure. High fall risk precautions implemented for safety. Patient is allergic to peanuts. He reports tobacco and marijuana use. Denies any other substance abuse. He reports his support system is his brother, dad, and case Production designer, theatre/television/film. He reports he lives alone in an apartment and cannot drive due to his seizures. He denies any trouble getting medications or making appointments. Food and drink given. Patient oriented to the unit. PRN albuterol and vistaril administered. Safety maintained.

## 2022-10-28 NOTE — Group Note (Signed)
Date:  10/28/2022 Time:  3:15 PM  Group Topic/Focus:  Coping With Mental Health Crisis:   The purpose of this group is to help patients identify strategies for coping with mental health crisis.  Group discusses possible causes of crisis and ways to manage them effectively. Emotional Education:   The focus of this group is to discuss what feelings/emotions are, and how they are experienced.    Participation Level:  Did Not Attend  Participation Quality:    Affect:    Cognitive:    Insight:   Engagement in Group:    Modes of Intervention:    Additional Comments:    Rhina Kramme M Kaimen Peine 10/28/2022, 3:15 PM  

## 2022-10-28 NOTE — Tx Team (Signed)
Initial Treatment Plan 10/28/2022 4:48 PM Raymond [name] [id number]:[id number]    PATIENT STRESSORS: Financial difficulties     PATIENT STRENGTHS: Capable of independent living  Communication skills  General fund of knowledge  Motivation for treatment/growth    PATIENT IDENTIFIED PROBLEMS: "Suicidal thoughts"                      DISCHARGE CRITERIA:  Improved stabilization in mood, thinking, and/or behavior Need for constant or close observation no longer present  PRELIMINARY DISCHARGE PLAN: Return to previous living arrangement  PATIENT/FAMILY INVOLVEMENT: This treatment plan has been presented to and reviewed with the patient, Raymond [name].  The patient has been given the opportunity to ask questions and make suggestions.  Edwyna Perfect, RN 10/28/2022, 4:48 PM

## 2022-10-29 ENCOUNTER — Encounter (HOSPITAL_COMMUNITY): Payer: Self-pay

## 2022-10-29 DIAGNOSIS — F332 Major depressive disorder, recurrent severe without psychotic features: Principal | ICD-10-CM

## 2022-10-29 LAB — LIPID PANEL
Cholesterol: 210 mg/dL — ABNORMAL HIGH (ref 0–200)
HDL: 50 mg/dL (ref >40)
LDL Cholesterol: 93 mg/dL (ref 0–99)
Total CHOL/HDL Ratio: 4.2 RATIO
Triglycerides: 337 mg/dL — ABNORMAL HIGH (ref <150)
VLDL: 67 mg/dL — ABNORMAL HIGH (ref 0–40)

## 2022-10-29 MED ORDER — RISPERIDONE 2 MG PO TABS
2.0000 mg | ORAL_TABLET | Freq: Every day | ORAL | Status: DC
  Administered 2022-10-29 – 2022-11-06 (×9): 2 mg via ORAL
  Filled 2022-10-29 (×12): qty 1

## 2022-10-29 MED ORDER — FLUOXETINE HCL 20 MG PO CAPS
40.0000 mg | ORAL_CAPSULE | Freq: Every day | ORAL | Status: DC
  Administered 2022-10-30 – 2022-11-07 (×9): 40 mg via ORAL
  Filled 2022-10-29 (×11): qty 2

## 2022-10-29 MED ORDER — RISPERIDONE 1 MG PO TBDP
1.0000 mg | ORAL_TABLET | Freq: Every day | ORAL | Status: DC
  Administered 2022-10-29 – 2022-11-07 (×10): 1 mg via ORAL
  Filled 2022-10-29 (×13): qty 1

## 2022-10-29 MED ORDER — TRAZODONE HCL 100 MG PO TABS
100.0000 mg | ORAL_TABLET | Freq: Every day | ORAL | Status: DC
  Administered 2022-10-29 – 2022-11-06 (×9): 100 mg via ORAL
  Filled 2022-10-29 (×12): qty 1

## 2022-10-29 NOTE — Group Note (Signed)
Recreation Therapy Group Note   Group Topic:Communication  Group Date: 10/29/2022 Start Time: 0930 End Time: 1000 Facilitators: Samantha Olivera-McCall, LRT,CTRS Location: [address] Dayroom   Goal Area(s) Addresses:  Patient will effectively listen to complete activity.  Patient will identify communication skills used to make activity successful.  Patient will identify how skills used during activity can be used to reach post d/c goals.    Group Description:  Geometric Drawings.  Three volunteers from the peer group will be shown an abstract picture with a particular arrangement of geometrical shapes.  Each round, one 'speaker' will describe the pattern, as accurately as possible without revealing the image to the group.  The remaining group members will listen and draw the picture to reflect how it is described to them. Patients with the role of 'listener' cannot ask clarifying questions but, may request that the speaker repeat a direction. Once the drawings are complete, the presenter will show the rest of the group the picture and compare how close each person came to drawing the picture. LRT will facilitate a post-activity discussion regarding effective communication and the importance of planning, listening, and asking for clarification in daily interactions with others.   Affect/Mood: N/A   Participation Level: Did not attend    Clinical Observations/Individualized Feedback:    Plan: Continue to engage patient in RT group sessions 2-3x/week.   Kandra Graven-McCall, LRT,CTRS 10/29/2022 12:02 PM

## 2022-10-29 NOTE — BH IP Treatment Plan (Signed)
Interdisciplinary Treatment and Diagnostic Plan Update  10/29/2022 Time of Session: 10:50am Raymond [name] MRN: [id number]  Principal Diagnosis: Major depressive disorder, recurrent severe without psychotic features (HCC)  Secondary Diagnoses: Principal Problem:   Major depressive disorder, recurrent severe without psychotic features (HCC)   Current Medications:  Current Facility-Administered Medications  Medication Dose Route Frequency Provider Last Rate Last Admin   acetaminophen (TYLENOL) tablet 650 mg  650 mg Oral Q6H PRN Onuoha, Josephine C, NP       albuterol (VENTOLIN HFA) 108 (90 Base) MCG/ACT inhaler 2 puff  2 puff Inhalation Q4H PRN Dahlia Byes C, NP   2 puff at 10/28/22 1806   alum & mag hydroxide-simeth (MAALOX/MYLANTA) 200-200-20 MG/5ML suspension 30 mL  30 mL Oral Q4H PRN Dahlia Byes C, NP       chlorproMAZINE (THORAZINE) tablet 10 mg  10 mg Oral TID Dahlia Byes C, NP   10 mg at 10/29/22 1138   diphenhydrAMINE (BENADRYL) capsule 50 mg  50 mg Oral TID PRN Earney Navy, NP       Or   diphenhydrAMINE (BENADRYL) injection 50 mg  50 mg Intramuscular TID PRN Earney Navy, NP       FLUoxetine (PROZAC) capsule 20 mg  20 mg Oral Daily Dahlia Byes C, NP   20 mg at 10/29/22 0755   haloperidol (HALDOL) tablet 5 mg  5 mg Oral TID PRN Earney Navy, NP       Or   haloperidol lactate (HALDOL) injection 5 mg  5 mg Intramuscular TID PRN Earney Navy, NP       hydrOXYzine (ATARAX) tablet 50 mg  50 mg Oral Q6H PRN Dahlia Byes C, NP   50 mg at 10/28/22 2050   levETIRAcetam (KEPPRA) tablet 500 mg  500 mg Oral Q12H Massengill, Harrold Donath, MD   500 mg at 10/29/22 0755   lisinopril (ZESTRIL) tablet 10 mg  10 mg Oral Daily Dahlia Byes C, NP   10 mg at 10/29/22 0755   LORazepam (ATIVAN) tablet 2 mg  2 mg Oral TID PRN Earney Navy, NP       Or   LORazepam (ATIVAN) injection 2 mg  2 mg Intramuscular TID PRN Dahlia Byes C, NP        magnesium hydroxide (MILK OF MAGNESIA) suspension 30 mL  30 mL Oral Daily PRN Dahlia Byes C, NP       risperiDONE (RISPERDAL) tablet 3 mg  3 mg Oral QHS Dahlia Byes C, NP   3 mg at 10/28/22 2049   traZODone (DESYREL) tablet 150 mg  150 mg Oral QHS Dahlia Byes C, NP   150 mg at 10/28/22 2050   PTA Medications: Medications Prior to Admission  Medication Sig Dispense Refill Last Dose   albuterol (VENTOLIN HFA) 108 (90 Base) MCG/ACT inhaler Inhale 2 puffs into the lungs every 4 (four) hours as needed for wheezing or shortness of breath. 6.7 g 0 10/28/2022   chlorproMAZINE (THORAZINE) 10 MG tablet Take 1 tablet (10 mg total) by mouth 3 (three) times daily. 30 tablet 0 10/27/2022   FLUoxetine (PROZAC) 20 MG capsule Take 1 capsule (20 mg total) by mouth daily. 10 capsule 0 10/27/2022   hydrOXYzine (ATARAX) 50 MG tablet Take 1 tablet (50 mg total) by mouth every 6 (six) hours as needed for anxiety. 10 tablet 0 10/28/2022   levETIRAcetam (KEPPRA) 500 MG tablet Take 1 tablet (500 mg total) by mouth 2 (two) times daily. 20 tablet 0 2-3  days ago   lisinopril (ZESTRIL) 10 MG tablet Take 1 tablet (10 mg total) by mouth daily. 10 tablet 0 10/28/2022   risperiDONE (RISPERDAL) 1 MG tablet Take 1 tablet (1 mg total) by mouth daily. 10 tablet 0 10/28/2022   risperiDONE (RISPERDAL) 3 MG tablet Take 1 tablet (3 mg total) by mouth at bedtime. 10 tablet 0 10/27/2022   traZODone (DESYREL) 150 MG tablet Take 1 tablet (150 mg total) by mouth at bedtime. 10 tablet 0 10/28/2022    Patient Stressors: Financial difficulties    Patient Strengths: Capable of independent living  Forensic psychologist fund of knowledge  Motivation for treatment/growth   Treatment Modalities: Medication Management, Group therapy, Case management,  1 to 1 session with clinician, Psychoeducation, Recreational therapy.   Physician Treatment Plan for Primary Diagnosis: Major depressive disorder, recurrent severe without  psychotic features (HCC) Long Term Goal(s): Improvement in symptoms so as ready for discharge   Short Term Goals: Ability to identify changes in lifestyle to reduce recurrence of condition will improve Ability to verbalize feelings will improve Ability to disclose and discuss suicidal ideas Ability to demonstrate self-control will improve Ability to identify and develop effective coping behaviors will improve Ability to maintain clinical measurements within normal limits will improve Compliance with prescribed medications will improve Ability to identify triggers associated with substance abuse/mental health issues will improve  Medication Management: Evaluate patient's response, side effects, and tolerance of medication regimen.  Therapeutic Interventions: 1 to 1 sessions, Unit Group sessions and Medication administration.  Evaluation of Outcomes: Progressing  Physician Treatment Plan for Secondary Diagnosis: Principal Problem:   Major depressive disorder, recurrent severe without psychotic features (HCC)  Long Term Goal(s): Improvement in symptoms so as ready for discharge   Short Term Goals: Ability to identify changes in lifestyle to reduce recurrence of condition will improve Ability to verbalize feelings will improve Ability to disclose and discuss suicidal ideas Ability to demonstrate self-control will improve Ability to identify and develop effective coping behaviors will improve Ability to maintain clinical measurements within normal limits will improve Compliance with prescribed medications will improve Ability to identify triggers associated with substance abuse/mental health issues will improve     Medication Management: Evaluate patient's response, side effects, and tolerance of medication regimen.  Therapeutic Interventions: 1 to 1 sessions, Unit Group sessions and Medication administration.  Evaluation of Outcomes: Progressing   RN Treatment Plan for Primary  Diagnosis: Major depressive disorder, recurrent severe without psychotic features (HCC) Long Term Goal(s): Knowledge of disease and therapeutic regimen to maintain health will improve  Short Term Goals: Ability to remain free from injury will improve, Ability to verbalize frustration and anger appropriately will improve, Ability to demonstrate self-control, Ability to participate in decision making will improve, Ability to verbalize feelings will improve, Ability to disclose and discuss suicidal ideas, Ability to identify and develop effective coping behaviors will improve, and Compliance with prescribed medications will improve  Medication Management: RN will administer medications as ordered by provider, will assess and evaluate patient's response and provide education to patient for prescribed medication. RN will report any adverse and/or side effects to prescribing provider.  Therapeutic Interventions: 1 on 1 counseling sessions, Psychoeducation, Medication administration, Evaluate responses to treatment, Monitor vital signs and CBGs as ordered, Perform/monitor CIWA, COWS, AIMS and Fall Risk screenings as ordered, Perform wound care treatments as ordered.  Evaluation of Outcomes: Progressing   LCSW Treatment Plan for Primary Diagnosis: Major depressive disorder, recurrent severe without psychotic features (HCC) Long  Term Goal(s): Safe transition to appropriate next level of care at discharge, Engage patient in therapeutic group addressing interpersonal concerns.  Short Term Goals: Engage patient in aftercare planning with referrals and resources, Increase social support, Increase ability to appropriately verbalize feelings, Increase emotional regulation, Facilitate acceptance of mental health diagnosis and concerns, Facilitate patient progression through stages of change regarding substance use diagnoses and concerns, Identify triggers associated with mental health/substance abuse issues, and  Increase skills for wellness and recovery  Therapeutic Interventions: Assess for all discharge needs, 1 to 1 time with Social worker, Explore available resources and support systems, Assess for adequacy in community support network, Educate family and significant other(s) on suicide prevention, Complete Psychosocial Assessment, Interpersonal group therapy.  Evaluation of Outcomes: Progressing   Progress in Treatment: Attending groups: Yes. Participating in groups: Yes. Taking medication as prescribed: Yes. Toleration medication: Yes. Family/Significant other contact made: No, will contact:  Bradley Ferris friend [phone] Patient understands diagnosis: Yes. Discussing patient identified problems/goals with staff: Yes. Medical problems stabilized or resolved: No. Pt states that he has seizures  Denies suicidal/homicidal ideation: No. Issues/concerns per patient self-inventory: No.   New problem(s) identified: No, Describe:  none reported   New Short Term/Long Term Goal(s): medication stabilization, elimination of SI thoughts, development of comprehensive mental wellness plan.    Patient Goals:  Pt states, " I want to get better from my depression, anxiety, and suicidal thoughts"  Discharge Plan or Barriers: Patient recently admitted. CSW will continue to follow and assess for appropriate referrals and possible discharge planning.    Reason for Continuation of Hospitalization: Anxiety Depression Medication stabilization Suicidal ideation  Estimated Length of Stay: 3-5 days  Last 3 [place] Suicide Severity Risk Score: Flowsheet Row Ad[facility] (Current[facility] from 10/27/2022 in [facility] Department [facility] Admission (Discharged) from 10/08/2022 in [facility]AL MEDICINE  C-SSRS RISK CATEGORY High Risk High Risk High Risk       Last PHQ 2/9 Scores:     No data to display           Scribe for Treatment Team: Beatris Si, LCSW 10/29/2022 2:54 PM

## 2022-10-29 NOTE — Plan of Care (Signed)
  Problem: Education: Goal: Knowledge of [facility] Education information/materials will improve Outcome: Progressing Goal: Emotional status will improve Outcome: Progressing Goal: Mental status will improve Outcome: Progressing Goal: Verbalization of understanding the information provided will improve Outcome: Progressing  Patient compliant with medications denies SI/HI/A/VH and verbally contracts for safety. Interacting well with Games developer. Q 15 minutes safety checks ongoing. Patient remains safe.
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
## 2022-10-29 NOTE — Plan of Care (Signed)
  Problem: Education: Goal: Emotional status will improve Outcome: Not Progressing Goal: Mental status will improve Outcome: Not Progressing   

## 2022-10-29 NOTE — H&P (Cosign Needed Addendum)
Psychiatric Admission Assessment Adult  Patient Identification: Raymond [name] MRN:  [id number] Date of Evaluation:  10/29/2022 Chief Complaint:  Major depressive disorder, recurrent severe without psychotic features (HCC) [F33.2] Principal Diagnosis: Major depressive disorder, recurrent severe without psychotic features (HCC) Diagnosis:  Principal Problem:   Major depressive disorder, recurrent severe without psychotic features (HCC)  CC: " I am here because of my depression, anxiety, panic attacks, seizures activity, and suicidal ideation."  History of Present Illness: Raymond [name] is a 42 year old Caucasian male with prior self diagnosed psychiatric history of anxiety, panic attacks, depression, PTSD, who presents voluntarily to [facility] from [facility] ED after stabilization for worsening depression, anxiety, panic attack, resulting in suicidal ideation by intentional overdosing on 7-8, 500 mg Keppra and self-mutilation.  During this evaluation debit report the following:  " I have history of depression and anxiety for a long time.  It started after I return from the [employer] in 2004.  I developed panic attacks seizures activity, and suicidal ideation after my mom died in [place] in June 42  I moved from [place] to [place] in May 22, 2020 thinking my situation will be better, however it became worse.  On 10/27/2022, I was overwhelmed with depression and anxiety and started having panic attacks.  I grabbed a kitchen knife and started to cut my arms in a suicide attempt.  A neighbor saw me and called EMS to take me to the hospital for evaluation of my mental health."  Patient reports intentionally taking 7 or 8 tablets of his 500 mg of Keppra around 11 AM on 10/27/2022 for suicide attempt.  He also reported superficially self-inflicted wound to his right arm.  He denies any other drug ingestion during this time.  Patient reports previous  inpatient psychiatric hospitalization at [facility] behavioral health in [place] x 1 month for depression, anxiety, panic attack, and suicidal attempt in 2019.  He reports outpatient treatment for his depression and anxiety x 3 months, and has appointment coming up on November 20, 2022.  He reports a history of cocaine and amphetamine use in the past, however he quit using this illegal drugs in 2015.  He attended alcohol and drug rehab in 2019.  However he continues to drink and reports drinking 3, 40 ounces of beer or more daily, smokes 1 pack of cigarette daily, and smokes 1 blunts of marijuana weekly.  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  Patient appears to be inconsistent with providing HPI to this provider, probably due to history of traumatic brain injury as noted in chart review and reported by patient.  Assessment: Patient is seen and examined on [address] sitting up in a chair in the office.  Odyn is alert, oriented to person, place, time, and situation.  He is visible on the unit and was singing an entertaining other patient with the microphone during group activities.  Chart reviewed and findings shared with the treatment team and discussed with the attending psychiatrist.  Mood appears depressed with congruent affect.  Speech is clear with normal volume, however, unusual hiccup-like sound heard throughout the day examination.  Patient referred to this as wheezing.  Encouraged to use his albuterol as ordered.  Thought process and thought content appears logical however with inconsistencies.  He does not appear to  be responding to internal stimuli.  No delusional thinking or paranoia observed during the examination, however patient endorses paranoia and delusional  thinking due to his post war experiences.  He denies SI, HI, or AVH.  He endorses visual hallucination of seeing his deceased mother only when he closes his eyes.  Admission labs reviewed without critical values.  EKG obtained on 10/27/2022: Normal sinus rhythm, ventricular rate of 97, QT/QTc 342/435.  Vital signs within normal limits.  Patient is admitted for mood stabilization, medication management, and safety.  Mode of transport to Hospital: Safe transport Current Outpatient (Home) Medication List: See home medication list PRN medication prior to evaluation: See home medication list ED course: CMP, salicylate level, ethanol level, urine drug screen, and CBC were drawn and interpreted.  EKG was obtained.  Collateral Information: None obtained at this time POA/Legal Guardian: Patient is his own legal guardian  Past Psychiatric Hx: Previous Psych Diagnoses: Major depressive disorder, recurrent severe without psychotic features, amphetamine abuse, cocaine abuse, substance-induced mood disorder, with suicide attempt.  Prior inpatient treatment: Admitted to [facility] behavioral health in [place] in 2020 for suicide attempt for 1 month.  Current/prior outpatient treatment: None, has scheduled appointment on June 2024 at 12 noon with [place] Prior rehab hx: Rehab for alcohol x 1 month in 2019 Psychotherapy hx: Yes History of suicide: X 4 in the last 3 days, another suicide attempt in 2020 History of homicide or aggression: Denies Psychiatric medication history: Thorazine, fluoxetine, hydroxyzine, risperidone, and trazodone. Psychiatric medication compliance history: Noncompliance Neuromodulation history: Denies  Current Psychiatrist: Denies Current therapist: Seeing Graciella Freer at the [place]  Substance Abuse Hx: Alcohol: 3 beers daily of 40 ounces Tobacco: 1 pack of cigarette daily Illicit drugs: Smokes 1 blunts of marijuana weekly Rx drug abuse: Cocaine and meth use in the  past quit drugs use in 2015 Rehab hx: Attended rehab for drug and alcohol use in 2019  Past Medical History: Medical Diagnoses: Seizures activity, lung tumor, high blood pressure Home Rx: Yes Prior Hosp: Yes, in 2019, 2020, and 2024 Prior Surgeries/Trauma: Facial surgery in 2020 Head trauma, LOC, concussions, seizures: History of conclusion while in the [employer].  History of seizures Allergies: Other  Itching, Rash, Other (See Comments) Low  11/13/2021  Peanuts  Tylenol [Acetaminophen]  Rash Low  10/29/2022   LMP: Not applicable Contraception: Not applicable PCP: Denies  Family History: Medical: Medical history of heart disease Psych: Mother has history of depression.  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  Associated Signs/Symptoms: Depression Symptoms:  depressed mood, anhedonia, insomnia, fatigue, feelings of worthlessness/guilt, difficulty concentrating, hopelessness, anxiety, panic attacks, loss of energy/fatigue, disturbed sleep,  (Hypo) Manic Symptoms:  Delusions, Distractibility, Impulsivity, Irritable Mood,  Anxiety Symptoms:  Excessive Worry, Panic Symptoms,  Psychotic Symptoms:  Delusions, Paranoia,  PTSD Symptoms: Had a traumatic exposure:  Patient served in the [employer] from 2001-2004 Re-experiencing:  Flashbacks Intrusive Thoughts Hypervigilance:  Yes Avoidance:  Foreshortened Future  Total Time spent with patient: 45 minutes  Past Psychiatric  History: See above  Is the patient at risk to self? Yes.  Has the patient been a risk to self in the past 6 months? Yes.    Has the patient been a risk to self within the distant past? Yes.    Is the patient a risk to others? No.  Has the patient been a risk to others in the past 6 months? No.  Has the patient been a risk to others within the distant past? No.   [place] Scale:  Flowsheet Row [facility]) [facility] 10/27/2022 in [facility] Admission (Discharged) from 10/08/2022 in [facility] INPATIENT BEH[facility]IORAL MEDICINE  C-SSRS RISK CATEGORY High Risk High Risk High Risk        Prior Inpatient Therapy: Yes.   If yes, describe: At [facility] behavioral health for suicidal attempt Prior Outpatient Therapy: Yes.   If yes, describe: At [place] in [place]  Alcohol Screening: 1. How often do you have a drink containing alcohol?: Never 2. How many drinks containing alcohol do you have on a typical day when you are drinking?: 1 or 2 3. How often do you have six or more drinks on one occasion?: Never AUDIT-C Score: 0 4. How often during the last year have you found that you were not able to stop drinking once you had started?: Never 5. How often during the last year have you failed to do what was normally expected from you because of drinking?: Never 6. How often during the last year have you needed a first drink in the morning to get yourself going after a heavy drinking session?: Never 7. How often during the last year have you had a feeling of guilt of remorse after drinking?: Never 8. How often during the last year have you been unable to remember what happened the night before because you had been drinking?: Never 9. Have you or someone else been injured as a result of your drinking?: No 10. Has a relative or friend or a doctor or another health worker been concerned about  your drinking or suggested you cut down?: No Alcohol Use Disorder Identification Test Final Score (AUDIT): 0  Substance Abuse History in the last 12 months:  Yes.    Consequences of Substance Abuse: Medical Consequences:  Diarrhea, vomiting, blackouts, nausea and dizziness Family Consequences:  Altercation with brothers Blackouts:  Yes DT's: Yes Withdrawal Symptoms:   Cramps Diaphoresis Diarrhea Nausea Tremors Vomiting  Previous Psychotropic Medications: Yes   Psychological Evaluations: Yes   Past Medical History:  Past Medical History:  Diagnosis Date   Anxiety    Hepatitis C    PTSD (post-traumatic stress disorder)     Past Surgical History:  Procedure Laterality Date   FACIAL FRACTURE SURGERY     metal plate under right eye   Family History: Family history of heart disease  Family Psychiatric  History: Mother has history of depression, father was alcoholic and cocaine use, however, stopped drinking and cocaine in 2001.  Tobacco Screening:  Social History   Tobacco Use  Smoking Status Some Days   Types: Cigarettes  Smokeless Tobacco Never    BH Tobacco Counseling     Are you interested in Tobacco Cessation Medications?  No, patient refused Counseled patient on smoking cessation:  N/A, patient does not use tobacco products Reason Tobacco Screening Not Completed: No value filed.       Social History:  Social History   Substance and Sexual Activity  Alcohol Use Not Currently  Social History   Substance and Sexual Activity  Drug Use Not Currently    Additional Social History: Marital status: Single Additional relationship information: reports that he has not been in a relationship in over 9 years Are you sexually active?: No What is your sexual orientation?: heterosexual Has your sexual activity been affected by drugs, alcohol, medication, or emotional stress?: denies Does patient have children?: No   Allergies:   Allergies  Allergen  Reactions   Other Itching, Rash and Other (See Comments)    Peanuts   Tylenol [Acetaminophen] Rash   Lab Results:  Results for orders placed or performed during the hospital encounter of 10/27/22 (from the past 48 hour(s))  Rapid urine drug screen (hospital performed)     Status: Abnormal   Collection Time: 10/27/22  4:06 PM  Result Value Ref Range   Opiates NONE DETECTED NONE DETECTED   Cocaine NONE DETECTED NONE DETECTED   Benzodiazepines NONE DETECTED NONE DETECTED   Amphetamines NONE DETECTED NONE DETECTED   Tetrahydrocannabinol POSITIVE (A) NONE DETECTED   Barbiturates NONE DETECTED NONE DETECTED    Comment: (NOTE) DRUG SCREEN FOR MEDICAL PURPOSES ONLY.  IF CONFIRMATION IS NEEDED FOR ANY PURPOSE, NOTIFY LAB WITHIN 5 DAYS.  LOWEST DETECTABLE LIMITS FOR URINE DRUG SCREEN Drug Class                     Cutoff (ng/mL) Amphetamine and metabolites    1000 Barbiturate and metabolites    200 Benzodiazepine                 200 Opiates and metabolites        300 Cocaine and metabolites        300 THC                            50 Performed at [facility], [address] [place], [place] [address]   Urinalysis, Routine w reflex microscopic -Urine, Clean Catch     Status: Abnormal   Collection Time: 10/27/22  4:13 PM  Result Value Ref Range   Color, Urine STRAW (A) YELLOW   APPearance CLEAR CLEAR   Specific Gravity, Urine 1.005 1.005 - 1.030   pH 5.0 5.0 - 8.0   Glucose, UA NEGATIVE NEGATIVE mg/dL   Hgb urine dipstick SMALL (A) NEGATIVE   Bilirubin Urine NEGATIVE NEGATIVE   Ketones, ur 5 (A) NEGATIVE mg/dL   Protein, ur NEGATIVE NEGATIVE mg/dL   Nitrite NEGATIVE NEGATIVE   Leukocytes,Ua NEGATIVE NEGATIVE   RBC / HPF 0-5 0 - 5 RBC/hpf   WBC, UA 0-5 0 - 5 WBC/hpf   Bacteria, UA NONE SEEN NONE SEEN   Squamous Epithelial / HPF 0-5 0 - 5 /HPF   Mucus PRESENT     Comment: Performed at [facility], [address] [place], [place]  [address]  Comprehensive metabolic panel     Status: Abnormal   Collection Time: 10/27/22  4:18 PM  Result Value Ref Range   Sodium 136 135 - 145 mmol/L   Potassium 3.7 3.5 - 5.1 mmol/L   Chloride 106 98 - 111 mmol/L   CO2 19 (L) 22 - 32 mmol/L   Glucose, Bld 98 70 - 99 mg/dL    Comment: Glucose reference range applies only to samples taken after fasting for at least 8 hours.   BUN 10 6 - 20 mg/dL   Creatinine, Ser 1.47 0.61 -  1.24 mg/dL   Calcium 9.1 8.9 - 16.1 mg/dL   Total Protein 8.2 (H) 6.5 - 8.1 g/dL   Albumin 4.7 3.5 - 5.0 g/dL   AST 29 15 - 41 U/L   ALT 27 0 - 44 U/L   Alkaline Phosphatase 92 38 - 126 U/L   Total Bilirubin 1.1 0.3 - 1.2 mg/dL   GFR, Estimated >09 >60 mL/min    Comment: (NOTE) Calculated using the CKD-EPI Creatinine Equation (2021)    Anion gap 11 5 - 15    Comment: Performed at [facility], [address], [place], [place] [address]  Ethanol     Status: None   Collection Time: 10/27/22  4:18 PM  Result Value Ref Range   Alcohol, Ethyl (B) <10 <10 mg/dL    Comment: (NOTE) Lowest detectable limit for serum alcohol is 10 mg/dL.  For medical purposes only. Performed at [facility], [address], [place], [place] [address]   Salicylate level     Status: Abnormal   Collection Time: 10/27/22  4:18 PM  Result Value Ref Range   Salicylate Lvl <7.0 (L) 7.0 - 30.0 mg/dL    Comment: Performed at [facility], [address], [place], [place] [address]  Acetaminophen level     Status: Abnormal   Collection Time: 10/27/22  4:18 PM  Result Value Ref Range   Acetaminophen (Tylenol), Serum <10 (L) 10 - 30 ug/mL    Comment: (NOTE) Therapeutic concentrations vary significantly. A range of 10-30 ug/mL  may be an effective concentration for many patients. However, some  are best treated at concentrations outside of this range. Acetaminophen concentrations >150 ug/mL at 4 hours after ingestion  and >50 ug/mL  at 12 hours after ingestion are often associated with  toxic reactions.  Performed at [facility], [address], [place], [place] [address]   cbc     Status: None   Collection Time: 10/27/22  4:18 PM  Result Value Ref Range   WBC 6.6 4.0 - 10.5 K/uL   RBC 5.12 4.22 - 5.81 MIL/uL   Hemoglobin 16.0 13.0 - 17.0 g/dL   HCT 08.6 57.8 - 46.9 %   MCV 89.5 80.0 - 100.0 fL   MCH 31.3 26.0 - 34.0 pg   MCHC 34.9 30.0 - 36.0 g/dL   RDW 62.9 52.8 - 41.3 %   Platelets 259 150 - 400 K/uL   nRBC 0.0 0.0 - 0.2 %    Comment: Performed at [facility], [address], [place], [place] [address]    Blood Alcohol level:  Lab Results  Component Value Date   ETH <10 10/27/2022   ETH <10 10/07/2022    Metabolic Disorder Labs:  No results found for: "HGBA1C", "MPG" No results found for: "PROLACTIN" No results found for: "CHOL", "TRIG", "HDL", "CHOLHDL", "VLDL", "LDLCALC"  Current Medications: Current Facility-Administered Medications  Medication Dose Route Frequency Provider Last Rate Last Admin   acetaminophen (TYLENOL) tablet 650 mg  650 mg Oral Q6H PRN Onuoha, Josephine C, NP       albuterol (VENTOLIN HFA) 108 (90 Base) MCG/ACT inhaler 2 puff  2 puff Inhalation Q4H PRN Dahlia Byes C, NP   2 puff at 10/28/22 1806   alum & mag hydroxide-simeth (MAALOX/MYLANTA) 200-200-20 MG/5ML suspension 30 mL  30 mL Oral Q4H PRN Dahlia Byes C, NP       chlorproMAZINE (THORAZINE) tablet 10 mg  10 mg Oral TID  Earney Navy, NP   10 mg at 10/29/22 1138   diphenhydrAMINE (BENADRYL) capsule 50 mg  50 mg Oral TID PRN Earney Navy, NP       Or   diphenhydrAMINE (BENADRYL) injection 50 mg  50 mg Intramuscular TID PRN Earney Navy, NP       FLUoxetine (PROZAC) capsule 20 mg  20 mg Oral Daily Dahlia Byes C, NP   20 mg at 10/29/22 0755   haloperidol (HALDOL) tablet 5 mg  5 mg Oral TID PRN Earney Navy, NP       Or   haloperidol  lactate (HALDOL) injection 5 mg  5 mg Intramuscular TID PRN Earney Navy, NP       hydrOXYzine (ATARAX) tablet 50 mg  50 mg Oral Q6H PRN Dahlia Byes C, NP   50 mg at 10/28/22 2050   levETIRAcetam (KEPPRA) tablet 500 mg  500 mg Oral Q12H Massengill, Harrold Donath, MD   500 mg at 10/29/22 0755   lisinopril (ZESTRIL) tablet 10 mg  10 mg Oral Daily Dahlia Byes C, NP   10 mg at 10/29/22 0755   LORazepam (ATIVAN) tablet 2 mg  2 mg Oral TID PRN Earney Navy, NP       Or   LORazepam (ATIVAN) injection 2 mg  2 mg Intramuscular TID PRN Dahlia Byes C, NP       magnesium hydroxide (MILK OF MAGNESIA) suspension 30 mL  30 mL Oral Daily PRN Dahlia Byes C, NP       risperiDONE (RISPERDAL) tablet 3 mg  3 mg Oral QHS Dahlia Byes C, NP   3 mg at 10/28/22 2049   traZODone (DESYREL) tablet 150 mg  150 mg Oral QHS Dahlia Byes C, NP   150 mg at 10/28/22 2050   PTA Medications: Medications Prior to Admission  Medication Sig Dispense Refill Last Dose   albuterol (VENTOLIN HFA) 108 (90 Base) MCG/ACT inhaler Inhale 2 puffs into the lungs every 4 (four) hours as needed for wheezing or shortness of breath. 6.7 g 0 10/28/2022   chlorproMAZINE (THORAZINE) 10 MG tablet Take 1 tablet (10 mg total) by mouth 3 (three) times daily. 30 tablet 0 10/27/2022   FLUoxetine (PROZAC) 20 MG capsule Take 1 capsule (20 mg total) by mouth daily. 10 capsule 0 10/27/2022   hydrOXYzine (ATARAX) 50 MG tablet Take 1 tablet (50 mg total) by mouth every 6 (six) hours as needed for anxiety. 10 tablet 0 10/28/2022   levETIRAcetam (KEPPRA) 500 MG tablet Take 1 tablet (500 mg total) by mouth 2 (two) times daily. 20 tablet 0 2-3 days ago   lisinopril (ZESTRIL) 10 MG tablet Take 1 tablet (10 mg total) by mouth daily. 10 tablet 0 10/28/2022   risperiDONE (RISPERDAL) 1 MG tablet Take 1 tablet (1 mg total) by mouth daily. 10 tablet 0 10/28/2022   risperiDONE (RISPERDAL) 3 MG tablet Take 1 tablet (3 mg total) by mouth at  bedtime. 10 tablet 0 10/27/2022   traZODone (DESYREL) 150 MG tablet Take 1 tablet (150 mg total) by mouth at bedtime. 10 tablet 0 10/28/2022   Musculoskeletal: Strength & Muscle Tone: within normal limits Gait & Station: normal Patient leans: N/A  Psychiatric Specialty Exam:  Presentation  General Appearance:  Casual  Eye Contact: Fair  Speech: Clear and Coherent; Normal Rate  Speech Volume: Normal  Handedness: Right  Mood and Affect  Mood: Anxious; Depressed  Affect: Depressed; Congruent  Thought Process  Thought Processes: Coherent;  Goal Directed; Linear  Duration of Psychotic Symptoms: 3 years ago  Past Diagnosis of Schizophrenia or Psychoactive disorder: No  Descriptions of Associations:Intact  Orientation:Full (Time, Place and Person)  Thought Content:Logical; Obsessions  Hallucinations:Hallucinations: Visual Description of Visual Hallucinations: Reports seeing his deceased mother occasionally when he closes his eyes.  Ideas of Reference:None  Suicidal Thoughts:Suicidal Thoughts: No  Homicidal Thoughts:Homicidal Thoughts: No  Sensorium  Memory: Immediate Fair; Recent Fair  Judgment: Impaired  Insight: Fair  Chartered certified accountant: Fair  Attention Span: Fair  Recall: Fair  Fund of Knowledge: Fair  Language: Good  Psychomotor Activity  Psychomotor Activity: Psychomotor Activity: Normal  Assets  Assets: Communication Skills; Desire for Improvement; Housing; Physical Health; Resilience  Sleep  Sleep: Sleep: Good Number of Hours of Sleep: 5  Physical Exam: Physical Exam Vitals and nursing note reviewed.  HENT:     Head: Normocephalic.     Nose: Nose normal.     Mouth/Throat:     Mouth: Mucous membranes are moist.     Pharynx: Oropharynx is clear.  Eyes:     Pupils: Pupils are equal, round, and reactive to light.  Cardiovascular:     Rate and Rhythm: Normal rate.     Pulses: Normal pulses.   Pulmonary:     Effort: Pulmonary effort is normal.  Abdominal:     Comments: Deferred  Genitourinary:    Comments: Deferred  Musculoskeletal:        General: Normal range of motion.     Cervical back: Normal range of motion.  Skin:    General: Skin is warm.  Neurological:     General: No focal deficit present.     Mental Status: He is alert and oriented to person, place, and time.  Psychiatric:        Behavior: Behavior normal.    Review of Systems  Constitutional:  Negative for fever.  HENT:  Negative for hearing loss.   Eyes:  Negative for blurred vision.  Respiratory:  Negative for shortness of breath.   Cardiovascular: Negative.   Gastrointestinal:  Negative for nausea and vomiting.  Genitourinary: Negative.   Musculoskeletal: Negative.   Skin:  Negative for itching and rash.  Neurological:  Positive for seizures (History of seizures.  Last seizure yesterday as reported by patient.). Negative for dizziness, tingling, tremors and headaches.  Psychiatric/Behavioral:  Positive for depression. The patient is nervous/anxious and has insomnia.    Blood pressure 125/85, pulse 70, temperature 97.6 F (36.4 C), temperature source Oral, resp. rate 18, height 6\' 1"  (1.854 m), weight 83 kg, SpO2 97 %. Body mass index is 24.14 kg/m.  Treatment Plan Summary: Daily contact with patient to assess and evaluate symptoms and progress in treatment and Medication management  Observation Level/Precautions:  15 minute checks  Laboratory:  CBC Chemistry Profile HbAIC UDS UA  Psychotherapy: Therapeutic milieu  Medications: See MAR  Consultations: Social work  Discharge Concerns: Safety  Estimated LOS: 5 to 7 days  Other:  n/a   Physician Treatment Plan for Primary Diagnosis:  Assessment: Major depressive disorder, recurrent severe without psychotic features (HCC) Generalized anxiety disorder PTSD Seizure disorder  Plan: Medications: Continue Prozac 20 mg p.o. daily for  depression, may titrate to 40 mg p.o. starting tomorrow 10/30/2022 Continue hydroxyzine 50 mg p.o. every 6 hours as needed for anxiety Continue Risperdal 1 mg p.o. daily for mood stabilization Continue Risperdal 2 mg p.o. at bedtime for mood stabilization Continue trazodone 100 mg p.o. at bedtime for insomnia  Other medical problems: Albuterol Ventolin HFA 108 90 base MCG/ACT inhaler 2 puffs into the lungs every 4 hours as needed for wheezing or shortness of breath Keppra 500 mg tablet 1 p.o. 2 times daily for seizures activity Lisinopril 10 mg tablet by mouth daily for high blood pressure  Agitation protocol: Benadryl capsule 50 mg p.o. 3 times daily as needed agitation or Benadryl injection 50 mg IM 3 times daily as needed agitation   Haldol tablets 5 mg po 3 times daily as needed agitation or Haldol lactate injection 5 mg IM 3 times daily as needed agitation   Lorazepam tablet 2 mg p.o. 3 times daily as needed agitation or Lorazepam injection 2 mg IM 3 times daily as needed agitation   Other PRN Medications -Acetaminophen 650 mg every 6 as needed/mild pain -Maalox 30 mL oral every 4 as needed/digestion -Magnesium hydroxide 30 mL daily as needed/mild constipation --Nicotine gum 2 mg oral for smoking cessation  -- The risks/benefits/side-effects/alternatives to this medication were discussed in detail with the patient and time was given for questions. The patient consents to medication trial.              -- Encouraged patient to participate  in unit milieu and in scheduled group therapies    Safety and Monitoring: Voluntary admission to inpatient psychiatric unit for safety, stabilization and treatment Daily contact with patient to assess and evaluate symptoms and progress in treatment Patient's case to be discussed in multi-disciplinary team meeting Observation Level : q15 minute checks Vital signs: q12 hours Precautions: suicide, but pt currently verbally contracts for safety  on unit    Discharge Planning: Social work and case management to assist with discharge planning and identification of hospital follow-up needs prior to discharge Estimated LOS: 5-7 days Discharge Concerns: Need to establish a safety plan; Medication compliance and effectiveness Discharge Goals: Return home with outpatient referrals for mental health follow-up including medication management/psychotherapy.   Long Term Goal(s): Improvement in symptoms so as ready for discharge  Short Term Goals: Ability to identify changes in lifestyle to reduce recurrence of condition will improve, Ability to verbalize feelings will improve, Ability to disclose and discuss suicidal ideas, Ability to demonstrate self-control will improve, Ability to identify and develop effective coping behaviors will improve, Ability to maintain clinical measurements within normal limits will improve, Compliance with prescribed medications will improve, and Ability to identify triggers associated with substance abuse/mental health issues will improve  Physician Treatment Plan for Secondary Diagnosis: Principal Problem:   Major depressive disorder, recurrent severe without psychotic features (HCC)  I certify that inpatient services furnished can reasonably be expected to improve the patient's condition.    Cecilie Lowers, FNP 5/29/20242:26 PM

## 2022-10-29 NOTE — Progress Notes (Signed)
Psychoeducational Group Note  Date:  10/29/2022 Time:  2143  Group Topic/Focus:  Relapse Prevention Planning:   The focus of this group is to define relapse and discuss the need for planning to combat relapse.  Participation Level: Did Not Attend  Participation Quality:  Not Applicable  Affect:  Not Applicable  Cognitive:  Not Applicable  Insight:  Not Applicable  Engagement in Group: Not Applicable  Additional Comments:  The patient walked out of the dayroom before the group commenced.   Westly Pam 10/29/2022, 9:46 PM

## 2022-10-29 NOTE — Group Note (Signed)
Date:  10/29/2022 Time:  10:52 AM  Group Topic/Focus:  Goals Group:   The focus of this group is to help patients establish daily goals to achieve during treatment and discuss how the patient can incorporate goal setting into their daily lives to aide in recovery. Orientation:   The focus of this group is to educate the patient on the purpose and policies of crisis stabilization and provide a format to answer questions about their admission.  The group details unit policies and expectations of patients while admitted.    Participation Level:  Active  Participation Quality:  Attentive  Affect:  Appropriate  Cognitive:  Appropriate  Insight: Appropriate  Engagement in Group:  Engaged  Modes of Intervention:  Discussion  Additional Comments:  Patient attended group and was attentive the duration of it. Patient's goal was to stay positive the entire day.   Naamah Boggess T Lorraine Lax 10/29/2022, 10:52 AM

## 2022-10-29 NOTE — Progress Notes (Signed)
Pt denied SI/HI/AVH this morning. Pt rated his depression a 4/10, anxiety a 5/10, and feelings of hopelessness a 5/10. Pt reports he slept "fair" last night. This morning patient reports; "I had a seizure in my bed last night, no one knew". Patient preoccupied with seizures and seizure medication throughout the day. Pt has been cooperative throughout the shift. RN provided support and encouragement to patient. Pt given scheduled medications as prescribed. Q15 min checks verified for safety. Patient verbally contracts for safety. Patient compliant with medications and treatment plan. Patient is interacting well on the unit. Pt is safe on the unit.   10/29/22 1000  Psych Admission Type (Psych Patients Only)  Admission Status Voluntary  Psychosocial Assessment  Patient Complaints Worrying;Other (Comment) ("Had a seizure last night")  Eye Contact Fair  Facial Expression Animated  Affect Apprehensive  Child psychotherapist Assertive  Motor Activity Fidgety  Appearance/Hygiene Unremarkable  Behavior Characteristics Cooperative  Mood Anxious;Pleasant  Thought Process  Coherency Tangential  Content Preoccupation  Delusions None reported or observed  Perception WDL  Hallucination None reported or observed  Judgment Poor  Confusion None  Danger to Self  Current suicidal ideation? Denies  Agreement Not to Harm Self Yes  Description of Agreement Pt verbally contracts for safety  Danger to Others  Danger to Others None reported or observed

## 2022-10-29 NOTE — [facility] Group Notes (Signed)
Date:  10/29/2022 Time:  11:54 AM  Group Topic/Focus:  Wellness Toolbox:   The focus of this group is to discuss various aspects of wellness, balancing those aspects and exploring ways to increase the ability to experience wellness.  Patients will create a wellness toolbox for use upon discharge.  Participation Level:  Active  Participation Quality:  Attentive  Affect:  Appropriate  Cognitive:  Appropriate  Insight: Appropriate  Engagement in Group:  Engaged  Modes of Intervention:  Exploration  Additional Comments:  Pt attended group. Pt was able to reflect on activity and add new coping skill to welllness toolbox.   Daniil Labarge S Ndidi Nesby 10/29/2022, 11:54 AM 

## 2022-10-29 NOTE — Progress Notes (Signed)
   10/29/22 2101  Psych Admission Type (Psych Patients Only)  Admission Status Voluntary  Psychosocial Assessment  Patient Complaints Anxiety;Restlessness;Worrying  Eye Contact Brief  Facial Expression Anxious;Worried  Affect Apprehensive  Holiday representative Activity Fidgety;Pacing  Appearance/Hygiene Unremarkable  Behavior Characteristics Restless  Mood Depressed;Anxious;Irritable  Thought Process  Coherency Tangential  Content Preoccupation  Delusions None reported or observed  Perception WDL  Hallucination None reported or observed  Judgment Poor  Confusion None  Danger to Self  Current suicidal ideation? Passive  Self-Injurious Behavior Some self-injurious ideation observed or expressed.  No lethal plan expressed   Agreement Not to Harm Self Yes  Description of Agreement verbal  Danger to Others  Danger to Others None reported or observed    On assessment, patient presents with high anxiety and irritability.  Patient reports, "I don't like this place, I wanted to go to [facility] provides emotional support for patient.  Patient endorses Passive SI and denies HI/AVH/pain.  Patient contracts for safety.  Administered PRN Hydroxyzine per [facility] per patient request.  Patient is safe on the unit with q 15 safety checks.

## 2022-10-30 DIAGNOSIS — F332 Major depressive disorder, recurrent severe without psychotic features: Secondary | ICD-10-CM | POA: Diagnosis not present

## 2022-10-30 LAB — HEMOGLOBIN A1C
Hgb A1c MFr Bld: 6.1 % — ABNORMAL HIGH (ref 4.8–5.6)
Mean Plasma Glucose: 128 mg/dL

## 2022-10-30 NOTE — Progress Notes (Signed)
   10/30/22 0548  15 Minute Checks  Location Bedroom  Visual Appearance Calm  Behavior Sleeping  Sleep (Behavioral Health Patients Only)  Calculate sleep? (Click Yes once per 24 hr at 0600 safety check) Yes  Documented sleep last 24 hours 8.75

## 2022-10-30 NOTE — Progress Notes (Signed)
Report given to Doug RN

## 2022-10-30 NOTE — [facility] Group Notes (Signed)
[facility] Group Notes:  (Nursing/MHT/Case Management/Adjunct)  Date:  10/30/2022  Time:  2000  Type of Therapy:   wrap up group  Participation Level:  Active  Participation Quality:  Appropriate, Attentive, Sharing, and Supportive  Affect:  Appropriate  Cognitive:  Appropriate  Insight:  Improving  Engagement in Group:  Engaged  Modes of Intervention:  Clarification, Education, and Support  Summary of Progress/Problems: Positive thinking and positive change were discussed.   Raymond [name] 10/30/2022, 9:36 PM

## 2022-10-30 NOTE — Progress Notes (Deleted)
   10/30/22 2300  Charting Type  Charting Type Shift assessment  Safety Check Verification  Has the RN verified the 15 minute safety check completion? Yes  HEENT  HEENT (WDL) WDL  Respiratory  Respiratory (WDL) WDL  Neurological  Level of Consciousness Alert

## 2022-10-30 NOTE — Group Note (Signed)
Date:  10/30/2022 Time:  10:21 AM  Group Topic/Focus:  Goals Group:   The focus of this group is to help patients establish daily goals to achieve during treatment and discuss how the patient can incorporate goal setting into their daily lives to aide in recovery. Orientation:   The focus of this group is to educate the patient on the purpose and policies of crisis stabilization and provide a format to answer questions about their admission.  The group details unit policies and expectations of patients while admitted.    Participation Level:  Active  Participation Quality:  Attentive  Affect:  Appropriate  Cognitive:  Appropriate  Insight: Appropriate  Engagement in Group:  Engaged  Modes of Intervention:  Discussion  Additional Comments:   Patient attended group and was attentive the duration of it. Patient's goal was to sing more and read.   Raymond [name] 10/30/2022, 10:21 AM

## 2022-10-30 NOTE — Group Note (Signed)
Date:  10/30/2022 Time:  12:36 PM  Group Topic/Focus:  Identifying Needs:   The focus of this group is to help patients identify their personal needs that have been historically problematic and identify healthy behaviors to address their needs. Managing Feelings:   The focus of this group is to identify what feelings patients have difficulty handling and develop a plan to handle them in a healthier way upon discharge. Overcoming Stress:   The focus of this group is to define stress and help patients assess their triggers.    Participation Level:  Active  Participation Quality:  Appropriate  Affect:  Appropriate  Cognitive:  Appropriate  Insight: Appropriate  Engagement in Group:  Engaged  Modes of Intervention:  Clarification, Discussion, Exploration, and Support  Additional Comments:    Memory Dance Raymond [name] 10/30/2022, 12:36 PM

## 2022-10-30 NOTE — Progress Notes (Signed)
[facility] MD Progress Note  10/30/2022 6:34 PM Raymond [name]  MRN:  [id number]  Principal Problem: Major depressive disorder, recurrent severe without psychotic features (HCC) Diagnosis: Principal Problem:   Major depressive disorder, recurrent severe without psychotic features [facility])  Reason for admission:  Raymond [name] is a 42 year old Caucasian male with prior self diagnosed psychiatric history of anxiety, panic attacks, depression, PTSD, who presents voluntarily to [facility] from [facility] ED after stabilization for worsening depression, anxiety, panic attack, resulting in suicidal ideation by intentional overdosing on 7-8, 500 mg Keppra and self-mutilation.   24-hour chart Review: Past 24 hours of patient's chart was reviewed.  Patient is compliant with scheduled meds. Required Agitation PRNs: none As needed medications: Hydroxyzine 25 mg p.o. administered at 2101 yesterday for anxiety Per RN notes, no documented behavioral issues and is attending group. Patient slept, 7.5 hours.    Today's assessment notes: Patient seen and examined in his room lying on his bed.  He presents alert and oriented to person, place, time and situation.  Mood appears less depressed, however patient rates depression as #7/10 with 10 being highest severity.  He rates anxiety as #6/10 with 10 being highest severity.  Encouraged to obtain as needed for anxiety from nursing staff.  He continues on his scheduled antidepressant of Prozac 40 mg p.o. daily and Risperdal 1 mg p.o. in a.m. and 2 mg p.o. at bedtime for mood stabilization.  Denies somatic discomfort.  Encouraged patient to take a bath today for improved hygiene.  Observe patient later in the daytime participating in therapeutic milieu and group activities.  Denies acute discomfort.  Going to the cafeteria with the patient and eating all his meals.  He denies SI, HI or AVH.  Mood appears pleasant and brighter later in the afternoon.   Will continue current treatment plan as indicated below.  Total Time spent with patient: 30 minutes  Past Psychiatric History: Previous Psych Diagnoses: Major depressive disorder, recurrent severe without psychotic features, amphetamine abuse, cocaine abuse, substance-induced mood disorder, with suicide attempt.  Prior inpatient treatment: Admitted to [facility] behavioral health in [place] in 2020 for suicide attempt for 1 month.  Current/prior outpatient treatment: None, has scheduled appointment on June 2024 at 12 noon with Raymond [name] Prior rehab hx: Rehab for alcohol x 1 month in 2019 Psychotherapy hx: Yes History of suicide: X 4 in the last 3 days, another suicide attempt in 2020 History of homicide or aggression: Denies Psychiatric medication history: Thorazine, fluoxetine, hydroxyzine, risperidone, and trazodone. Psychiatric medication compliance history: Noncompliance Neuromodulation history: Denies  Current Psychiatrist: Denies Current therapist: Seeing Raymond [name] at the [name]  Past Medical History:  Past Medical History:  Diagnosis Date   Anxiety    Hepatitis [name]    PTSD (post-traumatic stress disorder)     Past Surgical History:  Procedure Laterality Date   FACIAL FRACTURE SURGERY     metal plate under right eye   Family History: History reviewed. No pertinent family history. Family Psychiatric  History: HPI Social History:  Social History   Substance and Sexual Activity  Alcohol Use Not Currently     Social History   Substance and Sexual Activity  Drug Use Not Currently    Social History   Socioeconomic History   Marital status: Single    Spouse name: Not on file   Number of children: Not on file   Years of education: Not on file   Highest education level: Not on file  Occupational History   Not on file  Tobacco Use   Smoking status: Some Days    Types: Cigarettes   Smokeless tobacco: Never  Vaping Use   Vaping Use: Never used   Substance and Sexual Activity   Alcohol use: Not Currently   Drug use: Not Currently   Sexual activity: Not on file  Other Topics Concern   Not on file  Social History Narrative   Not on file   Social Determinants of Health   Financial Resource Strain: Not on file  Food Insecurity: Food Insecurity Present (10/28/2022)   Hunger Vital Sign    Worried About Running Out of Food in the Last Year: Sometimes true    Ran Out of Food in the Last Year: Sometimes true  Transportation Needs: No Transportation Needs (10/28/2022)   PRAPARE - Administrator, Civil Service (Medical): No    Lack of Transportation (Non-Medical): No  Physical Activity: Not on file  Stress: Not on file  Social Connections: Not on file   Additional Social History:    Sleep: Good  Appetite:  Good  Current Medications: Current Facility-Administered Medications  Medication Dose Route Frequency Provider Last Rate Last Admin   acetaminophen (TYLENOL) tablet 650 mg  650 mg Oral Q6H PRN Raymond, Josephine C, NP       albuterol (VENTOLIN HFA) 108 (90 Base) MCG/ACT inhaler 2 puff  2 puff Inhalation Q4H PRN Raymond Byes C, NP   2 puff at 10/29/22 1809   alum & mag hydroxide-simeth (MAALOX/MYLANTA) 200-200-20 MG/5ML suspension 30 mL  30 mL Oral Q4H PRN Raymond Byes C, NP       diphenhydrAMINE (BENADRYL) capsule 50 mg  50 mg Oral TID PRN Raymond Byes C, NP       Or   diphenhydrAMINE (BENADRYL) injection 50 mg  50 mg Intramuscular TID PRN Raymond Navy, NP       FLUoxetine (PROZAC) capsule 40 mg  40 mg Oral Daily Raymond [name], Raymond Oka, FNP   40 mg at 10/30/22 1610   haloperidol (HALDOL) tablet 5 mg  5 mg Oral TID PRN Raymond Navy, NP       Or   haloperidol lactate (HALDOL) injection 5 mg  5 mg Intramuscular TID PRN Raymond Navy, NP       hydrOXYzine (ATARAX) tablet 50 mg  50 mg Oral Q6H PRN Raymond Byes C, NP   50 mg at 10/29/22 2101   levETIRAcetam (KEPPRA) tablet 500 mg  500 mg  Oral Q12H Raymond, Harrold Donath, MD   500 mg at 10/30/22 0929   lisinopril (ZESTRIL) tablet 10 mg  10 mg Oral Daily Raymond Byes C, NP   10 mg at 10/30/22 9604   LORazepam (ATIVAN) tablet 2 mg  2 mg Oral TID PRN Raymond Navy, NP       Or   LORazepam (ATIVAN) injection 2 mg  2 mg Intramuscular TID PRN Raymond Byes C, NP       magnesium hydroxide (MILK OF MAGNESIA) suspension 30 mL  30 mL Oral Daily PRN Raymond Byes C, NP       risperiDONE (RISPERDAL M-TABS) disintegrating tablet 1 mg  1 mg Oral Daily Sulma Ruffino C, FNP   1 mg at 10/30/22 5409   risperiDONE (RISPERDAL) tablet 2 mg  2 mg Oral QHS Kawana Hegel C, FNP   2 mg at 10/29/22 2101   traZODone (DESYREL) tablet 100 mg  100 mg Oral QHS Aristides Luckey, Raymond Oka, FNP  100 mg at 10/29/22 2101    Lab Results:  Results for orders placed or performed during the hospital encounter of 10/28/22 (from the past 48 hour(s))  Hemoglobin A1c     Status: Abnormal   Collection Time: 10/29/22  6:12 PM  Result Value Ref Range   Hgb A1c MFr Bld 6.1 (H) 4.8 - 5.6 %    Comment: (NOTE)         Prediabetes: 5.7 - 6.4         Diabetes: >6.4         Glycemic control for adults with diabetes: <7.0    Mean Plasma Glucose 128 mg/dL    Comment: (NOTE) Performed At: [facility] [employer] [address] [place], [place] [id number] Jolene Schimke MD [id number]   Lipid panel     Status: Abnormal   Collection Time: 10/29/22  6:12 PM  Result Value Ref Range   Cholesterol 210 (H) 0 - 200 mg/dL   Triglycerides 027 (H) <150 mg/dL   HDL 50 >25 mg/dL   Total CHOL/HDL Ratio 4.2 RATIO   VLDL 67 (H) 0 - 40 mg/dL   LDL Cholesterol 93 0 - 99 mg/dL    Comment:        Total Cholesterol/HDL:CHD Risk Coronary Heart Disease Risk Table                     Men   Women  1/2 Average Risk   3.4   3.3  Average Risk       5.0   4.4  2 X Average Risk   9.6   7.1  3 X Average Risk  23.4   11.0        Use the calculated Patient Ratio above and the CHD Risk Table to  determine the patient's CHD Risk.        ATP III CLASSIFICATION (LDL):  <100     mg/dL   Optimal  366-440  mg/dL   Near or Above                    Optimal  130-159  mg/dL   Borderline  347-425  mg/dL   High  >956     mg/dL   Very High Performed at [facility], [address] [place], [place] [address]     Blood Alcohol level:  Lab Results  Component Value Date   [facility] <10 10/27/2022   ETH <10 10/07/2022    Metabolic Disorder Labs: Lab Results  Component Value Date   HGBA1C 6.1 (H) 10/29/2022   MPG 128 10/29/2022   No results found for: "PROLACTIN" Lab Results  Component Value Date   CHOL 210 (H) 10/29/2022   TRIG 337 (H) 10/29/2022   HDL 50 10/29/2022   CHOLHDL 4.2 10/29/2022   VLDL 67 (H) 10/29/2022   LDLCALC 93 10/29/2022    Physical Findings: AIMS:  , ,  ,  ,    CIWA:    COWS:     Musculoskeletal: Strength & Muscle Tone: within normal limits Gait & Station: normal Patient leans: N/A  Psychiatric Specialty Exam:  Presentation  General Appearance:  Appropriate for Environment; Casual  Eye Contact: Good  Speech: Clear and Coherent  Speech Volume: Normal  Handedness: Right   Mood and Affect  Mood: Anxious; Depressed (Improving)  Affect: Depressed   Thought Process  Thought Processes: Coherent  Descriptions of Associations:Intact  Orientation:Full (Time, Place and Person)  Thought Content:Logical  History of Schizophrenia/Schizoaffective disorder:No  Duration of Psychotic Symptoms:No data recorded Hallucinations:Hallucinations: None Description of Visual Hallucinations: Denies  Ideas of Reference:None  Suicidal Thoughts:Suicidal Thoughts: No  Homicidal Thoughts:Homicidal Thoughts: No   Sensorium  Memory: Immediate Good; Recent Good  Judgment: Fair  Insight: Poor   Executive Functions  Concentration: Good  Attention Span: Good  Recall: Fair  Fund of  Knowledge: Fair  Language: Good  Psychomotor Activity  Psychomotor Activity: Psychomotor Activity: Normal  Assets  Assets: Communication Skills; Desire for Improvement; Housing; Resilience; Physical Health   Sleep  Sleep: Sleep: Good Number of Hours of Sleep: 7.5  Physical Exam: Physical Exam Vitals and nursing note reviewed.  HENT:     Head: Normocephalic.     Nose: Nose normal.     Mouth/Throat:     Mouth: Mucous membranes are moist.     Pharynx: Oropharynx is clear.  Eyes:     Pupils: Pupils are equal, round, and reactive to light.  Cardiovascular:     Rate and Rhythm: Normal rate.     Pulses: Normal pulses.  Pulmonary:     Effort: Pulmonary effort is normal.  Musculoskeletal:        General: Normal range of motion.     Cervical back: Normal range of motion.  Skin:    General: Skin is warm.  Neurological:     General: No focal deficit present.     Mental Status: He is alert and oriented to person, place, and time.  Psychiatric:        Mood and Affect: Mood normal.        Behavior: Behavior normal.    Review of Systems  Constitutional:  Negative for fever.  HENT: Negative.    Eyes:  Negative for blurred vision.  Respiratory:  Negative for shortness of breath.   Cardiovascular:  Negative for chest pain.  Gastrointestinal:  Negative for nausea and vomiting.  Genitourinary: Negative.   Musculoskeletal:  Negative for myalgias.  Skin: Negative.  Negative for rash.  Neurological:  Positive for seizures (Hx of seizures).  Endo/Heme/Allergies:        See allergy listing  Psychiatric/Behavioral:  Positive for depression. The patient is nervous/anxious and has insomnia.    Blood pressure 103/71, pulse 92, temperature 98.3 F (36.8 [name]), temperature source Oral, resp. rate 18, height 6\' 1"  (1.854 m), weight 83 kg, SpO2 98 %. Body mass index is 24.14 kg/m.   Treatment Plan Summary: Daily contact with patient to assess and evaluate symptoms and progress in  treatment and Medication management  Physician Treatment Plan for Primary Diagnosis:  Assessment: Major depressive disorder, recurrent severe without psychotic features (HCC) Generalized anxiety disorder PTSD Seizure disorder   Plan: Medications: Continue Prozac 20 mg p.o. daily for depression, may titrate to 40 mg p.o. starting tomorrow 10/30/2022 Continue hydroxyzine 50 mg p.o. every 6 hours as needed for anxiety Continue Risperdal 1 mg p.o. daily for mood stabilization Continue Risperdal 2 mg p.o. at bedtime for mood stabilization Continue trazodone 100 mg p.o. at bedtime for insomnia   Other medical problems: Albuterol Ventolin HFA 108 90 base MCG/ACT inhaler 2 puffs into the lungs every 4 hours as needed for wheezing or shortness of breath Keppra 500 mg tablet 1 p.o. 2 times daily for seizures activity Lisinopril 10 mg tablet by mouth daily for high blood pressure   Agitation protocol: Benadryl capsule 50 mg p.o. 3 times daily as needed agitation or Benadryl injection 50 mg IM 3 times daily as needed  agitation   Haldol tablets 5 mg po 3 times daily as needed agitation or Haldol lactate injection 5 mg IM 3 times daily as needed agitation   Lorazepam tablet 2 mg p.o. 3 times daily as needed agitation or Lorazepam injection 2 mg IM 3 times daily as needed agitation   Other PRN Medications -Acetaminophen 650 mg every 6 as needed/mild pain -Maalox 30 mL oral every 4 as needed/digestion -Magnesium hydroxide 30 mL daily as needed/mild constipation --Nicotine gum 2 mg oral for smoking cessation   -- The risks/benefits/side-effects/alternatives to this medication were discussed in detail with the patient and time was given for questions. The patient consents to medication trial.              -- Encouraged patient to participate  in unit milieu and in scheduled group therapies    Safety and Monitoring: Voluntary admission to inpatient psychiatric unit for safety, stabilization  and treatment Daily contact with patient to assess and evaluate symptoms and progress in treatment Patient's case to be discussed in multi-disciplinary team meeting Observation Level : q15 minute checks Vital signs: q12 hours Precautions: suicide, but pt currently verbally contracts for safety on unit    Discharge Planning: Social work and case management to assist with discharge planning and identification of hospital follow-up needs prior to discharge Estimated LOS: 5-7 days Discharge Concerns: Need to establish a safety plan; Medication compliance and effectiveness Discharge Goals: Return home with outpatient referrals for mental health follow-up including medication management/psychotherapy.     Long Term Goal(s): Improvement in symptoms so as ready for discharge   Short Term Goals: Ability to identify changes in lifestyle to reduce recurrence of condition will improve, Ability to verbalize feelings will improve, Ability to disclose and discuss suicidal ideas, Ability to demonstrate self-control will improve, Ability to identify and develop effective coping behaviors will improve, Ability to maintain clinical measurements within normal limits will improve, Compliance with prescribed medications will improve, and Ability to identify triggers associated with substance abuse/mental health issues will improve   Physician Treatment Plan for Secondary Diagnosis: Principal Problem:   Major depressive disorder, recurrent severe without psychotic features (HCC)   I certify that inpatient services furnished can reasonably be expected to improve the patient's condition.     Cecilie Lowers, FNP 10/30/2022, 6:34 PM

## 2022-10-30 NOTE — Progress Notes (Signed)
   10/30/22 0928  Psych Admission Type (Psych Patients Only)  Admission Status Voluntary  Psychosocial Assessment  Patient Complaints Depression  Eye Contact Avoids  Facial Expression Flat  Affect Flat  Speech Logical/coherent  Interaction Minimal  Motor Activity Other (Comment) (WDL)  Appearance/Hygiene Unremarkable  Behavior Characteristics Guarded  Mood Depressed  Thought Process  Coherency WDL  Content WDL  Delusions None reported or observed  Perception WDL  Hallucination None reported or observed  Judgment Poor  Confusion None  Danger to Self  Current suicidal ideation? Passive  Self-Injurious Behavior Some self-injurious ideation observed or expressed.  No lethal plan expressed   Agreement Not to Harm Self Yes  Description of Agreement verbal  Danger to Others  Danger to Others None reported or observed

## 2022-10-31 DIAGNOSIS — F332 Major depressive disorder, recurrent severe without psychotic features: Secondary | ICD-10-CM | POA: Diagnosis not present

## 2022-10-31 NOTE — Progress Notes (Signed)
[facility] MD Progress Note  10/31/2022 4:43 PM [name]  MRN:  [id number]  Principal Problem: Major depressive disorder, recurrent severe without psychotic features (HCC) Diagnosis: Principal Problem:   Major depressive disorder, recurrent severe without psychotic features [facility])  Reason for admission:  Raymond [name] is a 42 year old Caucasian male with prior self diagnosed psychiatric history of anxiety, panic attacks, depression, PTSD, who presents voluntarily to [facility] from [facility] ED after stabilization for worsening depression, anxiety, panic attack, resulting in suicidal ideation by intentional overdosing on 7-8, 500 mg Keppra and self-mutilation.   24-hour chart Review: Past 24 hours of patient's chart was reviewed.  Patient is compliant with scheduled meds. Required Agitation PRNs: none As needed medications: Hydroxyzine 25 mg p.o. administered at 2101 yesterday for anxiety Per RN notes, no documented behavioral issues and is attending group. Patient slept, 7.5 hours.    Today's assessment notes: Patient seen and examined in his room lying on his bed.  He presents alert and oriented to person, place, time and situation.  Mood appears less depressed, however patient rates depression as #6/10 with 10 being highest severity.  He rates anxiety as #5/10 with 10 being highest severity.  This rating is much improved compared to previous days.  Encouraged to obtain as needed for anxiety from nursing staff.  He continues on his scheduled antidepressant of Prozac 40 mg p.o. daily and Risperdal 1 mg p.o. in a.m. and 2 mg p.o. at bedtime for mood stabilization.  Denies somatic discomfort.  Hygiene appears improved.  Observed patient interacting with other patient and participating in group activities in the dayroom.  He seems to be pleasant and enjoying group activities.  Denies acute discomfort.  Going to the cafeteria with the patient and eating all his meals.   He denies SI, HI or AVH.  Mood appears pleasant and brighter later in the afternoon.  Will continue current treatment plan as indicated below.  Total Time spent with patient: 30 minutes  Past Psychiatric History: Previous Psych Diagnoses: Major depressive disorder, recurrent severe without psychotic features, amphetamine abuse, cocaine abuse, substance-induced mood disorder, with suicide attempt.  Prior inpatient treatment: Admitted to [facility] behavioral health in [place] in 2020 for suicide attempt for 1 month.  Current/prior outpatient treatment: None, has scheduled appointment on June 2024 at 12 noon with [place] Prior rehab hx: Rehab for alcohol x 1 month in 2019 Psychotherapy hx: Yes History of suicide: X 4 in the last 3 days, another suicide attempt in 2020 History of homicide or aggression: Denies Psychiatric medication history: Thorazine, fluoxetine, hydroxyzine, risperidone, and trazodone. Psychiatric medication compliance history: Noncompliance Neuromodulation history: Denies  Current Psychiatrist: Denies Current therapist: Seeing Graciella Freer at the [place]  Past Medical History:  Past Medical History:  Diagnosis Date   Anxiety    Hepatitis C    PTSD (post-traumatic stress disorder)     Past Surgical History:  Procedure Laterality Date   FACIAL FRACTURE SURGERY     metal plate under right eye   Family History: History reviewed. No pertinent family history. Family Psychiatric  History: HPI Social History:  Social History   Substance and Sexual Activity  Alcohol Use Not Currently     Social History   Substance and Sexual Activity  Drug Use Not Currently    Social History   Socioeconomic History   Marital status: Single    Spouse name: Not on file   Number of children: Not on file   Years  of education: Not on file   Highest education level: Not on file  Occupational History   Not on file  Tobacco Use   Smoking status: Some Days     Types: Cigarettes   Smokeless tobacco: Never  Vaping Use   Vaping Use: Never used  Substance and Sexual Activity   Alcohol use: Not Currently   Drug use: Not Currently   Sexual activity: Not on file  Other Topics Concern   Not on file  Social History Narrative   Not on file   Social Determinants of Health   Financial Resource Strain: Not on file  Food Insecurity: Food Insecurity Present (10/28/2022)   Hunger Vital Sign    Worried About Running Out of Food in the Last Year: Sometimes true    Ran Out of Food in the Last Year: Sometimes true  Transportation Needs: No Transportation Needs (10/28/2022)   PRAPARE - Administrator, Civil Service (Medical): No    Lack of Transportation (Non-Medical): No  Physical Activity: Not on file  Stress: Not on file  Social Connections: Not on file   Additional Social History:    Sleep: Good  Appetite:  Good  Current Medications: Current Facility-Administered Medications  Medication Dose Route Frequency Provider Last Rate Last Admin   acetaminophen (TYLENOL) tablet 650 mg  650 mg Oral Q6H PRN Onuoha, Josephine C, NP       albuterol (VENTOLIN HFA) 108 (90 Base) MCG/ACT inhaler 2 puff  2 puff Inhalation Q4H PRN Dahlia Byes C, NP   2 puff at 10/31/22 0745   alum & mag hydroxide-simeth (MAALOX/MYLANTA) 200-200-20 MG/5ML suspension 30 mL  30 mL Oral Q4H PRN Dahlia Byes C, NP       diphenhydrAMINE (BENADRYL) capsule 50 mg  50 mg Oral TID PRN Dahlia Byes C, NP       Or   diphenhydrAMINE (BENADRYL) injection 50 mg  50 mg Intramuscular TID PRN Earney Navy, NP       FLUoxetine (PROZAC) capsule 40 mg  40 mg Oral Daily Rosanne Wohlfarth, Jesusita Oka, FNP   40 mg at 10/31/22 0741   haloperidol (HALDOL) tablet 5 mg  5 mg Oral TID PRN Dahlia Byes C, NP       Or   haloperidol lactate (HALDOL) injection 5 mg  5 mg Intramuscular TID PRN Earney Navy, NP       hydrOXYzine (ATARAX) tablet 50 mg  50 mg Oral Q6H PRN Dahlia Byes C, NP   50 mg at 10/31/22 0744   levETIRAcetam (KEPPRA) tablet 500 mg  500 mg Oral Q12H Massengill, Harrold Donath, MD   500 mg at 10/31/22 0742   lisinopril (ZESTRIL) tablet 10 mg  10 mg Oral Daily Dahlia Byes C, NP   10 mg at 10/31/22 0742   LORazepam (ATIVAN) tablet 2 mg  2 mg Oral TID PRN Earney Navy, NP       Or   LORazepam (ATIVAN) injection 2 mg  2 mg Intramuscular TID PRN Dahlia Byes C, NP       magnesium hydroxide (MILK OF MAGNESIA) suspension 30 mL  30 mL Oral Daily PRN Dahlia Byes C, NP       risperiDONE (RISPERDAL M-TABS) disintegrating tablet 1 mg  1 mg Oral Daily Charlton Boule C, FNP   1 mg at 10/31/22 0742   risperiDONE (RISPERDAL) tablet 2 mg  2 mg Oral QHS Cecilie Lowers, FNP   2 mg at 10/30/22 2111  traZODone (DESYREL) tablet 100 mg  100 mg Oral QHS Ciel Yanes, Jesusita Oka, FNP   100 mg at 10/30/22 2111    Lab Results:  Results for orders placed or performed during the hospital encounter of 10/28/22 (from the past 48 hour(s))  Hemoglobin A1c     Status: Abnormal   Collection Time: 10/29/22  6:12 PM  Result Value Ref Range   Hgb A1c MFr Bld 6.1 (H) 4.8 - 5.6 %    Comment: (NOTE)         Prediabetes: 5.7 - 6.4         Diabetes: >6.4         Glycemic control for adults with diabetes: <7.0    Mean Plasma Glucose 128 mg/dL    Comment: (NOTE) Performed At: [facility] [address] [place], [place] [id number] Jolene Schimke MD WU:[id number]   Lipid panel     Status: Abnormal   Collection Time: 10/29/22  6:12 PM  Result Value Ref Range   Cholesterol 210 (H) 0 - 200 mg/dL   Triglycerides 956 (H) <150 mg/dL   HDL 50 >21 mg/dL   Total CHOL/HDL Ratio 4.2 RATIO   VLDL 67 (H) 0 - 40 mg/dL   LDL Cholesterol 93 0 - 99 mg/dL    Comment:        Total Cholesterol/HDL:CHD Risk Coronary Heart Disease Risk Table                     Men   Women  1/2 Average Risk   3.4   3.3  Average Risk       5.0   4.4  2 X Average Risk   9.6   7.1  3 X Average Risk   23.4   11.0        Use the calculated Patient Ratio above and the CHD Risk Table to determine the patient's CHD Risk.        ATP III CLASSIFICATION (LDL):  <100     mg/dL   Optimal  308-657  mg/dL   Near or Above                    Optimal  130-159  mg/dL   Borderline  846-962  mg/dL   High  >952     mg/dL   Very High Performed at [facility], [address], [place], [place] [address]     Blood Alcohol level:  Lab Results  Component Value Date   [facility] <10 10/27/2022   ETH <10 10/07/2022    Metabolic Disorder Labs: Lab Results  Component Value Date   HGBA1C 6.1 (H) 10/29/2022   MPG 128 10/29/2022   No results found for: "PROLACTIN" Lab Results  Component Value Date   CHOL 210 (H) 10/29/2022   TRIG 337 (H) 10/29/2022   HDL 50 10/29/2022   CHOLHDL 4.2 10/29/2022   VLDL 67 (H) 10/29/2022   LDLCALC 93 10/29/2022    Physical Findings: AIMS:  , ,  ,  ,    CIWA:    COWS:     Musculoskeletal: Strength & Muscle Tone: within normal limits Gait & Station: normal Patient leans: N/A  Psychiatric Specialty Exam:  Presentation  General Appearance:  Appropriate for Environment; Casual  Eye Contact: Good  Speech: Clear and Coherent; Normal Rate  Speech Volume: Normal  Handedness: Right   Mood and Affect  Mood: Anxious; Depressed  Affect: Appropriate; Congruent   Thought Process  Thought Processes: Coherent  Descriptions of Associations:Intact  Orientation:Full (Time, Place and Person)  Thought Content:Logical  History of Schizophrenia/Schizoaffective disorder:No  Duration of Psychotic Symptoms:No data recorded Hallucinations:Hallucinations: None Description of Visual Hallucinations: Denies  Ideas of Reference:None  Suicidal Thoughts:Suicidal Thoughts: No  Homicidal Thoughts:Homicidal Thoughts: No  Sensorium  Memory: Immediate Good; Recent Good  Judgment: Fair  Insight: Fair  Art therapist   Concentration: Good  Attention Span: Good  Recall: Fair  Fund of Knowledge: Fair  Language: Good  Psychomotor Activity  Psychomotor Activity: Psychomotor Activity: Normal  Assets  Assets: Communication Skills; Desire for Improvement; Physical Health; Resilience  Sleep  Sleep: Sleep: Good Number of Hours of Sleep: 7  Physical Exam: Physical Exam Vitals and nursing note reviewed.  HENT:     Head: Normocephalic.     Nose: Nose normal.     Mouth/Throat:     Mouth: Mucous membranes are moist.     Pharynx: Oropharynx is clear.  Eyes:     Pupils: Pupils are equal, round, and reactive to light.  Cardiovascular:     Rate and Rhythm: Normal rate.     Pulses: Normal pulses.  Pulmonary:     Effort: Pulmonary effort is normal.  Abdominal:     Comments: Deferred  Genitourinary:    Comments: Deferred Musculoskeletal:        General: Normal range of motion.     Cervical back: Normal range of motion.  Skin:    General: Skin is warm.  Neurological:     General: No focal deficit present.     Mental Status: He is alert and oriented to person, place, and time.  Psychiatric:        Mood and Affect: Mood normal.        Behavior: Behavior normal.    Review of Systems  Constitutional:  Negative for fever.  HENT: Negative.    Eyes:  Negative for blurred vision.  Respiratory:  Negative for shortness of breath.   Cardiovascular:  Negative for chest pain.  Gastrointestinal:  Negative for nausea and vomiting.  Genitourinary: Negative.   Musculoskeletal:  Negative for myalgias.  Skin: Negative.  Negative for rash.  Neurological:  Positive for seizures (Hx of seizures).  Endo/Heme/Allergies:        See allergy listing  Psychiatric/Behavioral:  Positive for depression. The patient is nervous/anxious and has insomnia.    Blood pressure 117/73, pulse 93, temperature 97.9 F (36.6 C), temperature source Oral, resp. rate 18, height 6\' 1"  (1.854 m), weight 83 kg, SpO2 96 %.  Body mass index is 24.14 kg/m.  Treatment Plan Summary: Daily contact with patient to assess and evaluate symptoms and progress in treatment and Medication management  Physician Treatment Plan for Primary Diagnosis:  Assessment: Major depressive disorder, recurrent severe without psychotic features (HCC) Generalized anxiety disorder PTSD Seizure disorder   Plan: Medications: Continue Prozac 20 mg p.o. daily for depression, may titrate to 40 mg p.o. starting tomorrow 10/30/2022 Continue hydroxyzine 50 mg p.o. every 6 hours as needed for anxiety Continue Risperdal 1 mg p.o. daily for mood stabilization Continue Risperdal 2 mg p.o. at bedtime for mood stabilization Continue trazodone 100 mg p.o. at bedtime for insomnia   Other medical problems: Albuterol Ventolin HFA 108 90 base MCG/ACT inhaler 2 puffs into the lungs every 4 hours as needed for wheezing or shortness of breath Keppra 500 mg tablet 1 p.o. 2 times daily for seizures activity Lisinopril 10 mg tablet by mouth daily for high blood pressure  Agitation protocol: Benadryl capsule 50 mg p.o. 3 times daily as needed agitation or Benadryl injection 50 mg IM 3 times daily as needed agitation   Haldol tablets 5 mg po 3 times daily as needed agitation or Haldol lactate injection 5 mg IM 3 times daily as needed agitation   Lorazepam tablet 2 mg p.o. 3 times daily as needed agitation or Lorazepam injection 2 mg IM 3 times daily as needed agitation   Other PRN Medications -Acetaminophen 650 mg every 6 as needed/mild pain -Maalox 30 mL oral every 4 as needed/digestion -Magnesium hydroxide 30 mL daily as needed/mild constipation --Nicotine gum 2 mg oral for smoking cessation   -- The risks/benefits/side-effects/alternatives to this medication were discussed in detail with the patient and time was given for questions. The patient consents to medication trial.              -- Encouraged patient to participate  in unit milieu and  in scheduled group therapies    Safety and Monitoring: Voluntary admission to inpatient psychiatric unit for safety, stabilization and treatment Daily contact with patient to assess and evaluate symptoms and progress in treatment Patient's case to be discussed in multi-disciplinary team meeting Observation Level : q15 minute checks Vital signs: q12 hours Precautions: suicide, but pt currently verbally contracts for safety on unit    Discharge Planning: Social work and case management to assist with discharge planning and identification of hospital follow-up needs prior to discharge Estimated LOS: 5-7 days Discharge Concerns: Need to establish a safety plan; Medication compliance and effectiveness Discharge Goals: Return home with outpatient referrals for mental health follow-up including medication management/psychotherapy.     Long Term Goal(s): Improvement in symptoms so as ready for discharge   Short Term Goals: Ability to identify changes in lifestyle to reduce recurrence of condition will improve, Ability to verbalize feelings will improve, Ability to disclose and discuss suicidal ideas, Ability to demonstrate self-control will improve, Ability to identify and develop effective coping behaviors will improve, Ability to maintain clinical measurements within normal limits will improve, Compliance with prescribed medications will improve, and Ability to identify triggers associated with substance abuse/mental health issues will improve   Physician Treatment Plan for Secondary Diagnosis: Principal Problem:   Major depressive disorder, recurrent severe without psychotic features (HCC)   I certify that inpatient services furnished can reasonably be expected to improve the patient's condition.     Cecilie Lowers, FNP 10/31/2022, 4:43 PMPatient ID: Raymond [name], male   DOB: 1981/02/29, 42 y.o.   MRN: [id number]

## 2022-10-31 NOTE — B[facility] Group Notes (Signed)
Spiritual care group on grief and loss facilitated by chaplain Dyanne Carrel, [facility]  Group Goal:  Support / Education around grief and loss  Members engage in facilitated group support and psycho-social education.  Group Description:  Following introductions and group rules, group members engaged in facilitated group dialog and support around topic of loss, with particular support around experiences of loss in their lives. Group Identified types of loss (relationships / self / things) and identified patterns, circumstances, and changes that precipitate losses. Reflected on thoughts / feelings around loss, normalized grief responses, and recognized variety in grief experience. Group noted Worden's four tasks of grief in discussion.  Group drew on Adlerian / Rogerian, narrative, MI,  Patient Progress: Raymond [name] attended group and actively engaged and participated in group conversation.  He shared about the loss of his mother as well as two babies and then the loss of the relationship with his daughters' mother. His comments demonstrated good insight and he was supportive of his peers.

## 2022-10-31 NOTE — Progress Notes (Signed)
   10/30/22 2326  Psych Admission Type (Psych Patients Only)  Admission Status Voluntary  Psychosocial Assessment  Patient Complaints Depression  Eye Contact Fair  Facial Expression Flat  Affect Anxious  Speech Logical/coherent  Interaction Assertive  Motor Activity Other (Comment) (wdl)  Appearance/Hygiene Unremarkable  Behavior Characteristics Guarded  Mood Depressed  Thought Process  Coherency WDL  Content WDL  Delusions None reported or observed  Perception WDL  Hallucination None reported or observed  Judgment Poor  Confusion None  Danger to Self  Current suicidal ideation? Passive  Self-Injurious Behavior No self-injurious ideation or behavior indicators observed or expressed   Agreement Not to Harm Self Yes  Description of Agreement verbal contract  Danger to Others  Danger to Others None reported or observed

## 2022-10-31 NOTE — Group Note (Signed)
Recreation Therapy Group Note   Group Topic:Other  Group Date: 10/31/2022 Start Time: 1305 End Time: 1347 Facilitators: Lylliana Kitamura-McCall, LRT,CTRS Location: [address] Dayroom   Activity Description/Intervention: Therapeutic Drumming. Patients with peers and staff were given the opportunity to engage in a leader facilitated [facility] Group Empowerment Drumming Circle with staff from the [employer], in partnership with [employer]. Teaching laboratory technician and trained [employer], Theodoro Doing leading with LRT observing and documenting intervention and pt response. This evidenced-based practice targets 7 areas of health and wellbeing in the human experience including: stress-reduction, exercise, self-expression, camaraderie/support, nurturing, spirituality, and music-making (leisure).   Goal Area(s) Addresses:  Patient will engage in pro-social way in music group.  Patient will follow directions of drum leader on the first prompt. Patient will demonstrate no behavioral issues during group.  Patient will identify if a reduction in stress level occurs as a result of participation in therapeutic drum circle.    Affect/Mood: Appropriate   Participation Level: Engaged   Participation Quality: Independent   Behavior: Appropriate   Speech/Thought Process: Focused   Insight: Good   Judgement: Good   Modes of Intervention: Teaching laboratory technician   Patient Response to Interventions:  Engaged   Education Outcome:  Acknowledges education   Clinical Observations/Individualized Feedback:  Pt actively engaged in therapeutic drumming exercise and discussions. Pt was appropriate with peers, staff, and musical equipment for duration of programming.  Pt identified "happy" as their feeling after participation in music-based programming. Pt affect congruent/incongruent with verbalized emotion.    Plan: Continue to engage patient in RT group sessions  2-3x/week.   Fawne Hughley-McCall, LRT,CTRS 10/31/2022 2:23 PM

## 2022-10-31 NOTE — Progress Notes (Signed)
   10/31/22 0745  Psych Admission Type (Psych Patients Only)  Admission Status Voluntary  Psychosocial Assessment  Patient Complaints Anxiety;Depression;Hopelessness;Helplessness;Panic attack;Sadness;Worrying;Self-harm thoughts  Eye Contact Fair  Facial Expression Sad;Worried  Affect Anxious;Depressed;Sad  Speech Logical/coherent  Interaction Assertive;Needy  Motor Activity Other (Comment) (WDL)  Appearance/Hygiene Unremarkable  Behavior Characteristics Anxious  Mood Depressed  Thought Process  Coherency WDL  Content WDL  Delusions None reported or observed  Perception WDL  Hallucination None reported or observed  Judgment Poor  Confusion None  Danger to Self  Current suicidal ideation? Passive  Self-Injurious Behavior Some self-injurious ideation observed or expressed.  No lethal plan expressed   Agreement Not to Harm Self Yes  Description of Agreement verbal  Danger to Others  Danger to Others None reported or observed

## 2022-10-31 NOTE — Group Note (Signed)
Date:  10/31/2022 Time:  4:26 PM  Group Topic/Focus:  Orientation:   The focus of this group is to educate the patient on the purpose and policies of crisis stabilization and provide a format to answer questions about their admission.  The group details unit policies and expectations of patients while admitted.    Participation Level:  Active  Participation Quality:  Appropriate  Affect:  Appropriate  Cognitive:  Appropriate  Insight: Appropriate  Engagement in Group:  Engaged  Modes of Intervention:  Discussion  Additional Comments:     Reymundo Poll 10/31/2022, 4:26 PM

## 2022-10-31 NOTE — Progress Notes (Signed)
Attempted to check vitals again at 12pm pt stated he had just got vitals

## 2022-11-01 MED ORDER — NICOTINE POLACRILEX 2 MG MT GUM
2.0000 mg | CHEWING_GUM | OROMUCOSAL | Status: DC | PRN
  Administered 2022-11-02 – 2022-11-07 (×8): 2 mg via ORAL
  Filled 2022-11-01 (×3): qty 1

## 2022-11-01 NOTE — [facility] Group Notes (Signed)
[facility] Group Notes:  (Nursing/MHT/Case Management/Adjunct)  Date:  11/01/2022  Time:  2000  Type of Therapy:   wrap up group  Participation Level:  Active  Participation Quality:  Appropriate, Attentive, Sharing, and Supportive  Affect:  Anxious  Cognitive:  Alert  Insight:  Improving  Engagement in Group:  Engaged  Modes of Intervention:  Clarification, Education, and Socialization  Summary of Progress/Problems: Positive thinking and self-care were discussed.   Marcille Buffy 11/01/2022, 9:12 PM

## 2022-11-01 NOTE — Progress Notes (Signed)
   10/31/22 2300  Psych Admission Type (Psych Patients Only)  Admission Status Voluntary  Psychosocial Assessment  Patient Complaints None  Eye Contact Fair  Facial Expression Animated  Affect UTA  Speech Logical/coherent  Interaction Assertive  Motor Activity Other (Comment) (wdl)  Appearance/Hygiene Unremarkable  Behavior Characteristics Anxious  Mood Depressed  Thought Process  Coherency WDL  Content WDL  Delusions None reported or observed  Perception WDL  Hallucination None reported or observed  Judgment Poor  Confusion None  Danger to Self  Current suicidal ideation? Passive  Self-Injurious Behavior No self-injurious ideation or behavior indicators observed or expressed   Agreement Not to Harm Self Yes  Description of Agreement verbal  Danger to Others  Danger to Others None reported or observed   Patient endorses SI but contracts for safety Denies HI/AVH Was pleasant and coopeative and will continue to monitor

## 2022-11-01 NOTE — Progress Notes (Addendum)
D Patient has been visible in the milieu, observed interacting appropriately with peers. Per pt's self inventory, pt rated his depression,hopelessness and anxiety a 4/5/5, respectively.  No further complaints of anxiety reported. Per MHT- pt was overheard in the hallway bragging to his peers , "I know how to fake a panic attack," and then patient was seen demonstrating to them how by shaking his arms.  A. Labs and vitals monitored. Pt given and educated on medications. Pt supported emotionally and encouraged to express concerns and ask questions.   R. Pt remains safe with 15 minute checks. Will continue POC.

## 2022-11-01 NOTE — Group Note (Signed)
LCSW Group Therapy Note  11/01/2022   10:30-11:30am   Topic:  Anger and its Underlying Emotions  Participation Level:  Did Not Attend  Description of Group:   In this group, patients identified the last situation that provoked them to anger.   We then talked about the Anger Iceberg and the way many underlying feelings lead to feelings of anger.  Focus was placed on how helpful it is to recognize the underlying emotions to anger in order to address these for more permanent resolution.  Because the group was supposed to be on social wellness, CSW continually made the connection between our ability to recognize our feelings and our ability to convey our emotional needs to those around [place] in a more helpful manner.    Therapeutic Goals: Patients will share emotions that commonly incite their anger and how they typically respond Patients will identify how their coping skills work for them and/or against them Patients will explore possible alternative thoughts to their automatic ones Patients will learn that anger itself is normal and that healthier reactions can assist with resolving conflict rather than worsening situations  Summary of Patient Progress:  Patient was invited to group, did not attend. He did arrive in the room in the last few minutes.  Therapeutic Modalities:   Cognitive Behavioral Therapy Processing  Lynnell Chad, MSW, LCSW

## 2022-11-01 NOTE — Progress Notes (Signed)
Pt observed upon initial approach sitting on the floor with his back against the wall with complaints of "trouble breathing". Pt's VS obtained BP 147/99, HR 96 Resp 20 and O2 97%. Pt reported that a similar event happened to him a couple of days ago "after taking his medications." Pt alert and oriented - agreed to go lay down in his room. Pt escorted to his room. Shortly after laying down, pt went to the bathroom and reported that he threw up. Pt given a pitcher of ginger ale for complaints of his throat "burning." Pt now resting with eyes closed- appears to be sleeping- respirations even and unlabored. Provider made aware.

## 2022-11-01 NOTE — Progress Notes (Signed)
[facility] MD Progress Note  11/01/2022 11:25 AM Raymond [name]  MRN:  [id number]  Subjective: Raymond [name] reports, " I had seizures activity this morning and did not send security saw me."  Principal Problem: Major depressive disorder, recurrent severe without psychotic features (HCC) Diagnosis: Principal Problem:   Major depressive disorder, recurrent severe without psychotic features [name])  Reason for admission:  Raymond [name] is a 42 year old Caucasian male with prior self diagnosed psychiatric history of anxiety, panic attacks, depression, PTSD, who presents voluntarily to [facility] from [facility] ED after stabilization for worsening depression, anxiety, panic attack, resulting in suicidal ideation by intentional overdosing on 7-8, 500 mg Keppra and self-mutilation.   24-hour chart Review: Past 24 hours of patient's chart was reviewed.  Patient is compliant with scheduled meds. Required Agitation PRNs: none As needed medications: Hydroxyzine 25 mg p.o. administered at 0737 today for anxiety and albuterol given at 07 40 and 1411 for wheezing Per RN notes, no documented behavioral issues and is attending group. Patient slept, 8 hours.    Today's assessment notes: Patient seen and examined in his room lying on his bed and reported that he experienced seizures activity in the morning.  Nursing staff reported patient having anxiety attack and was communicating with her throughout the episode.  Nursing staff reported directing patient to lay down on his bed and was medicated for anxiety.  He presents alert and oriented to person, place, time and situation.  Mood appears less depressed, however patient rates depression as #4/10 and anxiety as #5/10, with 10 being highest severity. Encouraged to obtain as needed for anxiety from nursing staff.    He continues on his scheduled antidepressant of Prozac 40 mg p.o. daily and Risperdal 1 mg p.o. in a.m. and 2 mg p.o. at  bedtime for mood stabilization.  Denies somatic discomfort.  Hygiene appears improved.  Observed patient interacting with other patient and participating in group activities in the dayroom.  He seems to be pleasant and enjoying group activities.  Denies acute discomfort.  Going to the cafeteria with the patient and eating all his meals.  He denies SI, HI or AVH.  No further episode of anxiety attack observed.   Will continue current treatment plan as indicated below.   Total Time spent with patient: 30 minutes  Past Psychiatric History: Previous Psych Diagnoses: Major depressive disorder, recurrent severe without psychotic features, amphetamine abuse, cocaine abuse, substance-induced mood disorder, with suicide attempt.  Prior inpatient treatment: Admitted to [facility] behavioral health in [place] in 2020 for suicide attempt for 1 month.  Current/prior outpatient treatment: None, has scheduled appointment on June 2024 at 12 noon with [place] Prior rehab hx: Rehab for alcohol x 1 month in 2019 Psychotherapy hx: Yes History of suicide: X 4 in the last 3 days, another suicide attempt in 2020 History of homicide or aggression: Denies Psychiatric medication history: Thorazine, fluoxetine, hydroxyzine, risperidone, and trazodone. Psychiatric medication compliance history: Noncompliance Neuromodulation history: Denies  Current Psychiatrist: Denies Current therapist: Seeing Graciella Freer at the [place]  Past Medical History:  Past Medical History:  Diagnosis Date   Anxiety    Hepatitis C    PTSD (post-traumatic stress disorder)     Past Surgical History:  Procedure Laterality Date   FACIAL FRACTURE SURGERY     metal plate under right eye   Family History: History reviewed. No pertinent family history. Family Psychiatric  History: HPI Social History:  Social History   Substance and Sexual  Activity  Alcohol Use Not Currently     Social History   Substance and Sexual  Activity  Drug Use Not Currently    Social History   Socioeconomic History   Marital status: Single    Spouse name: Not on file   Number of children: Not on file   Years of education: Not on file   Highest education level: Not on file  Occupational History   Not on file  Tobacco Use   Smoking status: Some Days    Types: Cigarettes   Smokeless tobacco: Never  Vaping Use   Vaping Use: Never used  Substance and Sexual Activity   Alcohol use: Not Currently   Drug use: Not Currently   Sexual activity: Not on file  Other Topics Concern   Not on file  Social History Narrative   Not on file   Social Determinants of Health   Financial Resource Strain: Not on file  Food Insecurity: Food Insecurity Present (10/28/2022)   Hunger Vital Sign    Worried About Running Out of Food in the Last Year: Sometimes true    Ran Out of Food in the Last Year: Sometimes true  Transportation Needs: No Transportation Needs (10/28/2022)   PRAPARE - Administrator, Civil Service (Medical): No    Lack of Transportation (Non-Medical): No  Physical Activity: Not on file  Stress: Not on file  Social Connections: Not on file   Additional Social History:    Sleep: Good  Appetite:  Good  Current Medications: Current Facility-Administered Medications  Medication Dose Route Frequency Provider Last Rate Last Admin   acetaminophen (TYLENOL) tablet 650 mg  650 mg Oral Q6H PRN Onuoha, Josephine C, NP       albuterol (VENTOLIN HFA) 108 (90 Base) MCG/ACT inhaler 2 puff  2 puff Inhalation Q4H PRN Dahlia Byes C, NP   2 puff at 11/01/22 0740   alum & mag hydroxide-simeth (MAALOX/MYLANTA) 200-200-20 MG/5ML suspension 30 mL  30 mL Oral Q4H PRN Dahlia Byes C, NP       diphenhydrAMINE (BENADRYL) capsule 50 mg  50 mg Oral TID PRN Dahlia Byes C, NP       Or   diphenhydrAMINE (BENADRYL) injection 50 mg  50 mg Intramuscular TID PRN Dahlia Byes C, NP       FLUoxetine (PROZAC) capsule  40 mg  40 mg Oral Daily Eastyn Dattilo C, FNP   40 mg at 11/01/22 0735   haloperidol (HALDOL) tablet 5 mg  5 mg Oral TID PRN Dahlia Byes C, NP       Or   haloperidol lactate (HALDOL) injection 5 mg  5 mg Intramuscular TID PRN Dahlia Byes C, NP       hydrOXYzine (ATARAX) tablet 50 mg  50 mg Oral Q6H PRN Dahlia Byes C, NP   50 mg at 11/01/22 0737   levETIRAcetam (KEPPRA) tablet 500 mg  500 mg Oral Q12H Massengill, Nathan, MD   500 mg at 11/01/22 0736   lisinopril (ZESTRIL) tablet 10 mg  10 mg Oral Daily Dahlia Byes C, NP   10 mg at 11/01/22 0735   LORazepam (ATIVAN) tablet 2 mg  2 mg Oral TID PRN Earney Navy, NP       Or   LORazepam (ATIVAN) injection 2 mg  2 mg Intramuscular TID PRN Dahlia Byes C, NP       magnesium hydroxide (MILK OF MAGNESIA) suspension 30 mL  30 mL Oral Daily PRN Dahlia Byes  C, NP       risperiDONE (RISPERDAL M-TABS) disintegrating tablet 1 mg  1 mg Oral Daily Oviya Ammar C, FNP   1 mg at 11/01/22 0735   risperiDONE (RISPERDAL) tablet 2 mg  2 mg Oral QHS Makenzi Bannister C, FNP   2 mg at 10/31/22 2037   traZODone (DESYREL) tablet 100 mg  100 mg Oral QHS Cecilie Lowers, FNP   100 mg at 10/31/22 2038    Lab Results:  No results found for this or any previous visit (from the past 48 hour(s)).   Blood Alcohol level:  Lab Results  Component Value Date   ETH <10 10/27/2022   ETH <10 10/07/2022    Metabolic Disorder Labs: Lab Results  Component Value Date   HGBA1C 6.1 (H) 10/29/2022   MPG 128 10/29/2022   No results found for: "PROLACTIN" Lab Results  Component Value Date   CHOL 210 (H) 10/29/2022   TRIG 337 (H) 10/29/2022   HDL 50 10/29/2022   CHOLHDL 4.2 10/29/2022   VLDL 67 (H) 10/29/2022   LDLCALC 93 10/29/2022    Physical Findings: AIMS:  , ,  ,  ,    CIWA:    COWS:     Musculoskeletal: Strength & Muscle Tone: within normal limits Gait & Station: normal Patient leans: N/A  Psychiatric Specialty  Exam:  Presentation  General Appearance:  Appropriate for Environment; Casual; Fairly Groomed  Eye Contact: Good  Speech: Clear and Coherent; Normal Rate  Speech Volume: Normal  Handedness: Right   Mood and Affect  Mood: Anxious; Depressed  Affect: Congruent   Thought Process  Thought Processes: Coherent  Descriptions of Associations:Intact  Orientation:Full (Time, Place and Person)  Thought Content:Logical  History of Schizophrenia/Schizoaffective disorder:No  Duration of Psychotic Symptoms:No data recorded Hallucinations:Hallucinations: None Description of Visual Hallucinations: denies  Ideas of Reference:None  Suicidal Thoughts:Suicidal Thoughts: No  Homicidal Thoughts:Homicidal Thoughts: No  Sensorium  Memory: Immediate Good; Recent Good  Judgment: Fair  Insight: Fair  Art therapist  Concentration: Good  Attention Span: Good  Recall: Fair  Fund of Knowledge: Fair  Language: Good  Psychomotor Activity  Psychomotor Activity: Psychomotor Activity: Normal  Assets  Assets: Communication Skills; Desire for Improvement; Physical Health; Resilience  Sleep  Sleep: Sleep: Good Number of Hours of Sleep: 8  Physical Exam: Physical Exam Vitals and nursing note reviewed.  HENT:     Head: Normocephalic.     Nose: Nose normal.     Mouth/Throat:     Mouth: Mucous membranes are moist.     Pharynx: Oropharynx is clear.  Eyes:     Pupils: Pupils are equal, round, and reactive to light.  Cardiovascular:     Rate and Rhythm: Normal rate.     Pulses: Normal pulses.  Pulmonary:     Effort: Pulmonary effort is normal.  Abdominal:     Comments: Deferred  Genitourinary:    Comments: Deferred Musculoskeletal:        General: Normal range of motion.     Cervical back: Normal range of motion.  Skin:    General: Skin is warm.  Neurological:     General: No focal deficit present.     Mental Status: He is alert and oriented  to person, place, and time.  Psychiatric:        Mood and Affect: Mood normal.        Behavior: Behavior normal.    Review of Systems  Constitutional:  Negative for fever.  HENT: Negative.    Eyes:  Negative for blurred vision.  Respiratory:  Negative for shortness of breath.   Cardiovascular:  Negative for chest pain.  Gastrointestinal:  Negative for nausea and vomiting.  Genitourinary: Negative.   Musculoskeletal:  Negative for myalgias.  Skin: Negative.  Negative for rash.  Neurological:  Positive for seizures (Hx of seizures).  Endo/Heme/Allergies:        See allergy listing  Psychiatric/Behavioral:  Positive for depression. The patient is nervous/anxious and has insomnia.    Blood pressure (!) 147/99, pulse 96, temperature (!) 97.5 F (36.4 C), temperature source Oral, resp. rate 20, height 6\' 1"  (1.854 m), weight 83 kg, SpO2 97 %. Body mass index is 24.14 kg/m.  Treatment Plan Summary: Daily contact with patient to assess and evaluate symptoms and progress in treatment and Medication management  Physician Treatment Plan for Primary Diagnosis:  Assessment: Major depressive disorder, recurrent severe without psychotic features (HCC) Generalized anxiety disorder PTSD Seizure disorder   Plan: Medications: Continue Prozac 20 mg p.o. daily for depression, may titrate to 40 mg p.o. starting tomorrow 10/30/2022 Continue hydroxyzine 50 mg p.o. every 6 hours as needed for anxiety Continue Risperdal 1 mg p.o. daily for mood stabilization Continue Risperdal 2 mg p.o. at bedtime for mood stabilization Continue trazodone 100 mg p.o. at bedtime for insomnia   Other medical problems: Albuterol Ventolin HFA 108 90 base MCG/ACT inhaler 2 puffs into the lungs every 4 hours as needed for wheezing or shortness of breath Keppra 500 mg tablet 1 p.o. 2 times daily for seizures activity Lisinopril 10 mg tablet by mouth daily for high blood pressure   Agitation protocol: Benadryl capsule  50 mg p.o. 3 times daily as needed agitation or Benadryl injection 50 mg IM 3 times daily as needed agitation   Haldol tablets 5 mg po 3 times daily as needed agitation or Haldol lactate injection 5 mg IM 3 times daily as needed agitation   Lorazepam tablet 2 mg p.o. 3 times daily as needed agitation or Lorazepam injection 2 mg IM 3 times daily as needed agitation   Other PRN Medications -Acetaminophen 650 mg every 6 as needed/mild pain -Maalox 30 mL oral every 4 as needed/digestion -Magnesium hydroxide 30 mL daily as needed/mild constipation --Nicotine gum 2 mg oral for smoking cessation   -- The risks/benefits/side-effects/alternatives to this medication were discussed in detail with the patient and time was given for questions. The patient consents to medication trial.              -- Encouraged patient to participate  in unit milieu and in scheduled group therapies    Safety and Monitoring: Voluntary admission to inpatient psychiatric unit for safety, stabilization and treatment Daily contact with patient to assess and evaluate symptoms and progress in treatment Patient's case to be discussed in multi-disciplinary team meeting Observation Level : q15 minute checks Vital signs: q12 hours Precautions: suicide, but pt currently verbally contracts for safety on unit    Discharge Planning: Social work and case management to assist with discharge planning and identification of hospital follow-up needs prior to discharge Estimated LOS: 5-7 days Discharge Concerns: Need to establish a safety plan; Medication compliance and effectiveness Discharge Goals: Return home with outpatient referrals for mental health follow-up including medication management/psychotherapy.    Long Term Goal(s): Improvement in symptoms so as ready for discharge   Short Term Goals: Ability to identify changes in lifestyle to reduce recurrence of condition will improve, Ability to verbalize  feelings will improve,  Ability to disclose and discuss suicidal ideas, Ability to demonstrate self-control will improve, Ability to identify and develop effective coping behaviors will improve, Ability to maintain clinical measurements within normal limits will improve, Compliance with prescribed medications will improve, and Ability to identify triggers associated with substance abuse/mental health issues will improve   Physician Treatment Plan for Secondary Diagnosis: Principal Problem:   Major depressive disorder, recurrent severe without psychotic features (HCC)   I certify that inpatient services furnished can reasonably be expected to improve the patient's condition.     Cecilie Lowers, FNP 11/01/2022, 11:25 AMPatient ID: Raymond [name], male   DOB: March 20, 1981, 42 y.o.   MRN: [id number] Patient ID: Carmichael Hopps, male   DOB: March 15, 1981, 42 y.o.   MRN: [id number]

## 2022-11-01 NOTE — Progress Notes (Signed)
   11/01/22 2159  Psych Admission Type (Psych Patients Only)  Admission Status Voluntary  Psychosocial Assessment  Patient Complaints None  Eye Contact Fair  Facial Expression Grimacing  Affect UTA  Speech Logical/coherent  Interaction Assertive  Motor Activity Other (Comment) (wdl)  Appearance/Hygiene Unremarkable  Behavior Characteristics Cooperative  Mood Ambivalent  Thought Process  Coherency WDL  Content WDL  Delusions None reported or observed  Perception WDL  Hallucination None reported or observed  Judgment Poor  Confusion None  Danger to Self  Current suicidal ideation? Passive  Self-Injurious Behavior No self-injurious ideation or behavior indicators observed or expressed   Agreement Not to Harm Self Yes  Description of Agreement verbal  Danger to Others  Danger to Others None reported or observed   Patient endorses SI but contracts for safety Anxiety is on and off. Was ambivalent  Denies HI/AVH Was pleasant and coopeative and will continue to monitor

## 2022-11-02 MED ORDER — PRAZOSIN HCL 1 MG PO CAPS
1.0000 mg | ORAL_CAPSULE | Freq: Every day | ORAL | Status: DC
  Administered 2022-11-02 – 2022-11-06 (×5): 1 mg via ORAL
  Filled 2022-11-02 (×7): qty 1

## 2022-11-02 NOTE — Group Note (Signed)
Date:  11/02/2022 Time:  4:50 PM  Group Topic/Focus:  Building Self Esteem:   The Focus of this group is helping patients become aware of the effects of self-esteem on their lives, the things they and others do that enhance or undermine their self-esteem, seeing the relationship between their level of self-esteem and the choices they make and learning ways to enhance self-esteem. Dimensions of Wellness:   The focus of this group is to introduce the topic of wellness and discuss the role each dimension of wellness plays in total health. Emotional Education:   The focus of this group is to discuss what feelings/emotions are, and how they are experienced.    Participation Level:  Active  Participation Quality:  Appropriate  Affect:  Appropriate  Cognitive:  Appropriate  Insight: Appropriate  Engagement in Group:  Engaged  Modes of Intervention:  Activity, Education, and Support  Additional Comments:   Pt attended and participated in the Emotional Wellness group. Pt completed the self-esteem and self- love building activity.  Raymond [name] M Raymond [name] 11/02/2022, 4:50 PM  

## 2022-11-02 NOTE — [facility] Suicide Risk Assessment (Signed)
[facility] INPATIENT:  Family/Significant Other Suicide Prevention Education  Suicide Prevention Education:  Education Completed; Raymond [name], has been identified by the patient as the family member/significant other with whom the patient will be residing, and identified as the person(s) who will aid the patient in the event of a mental health crisis (suicidal ideations/suicide attempt).  With written consent from the patient, the family member/significant other has been provided the following suicide prevention education, prior to the and/or following the discharge of the patient.  The suicide prevention education provided includes the following: Suicide risk factors Suicide prevention and interventions National Suicide Hotline telephone number [facility] assessment telephone number [facility] Emergency Assistance 911 [facility] and/or Residential Mobile Crisis Unit telephone number  Request made of family/significant other to: Remove weapons (e.g., guns, rifles, knives), all items previously/currently identified as safety concern.   Remove drugs/medications (over-the-counter, prescriptions, illicit drugs), all items previously/currently identified as a safety concern.  The family member/significant other verbalizes understanding of the suicide prevention education information provided.  The family member/significant other agrees to remove the items of safety concern listed above.  Raymond [name] 11/02/2022, 12:01 PM

## 2022-11-02 NOTE — Progress Notes (Signed)
Adult Psychoeducational Group Note  Date:  11/02/2022 Time:  11:34 PM  Group Topic/Focus:  Wrap-Up Group:   The focus of this group is to help patients review their daily goal of treatment and discuss progress on daily workbooks.  Participation Level:  Active  Participation Quality:  Appropriate  Affect:  Appropriate  Cognitive:  Appropriate  Insight: Appropriate  Engagement in Group:  Engaged  Modes of Intervention:  Discussion  Additional Comments:  Pt states goal today, was to stay true to self. Pt states despite not being where he wants to be mentally, pt still manages to get up every morning and get things done. Pt states something positive that happened for him today, was talking to dad after not talking to him since mom passed. Pt rated day a 6/10.  Aster Screws Katrinka Blazing 11/02/2022, 11:34 PM

## 2022-11-02 NOTE — [facility] Group Notes (Signed)
[facility] Group Notes:  (Nursing)  Date:  11/02/2022  Time:  1415  Type of Therapy:  Psychoeducational Skills  Participation Level:  Active  Participation Quality:  Appropriate and Attentive  Affect:  Appropriate  Cognitive:  Alert and Appropriate  Insight:  Good  Engagement in Group:  Engaged  Modes of Intervention:  Activity, Exploration, Rapport Building, Socialization, and Support  Summary of Progress/Problems:  Raymond [name] 11/02/2022, 3:24 PM

## 2022-11-02 NOTE — Group Note (Signed)
Date:  11/02/2022 Time:  1:13 PM  Group Topic/Focus:  Goals Group:   The focus of this group is to help patients establish daily goals to achieve during treatment and discuss how the patient can incorporate goal setting into their daily lives to aide in recovery. Orientation:   The focus of this group is to educate the patient on the purpose and policies of crisis stabilization and provide a format to answer questions about their admission.  The group details unit policies and expectations of patients while admitted.    Participation Level:  Active  Participation Quality:  Appropriate  Affect:  Appropriate  Cognitive:  Appropriate  Insight: Appropriate  Engagement in Group:  Engaged  Modes of Intervention:  Discussion, Orientation, and Support  Additional Comments:   Pt attended and participated in the Orientation/Goals group. Pt personal goal is to stop worrying about his health and to increase self worth.  Raymond [name] 11/02/2022, 1:13 PM

## 2022-11-02 NOTE — Progress Notes (Addendum)
D. Pt has been visible in the milieu throughout the shift, observed interacting well with peers and attending groups. Per pt's self inventory, pt rated his depression,hopelessness and anxiety a 5/6/7, respectively. Pt reported experiencing SI "on and off" without a plan , but stated that his SI was chronic. Pt verbally agreed that he would contact staff before acting on any harmful thoughts.  A. Labs and vitals monitored. Pt given and educated on medications. Pt supported emotionally and encouraged to express concerns and ask questions.   R. Pt remains safe with 15 minute checks. Will continue POC.    11/02/22 1000  Psych Admission Type (Psych Patients Only)  Admission Status Voluntary  Psychosocial Assessment  Patient Complaints Anxiety  Eye Contact Fair  Facial Expression Anxious  Affect Appropriate to circumstance  Speech Logical/coherent  Interaction Assertive  Motor Activity Other (Comment) (steady gait)  Appearance/Hygiene Unremarkable  Behavior Characteristics Cooperative;Anxious  Mood Anxious;Ambivalent  Thought Process  Coherency WDL  Content WDL  Delusions None reported or observed  Perception WDL  Hallucination None reported or observed  Judgment Poor  Confusion None  Danger to Self  Current suicidal ideation? Passive  Self-Injurious Behavior No self-injurious ideation or behavior indicators observed or expressed   Agreement Not to Harm Self Yes  Description of Agreement verbal contract for safety  Danger to Others  Danger to Others None reported or observed

## 2022-11-02 NOTE — Progress Notes (Cosign Needed Addendum)
[facility] MD Progress Note  11/02/2022 11:40 AM Raymond [name]  MRN:  [id number]  Subjective: Raymond [name] reports, "I attempted to hang myself in the bathroom yesterday in the evening."  Principal Problem: Major depressive disorder, recurrent severe without psychotic features (HCC) Diagnosis: Principal Problem:   Major depressive disorder, recurrent severe without psychotic features (HCC)  Reason for admission:  Raymond [name] is a 42 year old Caucasian male with prior self diagnosed psychiatric history of anxiety, panic attacks, depression, PTSD, who presents voluntarily to [facility] from [facility] ED after stabilization for worsening depression, anxiety, panic attack, resulting in suicidal ideation by intentional overdosing on 7-8, 500 mg Keppra and self-mutilation.   24-hour chart Review: Past 24 hours of patient's chart was reviewed.  Patient is compliant with scheduled meds. Required Agitation PRNs: none As needed medications: Hydroxyzine 25 mg p.o. administered at 0737 today for anxiety and albuterol given at 2042 yesterday and 0734 today for wheezing. Per RN notes, no documented behavioral issues and is attending group. Patient slept, 7 hours.    Today's assessment notes: Patient seen and examined on [address] sitting up in a chair.  He presents alert and oriented to person, place, time & situation.  Mood is brighter, euthymic, and observed in the day room playing games with the other patients.  He is very interactive and appears to be enjoying playing cards with other patients in the day room.  He reports history of chronic suicidal ideations, and made this provider aware that he attempted to hang himself in the bathroom yesterday in the evening.  However, he did not report this incident to any staff.  Able to contract for safety while in the hospital.  15 minutes safety monitoring continues for safety.  Reports sleeping over 7 hours last night and having  nightmares.  Prazosin 1 mg p.o. q. nightly initiated.  Patient rates depression as #7/10 and anxiety as #6/10, with 10 being highest severity. Encouraged to obtain as needed for anxiety from nursing staff.  He continues on his scheduled antidepressant of Prozac 40 mg p.o. daily and Risperdal 1 mg p.o. in a.m. and 2 mg p.o. at bedtime for mood stabilization.  Denies somatic discomfort.  Hygiene appears improved.   Denies acute discomfort.  Going to the cafeteria with the patient and eating all his meals.  He denies SI, HI or AVH.  No further episode of anxiety attack observed.   Will continue current treatment plan with adjustment as indicated below as indicated below.   Total Time spent with patient: 30 minutes  Past Psychiatric History: Previous Psych Diagnoses: Major depressive disorder, recurrent severe without psychotic features, amphetamine abuse, cocaine abuse, substance-induced mood disorder, with suicide attempt.  Prior inpatient treatment: Admitted to [facility] behavioral [facility] in [place] in 2020 for suicide attempt for 1 month.  Current/prior outpatient treatment: None, has scheduled appointment on June 2024 at 12 noon with [place] Prior rehab hx: Rehab for alcohol x 1 month in 2019 Psychotherapy hx: Yes History of suicide: X 4 in the last 3 days, another suicide attempt in 2020 History of homicide or aggression: Denies Psychiatric medication history: Thorazine, fluoxetine, hydroxyzine, risperidone, and trazodone. Psychiatric medication compliance history: Noncompliance Neuromodulation history: Denies  Current Psychiatrist: Denies Current therapist: Seeing Raymond [name] at [facility]  Past Medical History:  Past Medical History:  Diagnosis Date  . Anxiety   . Hepatitis C   . PTSD (post-traumatic stress disorder)     Past Surgical History:  Procedure  Laterality Date  . FACIAL FRACTURE SURGERY     metal plate under right eye   Family History: History reviewed.  No pertinent family history. Family Psychiatric  History: HPI Social History:  Social History   Substance and Sexual Activity  Alcohol Use Not Currently     Social History   Substance and Sexual Activity  Drug Use Not Currently    Social History   Socioeconomic History  . Marital status: Single    Spouse name: Not on file  . Number of children: Not on file  . Years of education: Not on file  . Highest education level: Not on file  Occupational History  . Not on file  Tobacco Use  . Smoking status: Some Days    Types: Cigarettes  . Smokeless tobacco: Never  Vaping Use  . Vaping Use: Never used  Substance and Sexual Activity  . Alcohol use: Not Currently  . Drug use: Not Currently  . Sexual activity: Not on file  Other Topics Concern  . Not on file  Social History Narrative  . Not on file   Social Determinants of Health   Financial Resource Strain: Not on file  Food Insecurity: Food Insecurity Present (10/28/2022)   Hunger Vital Sign   . Worried About Programme researcher, broadcasting/film/video in the Last Year: Sometimes true   . Ran Out of Food in the Last Year: Sometimes true  Transportation Needs: No Transportation Needs (10/28/2022)   PRAPARE - Transportation   . Lack of Transportation (Medical): No   . Lack of Transportation (Non-Medical): No  Physical Activity: Not on file  Stress: Not on file  Social Connections: Not on file   Additional Social History:    Sleep: Good  Appetite:  Good  Current Medications: Current Facility-Administered Medications  Medication Dose Route Frequency Provider Last Rate Last Admin  . acetaminophen (TYLENOL) tablet 650 mg  650 mg Oral Q6H PRN Onuoha, Josephine C, NP      . albuterol (VENTOLIN HFA) 108 (90 Base) MCG/ACT inhaler 2 puff  2 puff Inhalation Q4H PRN Dahlia Byes C, NP   2 puff at 11/02/22 0734  . alum & mag hydroxide-simeth (MAALOX/MYLANTA) 200-200-20 MG/5ML suspension 30 mL  30 mL Oral Q4H PRN Onuoha, Josephine C, NP      .  diphenhydrAMINE (BENADRYL) capsule 50 mg  50 mg Oral TID PRN Dahlia Byes C, NP       Or  . diphenhydrAMINE (BENADRYL) injection 50 mg  50 mg Intramuscular TID PRN Dahlia Byes C, NP      . FLUoxetine (PROZAC) capsule 40 mg  40 mg Oral Daily Navreet Bolda C, FNP   40 mg at 11/02/22 0734  . haloperidol (HALDOL) tablet 5 mg  5 mg Oral TID PRN Dahlia Byes C, NP       Or  . haloperidol lactate (HALDOL) injection 5 mg  5 mg Intramuscular TID PRN Dahlia Byes C, NP      . hydrOXYzine (ATARAX) tablet 50 mg  50 mg Oral Q6H PRN Dahlia Byes C, NP   50 mg at 11/02/22 0737  . levETIRAcetam (KEPPRA) tablet 500 mg  500 mg Oral Q12H Massengill, Harrold Donath, MD   500 mg at 11/02/22 0735  . lisinopril (ZESTRIL) tablet 10 mg  10 mg Oral Daily Dahlia Byes C, NP   10 mg at 11/02/22 0735  . LORazepam (ATIVAN) tablet 2 mg  2 mg Oral TID PRN Earney Navy, NP  Or  . LORazepam (ATIVAN) injection 2 mg  2 mg Intramuscular TID PRN Dahlia Byes C, NP      . magnesium hydroxide (MILK OF MAGNESIA) suspension 30 mL  30 mL Oral Daily PRN Welford Roche, Josephine C, NP      . nicotine polacrilex (NICORETTE) gum 2 mg  2 mg Oral PRN Attiah, Nadir, MD      . risperiDONE (RISPERDAL M-TABS) disintegrating tablet 1 mg  1 mg Oral Daily Saida Lonon C, FNP   1 mg at 11/02/22 0735  . risperiDONE (RISPERDAL) tablet 2 mg  2 mg Oral QHS Grettell Ransdell, Jesusita Oka, FNP   2 mg at 11/01/22 2059  . traZODone (DESYREL) tablet 100 mg  100 mg Oral QHS Tameaka Eichhorn C, FNP   100 mg at 11/01/22 2059   Lab Results:  No results found for this or any previous visit (from the past 48 hour(s)).  Blood Alcohol level:  Lab Results  Component Value Date   ETH <10 10/27/2022   ETH <10 10/07/2022   Metabolic Disorder Labs: Lab Results  Component Value Date   HGBA1C 6.1 (H) 10/29/2022   MPG 128 10/29/2022   No results found for: "PROLACTIN" Lab Results  Component Value Date   CHOL 210 (H) 10/29/2022   TRIG 337 (H)  10/29/2022   HDL 50 10/29/2022   CHOLHDL 4.2 10/29/2022   VLDL 67 (H) 10/29/2022   LDLCALC 93 10/29/2022    Physical Findings: AIMS:  , ,  ,  ,    CIWA:    COWS:     Musculoskeletal: Strength & Muscle Tone: within normal limits Gait & Station: normal Patient leans: N/A  Psychiatric Specialty Exam:  Presentation  General Appearance:  Appropriate for Environment; Casual  Eye Contact: Good  Speech: Clear and Coherent; Normal Rate  Speech Volume: Normal  Handedness: Right  Mood and Affect  Mood: Anxious; Depressed  Affect: Congruent  Thought Process  Thought Processes: Coherent  Descriptions of Associations:Intact  Orientation:Full (Time, Place and Person)  Thought Content:Logical  History of Schizophrenia/Schizoaffective disorder:No  Duration of Psychotic Symptoms:No data recorded Hallucinations:Hallucinations: None Description of Visual Hallucinations: Denies  Ideas of Reference:None  Suicidal Thoughts:Suicidal Thoughts: No (Patient reports chronic suicidal ideation.  Reports attempting to hang himself in the bathroom yesterday)  Homicidal Thoughts:Homicidal Thoughts: No  Sensorium  Memory: Immediate Good; Recent Good  Judgment: Fair  Insight: Fair  Art therapist  Concentration: Good  Attention Span: Good  Recall: Fair  Fund of Knowledge: Fair  Language: Good  Psychomotor Activity  Psychomotor Activity: Psychomotor Activity: Normal  Assets  Assets: Communication Skills; Desire for Improvement; Physical Health; Resilience  Sleep  Sleep: Sleep: Good Number of Hours of Sleep: 7  Physical Exam: Physical Exam Vitals and nursing note reviewed.  HENT:     Head: Normocephalic.     Nose: Nose normal.     Mouth/Throat:     Mouth: Mucous membranes are moist.     Pharynx: Oropharynx is clear.  Eyes:     Pupils: Pupils are equal, round, and reactive to light.  Cardiovascular:     Rate and Rhythm: Normal rate.      Pulses: Normal pulses.  Pulmonary:     Effort: Pulmonary effort is normal.  Abdominal:     Comments: Deferred  Genitourinary:    Comments: Deferred Musculoskeletal:        General: Normal range of motion.     Cervical back: Normal range of motion.  Skin:  General: Skin is warm.  Neurological:     General: No focal deficit present.     Mental Status: He is alert and oriented to person, place, and time.  Psychiatric:        Mood and Affect: Mood normal.        Behavior: Behavior normal.   Review of Systems  Constitutional:  Negative for fever.  HENT: Negative.    Eyes:  Negative for blurred vision.  Respiratory:  Negative for shortness of breath.   Cardiovascular:  Negative for chest pain.  Gastrointestinal:  Negative for nausea and vomiting.  Genitourinary: Negative.   Musculoskeletal:  Negative for myalgias.  Skin: Negative.  Negative for rash.  Neurological:  Positive for seizures (Hx of seizures).  Endo/Heme/Allergies:        See allergy listing  Psychiatric/Behavioral:  Positive for depression. The patient is nervous/anxious and has insomnia.    Blood pressure 121/85, pulse (!) 119, temperature 98 F (36.7 C), temperature source Oral, resp. rate 16, height 6\' 1"  (1.854 m), weight 83 kg, SpO2 99 %. Body mass index is 24.14 kg/m.  Treatment Plan Summary: Daily contact with patient to assess and evaluate symptoms and progress in treatment and Medication management  Physician Treatment Plan for Primary Diagnosis:  Assessment: Major depressive disorder, recurrent severe without psychotic features (HCC) Generalized anxiety disorder PTSD Seizure disorder   Plan: Medications: Continue Prozac 20 mg p.o. daily for depression, may titrate to 40 mg p.o. starting tomorrow 10/30/2022 Continue hydroxyzine 50 mg p.o. every 6 hours as needed for anxiety Continue Risperdal 1 mg p.o. daily for mood stabilization Continue Risperdal 2 mg p.o. at bedtime for mood  stabilization Continue trazodone 100 mg p.o. at bedtime for insomnia Initiate prazosin 1 mg p.o. q. nightly for nightmares   Other medical problems: Albuterol Ventolin HFA 108 90 base MCG/ACT inhaler 2 puffs into the lungs every 4 hours as needed for wheezing or shortness of breath Keppra 500 mg tablet 1 p.o. 2 times daily for seizures activity Lisinopril 10 mg tablet by mouth daily for high blood pressure   Agitation protocol: Benadryl capsule 50 mg p.o. 3 times daily as needed agitation or Benadryl injection 50 mg IM 3 times daily as needed agitation   Haldol tablets 5 mg po 3 times daily as needed agitation or Haldol lactate injection 5 mg IM 3 times daily as needed agitation   Lorazepam tablet 2 mg p.o. 3 times daily as needed agitation or Lorazepam injection 2 mg IM 3 times daily as needed agitation   Other PRN Medications -Acetaminophen 650 mg every 6 as needed/mild pain -Maalox 30 mL oral every 4 as needed/digestion -Magnesium hydroxide 30 mL daily as needed/mild constipation --Nicotine gum 2 mg oral for smoking cessation   -- The risks/benefits/side-effects/alternatives to this medication were discussed in detail with the patient and time was given for questions. The patient consents to medication trial.              -- Encouraged patient to participate  in unit milieu and in scheduled group therapies    Safety and Monitoring: Voluntary admission to inpatient psychiatric unit for safety, stabilization and treatment Daily contact with patient to assess and evaluate symptoms and progress in treatment Patient's case to be discussed in multi-disciplinary team meeting Observation Level : q15 minute checks Vital signs: q12 hours Precautions: suicide, but pt currently verbally contracts for safety on unit    Discharge Planning: Social work and case management to assist with discharge  planning and identification of hospital follow-up needs prior to discharge Estimated LOS: 5-7  days Discharge Concerns: Need to establish a safety plan; Medication compliance and effectiveness Discharge Goals: Return home with outpatient referrals for mental health follow-up including medication management/psychotherapy.    Long Term Goal(s): Improvement in symptoms so as ready for discharge   Short Term Goals: Ability to identify changes in lifestyle to reduce recurrence of condition will improve, Ability to verbalize feelings will improve, Ability to disclose and discuss suicidal ideas, Ability to demonstrate self-control will improve, Ability to identify and develop effective coping behaviors will improve, Ability to maintain clinical measurements within normal limits will improve, Compliance with prescribed medications will improve, and Ability to identify triggers associated with substance abuse/mental health issues will improve   Physician Treatment Plan for Secondary Diagnosis: Principal Problem:   Major depressive disorder, recurrent severe without psychotic features (HCC)   I certify that inpatient services furnished can reasonably be expected to improve the patient's condition.     Cecilie Lowers, FNP 11/02/2022, 11:40 AMPatient ID: Raymond [name], male   DOB: 1980-07-31, 42 y.o.   MRN: [id number] Patient ID: Shelden Garguilo, male   DOB: 04-Aug-1980, 42 y.o.   MRN: [id number] Patient ID: Carlitos Mentel, male   DOB: [birth year], 42 y.o.   MRN: [id number]

## 2022-11-03 ENCOUNTER — Other Ambulatory Visit: Payer: Self-pay

## 2022-11-03 ENCOUNTER — Encounter (HOSPITAL_COMMUNITY): Payer: Self-pay

## 2022-11-03 NOTE — BH IP Treatment Plan (Signed)
Interdisciplinary Treatment and Diagnostic Plan Update  11/03/2022 Time of Session: 8:30am, update  Raymond [name] MRN: [id number]  Principal Diagnosis: Major depressive disorder, recurrent severe without psychotic features (HCC)  Secondary Diagnoses: Principal Problem:   Major depressive disorder, recurrent severe without psychotic features (HCC)   Current Medications:  Current Facility-Administered Medications  Medication Dose Route Frequency Provider Last Rate Last Admin   acetaminophen (TYLENOL) tablet 650 mg  650 mg Oral Q6H PRN Onuoha, Josephine C, NP       albuterol (VENTOLIN HFA) 108 (90 Base) MCG/ACT inhaler 2 puff  2 puff Inhalation Q4H PRN Dahlia Byes C, NP   2 puff at 11/03/22 0738   alum & mag hydroxide-simeth (MAALOX/MYLANTA) 200-200-20 MG/5ML suspension 30 mL  30 mL Oral Q4H PRN Dahlia Byes C, NP       diphenhydrAMINE (BENADRYL) capsule 50 mg  50 mg Oral TID PRN Earney Navy, NP       Or   diphenhydrAMINE (BENADRYL) injection 50 mg  50 mg Intramuscular TID PRN Earney Navy, NP       FLUoxetine (PROZAC) capsule 40 mg  40 mg Oral Daily Ntuen, Jesusita Oka, FNP   40 mg at 11/03/22 0737   haloperidol (HALDOL) tablet 5 mg  5 mg Oral TID PRN Earney Navy, NP       Or   haloperidol lactate (HALDOL) injection 5 mg  5 mg Intramuscular TID PRN Earney Navy, NP       hydrOXYzine (ATARAX) tablet 50 mg  50 mg Oral Q6H PRN Dahlia Byes C, NP   50 mg at 11/02/22 0737   levETIRAcetam (KEPPRA) tablet 500 mg  500 mg Oral Q12H Massengill, Harrold Donath, MD   500 mg at 11/03/22 0737   lisinopril (ZESTRIL) tablet 10 mg  10 mg Oral Daily Dahlia Byes C, NP   10 mg at 11/03/22 0738   LORazepam (ATIVAN) tablet 2 mg  2 mg Oral TID PRN Earney Navy, NP       Or   LORazepam (ATIVAN) injection 2 mg  2 mg Intramuscular TID PRN Dahlia Byes C, NP       magnesium hydroxide (MILK OF MAGNESIA) suspension 30 mL  30 mL Oral Daily PRN Dahlia Byes C, NP        nicotine polacrilex (NICORETTE) gum 2 mg  2 mg Oral PRN Abbott Pao, Nadir, MD   2 mg at 11/02/22 1210   prazosin (MINIPRESS) capsule 1 mg  1 mg Oral QHS Ntuen, Tina C, FNP   1 mg at 11/02/22 2100   risperiDONE (RISPERDAL M-TABS) disintegrating tablet 1 mg  1 mg Oral Daily Ntuen, Jesusita Oka, FNP   1 mg at 11/03/22 0737   risperiDONE (RISPERDAL) tablet 2 mg  2 mg Oral QHS Ntuen, Tina C, FNP   2 mg at 11/02/22 2100   traZODone (DESYREL) tablet 100 mg  100 mg Oral QHS Ntuen, Jesusita Oka, FNP   100 mg at 11/02/22 2100   PTA Medications: Medications Prior to Admission  Medication Sig Dispense Refill Last Dose   albuterol (VENTOLIN HFA) 108 (90 Base) MCG/ACT inhaler Inhale 2 puffs into the lungs every 4 (four) hours as needed for wheezing or shortness of breath. 6.7 g 0 10/28/2022   chlorproMAZINE (THORAZINE) 10 MG tablet Take 1 tablet (10 mg total) by mouth 3 (three) times daily. 30 tablet 0 10/27/2022   FLUoxetine (PROZAC) 20 MG capsule Take 1 capsule (20 mg total) by mouth daily. 10 capsule 0 10/27/2022  hydrOXYzine (ATARAX) 50 MG tablet Take 1 tablet (50 mg total) by mouth every 6 (six) hours as needed for anxiety. 10 tablet 0 10/28/2022   levETIRAcetam (KEPPRA) 500 MG tablet Take 1 tablet (500 mg total) by mouth 2 (two) times daily. 20 tablet 0 2-3 days ago   lisinopril (ZESTRIL) 10 MG tablet Take 1 tablet (10 mg total) by mouth daily. 10 tablet 0 10/28/2022   risperiDONE (RISPERDAL) 1 MG tablet Take 1 tablet (1 mg total) by mouth daily. 10 tablet 0 10/28/2022   risperiDONE (RISPERDAL) 3 MG tablet Take 1 tablet (3 mg total) by mouth at bedtime. 10 tablet 0 10/27/2022   traZODone (DESYREL) 150 MG tablet Take 1 tablet (150 mg total) by mouth at bedtime. 10 tablet 0 10/28/2022    Patient Stressors: Financial difficulties    Patient Strengths: Capable of independent living  Forensic psychologist fund of knowledge  Motivation for treatment/growth   Treatment Modalities: Medication Management, Group  therapy, Case management,  1 to 1 session with clinician, Psychoeducation, Recreational therapy.   Physician Treatment Plan for Primary Diagnosis: Major depressive disorder, recurrent severe without psychotic features (HCC) Long Term Goal(s): Improvement in symptoms so as ready for discharge   Short Term Goals: Ability to identify changes in lifestyle to reduce recurrence of condition will improve Ability to verbalize feelings will improve Ability to disclose and discuss suicidal ideas Ability to demonstrate self-control will improve Ability to identify and develop effective coping behaviors will improve Ability to maintain clinical measurements within normal limits will improve Compliance with prescribed medications will improve Ability to identify triggers associated with substance abuse/mental health issues will improve  Medication Management: Evaluate patient's response, side effects, and tolerance of medication regimen.  Therapeutic Interventions: 1 to 1 sessions, Unit Group sessions and Medication administration.  Evaluation of Outcomes: Progressing  Physician Treatment Plan for Secondary Diagnosis: Principal Problem:   Major depressive disorder, recurrent severe without psychotic features (HCC)  Long Term Goal(s): Improvement in symptoms so as ready for discharge   Short Term Goals: Ability to identify changes in lifestyle to reduce recurrence of condition will improve Ability to verbalize feelings will improve Ability to disclose and discuss suicidal ideas Ability to demonstrate self-control will improve Ability to identify and develop effective coping behaviors will improve Ability to maintain clinical measurements within normal limits will improve Compliance with prescribed medications will improve Ability to identify triggers associated with substance abuse/mental health issues will improve     Medication Management: Evaluate patient's response, side effects, and  tolerance of medication regimen.  Therapeutic Interventions: 1 to 1 sessions, Unit Group sessions and Medication administration.  Evaluation of Outcomes: Progressing   RN Treatment Plan for Primary Diagnosis: Major depressive disorder, recurrent severe without psychotic features (HCC) Long Term Goal(s): Knowledge of disease and therapeutic regimen to maintain health will improve  Short Term Goals: Ability to remain free from injury will improve, Ability to verbalize frustration and anger appropriately will improve, Ability to demonstrate self-control, Ability to participate in decision making will improve, Ability to verbalize feelings will improve, Ability to disclose and discuss suicidal ideas, Ability to identify and develop effective coping behaviors will improve, and Compliance with prescribed medications will improve  Medication Management: RN will administer medications as ordered by provider, will assess and evaluate patient's response and provide education to patient for prescribed medication. RN will report any adverse and/or side effects to prescribing provider.  Therapeutic Interventions: 1 on 1 counseling sessions, Psychoeducation, Medication administration, Evaluate responses  to treatment, Monitor vital signs and CBGs as ordered, Perform/monitor CIWA, COWS, AIMS and Fall Risk screenings as ordered, Perform wound care treatments as ordered.  Evaluation of Outcomes: Progressing   LCSW Treatment Plan for Primary Diagnosis: Major depressive disorder, recurrent severe without psychotic features (HCC) Long Term Goal(s): Safe transition to appropriate next level of care at discharge, Engage patient in therapeutic group addressing interpersonal concerns.  Short Term Goals: Engage patient in aftercare planning with referrals and resources, Increase social support, Increase ability to appropriately verbalize feelings, Increase emotional regulation, Facilitate acceptance of mental health  diagnosis and concerns, Facilitate patient progression through stages of change regarding substance use diagnoses and concerns, Identify triggers associated with mental health/substance abuse issues, and Increase skills for wellness and recovery  Therapeutic Interventions: Assess for all discharge needs, 1 to 1 time with Social worker, Explore available resources and support systems, Assess for adequacy in community support network, Educate family and significant other(s) on suicide prevention, Complete Psychosocial Assessment, Interpersonal group therapy.  Evaluation of Outcomes: Progressing   Progress in Treatment: Attending groups: Yes. Participating in groups: Yes. Taking medication as prescribed: Yes. Toleration medication: Yes. Family/Significant other contact made: No, will contact:  -Bradley Ferris friend [phone]   Arlon Tinkey dad [phone] Patient understands diagnosis: Yes. Discussing patient identified problems/goals with staff: Yes. Medical problems stabilized or resolved: Yes. Denies suicidal/homicidal ideation: Yes. Issues/concerns per patient self-inventory: No.   New problem(s) identified: No, Describe:  none reported   New Short Term/Long Term Goal(s):   medication stabilization, elimination of SI thoughts, development of comprehensive mental wellness plan.    Patient Goals:  Pt will continue to work on initial tx team goals.    Discharge Plan or Barriers:  Patient has follow up med management and therapy with [facility]   Reason for Continuation of Hospitalization: Anxiety Depression Medication stabilization Suicidal ideation  Estimated Length of Stay: 0-1 days  Last 3 [place] Suicide Severity Risk Score: Flowsheet Row Ad[facility] (Current) [facility] from 10/27/2022 in [facility] Department [facility] Admission (Discharged) from 10/08/2022 in [facility]PAT[facility]AL [facility]ICINE   C-SSRS RISK CATEGORY High Risk High Risk High Risk       Last PHQ 2/9 Scores:     No data to display          Scribe for Treatment Team: Beatris Si, LCSW 11/03/2022 10:02 AM

## 2022-11-03 NOTE — Group Note (Signed)
Date:  11/03/2022 Time:  10:21 AM  Group Topic/Focus:  Orientation:   The focus of this group is to educate the patient on the purpose and policies of crisis stabilization and provide a format to answer questions about their admission.  The group details unit policies and expectations of patients while admitted.    Participation Level:  Active  Participation Quality:  Appropriate  Affect:  Appropriate  Cognitive:  Appropriate  Insight: Appropriate  Engagement in Group:  Engaged  Modes of Intervention:  Discussion  Additional Comments:     Reymundo Poll 11/03/2022, 10:21 AM

## 2022-11-03 NOTE — Group Note (Signed)
Recreation Therapy Group Note   Group Topic:Team Building  Group Date: 11/03/2022 Start Time: 0930 End Time: 0957 Facilitators: Yoshiko Keleher-McCall, LRT,CTRS Location: [address] Dayroom   Goal Area(s) Addresses:  Patient will effectively work with peer towards shared goal.  Patient will identify skills used to make activity successful.  Patient will identify how skills used during activity can be applied to reach post d/c goals.   Group Description: [employer]. In teams of 5-6, patients were given 11 craft pipe cleaners. Using the materials provided, patients were instructed to compete again the opposing team(s) to build the tallest free-standing structure from floor level. The activity was timed; difficulty increased by Clinical research associate as Production designer, theatre/television/film continued.  Systematically resources were removed with additional directions for example, placing one arm behind their back, working in silence, and shape stipulations. LRT facilitated post-activity discussion reviewing team processes and necessary communication skills involved in completion. Patients were encouraged to reflect how the skills utilized, or not utilized, in this activity can be incorporated to positively impact support systems post discharge.   Affect/Mood: Appropriate   Participation Level: Engaged   Participation Quality: Independent   Behavior: Appropriate   Speech/Thought Process: Focused   Insight: Good   Judgement: Good   Modes of Intervention: STEM Activity   Patient Response to Interventions:  Engaged   Education Outcome:  Acknowledges education   Clinical Observations/Individualized Feedback: Pt attended and participated in group session.     Plan: Continue to engage patient in RT group sessions 2-3x/week.   Didier Brandenburg-McCall, LRT,CTRS  11/03/2022 12:35 PM

## 2022-11-03 NOTE — Plan of Care (Signed)
  Problem: Education: Goal: Knowledge of [facility] Education information/materials will improve Outcome: Progressing   Problem: Education: Goal: Verbalization of understanding the information provided will improve Outcome: Progressing   Problem: Coping: Goal: Ability to verbalize frustrations and anger appropriately will improve Outcome: Progressing   

## 2022-11-03 NOTE — Progress Notes (Incomplete)
[facility] MD Progress Note  11/03/2022 6:17 PM Raymond [name]  MRN:  [id number]  Subjective: Raymond [name] reports, "I attempted to hang myself in the bathroom yesterday in the evening."  Principal Problem: Major depressive disorder, recurrent severe without psychotic features (HCC) Diagnosis: Principal Problem:   Major depressive disorder, recurrent severe without psychotic features (HCC)  Reason for admission:  Raymond [name] is a 42 year old Caucasian male with prior self diagnosed psychiatric history of anxiety, panic attacks, depression, PTSD, who presents voluntarily to [facility] from [facility] ED after stabilization for worsening depression, anxiety, panic attack, resulting in suicidal ideation by intentional overdosing on 7-8, 500 mg Keppra and self-mutilation.   24-hour chart Review: Past 24 hours of patient's chart was reviewed.  Patient is compliant with scheduled meds. Required Agitation PRNs: none As needed medications: Hydroxyzine 25 mg p.o. administered at 0737 today for anxiety and albuterol given at 2042 yesterday and 0734 today for wheezing. Per RN notes, no documented behavioral issues and is attending group. Patient slept, 7 hours.    Today's assessment notes: Patient seen and examined on [address] sitting up in a chair.  He presents alert and oriented to person, place, time & situation.  Mood is brighter, euthymic, and observed in the day room playing games with the other patients.  He is very interactive and appears to be enjoying playing cards with other patients in the day room.  He reports history of chronic suicidal ideations, and made this provider aware that he attempted to hang himself in the bathroom yesterday in the evening.  However, he did not report this incident to any staff.  Able to contract for safety while in the hospital.  15 minutes safety monitoring continues for safety.  Reports sleeping over 7 hours last night and having  nightmares.  Prazosin 1 mg p.o. q. nightly initiated.  Patient rates depression as #7/10 and anxiety as #6/10, with 10 being highest severity. Encouraged to obtain as needed for anxiety from nursing staff.  He continues on his scheduled antidepressant of Prozac 40 mg p.o. daily and Risperdal 1 mg p.o. in a.m. and 2 mg p.o. at bedtime for mood stabilization.  Denies somatic discomfort.  Hygiene appears improved.   Denies acute discomfort.  Going to the cafeteria with the patient and eating all his meals.  He denies SI, HI or AVH.  No further episode of anxiety attack observed.   Will continue current treatment plan with adjustment as indicated below as indicated below.   Total Time spent with patient: 30 minutes  Past Psychiatric History: Previous Psych Diagnoses: Major depressive disorder, recurrent severe without psychotic features, amphetamine abuse, cocaine abuse, substance-induced mood disorder, with suicide attempt.  Prior inpatient treatment: Admitted to [facility] behavioral health in [place] in 2020 for suicide attempt for 1 month.  Current/prior outpatient treatment: None, has scheduled appointment on June 2024 at 12 noon with Raymond [name] Prior rehab hx: Rehab for alcohol x 1 month in 2019 Psychotherapy hx: Yes History of suicide: X 4 in the last 3 days, another suicide attempt in 2020 History of homicide or aggression: Denies Psychiatric medication history: Thorazine, fluoxetine, hydroxyzine, risperidone, and trazodone. Psychiatric medication compliance history: Noncompliance Neuromodulation history: Denies  Current Psychiatrist: Denies Current therapist: Seeing Raymond [name] at the [facility]  Past Medical History:  Past Medical History:  Diagnosis Date  . Anxiety   . Hepatitis [name]   . PTSD (post-traumatic stress disorder)     Past Surgical History:  Procedure  Laterality Date  . FACIAL FRACTURE SURGERY     metal plate under right eye   Family History: History reviewed.  No pertinent family history. Family Psychiatric  History: HPI Social History:  Social History   Substance and Sexual Activity  Alcohol Use Not Currently     Social History   Substance and Sexual Activity  Drug Use Not Currently    Social History   Socioeconomic History  . Marital status: Single    Spouse name: Not on file  . Number of children: Not on file  . Years of education: Not on file  . Highest education level: Not on file  Occupational History  . Not on file  Tobacco Use  . Smoking status: Some Days    Types: Cigarettes  . Smokeless tobacco: Never  Vaping Use  . Vaping Use: Never used  Substance and Sexual Activity  . Alcohol use: Not Currently  . Drug use: Not Currently  . Sexual activity: Not on file  Other Topics Concern  . Not on file  Social History Narrative  . Not on file   Social Determinants of Health   Financial Resource Strain: Not on file  Food Insecurity: Food Insecurity Present (10/28/2022)   Hunger Vital Sign   . Worried About Programme researcher, broadcasting/film/video in the Last Year: Sometimes true   . Ran Out of Food in the Last Year: Sometimes true  Transportation Needs: No Transportation Needs (10/28/2022)   PRAPARE - Transportation   . Lack of Transportation (Medical): No   . Lack of Transportation (Non-Medical): No  Physical Activity: Not on file  Stress: Not on file  Social Connections: Not on file   Additional Social History:    Sleep: Good  Appetite:  Good  Current Medications: Current Facility-Administered Medications  Medication Dose Route Frequency Provider Last Rate Last Admin  . acetaminophen (TYLENOL) tablet 650 mg  650 mg Oral Q6H PRN [name], Raymond [name], Raymond [name]      . albuterol (VENTOLIN HFA) 108 (90 Base) MCG/ACT inhaler 2 puff  2 puff Inhalation Q4H PRN Raymond Byes [name], Raymond [name]   2 puff at 11/03/22 1238  . alum & mag hydroxide-simeth (MAALOX/MYLANTA) 200-200-20 MG/5ML suspension 30 mL  30 mL Oral Q4H PRN [name], Raymond [name], Raymond [name]      .  diphenhydrAMINE (BENADRYL) capsule 50 mg  50 mg Oral TID PRN Raymond Byes [name], Raymond [name]       Or  . diphenhydrAMINE (BENADRYL) injection 50 mg  50 mg Intramuscular TID PRN Raymond Byes [name], Raymond [name]      . FLUoxetine (PROZAC) capsule 40 mg  40 mg Oral Daily Alee Katen [name], Raymond [name]   40 mg at 11/03/22 0737  . haloperidol (HALDOL) tablet 5 mg  5 mg Oral TID PRN Raymond Byes [name], Raymond [name]       Or  . haloperidol lactate (HALDOL) injection 5 mg  5 mg Intramuscular TID PRN Raymond Byes [name], Raymond [name]      . hydrOXYzine (ATARAX) tablet 50 mg  50 mg Oral Q6H PRN Raymond Byes [name], Raymond [name]   50 mg at 11/02/22 0737  . levETIRAcetam (KEPPRA) tablet 500 mg  500 mg Oral Q12H [name], Raymond Donath, MD   500 mg at 11/03/22 0737  . lisinopril (ZESTRIL) tablet 10 mg  10 mg Oral Daily Raymond Byes [name], Raymond [name]   10 mg at 11/03/22 0738  . LORazepam (ATIVAN) tablet 2 mg  2 mg Oral TID PRN Raymond Navy, Raymond [name]  Or  . LORazepam (ATIVAN) injection 2 mg  2 mg Intramuscular TID PRN Raymond Byes [name], Raymond [name]      . magnesium hydroxide (MILK OF MAGNESIA) suspension 30 mL  30 mL Oral Daily PRN Raymond Byes [name], Raymond [name]      . nicotine polacrilex (NICORETTE) gum 2 mg  2 mg Oral PRN Abbott Pao, Nadir, MD   2 mg at 11/02/22 1210  . prazosin (MINIPRESS) capsule 1 mg  1 mg Oral QHS Raymond Huffstetler [name], Raymond [name]   1 mg at 11/02/22 2100  . risperiDONE (RISPERDAL M-TABS) disintegrating tablet 1 mg  1 mg Oral Daily Raymond [name], Raymond [name], Raymond [name]   1 mg at 11/03/22 0737  . risperiDONE (RISPERDAL) tablet 2 mg  2 mg Oral QHS Raymond [name], Raymond [name], Raymond [name]   2 mg at 11/02/22 2100  . traZODone (DESYREL) tablet 100 mg  100 mg Oral QHS Taijon Vink [name], Raymond [name]   100 mg at 11/02/22 2100   Lab Results:  No results found for this or any previous visit (from the past 48 hour(s)).  Blood Alcohol level:  Lab Results  Component Value Date   ETH <10 10/27/2022   ETH <10 10/07/2022   Metabolic Disorder Labs: Lab Results  Component Value Date   HGBA1C 6.1 (H) 10/29/2022   MPG 128 10/29/2022   No  results found for: "PROLACTIN" Lab Results  Component Value Date   CHOL 210 (H) 10/29/2022   TRIG 337 (H) 10/29/2022   HDL 50 10/29/2022   CHOLHDL 4.2 10/29/2022   VLDL 67 (H) 10/29/2022   LDLCALC 93 10/29/2022    Physical Findings: AIMS:  , ,  ,  ,    CIWA:    COWS:     Musculoskeletal: Strength & Muscle Tone: within normal limits Gait & Station: normal Patient leans: N/A  Psychiatric Specialty Exam:  Presentation  General Appearance:  Appropriate for Environment; Casual; Fairly Groomed  Eye Contact: Good  Speech: Clear and Coherent; Normal Rate  Speech Volume: Normal  Handedness: Right  Mood and Affect  Mood: Anxious; Depressed  Affect: Congruent  Thought Process  Thought Processes: Coherent; Linear  Descriptions of Associations:Intact  Orientation:Full (Time, Place and Person)  Thought Content:Logical  History of Schizophrenia/Schizoaffective disorder:No  Duration of Psychotic Symptoms:No data recorded Hallucinations:Hallucinations: None Description of Visual Hallucinations: denies  Ideas of Reference:None  Suicidal Thoughts:Suicidal Thoughts: No  Homicidal Thoughts:Homicidal Thoughts: No  Sensorium  Memory: Immediate Good; Recent Good  Judgment: Fair  Insight: Good  Executive Functions  Concentration: Good  Attention Span: Good  Recall: Fair  Fund of Knowledge: Fair  Language: Good  Psychomotor Activity  Psychomotor Activity: Psychomotor Activity: Normal  Assets  Assets: Communication Skills; Desire for Improvement; Physical Health; Resilience  Sleep  Sleep: Sleep: Good Number of Hours of Sleep: 8  Physical Exam: Physical Exam Vitals and nursing note reviewed.  HENT:     Head: Normocephalic.     Nose: Nose normal.     Mouth/Throat:     Mouth: Mucous membranes are moist.     Pharynx: Oropharynx is clear.  Eyes:     Pupils: Pupils are equal, round, and reactive to light.  Cardiovascular:     Rate  and Rhythm: Normal rate.     Pulses: Normal pulses.  Pulmonary:     Effort: Pulmonary effort is normal.  Abdominal:     Comments: Deferred  Genitourinary:    Comments: Deferred Musculoskeletal:        General: Normal range of motion.  Cervical back: Normal range of motion.  Skin:    General: Skin is warm.  Neurological:     General: No focal deficit present.     Mental Status: He is alert and oriented to person, place, and time.  Psychiatric:        Mood and Affect: Mood normal.        Behavior: Behavior normal.    Review of Systems  Constitutional:  Negative for fever.  HENT: Negative.    Eyes:  Negative for blurred vision.  Respiratory:  Negative for shortness of breath.   Cardiovascular:  Negative for chest pain.  Gastrointestinal:  Negative for nausea and vomiting.  Genitourinary: Negative.   Musculoskeletal:  Negative for myalgias.  Skin: Negative.  Negative for rash.  Neurological:  Positive for seizures (Hx of seizures).  Endo/Heme/Allergies:        See allergy listing  Psychiatric/Behavioral:  Positive for depression. The patient is nervous/anxious and has insomnia.    Blood pressure 129/88, pulse 97, temperature 97.7 F (36.5 [name]), temperature source Oral, resp. rate 12, height 6\' 1"  (1.854 m), weight 83 kg, SpO2 98 %. Body mass index is 24.14 kg/m.  Treatment Plan Summary: Daily contact with patient to assess and evaluate symptoms and progress in treatment and Medication management  Physician Treatment Plan for Primary Diagnosis:  Assessment: Major depressive disorder, recurrent severe without psychotic features (HCC) Generalized anxiety disorder PTSD Seizure disorder   Plan: Medications: Continue Prozac 20 mg p.o. daily for depression, may titrate to 40 mg p.o. starting tomorrow 10/30/2022 Continue hydroxyzine 50 mg p.o. every 6 hours as needed for anxiety Continue Risperdal 1 mg p.o. daily for mood stabilization Continue Risperdal 2 mg p.o. at  bedtime for mood stabilization Continue trazodone 100 mg p.o. at bedtime for insomnia Initiate prazosin 1 mg p.o. q. nightly for nightmares   Other medical problems: Albuterol Ventolin HFA 108 90 base MCG/ACT inhaler 2 puffs into the lungs every 4 hours as needed for wheezing or shortness of breath Keppra 500 mg tablet 1 p.o. 2 times daily for seizures activity Lisinopril 10 mg tablet by mouth daily for high blood pressure   Agitation protocol: Benadryl capsule 50 mg p.o. 3 times daily as needed agitation or Benadryl injection 50 mg IM 3 times daily as needed agitation   Haldol tablets 5 mg po 3 times daily as needed agitation or Haldol lactate injection 5 mg IM 3 times daily as needed agitation   Lorazepam tablet 2 mg p.o. 3 times daily as needed agitation or Lorazepam injection 2 mg IM 3 times daily as needed agitation   Other PRN Medications -Acetaminophen 650 mg every 6 as needed/mild pain -Maalox 30 mL oral every 4 as needed/digestion -Magnesium hydroxide 30 mL daily as needed/mild constipation --Nicotine gum 2 mg oral for smoking cessation   -- The risks/benefits/side-effects/alternatives to this medication were discussed in detail with the patient and time was given for questions. The patient consents to medication trial.              -- Encouraged patient to participate  in unit milieu and in scheduled group therapies    Safety and Monitoring: Voluntary admission to inpatient psychiatric unit for safety, stabilization and treatment Daily contact with patient to assess and evaluate symptoms and progress in treatment Patient's case to be discussed in multi-disciplinary team meeting Observation Level : q15 minute checks Vital signs: q12 hours Precautions: suicide, but pt currently verbally contracts for safety on unit  Discharge Planning: Social work and case management to assist with discharge planning and identification of hospital follow-up needs prior to  discharge Estimated LOS: 5-7 days Discharge Concerns: Need to establish a safety plan; Medication compliance and effectiveness Discharge Goals: Return home with outpatient referrals for mental health follow-up including medication management/psychotherapy.    Long Term Goal(s): Improvement in symptoms so as ready for discharge   Short Term Goals: Ability to identify changes in lifestyle to reduce recurrence of condition will improve, Ability to verbalize feelings will improve, Ability to disclose and discuss suicidal ideas, Ability to demonstrate self-control will improve, Ability to identify and develop effective coping behaviors will improve, Ability to maintain clinical measurements within normal limits will improve, Compliance with prescribed medications will improve, and Ability to identify triggers associated with substance abuse/mental health issues will improve   Physician Treatment Plan for Secondary Diagnosis: Principal Problem:   Major depressive disorder, recurrent severe without psychotic features (HCC)   I certify that inpatient services furnished can reasonably be expected to improve the patient's condition.     Cecilie Lowers, Raymond [name] 11/03/2022, 6:17 PMPatient ID: Raymond [name], male   DOB: 06/28/1980, 42 y.o.   MRN: [id number] Patient ID: Aristotelis Giovanelli, male   DOB: [birth year], 42 y.o.   MRN: [id number] Patient ID: Willys Wadding, male   DOB: [birth year], 42 y.o.   MRN: [id number] Patient ID: Allie Kubacki, male   DOB: 07-04-1980, 42 y.o.   MRN: [id number]

## 2022-11-03 NOTE — Progress Notes (Signed)
Psychoeducational Group Note  Date:  11/03/2022 Time:  2126  Group Topic/Focus:  Relapse Prevention Planning:   The focus of this group is to define relapse and discuss the need for planning to combat relapse.  Participation Level: Did Not Attend  Participation Quality:  Not Applicable  Affect:  Not Applicable  Cognitive:  Not Applicable  Insight:  Not Applicable  Engagement in Group: Not Applicable  Additional Comments:  The patient did not attend group this evening.   Hazle Coca S 11/03/2022, 9:28 PM

## 2022-11-03 NOTE — Progress Notes (Signed)
Pt denied SI/HI/AVH this morning. Pt rated his depression a 5/10, anxiety a 5/10, and feelings of hopelessness a 5/10. Pt reports that he didn't sleep well last night due to nightmares. Pt has been pleasant, calm, and cooperative throughout the shift. RN provided support and encouragement to patient. Pt given scheduled medications as prescribed. Q15 min checks verified for safety. Patient verbally contracts for safety. Patient compliant with medications and treatment plan. Patient is interacting well on the unit. Pt is safe on the unit.   11/03/22 0826  Psych Admission Type (Psych Patients Only)  Admission Status Voluntary  Psychosocial Assessment  Patient Complaints Anxiety;Depression;Sleep disturbance ("Nightmares")  Eye Contact Fair  Facial Expression Anxious  Affect Anxious  Speech Logical/coherent  Interaction Assertive  Motor Activity Fidgety  Appearance/Hygiene Unremarkable  Behavior Characteristics Cooperative;Anxious  Mood Anxious;Depressed  Thought Process  Coherency WDL  Content WDL  Delusions None reported or observed  Perception WDL  Hallucination None reported or observed  Judgment Limited  Confusion None  Danger to Self  Current suicidal ideation? Denies  Self-Injurious Behavior No self-injurious ideation or behavior indicators observed or expressed   Agreement Not to Harm Self Yes  Description of Agreement Pt verbally contracts for safety  Danger to Others  Danger to Others None reported or observed

## 2022-11-03 NOTE — Group Note (Signed)
Occupational Therapy Group Note  Group Topic: Sleep Hygiene  Group Date: 11/03/2022 Start Time: 1430 End Time: 1505 Facilitators: Amaad Byers G, OT   Group Description: Group encouraged increased participation and engagement through topic focused on sleep hygiene. Patients reflected on the quality of sleep they typically receive and identified areas that need improvement. Group was given background information on sleep and sleep hygiene, including common sleep disorders. Group members also received information on how to improve one's sleep and introduced a sleep diary as a tool that can be utilized to track sleep quality over a length of time. Group session ended with patients identifying one or more strategies they could utilize or implement into their sleep routine in order to improve overall sleep quality.        Therapeutic Goal(s):  Identify one or more strategies to improve overall sleep hygiene  Identify one or more areas of sleep that are negatively impacted (sleep too much, too little, etc)     Participation Level: Engaged   Participation Quality: Independent   Behavior: Appropriate   Speech/Thought Process: Relevant   Affect/Mood: Appropriate   Insight: Fair   Judgement: Fair      Modes of Intervention: Education  Patient Response to Interventions:  Attentive   Plan: Continue to engage patient in OT groups 2 - 3x/week.  11/03/2022  Saleen Peden G Daruis Swaim, OT  Krissy Orebaugh, OT   

## 2022-11-03 NOTE — Progress Notes (Signed)
[facility] MD Progress Note  11/03/2022 6:27 PM Raymond [name]  MRN:  [id number]  Principal Problem: Major depressive disorder, recurrent severe without psychotic features (HCC) Diagnosis: Principal Problem:   Major depressive disorder, recurrent severe without psychotic features [facility])  Reason for admission:  Raymond [name] is a 42 year old Caucasian male with prior self diagnosed psychiatric history of anxiety, panic attacks, depression, PTSD, who presents voluntarily to [facility] from [facility] ED after stabilization for worsening depression, anxiety, panic attack, resulting in suicidal ideation by intentional overdosing on 7-8, 500 mg Keppra and self-mutilation.   24-hour chart Review: Past 24 hours of patient's chart was reviewed.  Patient is compliant with scheduled meds. Required Agitation PRNs: none As needed medications: Albuterol given at 0738 and 1238 today for wheezing Nicorette gum given at 1210 yesterday for cigarette cessation. Per RN notes, no documented behavioral issues and is attending group. Patient slept, 8 hours.    Today's assessment notes: Patient seen and examined on [address] sitting up in a chair.  He presents alert and oriented to person, place, time & situation.  Mood is brighter, euthymic, and observed in the day room interacting with the other patients and playing games. He reports history of chronic suicidal ideations, however, denies suicidal ideation today during examination by this provider.  Patient reported to other staff that he experiences suicidal ideation and if discharge today that he will commit suicide.  Reports having panic attack earlier in the day however did not request for hydroxyzine for anxiety.  Patient is very inconsistent with his presentations.  Able to contract for safety while in the hospital. 15 minutes safety monitoring continues for safety.  Reports sleeping over 8 hours last night and having nightmares. Continues  on prazosin 1 mg p.o. q. nightly. Patient rates depression as #5/10 and anxiety as #5/10, with 10 being highest severity. Encouraged to obtain as needed for anxiety from nursing staff.  He continues on his scheduled antidepressant of Prozac 40 mg p.o. daily and Risperdal 1 mg p.o. in a.m. and 2 mg p.o. at bedtime for mood stabilization.  Denies somatic discomfort.  Hygiene appears improved.   Denies acute discomfort.  Going to the cafeteria with the patient and eating all his meals.  He denies SI, HI or AVH.  No further episode of anxiety attack observed.   Will continue current treatment plan with adjustment as indicated below as indicated below.   Total Time spent with patient: 30 minutes  Past Psychiatric History: Previous Psych Diagnoses: Major depressive disorder, recurrent severe without psychotic features, amphetamine abuse, cocaine abuse, substance-induced mood disorder, with suicide attempt.  Prior inpatient treatment: Admitted to [facility] behavioral health in [place] in 2020 for suicide attempt for 1 month.  Current/prior outpatient treatment: None, has scheduled appointment on June 2024 at 12 noon with [place] Prior rehab hx: Rehab for alcohol x 1 month in 2019 Psychotherapy hx: Yes History of suicide: X 4 in the last 3 days, another suicide attempt in 2020 History of homicide or aggression: Denies Psychiatric medication history: Thorazine, fluoxetine, hydroxyzine, risperidone, and trazodone. Psychiatric medication compliance history: Noncompliance Neuromodulation history: Denies  Current Psychiatrist: Denies Current therapist: Seeing Graciella Freer at the [facility]  Past Medical History:  Past Medical History:  Diagnosis Date   Anxiety    Hepatitis C    PTSD (post-traumatic stress disorder)     Past Surgical History:  Procedure Laterality Date   FACIAL FRACTURE SURGERY     metal plate under  right eye   Family History: History reviewed. No pertinent family  history. Family Psychiatric  History: HPI Social History:  Social History   Substance and Sexual Activity  Alcohol Use Not Currently     Social History   Substance and Sexual Activity  Drug Use Not Currently    Social History   Socioeconomic History   Marital status: Single    Spouse name: Not on file   Number of children: Not on file   Years of education: Not on file   Highest education level: Not on file  Occupational History   Not on file  Tobacco Use   Smoking status: Some Days    Types: Cigarettes   Smokeless tobacco: Never  Vaping Use   Vaping Use: Never used  Substance and Sexual Activity   Alcohol use: Not Currently   Drug use: Not Currently   Sexual activity: Not on file  Other Topics Concern   Not on file  Social History Narrative   Not on file   Social Determinants of Health   Financial Resource Strain: Not on file  Food Insecurity: Food Insecurity Present (10/28/2022)   Hunger Vital Sign    Worried About Running Out of Food in the Last Year: Sometimes true    Ran Out of Food in the Last Year: Sometimes true  Transportation Needs: No Transportation Needs (10/28/2022)   PRAPARE - Administrator, Civil Service (Medical): No    Lack of Transportation (Non-Medical): No  Physical Activity: Not on file  Stress: Not on file  Social Connections: Not on file   Additional Social History:    Sleep: Good  Appetite:  Good  Current Medications: Current Facility-Administered Medications  Medication Dose Route Frequency Provider Last Rate Last Admin   acetaminophen (TYLENOL) tablet 650 mg  650 mg Oral Q6H PRN Onuoha, Josephine C, NP       albuterol (VENTOLIN HFA) 108 (90 Base) MCG/ACT inhaler 2 puff  2 puff Inhalation Q4H PRN Dahlia Byes C, NP   2 puff at 11/03/22 1238   alum & mag hydroxide-simeth (MAALOX/MYLANTA) 200-200-20 MG/5ML suspension 30 mL  30 mL Oral Q4H PRN Dahlia Byes C, NP       diphenhydrAMINE (BENADRYL) capsule 50 mg  50  mg Oral TID PRN Dahlia Byes C, NP       Or   diphenhydrAMINE (BENADRYL) injection 50 mg  50 mg Intramuscular TID PRN Dahlia Byes C, NP       FLUoxetine (PROZAC) capsule 40 mg  40 mg Oral Daily Cleophas Yoak C, FNP   40 mg at 11/03/22 0737   haloperidol (HALDOL) tablet 5 mg  5 mg Oral TID PRN Dahlia Byes C, NP       Or   haloperidol lactate (HALDOL) injection 5 mg  5 mg Intramuscular TID PRN Dahlia Byes C, NP       hydrOXYzine (ATARAX) tablet 50 mg  50 mg Oral Q6H PRN Dahlia Byes C, NP   50 mg at 11/02/22 0737   levETIRAcetam (KEPPRA) tablet 500 mg  500 mg Oral Q12H Massengill, Harrold Donath, MD   500 mg at 11/03/22 0737   lisinopril (ZESTRIL) tablet 10 mg  10 mg Oral Daily Dahlia Byes C, NP   10 mg at 11/03/22 0738   LORazepam (ATIVAN) tablet 2 mg  2 mg Oral TID PRN Dahlia Byes C, NP       Or   LORazepam (ATIVAN) injection 2 mg  2 mg Intramuscular TID  PRN Earney Navy, NP       magnesium hydroxide (MILK OF MAGNESIA) suspension 30 mL  30 mL Oral Daily PRN Dahlia Byes C, NP       nicotine polacrilex (NICORETTE) gum 2 mg  2 mg Oral PRN Abbott Pao, Nadir, MD   2 mg at 11/02/22 1210   prazosin (MINIPRESS) capsule 1 mg  1 mg Oral QHS Malloree Raboin C, FNP   1 mg at 11/02/22 2100   risperiDONE (RISPERDAL M-TABS) disintegrating tablet 1 mg  1 mg Oral Daily Elleanna Melling, Jesusita Oka, FNP   1 mg at 11/03/22 0737   risperiDONE (RISPERDAL) tablet 2 mg  2 mg Oral QHS Angelly Spearing C, FNP   2 mg at 11/02/22 2100   traZODone (DESYREL) tablet 100 mg  100 mg Oral QHS Lesly Joslyn, Inetta Fermo C, FNP   100 mg at 11/02/22 2100   Lab Results:  No results found for this or any previous visit (from the past 48 hour(s)).  Blood Alcohol level:  Lab Results  Component Value Date   ETH <10 10/27/2022   ETH <10 10/07/2022   Metabolic Disorder Labs: Lab Results  Component Value Date   HGBA1C 6.1 (H) 10/29/2022   MPG 128 10/29/2022   No results found for: "PROLACTIN" Lab Results  Component Value  Date   CHOL 210 (H) 10/29/2022   TRIG 337 (H) 10/29/2022   HDL 50 10/29/2022   CHOLHDL 4.2 10/29/2022   VLDL 67 (H) 10/29/2022   LDLCALC 93 10/29/2022    Physical Findings: AIMS:  , ,  ,  ,    CIWA:    COWS:     Musculoskeletal: Strength & Muscle Tone: within normal limits Gait & Station: normal Patient leans: N/A  Psychiatric Specialty Exam:  Presentation  General Appearance:  Appropriate for Environment; Casual; Fairly Groomed  Eye Contact: Good  Speech: Clear and Coherent; Normal Rate  Speech Volume: Normal  Handedness: Right  Mood and Affect  Mood: Anxious; Depressed  Affect: Congruent  Thought Process  Thought Processes: Coherent; Linear  Descriptions of Associations:Intact  Orientation:Full (Time, Place and Person)  Thought Content:Logical  History of Schizophrenia/Schizoaffective disorder:No  Duration of Psychotic Symptoms:No data recorded Hallucinations:Hallucinations: None Description of Visual Hallucinations: denies  Ideas of Reference:None  Suicidal Thoughts:Suicidal Thoughts: No  Homicidal Thoughts:Homicidal Thoughts: No  Sensorium  Memory: Immediate Good; Recent Good  Judgment: Fair  Insight: Good  Executive Functions  Concentration: Good  Attention Span: Good  Recall: Fair  Fund of Knowledge: Fair  Language: Good  Psychomotor Activity  Psychomotor Activity: Psychomotor Activity: Normal  Assets  Assets: Communication Skills; Desire for Improvement; Physical Health; Resilience  Sleep  Sleep: Sleep: Good Number of Hours of Sleep: 8  Physical Exam: Physical Exam Vitals and nursing note reviewed.  HENT:     Head: Normocephalic.     Nose: Nose normal.     Mouth/Throat:     Mouth: Mucous membranes are moist.     Pharynx: Oropharynx is clear.  Eyes:     Pupils: Pupils are equal, round, and reactive to light.  Cardiovascular:     Rate and Rhythm: Normal rate.     Pulses: Normal pulses.   Pulmonary:     Effort: Pulmonary effort is normal.  Abdominal:     Comments: Deferred  Genitourinary:    Comments: Deferred Musculoskeletal:        General: Normal range of motion.     Cervical back: Normal range of motion.  Skin:  General: Skin is warm.  Neurological:     General: No focal deficit present.     Mental Status: He is alert and oriented to person, place, and time.  Psychiatric:        Mood and Affect: Mood normal.        Behavior: Behavior normal.    Review of Systems  Constitutional:  Negative for fever.  HENT: Negative.    Eyes:  Negative for blurred vision.  Respiratory:  Negative for shortness of breath.   Cardiovascular:  Negative for chest pain.  Gastrointestinal:  Negative for nausea and vomiting.  Genitourinary: Negative.   Musculoskeletal:  Negative for myalgias.  Skin: Negative.  Negative for rash.  Neurological:  Positive for seizures (Hx of seizures).  Endo/Heme/Allergies:        See allergy listing  Psychiatric/Behavioral:  Positive for depression. The patient is nervous/anxious.    Blood pressure 129/88, pulse 97, temperature 97.7 F (36.5 C), temperature source Oral, resp. rate 12, height 6\' 1"  (1.854 m), weight 83 kg, SpO2 98 %. Body mass index is 24.14 kg/m.  Treatment Plan Summary: Daily contact with patient to assess and evaluate symptoms and progress in treatment and Medication management  Physician Treatment Plan for Primary Diagnosis:  Assessment: Major depressive disorder, recurrent severe without psychotic features (HCC) Generalized anxiety disorder PTSD Seizure disorder   Plan: Medications: Continue Prozac 20 mg p.o. daily for depression, may titrate to 40 mg p.o. starting tomorrow 10/30/2022 Continue hydroxyzine 50 mg p.o. every 6 hours as needed for anxiety Continue Risperdal 1 mg p.o. daily for mood stabilization Continue Risperdal 2 mg p.o. at bedtime for mood stabilization Continue trazodone 100 mg p.o. at bedtime  for insomnia Continue prazosin 1 mg p.o. q. nightly for nightmares   Other medical problems: Albuterol Ventolin HFA 108 90 base MCG/ACT inhaler 2 puffs into the lungs every 4 hours as needed for wheezing or shortness of breath Keppra 500 mg tablet 1 p.o. 2 times daily for seizures activity Lisinopril 10 mg tablet by mouth daily for high blood pressure   Agitation protocol: Benadryl capsule 50 mg p.o. 3 times daily as needed agitation or Benadryl injection 50 mg IM 3 times daily as needed agitation   Haldol tablets 5 mg po 3 times daily as needed agitation or Haldol lactate injection 5 mg IM 3 times daily as needed agitation   Lorazepam tablet 2 mg p.o. 3 times daily as needed agitation or Lorazepam injection 2 mg IM 3 times daily as needed agitation   Other PRN Medications -Acetaminophen 650 mg every 6 as needed/mild pain -Maalox 30 mL oral every 4 as needed/digestion -Magnesium hydroxide 30 mL daily as needed/mild constipation --Nicotine gum 2 mg oral for smoking cessation   -- The risks/benefits/side-effects/alternatives to this medication were discussed in detail with the patient and time was given for questions. The patient consents to medication trial.              -- Encouraged patient to participate  in unit milieu and in scheduled group therapies    Safety and Monitoring: Voluntary admission to inpatient psychiatric unit for safety, stabilization and treatment Daily contact with patient to assess and evaluate symptoms and progress in treatment Patient's case to be discussed in multi-disciplinary team meeting Observation Level : q15 minute checks Vital signs: q12 hours Precautions: suicide, but pt currently verbally contracts for safety on unit    Discharge Planning: Social work and case management to assist with discharge planning and identification  of hospital follow-up needs prior to discharge Estimated LOS: 5-7 days Discharge Concerns: Need to establish a safety  plan; Medication compliance and effectiveness Discharge Goals: Return home with outpatient referrals for mental health follow-up including medication management/psychotherapy.    Long Term Goal(s): Improvement in symptoms so as ready for discharge   Short Term Goals: Ability to identify changes in lifestyle to reduce recurrence of condition will improve, Ability to verbalize feelings will improve, Ability to disclose and discuss suicidal ideas, Ability to demonstrate self-control will improve, Ability to identify and develop effective coping behaviors will improve, Ability to maintain clinical measurements within normal limits will improve, Compliance with prescribed medications will improve, and Ability to identify triggers associated with substance abuse/mental health issues will improve   Physician Treatment Plan for Secondary Diagnosis: Principal Problem:   Major depressive disorder, recurrent severe without psychotic features (HCC)   I certify that inpatient services furnished can reasonably be expected to improve the patient's condition.     Cecilie Lowers, FNP 11/03/2022, 6:27 PMPatient ID: [name], male   DOB: [birth year], 42 y.o.   MRN: [id number] Patient ID: Jerrid Cabbage, male   DOB: [birth year], 42 y.o.   MRN: [id number] Patient ID: Landric Gumz, male   DOB: Jul 01, 1980, 42 y.o.   MRN: [id number] Patient ID: Thaddeaus Jone, male   DOB: 11/28/1980, 42 y.o.   MRN: [id number]

## 2022-11-03 NOTE — Progress Notes (Signed)
   11/02/22 2330  Psych Admission Type (Psych Patients Only)  Admission Status Voluntary  Psychosocial Assessment  Patient Complaints Anxiety  Eye Contact Fair  Facial Expression Anxious  Affect Appropriate to circumstance  Speech Logical/coherent  Interaction Assertive  Motor Activity Other (Comment) (steady gait)  Appearance/Hygiene Unremarkable  Behavior Characteristics Cooperative;Appropriate to situation  Mood Anxious  Thought Process  Coherency WDL  Content WDL  Delusions None reported or observed  Perception WDL  Hallucination None reported or observed  Judgment Poor  Confusion None  Danger to Self  Current suicidal ideation? Denies  Self-Injurious Behavior No self-injurious ideation or behavior indicators observed or expressed   Agreement Not to Harm Self Yes  Description of Agreement verbally contracts for safety  Danger to Others  Danger to Others None reported or observed

## 2022-11-04 NOTE — Group Note (Signed)
Date:  11/04/2022 Time:  2:18 PM  Group Topic/Focus:  Emotional Education:   The focus of this group is to discuss what feelings/emotions are, and how they are experienced.    Participation Level:  Minimal  Participation Quality:  Appropriate  Affect:  Appropriate  Cognitive:  Appropriate  Insight: Appropriate  Engagement in Group:  Limited  Modes of Intervention:  Discussion and Support  Additional Comments:   Memory Dance Raymond [name] 11/04/2022, 2:18 PM

## 2022-11-04 NOTE — Progress Notes (Signed)
Patient appears demanding and attention-seeking. Patient denies SI/HI/AVH. Pt reports anxiety is 4/10 and depression is 5/10. Pt reports "not good" sleep and good appetite. Pt reports having a nightmare last night impairing his sleep. Patient complied with morning medication with no reported side effects.  Pt reports not wanting to leave today, stating that 10 of his friends have already left and his last friend leaves tomorrow and he thinks his anxiety and depression will improve if he can leave tomorrow. Pt reports he tried to hang himself yesterday but denies SI today. Patient remains safe on Q67min checks and contracts for safety.      11/04/22 0921  Psych Admission Type (Psych Patients Only)  Admission Status Voluntary  Psychosocial Assessment  Patient Complaints Anxiety;Depression;Sleep disturbance  Eye Contact Fair  Facial Expression Anxious  Affect Irritable  Speech Logical/coherent  Interaction Attention-seeking;Demanding  Motor Activity Fidgety  Appearance/Hygiene Unremarkable  Behavior Characteristics Cooperative;Anxious  Mood Anxious;Depressed  Thought Process  Coherency WDL  Content Blaming others  Delusions None reported or observed  Perception WDL  Hallucination None reported or observed  Judgment Limited  Confusion None  Danger to Self  Current suicidal ideation? Denies  Self-Injurious Behavior No self-injurious ideation or behavior indicators observed or expressed   Agreement Not to Harm Self Yes  Description of Agreement verbal  Danger to Others  Danger to Others None reported or observed

## 2022-11-04 NOTE — Plan of Care (Signed)
  Problem: Education: Goal: Knowledge of [place] [facility] Education information/materials will improve Outcome: Progressing   Problem: Education: Goal: Verbalization of understanding the information provided will improve Outcome: Progressing   Problem: Coping: Goal: Ability to verbalize frustrations and anger appropriately will improve Outcome: Progressing   

## 2022-11-04 NOTE — [facility] Group Notes (Signed)
Spiritual care group on grief and loss facilitated by Chaplain Katy Malvern Kadlec, [facility]  Group Goal: Support / Education around grief and loss  Members engage in facilitated group support and psycho-social education.  Group Description:  Following introductions and group rules, group members engaged in facilitated group dialogue and support around topic of loss, with particular support around experiences of loss in their lives. Group Identified types of loss (relationships / self / things) and identified patterns, circumstances, and changes that precipitate losses. Reflected on thoughts / feelings around loss, normalized grief responses, and recognized variety in grief experience. Group encouraged individual reflection on safe space and on the coping skills that they are already utilizing.  Group drew on Adlerian / Rogerian and narrative framework  Patient Progress: Did not attend.  

## 2022-11-04 NOTE — Group Note (Signed)
Recreation Therapy Group Note   Group Topic:Animal Assisted Therapy   Group Date: 11/04/2022 Start Time: 0945 End Time: 1030 Facilitators: Kamill Fulbright-McCall, LRT,CTRS Location: [address] Dayroom   Animal-Assisted Activity (AAA) Program Checklist/Progress Notes Patient Eligibility Criteria Checklist & Daily Group note for Rec Tx Intervention  AAA/T Program Assumption of Risk Form signed by Patient/ or Parent Legal Guardian Yes  Patient is free of allergies or severe asthma Yes  Patient reports no fear of animals Yes  Patient reports no history of cruelty to animals Yes  Patient understands his/her participation is voluntary Yes  Patient washes hands before animal contact Yes  Patient washes hands after animal contact Yes   Affect/Mood: Appropriate   Participation Level: Engaged   Participation Quality: Independent   Behavior: Appropriate   Speech/Thought Process: Focused    Clinical Observations/Individualized Feedback: Patient attended session and interacted appropriately with therapy dog and peers. Patient asked appropriate questions about therapy dog and his training. Patient shared stories about their pets at home with group.    Plan: Continue to engage patient in RT group sessions 2-3x/week.   Kathlean Cinco-McCall, LRT,CTRS 11/04/2022 12:19 PM

## 2022-11-04 NOTE — Progress Notes (Signed)
[facility] MD Progress Note  11/04/2022 6:05 PM Raymond [name]  MRN:  [id number]  Principal Problem: Major depressive disorder, recurrent severe without psychotic features (HCC) Diagnosis: Principal Problem:   Major depressive disorder, recurrent severe without psychotic features [facility])  Reason for admission:  Raymond [name] is a 42 year old Caucasian male with prior self diagnosed psychiatric history of anxiety, panic attacks, depression, PTSD, who presents voluntarily to [facility] from [facility] ED after stabilization for worsening depression, anxiety, panic attack, resulting in suicidal ideation by intentional overdosing on 7-8, 500 mg Keppra and self-mutilation.   24-hour chart Review: Past 24 hours of patient's chart was reviewed.  Patient is compliant with scheduled meds. Required Agitation PRNs: none As needed medications: Albuterol given at 1238 yesterday for wheezing Nicorette gum given at 0803 & 0804 today for smoking cessation. Per RN notes, no documented behavioral issues and is attending group. Patient slept, 8 hours.    Today's assessment notes:   He presents alert and oriented to person, place, time & situation.  Mood is brighter, euthymic, and observed in the day room interacting with the other patients and playing games. He reports history of chronic suicidal ideations, however, denies suicidal ideation today during examination by this provider.  Patient reported to other staff that he experiences suicidal ideation and if discharge today that he will commit suicide.  Reports having panic attack earlier in the day however did not request for hydroxyzine for anxiety.  Patient is very inconsistent with his presentations.  Able to contract for safety while in the hospital. 15 minutes safety monitoring continues for safety.  Reports sleeping over 6 hours last night and having self reported nightmares. Continues on prazosin 1 mg p.o. q. nightly. Patient rates  depression as #5/10 and anxiety as #4/10, with 10 being highest severity. Encouraged to obtain as needed for anxiety from nursing staff.  He continues on his scheduled antidepressant of Prozac 40 mg p.o. daily and Risperdal 1 mg p.o. in a.m. and 2 mg p.o. at bedtime for mood stabilization.  Denies somatic discomfort.  Hygiene appears improved.  Expected date of discharge 11/05/2022.  Denies acute discomfort.  Going to the cafeteria with the patient and eating all his meals.  He denies SI, HI or AVH.  No further episode of anxiety attack observed.   Will continue current treatment plan with adjustment as indicated below as indicated below.   Total Time spent with patient: 30 minutes  Past Psychiatric History: Previous Psych Diagnoses: Major depressive disorder, recurrent severe without psychotic features, amphetamine abuse, cocaine abuse, substance-induced mood disorder, with suicide attempt.  Prior inpatient treatment: Admitted to [facility] behavioral health in [place] in 2020 for suicide attempt for 1 month.  Current/prior outpatient treatment: None, has scheduled appointment on June 2024 at 12 noon with [place] Prior rehab hx: Rehab for alcohol x 1 month in 2019 Psychotherapy hx: Yes History of suicide: X 4 in the last 3 days, another suicide attempt in 2020 History of homicide or aggression: Denies Psychiatric medication history: Thorazine, fluoxetine, hydroxyzine, risperidone, and trazodone. Psychiatric medication compliance history: Noncompliance Neuromodulation history: Denies  Current Psychiatrist: Denies Current therapist: Seeing Graciella Freer at the [facility]  Past Medical History:  Past Medical History:  Diagnosis Date   Anxiety    Hepatitis C    PTSD (post-traumatic stress disorder)     Past Surgical History:  Procedure Laterality Date   FACIAL FRACTURE SURGERY     metal plate under right eye  Family History: History reviewed. No pertinent family  history. Family Psychiatric  History: HPI Social History:  Social History   Substance and Sexual Activity  Alcohol Use Not Currently     Social History   Substance and Sexual Activity  Drug Use Not Currently    Social History   Socioeconomic History   Marital status: Single    Spouse name: Not on file   Number of children: Not on file   Years of education: Not on file   Highest education level: Not on file  Occupational History   Not on file  Tobacco Use   Smoking status: Some Days    Types: Cigarettes   Smokeless tobacco: Never  Vaping Use   Vaping Use: Never used  Substance and Sexual Activity   Alcohol use: Not Currently   Drug use: Not Currently   Sexual activity: Not on file  Other Topics Concern   Not on file  Social History Narrative   Not on file   Social Determinants of Health   Financial Resource Strain: Not on file  Food Insecurity: Food Insecurity Present (10/28/2022)   Hunger Vital Sign    Worried About Running Out of Food in the Last Year: Sometimes true    Ran Out of Food in the Last Year: Sometimes true  Transportation Needs: No Transportation Needs (10/28/2022)   PRAPARE - Administrator, Civil Service (Medical): No    Lack of Transportation (Non-Medical): No  Physical Activity: Not on file  Stress: Not on file  Social Connections: Not on file   Additional Social History:    Sleep: Good  Appetite:  Good  Current Medications: Current Facility-Administered Medications  Medication Dose Route Frequency Provider Last Rate Last Admin   acetaminophen (TYLENOL) tablet 650 mg  650 mg Oral Q6H PRN Onuoha, Josephine C, NP       albuterol (VENTOLIN HFA) 108 (90 Base) MCG/ACT inhaler 2 puff  2 puff Inhalation Q4H PRN Dahlia Byes C, NP   2 puff at 11/03/22 1238   alum & mag hydroxide-simeth (MAALOX/MYLANTA) 200-200-20 MG/5ML suspension 30 mL  30 mL Oral Q4H PRN Dahlia Byes C, NP       diphenhydrAMINE (BENADRYL) capsule 50 mg  50  mg Oral TID PRN Dahlia Byes C, NP       Or   diphenhydrAMINE (BENADRYL) injection 50 mg  50 mg Intramuscular TID PRN Dahlia Byes C, NP       FLUoxetine (PROZAC) capsule 40 mg  40 mg Oral Daily Meshilem Machuca C, FNP   40 mg at 11/04/22 0802   haloperidol (HALDOL) tablet 5 mg  5 mg Oral TID PRN Dahlia Byes C, NP       Or   haloperidol lactate (HALDOL) injection 5 mg  5 mg Intramuscular TID PRN Dahlia Byes C, NP       hydrOXYzine (ATARAX) tablet 50 mg  50 mg Oral Q6H PRN Dahlia Byes C, NP   50 mg at 11/03/22 2124   levETIRAcetam (KEPPRA) tablet 500 mg  500 mg Oral Q12H Massengill, Harrold Donath, MD   500 mg at 11/04/22 0802   lisinopril (ZESTRIL) tablet 10 mg  10 mg Oral Daily Dahlia Byes C, NP   10 mg at 11/04/22 0802   LORazepam (ATIVAN) tablet 2 mg  2 mg Oral TID PRN Dahlia Byes C, NP       Or   LORazepam (ATIVAN) injection 2 mg  2 mg Intramuscular TID PRN Earney Navy,  NP       magnesium hydroxide (MILK OF MAGNESIA) suspension 30 mL  30 mL Oral Daily PRN Dahlia Byes C, NP       nicotine polacrilex (NICORETTE) gum 2 mg  2 mg Oral PRN Abbott Pao, Nadir, MD   2 mg at 11/04/22 0804   prazosin (MINIPRESS) capsule 1 mg  1 mg Oral QHS Eldra Word C, FNP   1 mg at 11/03/22 2123   risperiDONE (RISPERDAL M-TABS) disintegrating tablet 1 mg  1 mg Oral Daily Nadean Montanaro C, FNP   1 mg at 11/04/22 0802   risperiDONE (RISPERDAL) tablet 2 mg  2 mg Oral QHS Cloria Ciresi C, FNP   2 mg at 11/03/22 2124   traZODone (DESYREL) tablet 100 mg  100 mg Oral QHS Octavious Zidek, Jesusita Oka, FNP   100 mg at 11/03/22 2124   Lab Results:  No results found for this or any previous visit (from the past 48 hour(s)).  Blood Alcohol level:  Lab Results  Component Value Date   ETH <10 10/27/2022   ETH <10 10/07/2022   Metabolic Disorder Labs: Lab Results  Component Value Date   HGBA1C 6.1 (H) 10/29/2022   MPG 128 10/29/2022   No results found for: "PROLACTIN" Lab Results  Component Value  Date   CHOL 210 (H) 10/29/2022   TRIG 337 (H) 10/29/2022   HDL 50 10/29/2022   CHOLHDL 4.2 10/29/2022   VLDL 67 (H) 10/29/2022   LDLCALC 93 10/29/2022    Physical Findings: AIMS:  , ,  ,  ,    CIWA:    COWS:     Musculoskeletal: Strength & Muscle Tone: within normal limits Gait & Station: normal Patient leans: N/A  Psychiatric Specialty Exam:  Presentation  General Appearance:  Appropriate for Environment; Casual  Eye Contact: Good  Speech: Clear and Coherent; Normal Rate  Speech Volume: Normal  Handedness: Right  Mood and Affect  Mood: Anxious; Depressed  Affect: Congruent  Thought Process  Thought Processes: Coherent; Linear  Descriptions of Associations:Intact  Orientation:Full (Time, Place and Person)  Thought Content:Logical  History of Schizophrenia/Schizoaffective disorder:No  Duration of Psychotic Symptoms:No data recorded Hallucinations:Hallucinations: None Description of Visual Hallucinations: Denies  Ideas of Reference:None  Suicidal Thoughts:Suicidal Thoughts: No  Homicidal Thoughts:Homicidal Thoughts: No  Sensorium  Memory: Immediate Good; Recent Good  Judgment: Fair  Insight: Good  Executive Functions  Concentration: Good  Attention Span: Good  Recall: Fair  Fund of Knowledge: Fair  Language: Good  Psychomotor Activity  Psychomotor Activity: Psychomotor Activity: Normal  Assets  Assets: Communication Skills; Desire for Improvement; Physical Health; Resilience  Sleep  Sleep: Sleep: Good Number of Hours of Sleep: 6  Physical Exam: Physical Exam Vitals and nursing note reviewed.  HENT:     Head: Normocephalic.     Nose: Nose normal.     Mouth/Throat:     Mouth: Mucous membranes are moist.     Pharynx: Oropharynx is clear.  Eyes:     Pupils: Pupils are equal, round, and reactive to light.  Cardiovascular:     Rate and Rhythm: Normal rate.     Pulses: Normal pulses.  Pulmonary:     Effort:  Pulmonary effort is normal.  Abdominal:     Comments: Deferred  Genitourinary:    Comments: Deferred Musculoskeletal:        General: Normal range of motion.     Cervical back: Normal range of motion.  Skin:    General: Skin is warm.  Neurological:     General: No focal deficit present.     Mental Status: He is alert and oriented to person, place, and time.  Psychiatric:        Mood and Affect: Mood normal.        Behavior: Behavior normal.    Review of Systems  Constitutional:  Negative for fever.  HENT: Negative.    Eyes:  Negative for blurred vision.  Respiratory:  Negative for shortness of breath.   Cardiovascular:  Negative for chest pain.  Gastrointestinal:  Negative for nausea and vomiting.  Genitourinary: Negative.   Musculoskeletal:  Negative for myalgias.  Skin: Negative.  Negative for rash.  Neurological:  Positive for seizures (Hx of seizures).  Endo/Heme/Allergies:        See allergy listing  Psychiatric/Behavioral:  Positive for depression. The patient is nervous/anxious.    Blood pressure 122/88, pulse 84, temperature 98.3 F (36.8 C), temperature source Oral, resp. rate 12, height 6\' 1"  (1.854 m), weight 83 kg, SpO2 97 %. Body mass index is 24.14 kg/m.  Treatment Plan Summary: Daily contact with patient to assess and evaluate symptoms and progress in treatment and Medication management  Physician Treatment Plan for Primary Diagnosis:  Assessment: Major depressive disorder, recurrent severe without psychotic features (HCC) Generalized anxiety disorder PTSD Seizure disorder   Plan: Medications: Continue Prozac 20 mg p.o. daily for depression, may titrate to 40 mg p.o. starting tomorrow 10/30/2022 Continue hydroxyzine 50 mg p.o. every 6 hours as needed for anxiety Continue Risperdal 1 mg p.o. daily for mood stabilization Continue Risperdal 2 mg p.o. at bedtime for mood stabilization Continue trazodone 100 mg p.o. at bedtime for insomnia Continue  prazosin 1 mg p.o. q. nightly for nightmares   Other medical problems: Albuterol Ventolin HFA 108 90 base MCG/ACT inhaler 2 puffs into the lungs every 4 hours as needed for wheezing or shortness of breath Keppra 500 mg tablet 1 p.o. 2 times daily for seizures activity Lisinopril 10 mg tablet by mouth daily for high blood pressure   Agitation protocol: Benadryl capsule 50 mg p.o. 3 times daily as needed agitation or Benadryl injection 50 mg IM 3 times daily as needed agitation   Haldol tablets 5 mg po 3 times daily as needed agitation or Haldol lactate injection 5 mg IM 3 times daily as needed agitation   Lorazepam tablet 2 mg p.o. 3 times daily as needed agitation or Lorazepam injection 2 mg IM 3 times daily as needed agitation   Other PRN Medications -Acetaminophen 650 mg every 6 as needed/mild pain -Maalox 30 mL oral every 4 as needed/digestion -Magnesium hydroxide 30 mL daily as needed/mild constipation --Nicotine gum 2 mg oral for smoking cessation   -- The risks/benefits/side-effects/alternatives to this medication were discussed in detail with the patient and time was given for questions. The patient consents to medication trial.              -- Encouraged patient to participate  in unit milieu and in scheduled group therapies    Safety and Monitoring: Voluntary admission to inpatient psychiatric unit for safety, stabilization and treatment Daily contact with patient to assess and evaluate symptoms and progress in treatment Patient's case to be discussed in multi-disciplinary team meeting Observation Level : q15 minute checks Vital signs: q12 hours Precautions: suicide, but pt currently verbally contracts for safety on unit    Discharge Planning: Social work and case management to assist with discharge planning and identification of hospital follow-up needs prior  to discharge Estimated LOS: 5-7 days Discharge Concerns: Need to establish a safety plan; Medication  compliance and effectiveness Discharge Goals: Return home with outpatient referrals for mental health follow-up including medication management/psychotherapy.    Long Term Goal(s): Improvement in symptoms so as ready for discharge   Short Term Goals: Ability to identify changes in lifestyle to reduce recurrence of condition will improve, Ability to verbalize feelings will improve, Ability to disclose and discuss suicidal ideas, Ability to demonstrate self-control will improve, Ability to identify and develop effective coping behaviors will improve, Ability to maintain clinical measurements within normal limits will improve, Compliance with prescribed medications will improve, and Ability to identify triggers associated with substance abuse/mental health issues will improve   Physician Treatment Plan for Secondary Diagnosis: Principal Problem:   Major depressive disorder, recurrent severe without psychotic features (HCC)   I certify that inpatient services furnished can reasonably be expected to improve the patient's condition.     Cecilie Lowers, FNP 11/04/2022, 6:05 PMPatient ID: Raymond [name], male   DOB: June 20, 1980, 42 y.o.   MRN: [id number] Patient ID: Raymond [name], male   DOB: [birth year], 42 y.o.   MRN: [id number] Patient ID: Raymond [name], male   DOB: Dec 05, 1980, 42 y.o.   MRN: [id number] Patient ID: Raymond [name], male   DOB: August 03, 1980, 42 y.o.   MRN: [id number] Patient ID: Raymond [name], male   DOB: [birth year], 42 y.o.   MRN: [id number]

## 2022-11-04 NOTE — Progress Notes (Signed)
   11/04/22 0601  15 Minute Checks  Location Bedroom  Visual Appearance Calm  Behavior Sleeping  Sleep (Behavioral Health Patients Only)  Calculate sleep? (Click Yes once per 24 hr at 0600 safety check) Yes  Documented sleep last 24 hours 7.75

## 2022-11-04 NOTE — Progress Notes (Signed)
   11/03/22 2300  Psych Admission Type (Psych Patients Only)  Admission Status Voluntary  Psychosocial Assessment  Patient Complaints Anxiety;Depression;Panic attack  Eye Contact Fair  Facial Expression Anxious  Affect Appropriate to circumstance  Speech Logical/coherent  Interaction Assertive  Motor Activity Fidgety  Appearance/Hygiene Unremarkable  Behavior Characteristics Cooperative;Appropriate to situation  Mood Anxious  Thought Process  Coherency WDL  Content WDL  Delusions None reported or observed  Perception Hallucinations  Hallucination Visual  Judgment Limited  Confusion None  Danger to Self  Current suicidal ideation? Denies  Self-Injurious Behavior No self-injurious ideation or behavior indicators observed or expressed   Agreement Not to Harm Self Yes  Description of Agreement Verbal contract  Danger to Others  Danger to Others None reported or observed

## 2022-11-04 NOTE — Plan of Care (Signed)
  Problem: Education: Goal: Knowledge of [place] [place] [facility] Education information/materials will improve Outcome: Progressing Goal: Emotional status will improve Outcome: Progressing Goal: Mental status will improve Outcome: Progressing Goal: Verbalization of understanding the information provided will improve Outcome: Progressing   Problem: Activity: Goal: Interest or engagement in activities will improve Outcome: Progressing Goal: Sleeping patterns will improve Outcome: Progressing   Problem: Coping: Goal: Ability to verbalize frustrations and anger appropriately will improve Outcome: Progressing Goal: Ability to demonstrate self-control will improve Outcome: Progressing   Problem: Health Behavior/Discharge Planning: Goal: Identification of resources available to assist in meeting health care needs will improve Outcome: Progressing Goal: Compliance with treatment plan for underlying cause of condition will improve Outcome: Progressing   Problem: Physical Regulation: Goal: Ability to maintain clinical measurements within normal limits will improve Outcome: Progressing   Problem: Safety: Goal: Periods of time without injury will increase Outcome: Progressing   Problem: Education: Goal: Utilization of techniques to improve thought processes will improve Outcome: Progressing Goal: Knowledge of the prescribed therapeutic regimen will improve Outcome: Progressing   Problem: Activity: Goal: Interest or engagement in leisure activities will improve Outcome: Progressing Goal: Imbalance in normal sleep/wake cycle will improve Outcome: Progressing   Problem: Coping: Goal: Coping ability will improve Outcome: Progressing Goal: Will verbalize feelings Outcome: Progressing   Problem: Health Behavior/Discharge Planning: Goal: Ability to make decisions will improve Outcome: Progressing Goal: Compliance with therapeutic regimen will improve Outcome: Progressing    Problem: Role Relationship: Goal: Will demonstrate positive changes in social behaviors and relationships Outcome: Progressing   Problem: Safety: Goal: Ability to disclose and discuss suicidal ideas will improve Outcome: Progressing Goal: Ability to identify and utilize support systems that promote safety will improve Outcome: Progressing   Problem: Self-Concept: Goal: Will verbalize positive feelings about self Outcome: Progressing Goal: Level of anxiety will decrease Outcome: Progressing   Problem: Education: Goal: Ability to state activities that reduce stress will improve Outcome: Progressing   Problem: Coping: Goal: Ability to identify and develop effective coping behavior will improve Outcome: Progressing   Problem: Self-Concept: Goal: Ability to identify factors that promote anxiety will improve Outcome: Progressing Goal: Level of anxiety will decrease Outcome: Progressing Goal: Ability to modify response to factors that promote anxiety will improve Outcome: Progressing   Problem: Education: Goal: Ability to make informed decisions regarding treatment will improve Outcome: Progressing   Problem: Coping: Goal: Coping ability will improve Outcome: Progressing   Problem: Health Behavior/Discharge Planning: Goal: Identification of resources available to assist in meeting health care needs will improve Outcome: Progressing   Problem: Medication: Goal: Compliance with prescribed medication regimen will improve Outcome: Progressing   Problem: Self-Concept: Goal: Ability to disclose and discuss suicidal ideas will improve Outcome: Progressing Goal: Will verbalize positive feelings about self Outcome: Progressing   

## 2022-11-04 NOTE — Progress Notes (Signed)
   11/04/22 2130  Psych Admission Type (Psych Patients Only)  Admission Status Voluntary  Psychosocial Assessment  Patient Complaints Anxiety;Depression;Sleep disturbance  Eye Contact Fair  Facial Expression Anxious  Affect Preoccupied  Speech Logical/coherent  Interaction Attention-seeking;Assertive  Motor Activity Fidgety  Appearance/Hygiene Unremarkable  Behavior Characteristics Cooperative;Anxious  Mood Anxious;Depressed  Thought Process  Coherency WDL  Content Blaming others  Delusions None reported or observed  Perception Hallucinations  Hallucination Visual  Judgment Limited  Confusion None  Danger to Self  Current suicidal ideation? Denies  Self-Injurious Behavior No self-injurious ideation or behavior indicators observed or expressed   Agreement Not to Harm Self Yes  Description of Agreement Verbal  Danger to Others  Danger to Others None reported or observed

## 2022-11-05 NOTE — Progress Notes (Signed)
Patient continued to endorsed visual/auditory hallucinations, patient stated "I am seeing people I know from the past, they are moving their mouth, but I don't know what they are saying." I tried to hang myself with a towel in the shower two days in a row, because I missed my mother, I tried to hurt myself because I have no where to go,  I need someone to help me with my suicide and panic attacks, that is the reason I don't want to leave." Patient offered support, verbally contract for safety. Q 15 minute safety checks  ongoing.

## 2022-11-05 NOTE — Group Note (Signed)
Date:  11/05/2022 Time:  6:17 PM  Group Topic/Focus:  Wellness Toolbox:   The focus of this group is to discuss various aspects of wellness, balancing those aspects and exploring ways to increase the ability to experience wellness.  Patients will create a wellness toolbox for use upon discharge.    Participation Level:  Active  Participation Quality:  Appropriate  Affect:  Appropriate  Cognitive:  Appropriate  Insight: Appropriate  Engagement in Group:  Engaged  Modes of Intervention:  Discussion  Additional Comments:    Jaquita Rector 11/05/2022, 6:17 PM

## 2022-11-05 NOTE — Group Note (Unsigned)
Date:  11/05/2022 Time:  10:08 AM  Group Topic/Focus:  Goals Group:   The focus of this group is to help patients establish daily goals to achieve during treatment and discuss how the patient can incorporate goal setting into their daily lives to aide in recovery. Orientation:   The focus of this group is to educate the patient on the purpose and policies of crisis stabilization and provide a format to answer questions about their admission.  The group details unit policies and expectations of patients while admitted.     Participation Level:  {[facility] PARTICIPATION LEVEL:22264}  Participation Quality:  {[facility] PARTICIPATION QUALITY:22265}  Affect:  {[facility] AFFECT:22266}  Cognitive:  {[facility] COGNITIVE:22267}  Insight: {[facility] Insight2:20797}  Engagement in Group:  {[facility] ENGAGEMENT IN GROUP:22268}  Modes of Intervention:  {[facility] MODES OF INTERVENTION:22269}  Additional Comments:  ***  Raymond [name] Raymond [name] 11/05/2022, 10:08 AM  

## 2022-11-05 NOTE — [facility] Group Notes (Signed)
[facility] Group Notes:  (Nursing/MHT/Case Management/Adjunct)  Date:  11/05/2022  Time:  2:20 AM  Type of Therapy:   Wrap-up group  Participation Level:  Active  Participation Quality:  Appropriate  Affect:  Appropriate  Cognitive:  Appropriate  Insight:  Appropriate  Engagement in Group:  Engaged  Modes of Intervention:  Education  Summary of Progress/Problems: Pt goal to work on D/C. Day 4/10.  Raymond [name] 11/05/2022, 2:20 AM

## 2022-11-05 NOTE — Group Note (Signed)
Date:  11/05/2022 Time:  10:27 AM  Group Topic/Focus:  Goals Group:   The focus of this group is to help patients establish daily goals to achieve during treatment and discuss how the patient can incorporate goal setting into their daily lives to aide in recovery. Orientation:   The focus of this group is to educate the patient on the purpose and policies of crisis stabilization and provide a format to answer questions about their admission.  The group details unit policies and expectations of patients while admitted.    Participation Level:  Active  Participation Quality:  Appropriate  Affect:  Appropriate  Cognitive:  Appropriate  Insight: Appropriate  Engagement in Group:  Engaged  Modes of Intervention:  Discussion  Additional Comments:    Jaquita Rector 11/05/2022, 10:27 AM

## 2022-11-05 NOTE — Progress Notes (Signed)
[facility] MD Progress Note  11/05/2022 11:03 AM Raymond [name]  MRN:  [id number]  Principal Problem: Major depressive disorder, recurrent severe without psychotic features (HCC) Diagnosis: Principal Problem:   Major depressive disorder, recurrent severe without psychotic features (HCC)  Reason for admission:  Raymond [name] is a 42 year old Caucasian male with prior self diagnosed psychiatric history of anxiety, panic attacks, depression, PTSD, who presents voluntarily to [facility] from [facility] ED after stabilization for worsening depression, anxiety, panic attack, resulting in suicidal ideation by intentional overdosing on 7-8, 500 mg Keppra and self-mutilation.   24-hour chart Review: Past 24 hours of patient's chart was reviewed.  Patient is compliant with scheduled meds. Required Agitation PRNs: none As needed medications: Albuterol given at 2055 yesterday and today at 0752 for wheezing, Nicorette gum given yesterday at 2058 for smoking cessation and hydroxyzine given yesterday at 2058 for anxiety. Per RN notes, no documented behavioral issues and is attending group. Patient slept, 8 hours.    Today's assessment notes:   Patient was seen and examined in the office on [address] in the presence of the attending psychiatrist.  Patient was scheduled to be discharged today, however he reports that he is not ready for discharge due to chronic suicidal ideation.  As per nursing staff report, patient reports attempted suicide by wrapping a towel around his neck last night.  Similar incident was reported on Saturday by patient.  Patient reported to this provider and attending psychiatrist that he still has the urge to harm himself.  He requested to be sent to [facility] where he could see his previous psychiatrist and therapist and be kept for another week before he goes to his apartment.  Attending psychiatrist offered for patient to stay for another 2 days  for safety monitoring prior to being discharged to his apartment, which the patient is in agreement.  Safety planning to be put in place by the LCSW prior to discharge.  He presents with emotional labile mood and anxiety.  Affect is flat and sad.  He presents alert and oriented to person, place, time & situation.  Bergen is observed in the day room interacting with the other patients and playing games after this examination.  Speech is clear, coherent with normal volume.  Thought process and thought content remain with inconsistencies at presentation.  He continues on his scheduled antidepressant of Prozac 40 mg p.o. daily and Risperdal 1 mg p.o. in a.m. and 2 mg p.o. at bedtime for mood stabilization.  Denies somatic discomfort.  Going to [employer] with the other patients and eating all his meals.    He continues to report chronic suicidal ideations, however, able to contract for safety while in the hospital. 15 minutes safety monitoring continues for safety.  Reports sleeping over 8 hours last night without reported nightmares. Continues on prazosin 1 mg p.o. q. nightly. Patient rates depression as #5/10 and anxiety as #4/10, with 10 being highest severity. Encouraged to obtain as needed for anxiety from nursing staff.   Expected date of discharge 11/07/2022. He denies HI or AVH. Will continue current treatment plan as indicated below.   Total Time spent with patient: 30 minutes  Past Psychiatric History: Previous Psych Diagnoses: Major depressive disorder, recurrent severe without psychotic features, amphetamine abuse, cocaine abuse, substance-induced mood disorder, with suicide attempt.  Prior inpatient treatment: Admitted to [facility] behavioral health in [place] in 2020 for suicide attempt for 1 month.  Current/prior outpatient treatment: None,  has scheduled appointment on June 2024 at 12 noon with [facility] Prior rehab hx: Rehab for alcohol x 1 month in 2019 Psychotherapy hx:  Yes History of suicide: X 4 in the last 3 days, another suicide attempt in 2020 History of homicide or aggression: Denies Psychiatric medication history: Thorazine, fluoxetine, hydroxyzine, risperidone, and trazodone. Psychiatric medication compliance history: Noncompliance Neuromodulation history: Denies  Current Psychiatrist: Denies Current therapist: Seeing Graciella Freer at the [place]  Past Medical History:  Past Medical History:  Diagnosis Date   Anxiety    Hepatitis C    PTSD (post-traumatic stress disorder)     Past Surgical History:  Procedure Laterality Date   FACIAL FRACTURE SURGERY     metal plate under right eye   Family History: History reviewed. No pertinent family history. Family Psychiatric  History: HPI Social History:  Social History   Substance and Sexual Activity  Alcohol Use Not Currently     Social History   Substance and Sexual Activity  Drug Use Not Currently    Social History   Socioeconomic History   Marital status: Single    Spouse name: Not on file   Number of children: Not on file   Years of education: Not on file   Highest education level: Not on file  Occupational History   Not on file  Tobacco Use   Smoking status: Some Days    Types: Cigarettes   Smokeless tobacco: Never  Vaping Use   Vaping Use: Never used  Substance and Sexual Activity   Alcohol use: Not Currently   Drug use: Not Currently   Sexual activity: Not on file  Other Topics Concern   Not on file  Social History Narrative   Not on file   Social Determinants of Health   Financial Resource Strain: Not on file  Food Insecurity: Food Insecurity Present (10/28/2022)   Hunger Vital Sign    Worried About Running Out of Food in the Last Year: Sometimes true    Ran Out of Food in the Last Year: Sometimes true  Transportation Needs: No Transportation Needs (10/28/2022)   PRAPARE - Administrator, Civil Service (Medical): No    Lack of Transportation  (Non-Medical): No  Physical Activity: Not on file  Stress: Not on file  Social Connections: Not on file   Additional Social History:    Sleep: Good  Appetite:  Good  Current Medications: Current Facility-Administered Medications  Medication Dose Route Frequency Provider Last Rate Last Admin   acetaminophen (TYLENOL) tablet 650 mg  650 mg Oral Q6H PRN Onuoha, Josephine C, NP       albuterol (VENTOLIN HFA) 108 (90 Base) MCG/ACT inhaler 2 puff  2 puff Inhalation Q4H PRN Dahlia Byes C, NP   2 puff at 11/05/22 0752   alum & mag hydroxide-simeth (MAALOX/MYLANTA) 200-200-20 MG/5ML suspension 30 mL  30 mL Oral Q4H PRN Dahlia Byes C, NP       diphenhydrAMINE (BENADRYL) capsule 50 mg  50 mg Oral TID PRN Dahlia Byes C, NP       Or   diphenhydrAMINE (BENADRYL) injection 50 mg  50 mg Intramuscular TID PRN Dahlia Byes C, NP       FLUoxetine (PROZAC) capsule 40 mg  40 mg Oral Daily Rubens Cranston C, FNP   40 mg at 11/05/22 0749   haloperidol (HALDOL) tablet 5 mg  5 mg Oral TID PRN Earney Navy, NP       Or   haloperidol  lactate (HALDOL) injection 5 mg  5 mg Intramuscular TID PRN Earney Navy, NP       hydrOXYzine (ATARAX) tablet 50 mg  50 mg Oral Q6H PRN Dahlia Byes C, NP   50 mg at 11/04/22 2056   levETIRAcetam (KEPPRA) tablet 500 mg  500 mg Oral Q12H Massengill, Harrold Donath, MD   500 mg at 11/05/22 0749   lisinopril (ZESTRIL) tablet 10 mg  10 mg Oral Daily Dahlia Byes C, NP   10 mg at 11/05/22 0749   LORazepam (ATIVAN) tablet 2 mg  2 mg Oral TID PRN Earney Navy, NP       Or   LORazepam (ATIVAN) injection 2 mg  2 mg Intramuscular TID PRN Dahlia Byes C, NP       magnesium hydroxide (MILK OF MAGNESIA) suspension 30 mL  30 mL Oral Daily PRN Dahlia Byes C, NP       nicotine polacrilex (NICORETTE) gum 2 mg  2 mg Oral PRN Abbott Pao, Nadir, MD   2 mg at 11/04/22 2058   prazosin (MINIPRESS) capsule 1 mg  1 mg Oral QHS Kissy Cielo C, FNP   1 mg at  11/04/22 2056   risperiDONE (RISPERDAL M-TABS) disintegrating tablet 1 mg  1 mg Oral Daily Hiran Leard C, FNP   1 mg at 11/05/22 0749   risperiDONE (RISPERDAL) tablet 2 mg  2 mg Oral QHS Airanna Partin C, FNP   2 mg at 11/04/22 2056   traZODone (DESYREL) tablet 100 mg  100 mg Oral QHS Leslee Haueter C, FNP   100 mg at 11/04/22 2056   Lab Results:  No results found for this or any previous visit (from the past 48 hour(s)).  Blood Alcohol level:  Lab Results  Component Value Date   ETH <10 10/27/2022   ETH <10 10/07/2022   Metabolic Disorder Labs: Lab Results  Component Value Date   HGBA1C 6.1 (H) 10/29/2022   MPG 128 10/29/2022   No results found for: "PROLACTIN" Lab Results  Component Value Date   CHOL 210 (H) 10/29/2022   TRIG 337 (H) 10/29/2022   HDL 50 10/29/2022   CHOLHDL 4.2 10/29/2022   VLDL 67 (H) 10/29/2022   LDLCALC 93 10/29/2022    Physical Findings: AIMS:  , ,  ,  ,    CIWA:    COWS:     Musculoskeletal: Strength & Muscle Tone: within normal limits Gait & Station: normal Patient leans: N/A  Psychiatric Specialty Exam:  Presentation  General Appearance:  Appropriate for Environment; Casual  Eye Contact: Good  Speech: Clear and Coherent; Normal Rate  Speech Volume: Normal  Handedness: Right  Mood and Affect  Mood: Anxious; Depressed  Affect: Congruent  Thought Process  Thought Processes: Coherent; Linear  Descriptions of Associations:Intact  Orientation:Full (Time, Place and Person)  Thought Content:Logical; Rumination (With inconsistencies)  History of Schizophrenia/Schizoaffective disorder:No  Duration of Psychotic Symptoms:No data recorded Hallucinations:Hallucinations: None Description of Visual Hallucinations: Denies  Ideas of Reference:None  Suicidal Thoughts:Suicidal Thoughts: Yes, Passive SI Passive Intent and/or Plan: With Intent; With Plan; Without Means to Carry Out  Homicidal Thoughts:Homicidal Thoughts:  No  Sensorium  Memory: Immediate Good; Recent Good  Judgment: Fair  Insight: Fair  Executive Functions  Concentration: Good  Attention Span: Good  Recall: Fair  Fund of Knowledge: Fair  Language: Good  Psychomotor Activity  Psychomotor Activity: Psychomotor Activity: Normal  Assets  Assets: Communication Skills; Physical Health; Resilience; Desire for Improvement  Sleep  Sleep: Sleep:  Good Number of Hours of Sleep: 8  Physical Exam: Physical Exam Vitals and nursing note reviewed.  HENT:     Head: Normocephalic.     Nose: Nose normal.     Mouth/Throat:     Mouth: Mucous membranes are moist.     Pharynx: Oropharynx is clear.  Eyes:     Pupils: Pupils are equal, round, and reactive to light.  Cardiovascular:     Rate and Rhythm: Normal rate.     Pulses: Normal pulses.  Pulmonary:     Effort: Pulmonary effort is normal.  Abdominal:     Comments: Deferred  Genitourinary:    Comments: Deferred Musculoskeletal:        General: Normal range of motion.     Cervical back: Normal range of motion.  Skin:    General: Skin is warm.  Neurological:     General: No focal deficit present.     Mental Status: He is alert and oriented to person, place, and time.  Psychiatric:        Mood and Affect: Mood normal.        Behavior: Behavior normal.    Review of Systems  Constitutional:  Negative for fever.  HENT: Negative.    Eyes:  Negative for blurred vision.  Respiratory:  Negative for shortness of breath.   Cardiovascular:  Negative for chest pain.  Gastrointestinal:  Negative for nausea and vomiting.  Genitourinary: Negative.   Musculoskeletal:  Negative for myalgias.  Skin: Negative.  Negative for rash.  Neurological:  Positive for seizures (Hx of seizures).  Endo/Heme/Allergies:        See allergy listing  Psychiatric/Behavioral:  Positive for depression. The patient is nervous/anxious.    Blood pressure (!) 132/92, pulse (!) 102, temperature  98.2 F (36.8 C), temperature source Oral, resp. rate 16, height 6\' 1"  (1.854 m), weight 83 kg, SpO2 98 %. Body mass index is 24.14 kg/m.  Treatment Plan Summary: Daily contact with patient to assess and evaluate symptoms and progress in treatment and Medication management  Physician Treatment Plan for Primary Diagnosis:  Assessment: Major depressive disorder, recurrent severe without psychotic features (HCC) Generalized anxiety disorder PTSD Seizure disorder   Plan: Medications: Continue Prozac 20 mg p.o. daily for depression, may titrate to 40 mg p.o. starting tomorrow 10/30/2022 Continue hydroxyzine 50 mg p.o. every 6 hours as needed for anxiety Continue Risperdal 1 mg p.o. daily for mood stabilization Continue Risperdal 2 mg p.o. at bedtime for mood stabilization Continue trazodone 100 mg p.o. at bedtime for insomnia Continue prazosin 1 mg p.o. q. nightly for nightmares   Other medical problems: Albuterol Ventolin HFA 108 90 base MCG/ACT inhaler 2 puffs into the lungs every 4 hours as needed for wheezing or shortness of breath Keppra 500 mg tablet 1 p.o. 2 times daily for seizures activity Lisinopril 10 mg tablet by mouth daily for high blood pressure   Agitation protocol: Benadryl capsule 50 mg p.o. 3 times daily as needed agitation or Benadryl injection 50 mg IM 3 times daily as needed agitation   Haldol tablets 5 mg po 3 times daily as needed agitation or Haldol lactate injection 5 mg IM 3 times daily as needed agitation   Lorazepam tablet 2 mg p.o. 3 times daily as needed agitation or Lorazepam injection 2 mg IM 3 times daily as needed agitation   Other PRN Medications -Acetaminophen 650 mg every 6 as needed/mild pain -Maalox 30 mL oral every 4 as needed/digestion -Magnesium hydroxide 30 mL  daily as needed/mild constipation --Nicotine gum 2 mg oral for smoking cessation   -- The risks/benefits/side-effects/alternatives to this medication were discussed in detail  with the patient and time was given for questions. The patient consents to medication trial.              -- Encouraged patient to participate  in unit milieu and in scheduled group therapies    Safety and Monitoring: Voluntary admission to inpatient psychiatric unit for safety, stabilization and treatment Daily contact with patient to assess and evaluate symptoms and progress in treatment Patient's case to be discussed in multi-disciplinary team meeting Observation Level : q15 minute checks Vital signs: q12 hours Precautions: suicide, but pt currently verbally contracts for safety on unit    Discharge Planning: Social work and case management to assist with discharge planning and identification of hospital follow-up needs prior to discharge Estimated LOS: 5-7 days Discharge Concerns: Need to establish a safety plan; Medication compliance and effectiveness Discharge Goals: Return home with outpatient referrals for mental health follow-up including medication management/psychotherapy.    Long Term Goal(s): Improvement in symptoms so as ready for discharge   Short Term Goals: Ability to identify changes in lifestyle to reduce recurrence of condition will improve, Ability to verbalize feelings will improve, Ability to disclose and discuss suicidal ideas, Ability to demonstrate self-control will improve, Ability to identify and develop effective coping behaviors will improve, Ability to maintain clinical measurements within normal limits will improve, Compliance with prescribed medications will improve, and Ability to identify triggers associated with substance abuse/mental health issues will improve   Physician Treatment Plan for Secondary Diagnosis: Principal Problem:   Major depressive disorder, recurrent severe without psychotic features (HCC)   I certify that inpatient services furnished can reasonably be expected to improve the patient's condition.     Cecilie Lowers, FNP 11/05/2022, 11:03  AMPatient ID: [name], male   DOB: September 06, 1980, 42 y.o.   MRN: [id number] Patient ID: Oziel Bulla, male   DOB: March 17, 1981, 42 y.o.   MRN: [id number] Patient ID: Xyion Virden, male   DOB: 02-14-1981, 42 y.o.   MRN: [id number] Patient ID: Robertanthony Sullender, male   DOB: [birth year], 42 y.o.   MRN: [id number] Patient ID: Mehmed Stodghill, male   DOB: 1981/01/07, 42 y.o.   MRN: [id number] Patient ID: Jacey Devi, male   DOB: 23-Dec-1980, 42 y.o.   MRN: [id number]

## 2022-11-05 NOTE — Progress Notes (Signed)
   11/05/22 0612  15 Minute Checks  Location Bedroom  Visual Appearance Calm  Behavior Composed  Sleep (Behavioral Health Patients Only)  Calculate sleep? (Click Yes once per 24 hr at 0600 safety check) Yes  Documented sleep last 24 hours 7.5

## 2022-11-05 NOTE — Group Note (Unsigned)
Date:  11/05/2022 Time:  6:22 PM  Group Topic/Focus:  Wellness Toolbox:   The focus of this group is to discuss various aspects of wellness, balancing those aspects and exploring ways to increase the ability to experience wellness.  Patients will create a wellness toolbox for use upon discharge.     Participation Level:  {BHH PARTICIPATION LEVEL:22264}  Participation Quality:  {BHH PARTICIPATION QUALITY:22265}  Affect:  {BHH AFFECT:22266}  Cognitive:  {BHH COGNITIVE:22267}  Insight: {BHH Insight2:20797}  Engagement in Group:  {BHH ENGAGEMENT IN GROUP:22268}  Modes of Intervention:  {BHH MODES OF INTERVENTION:22269}  Additional Comments:  ***  Aislinn Feliz Lashawn Nikka Hakimian 11/05/2022, 6:22 PM  

## 2022-11-05 NOTE — Progress Notes (Signed)
   11/05/22 0900  Psych Admission Type (Psych Patients Only)  Admission Status Voluntary  Psychosocial Assessment  Patient Complaints Anxiety;Depression  Eye Contact Fair  Facial Expression Anxious  Affect Preoccupied  Speech Logical/coherent  Interaction Assertive  Motor Activity Fidgety  Appearance/Hygiene Unremarkable  Behavior Characteristics Cooperative;Anxious  Mood Depressed;Anxious  Thought Process  Coherency WDL  Content Blaming others  Delusions None reported or observed  Perception WDL  Hallucination None reported or observed  Judgment Limited  Confusion None  Danger to Self  Current suicidal ideation? Denies  Agreement Not to Harm Self Yes  Description of Agreement verbal  Danger to Others  Danger to Others None reported or observed

## 2022-11-05 NOTE — Progress Notes (Signed)
Pt rates depression 5/10 and anxiety 4/10. Pt presents labile. Pt was calm when given his scheduled seizure medication at 1955 and explained to pt other scheduled meds were listed for 2200 and could be given starting around 2100, pt was receptive and went to group. Pt then was upset/frustrated at time of next scheduled meds and preoccupied with seizure med. "You all play with meds too much, I have seizures and depression and anxiety, I need my meds", pt had been given his seizure medication at scheduled time and reminded that it had been given, engaged pt in talking about what he enjoys doing when he feels anxious/depressed. 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 Pt began to calm down and smiling as he spoke about what he enjoys. Pt shared he wants to move to [place] and start new, wants to get better so he can find a wife and have children. Pt currently denies SI/HI/AVH and verbally contracts for safety. Provided support and encouragement. Pt safe on the unit. Q 15 minute safety checks continued.

## 2022-11-05 NOTE — BHH Group Notes (Signed)
B[facility] Group Notes:  (Nursing/MHT/Case Management/Adjunct)  Date:  11/05/2022  Time:  8:21 PM  Type of Therapy:   NA Group  Participation Level:  Active  Participation Quality:  Appropriate  Affect:  Appropriate  Cognitive:  Appropriate  Insight:  Appropriate  Engagement in Group:  Engaged  Modes of Intervention:  Education  Summary of Progress/Problems: Attended NA meeting.  Raymond [name] 11/05/2022, 8:21 PM

## 2022-11-05 NOTE — Group Note (Signed)
Recreation Therapy Group Note   Group Topic:Problem Solving  Group Date: 11/05/2022 Start Time: 0932 End Time: 1012 Facilitators: Jessic Standifer-McCall, LRT,CTRS Location: [address] Dayroom   Goal Area(s) Addresses:  Patient will effectively work with peer towards shared goal.  Patient will identify skills used to make activity successful.  Patient will share challenges and verbalize solution-driven approaches used. Patient will identify how skills used during activity can be used to reach post d/c goals.   Group Description:  [employer]. Patients were provided the following materials: 4 drinking straws, 5 rubber bands, 5 paper clips, 2 index cards and 2 drinking cups. Using the provided materials patients were asked to build a launching mechanism to launch a ping pong ball across the room, approximately 10 feet. Patients were divided into teams of 3-5. Instructions required all materials be incorporated into the device, functionality of items left to the peer group's discretion.   Affect/Mood: Appropriate   Participation Level: Engaged   Participation Quality: Independent   Behavior: Appropriate   Speech/Thought Process: Focused   Insight: Good   Judgement: Good   Modes of Intervention: STEM Activity   Patient Response to Interventions:  Engaged   Education Outcome:  Acknowledges education   Clinical Observations/Individualized Feedback: Pt was engaged and focused at completing the activity.      Plan: Continue to engage patient in RT group sessions 2-3x/week.   Rakel Junio-McCall, LRT,CTRS  11/05/2022 12:30 PM

## 2022-11-06 NOTE — BHH Group Notes (Signed)
B[facility] Group Notes:  (Nursing/MHT/Case Management/Adjunct)  Date:  11/06/2022  Time:  9:13 PM  Type of Therapy:   Wrap-up group  Participation Level:  Active  Participation Quality:  Appropriate  Affect:  Appropriate  Cognitive:  Appropriate  Insight:  Appropriate  Engagement in Group:  Engaged  Modes of Intervention:  Education  Summary of Progress/Problems: Pt goal to feel better, Day 8/10.   Noah Delaine 11/06/2022, 9:13 PM

## 2022-11-06 NOTE — Group Note (Signed)
Date:  11/06/2022 Time:  10:47 AM  Group Topic/Focus:  Goals Group:   The focus of this group is to help patients establish daily goals to achieve during treatment and discuss how the patient can incorporate goal setting into their daily lives to aide in recovery.    Participation Level:  Active  Participation Quality:  Appropriate  Affect:  Appropriate  Cognitive:  Appropriate  Insight: Appropriate  Engagement in Group:  Engaged  Modes of Intervention:  Discussion  Additional Comments:     Reymundo Poll 11/06/2022, 10:47 AM

## 2022-11-06 NOTE — Progress Notes (Addendum)
Patient denies SI, HI and AVH. Rates depression 7/10, hopelessness 5/10 and anxiety 2/10. Patient is preoccupied with being transferred to [facility] but started to accept that he was going to be discharged home. Patient ruminates on all of the loss he has experienced recently in his life. Patient is calm and cooperative, and interactive in the milieu. !5 minute safety checks ongoing.   11/06/22 1200  Psych Admission Type (Psych Patients Only)  Admission Status Voluntary  Psychosocial Assessment  Patient Complaints Anxiety;Depression  Eye Contact Fair  Facial Expression Anxious;Animated  Affect Preoccupied;Anxious  Speech Logical/coherent  Interaction Assertive  Motor Activity Fidgety  Appearance/Hygiene Unremarkable  Behavior Characteristics Cooperative;Anxious  Mood Depressed;Anxious;Preoccupied  Thought Process  Coherency WDL  Content Blaming others  Delusions None reported or observed  Perception WDL  Hallucination None reported or observed  Judgment WDL  Confusion None  Danger to Self  Current suicidal ideation? Denies  Self-Injurious Behavior No self-injurious ideation or behavior indicators observed or expressed   Agreement Not to Harm Self Yes  Description of Agreement verbal  Danger to Others  Danger to Others None reported or observed

## 2022-11-06 NOTE — Progress Notes (Signed)
D: Pt alert and oriented. denies depression and anxiety. Pt denies experiencing any SI/HI, or AVH at this time.   A: Scheduled medications administered to pt, per MD orders. Support and encouragement provided. Frequent verbal contact made. Routine safety checks conducted q15 minutes.   R: No adverse drug reactions noted. Pt verbally contracts for safety at this time. Pt complaint with medications and treatment plan. Pt interacts well with others on the unit. Pt remains safe at this time. Will continue to monitor.  Patient does not want to get discharged but wants to go to [facility]   11/06/22 2300  Psych Admission Type (Psych Patients Only)  Admission Status Voluntary  Psychosocial Assessment  Patient Complaints None  Eye Contact Fair  Facial Expression Other (Comment) (wdl)  Affect Inconsistent with thought content  Speech Logical/coherent  Interaction Assertive  Motor Activity Fidgety  Appearance/Hygiene Unremarkable  Behavior Characteristics Guarded  Mood Suspicious  Thought Process  Coherency WDL  Content Ambivalence  Delusions None reported or observed  Perception WDL  Hallucination None reported or observed  Judgment WDL  Confusion None  Danger to Self  Current suicidal ideation? Denies  Self-Injurious Behavior No self-injurious ideation or behavior indicators observed or expressed   Agreement Not to Harm Self Yes  Description of Agreement verbal  Danger to Others  Danger to Others None reported or observed

## 2022-11-06 NOTE — Progress Notes (Signed)
[facility] MD Progress Note  11/06/2022 10:58 AM Siah Mews  MRN:  [id number]  Principal Problem: Major depressive disorder, recurrent severe without psychotic features (HCC) Diagnosis: Principal Problem:   Major depressive disorder, recurrent severe without psychotic features (HCC)  Reason for admission:  Quentavius Hueston is a 42 year old Caucasian male with prior self diagnosed psychiatric history of anxiety, panic attacks, depression, PTSD, who presents voluntarily to [facility] from [facility] ED after stabilization for worsening depression, anxiety, panic attack, resulting in suicidal ideation by intentional overdosing on 7-8, 500 mg Keppra and self-mutilation.   24-hour chart Review: Past 24 hours of patient's chart was reviewed.  Patient is compliant with scheduled meds. Required Agitation PRNs: none As needed medications: Albuterol given at 2104 yesterday and today at 0646 for wheezing, Nicorette gum given yesterday at 1703 and today at 0834 for smoking cessation. Per RN notes, no documented behavioral issues and is attending group. Patient slept, 7 hours.    Today's assessment notes:   Patient was seen and examined in the office on [address] sitting up in a chair.  He appears calm, pleasant and participating effectively in the exam.  Mood is less depressed with congruent affect.  Observed visibly in the unit attending therapeutic milieu, group activities, going to the window to obtain his medication from the nursing staff.  He reports anxiety as #7/10 and depression as #6 about 10 with 10 being of highest severity.  He reports good appetite and consuming all his meals.  Reports sleeping for 7 hours and being restful.  He is taking his medication of Prozac 40 mg p.o. daily for depression, Risperdal 1 mg p.o. daily, and Risperdal 2 mg p.o. at bedtime for mood stabilization.  Denies somatic discomfort.  He denies SI, HI, or AVH today.  Able to contract for safety.  15  minutes safety checks continues.  Will continue current treatment plan, with expected date of discharge tomorrow 11/07/2022  Total Time spent with patient: 30 minutes  Past Psychiatric History: Previous Psych Diagnoses: Major depressive disorder, recurrent severe without psychotic features, amphetamine abuse, cocaine abuse, substance-induced mood disorder, with suicide attempt.  Prior inpatient treatment: Admitted to [facility] behavioral health in [place] in 2020 for suicide attempt for 1 month.  Current/prior outpatient treatment: None, has scheduled appointment on June 2024 at 12 noon with [place] Prior rehab hx: Rehab for alcohol x 1 month in 2019 Psychotherapy hx: Yes History of suicide: X 4 in the last 3 days, another suicide attempt in 2020 History of homicide or aggression: Denies Psychiatric medication history: Thorazine, fluoxetine, hydroxyzine, risperidone, and trazodone. Psychiatric medication compliance history: Noncompliance Neuromodulation history: Denies  Current Psychiatrist: Denies Current therapist: Seeing Graciella Freer at the [place]  Past Medical History:  Past Medical History:  Diagnosis Date   Anxiety    Hepatitis C    PTSD (post-traumatic stress disorder)     Past Surgical History:  Procedure Laterality Date   FACIAL FRACTURE SURGERY     metal plate under right eye   Family History: History reviewed. No pertinent family history. Family Psychiatric  History: HPI Social History:  Social History   Substance and Sexual Activity  Alcohol Use Not Currently     Social History   Substance and Sexual Activity  Drug Use Not Currently    Social History   Socioeconomic History   Marital status: Single    Spouse name: Not on file   Number of children: Not on file  Years of education: Not on file   Highest education level: Not on file  Occupational History   Not on file  Tobacco Use   Smoking status: Some Days    Types: Cigarettes    Smokeless tobacco: Never  Vaping Use   Vaping Use: Never used  Substance and Sexual Activity   Alcohol use: Not Currently   Drug use: Not Currently   Sexual activity: Not on file  Other Topics Concern   Not on file  Social History Narrative   Not on file   Social Determinants of Health   Financial Resource Strain: Not on file  Food Insecurity: Food Insecurity Present (10/28/2022)   Hunger Vital Sign    Worried About Running Out of Food in the Last Year: Sometimes true    Ran Out of Food in the Last Year: Sometimes true  Transportation Needs: No Transportation Needs (10/28/2022)   PRAPARE - Administrator, Civil Service (Medical): No    Lack of Transportation (Non-Medical): No  Physical Activity: Not on file  Stress: Not on file  Social Connections: Not on file   Additional Social History:    Sleep: Good  Appetite:  Good  Current Medications: Current Facility-Administered Medications  Medication Dose Route Frequency Provider Last Rate Last Admin   acetaminophen (TYLENOL) tablet 650 mg  650 mg Oral Q6H PRN Onuoha, Josephine C, NP       albuterol (VENTOLIN HFA) 108 (90 Base) MCG/ACT inhaler 2 puff  2 puff Inhalation Q4H PRN Dahlia Byes C, NP   2 puff at 11/06/22 0646   alum & mag hydroxide-simeth (MAALOX/MYLANTA) 200-200-20 MG/5ML suspension 30 mL  30 mL Oral Q4H PRN Dahlia Byes C, NP       diphenhydrAMINE (BENADRYL) capsule 50 mg  50 mg Oral TID PRN Earney Navy, NP       Or   diphenhydrAMINE (BENADRYL) injection 50 mg  50 mg Intramuscular TID PRN Earney Navy, NP       FLUoxetine (PROZAC) capsule 40 mg  40 mg Oral Daily Bradden Tadros, Jesusita Oka, FNP   40 mg at 11/06/22 0809   haloperidol (HALDOL) tablet 5 mg  5 mg Oral TID PRN Earney Navy, NP       Or   haloperidol lactate (HALDOL) injection 5 mg  5 mg Intramuscular TID PRN Earney Navy, NP       hydrOXYzine (ATARAX) tablet 50 mg  50 mg Oral Q6H PRN Dahlia Byes C, NP   50 mg at  11/04/22 2056   levETIRAcetam (KEPPRA) tablet 500 mg  500 mg Oral Q12H Massengill, Harrold Donath, MD   500 mg at 11/06/22 1610   lisinopril (ZESTRIL) tablet 10 mg  10 mg Oral Daily Dahlia Byes C, NP   10 mg at 11/06/22 9604   LORazepam (ATIVAN) tablet 2 mg  2 mg Oral TID PRN Earney Navy, NP       Or   LORazepam (ATIVAN) injection 2 mg  2 mg Intramuscular TID PRN Dahlia Byes C, NP       magnesium hydroxide (MILK OF MAGNESIA) suspension 30 mL  30 mL Oral Daily PRN Dahlia Byes C, NP       nicotine polacrilex (NICORETTE) gum 2 mg  2 mg Oral PRN Abbott Pao, Nadir, MD   2 mg at 11/06/22 0834   prazosin (MINIPRESS) capsule 1 mg  1 mg Oral QHS Jashanti Clinkscale C, FNP   1 mg at 11/05/22 2103   risperiDONE (  RISPERDAL M-TABS) disintegrating tablet 1 mg  1 mg Oral Daily Alazay Leicht C, FNP   1 mg at 11/06/22 0809   risperiDONE (RISPERDAL) tablet 2 mg  2 mg Oral QHS Maritza Hosterman C, FNP   2 mg at 11/05/22 2104   traZODone (DESYREL) tablet 100 mg  100 mg Oral QHS Raechal Raben, Jesusita Oka, FNP   100 mg at 11/05/22 2103   Lab Results:  No results found for this or any previous visit (from the past 48 hour(s)).  Blood Alcohol level:  Lab Results  Component Value Date   ETH <10 10/27/2022   ETH <10 10/07/2022   Metabolic Disorder Labs: Lab Results  Component Value Date   HGBA1C 6.1 (H) 10/29/2022   MPG 128 10/29/2022   No results found for: "PROLACTIN" Lab Results  Component Value Date   CHOL 210 (H) 10/29/2022   TRIG 337 (H) 10/29/2022   HDL 50 10/29/2022   CHOLHDL 4.2 10/29/2022   VLDL 67 (H) 10/29/2022   LDLCALC 93 10/29/2022    Physical Findings: AIMS:  , ,  ,  ,    CIWA:    COWS:     Musculoskeletal: Strength & Muscle Tone: within normal limits Gait & Station: normal Patient leans: N/A  Psychiatric Specialty Exam:  Presentation  General Appearance:  Appropriate for Environment; Casual; Fairly Groomed  Eye Contact: Good  Speech: Clear and Coherent; Normal Rate  Speech  Volume: Normal  Handedness: Right  Mood and Affect  Mood: Anxious; Depressed  Affect: Congruent  Thought Process  Thought Processes: Coherent; Linear  Descriptions of Associations:Intact  Orientation:Full (Time, Place and Person)  Thought Content:Logical; Rumination  History of Schizophrenia/Schizoaffective disorder:No  Duration of Psychotic Symptoms:No data recorded Hallucinations:Hallucinations: None Description of Visual Hallucinations: Denies  Ideas of Reference:None  Suicidal Thoughts:Suicidal Thoughts: No SI Passive Intent and/or Plan: -- (Denies)  Homicidal Thoughts:Homicidal Thoughts: No  Sensorium  Memory: Immediate Good; Recent Good  Judgment: Fair  Insight: Fair  Art therapist  Concentration: Good  Attention Span: Good  Recall: Fair  Fund of Knowledge: Fair  Language: Good  Psychomotor Activity  Psychomotor Activity: Psychomotor Activity: Normal  Assets  Assets: Communication Skills; Physical Health; Resilience; Desire for Improvement  Sleep  Sleep: Sleep: Good Number of Hours of Sleep: 7  Physical Exam: Physical Exam Vitals and nursing note reviewed.  HENT:     Head: Normocephalic.     Nose: Nose normal.     Mouth/Throat:     Mouth: Mucous membranes are moist.     Pharynx: Oropharynx is clear.  Eyes:     Pupils: Pupils are equal, round, and reactive to light.  Cardiovascular:     Rate and Rhythm: Normal rate.     Pulses: Normal pulses.  Pulmonary:     Effort: Pulmonary effort is normal.  Abdominal:     Comments: Deferred  Genitourinary:    Comments: Deferred Musculoskeletal:        General: Normal range of motion.     Cervical back: Normal range of motion.  Skin:    General: Skin is warm.  Neurological:     General: No focal deficit present.     Mental Status: He is alert and oriented to person, place, and time.  Psychiatric:        Mood and Affect: Mood normal.        Behavior: Behavior normal.     Review of Systems  Constitutional:  Negative for fever.  HENT: Negative.  Eyes:  Negative for blurred vision.  Respiratory:  Negative for shortness of breath.   Cardiovascular:  Negative for chest pain.  Gastrointestinal:  Negative for nausea and vomiting.  Genitourinary: Negative.   Musculoskeletal:  Negative for myalgias.  Skin: Negative.  Negative for rash.  Neurological:  Positive for seizures (Hx of seizures).  Endo/Heme/Allergies:        See allergy listing  Psychiatric/Behavioral:  Positive for depression. The patient is nervous/anxious.    Blood pressure (!) 150/103, pulse (!) 108, temperature 97.8 F (36.6 C), temperature source Oral, resp. rate (!) 28, height 6\' 1"  (1.854 m), weight 83 kg, SpO2 98 %. Body mass index is 24.14 kg/m.  Treatment Plan Summary: Daily contact with patient to assess and evaluate symptoms and progress in treatment and Medication management  Physician Treatment Plan for Primary Diagnosis:  Assessment: Major depressive disorder, recurrent severe without psychotic features (HCC) Generalized anxiety disorder PTSD Seizure disorder   Plan: Medications: Continue Prozac 40 mg p.o. daily for depression,  Continue hydroxyzine 50 mg p.o. every 6 hours as needed for anxiety Continue Risperdal 1 mg p.o. daily for mood stabilization Continue Risperdal 2 mg p.o. at bedtime for mood stabilization Continue trazodone 100 mg p.o. at bedtime for insomnia Continue prazosin 1 mg p.o. q. nightly for nightmares   Other medical problems: Albuterol Ventolin HFA 108 90 base MCG/ACT inhaler 2 puffs into the lungs every 4 hours as needed for wheezing or shortness of breath Keppra 500 mg tablet 1 p.o. 2 times daily for seizures activity Lisinopril 10 mg tablet by mouth daily for high blood pressure   Agitation protocol: Benadryl capsule 50 mg p.o. 3 times daily as needed agitation or Benadryl injection 50 mg IM 3 times daily as needed agitation   Haldol  tablets 5 mg po 3 times daily as needed agitation or Haldol lactate injection 5 mg IM 3 times daily as needed agitation   Lorazepam tablet 2 mg p.o. 3 times daily as needed agitation or Lorazepam injection 2 mg IM 3 times daily as needed agitation   Other PRN Medications -Acetaminophen 650 mg every 6 as needed/mild pain -Maalox 30 mL oral every 4 as needed/digestion -Magnesium hydroxide 30 mL daily as needed/mild constipation --Nicotine gum 2 mg oral for smoking cessation   -- The risks/benefits/side-effects/alternatives to this medication were discussed in detail with the patient and time was given for questions. The patient consents to medication trial.              -- Encouraged patient to participate  in unit milieu and in scheduled group therapies    Safety and Monitoring: Voluntary admission to inpatient psychiatric unit for safety, stabilization and treatment Daily contact with patient to assess and evaluate symptoms and progress in treatment Patient's case to be discussed in multi-disciplinary team meeting Observation Level : q15 minute checks Vital signs: q12 hours Precautions: suicide, but pt currently verbally contracts for safety on unit    Discharge Planning: Social work and case management to assist with discharge planning and identification of hospital follow-up needs prior to discharge Estimated LOS: 5-7 days Discharge Concerns: Need to establish a safety plan; Medication compliance and effectiveness Discharge Goals: Return home with outpatient referrals for mental health follow-up including medication management/psychotherapy.    Long Term Goal(s): Improvement in symptoms so as ready for discharge   Short Term Goals: Ability to identify changes in lifestyle to reduce recurrence of condition will improve, Ability to verbalize feelings will improve, Ability to disclose  and discuss suicidal ideas, Ability to demonstrate self-control will improve, Ability to identify and  develop effective coping behaviors will improve, Ability to maintain clinical measurements within normal limits will improve, Compliance with prescribed medications will improve, and Ability to identify triggers associated with substance abuse/mental health issues will improve   Physician Treatment Plan for Secondary Diagnosis: Principal Problem:   Major depressive disorder, recurrent severe without psychotic features (HCC)   I certify that inpatient services furnished can reasonably be expected to improve the patient's condition.     Cecilie Lowers, FNP 11/06/2022, 10:58 AMPatient ID: [name], male   DOB: June 17, 1980, 42 y.o.   MRN: [id number] Patient ID: Saulo Pittinger, male   DOB: 1980-11-12, 42 y.o.   MRN: [id number] Patient ID: Cristofer Rathgeber, male   DOB: 04-Jul-1980, 42 y.o.   MRN: [id number] Patient ID: Rmani Salvas, male   DOB: January 03, 1981, 42 y.o.   MRN: [id number] Patient ID: Jaxstyn Stetter, male   DOB: Jun 15, 1980, 42 y.o.   MRN: [id number] Patient ID: Thomas Buitrago, male   DOB: [birth year], 42 y.o.   MRN: [id number] Patient ID: Orien Kurtyka, male   DOB: June 14, 1980, 42 y.o.   MRN: [id number]

## 2022-11-07 MED ORDER — PRAZOSIN HCL 1 MG PO CAPS: 1.0000 mg | ORAL_CAPSULE | Freq: Every day | ORAL | 0 refills | Status: DC

## 2022-11-07 MED ORDER — TRAZODONE HCL 100 MG PO TABS: 100.0000 mg | ORAL_TABLET | Freq: Every day | ORAL | 0 refills | Status: DC

## 2022-11-07 MED ORDER — LEVETIRACETAM 500 MG PO TABS: 500.0000 mg | ORAL_TABLET | Freq: Two times a day (BID) | ORAL | 0 refills | Status: DC

## 2022-11-07 MED ORDER — RISPERIDONE 2 MG PO TABS: 2.0000 mg | ORAL_TABLET | Freq: Two times a day (BID) | ORAL | 0 refills | Status: DC

## 2022-11-07 MED ORDER — FLUOXETINE HCL 40 MG PO CAPS: 40.0000 mg | ORAL_CAPSULE | Freq: Every day | ORAL | 0 refills | Status: DC

## 2022-11-07 NOTE — Progress Notes (Signed)
Patient denies SI, HI and AVH. Reports no depression or anxiety but does still present with preoccupation with being transferred to [place]. Demonstrates more acceptance that he is being discharged to home today.  11/07/22 0800  Psych Admission Type (Psych Patients Only)  Admission Status Voluntary  Psychosocial Assessment  Patient Complaints None  Eye Contact None  Facial Expression Animated  Affect Appropriate to circumstance;Preoccupied  Speech Logical/coherent  Interaction Assertive  Motor Activity Fidgety  Appearance/Hygiene Unremarkable  Behavior Characteristics Anxious  Mood Preoccupied  Thought Process  Coherency WDL  Content Blaming others  Delusions None reported or observed  Perception WDL  Hallucination None reported or observed  Judgment WDL  Confusion None  Danger to Self  Current suicidal ideation? Denies  Self-Injurious Behavior No self-injurious ideation or behavior indicators observed or expressed   Agreement Not to Harm Self Yes  Description of Agreement verbal

## 2022-11-07 NOTE — BHH Suicide Risk Assessment (Signed)
[facility] Discharge Suicide Risk Assessment   Principal Problem: Major depressive disorder, recurrent severe without psychotic features (HCC) Discharge Diagnoses: Principal Problem:   Major depressive disorder, recurrent severe without psychotic features (HCC)   Total Time spent with patient: 30 minutes  Musculoskeletal: Strength & Muscle Tone: within normal limits Gait & Station: normal Patient leans: N/A  Psychiatric Specialty Exam  Presentation  General Appearance:  Casual  Eye Contact: Fair  Speech: Clear and Coherent  Speech Volume: Normal  Handedness: Right   Mood and Affect  Mood: Anxious  Duration of Depression Symptoms: Greater than two weeks  Affect: Labile   Thought Process  Thought Processes: Coherent  Descriptions of Associations:Intact  Orientation:Full (Time, Place and Person)  Thought Content:Rumination  History of Schizophrenia/Schizoaffective disorder:No  Duration of Psychotic Symptoms:No data recorded Hallucinations:Hallucinations: None Description of Visual Hallucinations: Denies  Ideas of Reference:None  Suicidal Thoughts:Suicidal Thoughts: Yes, Passive SI Passive Intent and/or Plan: Without Intent; Without Plan (Patient has chronic passive suicidal ideations without plans.  He also verbalizes these when he does not get his way and tends to be manipulative.)  Homicidal Thoughts:Homicidal Thoughts: No   Sensorium  Memory: Immediate Fair; Remote Fair; Recent Fair  Judgment: Poor  Insight: Poor   Executive Functions  Concentration: Fair  Attention Span: Fair  Recall: [employer] of Knowledge: Fair  Language: Fair   Psychomotor Activity  Psychomotor Activity: Psychomotor Activity: Normal   Assets  Assets: Communication Skills; Housing   Sleep  Sleep: Sleep: Good Number of Hours of Sleep: 7   Physical Exam: Physical Exam Vitals and nursing note reviewed.  Constitutional:      Appearance:  Normal appearance.  Neurological:     Mental Status: He is alert and oriented to person, place, and time. Mental status is at baseline.    Review of Systems  Psychiatric/Behavioral:  The patient is nervous/anxious.   All other systems reviewed and are negative.  Blood pressure 122/83, pulse (!) 106, temperature 98.2 F (36.8 C), temperature source Oral, resp. rate 20, height 6\' 1"  (1.854 m), weight 83 kg, SpO2 98 %. Body mass index is 24.14 kg/m.  Mental Status Per Nursing Assessment::   On Admission:  NA  Demographic Factors:  Male, Caucasian, Low socioeconomic status, and Living alone  Loss Factors: NA  Historical Factors: Prior suicide attempts and Impulsivity  Risk Reduction Factors:   Positive social support  Continued Clinical Symptoms:  Depression:   Impulsivity Personality Disorders:   Cluster B More than one psychiatric diagnosis  Cognitive Features That Contribute To Risk:  Thought constriction (tunnel vision)    Suicide Risk:  Mild:  Suicidal ideation of limited frequency, intensity, duration, and specificity.  There are no identifiable plans, no associated intent, mild dysphoria and related symptoms, good self-control (both objective and subjective assessment), few other risk factors, and identifiable protective factors, including available and accessible social support.   Follow-up Information     [facility] Go to.   Why: Please call or go to this provider to schedule an appointment for therapy and medication management services. Walk in times are Monday through Friday, from 8 am to 3 pm, (closed for lunch from 12 -1).  You may also call [phone]. Contact information: [address]  [address] [place] [address] [phone]                 Plan Of Care/Follow-up recommendations:  Activity:  As tolerated  Rex Kras, MD 11/07/2022, 10:05 AM

## 2022-11-07 NOTE — Progress Notes (Signed)
Discharge Note:  Patient denies SI/HI/AVH at this time. Discharge instructions, AVS, prescriptions, and transition record gone over with patient. Patient agrees to comply with medication management and any follow up. Patient belongings returned to patient. Patient questions and concerns addressed and answered. Patient ambulatory off unit. Patient discharged to home.

## 2022-11-07 NOTE — Progress Notes (Addendum)
  [facility] Adult Case Management Discharge Plan :  Will you be returning to the same living situation after discharge:  Yes,  Patient to discharge to place of residence  At discharge, do you have transportation home?: Yes,  Patient provided with transportation to residence.   Patient has signed the [facility] transportation voucher which has been sent to medical records.  Do you have the ability to pay for your medications: Yes,  [employer] private insurance   Release of information consent forms completed and in the chart;  Patient's signature needed at discharge.  Patient to Follow up at:  Follow-up Information     [facility]. Go to.   Why: Please call or go to this provider to schedule an appointment for therapy and medication management services. Walk in times are Monday through Friday, from 8 am to 3 pm, (closed for lunch from 12 -1).  You may also call [phone]. Contact information: [address]  [address] [place] [address] [phone]                 Next level of care provider has access to [facility] Link:no  Safety Planning and Suicide Prevention discussed: Yes,  SPE completed with patient, declined consent for CSW to reach family/friend.    Has patient been referred to the Quitline?: Patient refused referral for treatment Tobacco Use: High Risk (10/28/2022)   Patient History    Smoking Tobacco Use: Some Days    Smokeless Tobacco Use: Never    Passive Exposure: Not on file   Patient has been referred for addiction treatment: No known substance use disorder. Social History   Substance and Sexual Activity  Drug Use Not Currently   Social History   Substance and Sexual Activity  Alcohol Use Not Currently   Almedia Balls 11/07/2022, 10:34 AM

## 2022-11-07 NOTE — Discharge Summary (Signed)
Physician Discharge Summary Note  Patient:  Raymond [name] is an 42 y.o., male MRN:  [id number] DOB:  07-Dec-1980 Patient phone:  [phone] (home)  Patient address:   [address] [place] [address],  Total Time spent with patient: 30 minutes  Date of Admission:  10/28/2022 Date of Discharge: 11/07/2022  Reason for Admission:   Raymond [name] is a 24 year old Caucasian male with prior self diagnosed psychiatric history of anxiety, panic attacks, depression, PTSD, who presents [facility] to [facility] from [facility] ED after stabilization for worsening depression, anxiety, panic attack, resulting in suicidal ideation by intentional overdosing on 7-8, 500 mg Keppra and self-mutilation.   Principal Problem: Major depressive disorder, recurrent severe without psychotic features [facility]) Discharge Diagnoses: Principal Problem:   Major depressive disorder, recurrent severe without psychotic features (HCC)   Past Psychiatric History: Please see H&P  Past Medical History:  Past Medical History:  Diagnosis Date   Anxiety    Hepatitis C    PTSD (post-traumatic stress disorder)     Past Surgical History:  Procedure Laterality Date   FACIAL FRACTURE SURGERY     metal plate under right eye   Family History: History reviewed. No pertinent family history. Family Psychiatric  History: Please see H&P Social History:  Social History   Substance and Sexual Activity  Alcohol Use Not Currently     Social History   Substance and Sexual Activity  Drug Use Not Currently    Social History   Socioeconomic History   Marital status: Single    Spouse name: Not on file   Number of children: Not on file   Years of education: Not on file   Highest education level: Not on file  Occupational History   Not on file  Tobacco Use   Smoking status: Some Days    Types: Cigarettes   Smokeless tobacco: Never  Vaping Use   Vaping Use: Never used   Substance and Sexual Activity   Alcohol use: Not Currently   Drug use: Not Currently   Sexual activity: Not on file  Other Topics Concern   Not on file  Social History Narrative   Not on file   Social Determinants of Health   Financial Resource Strain: Not on file  Food Insecurity: Food Insecurity Present (10/28/2022)   Hunger Vital Sign    Worried About Running Out of Food in the Last Year: Sometimes true    Ran Out of Food in the Last Year: Sometimes true  Transportation Needs: No Transportation Needs (10/28/2022)   PRAPARE - Administrator, Civil Service (Medical): No    Lack of Transportation (Non-Medical): No  Physical Activity: Not on file  Stress: Not on file  Social Connections: Not on file    Hospital Course:  During the patient's hospitalization, patient had extensive initial psychiatric evaluation, and follow-up psychiatric evaluations every day.  Psychiatric diagnoses provided upon initial assessment: Depression and suicidal ideations with a history of self-mutilation.  Patient's psychiatric medications were adjusted on admission: His home medications were titrated.  Prozac was increased to 40 mg a day.  Keppra was continued.  Patient's Risperdal was changed to 2 mg twice a day.  During the hospitalization, other adjustments were made to the patient's psychiatric medication regimen: Please see above.  Patient's care was discussed during the interdisciplinary team meeting every day during the hospitalization.  The patient denies having side effects to prescribed psychiatric medication.  Gradually, patient started  adjusting to milieu. The patient was evaluated each day by a clinical provider to ascertain response to treatment. Improvement was noted by the patient's report of decreasing symptoms, improved sleep and appetite, affect, medication tolerance, behavior, and participation in unit programming.  Patient was asked each day to complete a self inventory  noting mood, mental status, pain, new symptoms, anxiety and concerns.    Symptoms were reported as significantly decreased or resolved completely by discharge.   On day of discharge, the patient reports that their mood is stable. The patient denied having suicidal thoughts for more than 48 hours prior to discharge.  Patient denies having homicidal thoughts.  Patient denies having auditory hallucinations.  Patient denies any visual hallucinations or other symptoms of psychosis. The patient was motivated to continue taking medication with a goal of continued improvement in mental health.   The patient reports their target psychiatric symptoms of depression and suicidal ideations responded well to the psychiatric medications, and the patient reports overall benefit other psychiatric hospitalization. Supportive psychotherapy was provided to the patient. The patient also participated in regular group therapy while hospitalized. Coping skills, problem solving as well as relaxation therapies were also part of the unit programming.  Labs were reviewed with the patient, and abnormal results were discussed with the patient.  The patient is able to verbalize their individual safety plan to this provider.  # It is recommended to the patient to continue psychiatric medications as prescribed, after discharge from the hospital.    # It is recommended to the patient to follow up with your outpatient psychiatric provider and PCP.  # It was discussed with the patient, the impact of alcohol, drugs, tobacco have been there overall psychiatric and medical wellbeing, and total abstinence from substance use was recommended the patient.ed.  # Prescriptions provided or sent directly to preferred pharmacy at discharge. Patient agreeable to plan. Given opportunity to ask questions. Appears to feel comfortable with discharge.    # In the event of worsening symptoms, the patient is instructed to call the crisis hotline, 911  and or go to the nearest ED for appropriate evaluation and treatment of symptoms. To follow-up with primary care provider for other medical issues, concerns and or health care needs  # Patient was discharged home with a plan to follow up as noted below.   Physical Findings: AIMS:  , ,  ,  ,    CIWA:    COWS:     Musculoskeletal: Strength & Muscle Tone: within normal limits Gait & Station: normal Patient leans: N/A   Psychiatric Specialty Exam:  Presentation  General Appearance:  Casual  Eye Contact: Fair  Speech: Clear and Coherent  Speech Volume: Normal  Handedness: Right   Mood and Affect  Mood: Anxious  Affect: Labile   Thought Process  Thought Processes: Coherent  Descriptions of Associations:Intact  Orientation:Full (Time, Place and Person)  Thought Content:Rumination  History of Schizophrenia/Schizoaffective disorder:No  Duration of Psychotic Symptoms:No data recorded Hallucinations:Hallucinations: None Description of Visual Hallucinations: Denies  Ideas of Reference:None  Suicidal Thoughts:Suicidal Thoughts: Yes, Passive SI Passive Intent and/or Plan: Without Intent; Without Plan (Patient has chronic passive suicidal ideations without plans.  He also verbalizes these when he does not get his way and tends to be manipulative.)  Homicidal Thoughts:Homicidal Thoughts: No   Sensorium  Memory: Immediate Fair; Remote Fair; Recent Fair  Judgment: Poor  Insight: Poor   Executive Functions  Concentration: Fair  Attention Span: Fair  Recall: [employer]  of Knowledge: Fair  Language: Fair   Lexicographer Activity: Psychomotor Activity: Normal   Assets  Assets: Communication Skills; Housing   Sleep  Sleep: Sleep: Good Number of Hours of Sleep: 7    Physical Exam: Physical Exam Constitutional:      Appearance: Normal appearance.  Neurological:     General: No focal deficit present.      Mental Status: He is alert and oriented to person, place, and time. Mental status is at baseline.  Psychiatric:        Mood and Affect: Mood normal.        Behavior: Behavior normal.    Review of Systems  Psychiatric/Behavioral:  The patient is nervous/anxious.   All other systems reviewed and are negative.  Blood pressure 122/83, pulse (!) 106, temperature 98.2 F (36.8 C), temperature source Oral, resp. rate 20, height 6\' 1"  (1.854 m), weight 83 kg, SpO2 98 %. Body mass index is 24.14 kg/m.   Social History   Tobacco Use  Smoking Status Some Days   Types: Cigarettes  Smokeless Tobacco Never   Tobacco Cessation:  A prescription for an FDA-approved tobacco cessation medication provided at discharge   Blood Alcohol level:  Lab Results  Component Value Date   [facility] <10 10/27/2022   ETH <10 10/07/2022    Metabolic Disorder Labs:  Lab Results  Component Value Date   HGBA1C 6.1 (H) 10/29/2022   MPG 128 10/29/2022   No results found for: "PROLACTIN" Lab Results  Component Value Date   CHOL 210 (H) 10/29/2022   TRIG 337 (H) 10/29/2022   HDL 50 10/29/2022   CHOLHDL 4.2 10/29/2022   VLDL 67 (H) 10/29/2022   LDLCALC 93 10/29/2022    See Psychiatric Specialty Exam and Suicide Risk Assessment completed by Attending Physician prior to discharge.  Discharge destination:  Home  Is patient on multiple antipsychotic therapies at discharge:  No   Has Patient had three or more failed trials of antipsychotic monotherapy by history:  No  Recommended Plan for Multiple Antipsychotic Therapies: NA  Discharge Instructions     Diet - low sodium heart healthy   Complete by: As directed    Increase activity slowly   Complete by: As directed       Allergies as of 11/07/2022       Reactions   Other Itching, Rash, Other (See Comments)   Peanuts   Tylenol [acetaminophen] Rash        Medication List     STOP taking these medications    chlorproMAZINE 10 MG  tablet Commonly known as: THORAZINE       TAKE these medications      Indication  albuterol 108 (90 Base) MCG/ACT inhaler Commonly known as: VENTOLIN HFA Inhale 2 puffs into the lungs every 4 (four) hours as needed for wheezing or shortness of breath.  Indication: Chronic Obstructive Lung Disease   FLUoxetine 40 MG capsule Commonly known as: PROZAC Take 1 capsule (40 mg total) by mouth daily. Start taking on: November 08, 2022 What changed:  medication strength how much to take  Indication: Depression   hydrOXYzine 50 MG tablet Commonly known as: ATARAX Take 1 tablet (50 mg total) by mouth every 6 (six) hours as needed for anxiety.  Indication: Feeling Anxious   levETIRAcetam 500 MG tablet Commonly known as: KEPPRA Take 1 tablet (500 mg total) by mouth every 12 (twelve) hours. What changed: when to take this  Indication: Partial Onset Seizure  lisinopril 10 MG tablet Commonly known as: ZESTRIL Take 1 tablet (10 mg total) by mouth daily.  Indication: High Blood Pressure Disorder   prazosin 1 MG capsule Commonly known as: MINIPRESS Take 1 capsule (1 mg total) by mouth at bedtime.  Indication: Frightening Dreams   risperiDONE 2 MG tablet Commonly known as: RISPERDAL Take 1 tablet (2 mg total) by mouth 2 (two) times daily. What changed:  medication strength how much to take when to take this Another medication with the same name was removed. Continue taking this medication, and follow the directions you see here.  Indication: Major Depressive Disorder   traZODone 100 MG tablet Commonly known as: DESYREL Take 1 tablet (100 mg total) by mouth at bedtime. What changed:  medication strength how much to take  Indication: Trouble Sleeping        Follow-up Information     [facility] Go to.   Why: Please call or go to this provider to schedule an appointment for therapy and medication management services. Walk in times are Monday through Friday, from 8 am to 3 pm,  (closed for lunch from 12 -1).  You may also call [phone]. Contact information: [address]  [address] [place] [address] [phone]                 Follow-up recommendations:  Activity:  As tolerated  Comments: Jhamir was seen and evaluated on a daily basis.  He remained cooperative and compliant and was quite pleasant and actively participated in groups and individual sessions.  He seemed to thrive and milieu and enjoyed it.  He denied any active suicidal or homicidal ideations until discharge planning was started.  He started becoming anxious reporting that he was not ready.  He then demanded to be discharged so he can go to [facility] to be readmitted.  Given his high level of anxiety he discharged on Friday, 11/07/2022 was discussed on 11/05/2022 in anticipation of giving him adequate time to plan and prepare for discharge and further medications to be effective.  In the last 48 hours patient was noted to be sleeping well and had no self-injurious behavior or ideations.  He was appropriateAlthough somewhat fidgety.  He clearly denied any depression or anxiety and denied experiencing any suicidal or homicidal ideations or hallucinations.  Once again at the time of discharge patient started negotiating an additional few days of hospitalization and claims that he needs to be in the hospital for at least a month because he does well with company with people around him.  He was encouraged to go to outpatient treatment and if required meal and go to the partial program.  He kept threatening to go to [facility] upon discharge however wants the discharge date was scheduled, he settled down to discharge planning and did not express any further suicidal or homicidal ideations. Prognosis is poor to guarded.  Signed: Rex Kras, MD 11/07/2022, 10:11 AM

## 2022-11-20 ENCOUNTER — Ambulatory Visit (HOSPITAL_COMMUNITY)
Admission: EM | Admit: 2022-11-20 | Discharge: 2022-11-21 | Disposition: A | Discharge status: Home / Self Care | Payer: BLUE CROSS/BLUE SHIELD | Attending: Family | Admitting: Family

## 2022-11-20 DIAGNOSIS — Z9151 Personal history of suicidal behavior: Secondary | ICD-10-CM | POA: Insufficient documentation

## 2022-11-20 DIAGNOSIS — F4310 Post-traumatic stress disorder, unspecified: Secondary | ICD-10-CM | POA: Insufficient documentation

## 2022-11-20 DIAGNOSIS — Z87820 Personal history of traumatic brain injury: Secondary | ICD-10-CM | POA: Insufficient documentation

## 2022-11-20 DIAGNOSIS — F1090 Alcohol use, unspecified, uncomplicated: Secondary | ICD-10-CM | POA: Insufficient documentation

## 2022-11-20 DIAGNOSIS — F332 Major depressive disorder, recurrent severe without psychotic features: Secondary | ICD-10-CM | POA: Diagnosis not present

## 2022-11-20 DIAGNOSIS — Z79899 Other long term (current) drug therapy: Secondary | ICD-10-CM | POA: Insufficient documentation

## 2022-11-20 DIAGNOSIS — R45851 Suicidal ideations: Secondary | ICD-10-CM | POA: Insufficient documentation

## 2022-11-20 DIAGNOSIS — F1290 Cannabis use, unspecified, uncomplicated: Secondary | ICD-10-CM | POA: Diagnosis not present

## 2022-11-20 DIAGNOSIS — I4510 Unspecified right bundle-branch block: Secondary | ICD-10-CM | POA: Insufficient documentation

## 2022-11-20 LAB — CBC WITH DIFFERENTIAL/PLATELET
Abs Immature Granulocytes: 0.03 10*3/uL (ref 0.00–0.07)
Basophils Absolute: 0 10*3/uL (ref 0.0–0.1)
Basophils Relative: 0 %
Eosinophils Absolute: 0.1 10*3/uL (ref 0.0–0.5)
Eosinophils Relative: 1 %
HCT: 47 % (ref 39.0–52.0)
Hemoglobin: 16.1 g/dL (ref 13.0–17.0)
Immature Granulocytes: 0 %
Lymphocytes Relative: 20 %
Lymphs Abs: 1.6 10*3/uL (ref 0.7–4.0)
MCH: 30.9 pg (ref 26.0–34.0)
MCHC: 34.3 g/dL (ref 30.0–36.0)
MCV: 90.2 fL (ref 80.0–100.0)
Monocytes Absolute: 0.7 10*3/uL (ref 0.1–1.0)
Monocytes Relative: 10 %
Neutro Abs: 5.2 10*3/uL (ref 1.7–7.7)
Neutrophils Relative %: 69 %
Platelets: 308 10*3/uL (ref 150–400)
RBC: 5.21 MIL/uL (ref 4.22–5.81)
RDW: 13.1 % (ref 11.5–15.5)
WBC: 7.6 10*3/uL (ref 4.0–10.5)
nRBC: 0 % (ref 0.0–0.2)

## 2022-11-20 LAB — POCT URINE DRUG SCREEN - MANUAL ENTRY (I-SCREEN)
POC Amphetamine UR: NOT DETECTED
POC Buprenorphine (BUP): NOT DETECTED
POC Cocaine UR: NOT DETECTED
POC Marijuana UR: POSITIVE — AB
POC Methadone UR: NOT DETECTED
POC Methamphetamine UR: NOT DETECTED
POC Morphine: NOT DETECTED
POC Oxazepam (BZO): NOT DETECTED
POC Oxycodone UR: NOT DETECTED
POC Secobarbital (BAR): NOT DETECTED

## 2022-11-20 LAB — COMPREHENSIVE METABOLIC PANEL
ALT: 21 U/L (ref 0–44)
AST: 22 U/L (ref 15–41)
Albumin: 4.1 g/dL (ref 3.5–5.0)
Alkaline Phosphatase: 89 U/L (ref 38–126)
Anion gap: 11 (ref 5–15)
BUN: 17 mg/dL (ref 6–20)
CO2: 20 mmol/L — ABNORMAL LOW (ref 22–32)
Calcium: 9.2 mg/dL (ref 8.9–10.3)
Chloride: 107 mmol/L (ref 98–111)
Creatinine, Ser: 0.74 mg/dL (ref 0.61–1.24)
GFR, Estimated: >60 mL/min (ref >60)
Glucose, Bld: 124 mg/dL — ABNORMAL HIGH (ref 70–99)
Potassium: 4.2 mmol/L (ref 3.5–5.1)
Sodium: 138 mmol/L (ref 135–145)
Total Bilirubin: 0.5 mg/dL (ref 0.3–1.2)
Total Protein: 6.7 g/dL (ref 6.5–8.1)

## 2022-11-20 LAB — ETHANOL: Alcohol, Ethyl (B): <10 mg/dL (ref <10)

## 2022-11-20 LAB — TSH: TSH: 0.896 u[IU]/mL (ref 0.350–4.500)

## 2022-11-20 LAB — MAGNESIUM: Magnesium: 2.5 mg/dL — ABNORMAL HIGH (ref 1.7–2.4)

## 2022-11-20 MED ORDER — LEVETIRACETAM 500 MG PO TABS
500.0000 mg | ORAL_TABLET | Freq: Two times a day (BID) | ORAL | Status: DC
  Administered 2022-11-20 – 2022-11-21 (×2): 500 mg via ORAL
  Filled 2022-11-20 (×2): qty 1

## 2022-11-20 MED ORDER — RISPERIDONE 2 MG PO TABS
2.0000 mg | ORAL_TABLET | Freq: Two times a day (BID) | ORAL | Status: DC
  Administered 2022-11-20 – 2022-11-21 (×2): 2 mg via ORAL
  Filled 2022-11-20 (×2): qty 1

## 2022-11-20 MED ORDER — LISINOPRIL 10 MG PO TABS
10.0000 mg | ORAL_TABLET | Freq: Every day | ORAL | Status: DC
  Administered 2022-11-21: 10 mg via ORAL
  Filled 2022-11-20: qty 1

## 2022-11-20 MED ORDER — FLUOXETINE HCL 20 MG PO CAPS
40.0000 mg | ORAL_CAPSULE | Freq: Every day | ORAL | Status: DC
  Administered 2022-11-21: 40 mg via ORAL
  Filled 2022-11-20: qty 2

## 2022-11-20 MED ORDER — TRAZODONE HCL 100 MG PO TABS
100.0000 mg | ORAL_TABLET | Freq: Every day | ORAL | Status: DC
  Administered 2022-11-20: 100 mg via ORAL
  Filled 2022-11-20: qty 1

## 2022-11-20 MED ORDER — ALBUTEROL SULFATE HFA 108 (90 BASE) MCG/ACT IN AERS: 2.0000 | INHALATION_SPRAY | RESPIRATORY_TRACT | Status: DC | PRN

## 2022-11-20 MED ORDER — NICOTINE 14 MG/24HR TD PT24: 14.0000 mg | MEDICATED_PATCH | Freq: Every day | TRANSDERMAL | Status: DC

## 2022-11-20 MED ORDER — MAGNESIUM HYDROXIDE 400 MG/5ML PO SUSP: 30.0000 mL | Freq: Every day | ORAL | Status: DC | PRN

## 2022-11-20 MED ORDER — HYDROXYZINE HCL 25 MG PO TABS: 50.0000 mg | ORAL_TABLET | Freq: Four times a day (QID) | ORAL | Status: DC | PRN

## 2022-11-20 MED ORDER — NICOTINE POLACRILEX 2 MG MT GUM: 2.0000 mg | CHEWING_GUM | Freq: Every day | OROMUCOSAL | Status: DC | PRN

## 2022-11-20 MED ORDER — PRAZOSIN HCL 1 MG PO CAPS
1.0000 mg | ORAL_CAPSULE | Freq: Every day | ORAL | Status: DC
  Administered 2022-11-20: 1 mg via ORAL
  Filled 2022-11-20: qty 1

## 2022-11-20 MED ORDER — ALUM & MAG HYDROXIDE-SIMETH 200-200-20 MG/5ML PO SUSP: 30.0000 mL | ORAL | Status: DC | PRN

## 2022-11-20 NOTE — ED Notes (Signed)
Patient is lying quietly in bed watching television, no distress noted will continue to monitor patient for safety

## 2022-11-20 NOTE — Progress Notes (Signed)
   11/20/22 1712  [facility] Triage Screening (Walk-ins at [facility] only)  How Did You Hear About [place]? Legal System  What Is the Reason for Your Visit/Call Today? Pt presents to [facility] escorted by [employer] due ongoing depression symptoms triggered by the passing of his mother 2 years ago. Pt reports he was feeling suicidal today but does not currently have a plan. Pt states that he did not want to come to this facility he wanted to go to [facility]. Pt asked if he were to admitted would he be able to go to the hospital of his choice. Pt was informed that this would be determined based on bed availability. Pt states that if he cannot be admitted at [facility] then he would like a bus [place] and he would like to leave. Provider notified via secure chat. Pt reoorts he has lung cancer and has difficulty breathing at times. Pt denies HI and AVH.  How Long Has This Been Causing You Problems? <Week  Have You Recently Had Any Thoughts About Hurting Yourself? Yes  How long ago did you have thoughts about hurting yourself? today  Are You Planning to Commit Suicide/Harm Yourself At This time? No  Have you Recently Had Thoughts About Hurting Someone Karolee Ohs? No  Are You Planning To Harm Someone At This Time? No  Are you currently experiencing any auditory, visual or other hallucinations? No  Have You Used Any Alcohol or Drugs in the Past 24 Hours? No  Do you have any current medical co-morbidities that require immediate attention? No  Clinician description of patient physical appearance/behavior: calm ,cooperative  What Do You Feel Would Help You the Most Today? Treatment for Depression or other mood problem  If access to [facility] was not available, would you have sought care in the Emergency Department? No  Determination of Need Routine (7 days)  Options For Referral Outpatient [facility] Urgent Care

## 2022-11-20 NOTE — ED Notes (Signed)
Patient lying in bed resting but awake. Denies complaints at this time. No distress noted. Will continue to monitor.

## 2022-11-20 NOTE — ED Provider Notes (Signed)
[facility] Continuous Assessment Admission H&P  Date: 11/20/22 Patient Name: [name] MRN: [id number] Chief Complaint: passive suicidal ideations  Diagnoses:  Final diagnoses:  Major depressive disorder, recurrent, severe without psychotic behavior [facility])    HPI: [name] is a 42 year old male.  Patient presents voluntarily to [facility] health, transported by Patent examiner.  Patient is assessed by this nurse practitioner face-to-face.  He is seated in assessment area, no apparent distress.  He is alert and oriented, pleasant and cooperative during assessment.  Patient presents with euthymic mood, congruent affect.  Behavior childlike.  Patient exhibits repetitive hiccup-like sounds only when speaking.  Chart reviewed and patient discussed with Dr. Nelly Rout on 11/20/2022. Per medical record this is potentially his baseline.  Patient states "this is all a misunderstanding to be honest and I am feeling just fine."  Patient reports he received a phone call from a representative of the veterans administration earlier today.  Patient states "it is a misunderstanding, the lady went through my record and I guess she felt I was doing bad, she feared for my health."  Per medical record, patient endorsed suicidal ideation earlier this date.  Patient verbalized to representative of veterans administration, on telephone, earlier this date that he will "likely attempt to kill himself if he does not go to the hospital."  Patient continues to deny suicidal ideations occurring today.  [name] reports suicidal ideation 2 days ago after he "had a flashback of my mom who passed 3 years ago."  He denies suicidal and homicidal ideations currently.  Patient contracts verbally for safety at this time.  He endorses history of multiple previous suicide attempts.  Most recent attempt 1 month ago when he attempted to overdose on medications.  Regarding total number of attempts patient states "I do  not know there is a lot."  He denies auditory and visual hallucinations.  There is no evidence of delusional thought content and no indication that patient is responding to internal stimuli. He denies symptoms of paranoia.  Patient history includes major depressive disorder, recurrent severe without psychotic features, and PTSD.  Previously diagnosed with traumatic brain injury.  Discharged from [facility] behavioral health on 11/07/2022.  Patient reports he has been compliant with follow-up at [facility], upcoming appointment in July.  He also reports compliance with medications.  He endorses history of multiple previous inpatient psychiatric hospitalizations. No family mental health or addiction history.  [name] resides alone in [place].  He denies access to weapons.  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  Currently no income however he is working through the [employer] to secure disability benefits related to mental health.  Patient reports he was homeless prior to the [facility] assisting with current apartment.  He endorses marijuana use less than 1 time per week.  He endorses alcohol use, less than 1 twelve ounce beer per week.  He endorses average sleep and appetite.  Patient offered support and encouragement.  Patient agrees with treatment plan to include admission to continuous observation unit with reassessment tomorrow.  Reviewed medications including current medications prior to admission, discussed potential side effects and offered patient opportunity to ask questions.  Patient confirms desire to remain full CODE STATUS.    Total Time spent with patient: 30 minutes  Musculoskeletal  Strength & Muscle Tone: within normal limits Gait & Station: normal Patient leans: N/A  Psychiatric Specialty Exam  Presentation General Appearance:  Appropriate for Environment; Casual  Eye Contact: Fair  Speech: Clear and Coherent; Other (comment) (repetitive  hiccup-like sound only when speaking)  Speech Volume: Normal  Handedness: Right   Mood and Affect  Mood: Anxious  Affect: Congruent   Thought Process  Thought Processes: Coherent; Goal Directed; Linear  Descriptions of Associations:Intact  Orientation:Full (Time, Place and Person)  Thought Content:Logical; WDL  Diagnosis of Schizophrenia or Schizoaffective disorder in past: No   Hallucinations:Hallucinations: None  Ideas of Reference:None  Suicidal Thoughts:Suicidal Thoughts: No (passive suicidal ideations 2 days ago)  Homicidal Thoughts:Homicidal Thoughts: No   Sensorium  Memory: Immediate Fair  Judgment: Intact  Insight: Shallow   Executive Functions  Concentration: Fair  Attention Span: Good  Recall: Good  Fund of Knowledge: Fair  Language: Fair   Psychomotor Activity  Psychomotor Activity: Psychomotor Activity: Increased   Assets  Assets: Communication Skills; Desire for Improvement; Financial Resources/Insurance; Housing; Physical Health; Resilience; Social Support   Sleep  Sleep: Sleep: Good   Nutritional Assessment (For OBS and FBC admissions only) Has the patient had a weight loss or gain of 10 pounds or more in the last 3 months?: No Has the patient had a decrease in food intake/or appetite?: No Does the patient have dental problems?: No Does the patient have eating habits or behaviors that may be indicators of an eating disorder including binging or inducing vomiting?: No Has the patient recently lost weight without trying?: 0 Has the patient been eating poorly because of a decreased appetite?: 0 Malnutrition Screening Tool Score: 0    Physical Exam Vitals and nursing note reviewed.  Constitutional:      Appearance: Normal appearance. He is well-developed and normal weight.  HENT:     Head: Normocephalic and atraumatic.     Nose: Nose normal.  Cardiovascular:     Rate and Rhythm: Normal rate.  Pulmonary:      Effort: Pulmonary effort is normal.  Musculoskeletal:        General: Normal range of motion.     Cervical back: Normal range of motion.  Skin:    General: Skin is warm and dry.  Neurological:     Mental Status: He is alert and oriented to person, place, and time.  Psychiatric:        Attention and Perception: Attention and perception normal.        Mood and Affect: Mood and affect normal.        Speech: Speech normal.        Behavior: Behavior normal. Behavior is cooperative.        Thought Content: Thought content normal.        Cognition and Memory: Memory normal.    Review of Systems  Constitutional: Negative.   HENT: Negative.    Eyes: Negative.   Respiratory: Negative.    Cardiovascular: Negative.   Gastrointestinal: Negative.   Genitourinary: Negative.   Musculoskeletal: Negative.   Skin: Negative.   Neurological: Negative.   Psychiatric/Behavioral: Negative.      Blood pressure 131/79, pulse 80, temperature 98.5 F (36.9 C), temperature source Oral, resp. rate 18, SpO2 98 %. There is no height or weight on file to calculate BMI.  Past Psychiatric History: Major depressive disorder, recurrent, severe without psychotic features, PTSD, substance-induced mood disorder  Is the patient at risk to self? No  Has the patient been a risk to self in the past 6 months? Yes .    Has the patient been a risk to self within the distant past? Yes  Is the patient a risk to others? No   Has the patient been a risk to others in the past 6 months? No   Has the patient been a risk to others within the distant past? No   Past Medical History: Traumatic brain injury, hepatitis C, seizure disorder  Family History: None reported  Social History: Resides alone in [name], applying for disability, reports infrequent alcohol and marijuana use  Last Labs:  Admission on 10/28/2022, Discharged on 11/07/2022  Component Date Value Ref Range Status   Hgb A1c MFr Bld 10/29/2022 6.1 (H)   4.8 - 5.6 % Final   Comment: (NOTE)         Prediabetes: 5.7 - 6.4         Diabetes: >6.4         Glycemic control for adults with diabetes: <7.0    Mean Plasma Glucose 10/29/2022 128  mg/dL Final   Comment: (NOTE) Performed At: [facility] [address] [place], [place] [id number] Jolene Schimke MD WU:[id number]    Cholesterol 10/29/2022 210 (H)  0 - 200 mg/dL Final   Triglycerides 95/62/1308 337 (H)  <150 mg/dL Final   HDL 65/78/4696 50  >40 mg/dL Final   Total CHOL/HDL Ratio 10/29/2022 4.2  RATIO Final   VLDL 10/29/2022 67 (H)  0 - 40 mg/dL Final   LDL Cholesterol 10/29/2022 93  0 - 99 mg/dL Final   Comment:        Total Cholesterol/HDL:CHD Risk Coronary Heart Disease Risk Table                     Men   Women  1/2 Average Risk   3.4   3.3  Average Risk       5.0   4.4  2 X Average Risk   9.6   7.1  3 X Average Risk  23.4   11.0        Use the calculated Patient Ratio above and the CHD Risk Table to determine the patient's CHD Risk.        ATP III CLASSIFICATION (LDL):  <100     mg/dL   Optimal  295-284  mg/dL   Near or Above                    Optimal  130-159  mg/dL   Borderline  132-440  mg/dL   High  >102     mg/dL   Very High Performed at [facility], [address] [place], [place] [address]   Admission on 10/27/2022, Discharged on 10/28/2022  Component Date Value Ref Range Status   Sodium 10/27/2022 136  135 - 145 mmol/L Final   Potassium 10/27/2022 3.7  3.5 - 5.1 mmol/L Final   Chloride 10/27/2022 106  98 - 111 mmol/L Final   CO2 10/27/2022 19 (L)  22 - 32 mmol/L Final   Glucose, Bld 10/27/2022 98  70 - 99 mg/dL Final   Glucose reference range applies only to samples taken after fasting for at least 8 hours.   BUN 10/27/2022 10  6 - 20 mg/dL Final   Creatinine, Ser 10/27/2022 0.70  0.61 - 1.24 mg/dL Final   Calcium 64/40/3474 9.1  8.9 - 10.3 mg/dL Final   Total Protein 25/95/6387 8.2 (H)  6.5 - 8.1 g/dL Final   Albumin  56/43/3295 4.7  3.5 - 5.0 g/dL Final   AST 18/84/1660 29  15 - 41 U/L Final  ALT 10/27/2022 27  0 - 44 U/L Final   Alkaline Phosphatase 10/27/2022 92  38 - 126 U/L Final   Total Bilirubin 10/27/2022 1.1  0.3 - 1.2 mg/dL Final   GFR, Estimated 10/27/2022 >60  >60 mL/min Final   Comment: (NOTE) Calculated using the CKD-EPI Creatinine Equation (2021)    Anion gap 10/27/2022 11  5 - 15 Final   Performed at [facility], [address], [place], [place] [address]   Alcohol, Ethyl (B) 10/27/2022 <10  <10 mg/dL Final   Comment: (NOTE) Lowest detectable limit for serum alcohol is 10 mg/dL.  For medical purposes only. Performed at [facility], [address], [place], [place] [address]    Salicylate Lvl 10/27/2022 <7.0 (L)  7.0 - 30.0 mg/dL Final   Performed at [facility], [address], [place], [place] [address]   Acetaminophen (Tylenol), Serum 10/27/2022 <10 (L)  10 - 30 ug/mL Final   Comment: (NOTE) Therapeutic concentrations vary significantly. A range of 10-30 ug/mL  may be an effective concentration for many patients. However, some  are best treated at concentrations outside of this range. Acetaminophen concentrations >150 ug/mL at 4 hours after ingestion  and >50 ug/mL at 12 hours after ingestion are often associated with  toxic reactions.  Performed at [facility], [address], [place], [place] [address]    WBC 10/27/2022 6.6  4.0 - 10.5 K/uL Final   RBC 10/27/2022 5.12  4.22 - 5.81 MIL/uL Final   Hemoglobin 10/27/2022 16.0  13.0 - 17.0 g/dL Final   HCT 65/78/4696 45.8  39.0 - 52.0 % Final   MCV 10/27/2022 89.5  80.0 - 100.0 fL Final   MCH 10/27/2022 31.3  26.0 - 34.0 pg Final   MCHC 10/27/2022 34.9  30.0 - 36.0 g/dL Final   RDW 29/52/8413 13.2  11.5 - 15.5 % Final   Platelets 10/27/2022 259  150 - 400 K/uL Final   nRBC 10/27/2022 0.0  0.0 - 0.2 % Final   Performed at [facility], [address], [place], [place] [address]   Opiates 10/27/2022 NONE DETECTED  NONE DETECTED Final   Cocaine 10/27/2022 NONE DETECTED  NONE DETECTED Final   Benzodiazepines 10/27/2022 NONE DETECTED  NONE DETECTED Final   Amphetamines 10/27/2022 NONE DETECTED  NONE DETECTED Final   Tetrahydrocannabinol 10/27/2022 POSITIVE (A)  NONE DETECTED Final   Barbiturates 10/27/2022 NONE DETECTED  NONE DETECTED Final   Comment: (NOTE) DRUG SCREEN FOR MEDICAL PURPOSES ONLY.  IF CONFIRMATION IS NEEDED FOR ANY PURPOSE, NOTIFY LAB WITHIN 5 DAYS.  LOWEST DETECTABLE LIMITS FOR URINE DRUG SCREEN Drug Class                     Cutoff (ng/mL) Amphetamine and metabolites    1000 Barbiturate and metabolites    200 Benzodiazepine                 200 Opiates and metabolites        300 Cocaine and metabolites        300 THC                            50 Performed at [facility], [address] [place], [place] [address]    Color, Urine 10/27/2022 STRAW (A)  YELLOW Final   APPearance 10/27/2022 CLEAR  CLEAR Final   Specific Gravity,  Urine 10/27/2022 1.005  1.005 - 1.030 Final   pH 10/27/2022 5.0  5.0 - 8.0 Final   Glucose, UA 10/27/2022 NEGATIVE  NEGATIVE mg/dL Final   Hgb urine dipstick 10/27/2022 SMALL (A)  NEGATIVE Final   Bilirubin Urine 10/27/2022 NEGATIVE  NEGATIVE Final   Ketones, ur 10/27/2022 5 (A)  NEGATIVE mg/dL Final   Protein, ur 40/98/1191 NEGATIVE  NEGATIVE mg/dL Final   Nitrite 47/82/9562 NEGATIVE  NEGATIVE Final   Leukocytes,Ua 10/27/2022 NEGATIVE  NEGATIVE Final   RBC / HPF 10/27/2022 0-5  0 - 5 RBC/hpf Final   WBC, UA 10/27/2022 0-5  0 - 5 WBC/hpf Final   Bacteria, UA 10/27/2022 NONE SEEN  NONE SEEN Final   Squamous Epithelial / HPF 10/27/2022 0-5  0 - 5 /HPF Final   Mucus 10/27/2022 PRESENT   Final   Performed at [facility], [address], [place], [place] [address]  Admission on 10/08/2022, Discharged on 10/16/2022  Component  Date Value Ref Range Status   Levetiracetam Lvl 10/09/2022 16.9  10.0 - 40.0 ug/mL Final   Comment: (NOTE) Performed At: [facility] [address] [place], [place] [id number] Jolene Schimke MD BM:[id number]    Acetaminophen (Tylenol), Serum 10/09/2022 <10 (L)  10 - 30 ug/mL Final   Comment: (NOTE) Therapeutic concentrations vary significantly. A range of 10-30 ug/mL  may be an effective concentration for many patients. However, some  are best treated at concentrations outside of this range. Acetaminophen concentrations >150 ug/mL at 4 hours after ingestion  and >50 ug/mL at 12 hours after ingestion are often associated with  toxic reactions.  Performed at [facility], [address] [place], [place] [address]    TSH 10/12/2022 1.428  0.350 - 4.500 uIU/mL Final   Comment: Performed by a 3rd Generation assay with a functional sensitivity of <=0.01 uIU/mL. Performed at [facility], [address] [place], [place] [address]   Admission on 10/07/2022, Discharged on 10/08/2022  Component Date Value Ref Range Status   Sodium 10/07/2022 137  135 - 145 mmol/L Final   Potassium 10/07/2022 4.1  3.5 - 5.1 mmol/L Final   Chloride 10/07/2022 108  98 - 111 mmol/L Final   CO2 10/07/2022 19 (L)  22 - 32 mmol/L Final   Glucose, Bld 10/07/2022 101 (H)  70 - 99 mg/dL Final   Glucose reference range applies only to samples taken after fasting for at least 8 hours.   BUN 10/07/2022 9  6 - 20 mg/dL Final   Creatinine, Ser 10/07/2022 0.70  0.61 - 1.24 mg/dL Final   Calcium 34/74/2595 8.8 (L)  8.9 - 10.3 mg/dL Final   Total Protein 63/87/5643 7.1  6.5 - 8.1 g/dL Final   Albumin 32/95/1884 4.1  3.5 - 5.0 g/dL Final   AST 16/60/6301 34  15 - 41 U/L Final   ALT 10/07/2022 30  0 - 44 U/L Final   Alkaline Phosphatase 10/07/2022 88  38 - 126 U/L Final   Total Bilirubin 10/07/2022 0.6  0.3 - 1.2 mg/dL Final   GFR, Estimated 10/07/2022 >60  >60 mL/min Final   Comment:  (NOTE) Calculated using the CKD-EPI Creatinine Equation (2021)    Anion gap 10/07/2022 10  5 - 15 Final   Performed at [facility], [address] [place], [place] [address]   Alcohol, Ethyl (B) 10/07/2022 <10  <10 mg/dL Final   Comment: (NOTE) Lowest detectable limit for serum alcohol is 10 mg/dL.  For  medical purposes only. Performed at [facility], [address] [place], [place] [address]    Opiates 10/07/2022 NONE DETECTED  NONE DETECTED Final   Cocaine 10/07/2022 NONE DETECTED  NONE DETECTED Final   Benzodiazepines 10/07/2022 NONE DETECTED  NONE DETECTED Final   Amphetamines 10/07/2022 NONE DETECTED  NONE DETECTED Final   Tetrahydrocannabinol 10/07/2022 POSITIVE (A)  NONE DETECTED Final   Barbiturates 10/07/2022 NONE DETECTED  NONE DETECTED Final   Comment: (NOTE) DRUG SCREEN FOR MEDICAL PURPOSES ONLY.  IF CONFIRMATION IS NEEDED FOR ANY PURPOSE, NOTIFY LAB WITHIN 5 DAYS.  LOWEST DETECTABLE LIMITS FOR URINE DRUG SCREEN Drug Class                     Cutoff (ng/mL) Amphetamine and metabolites    1000 Barbiturate and metabolites    200 Benzodiazepine                 200 Opiates and metabolites        300 Cocaine and metabolites        300 THC                            50 Performed at [facility], [address], [place], [place] [address]    WBC 10/07/2022 7.9  4.0 - 10.5 K/uL Final   RBC 10/07/2022 5.02  4.22 - 5.81 MIL/uL Final   Hemoglobin 10/07/2022 15.6  13.0 - 17.0 g/dL Final   HCT 09/81/1914 46.5  39.0 - 52.0 % Final   MCV 10/07/2022 92.6  80.0 - 100.0 fL Final   MCH 10/07/2022 31.1  26.0 - 34.0 pg Final   MCHC 10/07/2022 33.5  30.0 - 36.0 g/dL Final   RDW 78/29/5621 14.0  11.5 - 15.5 % Final   Platelets 10/07/2022 307  150 - 400 K/uL Final   nRBC 10/07/2022 0.0  0.0 - 0.2 % Final   Neutrophils Relative % 10/07/2022 71  % Final   Neutro Abs 10/07/2022 5.6  1.7 - 7.7 K/uL Final   Lymphocytes  Relative 10/07/2022 19  % Final   Lymphs Abs 10/07/2022 1.5  0.7 - 4.0 K/uL Final   Monocytes Relative 10/07/2022 8  % Final   Monocytes Absolute 10/07/2022 0.6  0.1 - 1.0 K/uL Final   Eosinophils Relative 10/07/2022 1  % Final   Eosinophils Absolute 10/07/2022 0.1  0.0 - 0.5 K/uL Final   Basophils Relative 10/07/2022 1  % Final   Basophils Absolute 10/07/2022 0.1  0.0 - 0.1 K/uL Final   Immature Granulocytes 10/07/2022 0  % Final   Abs Immature Granulocytes 10/07/2022 0.03  0.00 - 0.07 K/uL Final   Performed at [facility], [address], [place], [place] [address]   Levetiracetam Lvl 10/08/2022 <2.0 (L)  10.0 - 40.0 ug/mL Final   Comment: (NOTE) Performed At: [facility] [address] [place], [place] [id number] Jolene Schimke MD MW:[id number]   Admission on 09/27/2022, Discharged on 09/28/2022  Component Date Value Ref Range Status   Sodium 09/27/2022 136  135 - 145 mmol/L Final   Potassium 09/27/2022 3.4 (L)  3.5 - 5.1 mmol/L Final   Chloride 09/27/2022 105  98 - 111 mmol/L Final   CO2 09/27/2022 18 (L)  22 - 32 mmol/L Final   Glucose, Bld 09/27/2022 106 (H)  70 - 99 mg/dL Final   Glucose reference range applies only to samples taken after  fasting for at least 8 hours.   BUN 09/27/2022 10  6 - 20 mg/dL Final   Creatinine, Ser 09/27/2022 0.78  0.61 - 1.24 mg/dL Final   Calcium 40/98/1191 8.7 (L)  8.9 - 10.3 mg/dL Final   Total Protein 47/82/9562 6.9  6.5 - 8.1 g/dL Final   Albumin 13/12/6576 3.8  3.5 - 5.0 g/dL Final   AST 46/96/2952 23  15 - 41 U/L Final   ALT 09/27/2022 32  0 - 44 U/L Final   Alkaline Phosphatase 09/27/2022 96  38 - 126 U/L Final   Total Bilirubin 09/27/2022 0.3  0.3 - 1.2 mg/dL Final   GFR, Estimated 09/27/2022 >60  >60 mL/min Final   Comment: (NOTE) Calculated using the CKD-EPI Creatinine Equation (2021)    Anion gap 09/27/2022 13  5 - 15 Final   Performed at [facility], [address], [place], [place] [address]    Alcohol, Ethyl (B) 09/27/2022 47 (H)  <10 mg/dL Final   Comment: (NOTE) Lowest detectable limit for serum alcohol is 10 mg/dL.  For medical purposes only. Performed at [facility], [address], [place], [place] [address]    Opiates 09/27/2022 NONE DETECTED  NONE DETECTED Final   Cocaine 09/27/2022 NONE DETECTED  NONE DETECTED Final   Benzodiazepines 09/27/2022 NONE DETECTED  NONE DETECTED Final   Amphetamines 09/27/2022 NONE DETECTED  NONE DETECTED Final   Tetrahydrocannabinol 09/27/2022 POSITIVE (A)  NONE DETECTED Final   Barbiturates 09/27/2022 NONE DETECTED  NONE DETECTED Final   Comment: (NOTE) DRUG SCREEN FOR MEDICAL PURPOSES ONLY.  IF CONFIRMATION IS NEEDED FOR ANY PURPOSE, NOTIFY LAB WITHIN 5 DAYS.  LOWEST DETECTABLE LIMITS FOR URINE DRUG SCREEN Drug Class                     Cutoff (ng/mL) Amphetamine and metabolites    1000 Barbiturate and metabolites    200 Benzodiazepine                 200 Opiates and metabolites        300 Cocaine and metabolites        300 THC                            50 Performed at [facility], [address] [place], [place] [address]    WBC 09/27/2022 9.2  4.0 - 10.5 K/uL Final   RBC 09/27/2022 4.83  4.22 - 5.81 MIL/uL Final   Hemoglobin 09/27/2022 14.9  13.0 - 17.0 g/dL Final   HCT 66/44/0347 44.8  39.0 - 52.0 % Final   MCV 09/27/2022 92.8  80.0 - 100.0 fL Final   MCH 09/27/2022 30.8  26.0 - 34.0 pg Final   MCHC 09/27/2022 33.3  30.0 - 36.0 g/dL Final   RDW 42/59/5638 13.8  11.5 - 15.5 % Final   Platelets 09/27/2022 298  150 - 400 K/uL Final   nRBC 09/27/2022 0.0  0.0 - 0.2 % Final   Neutrophils Relative % 09/27/2022 72  % Final   Neutro Abs 09/27/2022 6.6  1.7 - 7.7 K/uL Final   Lymphocytes Relative 09/27/2022 20  % Final   Lymphs Abs 09/27/2022 1.9  0.7 - 4.0 K/uL Final   Monocytes Relative 09/27/2022 7  % Final   Monocytes Absolute 09/27/2022 0.7  0.1 - 1.0 K/uL Final   Eosinophils Relative 09/27/2022 1  %  Final  Eosinophils Absolute 09/27/2022 0.1  0.0 - 0.5 K/uL Final   Basophils Relative 09/27/2022 0  % Final   Basophils Absolute 09/27/2022 0.0  0.0 - 0.1 K/uL Final   Immature Granulocytes 09/27/2022 0  % Final   Abs Immature Granulocytes 09/27/2022 0.04  0.00 - 0.07 K/uL Final   Performed at [facility], [address], [place], [place] [address]   Salicylate Lvl 09/27/2022 <7.0 (L)  7.0 - 30.0 mg/dL Final   Performed at [facility], [address] [place], [place] [address]   Acetaminophen (Tylenol), Serum 09/27/2022 <10 (L)  10 - 30 ug/mL Final   Comment: (NOTE) Therapeutic concentrations vary significantly. A range of 10-30 ug/mL  may be an effective concentration for many patients. However, some  are best treated at concentrations outside of this range. Acetaminophen concentrations >150 ug/mL at 4 hours after ingestion  and >50 ug/mL at 12 hours after ingestion are often associated with  toxic reactions.  Performed at [facility], [address], [place], [place] [address]    Troponin I (High Sensitivity) 09/27/2022 3  <18 ng/L Final   Comment: (NOTE) Elevated high sensitivity troponin I (hsTnI) values and significant  changes across serial measurements may suggest ACS but many other  chronic and acute conditions are known to elevate hsTnI results.  Refer to the "Links" section for chest pain algorithms and additional  guidance. Performed at [facility], [address], [place], [place] [address]    SARS Coronavirus 2 by RT PCR 09/27/2022 POSITIVE (A)  NEGATIVE Final   Performed at [facility], [address] [place], [place] [address]   Levetiracetam Lvl 09/27/2022 <2.0 (L)  10.0 - 40.0 ug/mL Final   Comment: (NOTE) Performed At: [facility] [address] [place], [place] [id number] Jolene Schimke MD ON:[id number]    Acetaminophen (Tylenol), Serum 09/28/2022 <10 (L)  10 - 30 ug/mL Final   Comment: (NOTE) Therapeutic  concentrations vary significantly. A range of 10-30 ug/mL  may be an effective concentration for many patients. However, some  are best treated at concentrations outside of this range. Acetaminophen concentrations >150 ug/mL at 4 hours after ingestion  and >50 ug/mL at 12 hours after ingestion are often associated with  toxic reactions.  Performed at [facility], [address], [place], [place] [address]   Admission on 09/20/2022, Discharged on 09/22/2022  Component Date Value Ref Range Status   Sodium 09/20/2022 134 (L)  135 - 145 mmol/L Final   Potassium 09/20/2022 3.5  3.5 - 5.1 mmol/L Final   Chloride 09/20/2022 103  98 - 111 mmol/L Final   CO2 09/20/2022 19 (L)  22 - 32 mmol/L Final   Glucose, Bld 09/20/2022 89  70 - 99 mg/dL Final   Glucose reference range applies only to samples taken after fasting for at least 8 hours.   BUN 09/20/2022 8  6 - 20 mg/dL Final   Creatinine, Ser 09/20/2022 1.03  0.61 - 1.24 mg/dL Final   Calcium 27/25/3664 9.2  8.9 - 10.3 mg/dL Final   Total Protein 40/34/7425 7.8  6.5 - 8.1 g/dL Final   Albumin 95/63/8756 4.6  3.5 - 5.0 g/dL Final   AST 43/32/9518 31  15 - 41 U/L Final   ALT 09/20/2022 32  0 - 44 U/L Final   Alkaline Phosphatase 09/20/2022 124  38 - 126 U/L Final   Total Bilirubin 09/20/2022 1.7 (H)  0.3 -  1.2 mg/dL Final   GFR, Estimated 09/20/2022 >60  >60 mL/min Final   Comment: (NOTE) Calculated using the CKD-EPI Creatinine Equation (2021)    Anion gap 09/20/2022 12  5 - 15 Final   Performed at [facility], [address], [place], [place] [address]   Alcohol, Ethyl (B) 09/20/2022 <10  <10 mg/dL Final   Comment: (NOTE) Lowest detectable limit for serum alcohol is 10 mg/dL.  For medical purposes only. Performed at [facility], [address], [place], [place] [address]    Salicylate Lvl 09/20/2022 <7.0 (L)  7.0 - 30.0 mg/dL Final   Performed at [facility], [address] [place], [place] [address]    Acetaminophen (Tylenol), Serum 09/20/2022 <10 (L)  10 - 30 ug/mL Final   Comment: (NOTE) Therapeutic concentrations vary significantly. A range of 10-30 ug/mL  may be an effective concentration for many patients. However, some  are best treated at concentrations outside of this range. Acetaminophen concentrations >150 ug/mL at 4 hours after ingestion  and >50 ug/mL at 12 hours after ingestion are often associated with  toxic reactions.  Performed at [facility], [address], [place], [place] [address]    WBC 09/20/2022 7.9  4.0 - 10.5 K/uL Final   RBC 09/20/2022 5.21  4.22 - 5.81 MIL/uL Final   Hemoglobin 09/20/2022 16.3  13.0 - 17.0 g/dL Final   HCT 29/56/2130 46.0  39.0 - 52.0 % Final   MCV 09/20/2022 88.3  80.0 - 100.0 fL Final   MCH 09/20/2022 31.3  26.0 - 34.0 pg Final   MCHC 09/20/2022 35.4  30.0 - 36.0 g/dL Final   RDW 86/57/8469 14.1  11.5 - 15.5 % Final   Platelets 09/20/2022 266  150 - 400 K/uL Final   nRBC 09/20/2022 0.0  0.0 - 0.2 % Final   Performed at [facility], [address], [place], [place] [address]   Opiates 09/20/2022 NONE DETECTED  NONE DETECTED Final   Cocaine 09/20/2022 POSITIVE (A)  NONE DETECTED Final   Benzodiazepines 09/20/2022 NONE DETECTED  NONE DETECTED Final   Amphetamines 09/20/2022 POSITIVE (A)  NONE DETECTED Final   Tetrahydrocannabinol 09/20/2022 POSITIVE (A)  NONE DETECTED Final   Barbiturates 09/20/2022 NONE DETECTED  NONE DETECTED Final   Comment: (NOTE) DRUG SCREEN FOR MEDICAL PURPOSES ONLY.  IF CONFIRMATION IS NEEDED FOR ANY PURPOSE, NOTIFY LAB WITHIN 5 DAYS.  LOWEST DETECTABLE LIMITS FOR URINE DRUG SCREEN Drug Class                     Cutoff (ng/mL) Amphetamine and metabolites    1000 Barbiturate and metabolites    200 Benzodiazepine                 200 Opiates and metabolites        300 Cocaine and metabolites        300 THC                            50 Performed at [facility], [address]  [place], [place] [address]   Community Documentation on 08/13/2022  Component Date Value Ref Range Status   Glucose 08/13/2022 151   Final    Allergies: Other and Tylenol [acetaminophen]  Medications:  Facility Ordered Medications  Medication   alum & mag hydroxide-simeth (MAALOX/MYLANTA) 200-200-20 MG/5ML suspension 30 mL   magnesium  hydroxide (MILK OF MAGNESIA) suspension 30 mL   albuterol (VENTOLIN HFA) 108 (90 Base) MCG/ACT inhaler 2 puff   [START ON 11/21/2022] FLUoxetine (PROZAC) capsule 40 mg   hydrOXYzine (ATARAX) tablet 50 mg   levETIRAcetam (KEPPRA) tablet 500 mg   [START ON 11/21/2022] lisinopril (ZESTRIL) tablet 10 mg   prazosin (MINIPRESS) capsule 1 mg   risperiDONE (RISPERDAL) tablet 2 mg   traZODone (DESYREL) tablet 100 mg   PTA Medications  Medication Sig   lisinopril (ZESTRIL) 10 MG tablet Take 1 tablet (10 mg total) by mouth daily.   hydrOXYzine (ATARAX) 50 MG tablet Take 1 tablet (50 mg total) by mouth every 6 (six) hours as needed for anxiety.   albuterol (VENTOLIN HFA) 108 (90 Base) MCG/ACT inhaler Inhale 2 puffs into the lungs every 4 (four) hours as needed for wheezing or shortness of breath.   prazosin (MINIPRESS) 1 MG capsule Take 1 capsule (1 mg total) by mouth at bedtime.   FLUoxetine (PROZAC) 40 MG capsule Take 1 capsule (40 mg total) by mouth daily.   risperiDONE (RISPERDAL) 2 MG tablet Take 1 tablet (2 mg total) by mouth 2 (two) times daily.   traZODone (DESYREL) 100 MG tablet Take 1 tablet (100 mg total) by mouth at bedtime.   levETIRAcetam (KEPPRA) 500 MG tablet Take 1 tablet (500 mg total) by mouth every 12 (twelve) hours.      Medical Decision Making  Patient remains voluntary.  He will be admitted to continuous assessment unit at [facility] behavioral health for treatment and stabilization.  He will be reassessed on 11/21/2022, disposition will be determined at that time.  Laboratory studies ordered including CBC, CMP, ethanol, magnesium,  prolactin and TSH.  Urine drug screen order initiated.  EKG ordered.  Current medications: -Acetaminophen 650 mg every 6 as needed/mild pain -Maalox 30 mL oral every 4 as needed/digestion -Magnesium hydroxide 30 mL daily as needed/mild constipation -NicoDerm 14 mg transdermal patch daily/nicotine withdrawal   Prior to admission medications initiated: -Albuterol Ventolin HFA 108 mcg/ACT 2 puff inhalation every 4 hours as needed/wheezing or shortness of breath -Fluoxetine 40 mg daily -Hydroxyzine 50 mg every 6 as needed/anxiety -Levetiracetam 500 mg every 12 hours -Lisinopril 10 mg daily -Prazosin 1 mg nightly -Risperidone 2 mg twice daily -Trazodone 100 mg nightly    Recommendations  Based on my evaluation the patient does not appear to have an emergency medical condition.  Lenard Lance, FNP 11/20/22  7:03 PM

## 2022-11-21 DIAGNOSIS — F332 Major depressive disorder, recurrent severe without psychotic features: Secondary | ICD-10-CM | POA: Diagnosis not present

## 2022-11-21 MED ORDER — NICOTINE POLACRILEX 4 MG MT GUM: 4.0000 mg | CHEWING_GUM | Freq: Four times a day (QID) | OROMUCOSAL | 0 refills | Status: DC | PRN

## 2022-11-21 NOTE — ED Notes (Signed)
Patient A&O x 4, ambulatory. Patient discharged in no acute distress. Patient denied SI/HI, A/VH upon discharge. Patient verbalized understanding of all discharge instructions explained by staff, to include follow up appointments, RX's and safety plan. Patient reported mood 10/10.  Pt belongings returned to patient from locker # 13 intact. Patient escorted to lobby via staff for transport to destination. Safety maintained.  

## 2022-11-21 NOTE — ED Notes (Signed)
Patient A&Ox4. Patient denies SI/Hi and AVH. Denies A/VH. Patient denies any physical complaints when asked. No acute distress noted. Support and encouragement provided. Routine safety checks conducted according to facility protocol. Encouraged patient to notify staff if thoughts of harm toward self or others arise. Patient verbalize understanding and agreement. Will continue to monitor for safety.    

## 2022-11-21 NOTE — Discharge Instructions (Signed)

## 2022-11-21 NOTE — ED Notes (Signed)
Pt is currently sleeping, no distress noted, environmental check complete, will continue to monitor patient for safety.  

## 2022-11-21 NOTE — ED Provider Notes (Signed)
FBC/OBS ASAP Discharge Summary  Date and Time: 11/21/2022 10:25 AM  Name: Raymond [name]  MRN:  [id number]   Discharge Diagnoses:  Final diagnoses:  Major depressive disorder, recurrent, severe without psychotic behavior (HCC)    Subjective: Patient states "I am doing better today and I am ready to go home."  Patient is reassessed by this nurse practitioner face-to-face.  He is reclined in observation area upon my approach, no apparent distress.  He is alert and oriented, pleasant and cooperative during assessment.  Chart reviewed and patient discussed with Dr. Nelly Rout on 11/21/2022.  Patient presents with euthymic mood, congruent affect.  Raymond [name] continues to deny suicidal and homicidal ideations.  He reports readiness to discharge home.  Patient states "I believe yesterday was a misunderstanding."  He denies auditory and visual hallucinations.  There is no evidence of delusional thought content no indication that patient is responding to internal stimuli.  Patient's diagnoses include major depressive disorder PTSD substance-induced mood disorder and cocaine abuse.  He is followed by outpatient psychiatry at [facility].  He endorses multiple previous inpatient psychiatric hospitalizations.  Patient resides alone in [place].  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    Patient offered support and encouragement. He would like to stop using tobacco/nicotine and requests nicorette gum.  Declines any person to contact for collateral information at this time.   Patient educated and verbalizes understanding of mental health resources and other crisis services in the community. They are instructed to call 911 and present to the nearest emergency room should patient experience any suicidal/homicidal ideation, auditory/visual/hallucinations, or detrimental worsening of mental health condition.     Stay Summary: 11/21/2022 - 1032am HPI: Raymond [name] is a 42 year old male.  Patient presents voluntarily to [facility] health, transported by Patent examiner.  Patient is assessed by this nurse practitioner face-to-face.  He is seated in assessment area, no apparent distress.  He is alert and oriented, pleasant and cooperative during assessment.  Patient presents with euthymic mood, congruent affect.  Behavior childlike.  Patient exhibits repetitive hiccup-like sounds only when speaking.  Chart reviewed and patient discussed with Dr. Nelly Rout on 11/20/2022. Per medical record this is potentially his baseline.   Patient states "this is all a misunderstanding to be honest and I am feeling just fine."  Patient reports he received a phone call from a representative of the veterans administration earlier today.  Patient states "it is a misunderstanding, the lady went through my record and I guess she felt I was doing bad, she feared for my health."  Per medical record, patient endorsed suicidal ideation earlier this date.  Patient verbalized to representative of veterans administration, on telephone, earlier this date that he will "likely attempt to kill himself if he does not go to the hospital."  Patient continues to deny suicidal ideations occurring today.   Raymond [name] reports suicidal ideation 2 days ago after he "had a flashback of my mom who passed 3 years ago."  He denies suicidal and homicidal ideations currently.  Patient contracts verbally for safety at this time.  He endorses history of multiple previous suicide attempts.  Most recent attempt 1 month ago when he attempted to overdose on medications.  Regarding total number of attempts patient states "I do not know there is a lot."  He denies auditory and visual hallucinations.  There is no evidence of delusional thought content and no indication that patient is responding to internal stimuli.  He denies symptoms of paranoia.   Patient history includes major depressive disorder,  recurrent severe without psychotic features, and PTSD.  Previously diagnosed with traumatic brain injury.  Discharged from [facility] behavioral health on 11/07/2022.  Patient reports he has been compliant with follow-up at [facility], upcoming appointment in July.  He also reports compliance with medications.  He endorses history of multiple previous inpatient psychiatric hospitalizations. No family mental health or addiction history.   Raymond [name] resides alone in [place].  He denies access to weapons.  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  Currently no income however he is working through the [employer] to secure disability benefits related to mental health.  Patient reports he was homeless prior to the [facility] assisting with current apartment.  He endorses marijuana use less than 1 time per week.  He endorses alcohol use, less than 1 twelve ounce beer per week.  He endorses average sleep and appetite.   Patient offered support and encouragement.  Patient agrees with treatment plan to include admission to continuous observation unit with reassessment tomorrow.  Reviewed medications including current medications prior to admission, discussed potential side effects and offered patient opportunity to ask questions.  Patient confirms desire to remain full CODE STATUS.  Total Time spent with patient: 30 minutes  Past Psychiatric History: Major depressive disorder, recurrent, severe without psychotic features, PTSD, substance-induced mood disorder Past Medical History: Traumatic brain injury, hepatitis C, seizure disorder Family History: none reported Family Psychiatric History: none reported Social History: volunteers daily, supportive community, denies chronic substance use Tobacco Cessation:  A prescription for an FDA-approved tobacco cessation medication provided at discharge  Current Medications:  Current Facility-Administered Medications  Medication Dose Route  Frequency Provider Last Rate Last Admin   albuterol (VENTOLIN HFA) 108 (90 Base) MCG/ACT inhaler 2 puff  2 puff Inhalation Q4H PRN Lenard Lance, FNP       alum & mag hydroxide-simeth (MAALOX/MYLANTA) 200-200-20 MG/5ML suspension 30 mL  30 mL Oral Q4H PRN Lenard Lance, FNP       FLUoxetine (PROZAC) capsule 40 mg  40 mg Oral Daily Lenard Lance, FNP   40 mg at 11/21/22 0953   hydrOXYzine (ATARAX) tablet 50 mg  50 mg Oral Q6H PRN Lenard Lance, FNP       levETIRAcetam (KEPPRA) tablet 500 mg  500 mg Oral Q12H Lenard Lance, FNP   500 mg at 11/21/22 0954   lisinopril (ZESTRIL) tablet 10 mg  10 mg Oral Daily Lenard Lance, FNP   10 mg at 11/21/22 0954   magnesium hydroxide (MILK OF MAGNESIA) suspension 30 mL  30 mL Oral Daily PRN Lenard Lance, FNP       nicotine polacrilex (NICORETTE) gum 2 mg  2 mg Oral Daily PRN Onuoha, Chinwendu V, NP       prazosin (MINIPRESS) capsule 1 mg  1 mg Oral QHS Lenard Lance, FNP   1 mg at 11/20/22 2139   risperiDONE (RISPERDAL) tablet 2 mg  2 mg Oral BID Lenard Lance, FNP   2 mg at 11/21/22 9604   traZODone (DESYREL) tablet 100 mg  100 mg Oral QHS Lenard Lance, FNP   100 mg at 11/20/22 2139   Current Outpatient Medications  Medication Sig Dispense Refill   albuterol (VENTOLIN HFA) 108 (90 Base) MCG/ACT inhaler Inhale 2 puffs into the lungs every 4 (four) hours as needed for wheezing or shortness of breath.  6.7 g 0   FLUoxetine (PROZAC) 40 MG capsule Take 1 capsule (40 mg total) by mouth daily. 30 capsule 0   hydrOXYzine (ATARAX) 50 MG tablet Take 1 tablet (50 mg total) by mouth every 6 (six) hours as needed for anxiety. 10 tablet 0   levETIRAcetam (KEPPRA) 500 MG tablet Take 1 tablet (500 mg total) by mouth every 12 (twelve) hours. 60 tablet 0   lisinopril (ZESTRIL) 10 MG tablet Take 1 tablet (10 mg total) by mouth daily. 10 tablet 0   prazosin (MINIPRESS) 1 MG capsule Take 1 capsule (1 mg total) by mouth at bedtime. 30 capsule 0   risperiDONE (RISPERDAL) 2 MG  tablet Take 1 tablet (2 mg total) by mouth 2 (two) times daily. 60 tablet 0   traZODone (DESYREL) 100 MG tablet Take 1 tablet (100 mg total) by mouth at bedtime. 30 tablet 0    PTA Medications:  Facility Ordered Medications  Medication   alum & mag hydroxide-simeth (MAALOX/MYLANTA) 200-200-20 MG/5ML suspension 30 mL   magnesium hydroxide (MILK OF MAGNESIA) suspension 30 mL   albuterol (VENTOLIN HFA) 108 (90 Base) MCG/ACT inhaler 2 puff   FLUoxetine (PROZAC) capsule 40 mg   hydrOXYzine (ATARAX) tablet 50 mg   levETIRAcetam (KEPPRA) tablet 500 mg   lisinopril (ZESTRIL) tablet 10 mg   prazosin (MINIPRESS) capsule 1 mg   risperiDONE (RISPERDAL) tablet 2 mg   traZODone (DESYREL) tablet 100 mg   nicotine polacrilex (NICORETTE) gum 2 mg   PTA Medications  Medication Sig   lisinopril (ZESTRIL) 10 MG tablet Take 1 tablet (10 mg total) by mouth daily.   hydrOXYzine (ATARAX) 50 MG tablet Take 1 tablet (50 mg total) by mouth every 6 (six) hours as needed for anxiety.   albuterol (VENTOLIN HFA) 108 (90 Base) MCG/ACT inhaler Inhale 2 puffs into the lungs every 4 (four) hours as needed for wheezing or shortness of breath.   prazosin (MINIPRESS) 1 MG capsule Take 1 capsule (1 mg total) by mouth at bedtime.   FLUoxetine (PROZAC) 40 MG capsule Take 1 capsule (40 mg total) by mouth daily.   risperiDONE (RISPERDAL) 2 MG tablet Take 1 tablet (2 mg total) by mouth 2 (two) times daily.   traZODone (DESYREL) 100 MG tablet Take 1 tablet (100 mg total) by mouth at bedtime.   levETIRAcetam (KEPPRA) 500 MG tablet Take 1 tablet (500 mg total) by mouth every 12 (twelve) hours.        No data to display          Flowsheet Row ED from 11/20/2022 in [facility]Dis[facility]) [facility] from 10/27/2022 in [facility]  C-SSRS RISK CATEGORY Low Risk High Risk High Risk        Musculoskeletal  Strength & Muscle Tone: within normal limits Gait & Station: normal Patient leans: N/A  Psychiatric Specialty Exam  Presentation  General Appearance:  Appropriate for Environment; Casual  Eye Contact: Good  Speech: Clear and Coherent; Normal Rate  Speech Volume: Normal  Handedness: Right   Mood and Affect  Mood: Euthymic  Affect: Appropriate; Congruent   Thought Process  Thought Processes: Goal Directed; Coherent; Linear  Descriptions of Associations:Intact  Orientation:Full (Time, Place and Person)  Thought Content:Logical; WDL  Diagnosis of Schizophrenia or Schizoaffective disorder in past: No    Hallucinations:Hallucinations: None  Ideas of Reference:None  Suicidal Thoughts:Suicidal Thoughts: No  Homicidal Thoughts:Homicidal Thoughts:  No   Sensorium  Memory: Immediate Fair; Recent Fair  Judgment: Fair  Insight: Shallow   Executive Functions  Concentration: Good  Attention Span: Good  Recall: Good  Fund of Knowledge: Fair  Language: Fair   Psychomotor Activity  Psychomotor Activity: Psychomotor Activity: Normal   Assets  Assets: Communication Skills; Desire for Improvement; Financial Resources/Insurance; Housing; Leisure Time; Physical Health; Resilience; Social Support   Sleep  Sleep: Sleep: Good   Nutritional Assessment (For OBS and FBC admissions only) Has the patient had a weight loss or gain of 10 pounds or more in the last 3 months?: No Has the patient had a decrease in food intake/or appetite?: No Does the patient have dental problems?: No Does the patient have eating habits or behaviors that may be indicators of an eating disorder including binging or inducing vomiting?: No Has the patient recently lost weight without trying?: 0 Has the patient been eating poorly because of a decreased appetite?: 0 Malnutrition Screening Tool Score: 0    Physical Exam  Physical Exam Vitals and  nursing note reviewed.  Constitutional:      Appearance: Normal appearance. He is well-developed and normal weight.  HENT:     Head: Normocephalic and atraumatic.  Cardiovascular:     Rate and Rhythm: Normal rate.  Pulmonary:     Effort: Pulmonary effort is normal.  Skin:    General: Skin is warm and dry.  Neurological:     Mental Status: He is alert and oriented to person, place, and time.  Psychiatric:        Attention and Perception: Attention and perception normal.        Mood and Affect: Mood and affect normal.        Speech: Speech normal.        Behavior: Behavior normal. Behavior is cooperative.        Thought Content: Thought content normal.        Cognition and Memory: Memory normal.    Review of Systems  Constitutional: Negative.   HENT: Negative.    Eyes: Negative.   Respiratory: Negative.    Cardiovascular: Negative.   Gastrointestinal: Negative.   Genitourinary: Negative.   Musculoskeletal: Negative.   Skin: Negative.   Neurological: Negative.   Psychiatric/Behavioral: Negative.     Blood pressure 105/64, pulse 61, temperature 98.1 F (36.7 C), temperature source Oral, resp. rate 17, SpO2 98 %. There is no height or weight on file to calculate BMI.  Demographic Factors:  Male, Caucasian, and Living alone  Loss Factors: NA  Historical Factors: Prior suicide attempts  Risk Reduction Factors:   Employed, Positive social support, Positive therapeutic relationship, and Positive coping skills or problem solving skills  Continued Clinical Symptoms:  Previous Psychiatric Diagnoses and Treatments  Cognitive Features That Contribute To Risk:  None    Suicide Risk:  Minimal: No identifiable suicidal ideation.  Patients presenting with no risk factors but with morbid ruminations; may be classified as minimal risk based on the severity of the depressive symptoms  Plan Of Care/Follow-up recommendations:  Follow-up with established outpatient psychiatry at  [facility].   Medications: -Albuterol Ventolin HFA 108 mcg/ACT 2 puff inhalation every 4 hours as needed/wheezing or shortness of breath -Fluoxetine 40 mg daily -Hydroxyzine 50 mg every 6 as needed/anxiety -Levetiracetam 500 mg every 12 hours -Lisinopril 10 mg daily -Prazosin 1 mg nightly -Risperidone 2 mg twice daily -Trazodone 100 mg nightly  -nicorette gum 4mg  QID/ nicotine cessation  Disposition: Discharge  Inetta Fermo  Burna Mortimer, FNP 11/21/2022, 10:25 AM

## 2022-11-23 LAB — PROLACTIN: Prolactin: 19.5 ng/mL (ref 3.9–22.7)

## 2022-12-10 ENCOUNTER — Emergency Department (HOSPITAL_COMMUNITY)
Admission: EM | Admit: 2022-12-10 | Discharge: 2022-12-10 | Disposition: A | Discharge status: Home / Self Care | Payer: BLUE CROSS/BLUE SHIELD | Attending: Emergency Medicine | Admitting: Emergency Medicine

## 2022-12-10 ENCOUNTER — Emergency Department (HOSPITAL_COMMUNITY): Payer: BLUE CROSS/BLUE SHIELD

## 2022-12-10 ENCOUNTER — Other Ambulatory Visit: Payer: Self-pay

## 2022-12-10 DIAGNOSIS — S065XAA Traumatic subdural hemorrhage with loss of consciousness status unknown, initial encounter: Secondary | ICD-10-CM

## 2022-12-10 DIAGNOSIS — S01312A Laceration without foreign body of left ear, initial encounter: Secondary | ICD-10-CM | POA: Diagnosis not present

## 2022-12-10 DIAGNOSIS — Z91010 Allergy to peanuts: Secondary | ICD-10-CM | POA: Diagnosis not present

## 2022-12-10 DIAGNOSIS — I6200 Nontraumatic subdural hemorrhage, unspecified: Secondary | ICD-10-CM | POA: Insufficient documentation

## 2022-12-10 MED ORDER — TETANUS-DIPHTH-ACELL PERTUSSIS 5-2.5-18.5 LF-MCG/0.5 IM SUSY: 0.5000 mL | PREFILLED_SYRINGE | Freq: Once | INTRAMUSCULAR | Status: DC

## 2022-12-10 MED ORDER — LIDOCAINE HCL (PF) 1 % IJ SOLN
5.0000 mL | Freq: Once | INTRAMUSCULAR | Status: DC
  Filled 2022-12-10: qty 5
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
## 2022-12-10 NOTE — Discharge Instructions (Addendum)
Absorbable sutures: Keep sutures dry. Please refrain from swimming or bathing. Area can be gently cleaned with mild soap and water after 24-48 hours. Sutures will absorb and fall out within 7-10 days.  You do also have some bleeding in your brain that has been unchanged over 6 hours of monitoring. Your body should reabsorb this blood over time.  We have talked to our neurosurgeons and no follow-up is required given that there has been no change over time.  Take tylenol/ibuprofen as needed for pain.  Follow-up with your primary care doctor in a few days.  Return if development of any new or worsening symptoms
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
## 2022-12-10 NOTE — ED Notes (Signed)
Neuro surgery at bedside.
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
## 2022-12-10 NOTE — ED Notes (Signed)
Pt given clear liquids; okay per NP Doran Durand of Neurosurgery
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
## 2023-01-22 ENCOUNTER — Other Ambulatory Visit: Payer: Self-pay

## 2023-01-22 ENCOUNTER — Ambulatory Visit (HOSPITAL_COMMUNITY)
Admission: EM | Admit: 2023-01-22 | Discharge: 2023-01-23 | Disposition: A | Discharge status: Other Acute Inpatient Hospital | Payer: BLUE CROSS/BLUE SHIELD | Attending: Psychiatry | Admitting: Psychiatry

## 2023-01-22 DIAGNOSIS — J4521 Mild intermittent asthma with (acute) exacerbation: Secondary | ICD-10-CM | POA: Diagnosis not present

## 2023-01-22 DIAGNOSIS — F419 Anxiety disorder, unspecified: Secondary | ICD-10-CM | POA: Diagnosis not present

## 2023-01-22 DIAGNOSIS — F1410 Cocaine abuse, uncomplicated: Secondary | ICD-10-CM

## 2023-01-22 DIAGNOSIS — Z9151 Personal history of suicidal behavior: Secondary | ICD-10-CM | POA: Insufficient documentation

## 2023-01-22 DIAGNOSIS — F1490 Cocaine use, unspecified, uncomplicated: Secondary | ICD-10-CM | POA: Insufficient documentation

## 2023-01-22 DIAGNOSIS — F1290 Cannabis use, unspecified, uncomplicated: Secondary | ICD-10-CM | POA: Diagnosis not present

## 2023-01-22 DIAGNOSIS — I4510 Unspecified right bundle-branch block: Secondary | ICD-10-CM | POA: Diagnosis not present

## 2023-01-22 DIAGNOSIS — R066 Hiccough: Secondary | ICD-10-CM | POA: Insufficient documentation

## 2023-01-22 DIAGNOSIS — Z79899 Other long term (current) drug therapy: Secondary | ICD-10-CM | POA: Insufficient documentation

## 2023-01-22 DIAGNOSIS — Z602 Problems related to living alone: Secondary | ICD-10-CM | POA: Insufficient documentation

## 2023-01-22 DIAGNOSIS — R45851 Suicidal ideations: Secondary | ICD-10-CM | POA: Insufficient documentation

## 2023-01-22 DIAGNOSIS — F332 Major depressive disorder, recurrent severe without psychotic features: Secondary | ICD-10-CM | POA: Diagnosis present

## 2023-01-22 DIAGNOSIS — F4310 Post-traumatic stress disorder, unspecified: Secondary | ICD-10-CM | POA: Diagnosis not present

## 2023-01-22 DIAGNOSIS — Z5901 Sheltered homelessness: Secondary | ICD-10-CM | POA: Insufficient documentation

## 2023-01-22 LAB — COMPREHENSIVE METABOLIC PANEL
ALT: 31 U/L (ref 0–44)
AST: 30 U/L (ref 15–41)
Albumin: 4.6 g/dL (ref 3.5–5.0)
Alkaline Phosphatase: 101 U/L (ref 38–126)
Anion gap: 15 (ref 5–15)
BUN: 8 mg/dL (ref 6–20)
CO2: 20 mmol/L — ABNORMAL LOW (ref 22–32)
Calcium: 9.6 mg/dL (ref 8.9–10.3)
Chloride: 101 mmol/L (ref 98–111)
Creatinine, Ser: 0.72 mg/dL (ref 0.61–1.24)
GFR, Estimated: >60 mL/min (ref >60)
Glucose, Bld: 133 mg/dL — ABNORMAL HIGH (ref 70–99)
Potassium: 4 mmol/L (ref 3.5–5.1)
Sodium: 136 mmol/L (ref 135–145)
Total Bilirubin: 0.9 mg/dL (ref 0.3–1.2)
Total Protein: 7.8 g/dL (ref 6.5–8.1)

## 2023-01-22 LAB — POCT URINE DRUG SCREEN - MANUAL ENTRY (I-SCREEN)
POC Amphetamine UR: NOT DETECTED
POC Buprenorphine (BUP): NOT DETECTED
POC Cocaine UR: NOT DETECTED
POC Marijuana UR: POSITIVE — AB
POC Methadone UR: NOT DETECTED
POC Methamphetamine UR: NOT DETECTED
POC Morphine: NOT DETECTED
POC Oxazepam (BZO): NOT DETECTED
POC Oxycodone UR: NOT DETECTED
POC Secobarbital (BAR): NOT DETECTED

## 2023-01-22 LAB — CBC WITH DIFFERENTIAL/PLATELET
Abs Immature Granulocytes: 0.02 10*3/uL (ref 0.00–0.07)
Basophils Absolute: 0 10*3/uL (ref 0.0–0.1)
Basophils Relative: 1 %
Eosinophils Absolute: 0.1 10*3/uL (ref 0.0–0.5)
Eosinophils Relative: 2 %
HCT: 49.5 % (ref 39.0–52.0)
Hemoglobin: 16.9 g/dL (ref 13.0–17.0)
Immature Granulocytes: 0 %
Lymphocytes Relative: 26 %
Lymphs Abs: 1.6 10*3/uL (ref 0.7–4.0)
MCH: 30.3 pg (ref 26.0–34.0)
MCHC: 34.1 g/dL (ref 30.0–36.0)
MCV: 88.7 fL (ref 80.0–100.0)
Monocytes Absolute: 0.6 10*3/uL (ref 0.1–1.0)
Monocytes Relative: 10 %
Neutro Abs: 3.9 10*3/uL (ref 1.7–7.7)
Neutrophils Relative %: 61 %
Platelets: 294 10*3/uL (ref 150–400)
RBC: 5.58 MIL/uL (ref 4.22–5.81)
RDW: 13.3 % (ref 11.5–15.5)
WBC: 6.2 10*3/uL (ref 4.0–10.5)
nRBC: 0 % (ref 0.0–0.2)

## 2023-01-22 LAB — ETHANOL: Alcohol, Ethyl (B): <10 mg/dL (ref <10)

## 2023-01-22 LAB — MAGNESIUM: Magnesium: 2.3 mg/dL (ref 1.7–2.4)

## 2023-01-22 LAB — TSH: TSH: 1.361 u[IU]/mL (ref 0.350–4.500)

## 2023-01-22 MED ORDER — PRAZOSIN HCL 1 MG PO CAPS
1.0000 mg | ORAL_CAPSULE | Freq: Every day | ORAL | Status: DC
  Administered 2023-01-22: 1 mg via ORAL
  Filled 2023-01-22: qty 1

## 2023-01-22 MED ORDER — LISINOPRIL 10 MG PO TABS
10.0000 mg | ORAL_TABLET | Freq: Every day | ORAL | Status: DC
  Administered 2023-01-23: 10 mg via ORAL
  Filled 2023-01-22: qty 1

## 2023-01-22 MED ORDER — FLUOXETINE HCL 20 MG PO CAPS
40.0000 mg | ORAL_CAPSULE | Freq: Every day | ORAL | Status: DC
  Administered 2023-01-23: 40 mg via ORAL
  Filled 2023-01-22: qty 2

## 2023-01-22 MED ORDER — ALUM & MAG HYDROXIDE-SIMETH 200-200-20 MG/5ML PO SUSP: 30.0000 mL | ORAL | Status: DC | PRN

## 2023-01-22 MED ORDER — RISPERIDONE 1 MG PO TABS
1.0000 mg | ORAL_TABLET | Freq: Two times a day (BID) | ORAL | Status: DC
  Administered 2023-01-22 – 2023-01-23 (×2): 1 mg via ORAL
  Filled 2023-01-22 (×2): qty 1

## 2023-01-22 MED ORDER — MAGNESIUM HYDROXIDE 400 MG/5ML PO SUSP: 30.0000 mL | Freq: Every day | ORAL | Status: DC | PRN

## 2023-01-22 MED ORDER — TRAZODONE HCL 100 MG PO TABS
100.0000 mg | ORAL_TABLET | Freq: Every day | ORAL | Status: DC
  Administered 2023-01-22: 100 mg via ORAL
  Filled 2023-01-22: qty 1

## 2023-01-22 MED ORDER — TRAZODONE HCL 50 MG PO TABS: 50.0000 mg | ORAL_TABLET | Freq: Every evening | ORAL | Status: DC | PRN

## 2023-01-22 MED ORDER — NICOTINE 14 MG/24HR TD PT24
14.0000 mg | MEDICATED_PATCH | Freq: Every day | TRANSDERMAL | Status: DC
  Administered 2023-01-23: 14 mg via TRANSDERMAL
  Filled 2023-01-22: qty 1

## 2023-01-22 MED ORDER — ALBUTEROL SULFATE HFA 108 (90 BASE) MCG/ACT IN AERS
2.0000 | INHALATION_SPRAY | RESPIRATORY_TRACT | Status: DC | PRN
  Administered 2023-01-23: 2 via RESPIRATORY_TRACT
  Filled 2023-01-22: qty 6.7

## 2023-01-22 MED ORDER — HYDROXYZINE HCL 25 MG PO TABS: 25.0000 mg | ORAL_TABLET | Freq: Three times a day (TID) | ORAL | Status: DC | PRN

## 2023-01-22 MED ORDER — LEVETIRACETAM 500 MG PO TABS
500.0000 mg | ORAL_TABLET | Freq: Two times a day (BID) | ORAL | Status: DC
  Administered 2023-01-22 – 2023-01-23 (×2): 500 mg via ORAL
  Filled 2023-01-22 (×2): qty 1

## 2023-01-22 NOTE — ED Provider Notes (Addendum)
[facility] Urgent Care Continuous Assessment Admission H&P  Date: 01/22/23 Patient Name: Raymond [name] MRN: [id number] Chief Complaint: suicidal ideation   Diagnoses:  Final diagnoses:  Major depressive disorder, recurrent, severe without psychotic behavior (HCC)  Cocaine use disorder [facility])    HPI: Patient presents voluntarily to [facility] behavioral health for walk-in assessment.  Patient is transported by Patent examiner after calling 911.    Patient is assessed, face-to-face, by nurse practitioner. He is seated in assessment area, no acute distress.  He is alert and oriented, pleasant and cooperative during assessment.  Patient noted to make hiccup-like sounds repetitively when speaking.  Per chart review this is potentially his baseline.  Chart reviewed and patient discussed with Dr. Nelly Rout on 01/22/2023.  [name] reports worsening depression and anxious mood x 2 days.  Patient reports 2 days ago he used marijuana that he believes was laced with cocaine and methamphetamine.   Patient typically uses marijuana on average of 2-3 times per week.  He uses alcohol including an average of two 12 ounce beers and average of 4 times per week.  He does not typically use cocaine or methamphetamine.  Reports he was using marijuana with a friend.   Patient  presents with depressed mood, congruent affect.  He endorses suicidal ideation, worsening times several weeks, with plan to overdose on medications.  Patient reports 2 days ago he doubled his doses of daily medicines and attempt to end his life.  Patient endorses multiple previous suicide attempts.  He states "I think it is 15 or 20, I cannot count, it began in high school."  Patient is unable to contract verbally for safety at this time.  He endorses depressive symptoms including depressed mood and suicidal ideation.  Depressive symptoms worsening for several years.  [name] reports "I want to go to the hospital because the nurses there know me  and they take care of me.  The doctors know me too, and they know my name.  I am feeling anxious, like hurting myself and I want to go to the hospital because the hospital helps me.  I live in an apartment and my neighbors are never home.  There is nobody there to help me when I get panic attacks open I feel like hurting myself."  Patient has normal speech and behavior.  he  denies auditory and visual hallucinations.  Patient is able to converse coherently with goal-directed thoughts and no distractibility or preoccupation.  Denies symptoms of paranoia.  Objectively there is no evidence of psychosis/mania or delusional thinking.  Patient has been followed by [facility] in the past has not followed up with [facility] for several months.  Not currently followed by individual counseling provider.  He reports recently medications prescribed at inpatient psychiatric admissions. [facility].   Per chart review, patient admitted to [facility] behavioral health twice in May 2024 and once in April 2024.  Patient has remained compliant with trazodone and Prozac.  He has run out of prazosin and risperidone sometime ago.  He endorses history of multiple previous inpatient psychiatric hospitalizations.  2 years ago he relocated from [place] to [place].  No family mental health or addiction history reported.  Patient denies homicidal ideations.  He denies auditory and visual hallucinations currently.  He endorses history of "seeing shadows."  Most recent episode of seeing shadows approximately 2 months ago.  There is no evidence of delusional thought content and no indication that patient is  responding to internal stimuli at this time.  [name] resides alone in [place].  He denies access to weapons.  He is currently applying to receive disability benefits.  Finances are a concern.  He has a voucher for his housing and food stamps but reports there is very  little food in his home and he has very little money to buy food. He receives assistance with securing community resources through the [facility].   Patient offered support and encouragement.  He agrees with plan for admission to [facility] behavioral health continuous observation while awaiting inpatient psychiatric treatment.  Reviewed medications including prior to admission medications, discussed potential side effects and offered patient opportunity to ask questions.  Discussed risperidone dosage, will decrease to 1 mg twice daily as patient has not had this medication for approximately 2 months.  Total Time spent with patient: 45 minutes  Musculoskeletal  Strength & Muscle Tone: within normal limits Gait & Station: normal Patient leans: N/A  Psychiatric Specialty Exam  Presentation General Appearance:  Appropriate for Environment; Casual  Eye Contact: Fair  Speech: Clear and Coherent; Normal Rate (repetitive hiccup-like sounds when speaking)  Speech Volume: Normal  Handedness: Right   Mood and Affect  Mood: Euthymic  Affect: Depressed   Thought Process  Thought Processes: Coherent; Goal Directed; Linear  Descriptions of Associations:Intact  Orientation:Full (Time, Place and Person)  Thought Content:Logical  Diagnosis of Schizophrenia or Schizoaffective disorder in past: No   Hallucinations:Hallucinations: None  Ideas of Reference:None  Suicidal Thoughts:Suicidal Thoughts: Yes, Active SI Active Intent and/or Plan: With Plan  Homicidal Thoughts:Homicidal Thoughts: No   Sensorium  Memory: Immediate Good; Recent Fair  Judgment: Intact  Insight: Shallow   Executive Functions  Concentration: Good  Attention Span: Good  Recall: Good  Fund of Knowledge: Good  Language: Good   Psychomotor Activity  Psychomotor Activity: Psychomotor Activity: Normal   Assets  Assets: Communication Skills; Desire for  Improvement; Financial Resources/Insurance; Housing; Physical Health; Resilience; Social Support   Sleep  Sleep: Sleep: Poor   Nutritional Assessment (For OBS and FBC admissions only) Has the patient had a weight loss or gain of 10 pounds or more in the last 3 months?: No Has the patient had a decrease in food intake/or appetite?: No Does the patient have dental problems?: No Does the patient have eating habits or behaviors that may be indicators of an eating disorder including binging or inducing vomiting?: No Has the patient recently lost weight without trying?: 0 Has the patient been eating poorly because of a decreased appetite?: 0 Malnutrition Screening Tool Score: 0    Physical Exam Vitals and nursing note reviewed.  Constitutional:      Appearance: Normal appearance. He is well-developed.  HENT:     Head: Normocephalic and atraumatic.  Cardiovascular:     Rate and Rhythm: Normal rate.  Pulmonary:     Effort: Pulmonary effort is normal.  Musculoskeletal:        General: Normal range of motion.     Cervical back: Normal range of motion.  Skin:    General: Skin is warm and dry.  Neurological:     Mental Status: He is alert and oriented to person, place, and time.  Psychiatric:        Attention and Perception: Attention and perception normal.        Mood and Affect: Affect normal. Mood is depressed.        Speech: Speech normal.  Behavior: Behavior normal. Behavior is cooperative.        Thought Content: Thought content includes suicidal ideation. Thought content includes suicidal plan.        Cognition and Memory: Cognition and memory normal.    Review of Systems  Constitutional: Negative.   HENT: Negative.    Eyes: Negative.   Respiratory: Negative.    Cardiovascular: Negative.   Gastrointestinal: Negative.   Genitourinary: Negative.   Musculoskeletal: Negative.   Skin: Negative.   Neurological: Negative.   Psychiatric/Behavioral:  Positive for  depression, substance abuse and suicidal ideas. The patient has insomnia.     Blood pressure (!) 140/90, pulse 92, temperature 98.6 F (37 C), temperature source Oral, resp. rate 18, SpO2 96%. There is no height or weight on file to calculate BMI.  Past Psychiatric History: PTSD, cocaine abuse, amphetamine abuse, substance-induced mood disorder, major depressive disorder, recurrent severe, suicide attempt, anxiety  Is the patient at risk to self? Yes  Has the patient been a risk to self in the past 6 months? Yes .    Has the patient been a risk to self within the distant past? Yes   Is the patient a risk to others? No   Has the patient been a risk to others in the past 6 months? No   Has the patient been a risk to others within the distant past? No   Past Medical History: Traumatic brain injury, hepatitis C, seizure disorder  Family History: None reported  Social History: Resides alone, currently not employed, endorses substance use  Last Labs:  Admission on 11/20/2022, Discharged on 11/21/2022  Component Date Value Ref Range Status   WBC 11/20/2022 7.6  4.0 - 10.5 K/uL Final   RBC 11/20/2022 5.21  4.22 - 5.81 MIL/uL Final   Hemoglobin 11/20/2022 16.1  13.0 - 17.0 g/dL Final   HCT 62/95/2841 47.0  39.0 - 52.0 % Final   MCV 11/20/2022 90.2  80.0 - 100.0 fL Final   MCH 11/20/2022 30.9  26.0 - 34.0 pg Final   MCHC 11/20/2022 34.3  30.0 - 36.0 g/dL Final   RDW 32/44/0102 13.1  11.5 - 15.5 % Final   Platelets 11/20/2022 308  150 - 400 K/uL Final   nRBC 11/20/2022 0.0  0.0 - 0.2 % Final   Neutrophils Relative % 11/20/2022 69  % Final   Neutro Abs 11/20/2022 5.2  1.7 - 7.7 K/uL Final   Lymphocytes Relative 11/20/2022 20  % Final   Lymphs Abs 11/20/2022 1.6  0.7 - 4.0 K/uL Final   Monocytes Relative 11/20/2022 10  % Final   Monocytes Absolute 11/20/2022 0.7  0.1 - 1.0 K/uL Final   Eosinophils Relative 11/20/2022 1  % Final   Eosinophils Absolute 11/20/2022 0.1  0.0 - 0.5 K/uL Final    Basophils Relative 11/20/2022 0  % Final   Basophils Absolute 11/20/2022 0.0  0.0 - 0.1 K/uL Final   Immature Granulocytes 11/20/2022 0  % Final   Abs Immature Granulocytes 11/20/2022 0.03  0.00 - 0.07 K/uL Final   Performed at [facility], [address], [place], [place] [address]   Sodium 11/20/2022 138  135 - 145 mmol/L Final   Potassium 11/20/2022 4.2  3.5 - 5.1 mmol/L Final   Chloride 11/20/2022 107  98 - 111 mmol/L Final   CO2 11/20/2022 20 (L)  22 - 32 mmol/L Final   Glucose, Bld 11/20/2022 124 (H)  70 - 99 mg/dL Final   Glucose reference  range applies only to samples taken after fasting for at least 8 hours.   BUN 11/20/2022 17  6 - 20 mg/dL Final   Creatinine, Ser 11/20/2022 0.74  0.61 - 1.24 mg/dL Final   Calcium 16/03/9603 9.2  8.9 - 10.3 mg/dL Final   Total Protein 54/01/8118 6.7  6.5 - 8.1 g/dL Final   Albumin 14/78/2956 4.1  3.5 - 5.0 g/dL Final   AST 21/30/8657 22  15 - 41 U/L Final   ALT 11/20/2022 21  0 - 44 U/L Final   Alkaline Phosphatase 11/20/2022 89  38 - 126 U/L Final   Total Bilirubin 11/20/2022 0.5  0.3 - 1.2 mg/dL Final   GFR, Estimated 11/20/2022 >60  >60 mL/min Final   Comment: (NOTE) Calculated using the CKD-EPI Creatinine Equation (2021)    Anion gap 11/20/2022 11  5 - 15 Final   Performed at [facility], [address], [place], [place] [address]   Magnesium 11/20/2022 2.5 (H)  1.7 - 2.4 mg/dL Final   Performed at [facility], [address], [place], [place] [address]   Alcohol, Ethyl (B) 11/20/2022 <10  <10 mg/dL Final   Comment: (NOTE) Lowest detectable limit for serum alcohol is 10 mg/dL.  For medical purposes only. Performed at [facility], [address] [place], [place] [address]    Prolactin 11/20/2022 19.5  3.9 - 22.7 ng/mL Final   Comment: (NOTE) Performed At: [facility] [address] [place], [place] [id number] Jolene Schimke MD GU:[id number]    POC Amphetamine UR 11/20/2022 None Detected   NONE DETECTED (Cut Off Level 1000 ng/mL) Final   POC Secobarbital (BAR) 11/20/2022 None Detected  NONE DETECTED (Cut Off Level 300 ng/mL) Final   POC Buprenorphine (BUP) 11/20/2022 None Detected  NONE DETECTED (Cut Off Level 10 ng/mL) Final   POC Oxazepam (BZO) 11/20/2022 None Detected  NONE DETECTED (Cut Off Level 300 ng/mL) Final   POC Cocaine UR 11/20/2022 None Detected  NONE DETECTED (Cut Off Level 300 ng/mL) Final   POC Methamphetamine UR 11/20/2022 None Detected  NONE DETECTED (Cut Off Level 1000 ng/mL) Final   POC Morphine 11/20/2022 None Detected  NONE DETECTED (Cut Off Level 300 ng/mL) Final   POC Methadone UR 11/20/2022 None Detected  NONE DETECTED (Cut Off Level 300 ng/mL) Final   POC Oxycodone UR 11/20/2022 None Detected  NONE DETECTED (Cut Off Level 100 ng/mL) Final   POC Marijuana UR 11/20/2022 Positive (A)  NONE DETECTED (Cut Off Level 50 ng/mL) Final   TSH 11/20/2022 0.896  0.350 - 4.500 uIU/mL Final   Comment: Performed by a 3rd Generation assay with a functional sensitivity of <=0.01 uIU/mL. Performed at [facility], [address], [place], [place] [address]   Admission on 10/28/2022, Discharged on 11/07/2022  Component Date Value Ref Range Status   Hgb A1c MFr Bld 10/29/2022 6.1 (H)  4.8 - 5.6 % Final   Comment: (NOTE)         Prediabetes: 5.7 - 6.4         Diabetes: >6.4         Glycemic control for adults with diabetes: <7.0    Mean Plasma Glucose 10/29/2022 128  mg/dL Final   Comment: (NOTE) Performed At: [facility] [address] [place], [place] [id number] Jolene Schimke MD OA:[id number]    Cholesterol 10/29/2022 210 (H)  0 - 200 mg/dL Final   Triglycerides 06/10/3233 337 (H)  <150 mg/dL Final  HDL 10/29/2022 50  >40 mg/dL Final   Total CHOL/HDL Ratio 10/29/2022 4.2  RATIO Final   VLDL 10/29/2022 67 (H)  0 - 40 mg/dL Final   LDL Cholesterol 10/29/2022 93  0 - 99 mg/dL Final   Comment:        Total Cholesterol/HDL:CHD Risk Coronary Heart  Disease Risk Table                     Men   Women  1/2 Average Risk   3.4   3.3  Average Risk       5.0   4.4  2 X Average Risk   9.6   7.1  3 X Average Risk  23.4   11.0        Use the calculated Patient Ratio above and the CHD Risk Table to determine the patient's CHD Risk.        ATP III CLASSIFICATION (LDL):  <100     mg/dL   Optimal  324-401  mg/dL   Near or Above                    Optimal  130-159  mg/dL   Borderline  027-253  mg/dL   High  >664     mg/dL   Very High Performed at [facility], [address] [place], [place] [address]   Admission on 10/27/2022, Discharged on 10/28/2022  Component Date Value Ref Range Status   Sodium 10/27/2022 136  135 - 145 mmol/L Final   Potassium 10/27/2022 3.7  3.5 - 5.1 mmol/L Final   Chloride 10/27/2022 106  98 - 111 mmol/L Final   CO2 10/27/2022 19 (L)  22 - 32 mmol/L Final   Glucose, Bld 10/27/2022 98  70 - 99 mg/dL Final   Glucose reference range applies only to samples taken after fasting for at least 8 hours.   BUN 10/27/2022 10  6 - 20 mg/dL Final   Creatinine, Ser 10/27/2022 0.70  0.61 - 1.24 mg/dL Final   Calcium 42/59/5638 9.1  8.9 - 10.3 mg/dL Final   Total Protein 75/64/3329 8.2 (H)  6.5 - 8.1 g/dL Final   Albumin 51/88/4166 4.7  3.5 - 5.0 g/dL Final   AST 11/30/1599 29  15 - 41 U/L Final   ALT 10/27/2022 27  0 - 44 U/L Final   Alkaline Phosphatase 10/27/2022 92  38 - 126 U/L Final   Total Bilirubin 10/27/2022 1.1  0.3 - 1.2 mg/dL Final   GFR, Estimated 10/27/2022 >60  >60 mL/min Final   Comment: (NOTE) Calculated using the CKD-EPI Creatinine Equation (2021)    Anion gap 10/27/2022 11  5 - 15 Final   Performed at [facility], [address], [place], [place] [address]   Alcohol, Ethyl (B) 10/27/2022 <10  <10 mg/dL Final   Comment: (NOTE) Lowest detectable limit for serum alcohol is 10 mg/dL.  For medical purposes only. Performed at [facility], [address], [place], [place] [address]    Salicylate Lvl 10/27/2022 <7.0 (L)  7.0 - 30.0 mg/dL Final   Performed at [facility], [address] [place], [place] [address]   Acetaminophen (Tylenol), Serum 10/27/2022 <10 (L)  10 - 30 ug/mL Final   Comment: (NOTE) Therapeutic concentrations vary significantly. A range of 10-30 ug/mL  may be an effective concentration for many patients. However, some  are best treated at concentrations outside of this  range. Acetaminophen concentrations >150 ug/mL at 4 hours after ingestion  and >50 ug/mL at 12 hours after ingestion are often associated with  toxic reactions.  Performed at [facility], [address] [place], [place] [address]    WBC 10/27/2022 6.6  4.0 - 10.5 K/uL Final   RBC 10/27/2022 5.12  4.22 - 5.81 MIL/uL Final   Hemoglobin 10/27/2022 16.0  13.0 - 17.0 g/dL Final   HCT 60/45/4098 45.8  39.0 - 52.0 % Final   MCV 10/27/2022 89.5  80.0 - 100.0 fL Final   MCH 10/27/2022 31.3  26.0 - 34.0 pg Final   MCHC 10/27/2022 34.9  30.0 - 36.0 g/dL Final   RDW 11/91/4782 13.2  11.5 - 15.5 % Final   Platelets 10/27/2022 259  150 - 400 K/uL Final   nRBC 10/27/2022 0.0  0.0 - 0.2 % Final   Performed at [facility], [address] [place], [place] [address]   Opiates 10/27/2022 NONE DETECTED  NONE DETECTED Final   Cocaine 10/27/2022 NONE DETECTED  NONE DETECTED Final   Benzodiazepines 10/27/2022 NONE DETECTED  NONE DETECTED Final   Amphetamines 10/27/2022 NONE DETECTED  NONE DETECTED Final   Tetrahydrocannabinol 10/27/2022 POSITIVE (A)  NONE DETECTED Final   Barbiturates 10/27/2022 NONE DETECTED  NONE DETECTED Final   Comment: (NOTE) DRUG SCREEN FOR MEDICAL PURPOSES ONLY.  IF CONFIRMATION IS NEEDED FOR ANY PURPOSE, NOTIFY LAB WITHIN 5 DAYS.  LOWEST DETECTABLE LIMITS FOR URINE DRUG SCREEN Drug Class                     Cutoff (ng/mL) Amphetamine and metabolites    1000 Barbiturate  and metabolites    200 Benzodiazepine                 200 Opiates and metabolites        300 Cocaine and metabolites        300 THC                            50 Performed at [facility], [address] [place], [place] [address]    Color, Urine 10/27/2022 STRAW (A)  YELLOW Final   APPearance 10/27/2022 CLEAR  CLEAR Final   Specific Gravity, Urine 10/27/2022 1.005  1.005 - 1.030 Final   pH 10/27/2022 5.0  5.0 - 8.0 Final   Glucose, UA 10/27/2022 NEGATIVE  NEGATIVE mg/dL Final   Hgb urine dipstick 10/27/2022 SMALL (A)  NEGATIVE Final   Bilirubin Urine 10/27/2022 NEGATIVE  NEGATIVE Final   Ketones, ur 10/27/2022 5 (A)  NEGATIVE mg/dL Final   Protein, ur 78/46/9629 NEGATIVE  NEGATIVE mg/dL Final   Nitrite 52/84/1324 NEGATIVE  NEGATIVE Final   Leukocytes,Ua 10/27/2022 NEGATIVE  NEGATIVE Final   RBC / HPF 10/27/2022 0-5  0 - 5 RBC/hpf Final   WBC, UA 10/27/2022 0-5  0 - 5 WBC/hpf Final   Bacteria, UA 10/27/2022 NONE SEEN  NONE SEEN Final   Squamous Epithelial / HPF 10/27/2022 0-5  0 - 5 /HPF Final   Mucus 10/27/2022 PRESENT   Final   Performed at [facility], [address] [place], [place] [address]  Admission on 10/08/2022, Discharged on 10/16/2022  Component Date Value Ref Range Status   Levetiracetam Lvl 10/09/2022 16.9  10.0 - 40.0 ug/mL Final   Comment: (NOTE) Performed At: [facility] [address] [place], [place] [id number]  Jolene Schimke MD IO:[id number]    Acetaminophen (Tylenol), Serum 10/09/2022 <10 (L)  10 - 30 ug/mL Final   Comment: (NOTE) Therapeutic concentrations vary significantly. A range of 10-30 ug/mL  may be an effective concentration for many patients. However, some  are best treated at concentrations outside of this range. Acetaminophen concentrations >150 ug/mL at 4 hours after ingestion  and >50 ug/mL at 12 hours after ingestion are often associated with  toxic reactions.  Performed at [facility], [address] [place], [place] [address]    TSH 10/12/2022 1.428  0.350 - 4.500 uIU/mL Final   Comment: Performed by a 3rd Generation assay with a functional sensitivity of <=0.01 uIU/mL. Performed at [facility], [address] [place], [place] [address]   Admission on 10/07/2022, Discharged on 10/08/2022  Component Date Value Ref Range Status   Sodium 10/07/2022 137  135 - 145 mmol/L Final   Potassium 10/07/2022 4.1  3.5 - 5.1 mmol/L Final   Chloride 10/07/2022 108  98 - 111 mmol/L Final   CO2 10/07/2022 19 (L)  22 - 32 mmol/L Final   Glucose, Bld 10/07/2022 101 (H)  70 - 99 mg/dL Final   Glucose reference range applies only to samples taken after fasting for at least 8 hours.   BUN 10/07/2022 9  6 - 20 mg/dL Final   Creatinine, Ser 10/07/2022 0.70  0.61 - 1.24 mg/dL Final   Calcium 36/64/4034 8.8 (L)  8.9 - 10.3 mg/dL Final   Total Protein 74/25/9563 7.1  6.5 - 8.1 g/dL Final   Albumin 87/56/4332 4.1  3.5 - 5.0 g/dL Final   AST 95/18/8416 34  15 - 41 U/L Final   ALT 10/07/2022 30  0 - 44 U/L Final   Alkaline Phosphatase 10/07/2022 88  38 - 126 U/L Final   Total Bilirubin 10/07/2022 0.6  0.3 - 1.2 mg/dL Final   GFR, Estimated 10/07/2022 >60  >60 mL/min Final   Comment: (NOTE) Calculated using the CKD-EPI Creatinine Equation (2021)    Anion gap 10/07/2022 10  5 - 15 Final   Performed at [facility], [address] [place], [place] [address]   Alcohol, Ethyl (B) 10/07/2022 <10  <10 mg/dL Final   Comment: (NOTE) Lowest detectable limit for serum alcohol is 10 mg/dL.  For medical purposes only. Performed at [facility], [address], [place], [place] [address]    Opiates 10/07/2022 NONE DETECTED  NONE DETECTED Final   Cocaine 10/07/2022 NONE DETECTED  NONE DETECTED Final   Benzodiazepines 10/07/2022 NONE DETECTED  NONE DETECTED Final   Amphetamines 10/07/2022 NONE DETECTED  NONE DETECTED Final    Tetrahydrocannabinol 10/07/2022 POSITIVE (A)  NONE DETECTED Final   Barbiturates 10/07/2022 NONE DETECTED  NONE DETECTED Final   Comment: (NOTE) DRUG SCREEN FOR MEDICAL PURPOSES ONLY.  IF CONFIRMATION IS NEEDED FOR ANY PURPOSE, NOTIFY LAB WITHIN 5 DAYS.  LOWEST DETECTABLE LIMITS FOR URINE DRUG SCREEN Drug Class                     Cutoff (ng/mL) Amphetamine and metabolites    1000 Barbiturate and metabolites    200 Benzodiazepine                 200 Opiates and metabolites        300 Cocaine and metabolites        300 THC  50 Performed at [facility], [address] [place], [place] [address]    WBC 10/07/2022 7.9  4.0 - 10.5 K/uL Final   RBC 10/07/2022 5.02  4.22 - 5.81 MIL/uL Final   Hemoglobin 10/07/2022 15.6  13.0 - 17.0 g/dL Final   HCT 84/69/6295 46.5  39.0 - 52.0 % Final   MCV 10/07/2022 92.6  80.0 - 100.0 fL Final   MCH 10/07/2022 31.1  26.0 - 34.0 pg Final   MCHC 10/07/2022 33.5  30.0 - 36.0 g/dL Final   RDW 28/41/3244 14.0  11.5 - 15.5 % Final   Platelets 10/07/2022 307  150 - 400 K/uL Final   nRBC 10/07/2022 0.0  0.0 - 0.2 % Final   Neutrophils Relative % 10/07/2022 71  % Final   Neutro Abs 10/07/2022 5.6  1.7 - 7.7 K/uL Final   Lymphocytes Relative 10/07/2022 19  % Final   Lymphs Abs 10/07/2022 1.5  0.7 - 4.0 K/uL Final   Monocytes Relative 10/07/2022 8  % Final   Monocytes Absolute 10/07/2022 0.6  0.1 - 1.0 K/uL Final   Eosinophils Relative 10/07/2022 1  % Final   Eosinophils Absolute 10/07/2022 0.1  0.0 - 0.5 K/uL Final   Basophils Relative 10/07/2022 1  % Final   Basophils Absolute 10/07/2022 0.1  0.0 - 0.1 K/uL Final   Immature Granulocytes 10/07/2022 0  % Final   Abs Immature Granulocytes 10/07/2022 0.03  0.00 - 0.07 K/uL Final   Performed at [facility], [address], [place], [place] [address]   Levetiracetam Lvl 10/08/2022 <2.0 (L)  10.0 - 40.0 ug/mL Final   Comment: (NOTE) Performed  At: [facility] [address] [place], [place] [id number] Jolene Schimke MD KV:[id number]   Admission on 09/27/2022, Discharged on 09/28/2022  Component Date Value Ref Range Status   Sodium 09/27/2022 136  135 - 145 mmol/L Final   Potassium 09/27/2022 3.4 (L)  3.5 - 5.1 mmol/L Final   Chloride 09/27/2022 105  98 - 111 mmol/L Final   CO2 09/27/2022 18 (L)  22 - 32 mmol/L Final   Glucose, Bld 09/27/2022 106 (H)  70 - 99 mg/dL Final   Glucose reference range applies only to samples taken after fasting for at least 8 hours.   BUN 09/27/2022 10  6 - 20 mg/dL Final   Creatinine, Ser 09/27/2022 0.78  0.61 - 1.24 mg/dL Final   Calcium 64/33/2951 8.7 (L)  8.9 - 10.3 mg/dL Final   Total Protein 88/41/6606 6.9  6.5 - 8.1 g/dL Final   Albumin 30/16/0109 3.8  3.5 - 5.0 g/dL Final   AST 32/35/5732 23  15 - 41 U/L Final   ALT 09/27/2022 32  0 - 44 U/L Final   Alkaline Phosphatase 09/27/2022 96  38 - 126 U/L Final   Total Bilirubin 09/27/2022 0.3  0.3 - 1.2 mg/dL Final   GFR, Estimated 09/27/2022 >60  >60 mL/min Final   Comment: (NOTE) Calculated using the CKD-EPI Creatinine Equation (2021)    Anion gap 09/27/2022 13  5 - 15 Final   Performed at [facility], [address], [place], [place] [address]   Alcohol, Ethyl (B) 09/27/2022 47 (H)  <10 mg/dL Final   Comment: (NOTE) Lowest detectable limit for serum alcohol is 10 mg/dL.  For medical purposes only. Performed at [facility], [address], [place], [place] [address]    Opiates 09/27/2022 NONE DETECTED  NONE DETECTED Final  Cocaine 09/27/2022 NONE DETECTED  NONE DETECTED Final   Benzodiazepines 09/27/2022 NONE DETECTED  NONE DETECTED Final   Amphetamines 09/27/2022 NONE DETECTED  NONE DETECTED Final   Tetrahydrocannabinol 09/27/2022 POSITIVE (A)  NONE DETECTED Final   Barbiturates 09/27/2022 NONE DETECTED  NONE DETECTED Final   Comment: (NOTE) DRUG SCREEN FOR MEDICAL PURPOSES ONLY.  IF CONFIRMATION IS  NEEDED FOR ANY PURPOSE, NOTIFY LAB WITHIN 5 DAYS.  LOWEST DETECTABLE LIMITS FOR URINE DRUG SCREEN Drug Class                     Cutoff (ng/mL) Amphetamine and metabolites    1000 Barbiturate and metabolites    200 Benzodiazepine                 200 Opiates and metabolites        300 Cocaine and metabolites        300 THC                            50 Performed at [facility], [address], [place], [place] [address]    WBC 09/27/2022 9.2  4.0 - 10.5 K/uL Final   RBC 09/27/2022 4.83  4.22 - 5.81 MIL/uL Final   Hemoglobin 09/27/2022 14.9  13.0 - 17.0 g/dL Final   HCT 60/45/4098 44.8  39.0 - 52.0 % Final   MCV 09/27/2022 92.8  80.0 - 100.0 fL Final   MCH 09/27/2022 30.8  26.0 - 34.0 pg Final   MCHC 09/27/2022 33.3  30.0 - 36.0 g/dL Final   RDW 11/91/4782 13.8  11.5 - 15.5 % Final   Platelets 09/27/2022 298  150 - 400 K/uL Final   nRBC 09/27/2022 0.0  0.0 - 0.2 % Final   Neutrophils Relative % 09/27/2022 72  % Final   Neutro Abs 09/27/2022 6.6  1.7 - 7.7 K/uL Final   Lymphocytes Relative 09/27/2022 20  % Final   Lymphs Abs 09/27/2022 1.9  0.7 - 4.0 K/uL Final   Monocytes Relative 09/27/2022 7  % Final   Monocytes Absolute 09/27/2022 0.7  0.1 - 1.0 K/uL Final   Eosinophils Relative 09/27/2022 1  % Final   Eosinophils Absolute 09/27/2022 0.1  0.0 - 0.5 K/uL Final   Basophils Relative 09/27/2022 0  % Final   Basophils Absolute 09/27/2022 0.0  0.0 - 0.1 K/uL Final   Immature Granulocytes 09/27/2022 0  % Final   Abs Immature Granulocytes 09/27/2022 0.04  0.00 - 0.07 K/uL Final   Performed at [facility], [address], [place], [place] [address]   Salicylate Lvl 09/27/2022 <7.0 (L)  7.0 - 30.0 mg/dL Final   Performed at [facility], [address], [place], [place] [address]   Acetaminophen (Tylenol), Serum 09/27/2022 <10 (L)  10 - 30 ug/mL Final   Comment: (NOTE) Therapeutic concentrations vary significantly. A range of 10-30 ug/mL  may be an effective  concentration for many patients. However, some  are best treated at concentrations outside of this range. Acetaminophen concentrations >150 ug/mL at 4 hours after ingestion  and >50 ug/mL at 12 hours after ingestion are often associated with  toxic reactions.  Performed at [facility], [address] [place], [place] [address]    Troponin I (High Sensitivity) 09/27/2022 3  <18 ng/L Final   Comment: (NOTE) Elevated high sensitivity troponin I (hsTnI) values and significant  changes across serial  measurements may suggest ACS but many other  chronic and acute conditions are known to elevate hsTnI results.  Refer to the "Links" section for chest pain algorithms and additional  guidance. Performed at [facility], [address] [place], [place] [address]    SARS Coronavirus 2 by RT PCR 09/27/2022 POSITIVE (A)  NEGATIVE Final   Performed at [facility], [address] [place], [place] [address]   Levetiracetam Lvl 09/27/2022 <2.0 (L)  10.0 - 40.0 ug/mL Final   Comment: (NOTE) Performed At: [facility] [address] [place], [place] [id number] Jolene Schimke MD MW:[id number]    Acetaminophen (Tylenol), Serum 09/28/2022 <10 (L)  10 - 30 ug/mL Final   Comment: (NOTE) Therapeutic concentrations vary significantly. A range of 10-30 ug/mL  may be an effective concentration for many patients. However, some  are best treated at concentrations outside of this range. Acetaminophen concentrations >150 ug/mL at 4 hours after ingestion  and >50 ug/mL at 12 hours after ingestion are often associated with  toxic reactions.  Performed at [facility], [address] [place], [place] [address]   Admission on 09/20/2022, Discharged on 09/22/2022  Component Date Value Ref Range Status   Sodium 09/20/2022 134 (L)  135 - 145 mmol/L Final   Potassium 09/20/2022 3.5  3.5 - 5.1 mmol/L Final   Chloride 09/20/2022 103  98 - 111 mmol/L Final   CO2 09/20/2022 19 (L)   22 - 32 mmol/L Final   Glucose, Bld 09/20/2022 89  70 - 99 mg/dL Final   Glucose reference range applies only to samples taken after fasting for at least 8 hours.   BUN 09/20/2022 8  6 - 20 mg/dL Final   Creatinine, Ser 09/20/2022 1.03  0.61 - 1.24 mg/dL Final   Calcium 64/40/3474 9.2  8.9 - 10.3 mg/dL Final   Total Protein 25/95/6387 7.8  6.5 - 8.1 g/dL Final   Albumin 56/43/3295 4.6  3.5 - 5.0 g/dL Final   AST 18/84/1660 31  15 - 41 U/L Final   ALT 09/20/2022 32  0 - 44 U/L Final   Alkaline Phosphatase 09/20/2022 124  38 - 126 U/L Final   Total Bilirubin 09/20/2022 1.7 (H)  0.3 - 1.2 mg/dL Final   GFR, Estimated 09/20/2022 >60  >60 mL/min Final   Comment: (NOTE) Calculated using the CKD-EPI Creatinine Equation (2021)    Anion gap 09/20/2022 12  5 - 15 Final   Performed at [facility], [address], [place], [place] [address]   Alcohol, Ethyl (B) 09/20/2022 <10  <10 mg/dL Final   Comment: (NOTE) Lowest detectable limit for serum alcohol is 10 mg/dL.  For medical purposes only. Performed at [facility], [address] [place], [place] [address]    Salicylate Lvl 09/20/2022 <7.0 (L)  7.0 - 30.0 mg/dL Final   Performed at [facility], [address] [place], [place] [address]   Acetaminophen (Tylenol), Serum 09/20/2022 <10 (L)  10 - 30 ug/mL Final   Comment: (NOTE) Therapeutic concentrations vary significantly. A range of 10-30 ug/mL  may be an effective concentration for many patients. However, some  are best treated at concentrations outside of this range. Acetaminophen concentrations >150 ug/mL at 4 hours after ingestion  and >50 ug/mL at 12 hours after ingestion are often associated with  toxic reactions.  Performed at [facility], [address] [place], [place] [address]    WBC  09/20/2022 7.9  4.0 - 10.5 K/uL Final   RBC 09/20/2022 5.21  4.22 - 5.81 MIL/uL Final   Hemoglobin 09/20/2022 16.3  13.0 - 17.0 g/dL Final   HCT 16/03/9603 46.0   39.0 - 52.0 % Final   MCV 09/20/2022 88.3  80.0 - 100.0 fL Final   MCH 09/20/2022 31.3  26.0 - 34.0 pg Final   MCHC 09/20/2022 35.4  30.0 - 36.0 g/dL Final   RDW 54/01/8118 14.1  11.5 - 15.5 % Final   Platelets 09/20/2022 266  150 - 400 K/uL Final   nRBC 09/20/2022 0.0  0.0 - 0.2 % Final   Performed at [facility], [address], [place], [place] [address]   Opiates 09/20/2022 NONE DETECTED  NONE DETECTED Final   Cocaine 09/20/2022 POSITIVE (A)  NONE DETECTED Final   Benzodiazepines 09/20/2022 NONE DETECTED  NONE DETECTED Final   Amphetamines 09/20/2022 POSITIVE (A)  NONE DETECTED Final   Tetrahydrocannabinol 09/20/2022 POSITIVE (A)  NONE DETECTED Final   Barbiturates 09/20/2022 NONE DETECTED  NONE DETECTED Final   Comment: (NOTE) DRUG SCREEN FOR MEDICAL PURPOSES ONLY.  IF CONFIRMATION IS NEEDED FOR ANY PURPOSE, NOTIFY LAB WITHIN 5 DAYS.  LOWEST DETECTABLE LIMITS FOR URINE DRUG SCREEN Drug Class                     Cutoff (ng/mL) Amphetamine and metabolites    1000 Barbiturate and metabolites    200 Benzodiazepine                 200 Opiates and metabolites        300 Cocaine and metabolites        300 THC                            50 Performed at [facility], [address], [place], [place] [address]   Community Documentation on 08/13/2022  Component Date Value Ref Range Status   Glucose 08/13/2022 151   Final    Allergies: Other and Tylenol [acetaminophen]  Medications:  Facility Ordered Medications  Medication   alum & mag hydroxide-simeth (MAALOX/MYLANTA) 200-200-20 MG/5ML suspension 30 mL   magnesium hydroxide (MILK OF MAGNESIA) suspension 30 mL   hydrOXYzine (ATARAX) tablet 25 mg   albuterol (VENTOLIN HFA) 108 (90 Base) MCG/ACT inhaler 2 puff   [START ON 01/23/2023] FLUoxetine (PROZAC) capsule 40 mg   levETIRAcetam (KEPPRA) tablet 500 mg   [START ON 01/23/2023] lisinopril (ZESTRIL) tablet 10 mg   prazosin (MINIPRESS) capsule 1 mg   risperiDONE  (RISPERDAL) tablet 1 mg   traZODone (DESYREL) tablet 100 mg   nicotine (NICODERM CQ - dosed in mg/24 hours) patch 14 mg   PTA Medications  Medication Sig   lisinopril (ZESTRIL) 10 MG tablet Take 1 tablet (10 mg total) by mouth daily.   hydrOXYzine (ATARAX) 50 MG tablet Take 1 tablet (50 mg total) by mouth every 6 (six) hours as needed for anxiety.   albuterol (VENTOLIN HFA) 108 (90 Base) MCG/ACT inhaler Inhale 2 puffs into the lungs every 4 (four) hours as needed for wheezing or shortness of breath.   prazosin (MINIPRESS) 1 MG capsule Take 1 capsule (1 mg total) by mouth at bedtime.   FLUoxetine (PROZAC) 40 MG capsule Take 1 capsule (40 mg total) by mouth daily.   risperiDONE (RISPERDAL) 2 MG tablet Take 1 tablet (2 mg total) by mouth 2 (two) times daily.  traZODone (DESYREL) 100 MG tablet Take 1 tablet (100 mg total) by mouth at bedtime.   levETIRAcetam (KEPPRA) 500 MG tablet Take 1 tablet (500 mg total) by mouth every 12 (twelve) hours.   nicotine polacrilex (NICORETTE) 4 MG gum Take 1 each (4 mg total) by mouth 4 (four) times daily as needed for smoking cessation.      Medical Decision Making  Patient remains voluntary.  He will be admitted to [facility] behavioral health continuous observation unit while awaiting inpatient psychiatric treatment.  Laboratory studies ordered including CBC, CMP, ethanol, magnesium, prolactin and TSH.  Urine drug screen order initiated.  EKG ordered.  Current medications: -Maalox 30 mL oral every 4 as needed/digestion -Hydroxyzine 25 mg 3 times daily as needed/anxiety -Magnesium hydroxide 30 mL daily as needed/mild constipation -NicoDerm 14 mg transdermal patch daily/nicotine withdrawal  Prior to admission medications initiated: -Albuterol 108 mcg 2 puff inhalation every 4 hours as needed wheezing or shortness of breath -Fluoxetine 40 mg daily -Levetiracetam 500 mg every 12 hours -Lisinopril 10 mg daily -Prazosin 1 mg nightly -Risperidone 1  mg twice daily -Trazodone 100 mg nightly      Recommendations  Based on my evaluation the patient does not appear to have an emergency medical condition.  Lenard Lance, FNP 01/22/23  6:33 PM
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
## 2023-01-22 NOTE — ED Notes (Signed)
Pt A&O x 4, presents with suicidal ideations, ingested 4 Prozac pills, Keppra and Trazodone., 2 days ago.  Pt is  homeless, disheveled with body odor.  Comfort measures given.  MOnitoring for safety.  No seizure activity noted at present.

## 2023-01-22 NOTE — Progress Notes (Addendum)
   01/22/23 1719  [facility] Triage Screening (Walk-ins at [facility] only)  How Did You Hear About [place]? Legal System  What Is the Reason for Your Visit/Call Today? Pt presents to [facility] escorted by [employer] due ongoing depression symptoms.Pt reports he was feeling suicidal yesterday after smoking laced marijuana. Pt states the marijuana was laced with meth. Pt states that he did not want to come to this facility he wanted to go to [facility]. Pt reports yesterday he was suicidal with a plan to overdose on medication. Pt reports a suicide attempt yesterday by taking 10 pills. Pt reports he is feeling dizzy at this moment. Pt denies HI and AVH.  How Long Has This Been Causing You Problems? <Week  Have You Recently Had Any Thoughts About Hurting Yourself? Yes  How long ago did you have thoughts about hurting yourself? today  Are You Planning to Commit Suicide/Harm Yourself At This time? Yes (attempted yesterday, was not hospitalized)  Have you Recently Had Thoughts About Hurting Someone Karolee Ohs? No  Are You Planning To Harm Someone At This Time? No  Are you currently experiencing any auditory, visual or other hallucinations? No  Have You Used Any Alcohol or Drugs in the Past 24 Hours? Yes  How long ago did you use Drugs or Alcohol? today  What Did You Use and How Much? marijuana  Do you have any current medical co-morbidities that require immediate attention? No  Clinician description of patient physical appearance/behavior: anxious, guarded  What Do You Feel Would Help You the Most Today? Treatment for Depression or other mood problem  If access to [facility] Ur[facility] was not available, would you have sought care in the Emergency Department? No  Determination of Need Emergent (2 hours)  Options For Referral Inpatient Hospitalization;ED Visit

## 2023-01-22 NOTE — ED Notes (Signed)
Patient observed/assessed in bed/chair resting quietly appearing in no distress and verbalizing no complaints at this time. Will continue to monitor.  

## 2023-01-23 ENCOUNTER — Encounter: Payer: Self-pay | Admitting: Psychiatry

## 2023-01-23 ENCOUNTER — Inpatient Hospital Stay
Admission: AD | Admit: 2023-01-23 | Discharge: 2023-01-29 | DRG: 885 | Disposition: A | Discharge status: Home / Self Care | Payer: BLUE CROSS/BLUE SHIELD | Source: Intra-hospital | Attending: Psychiatry | Admitting: Psychiatry

## 2023-01-23 DIAGNOSIS — F952 Tourette's disorder: Secondary | ICD-10-CM | POA: Diagnosis present

## 2023-01-23 DIAGNOSIS — Z87820 Personal history of traumatic brain injury: Secondary | ICD-10-CM

## 2023-01-23 DIAGNOSIS — F1590 Other stimulant use, unspecified, uncomplicated: Secondary | ICD-10-CM | POA: Diagnosis present

## 2023-01-23 DIAGNOSIS — Z5982 Transportation insecurity: Secondary | ICD-10-CM

## 2023-01-23 DIAGNOSIS — G4700 Insomnia, unspecified: Secondary | ICD-10-CM | POA: Diagnosis present

## 2023-01-23 DIAGNOSIS — R569 Unspecified convulsions: Secondary | ICD-10-CM | POA: Diagnosis present

## 2023-01-23 DIAGNOSIS — Z5941 Food insecurity: Secondary | ICD-10-CM

## 2023-01-23 DIAGNOSIS — F329 Major depressive disorder, single episode, unspecified: Secondary | ICD-10-CM | POA: Diagnosis present

## 2023-01-23 DIAGNOSIS — F332 Major depressive disorder, recurrent severe without psychotic features: Principal | ICD-10-CM | POA: Diagnosis present

## 2023-01-23 DIAGNOSIS — F4310 Post-traumatic stress disorder, unspecified: Secondary | ICD-10-CM | POA: Diagnosis present

## 2023-01-23 DIAGNOSIS — F17210 Nicotine dependence, cigarettes, uncomplicated: Secondary | ICD-10-CM | POA: Diagnosis present

## 2023-01-23 DIAGNOSIS — Z604 Social exclusion and rejection: Secondary | ICD-10-CM | POA: Diagnosis present

## 2023-01-23 DIAGNOSIS — R440 Auditory hallucinations: Secondary | ICD-10-CM | POA: Diagnosis present

## 2023-01-23 DIAGNOSIS — Z9151 Personal history of suicidal behavior: Secondary | ICD-10-CM

## 2023-01-23 DIAGNOSIS — Z79899 Other long term (current) drug therapy: Secondary | ICD-10-CM | POA: Diagnosis not present

## 2023-01-23 DIAGNOSIS — R45851 Suicidal ideations: Secondary | ICD-10-CM | POA: Diagnosis present

## 2023-01-23 MED ORDER — HYDROXYZINE HCL 50 MG PO TABS
50.0000 mg | ORAL_TABLET | Freq: Four times a day (QID) | ORAL | Status: DC | PRN
  Administered 2023-01-24: 50 mg via ORAL
  Filled 2023-01-23: qty 1

## 2023-01-23 MED ORDER — TRAZODONE HCL 100 MG PO TABS
100.0000 mg | ORAL_TABLET | Freq: Every day | ORAL | Status: DC
  Administered 2023-01-23 – 2023-01-28 (×6): 100 mg via ORAL
  Filled 2023-01-23 (×6): qty 1

## 2023-01-23 MED ORDER — LEVETIRACETAM 500 MG PO TABS
500.0000 mg | ORAL_TABLET | Freq: Two times a day (BID) | ORAL | Status: DC
  Administered 2023-01-23 – 2023-01-24 (×2): 500 mg via ORAL
  Filled 2023-01-23 (×2): qty 1

## 2023-01-23 MED ORDER — ACETAMINOPHEN 325 MG PO TABS: 650.0000 mg | ORAL_TABLET | Freq: Four times a day (QID) | ORAL | Status: DC | PRN

## 2023-01-23 MED ORDER — ALBUTEROL SULFATE (2.5 MG/3ML) 0.083% IN NEBU: 3.0000 mL | INHALATION_SOLUTION | RESPIRATORY_TRACT | Status: DC | PRN

## 2023-01-23 MED ORDER — FLUOXETINE HCL 20 MG PO CAPS
40.0000 mg | ORAL_CAPSULE | Freq: Every day | ORAL | Status: DC
  Administered 2023-01-24 – 2023-01-29 (×6): 40 mg via ORAL
  Filled 2023-01-23 (×7): qty 2

## 2023-01-23 MED ORDER — LISINOPRIL 5 MG PO TABS
10.0000 mg | ORAL_TABLET | Freq: Every day | ORAL | Status: DC
  Administered 2023-01-24 – 2023-01-29 (×6): 10 mg via ORAL
  Filled 2023-01-23 (×6): qty 2

## 2023-01-23 MED ORDER — PREDNISONE 20 MG PO TABS: 40.0000 mg | ORAL_TABLET | Freq: Every day | ORAL | Status: DC

## 2023-01-23 MED ORDER — RISPERIDONE 1 MG PO TABS
2.0000 mg | ORAL_TABLET | Freq: Two times a day (BID) | ORAL | Status: DC
  Administered 2023-01-23 – 2023-01-29 (×12): 2 mg via ORAL
  Filled 2023-01-23 (×13): qty 2

## 2023-01-23 MED ORDER — NICOTINE POLACRILEX 2 MG MT GUM
4.0000 mg | CHEWING_GUM | Freq: Four times a day (QID) | OROMUCOSAL | Status: DC | PRN
  Administered 2023-01-23 – 2023-01-28 (×4): 4 mg via ORAL
  Filled 2023-01-23 (×4): qty 2

## 2023-01-23 MED ORDER — PREDNISONE 20 MG PO TABS
40.0000 mg | ORAL_TABLET | Freq: Every day | ORAL | Status: DC
  Administered 2023-01-23: 40 mg via ORAL
  Filled 2023-01-23: qty 2

## 2023-01-23 MED ORDER — MAGNESIUM HYDROXIDE 400 MG/5ML PO SUSP: 30.0000 mL | Freq: Every day | ORAL | Status: DC | PRN

## 2023-01-23 MED ORDER — ALUM & MAG HYDROXIDE-SIMETH 200-200-20 MG/5ML PO SUSP: 30.0000 mL | ORAL | Status: DC | PRN

## 2023-01-23 MED ORDER — PRAZOSIN HCL 1 MG PO CAPS
1.0000 mg | ORAL_CAPSULE | Freq: Every day | ORAL | Status: DC
  Administered 2023-01-23 – 2023-01-28 (×6): 1 mg via ORAL
  Filled 2023-01-23 (×6): qty 1

## 2023-01-23 NOTE — Plan of Care (Signed)
New admission.   Problem: Education: Goal: Knowledge of General Education information will improve Description: Including pain rating scale, medication(s)/side effects and non-pharmacologic comfort measures Outcome: Not Progressing   Problem: Health Behavior/Discharge Planning: Goal: Ability to manage health-related needs will improve Outcome: Not Progressing   Problem: Clinical Measurements: Goal: Ability to maintain clinical measurements within normal limits will improve Outcome: Not Progressing Goal: Will remain free from infection Outcome: Not Progressing Goal: Diagnostic test results will improve Outcome: Not Progressing Goal: Respiratory complications will improve Outcome: Not Progressing Goal: Cardiovascular complication will be avoided Outcome: Not Progressing   Problem: Activity: Goal: Risk for activity intolerance will decrease Outcome: Not Progressing   Problem: Nutrition: Goal: Adequate nutrition will be maintained Outcome: Not Progressing   Problem: Coping: Goal: Level of anxiety will decrease Outcome: Not Progressing   Problem: Elimination: Goal: Will not experience complications related to bowel motility Outcome: Not Progressing Goal: Will not experience complications related to urinary retention Outcome: Not Progressing   Problem: Pain Managment: Goal: General experience of comfort will improve Outcome: Not Progressing   Problem: Safety: Goal: Ability to remain free from injury will improve Outcome: Not Progressing   Problem: Skin Integrity: Goal: Risk for impaired skin integrity will decrease Outcome: Not Progressing   Problem: Education: Goal: Knowledge of [place] [facility] Education information/materials will improve Outcome: Not Progressing Goal: Emotional status will improve Outcome: Not Progressing Goal: Mental status will improve Outcome: Not Progressing Goal: Verbalization of understanding the information provided will  improve Outcome: Not Progressing   Problem: Activity: Goal: Interest or engagement in activities will improve Outcome: Not Progressing Goal: Sleeping patterns will improve Outcome: Not Progressing   Problem: Coping: Goal: Ability to verbalize frustrations and anger appropriately will improve Outcome: Not Progressing Goal: Ability to demonstrate self-control will improve Outcome: Not Progressing   Problem: Health Behavior/Discharge Planning: Goal: Identification of resources available to assist in meeting health care needs will improve Outcome: Not Progressing Goal: Compliance with treatment plan for underlying cause of condition will improve Outcome: Not Progressing   Problem: Physical Regulation: Goal: Ability to maintain clinical measurements within normal limits will improve Outcome: Not Progressing   Problem: Safety: Goal: Periods of time without injury will increase Outcome: Not Progressing   Problem: Self-Concept: Goal: Ability to identify factors that promote anxiety will improve Outcome: Not Progressing Goal: Level of anxiety will decrease Outcome: Not Progressing Goal: Ability to modify response to factors that promote anxiety will improve Outcome: Not Progressing   Problem: Coping: Goal: Coping ability will improve Outcome: Not Progressing Goal: Will verbalize feelings Outcome: Not Progressing   Problem: Coping: Goal: Coping ability will improve Outcome: Not Progressing   Problem: Health Behavior/Discharge Planning: Goal: Identification of resources available to assist in meeting health care needs will improve Outcome: Not Progressing

## 2023-01-23 NOTE — ED Notes (Signed)
Patient complaint of chest pain and difficulty breathing, vitals have been taken, Cala Bradford NP has been advised. Cala Bradford NP is now speaking with patient.

## 2023-01-23 NOTE — Progress Notes (Signed)
Admission Note:   Report was received from Gar Gibbon, RN on a 42 year old male who presents IVC in no acute distress for the treatment of SI and Depression. Patient was calm and cooperative with admission process. Patient presents with passive SI and contracts for safety upon admission, stating "Ive been thinking about it, but I'm cool now". Patient endorses depression, anxiety, PTSD, and panic attacks, stating that "I miss my Mom, she passed away three years ago and I felt like everybody around me wanted me gone". Patient denies HI/AVH and pain at this time. Patient stated that his goals for treatment are to "be able to talk to other people and go to groups to clear my mind. Anything that'll help me out. Patient has a past medical history of Asthma, Depression, Anxiety, and PTSD. Skin was assessed with Cara. MHT and found to be clear of any abnormal marks apart from an abrasion to his right lower leg and redness to his right heel. Patient searched and no contraband found and unit policies explained and understanding verbalized. Consents obtained. Food and fluids offered, and both accepted. Patient had no additional questions or concerns to voice to this Clinical research associate. Patient remains safe on the unit.

## 2023-01-23 NOTE — Group Note (Signed)
[facility] LCSW Group Therapy Note   Group Date: 01/23/2023 Start Time: 1315 End Time: 1415   Type of Therapy/Topic:  Group Therapy:  Emotion Regulation  Participation Level:  Active   Mood:  Description of Group:    The purpose of this group is to assist patients in learning to regulate negative emotions and experience positive emotions. Patients will be guided to discuss ways in which they have been vulnerable to their negative emotions. These vulnerabilities will be juxtaposed with experiences of positive emotions or situations, and patients challenged to use positive emotions to combat negative ones. Special emphasis will be placed on coping with negative emotions in conflict situations, and patients will process healthy conflict resolution skills.  Therapeutic Goals: Patient will identify two positive emotions or experiences to reflect on in order to balance out negative emotions:  Patient will label two or more emotions that they find the most difficult to experience:  Patient will be able to demonstrate positive conflict resolution skills through discussion or role plays:   Summary of Patient Progress: Patient was present in group. Patient was intrusive and often interrupted, however, was able to be redirected.  Patient was engaged and supportive.    Therapeutic Modalities:   Cognitive Behavioral Therapy Feelings Identification Dialectical Behavioral Therapy   Harden Mo, LCSW

## 2023-01-23 NOTE — Group Note (Signed)
Recreation Therapy Group Note   Group Topic:Leisure Education  Group Date: 01/23/2023 Start Time: 1000 End Time: 1105 Facilitators: Rosina Lowenstein, LRT, CTRS Location:  Craft Room  Group Description: Leisure. Patients were given the option to choose from coloring mandalas, singing karaoke, journaling, or playing cards. LRT and pts discussed the meaning of leisure, the importance of participating in leisure during their free time/when they're outside of the hospital, as well as how our leisure interests can also serve as coping skills. Pt identified two leisure interests and shared with the group.   Goal Area(s) Addressed:  Patient will identify a current leisure interest.  Patient will learn the definition of "leisure". Patient will practice making a positive decision. Patient will have the opportunity to try a new leisure activity. Patient will communicate with peers and LRT.    Affect/Mood: N/A   Participation Level: Did not attend    Clinical Observations/Individualized Feedback: Raymond [name] did not attend group due to being a new admission.  Plan: Continue to engage patient in RT group sessions 2-3x/week.   Rosina Lowenstein, LRT, CTRS 01/23/2023 12:25 PM

## 2023-01-23 NOTE — ED Notes (Signed)
Pt sleeping at present, no distress noted.  Monitoring for safety. 

## 2023-01-23 NOTE — ED Provider Notes (Signed)
FBC/OBS ASAP Discharge Summary  Date and Time: 01/23/2023 8:55 AM  Name: Raymond [name]  MRN:  [id number]   Discharge Diagnoses:  Final diagnoses:  Major depressive disorder, recurrent, severe without psychotic behavior (HCC)  Cocaine use disorder (HCC)    Subjective: "My chest has been tight for days, I need my new asthma medication"  Stay Summary: Raymond [name] 42 year old male with a known history of MDD, PTSD, Cocaine- use disorder and asthma, admitted to continuous assessment unit 01/22/2023 with suicidal ideations and a belief that some laced marijuana with cocaine and methamphetamines x 1 day ago. 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 He is followed by [facility] for mental health and medical care, however also has [facility] listed on [place] chart as a primary care.  This morning patient reported to RN chest heaviness with deep breathing and told this writer these symptoms have been present for days as his "wheezing" has been worsening. Reports receiving a new LABA inhaler yesterday for asthma control, although doesn't recall the name of the inhaler. After use of albuterol, patient reported improvement of chest tightness and shortness of breath.  During this admission, patient was continued on home medications: -Albuterol 108 mcg 2 puff inhalation every 4 hours as needed wheezing or shortness of breath -Fluoxetine 40 mg daily -Levetiracetam 500 mg every 12 hours -Lisinopril 10 mg daily -Prazosin 1 mg nightly -Risperidone 1 mg twice daily -Trazodone 100 mg nightly  Inpatient psychiatric treatment was recommended and patient has been accepted to [facility].  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 Smokes marijuana almost daily. Originally from   Tobacco Cessation:  Prescription not provided because: patient transferring to inpatient psychiatric facility   Current Medications:  Current Facility-Administered Medications  Medication Dose Route Frequency Provider Last Rate Last Admin   albuterol (VENTOLIN HFA) 108 (90 Base) MCG/ACT inhaler 2 puff  2 puff Inhalation Q4H PRN Lenard Lance, FNP   2 puff at 01/23/23 0841   alum & mag hydroxide-simeth (MAALOX/MYLANTA) 200-200-20 MG/5ML suspension 30 mL  30 mL Oral Q4H PRN Lenard Lance, FNP       FLUoxetine (PROZAC) capsule 40 mg  40 mg Oral Daily Lenard Lance, FNP       hydrOXYzine (ATARAX) tablet 25 mg  25 mg Oral TID PRN Lenard Lance, FNP       levETIRAcetam (KEPPRA) tablet 500 mg  500 mg Oral Q12H Lenard Lance, FNP   500 mg at 01/22/23 2128   lisinopril (ZESTRIL) tablet 10 mg  10 mg Oral Daily Lenard Lance, FNP       magnesium hydroxide (MILK OF MAGNESIA) suspension 30 mL  30 mL Oral Daily PRN Lenard Lance, FNP       nicotine (NICODERM CQ - dosed in mg/24 hours) patch 14 mg  14 mg Transdermal Daily Lenard Lance, FNP       prazosin (MINIPRESS) capsule 1 mg  1 mg Oral QHS Lenard Lance, FNP   1 mg at 01/22/23 2128   risperiDONE (RISPERDAL) tablet 1 mg  1 mg Oral BID Lenard Lance, FNP  1 mg at 01/22/23 2128   traZODone (DESYREL) tablet 100 mg  100 mg Oral QHS Lenard Lance, FNP   100 mg at 01/22/23 2127   Current Outpatient Medications  Medication Sig Dispense Refill   albuterol (VENTOLIN HFA) 108 (90 Base) MCG/ACT inhaler Inhale 2 puffs into the lungs every 4 (four) hours as needed for wheezing or shortness of breath. 6.7 g 0   FLUoxetine (PROZAC) 40 MG capsule Take 1 capsule (40 mg total) by mouth daily. 30 capsule 0   hydrOXYzine (ATARAX) 50 MG tablet Take 1 tablet (50 mg total) by mouth every 6 (six) hours as needed  for anxiety. 10 tablet 0   levETIRAcetam (KEPPRA) 500 MG tablet Take 1 tablet (500 mg total) by mouth every 12 (twelve) hours. 60 tablet 0   lisinopril (ZESTRIL) 10 MG tablet Take 1 tablet (10 mg total) by mouth daily. 10 tablet 0   nicotine polacrilex (NICORETTE) 4 MG gum Take 1 each (4 mg total) by mouth 4 (four) times daily as needed for smoking cessation. 100 tablet 0   prazosin (MINIPRESS) 1 MG capsule Take 1 capsule (1 mg total) by mouth at bedtime. 30 capsule 0   risperiDONE (RISPERDAL) 2 MG tablet Take 1 tablet (2 mg total) by mouth 2 (two) times daily. 60 tablet 0   traZODone (DESYREL) 100 MG tablet Take 1 tablet (100 mg total) by mouth at bedtime. 30 tablet 0    PTA Medications:  Facility Ordered Medications  Medication   alum & mag hydroxide-simeth (MAALOX/MYLANTA) 200-200-20 MG/5ML suspension 30 mL   magnesium hydroxide (MILK OF MAGNESIA) suspension 30 mL   hydrOXYzine (ATARAX) tablet 25 mg   albuterol (VENTOLIN HFA) 108 (90 Base) MCG/ACT inhaler 2 puff   FLUoxetine (PROZAC) capsule 40 mg   levETIRAcetam (KEPPRA) tablet 500 mg   lisinopril (ZESTRIL) tablet 10 mg   prazosin (MINIPRESS) capsule 1 mg   risperiDONE (RISPERDAL) tablet 1 mg   traZODone (DESYREL) tablet 100 mg   nicotine (NICODERM CQ - dosed in mg/24 hours) patch 14 mg   PTA Medications  Medication Sig   lisinopril (ZESTRIL) 10 MG tablet Take 1 tablet (10 mg total) by mouth daily.   hydrOXYzine (ATARAX) 50 MG tablet Take 1 tablet (50 mg total) by mouth every 6 (six) hours as needed for anxiety.   albuterol (VENTOLIN HFA) 108 (90 Base) MCG/ACT inhaler Inhale 2 puffs into the lungs every 4 (four) hours as needed for wheezing or shortness of breath.   prazosin (MINIPRESS) 1 MG capsule Take 1 capsule (1 mg total) by mouth at bedtime.   FLUoxetine (PROZAC) 40 MG capsule Take 1 capsule (40 mg total) by mouth daily.   risperiDONE (RISPERDAL) 2 MG tablet Take 1 tablet (2 mg total) by mouth 2 (two) times daily.    traZODone (DESYREL) 100 MG tablet Take 1 tablet (100 mg total) by mouth at bedtime.   levETIRAcetam (KEPPRA) 500 MG tablet Take 1 tablet (500 mg total) by mouth every 12 (twelve) hours.   nicotine polacrilex (NICORETTE) 4 MG gum Take 1 each (4 mg total) by mouth 4 (four) times daily as needed for smoking cessation.      Flowsheet Row ED from 01/22/2023 in [facility] ED from 12/10/2022 in [facility] ED from 11/20/2022 in [facility]  C-SSRS RISK CATEGORY High Risk No Risk Low Risk       Musculoskeletal  Strength & Muscle Tone:  within normal limits Gait & Station: normal Patient leans: N/A  Psychiatric Specialty Exam  Presentation  General Appearance:  Appropriate for Environment; Casual  Eye Contact: Fair  Speech: Clear and Coherent; Normal Rate (repetitive hiccup-like sounds when speaking)  Speech Volume: Normal  Handedness: Right   Mood and Affect  Mood: Euthymic  Affect: Depressed   Thought Process  Thought Processes: Coherent; Goal Directed; Linear  Descriptions of Associations:Intact  Orientation:Full (Time, Place and Person)  Thought Content:Logical  Diagnosis of Schizophrenia or Schizoaffective disorder in past: No    Hallucinations:Hallucinations: None  Ideas of Reference:None  Suicidal Thoughts:Suicidal Thoughts: Yes, Active SI Active Intent and/or Plan: With Plan  Homicidal Thoughts:Homicidal Thoughts: No   Sensorium  Memory: Immediate Good; Recent Fair  Judgment: Intact  Insight: Shallow   Executive Functions  Concentration: Good  Attention Span: Good  Recall: Good  Fund of Knowledge: Good  Language: Good   Psychomotor Activity  Psychomotor Activity: Psychomotor Activity: Normal   Assets  Assets: Communication Skills; Desire for Improvement; Financial Resources/Insurance; Housing; Physical Health; Resilience;  Social Support   Sleep  Sleep: Sleep: Poor   Nutritional Assessment (For OBS and FBC admissions only) Has the patient had a weight loss or gain of 10 pounds or more in the last 3 months?: No Has the patient had a decrease in food intake/or appetite?: No Does the patient have dental problems?: No Does the patient have eating habits or behaviors that may be indicators of an eating disorder including binging or inducing vomiting?: No Has the patient recently lost weight without trying?: 0 Has the patient been eating poorly because of a decreased appetite?: 0 Malnutrition Screening Tool Score: 0    Physical Exam  Physical Exam Vitals reviewed.  Constitutional:      Appearance: Normal appearance.  HENT:     Head: Normocephalic and atraumatic.  Eyes:     Extraocular Movements: Extraocular movements intact.     Pupils: Pupils are equal, round, and reactive to light.  Cardiovascular:     Rate and Rhythm: Regular rhythm. Tachycardia present.  Pulmonary:     Breath sounds: Wheezing present.  Musculoskeletal:     Cervical back: Normal range of motion.  Skin:    General: Skin is warm and dry.  Neurological:     General: No focal deficit present.     Mental Status: He is alert.     Review of Systems  Respiratory:  Positive for wheezing.   Psychiatric/Behavioral:  Positive for depression and substance abuse.     Blood pressure 105/77, pulse 87, temperature 98.3 F (36.8 C), temperature source Oral, resp. rate 20, SpO2 99%. There is no height or weight on file to calculate BMI.  Demographic Factors:  Male, Low socioeconomic status, and Living alone  Loss Factors: Decrease in vocational status  Historical Factors: Prior suicide attempts and Impulsivity  Risk Reduction Factors:   Positive therapeutic relationship  Continued Clinical Symptoms:  Depression:   Comorbid alcohol abuse/dependence Impulsivity Severe Alcohol/Substance Abuse/Dependencies  Cognitive Features  That Contribute To Risk:  Thought constriction (tunnel vision)    Suicide Risk:  Severe:  Frequent, intense, and enduring suicidal ideation, specific plan, no subjective intent, but some objective markers of intent (i.e., choice of lethal method), the method is accessible, some limited preparatory behavior, evidence of impaired self-control, severe dysphoria/symptomatology, multiple risk factors present, and few if any protective factors, particularly a lack of social support.  Plan Of Care/Follow-up recommendations:  -Asthma Exacerbation r/t chest tightness, ECG, unremarkable,  no ST changes. Symptoms improved with albuterol. Start prednisone 40 mg daily x 5 days for acute asthma exacerbation. Encouraged patient to asked for rescue inhaler when symptoms are present. -Patient accepted to inpatient treatment at [facility] -Continue current medications  Disposition: Discharge Readmit [facility]  Joaquin Courts, NP-C 01/23/2023, 8:55 AM

## 2023-01-23 NOTE — Group Note (Signed)
Date:  01/23/2023 Time:  5:52 PM  Group Topic/Focus:  Rediscovering Joy:   The focus of this group is to explore various ways to relieve stress in a positive manner. Outdoor Recreation. Music Therapy. Structured Outdoor Activity.    Participation Level:  Active  Participation Quality:  Appropriate and Attentive  Affect:  Appropriate  Cognitive:  Alert, Appropriate, and Oriented  Insight: Appropriate  Engagement in Group:  Engaged and Improving  Modes of Intervention:  Activity, Discussion, and Rapport Building  Additional Comments:    Raymond [name] 01/23/2023, 5:52 PM

## 2023-01-23 NOTE — Tx Team (Signed)
Initial Treatment Plan 01/23/2023 6:58 PM [name] WUJ:[id number]    PATIENT STRESSORS: Medication change or noncompliance   Substance abuse   Other: Loss of Mother three years ago     PATIENT STRENGTHS: Communication skills  Motivation for treatment/growth  Supportive family/friends    PATIENT IDENTIFIED PROBLEMS: SI  Depression  Anxiety  PTSD  Panic attacks             DISCHARGE CRITERIA:  Ability to meet basic life and health needs Improved stabilization in mood, thinking, and/or behavior Need for constant or close observation no longer present Reduction of life-threatening or endangering symptoms to within safe limits  PRELIMINARY DISCHARGE PLAN: Outpatient therapy Return to previous living arrangement  PATIENT/FAMILY INVOLVEMENT: This treatment plan has been presented to and reviewed with the patient, Raymond [name]. The patient has been given the opportunity to ask questions and make suggestions.  Nissan Frazzini, RN 01/23/2023, 6:58 PM

## 2023-01-23 NOTE — Progress Notes (Signed)
Pt was accepted to [facility] TODAY   01/23/2023; Bed Assignment 315 PENDING signed voluntary consent faxed to [phone] or uploaded to pt's chart and notify. Address: [address] [place], [place], [place] [address]  Dx: Mdd recurrent severe without psych features; cocaine use d/o.   Per [facility],Please pre admit this patient.   Pt meets inpatient criteria per Lissa Hoard  Attending Physician will be Dr. Shellee Milo  Report can be called to: [phone]  Pt can arrive after: 0800  Care Team notified: Night [facility] Kim Brooks,RN, Mattew Wiggins,RN, Lavonna Monarch, Gregory Sessler,LPN, Glory Buff, Counselor, Tina Allen,FNP, Bethany Hendra,LCSW, Chinwendu Tull, Day [facility] [address]   [place], [place] 01/23/2023 @ 12:55 AM

## 2023-01-24 DIAGNOSIS — F332 Major depressive disorder, recurrent severe without psychotic features: Secondary | ICD-10-CM

## 2023-01-24 LAB — PROLACTIN: Prolactin: 12 ng/mL (ref 3.9–22.7)

## 2023-01-24 MED ORDER — HYDROXYZINE HCL 50 MG PO TABS: 50.0000 mg | ORAL_TABLET | Freq: Three times a day (TID) | ORAL | Status: DC | PRN

## 2023-01-24 MED ORDER — LEVETIRACETAM 500 MG PO TABS
1000.0000 mg | ORAL_TABLET | Freq: Two times a day (BID) | ORAL | Status: DC
  Administered 2023-01-24 – 2023-01-25 (×2): 1000 mg via ORAL
  Filled 2023-01-24 (×3): qty 2

## 2023-01-24 NOTE — BHH Suicide Risk Assessment (Signed)
[facility] Admission Suicide Risk Assessment   Nursing information obtained from:  Patient Demographic factors:  Male, Low socioeconomic status, Living alone Current Mental Status:  Self-harm thoughts Loss Factors:  Financial problems / change in socioeconomic status Historical Factors:  Prior suicide attempts, Impulsivity Risk Reduction Factors:  Positive social support  Total Time spent with patient: 1 hour Principal Problem: Major depressive disorder, recurrent, severe without psychotic behavior (HCC) Diagnosis:  Principal Problem:   Major depressive disorder, recurrent, severe without psychotic behavior (HCC)  Subjective Data: depression with suicidal ideations after using marijuana laced with cocaine and fentanyl  Continued Clinical Symptoms:  Alcohol Use Disorder Identification Test Final Score (AUDIT): 3 The "Alcohol Use Disorders Identification Test", Guidelines for Use in Primary Care, Second Edition.  World Science writer [facility]). Score between 0-7:  no or low risk or alcohol related problems. Score between 8-15:  moderate risk of alcohol related problems. Score between 16-19:  high risk of alcohol related problems. Score 20 or above:  warrants further diagnostic evaluation for alcohol dependence and treatment.   CLINICAL FACTORS:   Depression:   Severe   Musculoskeletal: Strength & Muscle Tone: within normal limits Gait & Station: normal Patient leans: N/A  Psychiatric Specialty Exam: Physical Exam Vitals and nursing note reviewed.  Constitutional:      Appearance: Normal appearance.  HENT:     Head: Normocephalic.     Nose: Nose normal.  Pulmonary:     Effort: Pulmonary effort is normal.  Musculoskeletal:        General: Normal range of motion.     Cervical back: Normal range of motion.  Neurological:     General: No focal deficit present.     Mental Status: He is alert and oriented to person, place, and time.       Review of Systems   Psychiatric/Behavioral:  Positive for depression and substance abuse. The patient is nervous/anxious.   All other systems reviewed and are negative.    Blood pressure (!) 134/91, pulse (!) 108, temperature 98 F (36.7 C), resp. rate (!) 21, height 6' (1.829 m), weight 76.7 kg, SpO2 97%.Body mass index is 22.92 kg/m.  General Appearance: Casual  Eye Contact:  Fair  Speech:  Normal Rate  Volume:  Normal  Mood:  Anxious and Depressed  Affect:  Non-Congruent  Thought Process:  Coherent  Orientation:  Full (Time, Place, and Person)  Thought Content:  Rumination  Suicidal Thoughts:  No  Homicidal Thoughts:  No  Memory:  Immediate;   Fair Recent;   Fair Remote;   Fair  Judgement:  Fair  Insight:  Fair  Psychomotor Activity:  Normal  Concentration:  Concentration: Fair and Attention Span: Fair  Recall:  [employer] of Knowledge:  Fair  Language:  Good  Akathisia:  No  Handed:  Right  AIMS (if indicated):     Assets:  Housing Leisure Time Resilience Social Support  ADL's:  Intact  Cognition:  WNL  Sleep:       Physical Exam: Physical Exam Vitals and nursing note reviewed.  Constitutional:      Appearance: Normal appearance.  HENT:     Head: Normocephalic.     Nose: Nose normal.  Pulmonary:     Effort: Pulmonary effort is normal.  Musculoskeletal:        General: Normal range of motion.     Cervical back: Normal range of motion.  Neurological:     General: No focal deficit present.  Mental Status: He is alert and oriented to person, place, and time.    Review of Systems  Psychiatric/Behavioral:  Positive for depression, substance abuse and suicidal ideas. The patient is nervous/anxious.   All other systems reviewed and are negative.  Blood pressure (!) 134/91, pulse (!) 108, temperature 98 F (36.7 C), resp. rate (!) 21, height 6' (1.829 m), weight 76.7 kg, SpO2 97%. Body mass index is 22.92 kg/m.   COGNITIVE FEATURES THAT CONTRIBUTE TO RISK:  None     SUICIDE RISK:   Moderate:  Frequent suicidal ideation with limited intensity, and duration, some specificity in terms of plans, no associated intent, good self-control, limited dysphoria/symptomatology, some risk factors present, and identifiable protective factors, including available and accessible social support.  PLAN OF CARE:  Daily contact with patient to assess and evaluate symptoms and progress in treatment, Medication management, and Plan : Major depressive disorder, recurrent, severe without psychosis: Prozac 40 mg daily Risperdal 2 mg BID, EKG normal sinus rhythm with no abnormalities, TSH WDL, ordered lipid panel and A1C  Anxiety: Hydroxyzine 50 mg TID PRN  Insomnia: Trazodone 100 mg daily at bedtime PRN  Nightmares: Prazosin 1 mg daily at bedtime  Seizures: Keppra 1000 mg BID  I certify that inpatient services furnished can reasonably be expected to improve the patient's condition.   Nanine Means, NP 01/24/2023, 10:02 AM
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
## 2023-01-24 NOTE — Progress Notes (Signed)
.  nsn Nursing Shift Note:  1900-0700  Attended Evening Group: yes Medication Compliant:  yes Behavior: anxious, cooperative Sleep Quality: good Significant Changes:  none noted Pt continues to endorse passive SI  01/24/23 0100  Psych Admission Type (Psych Patients Only)  Admission Status Voluntary  Psychosocial Assessment  Patient Complaints Anxiety;Depression  Eye Contact Fair  Facial Expression Anxious  Affect Anxious  Speech Logical/coherent  Interaction Assertive  Motor Activity Fidgety  Appearance/Hygiene In scrubs  Behavior Characteristics Cooperative  Mood Anxious  Thought Process  Coherency Circumstantial  Content Preoccupation  Delusions None reported or observed  Perception WDL  Hallucination None reported or observed  Judgment WDL  Confusion None  Danger to Self  Current suicidal ideation? Passive  Self-Injurious Behavior Some self-injurious ideation observed or expressed.  No lethal plan expressed   Danger to Others  Danger to Others None reported or observed

## 2023-01-24 NOTE — Progress Notes (Signed)
Patient admitted voluntary from [facility] on January 23, 2023 for complaints of depression, panic attacks, PTSD, anxiety, and self reports of possible meth laced marijuana. Diagnosis of major depressive disorder.  Patient presents with an anxious affect and mood. He endorses passive SI. He denies HI/AVH. He endorses anxiety and depression. Adm Hydroxyzine 50 mg at 0850. Upon follow up, patient reports relief. He attended both groups and is appropriate with staff and other patients. Upon finishing sentences, patient has a 'hiccup-like' sound afterward. "That helps me to breathe better."  Q15 minute unit checks in place.
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
## 2023-01-24 NOTE — Group Note (Signed)
Date:  01/24/2023 Time:  12:07 PM  Group Topic/Focus:  Goals Group:   The focus of this group is to help patients establish daily goals to achieve during treatment and discuss how the patient can incorporate goal setting into their daily lives to aide in recovery.    Participation Level:  Active  Participation Quality:  Appropriate  Affect:  Appropriate  Cognitive:  Appropriate  Insight: Appropriate  Engagement in Group:  Engaged  Modes of Intervention:  Activity, Discussion, Education, and Support  Additional Comments:    Wilford Corner 01/24/2023, 12:07 PM

## 2023-01-24 NOTE — Group Note (Signed)
Date:  01/24/2023 Time:  4:11 AM  Group Topic/Focus:  Wrap-Up Group:   The focus of this group is to help patients review their daily goal of treatment and discuss progress on daily workbooks.    Participation Level:  Active  Participation Quality:  Appropriate  Affect:  Appropriate  Cognitive:  Alert and Appropriate  Insight: Appropriate  Engagement in Group:  Limited  Modes of Intervention:  Activity  Additional Comments:     Maglione,Terique Kawabata E 01/24/2023, 4:11 AM

## 2023-01-24 NOTE — Group Note (Signed)
Date:  01/24/2023 Time:  5:53 PM  Group Topic/Focus:  Wellness Toolbox:   The focus of this group is to discuss various aspects of wellness, balancing those aspects and exploring ways to increase the ability to experience wellness.  Patients will create a wellness toolbox for use upon discharge.    Participation Level:  Active  Participation Quality:  Appropriate  Affect:  Appropriate  Cognitive:  Appropriate  Insight: Appropriate  Engagement in Group:  Engaged  Modes of Intervention:  Activity, Discussion, and Socialization  Additional Comments:    Wilford Corner 01/24/2023, 5:53 PM

## 2023-01-24 NOTE — Group Note (Signed)
LCSW Group Therapy Note   Group Date: 01/24/2023 Start Time: 1310 End Time: 1410   Type of Therapy and Topic:  Group Therapy: 8 Dimensions of Wellness  Participation Level:  Active      Summary of Patient Progress:  The patient attended group. Patient proved open to input from peers and feedback from [facility]. Patient demonstrated insight into the subject matter, was respectful of peers, and participated throughout the entire session. The patient participated during the icebreaker discussion. The patient stated that emotional wellness is something he needs to work and focus on. The patient stated that in order for the other dimensions to be okay he feels that emotional should be worked on first.     Marshell Levan, Theresia Majors 01/24/2023  2:49 PM

## 2023-01-24 NOTE — Group Note (Signed)
Date:  01/24/2023 Time:  10:44 PM  Group Topic/Focus:  Wrap-Up Group:   The focus of this group is to help patients review their daily goal of treatment and discuss progress on daily workbooks.    Participation Level:  Active  Participation Quality:  Appropriate, Attentive, and Monopolizing  Affect:  Appropriate  Cognitive:  Alert and Lacking  Insight: Lacking  Engagement in Group:  Defensive and Monopolizing  Modes of Intervention:  Activity, Discussion, and Socialization  Additional Comments:     Katina Dung 01/24/2023, 10:44 PM

## 2023-01-24 NOTE — Plan of Care (Signed)
  Problem: Coping: Goal: Ability to verbalize frustrations and anger appropriately will improve Outcome: Progressing Goal: Ability to demonstrate self-control will improve Outcome: Progressing   Problem: Health Behavior/Discharge Planning: Goal: Identification of resources available to assist in meeting health care needs will improve Outcome: Progressing   

## 2023-01-25 DIAGNOSIS — F332 Major depressive disorder, recurrent severe without psychotic features: Secondary | ICD-10-CM | POA: Diagnosis not present

## 2023-01-25 MED ORDER — LEVETIRACETAM 750 MG PO TABS
750.0000 mg | ORAL_TABLET | Freq: Two times a day (BID) | ORAL | Status: DC
  Administered 2023-01-25 – 2023-01-29 (×8): 750 mg via ORAL
  Filled 2023-01-25 (×8): qty 1

## 2023-01-25 NOTE — Progress Notes (Signed)
D- Patient alert and oriented x 4. Affect anxious and animated/mood anxious. Denies SI/ HI/ AVH. Patient denies pain at present. Patient endorses depression and anxiety. States his goal today is to "focus and pray". A- Scheduled medications administered to patient, per MD orders. Support and encouragement provided.  Routine safety checks conducted every 15 minutes without incident.  Patient informed to notify staff with problems or concerns and verbalizes understanding. R- No adverse drug reactions noted. Patient compliant with medications and treatment plan. Patient receptive, calm, cooperative and interacts well with others on the unit.  Patient contracts for safety and  remains safe on the unit at this time.

## 2023-01-25 NOTE — Group Note (Signed)
Date:  01/25/2023 Time:  10:14 PM  Group Topic/Focus:  Wrap-Up Group:   The focus of this group is to help patients review their daily goal of treatment and discuss progress on daily workbooks.    Participation Level:  Active  Participation Quality:  Appropriate and Monopolizing  Affect:  Excited  Cognitive:  Alert and Appropriate  Insight: Appropriate and Limited  Engagement in Group:  Engaged, Monopolizing, and Supportive  Modes of Intervention:  Activity, Discussion, and Socialization  Additional Comments:  Pt very verbal with other patients. Pt talks loudly over some others conversation however is easily redirectable and quick to apologize   Maglione,Cecilee Rosner E 01/25/2023, 10:14 PM

## 2023-01-25 NOTE — Group Note (Signed)
Date:  01/25/2023 Time:  7:25 PM  Group Topic/Focus:  Activity Group    Participation Level:  Active  Participation Quality:  Appropriate  Affect:  Appropriate  Cognitive:  Appropriate  Insight: Appropriate  Engagement in Group:  Engaged  Modes of Intervention:  Activity  Additional Comments:    Liyah Higham A Numan Zylstra 01/25/2023, 7:25 PM

## 2023-01-25 NOTE — [facility] Suicide Risk Assessment (Signed)
[facility] INPATIENT:  Family/Significant Other Suicide Prevention Education  Suicide Prevention Education:  Contact Attempts: Ti Pelkey, brother, [phone],  has been identified by the patient as the family member/significant other with whom the patient will be residing, and identified as the person(s) who will aid the patient in the event of a mental health crisis.  With written consent from the patient, two attempts were made to provide suicide prevention education, prior to and/or following the patient's discharge.  We were unsuccessful in providing suicide prevention education.  A suicide education pamphlet was given to the patient to share with family/significant other.  Date and time of first attempt: 01/25/23 at 2:38 pm Date and time of second attempt: Second attempt needed  The LCSWA attempted to contact the patients brother but there was no answer.   Marshell Levan 01/25/2023, 2:38 PM

## 2023-01-25 NOTE — Progress Notes (Signed)
Nursing Shift Note:  1900-0700  Attended Evening Group: yes Medication Compliant: yes  Behavior: cooperative and social Sleep Quality: good Significant Changes: none  The patient socialized and watched television.  His mood was euthymic and affect was in sync.  [place] stated that he enjoys the care provided in the BMU.  No concerns to note.

## 2023-01-25 NOTE — Progress Notes (Addendum)
[facility] MD Progress Note  01/25/2023 10:57 AM Raymond [name]  MRN:  [id number]  Subjective:   42 yo male admitted for an increase in depression after using marijuana with cocaine and meth.  Notes, labs, and vital signs reviewed prior to the assessment.  The client was interacting with peers appropriately in the dayroom prior to the evaluation.  He stated his depression was 5/10 with no suicidal ideations with anxiety being lower, "My depression is worse than my anxiety."  He did have some nausea last night and this morning which he contributes to the recent increase in his Keppra after his disability meeting, requested it to be decreased to prevent these side effects.  His last seizure was related to his substance abuse.  He wants to discharge on Tuesday to the community center where he [facility].  Principal Problem: Major depressive disorder, recurrent, severe without psychotic behavior (HCC) Diagnosis: Principal Problem:   Major depressive disorder, recurrent, severe without psychotic behavior (HCC)  Total Time spent with patient: 30 minutes  Past Psychiatric History: depression and anxiety, substance use d/o  Past Medical History:  Past Medical History:  Diagnosis Date   Anxiety    Hepatitis C    PTSD (post-traumatic stress disorder)     Past Surgical History:  Procedure Laterality Date   FACIAL FRACTURE SURGERY     metal plate under right eye   Family History: History reviewed. No pertinent family history. Family Psychiatric  History: none Social History:  Social History   Substance and Sexual Activity  Alcohol Use Not Currently     Social History   Substance and Sexual Activity  Drug Use Not Currently    Social History   Socioeconomic History   Marital status: Single    Spouse name: Not on file   Number of children: Not on file   Years of education: Not on file   Highest education level: Not on file  Occupational History   Not on file  Tobacco Use   Smoking status:  Some Days    Types: Cigarettes   Smokeless tobacco: Never  Vaping Use   Vaping status: Never Used  Substance and Sexual Activity   Alcohol use: Not Currently   Drug use: Not Currently   Sexual activity: Not on file  Other Topics Concern   Not on file  Social History Narrative   Not on file   Social Determinants of Health   Financial Resource Strain: Not on File (08/18/2022)   Received from [employer]    Financial Resource Strain: 0  Food Insecurity: Food Insecurity Present (01/23/2023)   Hunger Vital Sign    Worried About Running Out of Food in the Last Year: Often true    Ran Out of Food in the Last Year: Often true  Transportation Needs: Unmet Transportation Needs (01/23/2023)   PRAPARE - Administrator, Civil Service (Medical): Yes    Lack of Transportation (Non-Medical): Yes  Physical Activity: Not on File (08/18/2022)   Received from [place], [place]   Physical Activity    Physical Activity: 0  Stress: Not on File (08/18/2022)   Received from [facility], [place]   Stress    Stress: 0  Social Connections: Not on File (08/18/2022)   Received from [place], [place]   Social Connections    Social Connections and Isolation: 0   Additional Social History: lives alone and has a place to live   Sleep: Fair  Appetite:  Fair  Current Medications: Current Facility-Administered Medications  Medication Dose Route Frequency Provider Last Rate Last Admin   acetaminophen (TYLENOL) tablet 650 mg  650 mg Oral Q6H PRN [name], NP       albuterol (PROVENTIL) (2.5 MG/3ML) 0.083% nebulizer solution 3 mL  3 mL Inhalation Q4H PRN Charm Rings, NP       alum & mag hydroxide-simeth (MAALOX/MYLANTA) 200-200-20 MG/5ML suspension 30 mL  30 mL Oral Q4H PRN [name], NP       FLUoxetine (PROZAC) capsule 40 mg  40 mg Oral Daily Charm Rings, NP   40 mg at 01/25/23 2952   hydrOXYzine (ATARAX) tablet 50 mg  50 mg Oral TID PRN Charm Rings, NP        levETIRAcetam (KEPPRA) tablet 1,000 mg  1,000 mg Oral Q12H Charm Rings, NP   1,000 mg at 01/25/23 8413   lisinopril (ZESTRIL) tablet 10 mg  10 mg Oral Daily Charm Rings, NP   10 mg at 01/25/23 2440   magnesium hydroxide (MILK OF MAGNESIA) suspension 30 mL  30 mL Oral Daily PRN [name], NP       nicotine polacrilex (NICORETTE) gum 4 mg  4 mg Oral QID PRN Charm Rings, NP   4 mg at 01/24/23 1748   prazosin (MINIPRESS) capsule 1 mg  1 mg Oral QHS Charm Rings, NP   1 mg at 01/24/23 2206   risperiDONE (RISPERDAL) tablet 2 mg  2 mg Oral BID Charm Rings, NP   2 mg at 01/25/23 1027   traZODone (DESYREL) tablet 100 mg  100 mg Oral QHS Charm Rings, NP   100 mg at 01/24/23 2207    Lab Results: No results found for this or any previous visit (from the past 48 hour(s)).  Blood Alcohol level:  Lab Results  Component Value Date   ETH <10 01/22/2023   ETH <10 11/20/2022    Metabolic Disorder Labs: Lab Results  Component Value Date   HGBA1C 6.1 (H) 10/29/2022   MPG 128 10/29/2022   Lab Results  Component Value Date   PROLACTIN 12.0 01/22/2023   PROLACTIN 19.5 11/20/2022   Lab Results  Component Value Date   CHOL 210 (H) 10/29/2022   TRIG 337 (H) 10/29/2022   HDL 50 10/29/2022   CHOLHDL 4.2 10/29/2022   VLDL 67 (H) 10/29/2022   LDLCALC 93 10/29/2022    Physical Findings: AIMS:  , ,  ,  ,    CIWA:    COWS:     Musculoskeletal: Strength & Muscle Tone: within normal limits Gait & Station: normal Patient leans: N/A  Psychiatric Specialty Exam: Physical Exam Vitals and nursing note reviewed.  Constitutional:      Appearance: Normal appearance.  HENT:     Head: Normocephalic.     Nose: Nose normal.  Pulmonary:     Effort: Pulmonary effort is normal.  Musculoskeletal:        General: Normal range of motion.     Cervical back: Normal range of motion.  Neurological:     General: No focal deficit present.     Mental Status: He is alert and oriented to  person, place, and time.     Review of Systems  Gastrointestinal:  Positive for nausea.  Psychiatric/Behavioral:  Positive for depression. The patient is nervous/anxious.   All other systems reviewed and are negative.   Blood pressure (!) 126/98, pulse 95, temperature 98.2 F (36.8 C), resp.  rate 18, height 6' (1.829 m), weight 76.7 kg, SpO2 97%.Body mass index is 22.92 kg/m.  General Appearance: Casual  Eye Contact:  Good  Speech:  Clear and Coherent  Volume:  Normal  Mood:  Anxious and Depressed  Affect:  Congruent  Thought Process:  Coherent  Orientation:  Full (Time, Place, and Person)  Thought Content:  Logical  Suicidal Thoughts:  No  Homicidal Thoughts:  No  Memory:  Immediate;   Fair Recent;   Fair Remote;   Fair  Judgement:  Fair  Insight:  Fair  Psychomotor Activity:  Normal  Concentration:  Concentration: Fair and Attention Span: Fair  Recall:  [employer] of Knowledge:  Fair  Language:  Good  Akathisia:  No  Handed:  Right  AIMS (if indicated):     Assets:  Housing Leisure Time Physical Health Resilience Social Support  ADL's:  Intact  Cognition:  WNL  Sleep:         Physical Exam: Physical Exam Vitals and nursing note reviewed.  Constitutional:      Appearance: Normal appearance.  HENT:     Head: Normocephalic.     Nose: Nose normal.  Pulmonary:     Effort: Pulmonary effort is normal.  Musculoskeletal:        General: Normal range of motion.     Cervical back: Normal range of motion.  Neurological:     General: No focal deficit present.     Mental Status: He is alert and oriented to person, place, and time.    Review of Systems  Gastrointestinal:  Positive for nausea.  Psychiatric/Behavioral:  Positive for depression. The patient is nervous/anxious.   All other systems reviewed and are negative.  Blood pressure (!) 126/98, pulse 95, temperature 98.2 F (36.8 C), resp. rate 18, height 6' (1.829 m), weight 76.7 kg, SpO2 97%. Body mass  index is 22.92 kg/m.  Treatment Plan Summary: Daily contact with patient to assess and evaluate symptoms and progress in treatment, Medication management, and Plan : Major depressive disorder, recurrent, severe without psychosis: Prozac 40 mg daily Risperdal 2 mg BID, EKG normal sinus rhythm with no abnormalities, TSH WDL, ordered lipid panel and A1C   Anxiety: Hydroxyzine 50 mg TID PRN   Insomnia: Trazodone 100 mg daily at bedtime PRN   Nightmares: Prazosin 1 mg daily at bedtime   Seizures: Keppra 1000 mg BID decreased to 750 mg BID   Nanine Means, NP 01/25/2023, 10:57 AM

## 2023-01-25 NOTE — Progress Notes (Signed)
Nursing Shift Note:  1900-0700  Attended Evening Group: Yes Medication Compliant:  Yes Behavior: disorganized; cooperative Sleep Quality: good Significant Changes: None   01/25/23 0300  Psych Admission Type (Psych Patients Only)  Admission Status Voluntary  Psychosocial Assessment  Patient Complaints Anxiety;Depression  Eye Contact Fair  Facial Expression Anxious  Affect Anxious  Speech Logical/coherent  Interaction Assertive  Motor Activity Restless  Appearance/Hygiene In scrubs  Behavior Characteristics Cooperative  Mood Anxious  Thought Process  Coherency Circumstantial  Content Preoccupation  Delusions None reported or observed  Perception WDL  Hallucination None reported or observed  Judgment WDL  Confusion None  Danger to Self  Current suicidal ideation? Passive  Danger to Others  Danger to Others None reported or observed

## 2023-01-26 DIAGNOSIS — F332 Major depressive disorder, recurrent severe without psychotic features: Secondary | ICD-10-CM | POA: Diagnosis not present

## 2023-01-26 LAB — LIPID PANEL
Cholesterol: 157 mg/dL (ref 0–200)
HDL: 39 mg/dL — ABNORMAL LOW (ref >40)
LDL Cholesterol: 56 mg/dL (ref 0–99)
Total CHOL/HDL Ratio: 4 ratio
Triglycerides: 309 mg/dL — ABNORMAL HIGH (ref <150)
VLDL: 62 mg/dL — ABNORMAL HIGH (ref 0–40)

## 2023-01-26 LAB — HEMOGLOBIN A1C
Hgb A1c MFr Bld: 5.5 % (ref 4.8–5.6)
Mean Plasma Glucose: 111.15 mg/dL

## 2023-01-26 NOTE — Plan of Care (Signed)
Pt compliant with medication administration per MD orders, and procedures on the unit. Pt denies SI/HI/AVH  Problem: Education: Goal: Knowledge of General Education information will improve Description: Including pain rating scale, medication(s)/side effects and non-pharmacologic comfort measures Outcome: Progressing   Problem: Health Behavior/Discharge Planning: Goal: Ability to manage health-related needs will improve Outcome: Progressing   Problem: Clinical Measurements: Goal: Ability to maintain clinical measurements within normal limits will improve Outcome: Progressing Goal: Will remain free from infection Outcome: Progressing Goal: Diagnostic test results will improve Outcome: Progressing Goal: Respiratory complications will improve Outcome: Progressing Goal: Cardiovascular complication will be avoided Outcome: Progressing   Problem: Activity: Goal: Risk for activity intolerance will decrease Outcome: Progressing   Problem: Nutrition: Goal: Adequate nutrition will be maintained Outcome: Progressing   Problem: Coping: Goal: Level of anxiety will decrease Outcome: Progressing   Problem: Elimination: Goal: Will not experience complications related to bowel motility Outcome: Progressing Goal: Will not experience complications related to urinary retention Outcome: Progressing   Problem: Pain Managment: Goal: General experience of comfort will improve Outcome: Progressing   Problem: Safety: Goal: Ability to remain free from injury will improve Outcome: Progressing   Problem: Skin Integrity: Goal: Risk for impaired skin integrity will decrease Outcome: Progressing   Problem: Education: Goal: Knowledge of [facility] Education information/materials will improve Outcome: Progressing Goal: Emotional status will improve Outcome: Progressing Goal: Mental status will improve Outcome: Progressing Goal: Verbalization of understanding the information  provided will improve Outcome: Progressing   Problem: Activity: Goal: Interest or engagement in activities will improve Outcome: Progressing Goal: Sleeping patterns will improve Outcome: Progressing   Problem: Coping: Goal: Ability to verbalize frustrations and anger appropriately will improve Outcome: Progressing Goal: Ability to demonstrate self-control will improve Outcome: Progressing   Problem: Health Behavior/Discharge Planning: Goal: Identification of resources available to assist in meeting health care needs will improve Outcome: Progressing Goal: Compliance with treatment plan for underlying cause of condition will improve Outcome: Progressing   Problem: Physical Regulation: Goal: Ability to maintain clinical measurements within normal limits will improve Outcome: Progressing   Problem: Safety: Goal: Periods of time without injury will increase Outcome: Progressing   Problem: Self-Concept: Goal: Ability to identify factors that promote anxiety will improve Outcome: Progressing Goal: Level of anxiety will decrease Outcome: Progressing Goal: Ability to modify response to factors that promote anxiety will improve Outcome: Progressing   Problem: Coping: Goal: Coping ability will improve Outcome: Progressing Goal: Will verbalize feelings Outcome: Progressing   Problem: Coping: Goal: Coping ability will improve Outcome: Progressing   Problem: Health Behavior/Discharge Planning: Goal: Identification of resources available to assist in meeting health care needs will improve Outcome: Progressing

## 2023-01-26 NOTE — BH IP Treatment Plan (Signed)
Interdisciplinary Treatment and Diagnostic Plan Update  01/26/2023 Time of Session: 9:42 AM  Raymond [name] MRN: [id number]  Principal Diagnosis: Major depressive disorder, recurrent, severe without psychotic behavior (HCC)  Secondary Diagnoses: Principal Problem:   Major depressive disorder, recurrent, severe without psychotic behavior (HCC)   Current Medications:  Current Facility-Administered Medications  Medication Dose Route Frequency Provider Last Rate Last Admin   acetaminophen (TYLENOL) tablet 650 mg  650 mg Oral Q6H PRN [name], NP       albuterol (PROVENTIL) (2.5 MG/3ML) 0.083% nebulizer solution 3 mL  3 mL Inhalation Q4H PRN Charm Rings, NP       alum & mag hydroxide-simeth (MAALOX/MYLANTA) 200-200-20 MG/5ML suspension 30 mL  30 mL Oral Q4H PRN [name], NP       FLUoxetine (PROZAC) capsule 40 mg  40 mg Oral Daily Charm Rings, NP   40 mg at 01/26/23 6213   hydrOXYzine (ATARAX) tablet 50 mg  50 mg Oral TID PRN Charm Rings, NP       levETIRAcetam (KEPPRA) tablet 750 mg  750 mg Oral Q12H Charm Rings, NP   750 mg at 01/26/23 0939   lisinopril (ZESTRIL) tablet 10 mg  10 mg Oral Daily Charm Rings, NP   10 mg at 01/26/23 0865   magnesium hydroxide (MILK OF MAGNESIA) suspension 30 mL  30 mL Oral Daily PRN [name], NP       nicotine polacrilex (NICORETTE) gum 4 mg  4 mg Oral QID PRN Charm Rings, NP   4 mg at 01/24/23 1748   prazosin (MINIPRESS) capsule 1 mg  1 mg Oral QHS Charm Rings, NP   1 mg at 01/25/23 2112   risperiDONE (RISPERDAL) tablet 2 mg  2 mg Oral BID Charm Rings, NP   2 mg at 01/26/23 0940   traZODone (DESYREL) tablet 100 mg  100 mg Oral QHS Charm Rings, NP   100 mg at 01/25/23 2112   PTA Medications: Medications Prior to Admission  Medication Sig Dispense Refill Last Dose   albuterol (VENTOLIN HFA) 108 (90 Base) MCG/ACT inhaler Inhale 2 puffs into the lungs every 4 (four) hours as needed for wheezing or shortness of  breath. 6.7 g 0    FLUoxetine (PROZAC) 40 MG capsule Take 1 capsule (40 mg total) by mouth daily. 30 capsule 0    hydrOXYzine (ATARAX) 50 MG tablet Take 1 tablet (50 mg total) by mouth every 6 (six) hours as needed for anxiety. 10 tablet 0    levETIRAcetam (KEPPRA) 500 MG tablet Take 1 tablet (500 mg total) by mouth every 12 (twelve) hours. 60 tablet 0    lisinopril (ZESTRIL) 10 MG tablet Take 1 tablet (10 mg total) by mouth daily. 10 tablet 0    nicotine polacrilex (NICORETTE) 4 MG gum Take 1 each (4 mg total) by mouth 4 (four) times daily as needed for smoking cessation. 100 tablet 0    prazosin (MINIPRESS) 1 MG capsule Take 1 capsule (1 mg total) by mouth at bedtime. 30 capsule 0    risperiDONE (RISPERDAL) 2 MG tablet Take 1 tablet (2 mg total) by mouth 2 (two) times daily. 60 tablet 0    traZODone (DESYREL) 100 MG tablet Take 1 tablet (100 mg total) by mouth at bedtime. 30 tablet 0     Patient Stressors: Medication change or noncompliance   Substance abuse   Other: Loss of Mother three years ago    Patient Strengths:  Communication skills  Motivation for treatment/growth  Supportive family/friends   Treatment Modalities: Medication Management, Group therapy, Case management,  1 to 1 session with clinician, Psychoeducation, Recreational therapy.   Physician Treatment Plan for Primary Diagnosis: Major depressive disorder, recurrent, severe without psychotic behavior (HCC) Long Term Goal(s): Improvement in symptoms so as ready for discharge   Short Term Goals: Ability to identify changes in lifestyle to reduce recurrence of condition will improve Ability to verbalize feelings will improve Ability to disclose and discuss suicidal ideas Ability to demonstrate self-control will improve Ability to identify and develop effective coping behaviors will improve Ability to maintain clinical measurements within normal limits will improve Compliance with prescribed medications will  improve Ability to identify triggers associated with substance abuse/mental health issues will improve  Medication Management: Evaluate patient's response, side effects, and tolerance of medication regimen.  Therapeutic Interventions: 1 to 1 sessions, Unit Group sessions and Medication administration.  Evaluation of Outcomes: Progressing  Physician Treatment Plan for Secondary Diagnosis: Principal Problem:   Major depressive disorder, recurrent, severe without psychotic behavior (HCC)  Long Term Goal(s): Improvement in symptoms so as ready for discharge   Short Term Goals: Ability to identify changes in lifestyle to reduce recurrence of condition will improve Ability to verbalize feelings will improve Ability to disclose and discuss suicidal ideas Ability to demonstrate self-control will improve Ability to identify and develop effective coping behaviors will improve Ability to maintain clinical measurements within normal limits will improve Compliance with prescribed medications will improve Ability to identify triggers associated with substance abuse/mental health issues will improve     Medication Management: Evaluate patient's response, side effects, and tolerance of medication regimen.  Therapeutic Interventions: 1 to 1 sessions, Unit Group sessions and Medication administration.  Evaluation of Outcomes: Progressing   RN Treatment Plan for Primary Diagnosis: Major depressive disorder, recurrent, severe without psychotic behavior (HCC) Long Term Goal(s): Knowledge of disease and therapeutic regimen to maintain health will improve  Short Term Goals: Ability to remain free from injury will improve, Ability to verbalize frustration and anger appropriately will improve, Ability to demonstrate self-control, Ability to participate in decision making will improve, Ability to verbalize feelings will improve, Ability to disclose and discuss suicidal ideas, Ability to identify and develop  effective coping behaviors will improve, and Compliance with prescribed medications will improve  Medication Management: RN will administer medications as ordered by provider, will assess and evaluate patient's response and provide education to patient for prescribed medication. RN will report any adverse and/or side effects to prescribing provider.  Therapeutic Interventions: 1 on 1 counseling sessions, Psychoeducation, Medication administration, Evaluate responses to treatment, Monitor vital signs and CBGs as ordered, Perform/monitor CIWA, COWS, AIMS and Fall Risk screenings as ordered, Perform wound care treatments as ordered.  Evaluation of Outcomes: Progressing   LCSW Treatment Plan for Primary Diagnosis: Major depressive disorder, recurrent, severe without psychotic behavior (HCC) Long Term Goal(s): Safe transition to appropriate next level of care at discharge, Engage patient in therapeutic group addressing interpersonal concerns.  Short Term Goals: Engage patient in aftercare planning with referrals and resources, Increase social support, Increase ability to appropriately verbalize feelings, Increase emotional regulation, Facilitate acceptance of mental health diagnosis and concerns, and Increase skills for wellness and recovery  Therapeutic Interventions: Assess for all discharge needs, 1 to 1 time with Social worker, Explore available resources and support systems, Assess for adequacy in community support network, Educate family and significant other(s) on suicide prevention, Complete Psychosocial Assessment, Interpersonal group therapy.  Evaluation of Outcomes: Progressing   Progress in Treatment: Attending groups: Yes. Participating in groups: Yes. Taking medication as prescribed: Yes. Toleration medication: Yes. Family/Significant other contact made: No, will contact:  CSW will contact if given permission  Patient understands diagnosis: Yes. Discussing patient identified  problems/goals with staff: Yes. Medical problems stabilized or resolved: Yes. Denies suicidal/homicidal ideation: Yes. Issues/concerns per patient self-inventory: No. Other: None   New problem(s) identified: No, Describe:  None   New Short Term/Long Term Goal(s): elimination of symptoms of psychosis, medication management for mood stabilization; elimination of SI thoughts; development of comprehensive mental wellness plan.   Patient Goals:  "It's hard to say. I can be here and everything goes smooth, but when I get out I don't get the type of help I get here"  Discharge Plan or Barriers: CSW will assist with appropriate discharge planning   Reason for Continuation of Hospitalization: Depression Medication stabilization  Estimated Length of Stay: 1 to 7 days   Last 3 [place] Suicide Severity Risk Score: Flowsheet Row Admission (Current) from 01/23/2023 in [facility] INPAT[facility]ICINE ED from 01/22/2023 in [facility] ED from 12/10/2022 in [facility] Emergency Department at [facility]  C-SSRS RISK CATEGORY High Risk High Risk No Risk       Last [facility] 2/9 Scores:     No data to display          Scribe for Treatment Team: Elza Rafter, Theresia Majors 01/26/2023 10:19 AM

## 2023-01-26 NOTE — Group Note (Signed)
Date:  01/26/2023 Time:  12:25 PM  Group Topic/Focus:  Healthy Communication:   The focus of this group is to discuss communication, barriers to communication, as well as healthy ways to communicate with others.    Participation Level:  Active  Participation Quality:  Appropriate, Attentive, Sharing, and Supportive  Affect:  Appropriate  Cognitive:  Alert, Appropriate, and Oriented  Insight: Appropriate  Engagement in Group:  Developing/Improving  Modes of Intervention:  Activity, Rapport Building, and Socialization  Additional Comments:    Rosaura Carpenter 01/26/2023, 12:25 PM

## 2023-01-26 NOTE — BHH Counselor (Signed)
CSW contacted the [facility] in an effort to identify the services patient is currently receiving.   Call was transferred to patient's case manager, Seward Speck, [phone].  CSW was informed that patient currently receives a housing voucher for [employer], has set  up with a PCP, full medical benefits, has MH scheduled for 02/19/2023 at 10:30, and a neurology appointment for 01/28/2023 at 8AM.  CSW inquired if patient could receive an ACT like service at the [facility].  CSW was informed that [facility] NOT [facility] C[facility] or [facility] services, however, CSW can make referrals and this will not interfere with his [facility] benefits.    CSW was informed that the case manager went to patients home on 01/20/2023 to help clean.  CSW reviewed with case manager the reported suicide attempts on 8/21, 8/17-8/19/2024.  Case manager was unaware.   Case manager reports that he is attempting to link the patient with [facility].  He inquired if hospital had any documentation of a TBI to assist patient in getting linked with services.  CSW reported that she was unsure, however, will follow up once asked.  Penni Homans, MSW, LCSW 01/26/2023 2:23 PM

## 2023-01-26 NOTE — Group Note (Signed)
Date:  01/26/2023 Time:  6:05 PM  Group Topic/Focus:  Art Therapy It is particularly useful for those who are struggling with depression, anxiety, trauma, or other related mental health issues. Individuals who need help expressing their emotions or exploring unresolved issues may also benefit from art therapy.    Participation Level:  Active  Participation Quality:  Appropriate and Attentive  Affect:  Appropriate  Cognitive:  Alert, Appropriate, and Oriented  Insight: Appropriate  Engagement in Group:  Developing/Improving  Modes of Intervention:  Activity, Exploration, and Socialization  Additional Comments:    Rosaura Carpenter 01/26/2023, 6:05 PM

## 2023-01-26 NOTE — Progress Notes (Signed)
Pt calm and pleasant during assessment denying SI/HI/AVH. Pt observed interacting appropriately with peers and staff on the unit. Pt endorses anxiety and depression. Pt compliant with medication administration per MD orders. Pt give education, support, and encouragement to be active in his treatment plan. Pt being monitored Q 15 minutes for safety per unit protocol, remains safe on the unit

## 2023-01-26 NOTE — Group Note (Signed)
Date:  01/26/2023 Time:  9:37 PM  Group Topic/Focus:  Wrap-Up Group:   The focus of this group is to help patients review their daily goal of treatment and discuss progress on daily workbooks.    Participation Level:  Active  Participation Quality:  Appropriate  Affect:  Appropriate  Cognitive:  Appropriate  Insight: Good  Engagement in Group:  Engaged  Modes of Intervention:  Discussion   Lenore Cordia 01/26/2023, 9:37 PM

## 2023-01-26 NOTE — BH IP Treatment Plan (Signed)
Interdisciplinary Treatment and Diagnostic Plan Update  01/26/2023 Time of Session: 09:42 Raymond [name] MRN: [id number]  Principal Diagnosis: Major depressive disorder, recurrent, severe without psychotic behavior (HCC)  Secondary Diagnoses: Principal Problem:   Major depressive disorder, recurrent, severe without psychotic behavior (HCC)   Current Medications:  Current Facility-Administered Medications  Medication Dose Route Frequency Provider Last Rate Last Admin   acetaminophen (TYLENOL) tablet 650 mg  650 mg Oral Q6H PRN [name], NP       albuterol (PROVENTIL) (2.5 MG/3ML) 0.083% nebulizer solution 3 mL  3 mL Inhalation Q4H PRN Charm Rings, NP       alum & mag hydroxide-simeth (MAALOX/MYLANTA) 200-200-20 MG/5ML suspension 30 mL  30 mL Oral Q4H PRN [name], NP       FLUoxetine (PROZAC) capsule 40 mg  40 mg Oral Daily Charm Rings, NP   40 mg at 01/26/23 7846   hydrOXYzine (ATARAX) tablet 50 mg  50 mg Oral TID PRN Charm Rings, NP       levETIRAcetam (KEPPRA) tablet 750 mg  750 mg Oral Q12H Charm Rings, NP   750 mg at 01/26/23 0939   lisinopril (ZESTRIL) tablet 10 mg  10 mg Oral Daily Charm Rings, NP   10 mg at 01/26/23 9629   magnesium hydroxide (MILK OF MAGNESIA) suspension 30 mL  30 mL Oral Daily PRN [name], NP       nicotine polacrilex (NICORETTE) gum 4 mg  4 mg Oral QID PRN Charm Rings, NP   4 mg at 01/24/23 1748   prazosin (MINIPRESS) capsule 1 mg  1 mg Oral QHS Charm Rings, NP   1 mg at 01/25/23 2112   risperiDONE (RISPERDAL) tablet 2 mg  2 mg Oral BID Charm Rings, NP   2 mg at 01/26/23 0940   traZODone (DESYREL) tablet 100 mg  100 mg Oral QHS Charm Rings, NP   100 mg at 01/25/23 2112   PTA Medications: Medications Prior to Admission  Medication Sig Dispense Refill Last Dose   albuterol (VENTOLIN HFA) 108 (90 Base) MCG/ACT inhaler Inhale 2 puffs into the lungs every 4 (four) hours as needed for wheezing or shortness of breath.  6.7 g 0    FLUoxetine (PROZAC) 40 MG capsule Take 1 capsule (40 mg total) by mouth daily. 30 capsule 0    hydrOXYzine (ATARAX) 50 MG tablet Take 1 tablet (50 mg total) by mouth every 6 (six) hours as needed for anxiety. 10 tablet 0    levETIRAcetam (KEPPRA) 500 MG tablet Take 1 tablet (500 mg total) by mouth every 12 (twelve) hours. 60 tablet 0    lisinopril (ZESTRIL) 10 MG tablet Take 1 tablet (10 mg total) by mouth daily. 10 tablet 0    nicotine polacrilex (NICORETTE) 4 MG gum Take 1 each (4 mg total) by mouth 4 (four) times daily as needed for smoking cessation. 100 tablet 0    prazosin (MINIPRESS) 1 MG capsule Take 1 capsule (1 mg total) by mouth at bedtime. 30 capsule 0    risperiDONE (RISPERDAL) 2 MG tablet Take 1 tablet (2 mg total) by mouth 2 (two) times daily. 60 tablet 0    traZODone (DESYREL) 100 MG tablet Take 1 tablet (100 mg total) by mouth at bedtime. 30 tablet 0     Patient Stressors: Medication change or noncompliance   Substance abuse   Other: Loss of Mother three years ago    Patient Strengths: Manufacturing systems engineer  Motivation for treatment/growth  Supportive family/friends   Treatment Modalities: Medication Management, Group therapy, Case management,  1 to 1 session with clinician, Psychoeducation, Recreational therapy.   Physician Treatment Plan for Primary Diagnosis: Major depressive disorder, recurrent, severe without psychotic behavior (HCC) Long Term Goal(s): Improvement in symptoms so as ready for discharge   Short Term Goals: Ability to identify changes in lifestyle to reduce recurrence of condition will improve Ability to verbalize feelings will improve Ability to disclose and discuss suicidal ideas Ability to demonstrate self-control will improve Ability to identify and develop effective coping behaviors will improve Ability to maintain clinical measurements within normal limits will improve Compliance with prescribed medications will improve Ability to  identify triggers associated with substance abuse/mental health issues will improve  Medication Management: Evaluate patient's response, side effects, and tolerance of medication regimen.  Therapeutic Interventions: 1 to 1 sessions, Unit Group sessions and Medication administration.  Evaluation of Outcomes: Progressing  Physician Treatment Plan for Secondary Diagnosis: Principal Problem:   Major depressive disorder, recurrent, severe without psychotic behavior (HCC)  Long Term Goal(s): Improvement in symptoms so as ready for discharge   Short Term Goals: Ability to identify changes in lifestyle to reduce recurrence of condition will improve Ability to verbalize feelings will improve Ability to disclose and discuss suicidal ideas Ability to demonstrate self-control will improve Ability to identify and develop effective coping behaviors will improve Ability to maintain clinical measurements within normal limits will improve Compliance with prescribed medications will improve Ability to identify triggers associated with substance abuse/mental health issues will improve     Medication Management: Evaluate patient's response, side effects, and tolerance of medication regimen.  Therapeutic Interventions: 1 to 1 sessions, Unit Group sessions and Medication administration.  Evaluation of Outcomes: Progressing   RN Treatment Plan for Primary Diagnosis: Major depressive disorder, recurrent, severe without psychotic behavior (HCC) Long Term Goal(s): Knowledge of disease and therapeutic regimen to maintain health will improve  Short Term Goals: Ability to remain free from injury will improve, Ability to verbalize frustration and anger appropriately will improve, Ability to demonstrate self-control, Ability to participate in decision making will improve, Ability to verbalize feelings will improve, Ability to disclose and discuss suicidal ideas, Ability to identify and develop effective coping  behaviors will improve, and Compliance with prescribed medications will improve  Medication Management: RN will administer medications as ordered by provider, will assess and evaluate patient's response and provide education to patient for prescribed medication. RN will report any adverse and/or side effects to prescribing provider.  Therapeutic Interventions: 1 on 1 counseling sessions, Psychoeducation, Medication administration, Evaluate responses to treatment, Monitor vital signs and CBGs as ordered, Perform/monitor CIWA, COWS, AIMS and Fall Risk screenings as ordered, Perform wound care treatments as ordered.  Evaluation of Outcomes: Progressing   LCSW Treatment Plan for Primary Diagnosis: Major depressive disorder, recurrent, severe without psychotic behavior (HCC) Long Term Goal(s): Safe transition to appropriate next level of care at discharge, Engage patient in therapeutic group addressing interpersonal concerns.  Short Term Goals: Engage patient in aftercare planning with referrals and resources, Increase social support, Increase ability to appropriately verbalize feelings, Increase emotional regulation, Facilitate acceptance of mental health diagnosis and concerns, Facilitate patient progression through stages of change regarding substance use diagnoses and concerns, Identify triggers associated with mental health/substance abuse issues, and Increase skills for wellness and recovery  Therapeutic Interventions: Assess for all discharge needs, 1 to 1 time with Social worker, Explore available resources and support systems, Assess for adequacy in  community support network, Educate family and significant other(s) on suicide prevention, Complete Psychosocial Assessment, Interpersonal group therapy.  Evaluation of Outcomes: Progressing   Progress in Treatment: Attending groups: Yes. Participating in groups: Yes. Taking medication as prescribed: Yes. Toleration medication:  Yes. Family/Significant other contact made: No, will contact:  brother, Geonni Stimpert. Patient understands diagnosis: Yes. Discussing patient identified problems/goals with staff: Yes. Medical problems stabilized or resolved: Yes. Denies suicidal/homicidal ideation: Yes. Issues/concerns per patient self-inventory: No. Other: none.  New problem(s) identified: No, Describe:  none identified.  New Short Term/Long Term Goal(s): medication management for mood stabilization; elimination of SI thoughts; development of comprehensive mental wellness/sobriety plan.   Patient Goals:  "It's hard to say. I'm fine here but on the outside I don't have the help I do in here."   Discharge Plan or Barriers: CSW will assist pt with development of an appropriate aftercare/discharge plan.    Reason for Continuation of Hospitalization: Depression Medication stabilization Suicidal ideation  Estimated Length of Stay: 1-7 days  Last 3 [place] Suicide Severity Risk Score: Flowsheet Row Admission (Current) from 01/23/2023 in [facility] ED from 01/22/2023 in [facility] ED from 12/10/2022 in [facility] at [facility]  C-SSRS RISK CATEGORY High Risk High Risk No Risk       Last [facility] 2/9 Scores:     No data to display          Scribe for Treatment Team: Glenis Smoker, LCSW 01/26/2023 10:40 AM

## 2023-01-26 NOTE — Progress Notes (Signed)
[facility] MD Progress Note  01/26/2023 4:29 PM Raymond [name]  MRN:  [id number]  Subjective:  Pt chart reviewed, discussed with interdisciplinary team, and seen on rounds. Pt reports mood today is "ok, feeling sad". Reports he feels he does not have friends or anyone he can rely on in [place]. States his mother's death 2 years ago affects him and he got a tattoo of her name on his arm. States she is the reason he continues going on. He states his plan is to get disability and once he gets it he is planning on moving to [place] where he has friends who are like family. He denies suicidal, homicidal ideations. He denies auditory visual hallucinations or paranoia. States sleep and appetite have been fair. Discussed with social work, considering referral to ACT. Pt initially states he is homeless in [place]. Per social work, pt has housing. When asked about this, pt states he was homeless in [place], although has housing now. Pt has repetitive hiccup like sounds while speaking. States he was diagnosed with Tourettes.   Principal Problem: Major depressive disorder, recurrent, severe without psychotic behavior (HCC)  Diagnosis: Principal Problem:   Major depressive disorder, recurrent, severe without psychotic behavior (HCC)  Total Time spent with patient:  25 minutes  Past Psychiatric History: depression and anxiety, substance use d/o  Past Medical History:  Past Medical History:  Diagnosis Date   Anxiety    Hepatitis C    PTSD (post-traumatic stress disorder)     Past Surgical History:  Procedure Laterality Date   FACIAL FRACTURE SURGERY     metal plate under right eye   Family History: History reviewed. No pertinent family history. Family Psychiatric  History: none Social History:  Social History   Substance and Sexual Activity  Alcohol Use Not Currently     Social History   Substance and Sexual Activity  Drug Use Not Currently    Social History   Socioeconomic History    Marital status: Single    Spouse name: Not on file   Number of children: Not on file   Years of education: Not on file   Highest education level: Not on file  Occupational History   Not on file  Tobacco Use   Smoking status: Some Days    Types: Cigarettes   Smokeless tobacco: Never  Vaping Use   Vaping status: Never Used  Substance and Sexual Activity   Alcohol use: Not Currently   Drug use: Not Currently   Sexual activity: Not on file  Other Topics Concern   Not on file  Social History Narrative   Not on file   Social Determinants of Health   Financial Resource Strain: Not on File (08/18/2022)   Received from [facility]    Financial Resource Strain: 0  Food Insecurity: Food Insecurity Present (01/23/2023)   Hunger Vital Sign    Worried About Running Out of Food in the Last Year: Often true    Ran Out of Food in the Last Year: Often true  Transportation Needs: Unmet Transportation Needs (01/23/2023)   PRAPARE - Administrator, Civil Service (Medical): Yes    Lack of Transportation (Non-Medical): Yes  Physical Activity: Not on File (08/18/2022)   Received from [place], [place]   Physical Activity    Physical Activity: 0  Stress: Not on File (08/18/2022)   Received from [facility], [place]   Stress    Stress: 0  Social Connections: Not on  File (08/18/2022)   Received from [place], [place]   Social Connections    Social Connections and Isolation: 0   Sleep: Fair  Appetite:  Fair  Current Medications: Current Facility-Administered Medications  Medication Dose Route Frequency Provider Last Rate Last Admin   acetaminophen (TYLENOL) tablet 650 mg  650 mg Oral Q6H PRN [name], NP       albuterol (PROVENTIL) (2.5 MG/3ML) 0.083% nebulizer solution 3 mL  3 mL Inhalation Q4H PRN Charm Rings, NP       alum & mag hydroxide-simeth (MAALOX/MYLANTA) 200-200-20 MG/5ML suspension 30 mL  30 mL Oral Q4H PRN [name], NP       FLUoxetine  (PROZAC) capsule 40 mg  40 mg Oral Daily Charm Rings, NP   40 mg at 01/26/23 1610   hydrOXYzine (ATARAX) tablet 50 mg  50 mg Oral TID PRN Charm Rings, NP       levETIRAcetam (KEPPRA) tablet 750 mg  750 mg Oral Q12H Charm Rings, NP   750 mg at 01/26/23 9604   lisinopril (ZESTRIL) tablet 10 mg  10 mg Oral Daily Charm Rings, NP   10 mg at 01/26/23 5409   magnesium hydroxide (MILK OF MAGNESIA) suspension 30 mL  30 mL Oral Daily PRN [name], NP       nicotine polacrilex (NICORETTE) gum 4 mg  4 mg Oral QID PRN Charm Rings, NP   4 mg at 01/24/23 1748   prazosin (MINIPRESS) capsule 1 mg  1 mg Oral QHS Charm Rings, NP   1 mg at 01/25/23 2112   risperiDONE (RISPERDAL) tablet 2 mg  2 mg Oral BID Charm Rings, NP   2 mg at 01/26/23 0940   traZODone (DESYREL) tablet 100 mg  100 mg Oral QHS Charm Rings, NP   100 mg at 01/25/23 2112    Lab Results:  Results for orders placed or performed during the hospital encounter of 01/23/23 (from the past 48 hour(s))  Hemoglobin A1c     Status: None   Collection Time: 01/26/23  6:00 AM  Result Value Ref Range   Hgb A1c MFr Bld 5.5 4.8 - 5.6 %    Comment: (NOTE) Pre diabetes:          5.7%-6.4%  Diabetes:              >6.4%  Glycemic control for   <7.0% adults with diabetes    Mean Plasma Glucose 111.15 mg/dL    Comment: Performed at [facility], [address], [place], [place] [address]  Lipid panel     Status: Abnormal   Collection Time: 01/26/23  6:00 AM  Result Value Ref Range   Cholesterol 157 0 - 200 mg/dL   Triglycerides 478 (H) <150 mg/dL   HDL 39 (L) >29 mg/dL   Total CHOL/HDL Ratio 4.0 RATIO   VLDL 62 (H) 0 - 40 mg/dL   LDL Cholesterol 56 0 - 99 mg/dL    Comment:        Total Cholesterol/HDL:CHD Risk Coronary Heart Disease Risk Table                     Men   Women  1/2 Average Risk   3.4   3.3  Average Risk       5.0   4.4  2 X Average Risk   9.6   7.1  3 X Average Risk  23.4  11.0        Use  the calculated Patient Ratio above and the CHD Risk Table to determine the patient's CHD Risk.        ATP III CLASSIFICATION (LDL):  <100     mg/dL   Optimal  742-595  mg/dL   Near or Above                    Optimal  130-159  mg/dL   Borderline  638-756  mg/dL   High  >433     mg/dL   Very High Performed at [facility], [address] [place], [place] [address]     Blood Alcohol level:  Lab Results  Component Value Date   [facility] <10 01/22/2023   ETH <10 11/20/2022    Metabolic Disorder Labs: Lab Results  Component Value Date   HGBA1C 5.5 01/26/2023   MPG 111.15 01/26/2023   MPG 128 10/29/2022   Lab Results  Component Value Date   PROLACTIN 12.0 01/22/2023   PROLACTIN 19.5 11/20/2022   Lab Results  Component Value Date   CHOL 157 01/26/2023   TRIG 309 (H) 01/26/2023   HDL 39 (L) 01/26/2023   CHOLHDL 4.0 01/26/2023   VLDL 62 (H) 01/26/2023   LDLCALC 56 01/26/2023   LDLCALC 93 10/29/2022    Physical Findings: AIMS:  , ,  ,  ,    CIWA:    COWS:     Musculoskeletal: Strength & Muscle Tone: within normal limits Gait & Station: normal Patient leans: N/A  Psychiatric Specialty Exam:  Presentation  General Appearance:  Appropriate for Environment; Fairly Groomed  Eye Contact: Fair  Speech: Clear and Coherent; Normal Rate; Other (comment) (repetitive hiccup like sounds while speaking)  Speech Volume: Normal  Handedness: Right   Mood and Affect  Mood: -- ("ok, feeling sad")  Affect: Blunt   Thought Process  Thought Processes: Coherent; Goal Directed; Linear  Descriptions of Associations:Intact  Orientation:Full (Time, Place and Person)  Thought Content:Logical  History of Schizophrenia/Schizoaffective disorder:No  Duration of Psychotic Symptoms:No data recorded Hallucinations:Hallucinations: None  Ideas of Reference:None  Suicidal Thoughts:Suicidal Thoughts: No  Homicidal Thoughts:Homicidal Thoughts: No   Sensorium   Memory: Immediate Good; Recent Fair  Judgment: Intact  Insight: Present   Executive Functions  Concentration: Good  Attention Span: Good  Recall: Good  Fund of Knowledge: Good  Language: Good   Psychomotor Activity  Psychomotor Activity:Psychomotor Activity: Normal   Assets  Assets: Communication Skills; Desire for Improvement; Financial Resources/Insurance; Resilience; Physical Health; Social Support   Sleep  Sleep:Sleep: Fair    Physical Exam: Physical Exam Constitutional:      General: He is not in acute distress.    Appearance: He is not ill-appearing, toxic-appearing or diaphoretic.  Eyes:     General: No scleral icterus. Cardiovascular:     Rate and Rhythm: Tachycardia present.  Pulmonary:     Effort: Pulmonary effort is normal. No respiratory distress.  Neurological:     Mental Status: He is alert and oriented to person, place, and time.  Psychiatric:        Attention and Perception: Attention and perception normal.        Mood and Affect: Mood is depressed. Affect is blunt.        Behavior: Behavior normal. Behavior is cooperative.        Thought Content: Thought content normal.    Review of Systems  Constitutional:  Negative for chills and fever.  Respiratory:  Negative for shortness of breath.   Cardiovascular:  Negative for chest pain and palpitations.  Gastrointestinal:  Negative for abdominal pain.  Neurological:  Negative for headaches.  Psychiatric/Behavioral:  Positive for depression.    Blood pressure (!) 130/93, pulse (!) 103, temperature 97.9 F (36.6 C), resp. rate (!) 21, height 6' (1.829 m), weight 76.7 kg, SpO2 97%. Body mass index is 22.92 kg/m.   Treatment Plan Summary: Daily contact with patient to assess and evaluate symptoms and progress in treatment, Medication management, and Plan    Major depressive disorder, recurrent, severe without psychosis: Prozac 40 mg daily Risperdal 2 mg  BID  Anxiety: Hydroxyzine 50 mg TID PRN   Insomnia: Trazodone 100 mg daily at bedtime PRN   Nightmares: Prazosin 1 mg daily at bedtime   Seizures: Keppra 1000 mg BID decreased to 750 mg BID  EKG normal sinus rhythm with no abnormalities  TSH WDL  A1c = 5.5 WDL  Lipid panel Total CHOL/HDL ratio 4.0  Cholesterol 157 HDL Cholesterol 39  LDL (calc) 56 Triglycerides 309 VLDL 62  Lauree Chandler, NP 01/26/2023, 4:29 PM

## 2023-01-26 NOTE — Plan of Care (Signed)
D- Patient alert and oriented. Mood is pleasant and cooperative affect is appropriate. Denies SI, HI, AVH, and pain.Marland Kitchen A- Scheduled medications administered to patient, per MD orders. Support and encouragement provided.  Routine safety checks conducted every 15 minutes.  Patient informed to notify staff with problems or concerns. R- No adverse drug reactions noted. Patient contracts for safety at this time. Patient compliant with medications and treatment plan. Patient receptive, calm, and cooperative. Patient interacts well with others on the unit.  Patient remains safe at this time.

## 2023-01-27 DIAGNOSIS — F332 Major depressive disorder, recurrent severe without psychotic features: Secondary | ICD-10-CM | POA: Diagnosis not present

## 2023-01-27 NOTE — [facility] Group Notes (Signed)
Adult Psychoeducational Group Note  Date:  01/27/2023 Time:  4:39 PM  Group Topic/Focus:  Emotional Education:   The focus of this group is to discuss what feelings/emotions are, and how they are experienced.  Participation Level:  Active  Participation Quality:  Appropriate  Affect:  Appropriate  Cognitive:  Appropriate  Insight: Appropriate  Engagement in Group:  Engaged  Modes of Intervention:  Discussion  Additional Comments:    Lucilla Edin 01/27/2023, 4:39 PMBHH Group Notes:  (Nursing/MHT/Case Management/Adjunct)

## 2023-01-27 NOTE — [facility] Counselor (Signed)
CSW sent ACTT referrals to [facility] [place] and [facility].  Penni Homans, MSW, LCSW 01/27/2023 3:14 PM

## 2023-01-27 NOTE — [facility] Suicide Risk Assessment (Signed)
[facility] INPATIENT:  Family/Significant Other Suicide Prevention Education  Suicide Prevention Education:  Contact Attempts: Zale Boeke, brother, [phone] has been identified by the patient as the family member/significant other with whom the patient will be residing, and identified as the person(s) who will aid the patient in the event of a mental health crisis.  With written consent from the patient, two attempts were made to provide suicide prevention education, prior to and/or following the patient's discharge.  We were unsuccessful in providing suicide prevention education.  A suicide education pamphlet was given to the patient to share with family/significant other.  Date and time of first attempt:01/25/23 at 2:38 pm  Date and time of second attempt: 01/27/2023 at 10:45AM  CSW left HIPAA compliant voicemail.  Harden Mo 01/27/2023, 10:45 AM

## 2023-01-27 NOTE — Progress Notes (Signed)
[facility] MD Progress Note  01/27/2023 12:45 PM Raymond [name]  MRN:  [id number]  Subjective:  Pt chart reviewed, discussed with interdisciplinary team, and seen on rounds. Pt reports mood is "ok" today. States he has "4/10 depression and 7/10 anxiety" which he is finding manageable on current medications. Declines any medication changes at this time. He denies suicidal, homicidal ideations. He denies auditory visual hallucinations or paranoia. Sleep and appetite have been fair. Confirmed with social work plan for ACT referral. Expected discharge in 1 to 2 days.   Principal Problem: Major depressive disorder, recurrent, severe without psychotic behavior (HCC)  Diagnosis: Principal Problem:   Major depressive disorder, recurrent, severe without psychotic behavior (HCC)  Total Time spent with patient: 30 minutes  Past Psychiatric History: depression and anxiety, substance use d/o  Past Medical History:  Past Medical History:  Diagnosis Date   Anxiety    Hepatitis C    PTSD (post-traumatic stress disorder)     Past Surgical History:  Procedure Laterality Date   FACIAL FRACTURE SURGERY     metal plate under right eye   Family History: History reviewed. No pertinent family history. Family Psychiatric  History: none Social History:  Social History   Substance and Sexual Activity  Alcohol Use Not Currently     Social History   Substance and Sexual Activity  Drug Use Not Currently    Social History   Socioeconomic History   Marital status: Single    Spouse name: Not on file   Number of children: Not on file   Years of education: Not on file   Highest education level: Not on file  Occupational History   Not on file  Tobacco Use   Smoking status: Some Days    Types: Cigarettes   Smokeless tobacco: Never  Vaping Use   Vaping status: Never Used  Substance and Sexual Activity   Alcohol use: Not Currently   Drug use: Not Currently   Sexual activity: Not on file  Other Topics  Concern   Not on file  Social History Narrative   Not on file   Social Determinants of Health   Financial Resource Strain: Not on File (08/18/2022)   Received from [employer] [employer]    Financial Resource Strain: 0  Food Insecurity: Food Insecurity Present (01/23/2023)   Hunger Vital Sign    Worried About Running Out of Food in the Last Year: Often true    Ran Out of Food in the Last Year: Often true  Transportation Needs: Unmet Transportation Needs (01/23/2023)   PRAPARE - Administrator, Civil Service (Medical): Yes    Lack of Transportation (Non-Medical): Yes  Physical Activity: Not on File (08/18/2022)   Received from [place], [place]   Physical Activity    Physical Activity: 0  Stress: Not on File (08/18/2022)   Received from [facility], [place]   Stress    Stress: 0  Social Connections: Not on File (08/18/2022)   Received from [place], [place]   Social Connections    Social Connections and Isolation: 0   Sleep: Fair  Appetite:  Fair  Current Medications: Current Facility-Administered Medications  Medication Dose Route Frequency Provider Last Rate Last Admin   acetaminophen (TYLENOL) tablet 650 mg  650 mg Oral Q6H PRN [name], NP       albuterol (PROVENTIL) (2.5 MG/3ML) 0.083% nebulizer solution 3 mL  3 mL Inhalation Q4H PRN Charm Rings, NP  alum & mag hydroxide-simeth (MAALOX/MYLANTA) 200-200-20 MG/5ML suspension 30 mL  30 mL Oral Q4H PRN [name], NP       FLUoxetine (PROZAC) capsule 40 mg  40 mg Oral Daily Charm Rings, NP   40 mg at 01/27/23 5732   hydrOXYzine (ATARAX) tablet 50 mg  50 mg Oral TID PRN Charm Rings, NP       levETIRAcetam (KEPPRA) tablet 750 mg  750 mg Oral Q12H Charm Rings, NP   750 mg at 01/27/23 0752   lisinopril (ZESTRIL) tablet 10 mg  10 mg Oral Daily Charm Rings, NP   10 mg at 01/27/23 2025   magnesium hydroxide (MILK OF MAGNESIA) suspension 30 mL  30 mL Oral Daily PRN [name], NP        nicotine polacrilex (NICORETTE) gum 4 mg  4 mg Oral QID PRN Charm Rings, NP   4 mg at 01/26/23 1739   prazosin (MINIPRESS) capsule 1 mg  1 mg Oral QHS Charm Rings, NP   1 mg at 01/26/23 2048   risperiDONE (RISPERDAL) tablet 2 mg  2 mg Oral BID Charm Rings, NP   2 mg at 01/27/23 4270   traZODone (DESYREL) tablet 100 mg  100 mg Oral QHS Charm Rings, NP   100 mg at 01/26/23 2048    Lab Results:  Results for orders placed or performed during the hospital encounter of 01/23/23 (from the past 48 hour(s))  Hemoglobin A1c     Status: None   Collection Time: 01/26/23  6:00 AM  Result Value Ref Range   Hgb A1c MFr Bld 5.5 4.8 - 5.6 %    Comment: (NOTE) Pre diabetes:          5.7%-6.4%  Diabetes:              >6.4%  Glycemic control for   <7.0% adults with diabetes    Mean Plasma Glucose 111.15 mg/dL    Comment: Performed at [facility], [address], [place], [place] [address]  Lipid panel     Status: Abnormal   Collection Time: 01/26/23  6:00 AM  Result Value Ref Range   Cholesterol 157 0 - 200 mg/dL   Triglycerides 283 (H) <150 mg/dL   HDL 39 (L) >15 mg/dL   Total CHOL/HDL Ratio 4.0 RATIO   VLDL 62 (H) 0 - 40 mg/dL   LDL Cholesterol 56 0 - 99 mg/dL    Comment:        Total Cholesterol/HDL:CHD Risk Coronary Heart Disease Risk Table                     Men   Women  1/2 Average Risk   3.4   3.3  Average Risk       5.0   4.4  2 X Average Risk   9.6   7.1  3 X Average Risk  23.4   11.0        Use the calculated Patient Ratio above and the CHD Risk Table to determine the patient's CHD Risk.        ATP III CLASSIFICATION (LDL):  <100     mg/dL   Optimal  176-160  mg/dL   Near or Above                    Optimal  130-159  mg/dL   Borderline  737-106  mg/dL   High  >  190     mg/dL   Very High Performed at [facility], [address] [place], [place] [address]     Blood Alcohol level:  Lab Results  Component Value Date   [facility] <10 01/22/2023    ETH <10 11/20/2022    Metabolic Disorder Labs: Lab Results  Component Value Date   HGBA1C 5.5 01/26/2023   MPG 111.15 01/26/2023   MPG 128 10/29/2022   Lab Results  Component Value Date   PROLACTIN 12.0 01/22/2023   PROLACTIN 19.5 11/20/2022   Lab Results  Component Value Date   CHOL 157 01/26/2023   TRIG 309 (H) 01/26/2023   HDL 39 (L) 01/26/2023   CHOLHDL 4.0 01/26/2023   VLDL 62 (H) 01/26/2023   LDLCALC 56 01/26/2023   LDLCALC 93 10/29/2022    Physical Findings: AIMS:  , ,  ,  ,    CIWA:    COWS:     Musculoskeletal: Strength & Muscle Tone: within normal limits Gait & Station: normal Patient leans: N/A  Psychiatric Specialty Exam:  Presentation  General Appearance:  Appropriate for Environment; Fairly Groomed  Eye Contact: Fair  Speech: Clear and Coherent; Normal Rate; Other (comment) (tics - hiccups - 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)  Speech Volume: Normal  Handedness: Right   Mood and Affect  Mood: -- ("ok")  Affect: Blunt   Thought Process  Thought Processes: Coherent; Goal Directed; Linear  Descriptions of Associations:Intact  Orientation:Full (Time, Place and Person)  Thought Content:Logical  History of Schizophrenia/Schizoaffective disorder:No  Hallucinations:Hallucinations: None  Ideas of Reference:None  Suicidal Thoughts:Suicidal Thoughts: No  Homicidal Thoughts:Homicidal Thoughts: No   Sensorium  Memory: Immediate Good; Recent Fair  Judgment: Intact  Insight: Present   Executive Functions  Concentration: Good  Attention Span: Good  Recall: Good  Fund of Knowledge: Good  Language: Good   Psychomotor Activity  Psychomotor Activity: Psychomotor Activity: Normal   Assets  Assets: Communication Skills; Desire for Improvement; Financial Resources/Insurance; Resilience; Physical Health; Social Support; Housing   Sleep  Sleep: Sleep: Fair    Physical Exam: Physical Exam Constitutional:       General: He is not in acute distress.    Appearance: He is not ill-appearing, toxic-appearing or diaphoretic.  Eyes:     General: No scleral icterus. Cardiovascular:     Rate and Rhythm: Normal rate.  Pulmonary:     Effort: Pulmonary effort is normal. No respiratory distress.  Neurological:     Mental Status: He is alert and oriented to person, place, and time.  Psychiatric:        Attention and Perception: Attention and perception normal.        Mood and Affect: Mood normal. Affect is blunt.        Speech: Speech normal.        Behavior: Behavior normal. Behavior is cooperative.        Thought Content: Thought content normal.    Review of Systems  Constitutional:  Negative for chills and fever.  Respiratory:  Negative for shortness of breath.   Cardiovascular:  Negative for chest pain and palpitations.  Gastrointestinal:  Negative for abdominal pain.  Neurological:  Negative for headaches.   Blood pressure 108/75, pulse 92, temperature (!) 97.4 F (36.3 C), temperature source Oral, resp. rate (!) 21, height 6' (1.829 m), weight 76.7 kg, SpO2 97%. Body mass index is 22.92 kg/m.  Treatment Plan Summary: Daily contact with patient to assess and evaluate symptoms and progress in treatment, Medication  management, and Plan    Major depressive disorder, recurrent, severe without psychosis: Prozac 40 mg daily Risperdal 2 mg BID   Anxiety: Hydroxyzine 50 mg TID PRN   Insomnia: Trazodone 100 mg daily at bedtime PRN   Nightmares: Prazosin 1 mg daily at bedtime   Seizures: Keppra 1000 mg BID decreased to 750 mg BID   EKG normal sinus rhythm with no abnormalities   TSH WDL   A1c = 5.5 WDL   Lipid panel Total CHOL/HDL ratio 4.0  Cholesterol 157 HDL Cholesterol 39  LDL (calc) 56 Triglycerides 309 VLDL 62  Expected discharge in 1 to 2 days  Lauree Chandler, NP 01/27/2023, 12:45 PM

## 2023-01-27 NOTE — Plan of Care (Signed)
Pt denies SI/HI/AVH, active in his care plan and hopeful to D/C this week  Problem: Education: Goal: Knowledge of General Education information will improve Description: Including pain rating scale, medication(s)/side effects and non-pharmacologic comfort measures Outcome: Progressing   Problem: Health Behavior/Discharge Planning: Goal: Ability to manage health-related needs will improve Outcome: Progressing   Problem: Clinical Measurements: Goal: Ability to maintain clinical measurements within normal limits will improve Outcome: Progressing Goal: Will remain free from infection Outcome: Progressing Goal: Diagnostic test results will improve Outcome: Progressing Goal: Respiratory complications will improve Outcome: Progressing Goal: Cardiovascular complication will be avoided Outcome: Progressing   Problem: Activity: Goal: Risk for activity intolerance will decrease Outcome: Progressing   Problem: Nutrition: Goal: Adequate nutrition will be maintained Outcome: Progressing   Problem: Coping: Goal: Level of anxiety will decrease Outcome: Progressing   Problem: Elimination: Goal: Will not experience complications related to bowel motility Outcome: Progressing Goal: Will not experience complications related to urinary retention Outcome: Progressing   Problem: Pain Managment: Goal: General experience of comfort will improve Outcome: Progressing   Problem: Safety: Goal: Ability to remain free from injury will improve Outcome: Progressing   Problem: Skin Integrity: Goal: Risk for impaired skin integrity will decrease Outcome: Progressing   Problem: Education: Goal: Knowledge of [place] General Education information/materials will improve Outcome: Progressing Goal: Emotional status will improve Outcome: Progressing Goal: Mental status will improve Outcome: Progressing Goal: Verbalization of understanding the information provided will improve Outcome:  Progressing   Problem: Activity: Goal: Interest or engagement in activities will improve Outcome: Progressing Goal: Sleeping patterns will improve Outcome: Progressing   Problem: Coping: Goal: Ability to verbalize frustrations and anger appropriately will improve Outcome: Progressing Goal: Ability to demonstrate self-control will improve Outcome: Progressing   Problem: Health Behavior/Discharge Planning: Goal: Identification of resources available to assist in meeting health care needs will improve Outcome: Progressing Goal: Compliance with treatment plan for underlying cause of condition will improve Outcome: Progressing   Problem: Physical Regulation: Goal: Ability to maintain clinical measurements within normal limits will improve Outcome: Progressing   Problem: Safety: Goal: Periods of time without injury will increase Outcome: Progressing   Problem: Self-Concept: Goal: Ability to identify factors that promote anxiety will improve Outcome: Progressing Goal: Level of anxiety will decrease Outcome: Progressing Goal: Ability to modify response to factors that promote anxiety will improve Outcome: Progressing   Problem: Coping: Goal: Coping ability will improve Outcome: Progressing Goal: Will verbalize feelings Outcome: Progressing   Problem: Coping: Goal: Coping ability will improve Outcome: Progressing   Problem: Health Behavior/Discharge Planning: Goal: Identification of resources available to assist in meeting health care needs will improve Outcome: Progressing

## 2023-01-27 NOTE — B[facility] Counselor (Signed)
CSW spoke with patient about ACT team referrals.  Patient requested that referrals be sent.   CSW to follow up.  Penni Homans, MSW, LCSW 01/27/2023 2:30 PM

## 2023-01-27 NOTE — Plan of Care (Signed)
  Problem: Education: Goal: Knowledge of General Education information will improve Description: Including pain rating scale, medication(s)/side effects and non-pharmacologic comfort measures Outcome: Progressing   Problem: Health Behavior/Discharge Planning: Goal: Ability to manage health-related needs will improve Outcome: Progressing   Problem: Clinical Measurements: Goal: Ability to maintain clinical measurements within normal limits will improve Outcome: Progressing Goal: Will remain free from infection Outcome: Progressing Goal: Diagnostic test results will improve Outcome: Progressing Goal: Respiratory complications will improve Outcome: Progressing Goal: Cardiovascular complication will be avoided Outcome: Progressing   Problem: Activity: Goal: Risk for activity intolerance will decrease Outcome: Progressing   Problem: Nutrition: Goal: Adequate nutrition will be maintained Outcome: Progressing   Problem: Coping: Goal: Level of anxiety will decrease Outcome: Progressing   Problem: Elimination: Goal: Will not experience complications related to bowel motility Outcome: Progressing Goal: Will not experience complications related to urinary retention Outcome: Progressing   Problem: Pain Managment: Goal: General experience of comfort will improve Outcome: Progressing   Problem: Safety: Goal: Ability to remain free from injury will improve Outcome: Progressing   Problem: Skin Integrity: Goal: Risk for impaired skin integrity will decrease Outcome: Progressing   Problem: Education: Goal: Knowledge of [place] [facility] Education information/materials will improve Outcome: Progressing Goal: Emotional status will improve Outcome: Progressing Goal: Mental status will improve Outcome: Progressing Goal: Verbalization of understanding the information provided will improve Outcome: Progressing   Problem: Activity: Goal: Interest or engagement in activities will  improve Outcome: Progressing Goal: Sleeping patterns will improve Outcome: Progressing   Problem: Coping: Goal: Ability to verbalize frustrations and anger appropriately will improve Outcome: Progressing Goal: Ability to demonstrate self-control will improve Outcome: Progressing   Problem: Health Behavior/Discharge Planning: Goal: Identification of resources available to assist in meeting health care needs will improve Outcome: Progressing Goal: Compliance with treatment plan for underlying cause of condition will improve Outcome: Progressing   Problem: Physical Regulation: Goal: Ability to maintain clinical measurements within normal limits will improve Outcome: Progressing   Problem: Safety: Goal: Periods of time without injury will increase Outcome: Progressing   Problem: Self-Concept: Goal: Ability to identify factors that promote anxiety will improve Outcome: Progressing Goal: Level of anxiety will decrease Outcome: Progressing Goal: Ability to modify response to factors that promote anxiety will improve Outcome: Progressing   Problem: Coping: Goal: Coping ability will improve Outcome: Progressing Goal: Will verbalize feelings Outcome: Progressing   Problem: Coping: Goal: Coping ability will improve Outcome: Progressing   Problem: Health Behavior/Discharge Planning: Goal: Identification of resources available to assist in meeting health care needs will improve Outcome: Progressing

## 2023-01-27 NOTE — Progress Notes (Signed)
Pt calm and pleasant during assessment denying SI/HI/AVH. Pt observed interacting appropriately with peers and staff on the unit. Pt endorses anxiety and depression. Pt compliant with medication administration per MD orders. Pt give education, support, and encouragement to be active in his treatment plan. Pt being monitored Q 15 minutes for safety per unit protocol, remains safe on the unit

## 2023-01-27 NOTE — Group Note (Signed)
Date:  01/27/2023 Time:  10:21 AM  Group Topic/Focus:  Goals Group:   The focus of this group is to help patients establish daily goals to achieve during treatment and discuss how the patient can incorporate goal setting into their daily lives to aide in recovery.    Participation Level:  Active  Participation Quality:  Appropriate  Affect:  Appropriate  Cognitive:  Appropriate  Insight: Appropriate  Engagement in Group:  Engaged  Modes of Intervention:  Discussion, Education, and Support  Additional Comments:    Wilford Corner 01/27/2023, 10:21 AM

## 2023-01-27 NOTE — Progress Notes (Signed)
D- Patient alert and oriented x 4. Affect anxious/mood congruent. Denies SI/ HI/ AVH. Patient denies pain. Patient endorses depression and anxiety. States he is being discharged tomorrow and  "ready to go". Denies seizure activity or other neuro complains. A- Scheduled medications administered to patient, per MD orders. Support and encouragement provided.  Routine safety checks conducted every 15 minutes without incident.  Patient informed to notify staff with problems or concerns and verbalizes understanding. R- No adverse drug reactions noted.  Patient compliant with medications and treatment plan. Patient receptive, calm, cooperative and interacts well with others on the unit.  Patient contracts for safety and  remains safe on the unit at this time.

## 2023-01-27 NOTE — Group Note (Signed)
[facility] LCSW Group Therapy Note   Group Date: 01/27/2023 Start Time: 1315 End Time: 1405  Type of Therapy/Topic:  Group Therapy:  Feelings about Diagnosis  Participation Level:  Minimal    Description of Group:    This group will allow patients to explore their thoughts and feelings about diagnoses they have received. Patients will be guided to explore their level of understanding and acceptance of these diagnoses. Facilitator will encourage patients to process their thoughts and feelings about the reactions of others to their diagnosis, and will guide patients in identifying ways to discuss their diagnosis with significant others in their lives. This group will be process-oriented, with patients participating in exploration of their own experiences as well as giving and receiving support and challenge from other group members.   Therapeutic Goals: 1. Patient will demonstrate understanding of diagnosis as evidence by identifying two or more symptoms of the disorder:  2. Patient will be able to express two feelings regarding the diagnosis 3. Patient will demonstrate ability to communicate their needs through discussion and/or role plays  Summary of Patient Progress: Patient was present for the majority of the group process. He participated in the icebreaker at the beginning but then was not present in the larger group discussion despite remaining in the room. Peers called pt out for not talking today. Despite lack of involvement in the discussion, pt appeared to be attending to the conversation. He appeared open and receptive to feedback/comments from both peers and facilitator. Insight into the topic remains questionable.    Therapeutic Modalities:   Cognitive Behavioral Therapy Brief Therapy Feelings Identification    Glenis Smoker, LCSW

## 2023-01-27 NOTE — Group Note (Signed)
Recreation Therapy Group Note   Group Topic:Coping Skills  Group Date: 01/27/2023 Start Time: 1010 End Time: 1100 Facilitators: Rosina Lowenstein, LRT, CTRS Location:  Craft Room  Group Description: Mind Map.  Patient was provided a blank template of a diagram with 32 blank boxes in a tiered system, branching from the center (similar to a bubble chart). LRT directed patients to label the middle of the diagram "Coping Skills". LRT and patients then came up with 8 different coping skills as examples. Pt were directed to record their coping skills in the 2nd tier boxes closest to the center.  Patients would then share their coping skills with the group as LRT wrote them out. LRT gave a handout of 99 different coping skills at the end of group.    Goal Area(s) Addressed: Patients will be able to define "coping skills". Patient will identify new coping skills.  Patient will increase communication.   Affect/Mood: Appropriate   Participation Level: Active and Engaged   Participation Quality: Independent   Behavior: Appropriate, Calm, and Cooperative   Speech/Thought Process: Coherent   Insight: Good   Judgement: Good   Modes of Intervention: Activity and Worksheet   Patient Response to Interventions:  Attentive, Engaged, Interested , and Receptive   Education Outcome:  Acknowledges education   Clinical Observations/Individualized Feedback: Jaxin was active in their participation of session activities and group discussion. Pt identified "honoring someone, mindfulness, talk to someone, love" as coping skills. Pt spontaneously contributed to group discussion while interacting well with LRT and peers duration of session.   Plan: Continue to engage patient in RT group sessions 2-3x/week.   Rosina Lowenstein, LRT, CTRS 01/27/2023 11:30 AM

## 2023-01-28 DIAGNOSIS — F332 Major depressive disorder, recurrent severe without psychotic features: Secondary | ICD-10-CM | POA: Diagnosis not present

## 2023-01-28 MED ORDER — LORAZEPAM 1 MG PO TABS
1.0000 mg | ORAL_TABLET | Freq: Once | ORAL | Status: AC
  Administered 2023-01-28: 1 mg via ORAL
  Filled 2023-01-28: qty 1

## 2023-01-28 NOTE — [facility] Counselor (Signed)
CSW has sent referral for [facility] for ACTT team.  Penni Homans, MSW, LCSW 01/28/2023 4:36 PM

## 2023-01-28 NOTE — Group Note (Signed)
Date:  01/28/2023 Time:  5:15 PM  Group Topic/Focus:  Rediscovering Joy:   The focus of this group is to explore various ways to relieve stress in a positive manner.    Participation Level:  Active  Participation Quality:  Appropriate, Attentive, Sharing, and Supportive  Affect:  Appropriate and Excited  Cognitive:  Alert, Appropriate, and Oriented  Insight: Appropriate  Engagement in Group:  Developing/Improving, Engaged, and Supportive  Modes of Intervention:  Activity, Discussion, Education, Socialization, and Support  Additional Comments:    Rosaura Carpenter 01/28/2023, 5:15 PM

## 2023-01-28 NOTE — Group Note (Signed)
Recreation Therapy Group Note   Group Topic:Relaxation  Group Date: 01/28/2023 Start Time: 1030 End Time: 1120 Facilitators: Rosina Lowenstein, LRT, CTRS Location:  Craft Room  Group Description: PMR (Progressive Muscle Relaxation). LRT asks patients their current level of stress/anxiety from 1-10, with 10 being the highest. LRT educates patients on what PMR is and the benefits that come from it. Patients are asked to sit with their feet flat on the floor while sitting up and all the way back in their chair, if possible. LRT and pts follow a prompt through a speaker that requires you to tense and release different muscles in their body and focus on their breathing. During session, lights are off and soft music is being played. At the end of the prompt, LRT asks patients to rank their current levels of stress/anxiety from 1-10, 10 being the highest.   Goal Area(s) Addressed:  Patients will be able to describe progressive muscle relaxation.  Patient will practice using relaxation technique. Patient will identify a new coping skill.  Patient will follow multistep directions to reduce anxiety and stress.    Affect/Mood: Appropriate   Participation Level: Active and Engaged   Participation Quality: Independent   Behavior: Appropriate, Calm, and Cooperative   Speech/Thought Process: Coherent   Insight: Good   Judgement: Good   Modes of Intervention: Activity   Patient Response to Interventions:  Attentive, Disengaged, Interested , and Receptive   Education Outcome:  Acknowledges education   Clinical Observations/Individualized Feedback: Raymond [name] was active in their participation of session activities and group discussion. Pt identified that his anxiety and stress were a 3 before he session. After, he rated them a 1. Pt appropriately followed prompt while interacting well with LRT and peers duration of session.   Plan: Continue to engage patient in RT group sessions  2-3x/week.   Rosina Lowenstein, LRT, CTRS 01/28/2023 12:40 PM

## 2023-01-28 NOTE — Progress Notes (Signed)
[facility] MD Progress Note  01/28/2023 9:51 AM Raymond [name]  MRN:  [id number]  Subjective:  Pt chart reviewed, discussed with interdisciplinary team, and seen on rounds. Reports mood today is "a lot better". Tells me "I'm not depressed. I don't have anxiety. I don't feel suicidal or nothing". Denies suicidal, homicidal ideations. Denies auditory visual hallucinations or paranoia. When asked what has been helpful for him, states "talking to people here, going to groups". He would like to attend groups today and discharge tomorrow. 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 He is hoping to discharge directly there and make volunteer horus. Discussed would let team know. Expected discharge tomorrow. ACT team referral was made yesterday by social work. Appreciate social work assistance.  Principal Problem: Major depressive disorder, recurrent, severe without psychotic behavior (HCC) Diagnosis: Principal Problem:   Major depressive disorder, recurrent, severe without psychotic behavior (HCC)  Total Time spent with patient:  25 minutes  Past Psychiatric History: depression and anxiety, substance use d/o  Past Medical History:  Past Medical History:  Diagnosis Date   Anxiety    Hepatitis C    PTSD (post-traumatic stress disorder)     Past Surgical History:  Procedure Laterality Date   FACIAL FRACTURE SURGERY     metal plate under right eye   Family History: History reviewed. No pertinent family history. Family Psychiatric  History: none Social History:  Social History   Substance and Sexual Activity  Alcohol Use Not Currently     Social History   Substance and Sexual Activity  Drug Use Not Currently    Social History   Socioeconomic History   Marital status: Single    Spouse name: Not on file   Number of children: Not on file   Years of education: Not on file   Highest education level: Not on file  Occupational History   Not on file  Tobacco  Use   Smoking status: Some Days    Types: Cigarettes   Smokeless tobacco: Never  Vaping Use   Vaping status: Never Used  Substance and Sexual Activity   Alcohol use: Not Currently   Drug use: Not Currently   Sexual activity: Not on file  Other Topics Concern   Not on file  Social History Narrative   Not on file   Social Determinants of Health   Financial Resource Strain: Not on File (08/18/2022)   Received from [employer], [employer]    Financial Resource Strain: 0  Food Insecurity: Food Insecurity Present (01/23/2023)   Hunger Vital Sign    Worried About Running Out of Food in the Last Year: Often true    Ran Out of Food in the Last Year: Often true  Transportation Needs: Unmet Transportation Needs (01/23/2023)   PRAPARE - Administrator, Civil Service (Medical): Yes    Lack of Transportation (Non-Medical): Yes  Physical Activity: Not on File (08/18/2022)   Received from [place], [place]   Physical Activity    Physical Activity: 0  Stress: Not on File (08/18/2022)   Received from [facility], [place]   Stress    Stress: 0  Social Connections: Not on File (08/18/2022)   Received from [place], [place]   Social Connections    Social Connections and Isolation: 0   Sleep: Good  Appetite:  Good  Current Medications: Current Facility-Administered Medications  Medication Dose Route Frequency Provider Last Rate Last Admin   acetaminophen (TYLENOL) tablet 650 mg  650 mg Oral Q6H PRN [name], NP       albuterol (PROVENTIL) (2.5 MG/3ML) 0.083% nebulizer solution 3 mL  3 mL Inhalation Q4H PRN Charm Rings, NP       alum & mag hydroxide-simeth (MAALOX/MYLANTA) 200-200-20 MG/5ML suspension 30 mL  30 mL Oral Q4H PRN [name], NP       FLUoxetine (PROZAC) capsule 40 mg  40 mg Oral Daily Charm Rings, NP   40 mg at 01/28/23 0848   hydrOXYzine (ATARAX) tablet 50 mg  50 mg Oral TID PRN Charm Rings, NP       levETIRAcetam (KEPPRA) tablet 750 mg  750 mg  Oral Q12H Charm Rings, NP   750 mg at 01/28/23 0848   lisinopril (ZESTRIL) tablet 10 mg  10 mg Oral Daily Charm Rings, NP   10 mg at 01/28/23 0848   magnesium hydroxide (MILK OF MAGNESIA) suspension 30 mL  30 mL Oral Daily PRN [name], NP       nicotine polacrilex (NICORETTE) gum 4 mg  4 mg Oral QID PRN Charm Rings, NP   4 mg at 01/26/23 1739   prazosin (MINIPRESS) capsule 1 mg  1 mg Oral QHS Charm Rings, NP   1 mg at 01/27/23 2056   risperiDONE (RISPERDAL) tablet 2 mg  2 mg Oral BID Charm Rings, NP   2 mg at 01/28/23 0848   traZODone (DESYREL) tablet 100 mg  100 mg Oral QHS Charm Rings, NP   100 mg at 01/27/23 2056    Lab Results: No results found for this or any previous visit (from the past 48 hour(s)).  Blood Alcohol level:  Lab Results  Component Value Date   ETH <10 01/22/2023   ETH <10 11/20/2022    Metabolic Disorder Labs: Lab Results  Component Value Date   HGBA1C 5.5 01/26/2023   MPG 111.15 01/26/2023   MPG 128 10/29/2022   Lab Results  Component Value Date   PROLACTIN 12.0 01/22/2023   PROLACTIN 19.5 11/20/2022   Lab Results  Component Value Date   CHOL 157 01/26/2023   TRIG 309 (H) 01/26/2023   HDL 39 (L) 01/26/2023   CHOLHDL 4.0 01/26/2023   VLDL 62 (H) 01/26/2023   LDLCALC 56 01/26/2023   LDLCALC 93 10/29/2022    Physical Findings: AIMS:  , ,  ,  ,    CIWA:    COWS:     Musculoskeletal: Strength & Muscle Tone: within normal limits Gait & Station: normal Patient leans: N/A  Psychiatric Specialty Exam:  Presentation  General Appearance:  Appropriate for Environment; Fairly Groomed  Eye Contact: Fair  Speech: Clear and Coherent; Normal Rate; Other (comment) (tics - hiccups - pt reports he has Tourettes)  Speech Volume: Normal  Handedness: Right   Mood and Affect  Mood: -- ("a lot better")  Affect: Full Range   Thought Process  Thought Processes: Coherent; Goal Directed; Linear  Descriptions of  Associations:Intact  Orientation:Full (Time, Place and Person)  Thought Content:Logical  History of Schizophrenia/Schizoaffective disorder:No  Hallucinations:Hallucinations: None  Ideas of Reference:None  Suicidal Thoughts:Suicidal Thoughts: No  Homicidal Thoughts:Homicidal Thoughts: No   Sensorium  Memory: Immediate Good; Recent Fair  Judgment: Intact  Insight: Present   Executive Functions  Concentration: Good  Attention Span: Good  Recall: Good  Fund of Knowledge: Good  Language: Good   Psychomotor Activity  Psychomotor Activity: Psychomotor Activity: Normal   Assets  Assets: Communication  Skills; Desire for Improvement; Financial Resources/Insurance; Resilience; Housing; Physical Health; Leisure Time   Sleep  Sleep: Sleep: Good    Physical Exam: Physical Exam Constitutional:      General: He is not in acute distress.    Appearance: He is not ill-appearing, toxic-appearing or diaphoretic.  Eyes:     General: No scleral icterus. Cardiovascular:     Rate and Rhythm: Normal rate.  Pulmonary:     Effort: Pulmonary effort is normal. No respiratory distress.  Neurological:     Mental Status: He is alert and oriented to person, place, and time.  Psychiatric:        Attention and Perception: Attention and perception normal.        Mood and Affect: Mood and affect normal.        Speech: Speech normal.        Behavior: Behavior normal. Behavior is cooperative.        Thought Content: Thought content normal.        Cognition and Memory: Cognition and memory normal.        Judgment: Judgment normal.    Review of Systems  Constitutional:  Negative for chills and fever.  Respiratory:  Negative for shortness of breath.   Cardiovascular:  Negative for chest pain and palpitations.  Gastrointestinal:  Negative for abdominal pain.  Neurological:  Negative for headaches.  Psychiatric/Behavioral:  Negative for depression, hallucinations and  suicidal ideas. The patient is not nervous/anxious and does not have insomnia.    Blood pressure (!) 130/93, pulse 83, temperature 98.4 F (36.9 C), resp. rate (!) 22, height 6' (1.829 m), weight 76.7 kg, SpO2 98%. Body mass index is 22.92 kg/m.  Treatment Plan Summary: Daily contact with patient to assess and evaluate symptoms and progress in treatment, Medication management, and Plan    Major depressive disorder, recurrent, severe without psychosis: Prozac 40 mg daily Risperdal 2 mg BID   Anxiety: Hydroxyzine 50 mg TID PRN   Insomnia: Trazodone 100 mg daily at bedtime PRN   Nightmares: Prazosin 1 mg daily at bedtime   Seizures: Keppra 1000 mg BID decreased to 750 mg BID   EKG normal sinus rhythm with no abnormalities   TSH WDL   A1c = 5.5 WDL   Lipid panel Total CHOL/HDL ratio 4.0  Cholesterol 157 HDL Cholesterol 39  LDL (calc) 56 Triglycerides 309 VLDL 62  Expected discharge tomorrow  Lauree Chandler, NP 01/28/2023, 9:51 AM

## 2023-01-28 NOTE — Group Note (Signed)
Date:  01/28/2023 Time:  10:52 AM  Group Topic/Focus:  Self Esteem Action Plan:   The focus of this group is to help patients create a plan to continue to build self-esteem after discharge.    Participation Level:  Active  Participation Quality:  Appropriate, Attentive, and Sharing  Affect:  Appropriate  Cognitive:  Alert, Appropriate, and Oriented  Insight: Appropriate  Engagement in Group:  Developing/Improving, Engaged, and Supportive  Modes of Intervention:  Activity, Discussion, Education, Rapport Building, and Socialization  Additional Comments:    Rosaura Carpenter 01/28/2023, 10:52 AM

## 2023-01-28 NOTE — Group Note (Signed)
Date:  01/28/2023 Time:  9:21 PM  Group Topic/Focus:  Wellness Toolbox:   The focus of this group is to discuss various aspects of wellness, balancing those aspects and exploring ways to increase the ability to experience wellness.  Patients will create a wellness toolbox for use upon discharge.    Participation Level:  Active  Participation Quality:  Appropriate, Attentive, Sharing, and Supportive  Affect:  Appropriate  Cognitive:  Appropriate  Insight: Appropriate and Good  Engagement in Group:  Engaged and Supportive  Modes of Intervention:  Discussion and Support  Additional Comments:     Belva Crome 01/28/2023, 9:21 PM

## 2023-01-28 NOTE — Progress Notes (Signed)
When the MHT was doing her rounds pt stated to them that he had a seizure in bed. Pt assessed, V/S obtained and NP Rashaun Dixon was notified. Pt given PRN medications. Pt is stable and will be monitored Q 15 minutes for safety per unit protocol and remains safe on the unit

## 2023-01-28 NOTE — Progress Notes (Signed)
Pt is pleasant and cooperative Denies S/I or H/I or Avh. Future oriented and thinking about discharge

## 2023-01-28 NOTE — Group Note (Signed)
Date:  01/28/2023 Time:  12:14 AM  Group Topic/Focus:  Wrap-Up Group:   The focus of this group is to help patients review their daily goal of treatment and discuss progress on daily workbooks.    Participation Level:  Active  Participation Quality:  Appropriate and Monopolizing  Affect:  Appropriate and Excited  Cognitive:  Alert and Disorganized  Insight: Appropriate  Engagement in Group:  Distracting and Engaged  Modes of Intervention:  Activity  Additional Comments:     Maglione,Astaria Nanez E 01/28/2023, 12:14 AM

## 2023-01-29 DIAGNOSIS — F332 Major depressive disorder, recurrent severe without psychotic features: Secondary | ICD-10-CM | POA: Diagnosis not present

## 2023-01-29 MED ORDER — LEVETIRACETAM 750 MG PO TABS: 750.0000 mg | ORAL_TABLET | Freq: Two times a day (BID) | ORAL | 0 refills | Status: DC

## 2023-01-29 NOTE — Group Note (Signed)
Date:  01/29/2023 Time:  10:17 AM  Group Topic/Focus:  Crisis Planning:   The purpose of this group is to help patients create a crisis plan for use upon discharge or in the future, as needed.    Participation Level:  Active  Participation Quality:  Appropriate  Affect:  Appropriate  Cognitive:  Appropriate  Insight: Appropriate  Engagement in Group:  Engaged  Modes of Intervention:  Activity  Additional Comments:    Mary Sella Carron Jaggi 01/29/2023, 10:17 AM

## 2023-01-29 NOTE — Plan of Care (Signed)
Pt denies SI/HI/AVH, follows procedures on the unit  Problem: Education: Goal: Knowledge of General Education information will improve Description: Including pain rating scale, medication(s)/side effects and non-pharmacologic comfort measures Outcome: Progressing   Problem: Health Behavior/Discharge Planning: Goal: Ability to manage health-related needs will improve Outcome: Progressing   Problem: Clinical Measurements: Goal: Ability to maintain clinical measurements within normal limits will improve Outcome: Progressing Goal: Will remain free from infection Outcome: Progressing Goal: Diagnostic test results will improve Outcome: Progressing Goal: Respiratory complications will improve Outcome: Progressing Goal: Cardiovascular complication will be avoided Outcome: Progressing   Problem: Activity: Goal: Risk for activity intolerance will decrease Outcome: Progressing   Problem: Nutrition: Goal: Adequate nutrition will be maintained Outcome: Progressing   Problem: Coping: Goal: Level of anxiety will decrease Outcome: Progressing   Problem: Elimination: Goal: Will not experience complications related to bowel motility Outcome: Progressing Goal: Will not experience complications related to urinary retention Outcome: Progressing   Problem: Pain Managment: Goal: General experience of comfort will improve Outcome: Progressing   Problem: Safety: Goal: Ability to remain free from injury will improve Outcome: Progressing   Problem: Skin Integrity: Goal: Risk for impaired skin integrity will decrease Outcome: Progressing   Problem: Education: Goal: Knowledge of [place] [facility] Education information/materials will improve Outcome: Progressing Goal: Emotional status will improve Outcome: Progressing Goal: Mental status will improve Outcome: Progressing Goal: Verbalization of understanding the information provided will improve Outcome: Progressing   Problem:  Activity: Goal: Interest or engagement in activities will improve Outcome: Progressing Goal: Sleeping patterns will improve Outcome: Progressing   Problem: Coping: Goal: Ability to verbalize frustrations and anger appropriately will improve Outcome: Progressing Goal: Ability to demonstrate self-control will improve Outcome: Progressing   Problem: Health Behavior/Discharge Planning: Goal: Identification of resources available to assist in meeting health care needs will improve Outcome: Progressing Goal: Compliance with treatment plan for underlying cause of condition will improve Outcome: Progressing   Problem: Physical Regulation: Goal: Ability to maintain clinical measurements within normal limits will improve Outcome: Progressing   Problem: Safety: Goal: Periods of time without injury will increase Outcome: Progressing   Problem: Self-Concept: Goal: Ability to identify factors that promote anxiety will improve Outcome: Progressing Goal: Level of anxiety will decrease Outcome: Progressing Goal: Ability to modify response to factors that promote anxiety will improve Outcome: Progressing   Problem: Coping: Goal: Coping ability will improve Outcome: Progressing Goal: Will verbalize feelings Outcome: Progressing   Problem: Coping: Goal: Coping ability will improve Outcome: Progressing   Problem: Health Behavior/Discharge Planning: Goal: Identification of resources available to assist in meeting health care needs will improve Outcome: Progressing

## 2023-01-29 NOTE — Progress Notes (Signed)
Pt alert and oriented. Pt rates depression and anxiety 0/10.pt denies experiencing any SI/HI or AVH at this time. Pt was concerned about BP being elavted eralier , we checked and it was within normal limits.   Scheduled meds administered to pt MD orders. Support and encouragement provided.  No adverse drug reactions noted. Pt verbally contracts for safety at this time. Pt complaint with medication and treatment plan. Pt interacts well with others on the unit. Pt remains safe at this time on the unit , will continue to monitor.

## 2023-01-29 NOTE — BHH Counselor (Signed)
While CSW met with patient to complete his discharge.  Patient reported being very upset.  He stated "why I have to leave when that lady has been here since July 21st?" In reference to another patient.  CSW provided active listening and sympathy.  CSW advised patient not compare himself to other patients.    CSW observed Gigi RN to provide patient with the suicide checklist.  Patient was upset at having to fill it out as evidenced by statements and demeanor.  CSW explained that it was not a requirement for patient to fill out the form, however, patient began to complete it.  As Gigi RN stepped away pt stated "if I wanted to harm myself, this paper wont stop me, you can't stop me".  CSW informed Catha Nottingham NP, Dr. Julien Nordmann RN and CSW team.  Penni Homans, MSW, LCSW 01/29/2023 10:07 AM

## 2023-01-29 NOTE — Group Note (Signed)
Recreation Therapy Group Note   Group Topic:Health and Wellness  Group Date: 01/29/2023 Start Time: 1000 End Time: 1050 Facilitators: Rosina Lowenstein, LRT, CTRS Location: Courtyard  Group Description: [employer]. LRT and patients played games of basketball, drew with chalk, and played [employer] while outside in the courtyard while getting fresh air and sunlight. Music was being played in the background. LRT and peers conversed about different games they have played before. LRT encouraged pts to drink water after being outside, sweating and getting their heart rate up.  Goal Area(s) Addressed: Patient will build on frustration tolerance skills. Patients will partake in a competitive play game with peers. Patients will gain knowledge of new leisure interest/hobby.    Affect/Mood: Appropriate   Participation Level: Active and Engaged   Participation Quality: Independent   Behavior: Appropriate, Calm, and Cooperative   Speech/Thought Process: Coherent   Insight: Good   Judgement: Good   Modes of Intervention: Activity   Patient Response to Interventions:  Attentive, Engaged, Interested , and Receptive   Education Outcome:  Acknowledges education   Clinical Observations/Individualized Feedback: Raymond [name] was active in their participation of session activities and group discussion. Pt identified "drift away" as a song request. Pt played games of [employer] with peers while outside. Pt interacted well with LRT and peers duration of session.   Plan: Continue to engage patient in RT group sessions 2-3x/week.   Rosina Lowenstein, LRT, CTRS 01/29/2023 11:14 AM

## 2023-01-29 NOTE — BHH Suicide Risk Assessment (Signed)
[facility] Discharge Suicide Risk Assessment   Principal Problem: Major depressive disorder, recurrent, severe without psychotic behavior (HCC) Discharge Diagnoses: Principal Problem:   Major depressive disorder, recurrent, severe without psychotic behavior (HCC)   Total Time spent with patient: 45 minutes  Musculoskeletal: Strength & Muscle Tone: within normal limits Gait & Station: normal Patient leans: N/A  Psychiatric Specialty Exam: Physical Exam Vitals and nursing note reviewed.  Constitutional:      Appearance: Normal appearance.  HENT:     Head: Normocephalic.     Nose: Nose normal.  Pulmonary:     Effort: Pulmonary effort is normal.  Musculoskeletal:        General: Normal range of motion.     Cervical back: Normal range of motion.  Neurological:     General: No focal deficit present.     Mental Status: He is alert and oriented to person, place, and time.     Review of Systems  All other systems reviewed and are negative.   Blood pressure 120/84, pulse 76, temperature 97.9 F (36.6 C), temperature source Oral, resp. rate (!) 22, height 6' (1.829 m), weight 76.7 kg, SpO2 99%.Body mass index is 22.92 kg/m.  General Appearance: Casual  Eye Contact:  Good  Speech:  Normal Rate  Volume:  Normal  Mood:  Euthymic  Affect:  Appropriate  Thought Process:  Coherent  Orientation:  Full (Time, Place, and Person)  Thought Content:  WDL and Logical  Suicidal Thoughts:  No  Homicidal Thoughts:  No  Memory:  Immediate;   Good Recent;   Good Remote;   Good  Judgement:  Good  Insight:  Good  Psychomotor Activity:  Normal  Concentration:  Concentration: Good and Attention Span: Good  Recall:  Good  Fund of Knowledge:  Good  Language:  Good  Akathisia:  No  Handed:  Right  AIMS (if indicated):     Assets:  Housing Leisure Time Physical Health Resilience Social Support  ADL's:  Intact  Cognition:  WNL  Sleep:       Physical Exam: Physical Exam Vitals and  nursing note reviewed.  Constitutional:      Appearance: Normal appearance.  HENT:     Head: Normocephalic.     Nose: Nose normal.  Pulmonary:     Effort: Pulmonary effort is normal.  Musculoskeletal:        General: Normal range of motion.     Cervical back: Normal range of motion.  Neurological:     General: No focal deficit present.     Mental Status: He is alert and oriented to person, place, and time.    Review of Systems  All other systems reviewed and are negative.  Blood pressure 120/84, pulse 76, temperature 97.9 F (36.6 C), temperature source Oral, resp. rate (!) 22, height 6' (1.829 m), weight 76.7 kg, SpO2 99%. Body mass index is 22.92 kg/m.  Mental Status Per Nursing Assessment::   On Admission:  Self-harm thoughts  Demographic Factors:  Male and Living alone  Loss Factors: NA  Historical Factors: NA  Risk Reduction Factors:   Sense of responsibility to family, Positive social support, Positive therapeutic relationship, and Positive coping skills or problem solving skills  Continued Clinical Symptoms:  None  Cognitive Features That Contribute To Risk:  None    Suicide Risk:  Minimal: No identifiable suicidal ideation.  Patients presenting with no risk factors but with morbid ruminations; may be classified as minimal risk based on the severity of the  depressive symptoms   Plan Of Care/Follow-up recommendations:  Activity:  as tolerated Diet:  heart healthy diet Major depressive disorder, recurrent, severe without psychosis: Prozac 40 mg daily Risperdal 2 mg BID, EKG normal sinus rhythm with no abnormalities, TSH WDL, ordered lipid panel and A1C   Anxiety: Hydroxyzine 50 mg TID PRN   Insomnia: Trazodone 100 mg daily at bedtime PRN   Nightmares: Prazosin 1 mg daily at bedtime   Seizures: Keppra 750 mg BID  Nanine Means, NP 01/29/2023, 9:14 AM

## 2023-01-29 NOTE — Discharge Summary (Signed)
Physician Discharge Summary Note  Patient:  Raymond [name] is an 42 y.o., male MRN:  [id number] DOB:  [birth year] Patient phone:  [phone] (home)  Patient address:   [address] [place] [address],  Total Time spent with patient: 45 minutes  Date of Admission:  01/23/2023 Date of Discharge: 01/29/2023  Reason for Admission:  meth use and suicidal ideations with overdose  Principal Problem: Major depressive disorder, recurrent, severe without psychotic behavior (HCC) Discharge Diagnoses: Principal Problem:   Major depressive disorder, recurrent, severe without psychotic behavior (HCC)   Past Psychiatric History: depression, anxiety  Past Medical History:  Past Medical History:  Diagnosis Date   Anxiety    Hepatitis C    PTSD (post-traumatic stress disorder)     Past Surgical History:  Procedure Laterality Date   FACIAL FRACTURE SURGERY     metal plate under right eye   Family History: History reviewed. No pertinent family history. Family Psychiatric  History: none Social History:  Social History   Substance and Sexual Activity  Alcohol Use Not Currently     Social History   Substance and Sexual Activity  Drug Use Not Currently    Social History   Socioeconomic History   Marital status: Single    Spouse name: Not on file   Number of children: Not on file   Years of education: Not on file   Highest education level: Not on file  Occupational History   Not on file  Tobacco Use   Smoking status: Some Days    Types: Cigarettes   Smokeless tobacco: Never  Vaping Use   Vaping status: Never Used  Substance and Sexual Activity   Alcohol use: Not Currently   Drug use: Not Currently   Sexual activity: Not on file  Other Topics Concern   Not on file  Social History Narrative   Not on file   Social Determinants of Health   Financial Resource Strain: Not on File (08/18/2022)   Received from [facility]     Financial Resource Strain: 0  Food Insecurity: Food Insecurity Present (01/23/2023)   Hunger Vital Sign    Worried About Running Out of Food in the Last Year: Often true    Ran Out of Food in the Last Year: Often true  Transportation Needs: Unmet Transportation Needs (01/23/2023)   PRAPARE - Administrator, Civil Service (Medical): Yes    Lack of Transportation (Non-Medical): Yes  Physical Activity: Not on File (08/18/2022)   Received from [place], [place]   Physical Activity    Physical Activity: 0  Stress: Not on File (08/18/2022)   Received from [facility], [place]   Stress    Stress: 0  Social Connections: Not on File (08/18/2022)   Received from [place], [place]   Social Connections    Social Connections and Isolation: 0    Hospital Course:   42 yo male admitted for depression and self-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  He acclimated immediately to the unit and socialized from the beginning without issues.  His biggest issue appears his state of loneliness and desire to "be around friends".  Depression and anxiety resolved quickly with denial of cravings.At discharge, he wanted to stay with his "friends".  Discussed the [facility] that is close to where he lives where he can go and socialize regularly, psychosocial mental health clubhouse.  The tech, Reggie, is familiar with this place and discussed it also.  Leotha has  ACT team interviews in place for a ACt that can further assist him along with his [facility] care team.  Denies suicidal/homicidal ideations, hallucinations, and withdrawal symptoms.  He has met maximum benefit of hospitalization.  Discharge instructions provided along with explanations and Rx, crisis numbers, and follow up appointment information with the [facility].  Musculoskeletal: Strength & Muscle Tone: within normal limits Gait & Station: normal Patient leans: N/A  Psychiatric Specialty Exam: Physical Exam Vitals and nursing note reviewed.  Constitutional:       Appearance: Normal appearance.  HENT:     Head: Normocephalic.     Nose: Nose normal.  Pulmonary:     Effort: Pulmonary effort is normal.  Musculoskeletal:        General: Normal range of motion.     Cervical back: Normal range of motion.  Neurological:     General: No focal deficit present.     Mental Status: He is alert and oriented to person, place, and time.     Review of Systems  All other systems reviewed and are negative.   Blood pressure 120/84, pulse 76, temperature 97.9 F (36.6 C), temperature source Oral, resp. rate (!) 22, height 6' (1.829 m), weight 76.7 kg, SpO2 99%.Body mass index is 22.92 kg/m.  General Appearance: Casual  Eye Contact:  Good  Speech:  Normal Rate  Volume:  Normal  Mood:  Euthymic  Affect:  Appropriate  Thought Process:  Coherent  Orientation:  Full (Time, Place, and Person)  Thought Content:  WDL and Logical  Suicidal Thoughts:  No  Homicidal Thoughts:  No  Memory:  Immediate;   Good Recent;   Good Remote;   Good  Judgement:  Good  Insight:  Good  Psychomotor Activity:  Normal  Concentration:  Concentration: Good and Attention Span: Good  Recall:  Good  Fund of Knowledge:  Good  Language:  Good  Akathisia:  No  Handed:  Right  AIMS (if indicated):     Assets:  Housing Leisure Time Physical Health Resilience Social Support  ADL's:  Intact  Cognition:  WNL  Sleep:        Physical Exam: Physical Exam Vitals and nursing note reviewed.  Constitutional:      Appearance: Normal appearance.  HENT:     Head: Normocephalic.     Nose: Nose normal.  Pulmonary:     Effort: Pulmonary effort is normal.  Musculoskeletal:        General: Normal range of motion.     Cervical back: Normal range of motion.  Neurological:     General: No focal deficit present.     Mental Status: He is alert and oriented to person, place, and time.    Review of Systems  Psychiatric/Behavioral:  Positive for substance abuse.   All other systems  reviewed and are negative.  Blood pressure 120/84, pulse 76, temperature 97.9 F (36.6 C), temperature source Oral, resp. rate (!) 22, height 6' (1.829 m), weight 76.7 kg, SpO2 99%. Body mass index is 22.92 kg/m.   Social History   Tobacco Use  Smoking Status Some Days   Types: Cigarettes  Smokeless Tobacco Never   Tobacco Cessation:  A prescription for an FDA-approved tobacco cessation medication was offered at discharge and the patient refused   Blood Alcohol level:  Lab Results  Component Value Date   [facility] <10 01/22/2023   ETH <10 11/20/2022    Metabolic Disorder Labs:  Lab Results  Component Value Date   HGBA1C 5.5  01/26/2023   MPG 111.15 01/26/2023   MPG 128 10/29/2022   Lab Results  Component Value Date   PROLACTIN 12.0 01/22/2023   PROLACTIN 19.5 11/20/2022   Lab Results  Component Value Date   CHOL 157 01/26/2023   TRIG 309 (H) 01/26/2023   HDL 39 (L) 01/26/2023   CHOLHDL 4.0 01/26/2023   VLDL 62 (H) 01/26/2023   LDLCALC 56 01/26/2023   LDLCALC 93 10/29/2022    See Psychiatric Specialty Exam and Suicide Risk Assessment completed by Attending Physician prior to discharge.  Discharge destination:  Home  Is patient on multiple antipsychotic therapies at discharge:  No   Has Patient had three or more failed trials of antipsychotic monotherapy by history:  No  Recommended Plan for Multiple Antipsychotic Therapies: NA  Discharge Instructions     Diet - low sodium heart healthy   Complete by: As directed    Discharge instructions   Complete by: As directed    Follow up with [facility] and ACT team   Increase activity slowly   Complete by: As directed       Allergies as of 01/29/2023       Reactions   Other Itching, Rash, Other (See Comments)   Peanuts   Tylenol [acetaminophen] Rash        Medication List     TAKE these medications      Indication  albuterol 108 (90 Base) MCG/ACT inhaler Commonly known as: VENTOLIN HFA Inhale 2 puffs into  the lungs every 4 (four) hours as needed for wheezing or shortness of breath.  Indication: Chronic Obstructive Lung Disease   FLUoxetine 40 MG capsule Commonly known as: PROZAC Take 1 capsule (40 mg total) by mouth daily.  Indication: Depression   hydrOXYzine 50 MG tablet Commonly known as: ATARAX Take 1 tablet (50 mg total) by mouth every 6 (six) hours as needed for anxiety.  Indication: Feeling Anxious   levETIRAcetam 750 MG tablet Commonly known as: KEPPRA Take 1 tablet (750 mg total) by mouth every 12 (twelve) hours. What changed:  medication strength how much to take  Indication: Partial Onset Seizure   lisinopril 10 MG tablet Commonly known as: ZESTRIL Take 1 tablet (10 mg total) by mouth daily.  Indication: High Blood Pressure Disorder   nicotine polacrilex 4 MG gum Commonly known as: Nicorette Take 1 each (4 mg total) by mouth 4 (four) times daily as needed for smoking cessation.  Indication: Nicotine Addiction   prazosin 1 MG capsule Commonly known as: MINIPRESS Take 1 capsule (1 mg total) by mouth at bedtime.  Indication: Frightening Dreams   risperiDONE 2 MG tablet Commonly known as: RISPERDAL Take 1 tablet (2 mg total) by mouth 2 (two) times daily.  Indication: Major Depressive Disorder   traZODone 100 MG tablet Commonly known as: DESYREL Take 1 tablet (100 mg total) by mouth at bedtime.  Indication: Trouble Sleeping        Follow-up recommendations:  Activity:  as tolerated Diet:  heart healthy diet Major depressive disorder, recurrent, severe without psychosis: Prozac 40 mg daily Risperdal 2 mg BID   Anxiety: Hydroxyzine 50 mg TID PRN   Insomnia: Trazodone 100 mg daily at bedtime PRN   Nightmares: Prazosin 1 mg daily at bedtime   Seizures: Keppra 1000 mg BID decreased to 750 mg BID  Comments:  follow [facility] with the [facility] and potential ACT team, along with [facility], mental health clubhouse  Signed: Nanine Means, NP 01/29/2023, 9:18  AM

## 2023-01-29 NOTE — Progress Notes (Addendum)
  [facility] Adult Case Management Discharge Plan :  Will you be returning to the same living situation after discharge:  Yes,  reports that he is returning home. At discharge, do you have transportation home?: Yes,  CSW to assist in transportation home. Do you have the ability to pay for your medications: Yes,  BCBS  Release of information consent forms completed and in the chart;  Patient's signature needed at discharge.  Patient to Follow up at:  Follow-up Information     [facility].   Why: Appointment is scheduled for 02/19/2023 at 10:30AM. Contact information: [address] [facility] [place] [address] [phone]         [facility] Follow up.   Why: Please call the [facility] for an intake, this is a requirement for services.  Call Elise Benne, [phone].  She will explain the available services and criteria to receive them.  Call Monday through Friday 8AM to 4PM. Contact information: [address] [place] [address] [phone]         [facility] Follow up.   Why: An ACT team referral has been submitted please follow up on status of referral. Contact information: [address]  [address] [place] [address] [phone]         [facility] Follow up.   Why: An ACT team referral has been submitted please follow up on status of referral. Contact information: [address] [place] [address] [phone]         [facility] [place] [employer] Follow up.   Why: An ACT team referral has been submitted please follow up on status of referral. Contact information: [address] [place] [address] [phone]         [facility] Follow up.   Why: An ACT team referral has been submitted please follow up on status of referral. Contact information: [address] [facility] [address] [phone]              Next  level of care provider has access to [facility] Link:no  Safety Planning and Suicide Prevention discussed: Yes,  SPE completed with the patient. Patient declined collateral contact.     Has patient been referred to the Quitline?: Patient refused referral for treatment  Patient has been referred for addiction treatment: Patient refused referral for treatment.  Harden Mo, LCSW 01/29/2023, 9:55 AM

## 2023-01-29 NOTE — Progress Notes (Signed)
Patient pleasant and cooperative on approach. Denies SI,HI and AVH. Verbalized understanding discharge instructions,prescriptions and follow up care.  All belongings returned from BMU locker. Suicide safety plan filled by patient and placed in chart. Copy given to patient.Patient escorted out by staff and transported by cab. 

## 2023-02-03 ENCOUNTER — Emergency Department (HOSPITAL_COMMUNITY)
Admission: EM | Admit: 2023-02-03 | Discharge: 2023-02-04 | Disposition: A | Discharge status: Home / Self Care | Payer: BLUE CROSS/BLUE SHIELD | Attending: Emergency Medicine | Admitting: Emergency Medicine

## 2023-02-03 ENCOUNTER — Other Ambulatory Visit: Payer: Self-pay

## 2023-02-03 DIAGNOSIS — T50902A Poisoning by unspecified drugs, medicaments and biological substances, intentional self-harm, initial encounter: Secondary | ICD-10-CM | POA: Diagnosis present

## 2023-02-03 DIAGNOSIS — R45851 Suicidal ideations: Secondary | ICD-10-CM | POA: Insufficient documentation

## 2023-02-03 LAB — CBC
HCT: 46.5 % (ref 39.0–52.0)
Hemoglobin: 15.6 g/dL (ref 13.0–17.0)
MCH: 30.8 pg (ref 26.0–34.0)
MCHC: 33.5 g/dL (ref 30.0–36.0)
MCV: 91.7 fL (ref 80.0–100.0)
Platelets: 251 10*3/uL (ref 150–400)
RBC: 5.07 MIL/uL (ref 4.22–5.81)
RDW: 13.1 % (ref 11.5–15.5)
WBC: 7.2 10*3/uL (ref 4.0–10.5)
nRBC: 0 % (ref 0.0–0.2)

## 2023-02-03 LAB — RAPID URINE DRUG SCREEN, HOSP PERFORMED
Amphetamines: NOT DETECTED
Barbiturates: NOT DETECTED
Benzodiazepines: NOT DETECTED
Cocaine: NOT DETECTED
Opiates: NOT DETECTED
Tetrahydrocannabinol: POSITIVE — AB

## 2023-02-03 LAB — COMPREHENSIVE METABOLIC PANEL
ALT: 51 U/L — ABNORMAL HIGH (ref 0–44)
AST: 30 U/L (ref 15–41)
Albumin: 4.4 g/dL (ref 3.5–5.0)
Alkaline Phosphatase: 108 U/L (ref 38–126)
Anion gap: 13 (ref 5–15)
BUN: 15 mg/dL (ref 6–20)
CO2: 17 mmol/L — ABNORMAL LOW (ref 22–32)
Calcium: 9 mg/dL (ref 8.9–10.3)
Chloride: 105 mmol/L (ref 98–111)
Creatinine, Ser: 0.75 mg/dL (ref 0.61–1.24)
GFR, Estimated: >60 mL/min (ref >60)
Glucose, Bld: 93 mg/dL (ref 70–99)
Potassium: 3.5 mmol/L (ref 3.5–5.1)
Sodium: 135 mmol/L (ref 135–145)
Total Bilirubin: 0.9 mg/dL (ref 0.3–1.2)
Total Protein: 7.9 g/dL (ref 6.5–8.1)

## 2023-02-03 LAB — SALICYLATE LEVEL: Salicylate Lvl: <7 mg/dL — ABNORMAL LOW (ref 7.0–30.0)

## 2023-02-03 LAB — ETHANOL: Alcohol, Ethyl (B): <10 mg/dL (ref <10)

## 2023-02-03 LAB — ACETAMINOPHEN LEVEL: Acetaminophen (Tylenol), Serum: <10 ug/mL — ABNORMAL LOW (ref 10–30)

## 2023-02-03 MED ORDER — PRAZOSIN HCL 1 MG PO CAPS: 1.0000 mg | ORAL_CAPSULE | Freq: Every day | ORAL | Status: DC

## 2023-02-03 MED ORDER — FLUOXETINE HCL 20 MG PO CAPS
40.0000 mg | ORAL_CAPSULE | Freq: Every day | ORAL | Status: DC
  Administered 2023-02-04: 40 mg via ORAL
  Filled 2023-02-03: qty 2

## 2023-02-03 MED ORDER — LISINOPRIL 10 MG PO TABS
10.0000 mg | ORAL_TABLET | Freq: Every day | ORAL | Status: DC
  Administered 2023-02-04: 10 mg via ORAL
  Filled 2023-02-03: qty 1

## 2023-02-03 MED ORDER — ALBUTEROL SULFATE HFA 108 (90 BASE) MCG/ACT IN AERS: 2.0000 | INHALATION_SPRAY | RESPIRATORY_TRACT | Status: DC | PRN

## 2023-02-03 MED ORDER — NICOTINE POLACRILEX 2 MG MT GUM: 4.0000 mg | CHEWING_GUM | Freq: Four times a day (QID) | OROMUCOSAL | Status: DC | PRN

## 2023-02-03 MED ORDER — HYDROXYZINE HCL 25 MG PO TABS: 50.0000 mg | ORAL_TABLET | Freq: Four times a day (QID) | ORAL | Status: DC | PRN

## 2023-02-03 MED ORDER — RISPERIDONE 2 MG PO TABS
2.0000 mg | ORAL_TABLET | Freq: Two times a day (BID) | ORAL | Status: DC
  Administered 2023-02-04: 2 mg via ORAL
  Filled 2023-02-03: qty 1

## 2023-02-03 MED ORDER — TRAZODONE HCL 100 MG PO TABS: 100.0000 mg | ORAL_TABLET | Freq: Every day | ORAL | Status: DC

## 2023-02-03 NOTE — ED Provider Notes (Addendum)
[place] EMERGENCY DEPARTMENT AT [facility] Provider Note   CSN: [id number] Arrival date & time: 02/03/23  2036     History  Chief Complaint  Patient presents with   Suicidal    Raymond [name] is a 42 y.o. male.  42 year old male presents after a suicide attempt earlier this morning.  States that he took 4 or 5 extra Keppra tablets and then had emesis afterwards.  States he is chronically suicidal but this is worse currently due to his loss of his father who passed 2 years ago.  He also states that he has been using marijuana and cocaine methamphetamine.  States that he leaves here he will harm himself.  Dors is increased anxiety as well.  Denies any use of alcohol       Home Medications Prior to Admission medications   Medication Sig Start Date End Date Taking? Authorizing Provider  albuterol (VENTOLIN HFA) 108 (90 Base) MCG/ACT inhaler Inhale 2 puffs into the lungs every 4 (four) hours as needed for wheezing or shortness of breath. 10/16/22   Clapacs, Jackquline Denmark, MD  FLUoxetine (PROZAC) 40 MG capsule Take 1 capsule (40 mg total) by mouth daily. 11/08/22   Rex Kras, MD  hydrOXYzine (ATARAX) 50 MG tablet Take 1 tablet (50 mg total) by mouth every 6 (six) hours as needed for anxiety. 10/16/22   Clapacs, Jackquline Denmark, MD  levETIRAcetam (KEPPRA) 750 MG tablet Take 1 tablet (750 mg total) by mouth every 12 (twelve) hours. 01/29/23 02/28/23  Charm Rings, NP  lisinopril (ZESTRIL) 10 MG tablet Take 1 tablet (10 mg total) by mouth daily. 10/17/22   Clapacs, Jackquline Denmark, MD  nicotine polacrilex (NICORETTE) 4 MG gum Take 1 each (4 mg total) by mouth 4 (four) times daily as needed for smoking cessation. 11/21/22   Lenard Lance, FNP  prazosin (MINIPRESS) 1 MG capsule Take 1 capsule (1 mg total) by mouth at bedtime. 11/07/22   Rex Kras, MD  risperiDONE (RISPERDAL) 2 MG tablet Take 1 tablet (2 mg total) by mouth 2 (two) times daily. 11/07/22   Rex Kras, MD  traZODone (DESYREL) 100 MG  tablet Take 1 tablet (100 mg total) by mouth at bedtime. 11/07/22   Rex Kras, MD      Allergies    Other and Tylenol [acetaminophen]    Review of Systems   Review of Systems  All other systems reviewed and are negative.   Physical Exam Updated Vital Signs BP (!) 153/99   Pulse 75   Temp 98 F (36.7 C)   Resp 16   Ht 1.829 m (6')   Wt 76.7 kg   SpO2 99%   BMI 22.93 kg/m  Physical Exam Vitals and nursing note reviewed.  Constitutional:      General: He is not in acute distress.    Appearance: Normal appearance. He is well-developed. He is not toxic-appearing.  HENT:     Head: Normocephalic and atraumatic.  Eyes:     General: Lids are normal.     Conjunctiva/sclera: Conjunctivae normal.     Pupils: Pupils are equal, round, and reactive to light.  Neck:     Thyroid: No thyroid mass.     Trachea: No tracheal deviation.  Cardiovascular:     Rate and Rhythm: Normal rate and regular rhythm.     Heart sounds: Normal heart sounds. No murmur heard.    No gallop.  Pulmonary:     Effort: Pulmonary effort is normal. No respiratory  distress.     Breath sounds: Normal breath sounds. No stridor. No decreased breath sounds, wheezing, rhonchi or rales.  Abdominal:     General: There is no distension.     Palpations: Abdomen is soft.     Tenderness: There is no abdominal tenderness. There is no rebound.  Musculoskeletal:        General: No tenderness. Normal range of motion.     Cervical back: Normal range of motion and neck supple.  Skin:    General: Skin is warm and dry.     Findings: No abrasion or rash.  Neurological:     Mental Status: He is alert and oriented to person, place, and time. Mental status is at baseline.     GCS: GCS eye subscore is 4. GCS verbal subscore is 5. GCS motor subscore is 6.     Cranial Nerves: Cranial nerves are intact. No cranial nerve deficit.     Sensory: No sensory deficit.     Motor: Motor function is intact.  Psychiatric:         Attention and Perception: Attention normal.        Mood and Affect: Affect is flat.        Speech: Speech normal.        Behavior: Behavior is withdrawn.        Thought Content: Thought content includes suicidal ideation. Thought content includes suicidal plan.     ED Results / Procedures / Treatments   Labs (all labs ordered are listed, but only abnormal results are displayed) Labs Reviewed  COMPREHENSIVE METABOLIC PANEL  ETHANOL  SALICYLATE LEVEL  ACETAMINOPHEN LEVEL  CBC  RAPID URINE DRUG SCREEN, HOSP PERFORMED    EKG EKG Interpretation Date/Time:  Tuesday February 03 2023 21:49:21 EDT Ventricular Rate:  67 PR Interval:  164 QRS Duration:  102 QT Interval:  414 QTC Calculation: 437 R Axis:   9  Text Interpretation: Normal sinus rhythm with sinus arrhythmia Incomplete right bundle branch block Borderline ECG When compared with ECG of 23-Jan-2023 08:48, PREVIOUS ECG IS PRESENT Confirmed by Lorre Nick ([id number]) on 02/03/2023 9:55:35 PM  Radiology No results found.  Procedures Procedures    Medications Ordered in ED Medications - No data to display  ED Course/ Medical Decision Making/ A&P                                 Medical Decision Making Amount and/or Complexity of Data Reviewed Labs: ordered. ECG/medicine tests: ordered.  Risk OTC drugs. Prescription drug management.   Patient is EKG shows normal sinus rhythm.  No signs of QT prolongation.  Labs here are reassuring.  Patient took the Keppra over 15 hours ago.  States that he had emesis directly after he took the pills and had some pill fragments.  Home meds reordered except for Keppra.  He is medically clear for psychiatric disposition  He will likely need to have his Keppra restarted in the morning      Final Clinical Impression(s) / ED Diagnoses Final diagnoses:  None    Rx / DC Orders ED Discharge Orders     None         Lorre Nick, MD 02/03/23 2156    Lorre Nick,  MD 02/03/23 2156

## 2023-02-03 NOTE — ED Triage Notes (Signed)
Patient BIB EMS C/o SI. Patient stated he plans on overdose with his home medication. Patient denies taking medication tonight. Patient denies HI. Patient calm and cooperative at this time.  Bp 158/100 HR 76 RR 20 O2sat 98% on RA CBG 74

## 2023-02-03 NOTE — BH Assessment (Signed)
TTS clinician attempted to complete assessment. Junior, RN, patient asleep unable to arouse. TTS consult will be completed at later time.

## 2023-02-04 DIAGNOSIS — R45851 Suicidal ideations: Secondary | ICD-10-CM

## 2023-02-04 NOTE — Discharge Instructions (Addendum)
Follow up at previously scheduled appointment at the [facility] on 02/19/2023

## 2023-02-04 NOTE — ED Provider Notes (Addendum)
  Physical Exam  BP (!) 134/94 (BP Location: Left Arm)   Pulse 68   Temp 98.2 F (36.8 C) (Oral)   Resp 16   Ht 6' (1.829 m)   Wt 76.7 kg   SpO2 98%   BMI 22.93 kg/m   Physical Exam  Procedures  Procedures  ED Course / MDM    Medical Decision Making Amount and/or Complexity of Data Reviewed Labs: ordered. ECG/medicine tests: ordered.  Risk OTC drugs. Prescription drug management.   Patient pending psychiatric placement.  Overdose on his Keppra yesterday.  Will hold off on Keppra for right now.  Still should have enough in his system.  Patient is been seen by psychiatry and cleared for discharge.  Resources given.       Benjiman Core, MD 02/04/23 7425    Benjiman Core, MD 02/04/23 1108

## 2023-02-04 NOTE — ED Notes (Signed)
Patient discharged off unit to home per provider. Patient alert, cooperative, no s/s of distress at this time. Discharge information given to patient, with acknowledged understanding.  Belongings given to patient. Patient ambulated off unit, escorted by NT. Patient transported by [employer].

## 2023-02-04 NOTE — Consult Note (Signed)
[facility] ED ASSESSMENT   Reason for Consult:  Psych consult Referring Physician:  Lorre Nick Patient Identification: Raymond [name] MRN:  [id number] ED Chief Complaint: Suicidal ideation  Diagnosis:  Principal Problem:   Suicidal ideation   ED Assessment Time Calculation: Start Time: 0800 Stop Time: 0900 Total Time in Minutes (Assessment Completion): 60  HPI:  Jefferson Hebeler is a 42 y.o. male patient was brought in by EMS for complaints of suicidal ideation. He has a history of suicidal ideation, MDD, polysubstance abuse, PTSD, homelessness, TBI and seizure disorder.  Subjective:   Malkiel Flagg is a 42 y.o. male patient was brought in by EMS for complaints of suicidal ideation. He has a history of suicidal ideation, MDD, polysubstance abuse, PTSD, homelessness, TBI and seizure disorder.   [name], 42 y.o., male patient seen face to face by this provider, consulted with Dr. Lucianne Muss; and chart reviewed on 02/04/23.  On evaluation Lavern Langlitz reports he "doesn't feel good" and clarifies that his "stomach hurts."  Patient said that he used some marijuana that had cocaine and meth added to it.  This caused a panic attack which then caused him to feel suicidal.  Patient is no longer under the influence of these substances and he now says "I just like being in the hospital because I like having someone to talk to" and he says he gets his medications refilled by "just coming to the hospital and they refill them when I leave."  In the discharge summary from 01/23/23 it says "He acclimated immediately to the unit and socialized from the beginning without issues. His biggest issue appears his state of loneliness and desire to "be around friends.""   Patient has had multiple previous admissions and [facility]   During evaluation Dyshaun Ouderkirk is seated in bed in no acute distress.  He is alert, oriented x 4, calm, cooperative and attentive.  His mood is euthymic with congruent affect.  He has normal speech, and behavior.  Objectively there is no evidence of psychosis/mania or delusional thinking.  Patient is able to converse coherently, goal directed thoughts, no distractibility, or pre-occupation.  He also denies homicidal ideation, psychosis, and paranoia.  Patient answered question appropriately.    This patient has a significant history of malingering evident by multiple emergency room (ED), urgent care (UC) visits, and psychiatric hospitalizations with similar complaints of suicidal ideation.  Patient has been observed overnight in the ED and there has been no unsafe behavior observed.  Patients' similar complaint of suicidal ideation is often resolved once housing or other need has been arranged for him.  While on unit he demonstrates no unsafe behavior and there is no psychiatric condition which is amendable to inpatient psychiatric hospitalization.  Patients' suicidal ideation is conditional and used as a means of manipulation and/or attention seeking without true intention to harm himself.  His behaviors are more consistent with someone who is acting in self-preservation, rather that someone who is acutely suicidal.  Given all that is stated above including the fact that the patient shows no signs of mania or psychosis, has a liner, coherent thought process throughout assessment the patient does not meet criteria for inpatient psychiatric hospitalization or further psychiatric evaluation at this time.  Patient would benefit from outpatient psychiatric services; he has a pending appointment at this time and additional resources will be provided along with community resources prior to discharge.    Patient ongoing endorsement of suicidal  ideation shows clear evidence of secondary gain of unmet needs that is representative of limited and often- maladaptive coping skills and rather than an indicator of imminent risk of death.  Evidence  indicates that subsequent suicide attempts by patients who made contingent suicide threats (defined as threatened suicide or exaggerated suicidality) are uncommon in both groups.  Hospitalization should not be used as a substitute for social services, substance abuse treatment, and legal assistance for patients who make contingent suicide threats (Characteristics and six-month outcome of patients who use suicide threats to seek hospital admission.  (1966). Psychiatric Services, 47(8), 8786889251. (DOI: 10.1176/ps.47.8.871).    Patient has not benefited from past hospitalization under similar circumstances in terms of suicide risk modification, improvement in mental health, or social conditions.  Patients repeated use of ED and psychiatric hospitalization services instead of recommended outpatient follow-up is ineffective in helping patients' improvement.  Psychiatric hospitalization is not indicated, and the patient's refusal of interventions that have been offered by clinical care teams during prior stays in the ED and during psychiatric hospitalization to mitigate risk of self-harm, other than allowing care team to observe to sobriety or behavior.  This provider and Dr. Lucianne Muss have discussed assessment and treatment recommendations with patient.  Further we see no evidence of severe psychosis, cognitive impairment, intoxication, or other condition that prevents the patient from acting under their own choice and volition.     Past Psychiatric History: multiple previous inpatient hospitalizations, polysubstance abuse, TBI, MDD, PTSD  Risk to Self or Others: Is the patient at risk to self? No Has the patient been a risk to self in the past 6 months? No Has the patient been a risk to self within the distant past? No Is the patient a risk to others? No Has the patient been a risk to others in the past 6 months? No Has the patient been a risk to others within the distant past? No  [place] Scale:  Flowsheet  Row ED from 02/03/2023 in [facility] Department at [facility] Admission (Discharged) from 01/23/2023 in [facility] INPATIENT BE[facility]IORAL MEDICINE ED from 01/22/2023 in [facility]  C-SSRS RISK CATEGORY Moderate Risk High Risk High Risk        Substance Abuse:   Polysubstance abuse  Past Medical History:  Past Medical History:  Diagnosis Date   Anxiety    Hepatitis C    PTSD (post-traumatic stress disorder)     Past Surgical History:  Procedure Laterality Date   FACIAL FRACTURE SURGERY     metal plate under right eye   Family History: No family history on file. Family Psychiatric  History: None noted Social History:  Social History   Substance and Sexual Activity  Alcohol Use Not Currently     Social History   Substance and Sexual Activity  Drug Use Not Currently    Social History   Socioeconomic History   Marital status: Single    Spouse name: Not on file   Number of children: Not on file   Years of education: Not on file   Highest education level: Not on file  Occupational History   Not on file  Tobacco Use   Smoking status: Some Days    Types: Cigarettes   Smokeless tobacco: Never  Vaping Use   Vaping status: Never Used  Substance and Sexual Activity   Alcohol use: Not Currently   Drug use: Not Currently   Sexual activity: Not on file  Other Topics Concern  Not on file  Social History Narrative   Not on file   Social Determinants of Health   Financial Resource Strain: Not on File (08/18/2022)   Received from [employer] [place] Strain    Financial Resource Strain: 0  Food Insecurity: Food Insecurity Present (01/23/2023)   Hunger Vital Sign    Worried About Running Out of Food in the Last Year: Often true    Ran Out of Food in the Last Year: Often true  Transportation Needs: Unmet Transportation Needs (01/23/2023)   PRAPARE - Administrator, Civil Service (Medical): Yes    Lack of  Transportation (Non-Medical): Yes  Physical Activity: Not on File (08/18/2022)   Received from [place], [place]   Physical Activity    Physical Activity: 0  Stress: Not on File (08/18/2022)   Received from [facility], [place]   Stress    Stress: 0  Social Connections: Not on File (08/18/2022)   Received from [place], [place]   Social Connections    Social Connections and Isolation: 0   Additional Social History: Patient states he lives alone   Allergies:   Allergies  Allergen Reactions   Other Itching, Rash and Other (See Comments)    Peanuts   Tylenol [Acetaminophen] Rash    Labs:  Results for orders placed or performed during the hospital encounter of 02/03/23 (from the past 48 hour(s))  Comprehensive metabolic panel     Status: Abnormal   Collection Time: 02/03/23  9:04 PM  Result Value Ref Range   Sodium 135 135 - 145 mmol/L   Potassium 3.5 3.5 - 5.1 mmol/L   Chloride 105 98 - 111 mmol/L   CO2 17 (L) 22 - 32 mmol/L   Glucose, Bld 93 70 - 99 mg/dL    Comment: Glucose reference range applies only to samples taken after fasting for at least 8 hours.   BUN 15 6 - 20 mg/dL   Creatinine, Ser 4.09 0.61 - 1.24 mg/dL   Calcium 9.0 8.9 - 81.1 mg/dL   Total Protein 7.9 6.5 - 8.1 g/dL   Albumin 4.4 3.5 - 5.0 g/dL   AST 30 15 - 41 U/L   ALT 51 (H) 0 - 44 U/L   Alkaline Phosphatase 108 38 - 126 U/L   Total Bilirubin 0.9 0.3 - 1.2 mg/dL   GFR, Estimated >91 >47 mL/min    Comment: (NOTE) Calculated using the CKD-EPI Creatinine Equation (2021)    Anion gap 13 5 - 15    Comment: Performed at [facility], [address] [place], [place] [address]  Ethanol     Status: None   Collection Time: 02/03/23  9:04 PM  Result Value Ref Range   Alcohol, Ethyl (B) <10 <10 mg/dL    Comment: (NOTE) Lowest detectable limit for serum alcohol is 10 mg/dL.  For medical purposes only. Performed at [facility], [address] [place], [place] [address]   Salicylate  level     Status: Abnormal   Collection Time: 02/03/23  9:04 PM  Result Value Ref Range   Salicylate Lvl <7.0 (L) 7.0 - 30.0 mg/dL    Comment: Performed at [facility], [address], [place], [place] [address]  Acetaminophen level     Status: Abnormal   Collection Time: 02/03/23  9:04 PM  Result Value Ref Range   Acetaminophen (Tylenol), Serum <10 (L) 10 - 30 ug/mL    Comment: (NOTE) Therapeutic concentrations vary  significantly. A range of 10-30 ug/mL  may be an effective concentration for many patients. However, some  are best treated at concentrations outside of this range. Acetaminophen concentrations >150 ug/mL at 4 hours after ingestion  and >50 ug/mL at 12 hours after ingestion are often associated with  toxic reactions.  Performed at [facility], [address], [place], [place] [address]   cbc     Status: None   Collection Time: 02/03/23  9:04 PM  Result Value Ref Range   WBC 7.2 4.0 - 10.5 K/uL   RBC 5.07 4.22 - 5.81 MIL/uL   Hemoglobin 15.6 13.0 - 17.0 g/dL   HCT 15.1 76.1 - 60.7 %   MCV 91.7 80.0 - 100.0 fL   MCH 30.8 26.0 - 34.0 pg   MCHC 33.5 30.0 - 36.0 g/dL   RDW 37.1 06.2 - 69.4 %   Platelets 251 150 - 400 K/uL   nRBC 0.0 0.0 - 0.2 %    Comment: Performed at [facility], [address], [place], [place] [address]  Rapid urine drug screen (hospital performed)     Status: Abnormal   Collection Time: 02/03/23  9:13 PM  Result Value Ref Range   Opiates NONE DETECTED NONE DETECTED   Cocaine NONE DETECTED NONE DETECTED   Benzodiazepines NONE DETECTED NONE DETECTED   Amphetamines NONE DETECTED NONE DETECTED   Tetrahydrocannabinol POSITIVE (A) NONE DETECTED   Barbiturates NONE DETECTED NONE DETECTED    Comment: (NOTE) DRUG SCREEN FOR MEDICAL PURPOSES ONLY.  IF CONFIRMATION IS NEEDED FOR ANY PURPOSE, NOTIFY LAB WITHIN 5 DAYS.  LOWEST DETECTABLE LIMITS FOR URINE DRUG SCREEN Drug Class                      Cutoff (ng/mL) Amphetamine and metabolites    1000 Barbiturate and metabolites    200 Benzodiazepine                 200 Opiates and metabolites        300 Cocaine and metabolites        300 THC                            50 Performed at [facility], [address], [place], [place] [address]     Current Facility-Administered Medications  Medication Dose Route Frequency Provider Last Rate Last Admin   albuterol (VENTOLIN HFA) 108 (90 Base) MCG/ACT inhaler 2 puff  2 puff Inhalation Q4H PRN Lorre Nick, MD       FLUoxetine (PROZAC) capsule 40 mg  40 mg Oral Daily Lorre Nick, MD   40 mg at 02/04/23 0947   hydrOXYzine (ATARAX) tablet 50 mg  50 mg Oral Q6H PRN Lorre Nick, MD       lisinopril (ZESTRIL) tablet 10 mg  10 mg Oral Daily Lorre Nick, MD   10 mg at 02/04/23 0938   nicotine polacrilex (NICORETTE) gum 4 mg  4 mg Oral QID PRN Lorre Nick, MD       prazosin (MINIPRESS) capsule 1 mg  1 mg Oral QHS Lorre Nick, MD       risperiDONE (RISPERDAL) tablet 2 mg  2 mg Oral BID Lorre Nick, MD   2 mg at 02/04/23 0947   traZODone (DESYREL) tablet 100 mg  100 mg Oral QHS Lorre Nick, MD       Current Outpatient Medications  Medication  Sig Dispense Refill   albuterol (VENTOLIN HFA) 108 (90 Base) MCG/ACT inhaler Inhale 2 puffs into the lungs every 4 (four) hours as needed for wheezing or shortness of breath. 6.7 g 0   FLUoxetine (PROZAC) 40 MG capsule Take 1 capsule (40 mg total) by mouth daily. 30 capsule 0   hydrOXYzine (ATARAX) 50 MG tablet Take 1 tablet (50 mg total) by mouth every 6 (six) hours as needed for anxiety. 10 tablet 0   levETIRAcetam (KEPPRA) 750 MG tablet Take 1 tablet (750 mg total) by mouth every 12 (twelve) hours. 60 tablet 0   lisinopril (ZESTRIL) 10 MG tablet Take 1 tablet (10 mg total) by mouth daily. 10 tablet 0   nicotine polacrilex (NICORETTE) 4 MG gum Take 1 each (4 mg total) by mouth 4 (four) times daily as needed for  smoking cessation. 100 tablet 0   prazosin (MINIPRESS) 1 MG capsule Take 1 capsule (1 mg total) by mouth at bedtime. 30 capsule 0   risperiDONE (RISPERDAL) 2 MG tablet Take 1 tablet (2 mg total) by mouth 2 (two) times daily. 60 tablet 0   traZODone (DESYREL) 100 MG tablet Take 1 tablet (100 mg total) by mouth at bedtime. 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)  Speech Volume: Normal  Handedness: Right   Mood and Affect  Mood: Euthymic  Affect: Congruent   Thought Process  Thought Processes: Coherent  Descriptions of Associations:Intact  Orientation:Full (Time, Place and Person)  Thought Content:WDL  History of Schizophrenia/Schizoaffective disorder:No  Duration of Psychotic Symptoms:No data recorded Hallucinations:Hallucinations: None  Ideas of Reference:None  Suicidal Thoughts:Suicidal Thoughts: Yes, Passive SI Passive Intent and/or Plan: Without Intent  Homicidal Thoughts:Homicidal Thoughts: No   Sensorium  Memory: Immediate Good; Recent Fair; Remote Fair  Judgment: Impaired  Insight: Lacking   Executive Functions  Concentration: Good  Attention Span: Good  Recall: Good  Fund of Knowledge: Good  Language: Good   Psychomotor Activity  Psychomotor Activity: Psychomotor Activity: Normal   Assets  Assets: Communication Skills; Housing; Leisure Time    Sleep  Sleep: Sleep: Good Number of Hours of Sleep: 8   Physical Exam: Physical Exam Vitals and nursing note reviewed.  Eyes:     Pupils: Pupils are equal, round, and reactive to light.  Pulmonary:     Effort: Pulmonary effort is normal.  Skin:    General:  Skin is dry.  Neurological:     Mental Status: He is alert and oriented to person, place, and time.    Review of Systems  Psychiatric/Behavioral:  Positive for substance abuse.   All other systems reviewed and are negative.  Blood pressure (!) 134/94, pulse 68, temperature 98.2 F (36.8 C), temperature source Oral, resp. rate 16, height 6' (1.829 m), weight 76.7 kg, SpO2 98%. Body mass index is 22.93 kg/m.  Medical Decision Making: Patient case reviewed and discussed with Dr Lucianne Muss.  Patient is malingering and is not a danger to himself or others.  He does not meet inpatient criteria at this time and he [facility].  Patient is psychiatrically cleared.  Problem 1: Suicidal ideation -Chronic and passive -Recommend attending currently  [facility] with  -Continue current medications   Disposition:  Patient is psychiatrically cleared  Thomes Lolling, NP 02/04/2023 9:59 AM

## 2023-02-04 NOTE — ED Notes (Signed)
Patient dont want to wake up for TTS consult.  RN tried to wake patient up. Patient just pull his blanket and ignore RN. Will try again later.

## 2023-02-04 NOTE — ED Notes (Signed)
Pt. Was given a breakfast sandwich and a ginger ale

## 2023-02-04 NOTE — ED Notes (Signed)
Breakfast tray was given.  

## 2023-03-26 ENCOUNTER — Emergency Department (HOSPITAL_COMMUNITY): Payer: BLUE CROSS/BLUE SHIELD

## 2023-03-26 ENCOUNTER — Encounter (HOSPITAL_COMMUNITY): Payer: Self-pay

## 2023-03-26 ENCOUNTER — Emergency Department (HOSPITAL_COMMUNITY)
Admission: EM | Admit: 2023-03-26 | Discharge: 2023-03-27 | Disposition: A | Discharge status: Other Acute Inpatient Hospital | Payer: BLUE CROSS/BLUE SHIELD | Attending: Emergency Medicine | Admitting: Emergency Medicine

## 2023-03-26 ENCOUNTER — Other Ambulatory Visit: Payer: Self-pay

## 2023-03-26 DIAGNOSIS — S00412A Abrasion of left ear, initial encounter: Secondary | ICD-10-CM | POA: Insufficient documentation

## 2023-03-26 DIAGNOSIS — W19XXXA Unspecified fall, initial encounter: Secondary | ICD-10-CM | POA: Diagnosis not present

## 2023-03-26 DIAGNOSIS — Z5900 Homelessness unspecified: Secondary | ICD-10-CM | POA: Insufficient documentation

## 2023-03-26 DIAGNOSIS — F17210 Nicotine dependence, cigarettes, uncomplicated: Secondary | ICD-10-CM | POA: Insufficient documentation

## 2023-03-26 DIAGNOSIS — F32A Depression, unspecified: Secondary | ICD-10-CM | POA: Insufficient documentation

## 2023-03-26 DIAGNOSIS — S0991XA Unspecified injury of ear, initial encounter: Secondary | ICD-10-CM | POA: Diagnosis present

## 2023-03-26 DIAGNOSIS — R45851 Suicidal ideations: Secondary | ICD-10-CM

## 2023-03-26 LAB — COMPREHENSIVE METABOLIC PANEL
ALT: 39 U/L (ref 0–44)
AST: 47 U/L — ABNORMAL HIGH (ref 15–41)
Albumin: 4.5 g/dL (ref 3.5–5.0)
Alkaline Phosphatase: 73 U/L (ref 38–126)
Anion gap: 12 (ref 5–15)
BUN: 6 mg/dL (ref 6–20)
CO2: 20 mmol/L — ABNORMAL LOW (ref 22–32)
Calcium: 8.9 mg/dL (ref 8.9–10.3)
Chloride: 110 mmol/L (ref 98–111)
Creatinine, Ser: 0.71 mg/dL (ref 0.61–1.24)
GFR, Estimated: >60 mL/min (ref >60)
Glucose, Bld: 88 mg/dL (ref 70–99)
Potassium: 3.6 mmol/L (ref 3.5–5.1)
Sodium: 142 mmol/L (ref 135–145)
Total Bilirubin: 0.5 mg/dL (ref 0.3–1.2)
Total Protein: 7.6 g/dL (ref 6.5–8.1)

## 2023-03-26 LAB — RAPID URINE DRUG SCREEN, HOSP PERFORMED
Amphetamines: POSITIVE — AB
Barbiturates: NOT DETECTED
Benzodiazepines: NOT DETECTED
Cocaine: NOT DETECTED
Opiates: NOT DETECTED
Tetrahydrocannabinol: POSITIVE — AB

## 2023-03-26 LAB — CBC
HCT: 45.1 % (ref 39.0–52.0)
Hemoglobin: 15.3 g/dL (ref 13.0–17.0)
MCH: 31.4 pg (ref 26.0–34.0)
MCHC: 33.9 g/dL (ref 30.0–36.0)
MCV: 92.4 fL (ref 80.0–100.0)
Platelets: 265 10*3/uL (ref 150–400)
RBC: 4.88 MIL/uL (ref 4.22–5.81)
RDW: 13.7 % (ref 11.5–15.5)
WBC: 5.6 10*3/uL (ref 4.0–10.5)
nRBC: 0 % (ref 0.0–0.2)

## 2023-03-26 LAB — SALICYLATE LEVEL: Salicylate Lvl: <7 mg/dL — ABNORMAL LOW (ref 7.0–30.0)

## 2023-03-26 LAB — ETHANOL: Alcohol, Ethyl (B): 292 mg/dL — ABNORMAL HIGH (ref <10)

## 2023-03-26 LAB — ACETAMINOPHEN LEVEL: Acetaminophen (Tylenol), Serum: <10 ug/mL — ABNORMAL LOW (ref 10–30)

## 2023-03-26 MED ORDER — LORAZEPAM 1 MG PO TABS: 0.0000 mg | ORAL_TABLET | Freq: Two times a day (BID) | ORAL | Status: DC

## 2023-03-26 MED ORDER — THIAMINE MONONITRATE 100 MG PO TABS
100.0000 mg | ORAL_TABLET | Freq: Every day | ORAL | Status: DC
Start: 2023-03-27 — End: 2023-03-27
  Administered 2023-03-27: 100 mg via ORAL
  Filled 2023-03-26: qty 1

## 2023-03-26 MED ORDER — THIAMINE HCL 100 MG/ML IJ SOLN
100.0000 mg | Freq: Every day | INTRAMUSCULAR | Status: DC
Start: 2023-03-27 — End: 2023-03-27
  Filled 2023-03-26: qty 2

## 2023-03-26 MED ORDER — LORAZEPAM 2 MG/ML IJ SOLN: 0.0000 mg | Freq: Four times a day (QID) | INTRAMUSCULAR | Status: DC

## 2023-03-26 MED ORDER — LORAZEPAM 1 MG PO TABS
0.0000 mg | ORAL_TABLET | Freq: Four times a day (QID) | ORAL | Status: DC
  Administered 2023-03-26: 2 mg via ORAL
  Administered 2023-03-27: 1 mg via ORAL
  Filled 2023-03-26: qty 1
  Filled 2023-03-26: qty 2

## 2023-03-26 MED ORDER — LORAZEPAM 2 MG/ML IJ SOLN: 0.0000 mg | Freq: Two times a day (BID) | INTRAMUSCULAR | Status: DC

## 2023-03-26 NOTE — ED Notes (Signed)
Unable to get EKG at the patient patient going to CT.  I will perform when patient return.

## 2023-03-26 NOTE — ED Provider Notes (Signed)
[place] EMERGENCY DEPARTMENT AT [facility] Provider Note  CSN: [id number] Arrival date & time: 03/26/23 1828  Chief Complaint(s) Suicidal  HPI Raymond [name] is a 42 y.o. male history of depression, polysubstance abuse, homelessness, prior suicide attempt presenting to the emergency department with fall  He also reports that he was drinking alcohol.  Patient is in a c-collar and has an abrasion on his ear, he initially denied falling but he was brought in by EMS and they put a c-collar on him, discussed this with the patient and he does admit that he did fall.  Denies any chest pain, shortness of breath, abdominal pain, fevers or chills, leg swelling, nausea or vomiting.  Reports he is also depressed because he is diagnosed with lung cancer.  Denies any ingestions like Tylenol or aspirin.  Patient initially reported taking 10 Keppra pills however later admitted that this is not true.  He reports he cannot afford to have it filled.  On review it looks that he has not filled it since August. 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 Authorizing Provider  albuterol (VENTOLIN HFA) 108 (90 Base) MCG/ACT inhaler Inhale 2 puffs into the lungs every 4 (four) hours as needed for wheezing or shortness of breath. 10/16/22   Clapacs, Jackquline Denmark, MD  FLUoxetine (PROZAC) 40 MG capsule Take 1 capsule (40 mg total) by mouth  daily. Patient not taking: Reported on 02/04/2023 11/08/22   Rex Kras, MD  hydrOXYzine (ATARAX) 50 MG tablet Take 1 tablet (50 mg total) by mouth every 6 (six) hours as needed for anxiety. 10/16/22   Clapacs, Jackquline Denmark, MD  levETIRAcetam (KEPPRA) 750 MG tablet Take 1 tablet (750 mg total) by mouth every 12 (twelve) hours. 01/29/23 02/28/23  Charm Rings, NP  lisinopril (ZESTRIL) 10 MG tablet Take 1 tablet (10 mg total) by mouth daily. Patient not taking: Reported on 02/04/2023 10/17/22   Clapacs, Jackquline Denmark, MD  nicotine polacrilex (NICORETTE) 4 MG gum Take 1 each (4 mg total) by mouth 4 (four) times daily as needed for smoking cessation. 11/21/22   Lenard Lance, FNP  prazosin (MINIPRESS) 1 MG capsule Take 1 capsule (1 mg total) by mouth at bedtime. Patient not taking: Reported on 02/04/2023 11/07/22   Rex Kras, MD  risperiDONE (RISPERDAL) 2 MG tablet Take 1 tablet (2 mg total) by mouth 2 (two) times daily. 11/07/22   Rex Kras, MD  traZODone (DESYREL) 100 MG tablet Take 1 tablet (100 mg total) by mouth at bedtime. 11/07/22   Rex Kras, MD  Past Surgical History Past Surgical History:  Procedure Laterality Date   FACIAL FRACTURE SURGERY     metal plate under right eye   Family History History reviewed. No pertinent family history.  Social History Social History   Tobacco Use   Smoking status: Every Day    Current packs/day: 1.00    Types: Cigarettes    Passive exposure: Current   Smokeless tobacco: Never  Vaping Use   Vaping status: Never Used  Substance Use Topics   Alcohol use: Yes    Comment: 1 liter bottle of vodka prior to arrival   Drug use: Yes    Types: Methamphetamines   Allergies Other and Tylenol [acetaminophen]  Review of Systems Review of Systems  All other systems reviewed and are negative.   Physical Exam Vital Signs   I have reviewed the triage vital signs BP 118/80   Pulse 97   Temp 97.7 F (36.5 C) (Oral)   Resp 18   SpO2 98%  Physical Exam Vitals and nursing note reviewed.  Constitutional:      General: He is not in acute distress.    Appearance: Normal appearance.  HENT:     Head: Normocephalic and atraumatic.     Ears:     Comments: Superficial abrasion to the left ear    Mouth/Throat:     Mouth: Mucous membranes are moist.  Eyes:     Conjunctiva/sclera: Conjunctivae normal.  Cardiovascular:     Rate and Rhythm: Normal rate and regular rhythm.  Pulmonary:     Effort: Pulmonary effort is normal. No respiratory distress.     Breath sounds: Normal breath sounds.  Abdominal:     General: Abdomen is flat.     Palpations: Abdomen is soft.     Tenderness: There is no abdominal tenderness.  Musculoskeletal:     Right lower leg: No edema.     Left lower leg: No edema.     Comments: No midline C/T/L spine tenderness, no focal extremity tenderness, FROM throughout BLE and BUE  Skin:    General: Skin is warm and dry.     Capillary Refill: Capillary refill takes less than 2 seconds.  Neurological:     Mental Status: He is alert and oriented to person, place, and time. Mental status is at baseline.     Comments: Intoxicated ,moves all four extremities   Psychiatric:        Mood and Affect: Mood normal.        Behavior: Behavior normal.     ED Results and Treatments Labs (all labs ordered are listed, but only abnormal results are displayed) Labs Reviewed  COMPREHENSIVE METABOLIC PANEL - Abnormal; Notable for the following components:      Result Value   CO2 20 (*)    AST 47 (*)    All other components within normal limits  ETHANOL - Abnormal; Notable for the following components:   Alcohol, Ethyl (B) 292 (*)    All other components within normal limits  SALICYLATE LEVEL - Abnormal; Notable for the following components:   Salicylate Lvl <7.0 (*)    All other components within  normal limits  ACETAMINOPHEN LEVEL - Abnormal; Notable for the following components:   Acetaminophen (Tylenol), Serum <10 (*)    All other components within normal limits  RAPID URINE DRUG SCREEN, HOSP PERFORMED - Abnormal; Notable for the following components:   Amphetamines POSITIVE (*)    Tetrahydrocannabinol POSITIVE (*)    All other components  within normal limits  CBC  LEVETIRACETAM LEVEL                                                                                                                          Radiology CT Head Wo Contrast  Result Date: 03/26/2023 CLINICAL DATA:  Head trauma EXAM: CT HEAD WITHOUT CONTRAST CT CERVICAL SPINE WITHOUT CONTRAST TECHNIQUE: Multidetector CT imaging of the head and cervical spine was performed following the standard protocol without intravenous contrast. Multiplanar CT image reconstructions of the cervical spine were also generated. RADIATION DOSE REDUCTION: This exam was performed according to the departmental dose-optimization program which includes automated exposure control, adjustment of the mA and/or kV according to patient size and/or use of iterative reconstruction technique. COMPARISON:  12/10/2022 FINDINGS: CT HEAD FINDINGS Brain: There is no mass, hemorrhage or extra-axial collection. There is generalized atrophy without lobar predilection. There is hypoattenuation of the periventricular white matter, most commonly indicating chronic ischemic microangiopathy. Vascular: No abnormal hyperdensity of the major intracranial arteries or dural venous sinuses. No intracranial atherosclerosis. Skull: The visualized skull base, calvarium and extracranial soft tissues are normal. Sinuses/Orbits: No fluid levels or advanced mucosal thickening of the visualized paranasal sinuses. No mastoid or middle ear effusion. The orbits are normal. CT CERVICAL SPINE FINDINGS Alignment: No static subluxation. Facets are aligned. Occipital condyles are normally  positioned. Skull base and vertebrae: No acute fracture. Soft tissues and spinal canal: No prevertebral fluid or swelling. No visible canal hematoma. Disc levels: No advanced spinal canal or neural foraminal stenosis. Upper chest: No pneumothorax, pulmonary nodule or pleural effusion. Other: Normal visualized paraspinal cervical soft tissues. IMPRESSION: 1. No acute intracranial abnormality. 2. Chronic ischemic microangiopathy and generalized atrophy. 3. No acute fracture or static subluxation of the cervical spine. Electronically Signed   By: Deatra Robinson M.D.   On: 03/26/2023 20:08   CT Cervical Spine Wo Contrast  Result Date: 03/26/2023 CLINICAL DATA:  Head trauma EXAM: CT HEAD WITHOUT CONTRAST CT CERVICAL SPINE WITHOUT CONTRAST TECHNIQUE: Multidetector CT imaging of the head and cervical spine was performed following the standard protocol without intravenous contrast. Multiplanar CT image reconstructions of the cervical spine were also generated. RADIATION DOSE REDUCTION: This exam was performed according to the departmental dose-optimization program which includes automated exposure control, adjustment of the mA and/or kV according to patient size and/or use of iterative reconstruction technique. COMPARISON:  12/10/2022 FINDINGS: CT HEAD FINDINGS Brain: There is no mass, hemorrhage or extra-axial collection. There is generalized atrophy without lobar predilection. There is hypoattenuation of the periventricular white matter, most commonly indicating chronic ischemic microangiopathy. Vascular: No abnormal hyperdensity of the major intracranial arteries or dural venous sinuses. No intracranial atherosclerosis. Skull: The visualized skull base, calvarium and extracranial soft tissues are normal. Sinuses/Orbits: No fluid levels or advanced mucosal thickening of the visualized paranasal sinuses. No mastoid or middle ear effusion. The orbits are normal. CT CERVICAL SPINE FINDINGS Alignment: No static  subluxation. Facets are aligned. Occipital condyles are  normally positioned. Skull base and vertebrae: No acute fracture. Soft tissues and spinal canal: No prevertebral fluid or swelling. No visible canal hematoma. Disc levels: No advanced spinal canal or neural foraminal stenosis. Upper chest: No pneumothorax, pulmonary nodule or pleural effusion. Other: Normal visualized paraspinal cervical soft tissues. IMPRESSION: 1. No acute intracranial abnormality. 2. Chronic ischemic microangiopathy and generalized atrophy. 3. No acute fracture or static subluxation of the cervical spine. Electronically Signed   By: Deatra Robinson M.D.   On: 03/26/2023 20:08   DG Chest Portable 1 View  Result Date: 03/26/2023 CLINICAL DATA:  Shortness of breath, fall. Recent diagnosis of stage II lung cancer. EXAM: PORTABLE CHEST 1 VIEW COMPARISON:  10/07/2022, 11/22/2021. FINDINGS: The heart size and mediastinal contours are within normal limits. No consolidation, effusion, or pneumothorax. Previously described bibasilar nodules are not visualized on this exam. No acute osseous abnormality. IMPRESSION: No active disease. Electronically Signed   By: Thornell Sartorius M.D.   On: 03/26/2023 20:02    Pertinent labs & imaging results that were available during my care of the patient were reviewed by me and considered in my medical decision making (see MDM for details).  Medications Ordered in ED Medications  LORazepam (ATIVAN) injection 0-4 mg (has no administration in time range)    Or  LORazepam (ATIVAN) tablet 0-4 mg (has no administration in time range)  LORazepam (ATIVAN) injection 0-4 mg (has no administration in time range)    Or  LORazepam (ATIVAN) tablet 0-4 mg (has no administration in time range)  thiamine (VITAMIN B1) tablet 100 mg (has no administration in time range)    Or  thiamine (VITAMIN B1) injection 100 mg (has no administration in time range)                                                                                                                                      Procedures Procedures  (including critical care time)  Medical Decision Making / ED Course   MDM:   42 year old presenting with fall.  He also reports feeling suicidal.  He also reports ingestion of Keppra as an intentional overdose.  Patient obviously intoxicated.  Will obtain CT head and CT cervical spine given intoxicated .  Otherwise no clear signs of trauma.  He also reports suicidal ideation.  Differential includes suicidal ideation, depression, malingering.  He reported keppra ingestion initially but then denied it. Overall low concern for actual ingestion. Will check tylenol and acetaminophen level. He denies any other ingestion.  Will obtain medical screening labs.  If cleared by poison control, imaging and labs are reassuring anticipate psychiatric consult.  Clinical Course as of 03/26/23 2236  Thu Mar 26, 2023  2235 Workup without acute process.  CT head CT neck without acute injury.  Labs reassuring.  Ethanol is elevated.  Will place on CIWA.  Patient endorsed suicidal thoughts so he will be evaluated by psychiatry.  He will have sitter present.   [WS]    Clinical Course User Index [WS] Lonell Grandchild, MD     Additional history obtained: -Additional history obtained from ems    Lab Tests: -I ordered, reviewed, and interpreted labs.   The pertinent results include:   Labs Reviewed  COMPREHENSIVE METABOLIC PANEL - Abnormal; Notable for the following components:      Result Value   CO2 20 (*)    AST 47 (*)    All other components within normal limits  ETHANOL - Abnormal; Notable for the following components:   Alcohol, Ethyl (B) 292 (*)    All other components within normal limits  SALICYLATE LEVEL - Abnormal; Notable for the following components:   Salicylate Lvl <7.0 (*)    All other components within normal limits  ACETAMINOPHEN LEVEL - Abnormal; Notable for the following components:    Acetaminophen (Tylenol), Serum <10 (*)    All other components within normal limits  RAPID URINE DRUG SCREEN, HOSP PERFORMED - Abnormal; Notable for the following components:   Amphetamines POSITIVE (*)    Tetrahydrocannabinol POSITIVE (*)    All other components within normal limits  CBC  LEVETIRACETAM LEVEL    Notable for + UDS, elevated alcohol level  EKG   EKG Interpretation Date/Time:  Thursday March 26 2023 19:45:55 EDT Ventricular Rate:  90 PR Interval:  176 QRS Duration:  103 QT Interval:  348 QTC Calculation: 426 R Axis:   21  Text Interpretation: Sinus rhythm Confirmed by Alvino Blood ([id number]) on 03/26/2023 8:00:11 PM         Imaging Studies ordered: I ordered imaging studies including negative head CT  On my interpretation imaging demonstrates no acute process I independently visualized and interpreted imaging. I agree with the radiologist interpretation   Medicines ordered and prescription drug management: Meds ordered this encounter  Medications   OR Linked Order Group    LORazepam (ATIVAN) injection 0-4 mg     Order Specific Question:   CIWA-AR < 5 =     Answer:   0 mg     Order Specific Question:   CIWA-AR 5 -10 =     Answer:   1 mg     Order Specific Question:   CIWA-AR 11 -15 =     Answer:   2 mg     Order Specific Question:   CIWA-AR 16 -20 =     Answer:   3 mg     Order Specific Question:   CIWA-AR 16 -20 =     Answer:   Recheck CIWA-AR in 1 hour; if > 20 notify MD     Order Specific Question:   CIWA-AR > 20 =     Answer:   4 mg     Order Specific Question:   CIWA-AR > 20 =     Answer:   Notify MD    LORazepam (ATIVAN) tablet 0-4 mg     Order Specific Question:   CIWA-AR < 5 =     Answer:   0 mg     Order Specific Question:   CIWA-AR 5 -10 =     Answer:   1 mg     Order Specific Question:   CIWA-AR 11 -15 =     Answer:   2 mg     Order Specific Question:   CIWA-AR 16 -20 =     Answer:   3 mg     Order  Specific Question:   CIWA-AR  16 -20 =     Answer:   Recheck CIWA-AR in 1 hour; if > 20 notify MD     Order Specific Question:   CIWA-AR > 20 =     Answer:   4 mg     Order Specific Question:   CIWA-AR > 20 =     Answer:   Notify MD   OR Linked Order Group    LORazepam (ATIVAN) injection 0-4 mg     Order Specific Question:   CIWA-AR < 5 =     Answer:   0 mg     Order Specific Question:   CIWA-AR 5 -10 =     Answer:   1 mg     Order Specific Question:   CIWA-AR 11 -15 =     Answer:   2 mg     Order Specific Question:   CIWA-AR 16 -20 =     Answer:   3 mg     Order Specific Question:   CIWA-AR 16 -20 =     Answer:   Recheck CIWA-AR in 1 hour; if > 20 notify MD     Order Specific Question:   CIWA-AR > 20 =     Answer:   4 mg     Order Specific Question:   CIWA-AR > 20 =     Answer:   Notify MD    LORazepam (ATIVAN) tablet 0-4 mg     Order Specific Question:   CIWA-AR < 5 =     Answer:   0 mg     Order Specific Question:   CIWA-AR 5 -10 =     Answer:   1 mg     Order Specific Question:   CIWA-AR 11 -15 =     Answer:   2 mg     Order Specific Question:   CIWA-AR 16 -20 =     Answer:   3 mg     Order Specific Question:   CIWA-AR 16 -20 =     Answer:   Recheck CIWA-AR in 1 hour; if > 20 notify MD     Order Specific Question:   CIWA-AR > 20 =     Answer:   4 mg     Order Specific Question:   CIWA-AR > 20 =     Answer:   Notify MD   OR Linked Order Group    thiamine (VITAMIN B1) tablet 100 mg    thiamine (VITAMIN B1) injection 100 mg    -I have reviewed the patients home medicines and have made adjustments as needed   Consultations Obtained: I requested consultation with the TTS,  and discussed lab and imaging findings as well as pertinent plan - they recommend: pending   Reevaluation: After the interventions noted above, I reevaluated the patient and found that their symptoms have improved  Co morbidities that complicate the patient evaluation  Past Medical History:  Diagnosis Date   Anxiety     Hepatitis C    PTSD (post-traumatic stress disorder)       Dispostion: Disposition decision including need for hospitalization was considered, and patient pending for psychiatric evaluation    Final Clinical Impression(s) / ED Diagnoses Final diagnoses:  Suicidal ideation     This chart was dictated using voice recognition software.  Despite best efforts to proofread,  errors can occur which can change the documentation meaning.    Alvino Blood  L, MD 03/26/23 2237

## 2023-03-26 NOTE — ED Notes (Signed)
Pt changed out into psych scrubs. Belongings have been placed in 19-22rm nurses station

## 2023-03-26 NOTE — ED Triage Notes (Signed)
BIBA from the street for SI. Found on the side walk after a fall, C-Collar in place. Pt smells of ETOH, and is stating that he wants to kill himself. Pt was recently dx with Stage 2 lung CA.

## 2023-03-27 ENCOUNTER — Encounter: Payer: Self-pay | Admitting: Psychiatry

## 2023-03-27 ENCOUNTER — Other Ambulatory Visit: Payer: Self-pay

## 2023-03-27 ENCOUNTER — Inpatient Hospital Stay
Admission: AD | Admit: 2023-03-27 | Discharge: 2023-04-06 | DRG: 885 | Disposition: A | Discharge status: Home / Self Care | Payer: BLUE CROSS/BLUE SHIELD | Source: Intra-hospital | Attending: Psychiatry | Admitting: Psychiatry

## 2023-03-27 DIAGNOSIS — F17210 Nicotine dependence, cigarettes, uncomplicated: Secondary | ICD-10-CM | POA: Diagnosis present

## 2023-03-27 DIAGNOSIS — F1514 Other stimulant abuse with stimulant-induced mood disorder: Secondary | ICD-10-CM | POA: Diagnosis present

## 2023-03-27 DIAGNOSIS — G40909 Epilepsy, unspecified, not intractable, without status epilepticus: Secondary | ICD-10-CM | POA: Diagnosis present

## 2023-03-27 DIAGNOSIS — Z79899 Other long term (current) drug therapy: Secondary | ICD-10-CM | POA: Diagnosis not present

## 2023-03-27 DIAGNOSIS — Z886 Allergy status to analgesic agent status: Secondary | ICD-10-CM

## 2023-03-27 DIAGNOSIS — F1510 Other stimulant abuse, uncomplicated: Secondary | ICD-10-CM | POA: Diagnosis present

## 2023-03-27 DIAGNOSIS — Z5941 Food insecurity: Secondary | ICD-10-CM

## 2023-03-27 DIAGNOSIS — F4310 Post-traumatic stress disorder, unspecified: Secondary | ICD-10-CM | POA: Diagnosis present

## 2023-03-27 DIAGNOSIS — R45851 Suicidal ideations: Secondary | ICD-10-CM | POA: Diagnosis present

## 2023-03-27 DIAGNOSIS — G4700 Insomnia, unspecified: Secondary | ICD-10-CM | POA: Diagnosis present

## 2023-03-27 DIAGNOSIS — F411 Generalized anxiety disorder: Secondary | ICD-10-CM | POA: Diagnosis present

## 2023-03-27 DIAGNOSIS — Z5982 Transportation insecurity: Secondary | ICD-10-CM | POA: Diagnosis not present

## 2023-03-27 DIAGNOSIS — Z91148 Patient's other noncompliance with medication regimen for other reason: Secondary | ICD-10-CM | POA: Diagnosis not present

## 2023-03-27 DIAGNOSIS — F332 Major depressive disorder, recurrent severe without psychotic features: Secondary | ICD-10-CM | POA: Diagnosis present

## 2023-03-27 DIAGNOSIS — F1010 Alcohol abuse, uncomplicated: Secondary | ICD-10-CM | POA: Diagnosis present

## 2023-03-27 DIAGNOSIS — S00412A Abrasion of left ear, initial encounter: Secondary | ICD-10-CM | POA: Diagnosis not present

## 2023-03-27 MED ORDER — THIAMINE HCL 100 MG/ML IJ SOLN
100.0000 mg | Freq: Every day | INTRAMUSCULAR | Status: DC
  Filled 2023-03-27 (×3): qty 2

## 2023-03-27 MED ORDER — HALOPERIDOL LACTATE 5 MG/ML IJ SOLN: 5.0000 mg | Freq: Three times a day (TID) | INTRAMUSCULAR | Status: DC | PRN

## 2023-03-27 MED ORDER — ONDANSETRON 4 MG PO TBDP: 4.0000 mg | ORAL_TABLET | Freq: Three times a day (TID) | ORAL | Status: DC | PRN

## 2023-03-27 MED ORDER — DIPHENHYDRAMINE HCL 50 MG/ML IJ SOLN: 50.0000 mg | Freq: Three times a day (TID) | INTRAMUSCULAR | Status: DC | PRN

## 2023-03-27 MED ORDER — NICOTINE POLACRILEX 2 MG MT GUM
4.0000 mg | CHEWING_GUM | Freq: Four times a day (QID) | OROMUCOSAL | Status: DC | PRN
  Administered 2023-03-29 – 2023-04-05 (×6): 4 mg via ORAL
  Filled 2023-03-27 (×6): qty 2

## 2023-03-27 MED ORDER — MAGNESIUM HYDROXIDE 400 MG/5ML PO SUSP
30.0000 mL | Freq: Every day | ORAL | Status: DC | PRN
  Administered 2023-04-03: 30 mL via ORAL
  Filled 2023-03-27: qty 30

## 2023-03-27 MED ORDER — LEVETIRACETAM 750 MG PO TABS
750.0000 mg | ORAL_TABLET | Freq: Two times a day (BID) | ORAL | Status: DC
  Administered 2023-03-27 – 2023-04-06 (×20): 750 mg via ORAL
  Filled 2023-03-27 (×21): qty 1

## 2023-03-27 MED ORDER — LORAZEPAM 2 MG PO TABS: 2.0000 mg | ORAL_TABLET | Freq: Three times a day (TID) | ORAL | Status: DC | PRN

## 2023-03-27 MED ORDER — HYDROXYZINE HCL 50 MG PO TABS
50.0000 mg | ORAL_TABLET | Freq: Four times a day (QID) | ORAL | Status: DC | PRN
  Administered 2023-04-01: 50 mg via ORAL
  Filled 2023-03-27: qty 1

## 2023-03-27 MED ORDER — DIPHENHYDRAMINE HCL 25 MG PO CAPS: 50.0000 mg | ORAL_CAPSULE | Freq: Three times a day (TID) | ORAL | Status: DC | PRN

## 2023-03-27 MED ORDER — LORAZEPAM 1 MG PO TABS: 1.0000 mg | ORAL_TABLET | ORAL | Status: DC | PRN

## 2023-03-27 MED ORDER — LORAZEPAM 2 MG PO TABS
0.0000 mg | ORAL_TABLET | Freq: Two times a day (BID) | ORAL | Status: AC
Start: 2023-03-29 — End: 2023-03-31
  Administered 2023-03-29: 2 mg via ORAL
  Administered 2023-03-30: 4 mg via ORAL
  Administered 2023-03-30: 1 mg via ORAL
  Filled 2023-03-27: qty 1
  Filled 2023-03-27: qty 2
  Filled 2023-03-27: qty 1

## 2023-03-27 MED ORDER — FLUOXETINE HCL 20 MG PO CAPS
40.0000 mg | ORAL_CAPSULE | Freq: Every day | ORAL | Status: DC
  Administered 2023-03-27: 40 mg via ORAL
  Filled 2023-03-27: qty 2

## 2023-03-27 MED ORDER — RISPERIDONE 1 MG PO TABS
2.0000 mg | ORAL_TABLET | Freq: Two times a day (BID) | ORAL | Status: DC
  Administered 2023-03-27 – 2023-04-06 (×20): 2 mg via ORAL
  Filled 2023-03-27 (×20): qty 2

## 2023-03-27 MED ORDER — THIAMINE MONONITRATE 100 MG PO TABS
100.0000 mg | ORAL_TABLET | Freq: Every day | ORAL | Status: DC
Start: 2023-03-28 — End: 2023-04-06
  Administered 2023-03-28 – 2023-04-06 (×10): 100 mg via ORAL
  Filled 2023-03-27 (×10): qty 1

## 2023-03-27 MED ORDER — FLUOXETINE HCL 20 MG PO CAPS
40.0000 mg | ORAL_CAPSULE | Freq: Every day | ORAL | Status: DC
  Administered 2023-03-28 – 2023-04-06 (×10): 40 mg via ORAL
  Filled 2023-03-27 (×11): qty 2

## 2023-03-27 MED ORDER — OLANZAPINE 10 MG PO TBDP
10.0000 mg | ORAL_TABLET | Freq: Three times a day (TID) | ORAL | Status: DC | PRN
Start: 2023-03-27 — End: 2023-03-27

## 2023-03-27 MED ORDER — RISPERIDONE 2 MG PO TABS
2.0000 mg | ORAL_TABLET | Freq: Two times a day (BID) | ORAL | Status: DC
  Administered 2023-03-27: 2 mg via ORAL
  Filled 2023-03-27: qty 1

## 2023-03-27 MED ORDER — HALOPERIDOL 5 MG PO TABS: 5.0000 mg | ORAL_TABLET | Freq: Three times a day (TID) | ORAL | Status: DC | PRN

## 2023-03-27 MED ORDER — ALBUTEROL SULFATE HFA 108 (90 BASE) MCG/ACT IN AERS: 2.0000 | INHALATION_SPRAY | RESPIRATORY_TRACT | Status: DC | PRN

## 2023-03-27 MED ORDER — ZIPRASIDONE MESYLATE 20 MG IM SOLR: 20.0000 mg | INTRAMUSCULAR | Status: DC | PRN

## 2023-03-27 MED ORDER — HYDROXYZINE HCL 25 MG PO TABS: 50.0000 mg | ORAL_TABLET | Freq: Four times a day (QID) | ORAL | Status: DC | PRN

## 2023-03-27 MED ORDER — ALBUTEROL SULFATE HFA 108 (90 BASE) MCG/ACT IN AERS
2.0000 | INHALATION_SPRAY | RESPIRATORY_TRACT | Status: DC | PRN
  Administered 2023-04-01 – 2023-04-06 (×9): 2 via RESPIRATORY_TRACT
  Filled 2023-03-27: qty 6.7

## 2023-03-27 MED ORDER — TRAZODONE HCL 100 MG PO TABS
100.0000 mg | ORAL_TABLET | Freq: Every day | ORAL | Status: DC
  Administered 2023-03-28 – 2023-04-05 (×9): 100 mg via ORAL
  Filled 2023-03-27 (×9): qty 1

## 2023-03-27 MED ORDER — ALUM & MAG HYDROXIDE-SIMETH 200-200-20 MG/5ML PO SUSP: 30.0000 mL | ORAL | Status: DC | PRN

## 2023-03-27 MED ORDER — LEVETIRACETAM 500 MG PO TABS
750.0000 mg | ORAL_TABLET | Freq: Two times a day (BID) | ORAL | Status: DC
  Administered 2023-03-27: 750 mg via ORAL
  Filled 2023-03-27: qty 1

## 2023-03-27 MED ORDER — TRAZODONE HCL 100 MG PO TABS: 100.0000 mg | ORAL_TABLET | Freq: Every day | ORAL | Status: DC

## 2023-03-27 MED ORDER — LORAZEPAM 2 MG PO TABS
0.0000 mg | ORAL_TABLET | Freq: Four times a day (QID) | ORAL | Status: AC
Start: 2023-03-27 — End: 2023-03-28

## 2023-03-27 MED ORDER — LORAZEPAM 2 MG/ML IJ SOLN
0.0000 mg | Freq: Two times a day (BID) | INTRAMUSCULAR | Status: AC
Start: 2023-03-29 — End: 2023-03-31

## 2023-03-27 MED ORDER — LORAZEPAM 2 MG/ML IJ SOLN
0.0000 mg | Freq: Four times a day (QID) | INTRAMUSCULAR | Status: AC
Start: 2023-03-27 — End: 2023-03-28

## 2023-03-27 MED ORDER — LORAZEPAM 2 MG/ML IJ SOLN: 2.0000 mg | Freq: Three times a day (TID) | INTRAMUSCULAR | Status: DC | PRN

## 2023-03-27 MED ORDER — NICOTINE POLACRILEX 2 MG MT GUM: 4.0000 mg | CHEWING_GUM | Freq: Four times a day (QID) | OROMUCOSAL | Status: DC | PRN

## 2023-03-27 NOTE — Plan of Care (Signed)
New admission.  Problem: Education: Goal: Knowledge of [facility] [facility] Education information/materials will improve Outcome: Not Progressing Goal: Emotional status will improve Outcome: Not Progressing Goal: Mental status will improve Outcome: Not Progressing Goal: Verbalization of understanding the information provided will improve Outcome: Not Progressing   Problem: Activity: Goal: Interest or engagement in activities will improve Outcome: Not Progressing Goal: Sleeping patterns will improve Outcome: Not Progressing   Problem: Coping: Goal: Ability to verbalize frustrations and anger appropriately will improve Outcome: Not Progressing Goal: Ability to demonstrate self-control will improve Outcome: Not Progressing   Problem: Health Behavior/Discharge Planning: Goal: Identification of resources available to assist in meeting health care needs will improve Outcome: Not Progressing Goal: Compliance with treatment plan for underlying cause of condition will improve Outcome: Not Progressing   Problem: Physical Regulation: Goal: Ability to maintain clinical measurements within normal limits will improve Outcome: Not Progressing   Problem: Safety: Goal: Periods of time without injury will increase Outcome: Not Progressing   Problem: Education: Goal: Knowledge of disease or condition will improve Outcome: Not Progressing Goal: Understanding of discharge needs will improve Outcome: Not Progressing   Problem: Physical Regulation: Goal: Complications related to the disease process, condition or treatment will be avoided or minimized Outcome: Not Progressing   Problem: Education: Goal: Ability to make informed decisions regarding treatment will improve Outcome: Not Progressing   Problem: Coping: Goal: Coping ability will improve Outcome: Not Progressing   Problem: Self-Concept: Goal: Ability to identify factors that promote anxiety will improve Outcome: Not  Progressing Goal: Level of anxiety will decrease Outcome: Not Progressing Goal: Ability to modify response to factors that promote anxiety will improve Outcome: Not Progressing

## 2023-03-27 NOTE — Progress Notes (Signed)
Pt was accepted to [facility]  03/27/2023 Bed Assignment 310   Address: [address] [place], [place], [place] [address]  BMU FAX Number [phone]  Pt meets inpatient criteria per Ophelia Shoulder NP  Attending Physician will be Dr. Shellee Milo  Report can be called to [phone]  Pt can arrive after 2 pm today   Care Team notified: Pricilla Loveless MD, April Wilson paramedic, [facility] RN, and Junior Rimando RN  [place] Cadie Sorci LCSW-A  03/27/2023 12:16 PM

## 2023-03-27 NOTE — Progress Notes (Signed)
[facility] Consult Note  Patient Name: Raymond [name] MRN: [id number] DOB: 1981/04/26 DATE OF Consult: 03/27/2023  PRIMARY PSYCHIATRIC DIAGNOSES  1.  Major depressive disorder  2.  Methamphetamine induced psychosis   RECOMMENDATIONS  Recommendations: Medication recommendations:   Continue fluoxetine 40 mg and risperidone 2 mg, continue trazodone 100 mg for sleep Non-Medication/therapeutic recommendations:  recommend inpatient admission for safety and stabilization.  Consider discharging to rehab. Is inpatient psychiatric hospitalization recommended for this patient? Yes (Explain why):   Patient is actively suicidal without a specific plan but with intent. Follow-Up Telepsychiatry C/L services: We will sign off for now. Please re-consult our service if needed for any concerning changes in the patient's condition, discharge planning, or questions. Communication: Treatment team members (and family members if applicable) who were involved in treatment/care discussions and planning, and with whom we spoke or engaged with via secure text/chat, include the following: Epic Secure chat   Thank you for involving [place] in the care of this patient. If you have any additional questions or concerns, please call [phone] and ask for me or the provider on-call.  TELEPSYCHIATRY ATTESTATION & CONSENT  As the provider for this telehealth consult, I attest that I verified the patient's identity using two separate identifiers, introduced myself to the patient, provided my credentials, disclosed my location, and performed this encounter via a HIPAA-compliant, real-time, face-to-face, two-way, interactive audio and video platform and with the full consent and agreement of the patient (or guardian as applicable.)  Patient physical location: [facility] Emergency Department at [facility] . Telehealth provider physical location: home office in state of [place].  Video start time: 1 am  (Central  Time) Video end time: 1:30 am (Central Time)  IDENTIFYING DATA  Raymond [name] is a 42 y.o. year-old male for whom a psychiatric consultation has been ordered by the primary provider. The patient was identified using two separate identifiers.  CHIEF COMPLAINT/REASON FOR CONSULT  Fall, suicidal ideation   HISTORY OF PRESENT ILLNESS (HPI)  The patient   He is a 42 year old male who presents to the emergency department brought in by ambulance after a fall on the sidewalk.  He was intoxicated by alcohol.  On presentation, he disclosed suicidal ideation.  When I evaluated him.  He reports being miserable, having nobody, being diagnosed with lung cancer and being alone.  He says he has not been able to get help and nobody takes it seriously.  He feels nobody understands his pain.  He is actively suicidal, says he does not have a plan but he will do it.  He endorses visual and auditory hallucinations when he uses methamphetamine.  He also endorses compliance with fluoxetine and risperidone. He is agreeable to inpatient psychiatric hospitalization.Marland Kitchen  PAST PSYCHIATRIC HISTORY   Otherwise as per HPI above.  PAST MEDICAL HISTORY  Past Medical History:  Diagnosis Date   Anxiety    Hepatitis C    PTSD (post-traumatic stress disorder)      HOME MEDICATIONS  Facility Ordered Medications  Medication   LORazepam (ATIVAN) injection 0-4 mg   Or   LORazepam (ATIVAN) tablet 0-4 mg   [START ON 03/29/2023] LORazepam (ATIVAN) injection 0-4 mg   Or   [START ON 03/29/2023] LORazepam (ATIVAN) tablet 0-4 mg   thiamine (VITAMIN B1) tablet 100 mg   Or   thiamine (VITAMIN B1) injection 100 mg   PTA Medications  Medication Sig   lisinopril (ZESTRIL) 10 MG tablet Take 1 tablet (10 mg total) by mouth daily. (  Patient not taking: Reported on 02/04/2023)   hydrOXYzine (ATARAX) 50 MG tablet Take 1 tablet (50 mg total) by mouth every 6 (six) hours as needed for anxiety.   albuterol (VENTOLIN HFA) 108 (90 Base) MCG/ACT  inhaler Inhale 2 puffs into the lungs every 4 (four) hours as needed for wheezing or shortness of breath.   prazosin (MINIPRESS) 1 MG capsule Take 1 capsule (1 mg total) by mouth at bedtime. (Patient not taking: Reported on 02/04/2023)   FLUoxetine (PROZAC) 40 MG capsule Take 1 capsule (40 mg total) by mouth daily. (Patient not taking: Reported on 02/04/2023)   risperiDONE (RISPERDAL) 2 MG tablet Take 1 tablet (2 mg total) by mouth 2 (two) times daily.   traZODone (DESYREL) 100 MG tablet Take 1 tablet (100 mg total) by mouth at bedtime.   nicotine polacrilex (NICORETTE) 4 MG gum Take 1 each (4 mg total) by mouth 4 (four) times daily as needed for smoking cessation.     ALLERGIES  Allergies  Allergen Reactions   Other Itching, Rash and Other (See Comments)    Peanuts   Tylenol [Acetaminophen] Rash    SOCIAL & SUBSTANCE USE HISTORY  Social History   Socioeconomic History   Marital status: Single    Spouse name: Not on file   Number of children: Not on file   Years of education: Not on file   Highest education level: Not on file  Occupational History   Not on file  Tobacco Use   Smoking status: Every Day    Current packs/day: 1.00    Types: Cigarettes    Passive exposure: Current   Smokeless tobacco: Never  Vaping Use   Vaping status: Never Used  Substance and Sexual Activity   Alcohol use: Yes    Comment: 1 liter bottle of vodka prior to arrival   Drug use: Yes    Types: Methamphetamines   Sexual activity: Not Currently  Other Topics Concern   Not on file  Social History Narrative   Not on file   Social Determinants of Health   Financial Resource Strain: Not on File (08/18/2022)   Received from [employer], [employer]    Financial Resource Strain: 0  Food Insecurity: Not on File (02/26/2023)   Received from [employer]    Food: 0  Recent Concern: Food Insecurity - Food Insecurity Present (01/23/2023)   Hunger Vital Sign    Worried About  Running Out of Food in the Last Year: Often true    Ran Out of Food in the Last Year: Often true  Transportation Needs: Unmet Transportation Needs (01/23/2023)   PRAPARE - Administrator, Civil Service (Medical): Yes    Lack of Transportation (Non-Medical): Yes  Physical Activity: Not on File (08/18/2022)   Received from [place], [place]   Physical Activity    Physical Activity: 0  Stress: Not on File (08/18/2022)   Received from [facility], [place]   Stress    Stress: 0  Social Connections: Not on File (02/15/2023)   Received from [employer]   Social Connections    Connectedness: 0   Social History   Tobacco Use  Smoking Status Every Day   Current packs/day: 1.00   Types: Cigarettes   Passive exposure: Current  Smokeless Tobacco Never   Social History   Substance and Sexual Activity  Alcohol Use Yes   Comment: 1 liter bottle of vodka prior to arrival   Social History  Substance and Sexual Activity  Drug Use Yes   Types: Methamphetamines      FAMILY HISTORY  History reviewed. No pertinent family history. Family Psychiatric History (if known):  denies  MENTAL STATUS EXAM (MSE)  Presentation  General Appearance:  Disheveled  Eye Contact: Fair  Speech: Garbled  Speech Volume: Normal  Handedness: Right   Mood and Affect  Mood: Depressed  Affect: Congruent   Thought Process  Thought Processes: Coherent  Descriptions of Associations: Intact  Orientation: Full (Time, Place and Person)  Thought Content: Logical  History of Schizophrenia/Schizoaffective disorder: No  Duration of Psychotic Symptoms:No data recorded Hallucinations: Hallucinations: Auditory; Visual Description of Auditory Hallucinations: when using meth Description of Visual Hallucinations: when on meth  Ideas of Reference: None  Suicidal Thoughts: Suicidal Thoughts: Yes, Active SI Active Intent and/or Plan: Without Plan  Homicidal Thoughts: Homicidal Thoughts:  No   Sensorium  Memory: Immediate Fair; Recent Fair  Judgment: Fair  Insight: Fair   Chartered certified accountant: Fair  Attention Span: Fair  Recall: Fair  Fund of Knowledge: Fair  Language: Fair   Psychomotor Activity  Psychomotor Activity: Psychomotor Activity: Normal   Assets  Assets: Desire for Improvement   Sleep  Sleep: Sleep: Fair   VITALS  Blood pressure 118/80, pulse 97, temperature 97.7 F (36.5 C), temperature source Oral, resp. rate 18, SpO2 98%.  LABS  Admission on 03/26/2023  Component Date Value Ref Range Status   Sodium 03/26/2023 142  135 - 145 mmol/L Final   Potassium 03/26/2023 3.6  3.5 - 5.1 mmol/L Final   Chloride 03/26/2023 110  98 - 111 mmol/L Final   CO2 03/26/2023 20 (L)  22 - 32 mmol/L Final   Glucose, Bld 03/26/2023 88  70 - 99 mg/dL Final   Glucose reference range applies only to samples taken after fasting for at least 8 hours.   BUN 03/26/2023 6  6 - 20 mg/dL Final   Creatinine, Ser 03/26/2023 0.71  0.61 - 1.24 mg/dL Final   Calcium 11/23/7626 8.9  8.9 - 10.3 mg/dL Final   Total Protein 31/51/7616 7.6  6.5 - 8.1 g/dL Final   Albumin 07/37/1062 4.5  3.5 - 5.0 g/dL Final   AST 69/48/5462 47 (H)  15 - 41 U/L Final   ALT 03/26/2023 39  0 - 44 U/L Final   Alkaline Phosphatase 03/26/2023 73  38 - 126 U/L Final   Total Bilirubin 03/26/2023 0.5  0.3 - 1.2 mg/dL Final   GFR, Estimated 03/26/2023 >60  >60 mL/min Final   Comment: (NOTE) Calculated using the CKD-EPI Creatinine Equation (2021)    Anion gap 03/26/2023 12  5 - 15 Final   Performed at [facility], [address], [place], [place] [address]   Alcohol, Ethyl (B) 03/26/2023 292 (H)  <10 mg/dL Final   Comment: (NOTE) Lowest detectable limit for serum alcohol is 10 mg/dL.  For medical purposes only. Performed at [facility], [address], [place], [place] [address]    Salicylate Lvl 03/26/2023 <7.0 (L)  7.0 - 30.0  mg/dL Final   Performed at [facility], [address], [place], [place] [address]   Acetaminophen (Tylenol), Serum 03/26/2023 <10 (L)  10 - 30 ug/mL Final   Comment: (NOTE) Therapeutic concentrations vary significantly. A range of 10-30 ug/mL  may be an effective concentration for many patients. However, some  are best treated at concentrations outside of this range. Acetaminophen concentrations >150 ug/mL at  4 hours after ingestion  and >50 ug/mL at 12 hours after ingestion are often associated with  toxic reactions.  Performed at [facility], [address] [place], [place] [address]    WBC 03/26/2023 5.6  4.0 - 10.5 K/uL Final   RBC 03/26/2023 4.88  4.22 - 5.81 MIL/uL Final   Hemoglobin 03/26/2023 15.3  13.0 - 17.0 g/dL Final   HCT 42/59/5638 45.1  39.0 - 52.0 % Final   MCV 03/26/2023 92.4  80.0 - 100.0 fL Final   MCH 03/26/2023 31.4  26.0 - 34.0 pg Final   MCHC 03/26/2023 33.9  30.0 - 36.0 g/dL Final   RDW 75/64/3329 13.7  11.5 - 15.5 % Final   Platelets 03/26/2023 265  150 - 400 K/uL Final   nRBC 03/26/2023 0.0  0.0 - 0.2 % Final   Performed at [facility], [address] [place], [place] [address]   Opiates 03/26/2023 NONE DETECTED  NONE DETECTED Final   Cocaine 03/26/2023 NONE DETECTED  NONE DETECTED Final   Benzodiazepines 03/26/2023 NONE DETECTED  NONE DETECTED Final   Amphetamines 03/26/2023 POSITIVE (A)  NONE DETECTED Final   Tetrahydrocannabinol 03/26/2023 POSITIVE (A)  NONE DETECTED Final   Barbiturates 03/26/2023 NONE DETECTED  NONE DETECTED Final   Comment: (NOTE) DRUG SCREEN FOR MEDICAL PURPOSES ONLY.  IF CONFIRMATION IS NEEDED FOR ANY PURPOSE, NOTIFY LAB WITHIN 5 DAYS.  LOWEST DETECTABLE LIMITS FOR URINE DRUG SCREEN Drug Class                     Cutoff (ng/mL) Amphetamine and metabolites    1000 Barbiturate and metabolites    200 Benzodiazepine                 200 Opiates and metabolites         300 Cocaine and metabolites        300 THC                            50 Performed at [facility], [address], [place], [place] [address]     PSYCHIATRIC REVIEW OF SYSTEMS (ROS)  ROS: Notable for the following relevant positive findings: ROS  Additional findings:      Musculoskeletal: No abnormal movements observed      Gait & Station: Laying/Sitting      Pain Screening: Denies        RISK FORMULATION/ASSESSMENT  Is the patient experiencing any suicidal or homicidal ideations: Yes       Explain if yes: denies specific pain but says he " will do it"  Protective factors considered for safety management: help seeking   Risk factors/concerns considered for safety management:  Depression Substance abuse/dependence Barriers to accessing treatment Male gender Unmarried  Is there a safety management plan with the patient and treatment team to minimize risk factors and promote protective factors: Yes           Explain: Inpatient admission  Is crisis care placement or psychiatric hospitalization recommended: Yes     Based on my current evaluation and risk assessment, patient is determined at this time to be at:  Moderate Risk  *RISK ASSESSMENT Risk assessment is a dynamic process; it is possible that this patient's condition, and risk level, may change. This should be re-evaluated and managed over time as appropriate. Please re-consult psychiatric consult services if additional assistance is needed in terms  of risk assessment and management. If your team decides to discharge this patient, please advise the patient how to best access emergency psychiatric services, or to call 911, if their condition worsens or they feel unsafe in any way.   Dian Situ, MD Telepsychiatry Consult ServicesPatient ID: Raymond [name], male   DOB: Dec 10, 1980, 42 y.o.   MRN: [id number]

## 2023-03-27 NOTE — ED Notes (Signed)
Patient calm and cooperative this morning.

## 2023-03-27 NOTE — Progress Notes (Signed)
Admission Note:   Report was received from April, Paramedic on a 42 year old male who presents Voluntary in no acute distress for the treatment of SI and Depression. Patient appears a little drowsy and depressed. Patient was calm and cooperative with admission process. Patient endorsed both depression and anxiety, rating them a "4/10" and "5/10", stating that he was just recently diagnosed with lung cancer and that he ran out of his seizure medication. Patient denied SI/HI/AVH and pain at this time. Patient has a past medical history of Hepatitis. Patient's goal for this hospitalization is to "try to be better and avoid any cancers. I just want to fell a lot better". Skin was assessed with Gigi, RN and found to have some scratch marks to his right forearm, right eyelid, and left ear, stating that he got drunk and fell. Patient searched and no contraband found and unit policies explained and understanding verbalized. Consents obtained. Food and fluids offered, and both accepted. Patient had no additional questions or concerns to voice to this Clinical research associate. Patient remains safe on the unit at this time.

## 2023-03-27 NOTE — Group Note (Signed)
Date:  03/27/2023 Time:  9:38 PM  Group Topic/Focus:  Wrap-Up Group:   The focus of this group is to help patients review their daily goal of treatment and discuss progress on daily workbooks.    Participation Level:  Did Not Attend  Participation Quality:   none  Affect:   none  Cognitive:   none  Insight: None  Engagement in Group:   none  Modes of Intervention:   none  Additional Comments:  none   Raymond [name] 03/27/2023, 9:38 PM

## 2023-03-27 NOTE — Progress Notes (Signed)
Patient had no complaints of any withdrawal signs or symptoms. Patient stated that he only drinks a couple of times a month.   03/27/23 1756  CIWA-Ar  Nausea and Vomiting 0  Tactile Disturbances 0  Tremor 0  Auditory Disturbances 0  Paroxysmal Sweats 0  Visual Disturbances 0  Anxiety 0  Headache, Fullness in Head 0  Agitation 0  Orientation and Clouding of Sensorium 0  CIWA-Ar Total 0

## 2023-03-27 NOTE — ED Notes (Addendum)
Report called to [facility] RN at [facility] at [phone].  Pt can come after 1400.  Called for transport Dr Marlou Porch is accepting pt

## 2023-03-27 NOTE — Tx Team (Signed)
Initial Treatment Plan 03/27/2023 3:52 PM Raymond [name] [id number]    PATIENT STRESSORS: Health problems   Medication change or noncompliance   Substance abuse     PATIENT STRENGTHS: Communication skills  General fund of knowledge  Motivation for treatment/growth    PATIENT IDENTIFIED PROBLEMS: SI prior to admission  Medication noncompliance  Anxiety  Alcohol abuse  Depression             DISCHARGE CRITERIA:  Ability to meet basic life and health needs Improved stabilization in mood, thinking, and/or behavior Need for constant or close observation no longer present Reduction of life-threatening or endangering symptoms to within safe limits  PRELIMINARY DISCHARGE PLAN: Outpatient therapy Return to previous living arrangement  PATIENT/FAMILY INVOLVEMENT: This treatment plan has been presented to and reviewed with the patient, Raymond [name]. The patient has been given the opportunity to ask questions and make suggestions.  Tamika Nou, RN 03/27/2023, 3:52 PM

## 2023-03-27 NOTE — ED Provider Notes (Signed)
Emergency Medicine Observation Re-evaluation Note  Raymond [name] is a 42 y.o. male, seen on rounds today.  Pt initially presented to the ED for complaints of Suicidal Currently, the patient is resting.  Physical Exam  BP 117/78 (BP Location: Left Arm)   Pulse 83   Temp 98.4 F (36.9 C) (Oral)   Resp 18   SpO2 98%  Physical Exam General: No acute distress Lungs: Normal effort Psych: Suicidal  ED Course / MDM  EKG:EKG Interpretation Date/Time:  Thursday March 26 2023 19:45:55 EDT Ventricular Rate:  90 PR Interval:  176 QRS Duration:  103 QT Interval:  348 QTC Calculation: 426 R Axis:   21  Text Interpretation: Sinus rhythm Confirmed by Alvino Blood ([id number]) on 03/26/2023 8:00:11 PM  I have reviewed the labs performed to date as well as medications administered while in observation.  Recent changes in the last 24 hours include psychiatric meds.  Will also order as needed Zofran as he is complaining of nausea and restart his Keppra.  Plan  Current plan is for inpatient psychiatric admission for suicidal ideation.    Pricilla Loveless, MD 03/27/23 [phone]

## 2023-03-27 NOTE — ED Notes (Signed)
Patient off unit to [facility] BMU per provider. Patient alert, calm, cooperative, no s/s of distress. Patient discharge information and belongings given to [employer]. Patient ambulatory off unit, escorted by NT. Patient transported by [employer].

## 2023-03-27 NOTE — Group Note (Signed)
Date:  03/27/2023 Time:  5:40 PM  Group Topic/Focus:  Activity Group:  The focus of the group is to promote activity and encourage the patients to go outside to the courtyard and get some fresh air and some exercise.    Participation Level:  Active  Participation Quality:  Appropriate  Affect:  Appropriate  Cognitive:  Appropriate  Insight: Appropriate  Engagement in Group:  Engaged  Modes of Intervention:  Activity  Additional Comments:    Raymond [name] [name] 03/27/2023, 5:40 PM

## 2023-03-28 DIAGNOSIS — F332 Major depressive disorder, recurrent severe without psychotic features: Secondary | ICD-10-CM | POA: Diagnosis not present

## 2023-03-28 DIAGNOSIS — F1510 Other stimulant abuse, uncomplicated: Secondary | ICD-10-CM | POA: Diagnosis present

## 2023-03-28 LAB — TSH: TSH: 1.534 u[IU]/mL (ref 0.350–4.500)

## 2023-03-28 LAB — HEMOGLOBIN A1C
Hgb A1c MFr Bld: 5.4 % (ref 4.8–5.6)
Mean Plasma Glucose: 108.28 mg/dL

## 2023-03-28 LAB — LIPID PANEL
Cholesterol: 198 mg/dL (ref 0–200)
HDL: 69 mg/dL (ref >40)
LDL Cholesterol: 84 mg/dL (ref 0–99)
Total CHOL/HDL Ratio: 2.9 {ratio}
Triglycerides: 225 mg/dL — ABNORMAL HIGH (ref <150)
VLDL: 45 mg/dL — ABNORMAL HIGH (ref 0–40)

## 2023-03-28 LAB — LEVETIRACETAM LEVEL: Levetiracetam Lvl: <2 ug/mL — ABNORMAL LOW (ref 10.0–40.0)

## 2023-03-28 NOTE — Progress Notes (Signed)
   03/27/23 2300  Psych Admission Type (Psych Patients Only)  Admission Status Voluntary  Psychosocial Assessment  Patient Complaints Anxiety  Eye Contact Fair  Facial Expression Grimacing  Affect Preoccupied  Speech Soft  Interaction Avoidant  Motor Activity Slow  Appearance/Hygiene In scrubs  Behavior Characteristics Cooperative;Appropriate to situation  Mood Irritable  Aggressive Behavior  Effect No apparent injury  Thought Process  Coherency Circumstantial  Content Preoccupation  Delusions None reported or observed  Perception WDL  Hallucination None reported or observed  Judgment WDL  Confusion None  Danger to Self  Current suicidal ideation? Denies  Agreement Not to Harm Self Yes  Danger to Others  Danger to Others Reported or observed  Danger to Others Abnormal  Harmful Behavior to others No threats or harm toward other people  Destructive Behavior No threats or harm toward property   Remained quiet throughout the night. No complaint voiced upon wakening.

## 2023-03-28 NOTE — [facility] Suicide Risk Assessment (Signed)
[facility] Admission Suicide Risk Assessment   Nursing information obtained from:  Patient Demographic factors:  Male, Living alone, Unemployed, Low socioeconomic status Current Mental Status:  NA Loss Factors:  Decline in physical health, Financial problems / change in socioeconomic status Historical Factors:  Impulsivity Risk Reduction Factors:  Positive social support  Total Time spent with patient: 45 minutes Principal Problem: Major depressive disorder, recurrent, severe without psychotic behavior (HCC) Diagnosis:  Principal Problem:   Major depressive disorder, recurrent, severe without psychotic behavior (HCC) Active Problems:   Alcohol abuse   Methamphetamine abuse (HCC)  Subjective Data:  "I went to [facility] because I had a seizure.  I was also suicidal."  Continued Clinical Symptoms:  Alcohol Use Disorder Identification Test Final Score (AUDIT): 2 The "Alcohol Use Disorders Identification Test", Guidelines for Use in Primary Care, Second Edition.  World Science writer [facility]). Score between 0-7:  no or low risk or alcohol related problems. Score between 8-15:  moderate risk of alcohol related problems. Score between 16-19:  high risk of alcohol related problems. Score 20 or above:  warrants further diagnostic evaluation for alcohol dependence and treatment.   CLINICAL FACTORS:   Depression:   Impulsivity Alcohol/Substance Abuse/Dependencies   Musculoskeletal: Strength & Muscle Tone: within normal limits Gait & Station: normal Patient leans: N/A  Psychiatric Specialty Exam: Physical Exam Vitals and nursing note reviewed.  Constitutional:      Appearance: Normal appearance.  HENT:     Head: Normocephalic.     Nose: Nose normal.  Pulmonary:     Effort: Pulmonary effort is normal.  Musculoskeletal:        General: Normal range of motion.     Cervical back: Normal range of motion.  Neurological:     General: No focal deficit present.     Mental Status: He is alert  and oriented to person, place, and time.     Review of Systems  Psychiatric/Behavioral:  Positive for depression and substance abuse. The patient is nervous/anxious.   All other systems reviewed and are negative.   Blood pressure (!) 141/107, pulse 99, temperature 98.3 F (36.8 C), resp. rate 18, height 6\' 1"  (1.854 m), weight 74.4 kg, SpO2 96%.Body mass index is 21.64 kg/m.  General Appearance: Casual  Eye Contact:  Fair  Speech:  Normal Rate  Volume:  Normal  Mood:  Anxious and Depressed  Affect:  Congruent  Thought Process:  Coherent  Orientation:  Full (Time, Place, and Person)  Thought Content:  WDL and Logical  Suicidal Thoughts:  No  Homicidal Thoughts:  No  Memory:  Immediate;   Fair Recent;   Fair Remote;   Fair  Judgement:  Poor  Insight:  Fair  Psychomotor Activity:  Normal  Concentration:  Concentration: Fair and Attention Span: Fair  Recall:  [employer] of Knowledge:  Fair  Language:  Good  Akathisia:  No  Handed:  Right  AIMS (if indicated):     Assets:  Housing Leisure Time Physical Health Resilience Social Support  ADL's:  Intact  Cognition:  WNL  Sleep:         Physical Exam: Physical Exam Vitals and nursing note reviewed.  Constitutional:      Appearance: Normal appearance.  HENT:     Head: Normocephalic.     Nose: Nose normal.  Pulmonary:     Effort: Pulmonary effort is normal.  Musculoskeletal:        General: Normal range of motion.     Cervical  back: Normal range of motion.  Neurological:     General: No focal deficit present.     Mental Status: He is alert and oriented to person, place, and time.    Review of Systems  Psychiatric/Behavioral:  Positive for depression and substance abuse. The patient is nervous/anxious.   All other systems reviewed and are negative.  Blood pressure (!) 141/107, pulse 99, temperature 98.3 F (36.8 C), resp. rate 18, height 6\' 1"  (1.854 m), weight 74.4 kg, SpO2 96%. Body mass index is 21.64  kg/m.   COGNITIVE FEATURES THAT CONTRIBUTE TO RISK:  None    SUICIDE RISK:   Minimal: No identifiable suicidal ideation.  Patients presenting with no risk factors but with morbid ruminations; may be classified as minimal risk based on the severity of the depressive symptoms  PLAN OF CARE:  Major depressive disorder, recurrent, severe without psychosis: Prozac 40 mg daily Risperdal 2 mg BID  Anxiety: Hydroxyzine 50 mg every six hours PRN  Alcohol abuse: Ativan detox protocol  Seizures: Keppra 750 mg BID  Insomnia: Trazodone 100 mg daily at bedtime  I certify that inpatient services furnished can reasonably be expected to improve the patient's condition.   Nanine Means, NP 03/28/2023, 9:22 AM

## 2023-03-28 NOTE — Group Note (Deleted)
Date:  03/28/2023 Time:  2:51 PM  Group Topic/Focus:  Self Care:   The focus of this group is to help patients understand the importance of self-care in order to improve or restore emotional, physical, spiritual, interpersonal, and financial health.     Participation Level:  {[facility] PARTICIPATION ZOXWR:60454}  Participation Quality:  {[facility] PARTICIPATION QUALITY:22265}  Affect:  {[facility] AFFECT:22266}  Cognitive:  {[facility] COGNITIVE:22267}  Insight: {[facility] Insight2:20797}  Engagement in Group:  {[facility] ENGAGEMENT IN UJWJX:91478}  Modes of Intervention:  {[facility] MODES OF INTERVENTION:22269}  Additional Comments:  ***  Raymond [name] 03/28/2023, 2:51 PM

## 2023-03-28 NOTE — Progress Notes (Signed)
D- Patient alert and oriented x 4. Affect bright/mood pleasant. Denies SI/ HI/ AVH. Patient denies pain. Patient endorses depression and anxiety. His goal today is "to hang out with people and be calm". A- Scheduled medications administered to patient, per MD orders. Support and encouragement provided.  Routine safety checks conducted every 15 minutes without incident.  Patient informed to notify staff with problems or concerns and verbalizes understanding. R- No adverse drug reactions noted.  Patient compliant with medications and treatment plan. Patient receptive, calm cooperative and interacts well with others on the unit.  Patient contracts for safety and  remains safe on the unit at this time.

## 2023-03-28 NOTE — Plan of Care (Signed)
  Problem: Activity: Goal: Sleeping patterns will improve Outcome: Progressing   Problem: Education: Goal: Mental status will improve Outcome: Progressing

## 2023-03-28 NOTE — Group Note (Signed)
Date:  03/28/2023 Time:  1:47 PM  Group Topic/Focus:  Activity Group:  The focus of the group is to promote activity for the patients to encourage exercise to go out in the courtyard and get some exercise.     Participation Level:  Active  Participation Quality:  Appropriate  Affect:  Appropriate  Cognitive:  Appropriate  Insight: Appropriate  Engagement in Group:  Engaged  Modes of Intervention:  Activity  Additional Comments:    Raymond [name] 03/28/2023, 1:47 PM

## 2023-03-28 NOTE — H&P (Signed)
Psychiatric Admission Assessment Adult  Patient Identification: Raymond [name] MRN:  [id number] Date of Evaluation:  03/28/2023 Chief Complaint:  MDD (major depressive disorder), recurrent episode, severe (HCC) [F33.2] Principal Diagnosis: Major depressive disorder, recurrent, severe without psychotic behavior (HCC) Diagnosis:  Principal Problem:   Major depressive disorder, recurrent, severe without psychotic behavior (HCC) Active Problems:   Alcohol abuse   Methamphetamine abuse (HCC)  History of Present Illness:  43 yo male admitted with suicidal ideations and alcohol/meth abuse, history of depression, anxiety, PTSD, and stimulant use d/o. Client reports being at [facility] prior to coming [facility] ED "I think I had a seizure", reports running out of his Keppra and not taking it for three days. No suicidal ideations on assessment. Rates his depression and anxiety 6/10, reports no panic attacks recently. Client reports being hospitalized 4-5 months ago for his depression and anxiety. Client initially denies substance use, despite testing positive for Meth., when telling the client his test result he states "Oh yeah that's from weed. I smoke weed sometimes and it must have been laced with Meth.". Reports smoking a pack of cigarettes per day and drinks twice a week, two 40 oz at a time. Sleep is reported as "on and off" and has occasional nightmares "about things I used to do and did not do in the past.". Client is diagnosed with PTSD and reports occasional flashbacks.  Appetite is reported as good and denies any weight changes. Denies homicidal ideations and hallucinations. "Sometimes I feel like my Mother is talking to me", clients mother passed away 3 years ago.   Client lives alone on [address]. Due to his reported seizures he can not operate a vehicle so he takes the bus and can not have  a job. [facility]. Reports being interested in going to rehab or  next step at discharge. "It really doesn't matter where I go. I just need help, that's all."  Associated Signs/Symptoms: Depression Symptoms:  depressed mood, anxiety, (Hypo) Manic Symptoms:   none Anxiety Symptoms:  Excessive Worry, Psychotic Symptoms:   none PTSD Symptoms: Had a traumatic exposure:  nightmares Total Time spent with patient: 1 hour  Past Psychiatric History: depression and anxiety, stimulant use d/o, PTSD  Is the patient at risk to self? No.  Has the patient been a risk to self in the past 6 months? Yes.    Has the patient been a risk to self within the distant past? Yes.    Is the patient a risk to others? No.  Has the patient been a risk to others in the past 6 months? No.  Has the patient been a risk to others within the distant past? No.   [place] Scale:  Flowsheet Row Admission (Current) from 03/27/2023 in [facility] IN[facility]IENT [facility]IC[facility] ED from 03/26/2023 in [facility] Emergency Department at [facility] ED from 02/03/2023 in [facility] Emergency Department at [facility]  C-SSRS RISK CATEGORY Error: Q7 should not be populated when Q6 is No High Risk Moderate Risk        Prior Inpatient Therapy: Yes.   Multiple admissions  Prior Outpatient Therapy: Yes.   [facility], [facility] on [facility] care   Alcohol Screening: 1. How often do you have a drink containing alcohol?: 2 to 4 times a month 2. How many drinks containing alcohol do you have on a typical day when you are drinking?: 1 or 2 3. How often do you have six or more  drinks on one occasion?: Never AUDIT-C Score: 2 4. How often during the last year have you found that you were not able to stop drinking once you had started?: Never 5. How often during the last year have you failed to do what was normally expected from you because of drinking?: Never 6. How often during the last year have you needed a first drink in the morning to get yourself going after a heavy drinking session?:  Never 7. How often during the last year have you had a feeling of guilt of remorse after drinking?: Never 8. How often during the last year have you been unable to remember what happened the night before because you had been drinking?: Never 9. Have you or someone else been injured as a result of your drinking?: No 10. Has a relative or friend or a doctor or another health worker been concerned about your drinking or suggested you cut down?: No Alcohol Use Disorder Identification Test Final Score (AUDIT): 2 Substance Abuse History in the last 12 months:  Yes.   Consequences of Substance Abuse: Relationship isues Previous Psychotropic Medications: Yes  Psychological Evaluations: Yes  Past Medical History:  Past Medical History:  Diagnosis Date   Anxiety    Hepatitis C    PTSD (post-traumatic stress disorder)     Past Surgical History:  Procedure Laterality Date   FACIAL FRACTURE SURGERY     metal plate under right eye   Family History: History reviewed. No pertinent family history. Family Psychiatric  History: none Tobacco Screening:  Social History   Tobacco Use  Smoking Status Every Day   Current packs/day: 1.00   Types: Cigarettes   Passive exposure: Current  Smokeless Tobacco Never    BH Tobacco Counseling     Are you interested in Tobacco Cessation Medications?  Yes, implement Nicotene Replacement Protocol Counseled patient on smoking cessation:  Refused/Declined practical counseling Reason Tobacco Screening Not Completed: No value filed.       Social History:  Social History   Substance and Sexual Activity  Alcohol Use Yes   Comment: 1 liter bottle of vodka prior to arrival     Social History   Substance and Sexual Activity  Drug Use Yes   Types: Methamphetamines    Additional Social History:  he has an apartment to return to      Allergies:   Allergies  Allergen Reactions   Other Itching, Rash and Other (See Comments)    Peanuts   Tylenol  [Acetaminophen] Rash   Lab Results:  Results for orders placed or performed during the hospital encounter of 03/26/23 (from the past 48 hour(s))  Rapid urine drug screen (hospital performed)     Status: Abnormal   Collection Time: 03/26/23  7:14 PM  Result Value Ref Range   Opiates NONE DETECTED NONE DETECTED   Cocaine NONE DETECTED NONE DETECTED   Benzodiazepines NONE DETECTED NONE DETECTED   Amphetamines POSITIVE (A) NONE DETECTED   Tetrahydrocannabinol POSITIVE (A) NONE DETECTED   Barbiturates NONE DETECTED NONE DETECTED    Comment: (NOTE) DRUG SCREEN FOR MEDICAL PURPOSES ONLY.  IF CONFIRMATION IS NEEDED FOR ANY PURPOSE, NOTIFY LAB WITHIN 5 DAYS.  LOWEST DETECTABLE LIMITS FOR URINE DRUG SCREEN Drug Class                     Cutoff (ng/mL) Amphetamine and metabolites    1000 Barbiturate and metabolites    200 Benzodiazepine  200 Opiates and metabolites        300 Cocaine and metabolites        300 THC                            50 Performed at [facility], [address], [place], [place] [address]   Comprehensive metabolic panel     Status: Abnormal   Collection Time: 03/26/23  7:35 PM  Result Value Ref Range   Sodium 142 135 - 145 mmol/L   Potassium 3.6 3.5 - 5.1 mmol/L   Chloride 110 98 - 111 mmol/L   CO2 20 (L) 22 - 32 mmol/L   Glucose, Bld 88 70 - 99 mg/dL    Comment: Glucose reference range applies only to samples taken after fasting for at least 8 hours.   BUN 6 6 - 20 mg/dL   Creatinine, Ser 6.04 0.61 - 1.24 mg/dL   Calcium 8.9 8.9 - 54.0 mg/dL   Total Protein 7.6 6.5 - 8.1 g/dL   Albumin 4.5 3.5 - 5.0 g/dL   AST 47 (H) 15 - 41 U/L   ALT 39 0 - 44 U/L   Alkaline Phosphatase 73 38 - 126 U/L   Total Bilirubin 0.5 0.3 - 1.2 mg/dL   GFR, Estimated >98 >11 mL/min    Comment: (NOTE) Calculated using the CKD-EPI Creatinine Equation (2021)    Anion gap 12 5 - 15    Comment: Performed at [facility], [address], [place], [place] [address]  Ethanol     Status: Abnormal   Collection Time: 03/26/23  7:35 PM  Result Value Ref Range   Alcohol, Ethyl (B) 292 (H) <10 mg/dL    Comment: (NOTE) Lowest detectable limit for serum alcohol is 10 mg/dL.  For medical purposes only. Performed at [facility], [address], [place], [place] [address]   Salicylate level     Status: Abnormal   Collection Time: 03/26/23  7:35 PM  Result Value Ref Range   Salicylate Lvl <7.0 (L) 7.0 - 30.0 mg/dL    Comment: Performed at [facility], [address] [place], [place] [address]  Acetaminophen level     Status: Abnormal   Collection Time: 03/26/23  7:35 PM  Result Value Ref Range   Acetaminophen (Tylenol), Serum <10 (L) 10 - 30 ug/mL    Comment: (NOTE) Therapeutic concentrations vary significantly. A range of 10-30 ug/mL  may be an effective concentration for many patients. However, some  are best treated at concentrations outside of this range. Acetaminophen concentrations >150 ug/mL at 4 hours after ingestion  and >50 ug/mL at 12 hours after ingestion are often associated with  toxic reactions.  Performed at [facility], [address] [place], [place] [address]   cbc     Status: None   Collection Time: 03/26/23  7:35 PM  Result Value Ref Range   WBC 5.6 4.0 - 10.5 K/uL   RBC 4.88 4.22 - 5.81 MIL/uL   Hemoglobin 15.3 13.0 - 17.0 g/dL   HCT 96.2 95.2 - 84.1 %   MCV 92.4 80.0 - 100.0 fL   MCH 31.4 26.0 - 34.0 pg   MCHC 33.9 30.0 - 36.0 g/dL   RDW 32.4 40.1 - 02.7 %   Platelets 265 150 - 400 K/uL   nRBC 0.0 0.0 - 0.2 %    Comment: Performed at  [facility], [address], [place], [place] [address]  Levetiracetam level     Status: Abnormal   Collection Time: 03/26/23  7:35 PM  Result Value Ref Range   Levetiracetam Lvl <2.0 (L) 10.0 - 40.0 ug/mL    Comment: (NOTE) Performed At: [facility] [address] [place], [place] [id number] Jolene Schimke MD NW:[id number]     Blood Alcohol level:  Lab Results  Component Value Date   ETH 292 (H) 03/26/2023   ETH <10 02/03/2023    Metabolic Disorder Labs:  Lab Results  Component Value Date   HGBA1C 5.5 01/26/2023   MPG 111.15 01/26/2023   MPG 128 10/29/2022   Lab Results  Component Value Date   PROLACTIN 12.0 01/22/2023   PROLACTIN 19.5 11/20/2022   Lab Results  Component Value Date   CHOL 157 01/26/2023   TRIG 309 (H) 01/26/2023   HDL 39 (L) 01/26/2023   CHOLHDL 4.0 01/26/2023   VLDL 62 (H) 01/26/2023   LDLCALC 56 01/26/2023   LDLCALC 93 10/29/2022    Current Medications: Current Facility-Administered Medications  Medication Dose Route Frequency Provider Last Rate Last Admin   albuterol (VENTOLIN HFA) 108 (90 Base) MCG/ACT inhaler 2 puff  2 puff Inhalation Q4H PRN Sarina Ill, DO       alum & mag hydroxide-simeth (MAALOX/MYLANTA) 200-200-20 MG/5ML suspension 30 mL  30 mL Oral Q4H PRN Sarina Ill, DO       diphenhydrAMINE (BENADRYL) capsule 50 mg  50 mg Oral TID PRN Sarina Ill, DO       Or   diphenhydrAMINE (BENADRYL) injection 50 mg  50 mg Intramuscular TID PRN Sarina Ill, DO       FLUoxetine (PROZAC) capsule 40 mg  40 mg Oral Daily Sarina Ill, DO   40 mg at 03/28/23 5784   haloperidol (HALDOL) tablet 5 mg  5 mg Oral TID PRN Sarina Ill, DO       Or   haloperidol lactate (HALDOL) injection 5 mg  5 mg Intramuscular TID PRN Sarina Ill, DO       hydrOXYzine (ATARAX) tablet 50 mg  50 mg Oral Q6H PRN Sarina Ill, DO       levETIRAcetam (KEPPRA) tablet 750 mg  750 mg Oral Q12H Sarina Ill, DO   750 mg at 03/28/23 0851   LORazepam (ATIVAN) injection 0-4 mg  0-4 mg Intravenous Q6H Sarina Ill, DO       Or   LORazepam (ATIVAN) tablet 0-4 mg  0-4 mg Oral Q6H Sarina Ill, DO       [START ON  03/29/2023] LORazepam (ATIVAN) injection 0-4 mg  0-4 mg Intravenous Q12H Sarina Ill, DO       Or   Melene Muller ON 03/29/2023] LORazepam (ATIVAN) tablet 0-4 mg  0-4 mg Oral Q12H Herrick, Richard Edward, DO       LORazepam (ATIVAN) tablet 2 mg  2 mg Oral TID PRN Sarina Ill, DO       Or   LORazepam (ATIVAN) injection 2 mg  2 mg Intramuscular TID PRN Sarina Ill, DO       magnesium hydroxide (MILK OF MAGNESIA) suspension 30 mL  30 mL Oral Daily PRN Sarina Ill, DO       nicotine polacrilex (NICORETTE) gum 4 mg  4 mg Oral QID PRN Sarina Ill, DO       ondansetron (ZOFRAN-ODT) disintegrating tablet 4  mg  4 mg Oral Q8H PRN Sarina Ill, DO       risperiDONE (RISPERDAL) tablet 2 mg  2 mg Oral BID Sarina Ill, DO   2 mg at 03/28/23 2956   thiamine (VITAMIN B1) tablet 100 mg  100 mg Oral Daily Sarina Ill, DO   100 mg at 03/28/23 2130   Or   thiamine (VITAMIN B1) injection 100 mg  100 mg Intravenous Daily Sarina Ill, DO       traZODone (DESYREL) tablet 100 mg  100 mg Oral QHS Sarina Ill, DO       PTA Medications: Medications Prior to Admission  Medication Sig Dispense Refill Last Dose   albuterol (VENTOLIN HFA) 108 (90 Base) MCG/ACT inhaler Inhale 2 puffs into the lungs every 4 (four) hours as needed for wheezing or shortness of breath. 6.7 g 0    FLUoxetine (PROZAC) 40 MG capsule Take 1 capsule (40 mg total) by mouth daily. (Patient not taking: Reported on 02/04/2023) 30 capsule 0    hydrOXYzine (ATARAX) 50 MG tablet Take 1 tablet (50 mg total) by mouth every 6 (six) hours as needed for anxiety. 10 tablet 0    levETIRAcetam (KEPPRA) 750 MG tablet Take 1 tablet (750 mg total) by mouth every 12 (twelve) hours. 60 tablet 0    lisinopril (ZESTRIL) 10 MG tablet Take 1 tablet (10 mg total) by mouth daily. (Patient not taking: Reported on 02/04/2023) 10 tablet 0    nicotine polacrilex (NICORETTE)  4 MG gum Take 1 each (4 mg total) by mouth 4 (four) times daily as needed for smoking cessation. 100 tablet 0    prazosin (MINIPRESS) 1 MG capsule Take 1 capsule (1 mg total) by mouth at bedtime. (Patient not taking: Reported on 02/04/2023) 30 capsule 0    risperiDONE (RISPERDAL) 2 MG tablet Take 1 tablet (2 mg total) by mouth 2 (two) times daily. 60 tablet 0    traZODone (DESYREL) 100 MG tablet Take 1 tablet (100 mg total) by mouth at bedtime. 30 tablet 0    Musculoskeletal: Strength & Muscle Tone: within normal limits Gait & Station: normal Patient leans: N/A   Psychiatric Specialty Exam: Physical Exam Vitals and nursing note reviewed.  Constitutional:      Appearance: Normal appearance.  HENT:     Head: Normocephalic.     Nose: Nose normal.  Pulmonary:     Effort: Pulmonary effort is normal.  Musculoskeletal:        General: Normal range of motion.     Cervical back: Normal range of motion.  Neurological:     General: No focal deficit present.     Mental Status: He is alert and oriented to person, place, and time.       Review of Systems  Psychiatric/Behavioral:  Positive for depression and substance abuse. The patient is nervous/anxious.   All other systems reviewed and are negative.    Blood pressure (!) 141/107, pulse 99, temperature 98.3 F (36.8 C), resp. rate 18, height 6\' 1"  (1.854 m), weight 74.4 kg, SpO2 96%.Body mass index is 21.64 kg/m.  General Appearance: Casual  Eye Contact:  Fair  Speech:  Normal Rate  Volume:  Normal  Mood:  Anxious and Depressed  Affect:  Congruent  Thought Process:  Coherent  Orientation:  Full (Time, Place, and Person)  Thought Content:  WDL and Logical  Suicidal Thoughts:  No  Homicidal Thoughts:  No  Memory:  Immediate;   Fair  Recent;   Fair Remote;   Fair  Judgement:  Poor  Insight:  Fair  Psychomotor Activity:  Normal  Concentration:  Concentration: Fair and Attention Span: Fair  Recall:  [employer] of Knowledge:  Fair   Language:  Good  Akathisia:  No  Handed:  Right  AIMS (if indicated):     Assets:  Housing Leisure Time Physical Health Resilience Social Support  ADL's:  Intact  Cognition:  WNL  Sleep:   Fair   Physical Exam: Physical Exam Vitals and nursing note reviewed.  Constitutional:      Appearance: Normal appearance.  HENT:     Head: Normocephalic.     Nose: Nose normal.  Pulmonary:     Effort: Pulmonary effort is normal.  Musculoskeletal:        General: Normal range of motion.     Cervical back: Normal range of motion.  Neurological:     General: No focal deficit present.     Mental Status: He is alert and oriented to person, place, and time.    Review of Systems  Psychiatric/Behavioral:  Positive for depression and substance abuse. The patient is nervous/anxious.   All other systems reviewed and are negative.  Blood pressure (!) 141/107, pulse 99, temperature 98.3 F (36.8 C), resp. rate 18, height 6\' 1"  (1.854 m), weight 74.4 kg, SpO2 96%. Body mass index is 21.64 kg/m.  Treatment Plan Summary: Daily contact with patient to assess and evaluate symptoms and progress in treatment, Medication management, and Plan :  EKG reviewed, WDL. Lipid panel, TSH and A1C pending.  Major depressive disorder, recurrent, severe without psychosis: Prozac 40 mg daily Risperdal 2 mg BID   Anxiety: Hydroxyzine 50 mg every six hours PRN   Alcohol abuse: Ativan detox protocol   Seizures: Keppra 750 mg BID   Insomnia: Trazodone 100 mg daily at bedtime    Observation Level/Precautions:  15 minute checks  Laboratory:  completed in the ED, stable  Psychotherapy:  individual and group therapy  Medications:  See above  Consultations:  none  Discharge Concerns:  none  Estimated LOS:  3-5 days  Other:     Physician Treatment Plan for Primary Diagnosis: Major depressive disorder, recurrent, severe without psychotic behavior (HCC) Long Term Goal(s): Improvement in symptoms so as  ready for discharge  Short Term Goals: Ability to identify changes in lifestyle to reduce recurrence of condition will improve, Ability to verbalize feelings will improve, Ability to disclose and discuss suicidal ideas, Ability to demonstrate self-control will improve, Ability to identify and develop effective coping behaviors will improve, Ability to maintain clinical measurements within normal limits will improve, Compliance with prescribed medications will improve, and Ability to identify triggers associated with substance abuse/mental health issues will improve  Physician Treatment Plan for Secondary Diagnosis: Principal Problem:   Major depressive disorder, recurrent, severe without psychotic behavior (HCC) Active Problems:   Alcohol abuse   Methamphetamine abuse (HCC)  Long Term Goal(s): Improvement in symptoms so as ready for discharge  Short Term Goals: Ability to identify changes in lifestyle to reduce recurrence of condition will improve, Ability to verbalize feelings will improve, Ability to disclose and discuss suicidal ideas, Ability to demonstrate self-control will improve, Ability to identify and develop effective coping behaviors will improve, Ability to maintain clinical measurements within normal limits will improve, Compliance with prescribed medications will improve, and Ability to identify triggers associated with substance abuse/mental health issues will improve  I certify that inpatient services furnished can  reasonably be expected to improve the patient's condition.    Nanine Means, NP 10/26/20249:30 AM

## 2023-03-28 NOTE — Group Note (Signed)
Date:  03/28/2023 Time:  8:49 PM  Group Topic/Focus:  Music therapy    Participation Level:  Active  Participation Quality:  Appropriate and Attentive  Affect:  Appropriate  Cognitive:  Alert and Appropriate  Insight: Appropriate and Good  Engagement in Group:  Developing/Improving  Modes of Intervention:  Limit-setting  Additional Comments:     Ishika Chesterfield 03/28/2023, 8:49 PM

## 2023-03-29 DIAGNOSIS — F332 Major depressive disorder, recurrent severe without psychotic features: Secondary | ICD-10-CM | POA: Diagnosis not present

## 2023-03-29 MED ORDER — GABAPENTIN 100 MG PO CAPS
100.0000 mg | ORAL_CAPSULE | Freq: Three times a day (TID) | ORAL | Status: DC
  Administered 2023-03-29 – 2023-04-06 (×24): 100 mg via ORAL
  Filled 2023-03-29 (×24): qty 1

## 2023-03-29 NOTE — Group Note (Signed)
Date:  03/29/2023 Time:  10:29 AM  Group Topic/Focus:  Goals Group:   The focus of this group is to help patients establish daily goals to achieve during treatment and discuss how the patient can incorporate goal setting into their daily lives to aide in recovery. Healthy Communication:   The focus of this group is to discuss communication, barriers to communication, as well as healthy ways to communicate with others. Identifying Needs:   The focus of this group is to help patients identify their personal needs that have been historically problematic and identify healthy behaviors to address their needs.    Participation Level:  Active  Participation Quality:  Appropriate  Affect:  Appropriate  Cognitive:  Alert, Appropriate, and Oriented  Insight: Appropriate  Engagement in Group:  Developing/Improving  Modes of Intervention:  Activity, Discussion, and Education  Additional Comments:    Rosaura Carpenter 03/29/2023, 10:29 AM

## 2023-03-29 NOTE — Progress Notes (Addendum)
Pt is alert and oriented. Affect is flat / mood is congruent. He reported anxiety and depression, which he rated at a 7/10. Pt denies pain,  SI/HI and AVH at this time.   Scheduled medications administered to patient per MD orders. Support and encouragement provided . Routine safety checks conducted every 15 minutes without incident. Patient informed to notify staff with problems or concerns and verbalize understanding.   No adverse drug reactions noted . Pt complaint with medications and treatment plan . Pt receptive, calm, cooperative and interacts well.

## 2023-03-29 NOTE — Plan of Care (Signed)
  Problem: Education: Goal: Knowledge of [facility] Education information/materials will improve Outcome: Progressing Goal: Emotional status will improve Outcome: Progressing Goal: Mental status will improve Outcome: Progressing Goal: Verbalization of understanding the information provided will improve Outcome: Progressing   Problem: Activity: Goal: Interest or engagement in activities will improve Outcome: Progressing Goal: Sleeping patterns will improve Outcome: Progressing   Problem: Coping: Goal: Ability to verbalize frustrations and anger appropriately will improve Outcome: Progressing Goal: Ability to demonstrate self-control will improve Outcome: Progressing   Problem: Health Behavior/Discharge Planning: Goal: Identification of resources available to assist in meeting health care needs will improve Outcome: Progressing Goal: Compliance with treatment plan for underlying cause of condition will improve Outcome: Progressing   Problem: Physical Regulation: Goal: Ability to maintain clinical measurements within normal limits will improve Outcome: Progressing   Problem: Safety: Goal: Periods of time without injury will increase Outcome: Progressing   Problem: Education: Goal: Knowledge of disease or condition will improve Outcome: Progressing Goal: Understanding of discharge needs will improve Outcome: Progressing   Problem: Physical Regulation: Goal: Complications related to the disease process, condition or treatment will be avoided or minimized Outcome: Progressing   Problem: Education: Goal: Ability to make informed decisions regarding treatment will improve Outcome: Progressing   Problem: Coping: Goal: Coping ability will improve Outcome: Progressing   Problem: Self-Concept: Goal: Ability to identify factors that promote anxiety will improve Outcome: Progressing Goal: Level of anxiety will decrease Outcome: Progressing Goal: Ability to modify  response to factors that promote anxiety will improve Outcome: Progressing

## 2023-03-29 NOTE — Progress Notes (Signed)
[facility] MD Progress Note  03/29/2023 12:51 PM Raymond [name]  MRN:  [id number]  Subjective:   Labs, vital signs, and notes reviewed.  His sleep was not good, "sweating in my sleep" from withdrawal from meth, denies cravings.  Moderate depression with no suicidal ideations.  High anxiety, no panic attacks.  He is worried about losing his wallet and not having medications until last month, "I told my dad to send money.  I don't have any money.  They are giving me the run around about my disability".  [facility] is [facility] his [facility] until he gets his disability.  Appetite is fair, not tolerating meat except fish.  No side effects from his medications.  Principal Problem: Major depressive disorder, recurrent, severe without psychotic behavior (HCC) Diagnosis: Principal Problem:   Major depressive disorder, recurrent, severe without psychotic behavior (HCC) Active Problems:   Alcohol abuse   Methamphetamine abuse (HCC)  Total Time spent with patient: 30 minutes  Past Psychiatric History: PTSD, depression, and anxiety  Past Medical History:  Past Medical History:  Diagnosis Date   Anxiety    Hepatitis C    PTSD (post-traumatic stress disorder)     Past Surgical History:  Procedure Laterality Date   FACIAL FRACTURE SURGERY     metal plate under right eye   Family History: History reviewed. No pertinent family history. Family Psychiatric  History: none Social History:  Social History   Substance and Sexual Activity  Alcohol Use Yes   Comment: 1 liter bottle of vodka prior to arrival     Social History   Substance and Sexual Activity  Drug Use Yes   Types: Methamphetamines    Social History   Socioeconomic History   Marital status: Single    Spouse name: Not on file   Number of children: Not on file   Years of education: Not on file   Highest education level: Not on file  Occupational History   Not on file  Tobacco Use   Smoking status: Every Day    Current packs/day: 1.00     Types: Cigarettes    Passive exposure: Current   Smokeless tobacco: Never  Vaping Use   Vaping status: Never Used  Substance and Sexual Activity   Alcohol use: Yes    Comment: 1 liter bottle of vodka prior to arrival   Drug use: Yes    Types: Methamphetamines   Sexual activity: Not Currently  Other Topics Concern   Not on file  Social History Narrative   Not on file   Social Determinants of Health   Financial Resource Strain: Not on File (08/18/2022)   Received from [employer] [employer]    Financial Resource Strain: 0  Food Insecurity: Food Insecurity Present (03/27/2023)   Hunger Vital Sign    Worried About Running Out of Food in the Last Year: Sometimes true    Ran Out of Food in the Last Year: Sometimes true  Transportation Needs: Unmet Transportation Needs (03/27/2023)   PRAPARE - Administrator, Civil Service (Medical): Yes    Lack of Transportation (Non-Medical): Yes  Physical Activity: Not on File (08/18/2022)   Received from [place], [place]   Physical Activity    Physical Activity: 0  Stress: Not on File (08/18/2022)   Received from [facility], [place]   Stress    Stress: 0  Social Connections: Not on File (02/15/2023)   Received from [employer]  Connectedness: 0   Additional Social History: lives alone in his apartment        Sleep: Poor  Appetite:  Fair  Current Medications: Current Facility-Administered Medications  Medication Dose Route Frequency Provider Last Rate Last Admin   albuterol (VENTOLIN HFA) 108 (90 Base) MCG/ACT inhaler 2 puff  2 puff Inhalation Q4H PRN Sarina Ill, DO       alum & mag hydroxide-simeth (MAALOX/MYLANTA) 200-200-20 MG/5ML suspension 30 mL  30 mL Oral Q4H PRN Sarina Ill, DO       diphenhydrAMINE (BENADRYL) capsule 50 mg  50 mg Oral TID PRN Sarina Ill, DO       Or   diphenhydrAMINE (BENADRYL) injection 50 mg  50 mg Intramuscular TID PRN Sarina Ill, DO       FLUoxetine (PROZAC) capsule 40 mg  40 mg Oral Daily Sarina Ill, DO   40 mg at 03/29/23 2130   haloperidol (HALDOL) tablet 5 mg  5 mg Oral TID PRN Sarina Ill, DO       Or   haloperidol lactate (HALDOL) injection 5 mg  5 mg Intramuscular TID PRN Sarina Ill, DO       hydrOXYzine (ATARAX) tablet 50 mg  50 mg Oral Q6H PRN Sarina Ill, DO       levETIRAcetam (KEPPRA) tablet 750 mg  750 mg Oral Q12H Sarina Ill, DO   750 mg at 03/29/23 8657   LORazepam (ATIVAN) injection 0-4 mg  0-4 mg Intravenous Q12H Sarina Ill, DO       Or   LORazepam (ATIVAN) tablet 0-4 mg  0-4 mg Oral Q12H Sarina Ill, DO   2 mg at 03/29/23 8469   LORazepam (ATIVAN) tablet 2 mg  2 mg Oral TID PRN Sarina Ill, DO       Or   LORazepam (ATIVAN) injection 2 mg  2 mg Intramuscular TID PRN Sarina Ill, DO       magnesium hydroxide (MILK OF MAGNESIA) suspension 30 mL  30 mL Oral Daily PRN Sarina Ill, DO       nicotine polacrilex (NICORETTE) gum 4 mg  4 mg Oral QID PRN Sarina Ill, DO       ondansetron (ZOFRAN-ODT) disintegrating tablet 4 mg  4 mg Oral Q8H PRN Sarina Ill, DO       risperiDONE (RISPERDAL) tablet 2 mg  2 mg Oral BID Sarina Ill, DO   2 mg at 03/29/23 0845   thiamine (VITAMIN B1) tablet 100 mg  100 mg Oral Daily Sarina Ill, DO   100 mg at 03/29/23 6295   Or   thiamine (VITAMIN B1) injection 100 mg  100 mg Intravenous Daily Sarina Ill, DO       traZODone (DESYREL) tablet 100 mg  100 mg Oral QHS Sarina Ill, DO   100 mg at 03/28/23 2123    Lab Results:  Results for orders placed or performed during the hospital encounter of 03/27/23 (from the past 48 hour(s))  Lipid panel     Status: Abnormal   Collection Time: 03/28/23 10:23 AM  Result Value Ref Range   Cholesterol 198 0 - 200 mg/dL    Triglycerides 284 (H) <150 mg/dL   HDL 69 >13 mg/dL   Total CHOL/HDL Ratio 2.9 RATIO   VLDL 45 (H) 0 - 40 mg/dL   LDL Cholesterol 84 0 - 99 mg/dL    Comment:  Total Cholesterol/HDL:CHD Risk Coronary Heart Disease Risk Table                     Men   Women  1/2 Average Risk   3.4   3.3  Average Risk       5.0   4.4  2 X Average Risk   9.6   7.1  3 X Average Risk  23.4   11.0        Use the calculated Patient Ratio above and the CHD Risk Table to determine the patient's CHD Risk.        ATP III CLASSIFICATION (LDL):  <100     mg/dL   Optimal  664-403  mg/dL   Near or Above                    Optimal  130-159  mg/dL   Borderline  474-259  mg/dL   High  >563     mg/dL   Very High Performed at [facility], [address] [place], [place] [address]   Hemoglobin A1c     Status: None   Collection Time: 03/28/23 10:24 AM  Result Value Ref Range   Hgb A1c MFr Bld 5.4 4.8 - 5.6 %    Comment: (NOTE) Pre diabetes:          5.7%-6.4%  Diabetes:              >6.4%  Glycemic control for   <7.0% adults with diabetes    Mean Plasma Glucose 108.28 mg/dL    Comment: Performed at [facility], [address] [place], [place] [address]  TSH     Status: None   Collection Time: 03/28/23 10:24 AM  Result Value Ref Range   TSH 1.534 0.350 - 4.500 uIU/mL    Comment: Performed by a 3rd Generation assay with a functional sensitivity of <=0.01 uIU/mL. Performed at [facility], [address] [place], [place] [address]     Blood Alcohol level:  Lab Results  Component Value Date   ETH 292 (H) 03/26/2023   ETH <10 02/03/2023    Metabolic Disorder Labs: Lab Results  Component Value Date   HGBA1C 5.4 03/28/2023   MPG 108.28 03/28/2023   MPG 111.15 01/26/2023   Lab Results  Component Value Date   PROLACTIN 12.0 01/22/2023   PROLACTIN 19.5 11/20/2022   Lab Results  Component Value Date   CHOL 198 03/28/2023   TRIG 225 (H) 03/28/2023   HDL 69  03/28/2023   CHOLHDL 2.9 03/28/2023   VLDL 45 (H) 03/28/2023   LDLCALC 84 03/28/2023   LDLCALC 56 01/26/2023    Physical Findings: AIMS:  , ,  ,  ,    CIWA:  CIWA-Ar Total: 0 COWS:     Musculoskeletal: Strength & Muscle Tone: within normal limits Gait & Station: normal Patient leans: N/A  Psychiatric Specialty Exam: Physical Exam Vitals and nursing note reviewed.  Constitutional:      Appearance: Normal appearance.  HENT:     Head: Normocephalic.     Nose: Nose normal.  Pulmonary:     Effort: Pulmonary effort is normal.  Musculoskeletal:        General: Normal range of motion.     Cervical back: Normal range of motion.  Neurological:     General: No focal deficit present.     Mental Status: He is alert and oriented to person, place, and time.  Review of Systems  Psychiatric/Behavioral:  Positive for depression and substance abuse. The patient is nervous/anxious.   All other systems reviewed and are negative.   Blood pressure (!) 127/94, pulse 74, temperature 98 F (36.7 C), resp. rate 18, height 6\' 1"  (1.854 m), weight 74.4 kg, SpO2 97%.Body mass index is 21.64 kg/m.  General Appearance: Casual  Eye Contact:  Fair  Speech:  Clear and Coherent  Volume:  Normal  Mood:  Anxious and Depressed  Affect:  Congruent  Thought Process:  Coherent  Orientation:  Full (Time, Place, and Person)  Thought Content:  WDL and Logical  Suicidal Thoughts:  No  Homicidal Thoughts:  No  Memory:  Immediate;   Fair Recent;   Fair Remote;   Fair  Judgement:  Fair  Insight:  Fair  Psychomotor Activity:  Normal  Concentration:  Concentration: Good and Attention Span: Good  Recall:  Good  Fund of Knowledge:  Fair  Language:  Good  Akathisia:  No  Handed:  Right  AIMS (if indicated):     Assets:  Housing Leisure Time Physical Health Resilience Social Support  ADL's:  Intact  Cognition:  WNL  Sleep:         Physical Exam: Physical Exam Vitals and nursing note  reviewed.  Constitutional:      Appearance: Normal appearance.  HENT:     Head: Normocephalic.     Nose: Nose normal.  Pulmonary:     Effort: Pulmonary effort is normal.  Musculoskeletal:        General: Normal range of motion.     Cervical back: Normal range of motion.  Neurological:     General: No focal deficit present.     Mental Status: He is alert and oriented to person, place, and time.    Review of Systems  Psychiatric/Behavioral:  Positive for depression and substance abuse. The patient is nervous/anxious.   All other systems reviewed and are negative.  Blood pressure (!) 127/94, pulse 74, temperature 98 F (36.7 C), resp. rate 18, height 6\' 1"  (1.854 m), weight 74.4 kg, SpO2 97%. Body mass index is 21.64 kg/m.   Treatment Plan Summary: Daily contact with patient to assess and evaluate symptoms and progress in treatment, Medication management, and Plan : EKG no QT prolongation  Lipid panel WDL except triglycerides of 225 H and VLDL 45 H TSH 1.534 A1C pending.   Major depressive disorder, recurrent, severe without psychosis: Prozac 40 mg daily Risperdal 2 mg BID   Anxiety: Hydroxyzine 50 mg every six hours PRN Started gabapentin 100 mg TID started   Alcohol abuse: Ativan detox protocol   Seizures: Keppra 750 mg BID   Insomnia: Trazodone 100 mg daily at bedtime  Nanine Means, NP 03/29/2023, 12:51 PM

## 2023-03-29 NOTE — Group Note (Signed)
Date:  03/29/2023 Time:  5:09 PM  Group Topic/Focus:  STRUCTURED GROUP ACTIVITY  The focus of the group is to promote activity for the patients to encourage exercise to go out in the courtyard and get some exercise.     Participation Level:  Active  Participation Quality:  Appropriate  Affect:  Appropriate  Cognitive:  Alert and Appropriate  Insight: Appropriate  Engagement in Group:  Developing/Improving  Modes of Intervention:  Activity  Additional Comments:    [name] 03/29/2023, 5:09 PM

## 2023-03-29 NOTE — Group Note (Signed)
Date:  03/29/2023 Time:  9:20 PM  Group Topic/Focus:  Wrap-Up Group:   The focus of this group is to help patients review their daily goal of treatment and discuss progress on daily workbooks.    Participation Level:  Active  Participation Quality:  Appropriate and Attentive  Affect:  Appropriate  Cognitive:  Appropriate  Insight: Appropriate and Good  Engagement in Group:  Supportive  Modes of Intervention:  Support  Additional Comments:     Belva Crome 03/29/2023, 9:20 PM
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
## 2023-03-29 NOTE — Plan of Care (Signed)
  Problem: Education: Goal: Knowledge of [facility] Education information/materials will improve Outcome: Progressing   Problem: Education: Goal: Emotional status will improve Outcome: Progressing   Problem: Education: Goal: Mental status will improve Outcome: Progressing   

## 2023-03-29 NOTE — Group Note (Signed)
LCSW Group Therapy Note  Group Date: 03/29/2023 Start Time: 1410 End Time: 1455   Type of Therapy and Topic:  Group Therapy - Coping Skills  Participation Level:  Active   Description of Group The focus of this group was to determine what unhealthy coping techniques typically are used by group members and what healthy coping techniques would be helpful in coping with various problems. Patients were guided in becoming aware of the differences between healthy and unhealthy coping techniques. Patients were asked to identify 2-3 healthy coping skills they would like to learn to use more effectively.  Therapeutic Goals Patients learned that coping is what human beings do all day long to deal with various situations in their lives Patients defined and discussed healthy vs unhealthy coping techniques Patients identified their preferred coping techniques and identified whether these were healthy or unhealthy Patients determined 2-3 healthy coping skills they would like to become more familiar with and use more often. Patients provided support and ideas to each other   Summary of Patient Progress:  The patient attended group. Patient proved open to input from peers and feedback from [facility]. Patient demonstrated  insight into the subject matter, was respectful of peers, and participated throughout the entire session. The patient participated in the game of BINGO.     Marshell Levan, LCSWA 03/29/2023  3:09 PM

## 2023-03-29 NOTE — Progress Notes (Signed)
D- Patient alert and oriented x 4. Affect flat/mood congruent. Denies SI/ HI/ AVH. Patient denies pain. Patient endorses depression and anxiety. His goal today is to "focus"and "be better". He began Gabapentin today and no adverse reactions noted. A- Scheduled medications administered to patient, per MD orders. Support and encouragement provided.  Routine safety checks conducted every 15 minutes without incident.  Patient informed to notify staff with problems or concerns and verbalizes understanding. R- No adverse drug reactions noted to any of his medications.  Patient compliant with medications and treatment plan. Patient receptive, calm, cooperative and interacts well with others on the unit.  Patient contracts for safety and  remains safe on the unit at this time.

## 2023-03-30 MED ORDER — ENSURE ENLIVE PO LIQD
1.0000 | Freq: Two times a day (BID) | ORAL | Status: DC
  Administered 2023-03-31 – 2023-04-04 (×9): 237 mL via ORAL

## 2023-03-30 NOTE — Progress Notes (Signed)
[facility] MD Progress Note  03/30/2023 5:59 PM Raymond [name]  MRN:  [id number] Subjective: 42yo Hispanic male,eports losing his jacket, which contained his wallet and phone, stating, "I can only remember my brother's number." He expresses distress over this loss, as he relies on these items for his daily activities and contacts. He continues to express concerns related to financial stability and support from the [place].The patient denies suicidal ideation (SI) and homicidal ideation (HI). Denies delusions/hallucinations. The patient is experiencing additional stressors related to the loss of personal items and limited memory recall. His slow, garbled speech and inability to remember contacts may indicate an underlying cognitive issue or medication side effect. These symptoms, coupled with financial and logistical concerns, require coordinated support for recovery and stable discharge. Principal Problem: Major depressive disorder, recurrent, severe without psychotic behavior (HCC) Diagnosis: Principal Problem:   Major depressive disorder, recurrent, severe without psychotic behavior (HCC) Active Problems:   Alcohol abuse   Methamphetamine abuse (HCC)  Total Time spent with patient: 1 hour  Past Psychiatric History: Polysubstance Abuse, MDD,  Past Medical History:  Past Medical History:  Diagnosis Date   Anxiety    Hepatitis C    PTSD (post-traumatic stress disorder)     Past Surgical History:  Procedure Laterality Date   FACIAL FRACTURE SURGERY     metal plate under right eye   Family History: History reviewed. No pertinent family history. Family Psychiatric  History: none reported Social History:  Social History   Substance and Sexual Activity  Alcohol Use Yes   Comment: 1 liter bottle of vodka prior to arrival     Social History   Substance and Sexual Activity  Drug Use Yes   Types: Methamphetamines    Social History   Socioeconomic History   Marital status: Single    Spouse name:  Not on file   Number of children: Not on file   Years of education: Not on file   Highest education level: Not on file  Occupational History   Not on file  Tobacco Use   Smoking status: Every Day    Current packs/day: 1.00    Types: Cigarettes    Passive exposure: Current   Smokeless tobacco: Never  Vaping Use   Vaping status: Never Used  Substance and Sexual Activity   Alcohol use: Yes    Comment: 1 liter bottle of vodka prior to arrival   Drug use: Yes    Types: Methamphetamines   Sexual activity: Not Currently  Other Topics Concern   Not on file  Social History Narrative   Not on file   Social Determinants of Health   Financial Resource Strain: Not on File (08/18/2022)   Received from [employer] [employer]    Financial Resource Strain: 0  Food Insecurity: Food Insecurity Present (03/27/2023)   Hunger Vital Sign    Worried About Running Out of Food in the Last Year: Sometimes true    Ran Out of Food in the Last Year: Sometimes true  Transportation Needs: Unmet Transportation Needs (03/27/2023)   PRAPARE - Administrator, Civil Service (Medical): Yes    Lack of Transportation (Non-Medical): Yes  Physical Activity: Not on File (08/18/2022)   Received from [place], [place]   Physical Activity    Physical Activity: 0  Stress: Not on File (08/18/2022)   Received from [facility], [place]   Stress    Stress: 0  Social Connections: Not on File (02/15/2023)   Received from  [facility]   Social Connections    Connectedness: 0   Additional Social History:                         Sleep: Good  Appetite:  Good  Current Medications: Current Facility-Administered Medications  Medication Dose Route Frequency Provider Last Rate Last Admin   albuterol (VENTOLIN HFA) 108 (90 Base) MCG/ACT inhaler 2 puff  2 puff Inhalation Q4H PRN Sarina Ill, DO       alum & mag hydroxide-simeth (MAALOX/MYLANTA) 200-200-20 MG/5ML suspension 30 mL  30 mL Oral  Q4H PRN Sarina Ill, DO       diphenhydrAMINE (BENADRYL) capsule 50 mg  50 mg Oral TID PRN Sarina Ill, DO       Or   diphenhydrAMINE (BENADRYL) injection 50 mg  50 mg Intramuscular TID PRN Sarina Ill, DO       [START ON 03/31/2023] feeding supplement (ENSURE ENLIVE / ENSURE PLUS) liquid 237 mL  1 Bottle Oral BID BM Myriam Forehand, NP       FLUoxetine (PROZAC) capsule 40 mg  40 mg Oral Daily Sarina Ill, DO   40 mg at 03/30/23 0820   gabapentin (NEURONTIN) capsule 100 mg  100 mg Oral TID Charm Rings, NP   100 mg at 03/30/23 1650   haloperidol (HALDOL) tablet 5 mg  5 mg Oral TID PRN Sarina Ill, DO       Or   haloperidol lactate (HALDOL) injection 5 mg  5 mg Intramuscular TID PRN Sarina Ill, DO       hydrOXYzine (ATARAX) tablet 50 mg  50 mg Oral Q6H PRN Sarina Ill, DO       levETIRAcetam (KEPPRA) tablet 750 mg  750 mg Oral Q12H Sarina Ill, DO   750 mg at 03/30/23 0820   LORazepam (ATIVAN) injection 0-4 mg  0-4 mg Intravenous Q12H Sarina Ill, DO       Or   LORazepam (ATIVAN) tablet 0-4 mg  0-4 mg Oral Q12H Sarina Ill, DO   4 mg at 03/30/23 1610   LORazepam (ATIVAN) tablet 2 mg  2 mg Oral TID PRN Sarina Ill, DO       Or   LORazepam (ATIVAN) injection 2 mg  2 mg Intramuscular TID PRN Sarina Ill, DO       magnesium hydroxide (MILK OF MAGNESIA) suspension 30 mL  30 mL Oral Daily PRN Sarina Ill, DO       nicotine polacrilex (NICORETTE) gum 4 mg  4 mg Oral QID PRN Sarina Ill, DO   4 mg at 03/29/23 2130   ondansetron (ZOFRAN-ODT) disintegrating tablet 4 mg  4 mg Oral Q8H PRN Sarina Ill, DO       risperiDONE (RISPERDAL) tablet 2 mg  2 mg Oral BID Sarina Ill, DO   2 mg at 03/30/23 1650   thiamine (VITAMIN B1) tablet 100 mg  100 mg Oral Daily Sarina Ill, DO   100 mg at 03/30/23 0820   Or    thiamine (VITAMIN B1) injection 100 mg  100 mg Intravenous Daily Sarina Ill, DO       traZODone (DESYREL) tablet 100 mg  100 mg Oral QHS Sarina Ill, DO   100 mg at 03/29/23 2130    Lab Results: No results found for this or any previous visit (from the past 48 hour(s)).  Blood Alcohol level:  Lab Results  Component Value Date   ETH 292 (H) 03/26/2023   ETH <10 02/03/2023    Metabolic Disorder Labs: Lab Results  Component Value Date   HGBA1C 5.4 03/28/2023   MPG 108.28 03/28/2023   MPG 111.15 01/26/2023   Lab Results  Component Value Date   PROLACTIN 12.0 01/22/2023   PROLACTIN 19.5 11/20/2022   Lab Results  Component Value Date   CHOL 198 03/28/2023   TRIG 225 (H) 03/28/2023   HDL 69 03/28/2023   CHOLHDL 2.9 03/28/2023   VLDL 45 (H) 03/28/2023   LDLCALC 84 03/28/2023   LDLCALC 56 01/26/2023    Physical Findings: AIMS:  , ,  ,  ,    CIWA:  CIWA-Ar Total: 0 COWS:     Musculoskeletal: Strength & Muscle Tone: within normal limits Gait & Station: normal Patient leans: N/A  Psychiatric Specialty Exam:  Presentation  General Appearance:  Fairly Groomed  Eye Contact: Minimal  Speech: Garbled  Speech Volume: Normal  Handedness: Right   Mood and Affect  Mood: Anxious  Affect: Congruent   Thought Process  Thought Processes: Coherent  Descriptions of Associations:Intact  Orientation:Full (Time, Place and Person) (and situation)  Thought Content:WDL  History of Schizophrenia/Schizoaffective disorder:No  Duration of Psychotic Symptoms: none noted Hallucinations:Hallucinations: None Description of Auditory Hallucinations: none noted Description of Visual Hallucinations: none noted  Ideas of Reference:None  Suicidal Thoughts:Suicidal Thoughts: No SI Active Intent and/or Plan: -- (none noted)  Homicidal Thoughts:Homicidal Thoughts: No   Sensorium  Memory: Immediate Fair; Remote  Fair  Judgment: Fair  Insight: Fair   Art therapist  Concentration: Fair  Attention Span: Fair  Recall: Fair  Fund of Knowledge: Good  Language: Good   Psychomotor Activity  Psychomotor Activity:Psychomotor Activity: Normal   Assets  Assets: Financial Resources/Insurance; Social Support   Sleep  Sleep:Sleep: Good Number of Hours of Sleep: 8    Physical Exam: Physical Exam Vitals and nursing note reviewed.  Constitutional:      Appearance: Normal appearance.  HENT:     Head: Normocephalic and atraumatic.     Nose: Nose normal.  Pulmonary:     Effort: Pulmonary effort is normal.  Musculoskeletal:        General: Normal range of motion.     Cervical back: Normal range of motion.  Neurological:     General: No focal deficit present.     Mental Status: He is alert and oriented to person, place, and time.  Psychiatric:        Attention and Perception: Attention and perception normal.        Mood and Affect: Mood is anxious. Affect is flat.        Speech: Speech is slurred.        Behavior: Behavior normal. Behavior is cooperative.        Thought Content: Thought content normal.        Cognition and Memory: Cognition and memory normal.        Judgment: Judgment normal.    Review of Systems  Psychiatric/Behavioral:  The patient is nervous/anxious.   All other systems reviewed and are negative.  Blood pressure (!) 136/98, pulse 100, temperature 97.9 F (36.6 C), resp. rate 18, height 6\' 1"  (1.854 m), weight 74.4 kg, SpO2 98%. Body mass index is 21.64 kg/m.   Treatment Plan Summary: Daily contact with patient to assess and evaluate symptoms and progress in treatment and Medication management Continue with current medications Prozac  40 mg daily Continue Gabapentin 100 mg BID Continue Risperdal 2 mg daily Continue Trazodone 100 mg nightly monitoring for any side effects that may be impacting speech or cognition. Reevaluate medication regimen  if symptoms of garbled speech and memory limitations persist or worsen. LCSW Coordination: Collaborate with the LCSW to [facility] to [facility] and assist the patient in re-establishing essential contacts, starting with his brother. Explore options for social services to help the patient replace lost identification and access funds if available. Myriam Forehand, NP 03/30/2023, 5:59 PM

## 2023-03-30 NOTE — Group Note (Signed)
Date:  03/30/2023 Time:  9:54 AM  Group Topic/Focus:  Goals Group:   The focus of this group is to help patients establish daily goals to achieve during treatment and discuss how the patient can incorporate goal setting into their daily lives to aide in recovery.    Participation Level:  Active  Participation Quality:  Appropriate  Affect:  Appropriate  Cognitive:  Appropriate  Insight: Appropriate  Engagement in Group:  Engaged  Modes of Intervention:  Discussion, Education, and Support  Additional Comments:    Wilford Corner 03/30/2023, 9:54 AM

## 2023-03-30 NOTE — Progress Notes (Signed)
Patient quiet and presents with flat, worried affect. Denies SI, HI, AVh. Medication compliant. Minimal interaction with staff and peers. Noted walking halls occasionally. Encouragement and support provided. Medications given as prescribed. Pt remains safe on unit with q 15 min checks.

## 2023-03-30 NOTE — Plan of Care (Signed)
  Problem: Education: Goal: Knowledge of [facility] Education information/materials will improve Outcome: Progressing Goal: Emotional status will improve Outcome: Progressing Goal: Mental status will improve Outcome: Progressing Goal: Verbalization of understanding the information provided will improve Outcome: Progressing   Problem: Activity: Goal: Interest or engagement in activities will improve Outcome: Progressing Goal: Sleeping patterns will improve Outcome: Progressing   Problem: Coping: Goal: Ability to verbalize frustrations and anger appropriately will improve Outcome: Progressing Goal: Ability to demonstrate self-control will improve Outcome: Progressing   Problem: Health Behavior/Discharge Planning: Goal: Identification of resources available to assist in meeting health care needs will improve Outcome: Progressing Goal: Compliance with treatment plan for underlying cause of condition will improve Outcome: Progressing   Problem: Physical Regulation: Goal: Ability to maintain clinical measurements within normal limits will improve Outcome: Progressing   Problem: Safety: Goal: Periods of time without injury will increase Outcome: Progressing   Problem: Education: Goal: Knowledge of disease or condition will improve Outcome: Progressing Goal: Understanding of discharge needs will improve Outcome: Progressing   Problem: Physical Regulation: Goal: Complications related to the disease process, condition or treatment will be avoided or minimized Outcome: Progressing   Problem: Education: Goal: Ability to make informed decisions regarding treatment will improve Outcome: Progressing   Problem: Coping: Goal: Coping ability will improve Outcome: Progressing   Problem: Self-Concept: Goal: Ability to identify factors that promote anxiety will improve Outcome: Progressing Goal: Level of anxiety will decrease Outcome: Progressing Goal: Ability to modify  response to factors that promote anxiety will improve Outcome: Progressing

## 2023-03-30 NOTE — Group Note (Signed)
Recreation Therapy Group Note   Group Topic:Goal Setting  Group Date: 03/30/2023 Start Time: 1015 End Time: 1115 Facilitators: Clinton Gallant, CTRS Location:  Craft Room  Group Description: Vision Boards. Patients were given many different magazines, a glue stick, markers, and a piece of cardstock paper. LRT and pts discussed the importance of having goals in life. LRT and pts discussed the difference between short-term and long-term goals, as well as what a SMART goal is. LRT encouraged pts to create a vision board, with images they picked and then cut out with safety scissors from the magazine, for themselves, that capture their short and long-term goals. LRT encouraged pts to show and explain their vision board to the group.   Goal Area(s) Addressed:  Patient will gain knowledge of short vs. long term goals.  Patient will identify goals for themselves. Patient will practice setting SMART goals. Patient will verbalize their goals to LRT and peers.   Affect/Mood: Blunted   Participation Level: Engaged   Participation Quality: Independent   Behavior: Sedated   Speech/Thought Process: Coherent   Insight: Fair   Judgement: Fair    Modes of Intervention: Art   Patient Response to Interventions:  Attentive and Receptive   Education Outcome:  Acknowledges education   Clinical Observations/Individualized Feedback: [place] was mostly active in their participation of session activities and group discussion. Pt identified "I want to eat healthy and have kids some day" as his goals. Pt was noted to be falling asleep sitting up and had heavy eyes while in group. Pt was also noted to have coffee spilling from his mouth and gum falling out.   Plan: Continue to engage patient in RT group sessions 2-3x/week.   Rosina Lowenstein, LRT, CTRS 03/30/2023 11:42 AM

## 2023-03-30 NOTE — Plan of Care (Signed)
  Problem: Education: Goal: Emotional status will improve Outcome: Not Progressing Goal: Mental status will improve Outcome: Not Progressing   Problem: Coping: Goal: Ability to demonstrate self-control will improve Outcome: Progressing   Problem: Safety: Goal: Periods of time without injury will increase Outcome: Progressing

## 2023-03-30 NOTE — Group Note (Signed)
[facility] LCSW Group Therapy Note    Group Date: 03/30/2023 Start Time: 1330 End Time: 1430  Type of Therapy and Topic:  Group Therapy:  Overcoming Obstacles  Participation Level:  [facility] PARTICIPATION LEVEL: Minimal  Mood:  Description of Group:   In this group patients will be encouraged to explore what they see as obstacles to their own wellness and recovery. They will be guided to discuss their thoughts, feelings, and behaviors related to these obstacles. The group will process together ways to cope with barriers, with attention given to specific choices patients can make. Each patient will be challenged to identify changes they are motivated to make in order to overcome their obstacles. This group will be process-oriented, with patients participating in exploration of their own experiences as well as giving and receiving support and challenge from other group members.  Therapeutic Goals: 1. Patient will identify personal and current obstacles as they relate to admission. 2. Patient will identify barriers that currently interfere with their wellness or overcoming obstacles.  3. Patient will identify feelings, thought process and behaviors related to these barriers. 4. Patient will identify two changes they are willing to make to overcome these obstacles:    Summary of Patient Progress Patient was present in group.  Patient provided feedback to others.  Patient struggled with his mindset.  CSW provided education on CBT and the cognitive triangle.  Patient reported understanding.  Insight was poor.     Therapeutic Modalities:   Cognitive Behavioral Therapy Solution Focused Therapy Motivational Interviewing Relapse Prevention Therapy   Harden Mo, LCSW

## 2023-03-30 NOTE — Group Note (Signed)
Date:  03/30/2023 Time:  6:09 PM  Group Topic/Focus:  Outdoor Structured Activity Group    Participation Level:  Active  Participation Quality:  Appropriate  Affect:  Appropriate  Cognitive:  Appropriate  Insight: Appropriate  Engagement in Group:  Engaged  Modes of Intervention:  Activity  Additional Comments:    Raymond [name] 03/30/2023, 6:09 PM

## 2023-03-30 NOTE — Progress Notes (Signed)
   03/30/23 1200  Psych Admission Type (Psych Patients Only)  Admission Status Voluntary  Psychosocial Assessment  Patient Complaints Worrying  Eye Contact Fair  Facial Expression Anxious;Worried  Affect Anxious  Research scientist (physical sciences) Activity Slow  Appearance/Hygiene Unremarkable  Behavior Characteristics Cooperative  Mood Anxious  Thought Process  Coherency Circumstantial  Content Preoccupation  Delusions None reported or observed  Perception WDL  Hallucination None reported or observed  Judgment WDL  Confusion None  Danger to Self  Current suicidal ideation? Denies  Agreement Not to Harm Self Yes  Description of Agreement verbal  Danger to Others  Danger to Others None reported or observed  Danger to Others Abnormal  Harmful Behavior to others No threats or harm toward other people  Destructive Behavior No threats or harm toward property

## 2023-03-30 NOTE — BH IP Treatment Plan (Signed)
Interdisciplinary Treatment and Diagnostic Plan Update  03/30/2023 Time of Session: 9:24AM Raymond [name] MRN: [id number]  Principal Diagnosis: Major depressive disorder, recurrent, severe without psychotic behavior (HCC)  Secondary Diagnoses: Principal Problem:   Major depressive disorder, recurrent, severe without psychotic behavior (HCC) Active Problems:   Alcohol abuse   Methamphetamine abuse (HCC)   Current Medications:  Current Facility-Administered Medications  Medication Dose Route Frequency Provider Last Rate Last Admin   albuterol (VENTOLIN HFA) 108 (90 Base) MCG/ACT inhaler 2 puff  2 puff Inhalation Q4H PRN Sarina Ill, DO       alum & mag hydroxide-simeth (MAALOX/MYLANTA) 200-200-20 MG/5ML suspension 30 mL  30 mL Oral Q4H PRN Sarina Ill, DO       diphenhydrAMINE (BENADRYL) capsule 50 mg  50 mg Oral TID PRN Sarina Ill, DO       Or   diphenhydrAMINE (BENADRYL) injection 50 mg  50 mg Intramuscular TID PRN Sarina Ill, DO       [START ON 03/31/2023] feeding supplement (ENSURE ENLIVE / ENSURE PLUS) liquid 237 mL  1 Bottle Oral BID BM Myriam Forehand, NP       FLUoxetine (PROZAC) capsule 40 mg  40 mg Oral Daily Sarina Ill, DO   40 mg at 03/30/23 0820   gabapentin (NEURONTIN) capsule 100 mg  100 mg Oral TID Charm Rings, NP   100 mg at 03/30/23 1306   haloperidol (HALDOL) tablet 5 mg  5 mg Oral TID PRN Sarina Ill, DO       Or   haloperidol lactate (HALDOL) injection 5 mg  5 mg Intramuscular TID PRN Sarina Ill, DO       hydrOXYzine (ATARAX) tablet 50 mg  50 mg Oral Q6H PRN Sarina Ill, DO       levETIRAcetam (KEPPRA) tablet 750 mg  750 mg Oral Q12H Sarina Ill, DO   750 mg at 03/30/23 0820   LORazepam (ATIVAN) injection 0-4 mg  0-4 mg Intravenous Q12H Sarina Ill, DO       Or   LORazepam (ATIVAN) tablet 0-4 mg  0-4 mg Oral Q12H Sarina Ill, DO   4  mg at 03/30/23 4098   LORazepam (ATIVAN) tablet 2 mg  2 mg Oral TID PRN Sarina Ill, DO       Or   LORazepam (ATIVAN) injection 2 mg  2 mg Intramuscular TID PRN Sarina Ill, DO       magnesium hydroxide (MILK OF MAGNESIA) suspension 30 mL  30 mL Oral Daily PRN Sarina Ill, DO       nicotine polacrilex (NICORETTE) gum 4 mg  4 mg Oral QID PRN Sarina Ill, DO   4 mg at 03/29/23 2130   ondansetron (ZOFRAN-ODT) disintegrating tablet 4 mg  4 mg Oral Q8H PRN Sarina Ill, DO       risperiDONE (RISPERDAL) tablet 2 mg  2 mg Oral BID Sarina Ill, DO   2 mg at 03/30/23 0820   thiamine (VITAMIN B1) tablet 100 mg  100 mg Oral Daily Sarina Ill, DO   100 mg at 03/30/23 0820   Or   thiamine (VITAMIN B1) injection 100 mg  100 mg Intravenous Daily Sarina Ill, DO       traZODone (DESYREL) tablet 100 mg  100 mg Oral QHS Sarina Ill, DO   100 mg at 03/29/23 2130   PTA Medications: Medications Prior to Admission  Medication Sig Dispense Refill Last Dose   albuterol (VENTOLIN HFA) 108 (90 Base) MCG/ACT inhaler Inhale 2 puffs into the lungs every 4 (four) hours as needed for wheezing or shortness of breath. 6.7 g 0    FLUoxetine (PROZAC) 40 MG capsule Take 1 capsule (40 mg total) by mouth daily. (Patient not taking: Reported on 02/04/2023) 30 capsule 0    hydrOXYzine (ATARAX) 50 MG tablet Take 1 tablet (50 mg total) by mouth every 6 (six) hours as needed for anxiety. 10 tablet 0    levETIRAcetam (KEPPRA) 750 MG tablet Take 1 tablet (750 mg total) by mouth every 12 (twelve) hours. 60 tablet 0    lisinopril (ZESTRIL) 10 MG tablet Take 1 tablet (10 mg total) by mouth daily. (Patient not taking: Reported on 02/04/2023) 10 tablet 0    nicotine polacrilex (NICORETTE) 4 MG gum Take 1 each (4 mg total) by mouth 4 (four) times daily as needed for smoking cessation. 100 tablet 0    prazosin (MINIPRESS) 1 MG capsule Take 1  capsule (1 mg total) by mouth at bedtime. (Patient not taking: Reported on 02/04/2023) 30 capsule 0    risperiDONE (RISPERDAL) 2 MG tablet Take 1 tablet (2 mg total) by mouth 2 (two) times daily. 60 tablet 0    traZODone (DESYREL) 100 MG tablet Take 1 tablet (100 mg total) by mouth at bedtime. 30 tablet 0     Patient Stressors: Health problems   Medication change or noncompliance   Substance abuse    Patient Strengths: Careers information officer for treatment/growth   Treatment Modalities: Medication Management, Group therapy, Case management,  1 to 1 session with clinician, Psychoeducation, Recreational therapy.   Physician Treatment Plan for Primary Diagnosis: Major depressive disorder, recurrent, severe without psychotic behavior (HCC) Long Term Goal(s): Improvement in symptoms so as ready for discharge   Short Term Goals: Ability to identify changes in lifestyle to reduce recurrence of condition will improve Ability to verbalize feelings will improve Ability to disclose and discuss suicidal ideas Ability to demonstrate self-control will improve Ability to identify and develop effective coping behaviors will improve Ability to maintain clinical measurements within normal limits will improve Compliance with prescribed medications will improve Ability to identify triggers associated with substance abuse/mental health issues will improve  Medication Management: Evaluate patient's response, side effects, and tolerance of medication regimen.  Therapeutic Interventions: 1 to 1 sessions, Unit Group sessions and Medication administration.  Evaluation of Outcomes: Not Met  Physician Treatment Plan for Secondary Diagnosis: Principal Problem:   Major depressive disorder, recurrent, severe without psychotic behavior (HCC) Active Problems:   Alcohol abuse   Methamphetamine abuse (HCC)  Long Term Goal(s): Improvement in symptoms so as ready for discharge    Short Term Goals: Ability to identify changes in lifestyle to reduce recurrence of condition will improve Ability to verbalize feelings will improve Ability to disclose and discuss suicidal ideas Ability to demonstrate self-control will improve Ability to identify and develop effective coping behaviors will improve Ability to maintain clinical measurements within normal limits will improve Compliance with prescribed medications will improve Ability to identify triggers associated with substance abuse/mental health issues will improve     Medication Management: Evaluate patient's response, side effects, and tolerance of medication regimen.  Therapeutic Interventions: 1 to 1 sessions, Unit Group sessions and Medication administration.  Evaluation of Outcomes: Not Met   RN Treatment Plan for Primary Diagnosis: Major depressive disorder, recurrent, severe without psychotic behavior (HCC)  Long Term Goal(s): Knowledge of disease and therapeutic regimen to maintain health will improve  Short Term Goals: Ability to demonstrate self-control, Ability to participate in decision making will improve, Ability to verbalize feelings will improve, Ability to disclose and discuss suicidal ideas, Ability to identify and develop effective coping behaviors will improve, and Compliance with prescribed medications will improve  Medication Management: RN will administer medications as ordered by provider, will assess and evaluate patient's response and provide education to patient for prescribed medication. RN will report any adverse and/or side effects to prescribing provider.  Therapeutic Interventions: 1 on 1 counseling sessions, Psychoeducation, Medication administration, Evaluate responses to treatment, Monitor vital signs and CBGs as ordered, Perform/monitor CIWA, COWS, AIMS and Fall Risk screenings as ordered, Perform wound care treatments as ordered.  Evaluation of Outcomes: Not Met   LCSW Treatment  Plan for Primary Diagnosis: Major depressive disorder, recurrent, severe without psychotic behavior (HCC) Long Term Goal(s): Safe transition to appropriate next level of care at discharge, Engage patient in therapeutic group addressing interpersonal concerns.  Short Term Goals: Engage patient in aftercare planning with referrals and resources, Increase social support, Increase ability to appropriately verbalize feelings, Increase emotional regulation, Facilitate acceptance of mental health diagnosis and concerns, and Increase skills for wellness and recovery  Therapeutic Interventions: Assess for all discharge needs, 1 to 1 time with Social worker, Explore available resources and support systems, Assess for adequacy in community support network, Educate family and significant other(s) on suicide prevention, Complete Psychosocial Assessment, Interpersonal group therapy.  Evaluation of Outcomes: Not Met   Progress in Treatment: Attending groups: Yes. Participating in groups: Yes. Taking medication as prescribed: Yes. Toleration medication: Yes. Family/Significant other contact made: No, will contact:  once permission is given Patient understands diagnosis: Yes. Discussing patient identified problems/goals with staff: Yes. Medical problems stabilized or resolved: Yes. Denies suicidal/homicidal ideation: Yes. Issues/concerns per patient self-inventory: No. Other: none  New problem(s) identified: No, Describe:  none  New Short Term/Long Term Goal(s): detox, elimination of symptoms of psychosis, medication management for mood stabilization; elimination of SI thoughts; development of comprehensive mental wellness/sobriety plan.   Patient Goals:  "get better"  Discharge Plan or Barriers: CSW to assist patient in development of appropriate discharge plans.  Patient reports that he can return to his home, however, unclear at this time on his providers with the [facility].   Reason for  Continuation of Hospitalization: Anxiety Depression Medication stabilization Suicidal ideation  Estimated Length of Stay:  1-7 days  Last 3 [place] Suicide Severity Risk Score: Flowsheet Row Admission (Current) from 03/27/2023 in [facility] ED from 03/26/2023 in [facility] Department at [facility] ED from 02/03/2023 in [facility] Emergency Department at [facility]  C-SSRS RISK CATEGORY Error: Q7 should not be populated when Q6 is No High Risk Moderate Risk       Last PHQ 2/9 Scores:     No data to display          Scribe for Treatment Team: Harden Mo, Alexander Mt 03/30/2023 4:28 PM

## 2023-03-31 DIAGNOSIS — F332 Major depressive disorder, recurrent severe without psychotic features: Secondary | ICD-10-CM | POA: Diagnosis not present

## 2023-03-31 NOTE — Progress Notes (Signed)
[facility] MD Progress Note  03/31/2023 5:27 PM Raymond [name]  MRN:  [id number] Subjective:  The patient, a 42 year old Hispanic male, expresses a desire to know his discharge timeline, asking, "When am I leaving?" LCSW has coordinated with the patient's brother for discharge planning and is awaiting appointments through [employer].Denies any hallucinations, suicidal ideation (SI), or homicidal ideation (HI). Principal Problem: Major depressive disorder, recurrent, severe without psychotic behavior (HCC) Diagnosis: Principal Problem:   Major depressive disorder, recurrent, severe without psychotic behavior (HCC) Active Problems:   Alcohol abuse   Methamphetamine abuse (HCC)  Total Time spent with patient: 1 hour  Past Psychiatric History: Anxiety  Past Medical History:  Past Medical History:  Diagnosis Date   Anxiety    Hepatitis C    PTSD (post-traumatic stress disorder)     Past Surgical History:  Procedure Laterality Date   FACIAL FRACTURE SURGERY     metal plate under right eye   Family History: History reviewed. No pertinent family history. Family Psychiatric  History: none reported Social History:  Social History   Substance and Sexual Activity  Alcohol Use Yes   Comment: 1 liter bottle of vodka prior to arrival     Social History   Substance and Sexual Activity  Drug Use Yes   Types: Methamphetamines    Social History   Socioeconomic History   Marital status: Single    Spouse name: Not on file   Number of children: Not on file   Years of education: Not on file   Highest education level: Not on file  Occupational History   Not on file  Tobacco Use   Smoking status: Every Day    Current packs/day: 1.00    Types: Cigarettes    Passive exposure: Current   Smokeless tobacco: Never  Vaping Use   Vaping status: Never Used  Substance and Sexual Activity   Alcohol use: Yes    Comment: 1 liter bottle of vodka prior to arrival   Drug use: Yes    Types:  Methamphetamines   Sexual activity: Not Currently  Other Topics Concern   Not on file  Social History Narrative   Not on file   Social Determinants of Health   Financial Resource Strain: Not on File (08/18/2022)   Received from [employer] [employer]    Financial Resource Strain: 0  Food Insecurity: Food Insecurity Present (03/27/2023)   Hunger Vital Sign    Worried About Running Out of Food in the Last Year: Sometimes true    Ran Out of Food in the Last Year: Sometimes true  Transportation Needs: Unmet Transportation Needs (03/27/2023)   PRAPARE - Administrator, Civil Service (Medical): Yes    Lack of Transportation (Non-Medical): Yes  Physical Activity: Not on File (08/18/2022)   Received from [place], [place]   Physical Activity    Physical Activity: 0  Stress: Not on File (08/18/2022)   Received from [facility], [place]   Stress    Stress: 0  Social Connections: Not on File (02/15/2023)   Received from [employer]   Social Connections    Connectedness: 0   Additional Social History:                         Sleep: Good  Appetite:  Good  Current Medications: Current Facility-Administered Medications  Medication Dose Route Frequency Provider Last Rate Last Admin   albuterol (VENTOLIN HFA) 108 (90 Base) MCG/ACT inhaler  2 puff  2 puff Inhalation Q4H PRN Sarina Ill, DO       alum & mag hydroxide-simeth (MAALOX/MYLANTA) 200-200-20 MG/5ML suspension 30 mL  30 mL Oral Q4H PRN Sarina Ill, DO       diphenhydrAMINE (BENADRYL) capsule 50 mg  50 mg Oral TID PRN Sarina Ill, DO       Or   diphenhydrAMINE (BENADRYL) injection 50 mg  50 mg Intramuscular TID PRN Sarina Ill, DO       feeding supplement (ENSURE ENLIVE / ENSURE PLUS) liquid 237 mL  1 Bottle Oral BID BM Myriam Forehand, NP   237 mL at 03/31/23 1447   FLUoxetine (PROZAC) capsule 40 mg  40 mg Oral Daily Sarina Ill, DO   40 mg at 03/31/23  4403   gabapentin (NEURONTIN) capsule 100 mg  100 mg Oral TID Charm Rings, NP   100 mg at 03/31/23 1646   haloperidol (HALDOL) tablet 5 mg  5 mg Oral TID PRN Sarina Ill, DO       Or   haloperidol lactate (HALDOL) injection 5 mg  5 mg Intramuscular TID PRN Sarina Ill, DO       hydrOXYzine (ATARAX) tablet 50 mg  50 mg Oral Q6H PRN Sarina Ill, DO       levETIRAcetam (KEPPRA) tablet 750 mg  750 mg Oral Q12H Sarina Ill, DO   750 mg at 03/31/23 4742   LORazepam (ATIVAN) tablet 2 mg  2 mg Oral TID PRN Sarina Ill, DO       Or   LORazepam (ATIVAN) injection 2 mg  2 mg Intramuscular TID PRN Sarina Ill, DO       magnesium hydroxide (MILK OF MAGNESIA) suspension 30 mL  30 mL Oral Daily PRN Sarina Ill, DO       nicotine polacrilex (NICORETTE) gum 4 mg  4 mg Oral QID PRN Sarina Ill, DO   4 mg at 03/29/23 2130   ondansetron (ZOFRAN-ODT) disintegrating tablet 4 mg  4 mg Oral Q8H PRN Sarina Ill, DO       risperiDONE (RISPERDAL) tablet 2 mg  2 mg Oral BID Sarina Ill, DO   2 mg at 03/31/23 1646   thiamine (VITAMIN B1) tablet 100 mg  100 mg Oral Daily Sarina Ill, DO   100 mg at 03/31/23 5956   Or   thiamine (VITAMIN B1) injection 100 mg  100 mg Intravenous Daily Sarina Ill, DO       traZODone (DESYREL) tablet 100 mg  100 mg Oral QHS Sarina Ill, DO   100 mg at 03/30/23 2118    Lab Results: No results found for this or any previous visit (from the past 48 hour(s)).  Blood Alcohol level:  Lab Results  Component Value Date   ETH 292 (H) 03/26/2023   ETH <10 02/03/2023    Metabolic Disorder Labs:     Physical Findings: AIMS:  , ,  ,  ,    CIWA:  CIWA-Ar Total: 5 COWS:     Musculoskeletal: Strength & Muscle Tone: within normal limits Gait & Station: normal Patient leans: N/A  Psychiatric Specialty Exam:  Presentation  General  Appearance:  Fairly Groomed  Eye Contact: Minimal  Speech: Garbled  Speech Volume: Normal  Handedness: Right   Mood and Affect  Mood: Anxious  Affect: Congruent   Thought Process  Thought Processes: Coherent  Descriptions  of Associations:Intact  Orientation:Full (Time, Place and Person) (and situation)  Thought Content:WDL  History of Schizophrenia/Schizoaffective disorder:No  Duration of Psychotic Symptoms:none noted Hallucinations:Hallucinations: None Description of Auditory Hallucinations: none noted Description of Visual Hallucinations: none noted  Ideas of Reference:None  Suicidal Thoughts:Suicidal Thoughts: No SI Active Intent and/or Plan: -- (none noted)  Homicidal Thoughts:Homicidal Thoughts: No   Sensorium  Memory: Immediate Fair; Remote Fair  Judgment: Fair  Insight: Fair   Art therapist  Concentration: Fair  Attention Span: Fair  Recall: Fair  Fund of Knowledge: Good  Language: Good   Psychomotor Activity  Psychomotor Activity: Psychomotor Activity: Normal   Assets  Assets: Financial Resources/Insurance; Social Support   Sleep  Sleep: Sleep: Good Number of Hours of Sleep: 8    Physical Exam: Physical Exam Vitals and nursing note reviewed.  Constitutional:      Appearance: Normal appearance.  HENT:     Head: Normocephalic.     Nose: Nose normal.  Pulmonary:     Effort: Pulmonary effort is normal.  Musculoskeletal:        General: Normal range of motion.     Cervical back: Normal range of motion.  Neurological:     General: No focal deficit present.     Mental Status: He is alert and oriented to person, place, and time.  Psychiatric:        Attention and Perception: Attention and perception normal.        Mood and Affect: Mood is anxious. Affect is flat.        Speech: Speech normal.        Behavior: Behavior normal. Behavior is cooperative.        Thought Content: Thought content normal.         Cognition and Memory: Cognition and memory normal.        Judgment: Judgment normal.    Review of Systems  Psychiatric/Behavioral:  The patient is nervous/anxious.   All other systems reviewed and are negative.  Blood pressure (!) 128/108, pulse 86, temperature (!) 97 F (36.1 C), resp. rate 19, height 6\' 1"  (1.854 m), weight 74.4 kg, SpO2 98%. Body mass index is 21.64 kg/m.   Treatment Plan Summary: Daily contact with patient to assess and evaluate symptoms and progress in treatment and Medication management Continue with current medications Prozac 40 mg daily Continue Gabapentin 100 mg BID Continue Risperdal 2 mg daily Continue Trazodone 100 mg nightly monitoring for any side effects that may be impacting speech or cognition. Reevaluate medication regimen if symptoms of garbled speech and memory limitations persist or worsen. Myriam Forehand, NP 03/31/2023, 5:27 PM

## 2023-03-31 NOTE — Group Note (Signed)
Date:  03/31/2023 Time:  10:31 AM  Group Topic/Focus:  Building Self Esteem:   The Focus of this group is helping patients become aware of the effects of self-esteem on their lives, the things they and others do that enhance or undermine their self-esteem, seeing the relationship between their level of self-esteem and the choices they make and learning ways to enhance self-esteem. Emotional Education:   The focus of this group is to discuss what feelings/emotions are, and how they are experienced. Managing Feelings:   The focus of this group is to identify what feelings patients have difficulty handling and develop a plan to handle them in a healthier way upon discharge.    Participation Level:  Active  Participation Quality:  Appropriate and Attentive  Affect:  Appropriate  Cognitive:  Alert, Appropriate, and Oriented  Insight: Appropriate  Engagement in Group:  Developing/Improving and Engaged  Modes of Intervention:  Activity, Discussion, Education, and Socialization  Additional Comments:    Rosaura Carpenter 03/31/2023, 10:31 AM

## 2023-03-31 NOTE — Group Note (Signed)
Recreation Therapy Group Note   Group Topic:Self-Esteem  Group Date: 03/31/2023 Start Time: 1010 End Time: 1105 Facilitators: Rosina Lowenstein, LRT, CTRS Location:  Craft Room  Group Description: My strengths and Qualities. Patients and LRT discussed the importance of self-love/self-esteem and things that cause it to fluctuate, including our mental health or state. Pt completed a worksheet that helps them identify 24 different strengths and qualities about themselves. Pt encouraged to read aloud at least 3 off their sheet to the group. LRT and pts discussed how this can be applied to daily life post-discharge.  Pt's then played "Positive Affirmation Bingo" afterwards, with stress balls as prizes.     Goal Area(s) Addressed: Patient will identify positive qualities about themselves. Patient will learn new positive affirmations.  Patient will recite positive qualities and affirmations aloud to the group.  Patient will practice positive self-talk.  Patient will increase communication.  Affect/Mood: Appropriate   Participation Level: Active and Engaged   Participation Quality: Independent   Behavior: Calm and Cooperative   Speech/Thought Process: Coherent   Insight: Good   Judgement: Good   Modes of Intervention: Activity and Worksheet   Patient Response to Interventions:  Attentive, Engaged, Interested , and Receptive   Education Outcome:  Acknowledges education   Clinical Observations/Individualized Feedback: Nevin was active in their participation of session activities and group discussion. Pt identified "challenges I have overcome are the deaths of my mother and my children. One thing I am good at is singing". Pt interacted well with LRT and peers duration of session.    Plan: Continue to engage patient in RT group sessions 2-3x/week.   Rosina Lowenstein, LRT, CTRS 03/31/2023 11:32 AM

## 2023-03-31 NOTE — Group Note (Signed)
Date:  03/31/2023 Time:  6:10 PM  Group Topic/Focus:  Outdoor recreation structured activity    Participation Level:  Active  Participation Quality:  Appropriate  Affect:  Appropriate  Cognitive:  Appropriate  Insight: Appropriate  Engagement in Group:  Developing/Improving  Modes of Intervention:  Activity  Additional Comments:    Evanell Redlich 03/31/2023, 6:10 PM

## 2023-03-31 NOTE — Group Note (Signed)
[facility] LCSW Group Therapy Note   Group Date: 03/31/2023 Start Time: 1315 End Time: 1415  Type of Therapy/Topic:  Group Therapy:  Feelings about Diagnosis  Participation Level:  Active   Description of Group:    This group will allow patients to explore their thoughts and feelings about diagnoses they have received. Patients will be guided to explore their level of understanding and acceptance of these diagnoses. Facilitator will encourage patients to process their thoughts and feelings about the reactions of others to their diagnosis, and will guide patients in identifying ways to discuss their diagnosis with significant others in their lives. This group will be process-oriented, with patients participating in exploration of their own experiences as well as giving and receiving support and challenge from other group members.   Therapeutic Goals: 1. Patient will demonstrate understanding of diagnosis as evidence by identifying two or more symptoms of the disorder:  2. Patient will be able to express two feelings regarding the diagnosis 3. Patient will demonstrate ability to communicate their needs through discussion and/or role plays  Summary of Patient Progress: Patient was present for the entirety of the group process. He was actively engaged in the discussion during his time in the room. Pt appeared open and receptive to feedback/comments from both peers and facilitator.   Therapeutic Modalities:   Cognitive Behavioral Therapy Brief Therapy Feelings Identification    Glenis Smoker, LCSW

## 2023-03-31 NOTE — Group Note (Signed)
Date:  03/31/2023 Time:  3:04 AM  Group Topic/Focus:  Wrap-Up Group:   The focus of this group is to help patients review their daily goal of treatment and discuss progress on daily workbooks.    Participation Level:  Active  Participation Quality:  Appropriate  Affect:  Appropriate  Cognitive:  Alert  Insight: Appropriate  Engagement in Group:  Monopolizing  Modes of Intervention:  Discussion  Additional Comments:     Maglione,Sharren Schnurr E 03/31/2023, 3:04 AM

## 2023-03-31 NOTE — [facility] Counselor (Signed)
CSW spoke with the patient's brother.  Per brother the patient told him that "he had been drinking and had a seizure and fell and hit his head. But he lies so much, I take what he says with a grain of salt."  He reports that he and the patient are not very close.  He reports that this is because of "past behaviors".  He reports that patient has made comments of wanting to harm himself or others but "that's been out of rage".  He reports that "since we lost our mom his whole mental health is not there".    He reports that patient has burned a lot bridges due to theft behaviors.    He reports that he is not aware of access to weapons due to living in [place].    Penni Homans, MSW, LCSW 03/31/2023 10:24 AM

## 2023-03-31 NOTE — Group Note (Signed)
Date:  03/31/2023 Time:  9:12 PM  Group Topic/Focus:  Wrap-Up Group:   The focus of this group is to help patients review their daily goal of treatment and discuss progress on daily workbooks.    Participation Level:  Active  Participation Quality:  Appropriate and Attentive  Affect:  Appropriate  Cognitive:  Alert and Appropriate  Insight: Appropriate and Good  Engagement in Group:  Developing/Improving and Engaged  Modes of Intervention:  Activity, Socialization, and Support  Additional Comments:     Raymond [name] 03/31/2023, 9:12 PM

## 2023-03-31 NOTE — Plan of Care (Signed)
Patient pleasant and cooperative on approach. No issues verbalized. Patient active in the milieu. Denies SI,HI and AVH. Attended groups. Compliant with medications. Appetite and energy level good. Support and encouragement given.

## 2023-03-31 NOTE — [facility] Suicide Risk Assessment (Signed)
[facility] INPATIENT:  Family/Significant Other Suicide Prevention Education  Suicide Prevention Education:  Education Completed; Sandro Crispin, brother, [phone]  has been identified by the patient as the family member/significant other with whom the patient will be residing, and identified as the person(s) who will aid the patient in the event of a mental health crisis (suicidal ideations/suicide attempt).  With written consent from the patient, the family member/significant other has been provided the following suicide prevention education, prior to the and/or following the discharge of the patient.  The suicide prevention education provided includes the following: Suicide risk factors Suicide prevention and interventions National Suicide Hotline telephone number [facility] assessment telephone number [facility] Emergency Assistance 911 [facility] and/or Residential Mobile Crisis Unit telephone number  Request made of family/significant other to: Remove weapons (e.g., guns, rifles, knives), all items previously/currently identified as safety concern.   Remove drugs/medications (over-the-counter, prescriptions, illicit drugs), all items previously/currently identified as a safety concern.  The family member/significant other verbalizes understanding of the suicide prevention education information provided.  The family member/significant other agrees to remove the items of safety concern listed above.  Harden Mo 03/31/2023, 10:09 AM

## 2023-04-01 ENCOUNTER — Inpatient Hospital Stay: Payer: BLUE CROSS/BLUE SHIELD

## 2023-04-01 DIAGNOSIS — F332 Major depressive disorder, recurrent severe without psychotic features: Secondary | ICD-10-CM | POA: Diagnosis not present

## 2023-04-01 LAB — TROPONIN I (HIGH SENSITIVITY): Troponin I (High Sensitivity): 3 ng/L (ref <18)

## 2023-04-01 NOTE — Plan of Care (Signed)
Patient verbalized shortness of breath this morning. PRN inhaler given. Patient noted anxious while taking vital signs states " I know my blood pressure is high." Patient visible in the milieu. Denies SI,HI and AVH. Appetite and energy level good. Support and encouragement given.

## 2023-04-01 NOTE — Group Note (Signed)
Date:  04/01/2023 Time:  6:58 PM  Group Topic/Focus:  Wellness Toolbox:   The focus of this group is to discuss various aspects of wellness, balancing those aspects and exploring ways to increase the ability to experience wellness.  Patients will create a wellness toolbox for use upon discharge.    Participation Level:  Active  Participation Quality:  Appropriate  Affect:  Appropriate  Cognitive:  Appropriate  Insight: Appropriate  Engagement in Group:  Engaged  Modes of Intervention:  Activity  Additional Comments:    Wilford Corner 04/01/2023, 6:58 PM

## 2023-04-01 NOTE — Group Note (Signed)
Date:  04/01/2023 Time:  2:15 PM  Group Topic/Focus:  Goals Group:   The focus of this group is to help patients establish daily goals to achieve during treatment and discuss how the patient can incorporate goal setting into their daily lives to aide in recovery.    Participation Level:  Active  Participation Quality:  Appropriate  Affect:  Appropriate  Cognitive:  Appropriate  Insight: Appropriate  Engagement in Group:  Engaged  Modes of Intervention:  Discussion, Education, and Support  Additional Comments:    Wilford Corner 04/01/2023, 2:15 PM

## 2023-04-01 NOTE — Group Note (Signed)
Recreation Therapy Group Note   Group Topic:Coping Skills  Group Date: 04/01/2023 Start Time: 1010 End Time: 1110 Facilitators: Rosina Lowenstein, LRT, CTRS Location:  Craft Room  Group Description: Mind Map.  Patient was provided a blank template of a diagram with 32 blank boxes in a tiered system, branching from the center (similar to a bubble chart). LRT directed patients to label the middle of the diagram "Coping Skills". LRT and patients then came up with 8 different coping skills as examples. Pt were directed to record their coping skills in the 2nd tier boxes closest to the center.  Patients would then share their coping skills with the group as LRT wrote them out. LRT gave a handout of 99 different coping skills at the end of group.   Goal Area(s) Addressed: Patients will be able to define "coping skills". Patient will identify new coping skills.  Patient will increase communication.   Affect/Mood: Appropriate   Participation Level: Active and Engaged   Participation Quality: Independent   Behavior: Calm and Cooperative   Speech/Thought Process: Coherent   Insight: Good   Judgement: Good   Modes of Intervention: Activity and Worksheet   Patient Response to Interventions:  Attentive, Engaged, Interested , and Receptive   Education Outcome:  Acknowledges education   Clinical Observations/Individualized Feedback: Cloud was active in their participation of session activities and group discussion. Pt identified "positive self talk, processing your emotions and watching tv" as coping skills. Pt interacted well with LRT and peers duration of session.    Plan: Continue to engage patient in RT group sessions 2-3x/week.   Rosina Lowenstein, LRT, CTRS 04/01/2023 11:30 AM

## 2023-04-01 NOTE — Progress Notes (Signed)
[facility] MD Progress Note  04/01/2023 12:52 PM Raymond [name]  MRN:  [id number] Subjective:  42 year old Hispanic male, he patient reports experiencing occasional shortness of breath, stating, "I sometimes have trouble breathing." He reports a history of lung cancer but denies current respiratory distress. He denies any hallucinations, suicidal ideation (SI), or homicidal ideation (HI). The patient does not endorse other acute psychiatric symptoms.eported history of lung cancer, experiencing intermittent shortness of breath. There are no signs of acute respiratory distress observed. Denies any psychiatric symptoms requiring intervention at this time. Principal Problem: Major depressive disorder, recurrent, severe without psychotic behavior (HCC) Diagnosis: Principal Problem:   Major depressive disorder, recurrent, severe without psychotic behavior (HCC) Active Problems:   Alcohol abuse   Methamphetamine abuse (HCC)  Total Time spent with patient: 1.5 hours  Past Psychiatric History: Polysubstance abuse Anxiety  Past Medical History:  Past Medical History:  Diagnosis Date   Anxiety    Hepatitis C    PTSD (post-traumatic stress disorder)     Past Surgical History:  Procedure Laterality Date   FACIAL FRACTURE SURGERY     metal plate under right eye   Family History: History reviewed. No pertinent family history. Family Psychiatric  History: none reported Social History:  Social History   Substance and Sexual Activity  Alcohol Use Yes   Comment: 1 liter bottle of vodka prior to arrival     Social History   Substance and Sexual Activity  Drug Use Yes   Types: Methamphetamines    Social History   Socioeconomic History   Marital status: Single    Spouse name: Not on file   Number of children: Not on file   Years of education: Not on file   Highest education level: Not on file  Occupational History   Not on file  Tobacco Use   Smoking status: Every Day    Current packs/day:  1.00    Types: Cigarettes    Passive exposure: Current   Smokeless tobacco: Never  Vaping Use   Vaping status: Never Used  Substance and Sexual Activity   Alcohol use: Yes    Comment: 1 liter bottle of vodka prior to arrival   Drug use: Yes    Types: Methamphetamines   Sexual activity: Not Currently  Other Topics Concern   Not on file  Social History Narrative   Not on file   Social Determinants of Health   Financial Resource Strain: Not on File (08/18/2022)   Received from [facility] [employer]    Financial Resource Strain: 0  Food Insecurity: Food Insecurity Present (03/27/2023)   Hunger Vital Sign    Worried About Running Out of Food in the Last Year: Sometimes true    Ran Out of Food in the Last Year: Sometimes true  Transportation Needs: Unmet Transportation Needs (03/27/2023)   PRAPARE - Administrator, Civil Service (Medical): Yes    Lack of Transportation (Non-Medical): Yes  Physical Activity: Not on File (08/18/2022)   Received from [place], [place]   Physical Activity    Physical Activity: 0  Stress: Not on File (08/18/2022)   Received from [facility], [place]   Stress    Stress: 0  Social Connections: Not on File (02/15/2023)   Received from [facility]   Social Connections    Connectedness: 0   Additional Social History:  Sleep: Negative  Appetite:  Negative  Current Medications: Current Facility-Administered Medications  Medication Dose Route Frequency Provider Last Rate Last Admin   albuterol (VENTOLIN HFA) 108 (90 Base) MCG/ACT inhaler 2 puff  2 puff Inhalation Q4H PRN Sarina Ill, DO   2 puff at 04/01/23 0814   alum & mag hydroxide-simeth (MAALOX/MYLANTA) 200-200-20 MG/5ML suspension 30 mL  30 mL Oral Q4H PRN Sarina Ill, DO       diphenhydrAMINE (BENADRYL) capsule 50 mg  50 mg Oral TID PRN Sarina Ill, DO       Or   diphenhydrAMINE (BENADRYL) injection 50 mg  50 mg  Intramuscular TID PRN Sarina Ill, DO       feeding supplement (ENSURE ENLIVE / ENSURE PLUS) liquid 237 mL  1 Bottle Oral BID BM Myriam Forehand, NP   237 mL at 03/31/23 1447   FLUoxetine (PROZAC) capsule 40 mg  40 mg Oral Daily Sarina Ill, DO   40 mg at 04/01/23 0757   gabapentin (NEURONTIN) capsule 100 mg  100 mg Oral TID Charm Rings, NP   100 mg at 04/01/23 0757   haloperidol (HALDOL) tablet 5 mg  5 mg Oral TID PRN Sarina Ill, DO       Or   haloperidol lactate (HALDOL) injection 5 mg  5 mg Intramuscular TID PRN Sarina Ill, DO       hydrOXYzine (ATARAX) tablet 50 mg  50 mg Oral Q6H PRN Sarina Ill, DO   50 mg at 04/01/23 0800   levETIRAcetam (KEPPRA) tablet 750 mg  750 mg Oral Q12H Sarina Ill, DO   750 mg at 04/01/23 0757   LORazepam (ATIVAN) tablet 2 mg  2 mg Oral TID PRN Sarina Ill, DO       Or   LORazepam (ATIVAN) injection 2 mg  2 mg Intramuscular TID PRN Sarina Ill, DO       magnesium hydroxide (MILK OF MAGNESIA) suspension 30 mL  30 mL Oral Daily PRN Sarina Ill, DO       nicotine polacrilex (NICORETTE) gum 4 mg  4 mg Oral QID PRN Sarina Ill, DO   4 mg at 03/29/23 2130   ondansetron (ZOFRAN-ODT) disintegrating tablet 4 mg  4 mg Oral Q8H PRN Sarina Ill, DO       risperiDONE (RISPERDAL) tablet 2 mg  2 mg Oral BID Sarina Ill, DO   2 mg at 04/01/23 0757   thiamine (VITAMIN B1) tablet 100 mg  100 mg Oral Daily Sarina Ill, DO   100 mg at 04/01/23 8413   Or   thiamine (VITAMIN B1) injection 100 mg  100 mg Intravenous Daily Sarina Ill, DO       traZODone (DESYREL) tablet 100 mg  100 mg Oral QHS Sarina Ill, DO   100 mg at 03/31/23 2107    Lab Results: No results found for this or any previous visit (from the past 48 hour(s)).  Blood Alcohol level:  Lab Results  Component Value Date   ETH 292 (H)  03/26/2023   ETH <10 02/03/2023    Metabolic Disorder Labs: Lab Results  Component Value Date   HGBA1C 5.4 03/28/2023   MPG 108.28 03/28/2023   MPG 111.15 01/26/2023   Lab Results  Component Value Date   PROLACTIN 12.0 01/22/2023   PROLACTIN 19.5 11/20/2022   Lab Results  Component Value Date   CHOL 198  03/28/2023   TRIG 225 (H) 03/28/2023   HDL 69 03/28/2023   CHOLHDL 2.9 03/28/2023   VLDL 45 (H) 03/28/2023   LDLCALC 84 03/28/2023   LDLCALC 56 01/26/2023    Physical Findings: AIMS:  , ,  ,  ,    CIWA:  CIWA-Ar Total: 5 COWS:     Musculoskeletal: Strength & Muscle Tone: within normal limits Gait & Station: normal Patient leans: N/A  Psychiatric Specialty Exam:  Presentation  General Appearance:  Neat; Appropriate for Environment  Eye Contact: Minimal  Speech: Garbled  Speech Volume: Normal  Handedness: Right   Mood and Affect  Mood: Anxious  Affect: Flat   Thought Process  Thought Processes: Goal Directed  Descriptions of Associations:Intact  Orientation:Full (Time, Place and Person) (and situtaion)  Thought Content:WDL  History of Schizophrenia/Schizoaffective disorder:No  Duration of Psychotic Symptoms:none recorded Hallucinations:Hallucinations: None Description of Auditory Hallucinations: none noted Description of Visual Hallucinations: none noted  Ideas of Reference:None  Suicidal Thoughts:Suicidal Thoughts: No SI Active Intent and/or Plan: -- (none noted)  Homicidal Thoughts:Homicidal Thoughts: No   Sensorium  Memory: Immediate Fair; Remote Fair  Judgment: Poor  Insight: Poor   Executive Functions  Concentration: Good  Attention Span: Fair  Recall: Fair  Fund of Knowledge: Good  Language: Good   Psychomotor Activity  Psychomotor Activity: Psychomotor Activity: Normal   Assets  Assets: Financial Resources/Insurance; Housing   Sleep  Sleep: Sleep: Good Number of Hours of Sleep:  6    Physical Exam: Physical Exam Vitals and nursing note reviewed.  Musculoskeletal:     Cervical back: Normal range of motion.  Neurological:     General: No focal deficit present.     Mental Status: He is oriented to person, place, and time. Mental status is at baseline.  Psychiatric:        Attention and Perception: Attention and perception normal.        Mood and Affect: Mood is anxious. Affect is flat.        Speech: Speech is slurred.        Behavior: Behavior normal. Behavior is cooperative.        Thought Content: Thought content normal.        Cognition and Memory: Cognition and memory normal.        Judgment: Judgment normal.    Review of Systems  Respiratory:  Positive for shortness of breath.   Cardiovascular:  Positive for palpitations.  All other systems reviewed and are negative.  Blood pressure (!) 151/135, pulse (!) 103, temperature 97.8 F (36.6 C), temperature source Oral, resp. rate 18, height 6\' 1"  (1.854 m), weight 74.4 kg, SpO2 97%. Body mass index is 21.64 kg/m.   Treatment Plan Summary: Daily contact with patient to assess and evaluate symptoms and progress in treatment and Medication management Consult with a hospitalist to assess and evaluate the patient's intermittent shortness of breath, considering his history of lung cancer. Further workup may be required to rule out pulmonary complications or progressiing Continue with current medications Prozac 40 mg daily Continue Gabapentin 100 mg BID Continue Risperdal 2 mg daily Continue Trazodone 100 mg nightly monitoring for any side effects that may be impacting speech or cognition. Reevaluate medication regimen if symptoms of garbled speech and memory limitations persist or worsen Myriam Forehand, NP 04/01/2023, 12:52 PM

## 2023-04-01 NOTE — Consult Note (Signed)
Initial Consultation Note   Patient: Raymond [name] ZOX:[id number] DOB: 30-May-1981 PCP: Lavinia Sharps, NP DOA: 03/27/2023 DOS: the patient was seen and examined on 04/01/2023 Primary service: Sarina Ill  Referring physician: Keith Rake Reason for consult: SOB  Assessment/Plan:  Dyspnea -At this time as outpatient, he is in no respiratory distress and talking with other patient without any difficulties.  Lung exam auscultation showed normal breathing sound no wheezing or crackles.  Chest x-ray showed clear fields, no infiltrates or nodules.  Overall, no organic etiology can be identified at this point.  Suspect most of his breathing symptoms probably related to somatization of his psychiatry problems.  Question of lung cancer -No significant record indicating lung cancer.  [employer] patient cannot provide me with details of his diagnostic and/or therapeutic record.  Send request to obtain medical records from [facility] in [place].  Chest pains -Troponin negative x 1 and EKG showed no acute ST changes.  ACS rule out.  Recommend outpatient echocardiogram and stress test if ever indicated. -Suspect chest pain is somatization of his psychiatry problems.   TRH will sign off at present, please call us again when needed.  HPI: Raymond [name] is a 42 y.o. male with past medical history of anxiety/depression, PTSD, admitted to inpatient psychiatry unit to treat depression.  This morning patient complaining about feeling occasional shortness of breath and chest pains.  Initially patient told me that he was diagnosed with lung cancer at [facility] last week after a " special scan".  However during subsequent conversation, patient started to tell me that he was diagnosed with lung cancer 3 years ago in [place] and was referred to " cancer center there".  However again, he could not tell me any details about the name of oncology and the name of the cancer center.  But he denied he  was ever treated with chemo or radiation therapy.  This morning, patient complained about feeling shortness of breath while ambulating and sharp-like chest pains " across her chest" 3-4/10, resolved by its own in few minutes, denies any palpitation, no nauseous vomiting or sweating.  No history of CAD or CHF.  Review of Systems: As mentioned in the history of present illness. All other systems reviewed and are negative. Past Medical History:  Diagnosis Date   Anxiety    Hepatitis C    PTSD (post-traumatic stress disorder)    Past Surgical History:  Procedure Laterality Date   FACIAL FRACTURE SURGERY     metal plate under right eye   Social History:  reports that he has been smoking cigarettes. He has been exposed to tobacco smoke. He has never used smokeless tobacco. He reports current alcohol use. He reports current drug use. Drug: Methamphetamines.  Allergies  Allergen Reactions   Other Itching, Rash and Other (See Comments)    Peanuts   Tylenol [Acetaminophen] Rash    History reviewed. No pertinent family history.  Prior to Admission medications   Medication Sig Start Date End Date Taking? Authorizing Provider  albuterol (VENTOLIN HFA) 108 (90 Base) MCG/ACT inhaler Inhale 2 puffs into the lungs every 4 (four) hours as needed for wheezing or shortness of breath. 10/16/22   Clapacs, Jackquline Denmark, MD  FLUoxetine (PROZAC) 40 MG capsule Take 1 capsule (40 mg total) by mouth daily. Patient not taking: Reported on 02/04/2023 11/08/22   Rex Kras, MD  hydrOXYzine (ATARAX) 50 MG tablet Take 1 tablet (50 mg total) by mouth every 6 (six) hours as needed  for anxiety. 10/16/22   Clapacs, Jackquline Denmark, MD  levETIRAcetam (KEPPRA) 750 MG tablet Take 1 tablet (750 mg total) by mouth every 12 (twelve) hours. 01/29/23 02/28/23  Charm Rings, NP  lisinopril (ZESTRIL) 10 MG tablet Take 1 tablet (10 mg total) by mouth daily. Patient not taking: Reported on 02/04/2023 10/17/22   Clapacs, Jackquline Denmark, MD  nicotine  polacrilex (NICORETTE) 4 MG gum Take 1 each (4 mg total) by mouth 4 (four) times daily as needed for smoking cessation. 11/21/22   Lenard Lance, FNP  prazosin (MINIPRESS) 1 MG capsule Take 1 capsule (1 mg total) by mouth at bedtime. Patient not taking: Reported on 02/04/2023 11/07/22   Rex Kras, MD  risperiDONE (RISPERDAL) 2 MG tablet Take 1 tablet (2 mg total) by mouth 2 (two) times daily. 11/07/22   Rex Kras, MD  traZODone (DESYREL) 100 MG tablet Take 1 tablet (100 mg total) by mouth at bedtime. 11/07/22   Rex Kras, MD    Physical Exam: Vitals:   03/30/23 1824 03/31/23 1636 04/01/23 0807 04/01/23 1256  BP: (!) 140/99 (!) 128/108 (!) 151/135 123/86  Pulse: (!) 101 86 (!) 103 84  Resp: 20 19 18    Temp: 98 F (36.7 C) (!) 97 F (36.1 C) 97.8 F (36.6 C)   TempSrc: Oral  Oral   SpO2: 99% 98% 97% 99%  Weight:      Height:       Eyes: PERRL, lids and conjunctivae normal ENMT: Mucous membranes are moist. Posterior pharynx clear of any exudate or lesions.Normal dentition.  Neck: normal, supple, no masses, no thyromegaly Respiratory: clear to auscultation bilaterally, no wheezing, no crackles. Normal respiratory effort. No accessory muscle use.  Cardiovascular: Regular rate and rhythm, no murmurs / rubs / gallops. No extremity edema. 2+ pedal pulses. No carotid bruits.  Abdomen: no tenderness, no masses palpated. No hepatosplenomegaly. Bowel sounds positive.  Musculoskeletal: no clubbing / cyanosis. No joint deformity upper and lower extremities. Good ROM, no contractures. Normal muscle tone.  Skin: no rashes, lesions, ulcers. No induration Neurologic: CN 2-12 grossly intact. Sensation intact, DTR normal.  Muscle strength 5/5 on both sides Psychiatric: Normal judgment and insight. Alert and oriented x 3. Normal mood.    Data Reviewed:   There are no new results to review at this time.    Family Communication: None Primary team communication: Primary team Thank you very  much for involving [place] in the care of your patient.  Author: Emeline General, MD 04/01/2023 2:18 PM  For on call review [email or web address].

## 2023-04-01 NOTE — Plan of Care (Signed)
  Problem: Education: Goal: Knowledge of [facility] Education information/materials will improve Outcome: Progressing Goal: Emotional status will improve Outcome: Progressing Goal: Mental status will improve Outcome: Progressing Goal: Verbalization of understanding the information provided will improve Outcome: Progressing   Problem: Activity: Goal: Interest or engagement in activities will improve Outcome: Progressing Goal: Sleeping patterns will improve Outcome: Progressing   Problem: Coping: Goal: Ability to verbalize frustrations and anger appropriately will improve Outcome: Progressing Goal: Ability to demonstrate self-control will improve Outcome: Progressing   Problem: Health Behavior/Discharge Planning: Goal: Identification of resources available to assist in meeting health care needs will improve Outcome: Progressing Goal: Compliance with treatment plan for underlying cause of condition will improve Outcome: Progressing   Problem: Physical Regulation: Goal: Ability to maintain clinical measurements within normal limits will improve Outcome: Progressing   Problem: Safety: Goal: Periods of time without injury will increase Outcome: Progressing   Problem: Education: Goal: Knowledge of disease or condition will improve Outcome: Progressing Goal: Understanding of discharge needs will improve Outcome: Progressing   Problem: Physical Regulation: Goal: Complications related to the disease process, condition or treatment will be avoided or minimized Outcome: Progressing   Problem: Education: Goal: Ability to make informed decisions regarding treatment will improve Outcome: Progressing   Problem: Coping: Goal: Coping ability will improve Outcome: Progressing   Problem: Self-Concept: Goal: Ability to identify factors that promote anxiety will improve Outcome: Progressing Goal: Level of anxiety will decrease Outcome: Progressing Goal: Ability to modify  response to factors that promote anxiety will improve Outcome: Progressing

## 2023-04-01 NOTE — Group Note (Signed)
Date:  04/01/2023 Time:  9:20 PM  Group Topic/Focus:  Stages of Change:   The focus of this group is to explain the stages of change and help patients identify changes they want to make upon discharge.    Participation Level:  Active  Participation Quality:  Appropriate and Attentive  Affect:  Appropriate  Cognitive:  Alert and Appropriate  Insight: Appropriate, Good, and Improving  Engagement in Group:  Developing/Improving and Engaged  Modes of Intervention:  Clarification, Discussion, Education, Orientation, Rapport Building, Socialization, and Support  Additional Comments:     Zadie Deemer 04/01/2023, 9:20 PM

## 2023-04-02 NOTE — Plan of Care (Signed)
  Problem: Education: Goal: Emotional status will improve Outcome: Not Progressing Goal: Mental status will improve Outcome: Not Progressing   Problem: Activity: Goal: Sleeping patterns will improve Outcome: Progressing   Problem: Coping: Goal: Ability to verbalize frustrations and anger appropriately will improve Outcome: Progressing   Problem: Safety: Goal: Periods of time without injury will increase Outcome: Progressing

## 2023-04-02 NOTE — Group Note (Signed)
Recreation Therapy Group Note   Group Topic:Other  Group Date: 04/02/2023 Start Time: 1000 End Time: 1100 Facilitators: Rosina Lowenstein, LRT, CTRS Location:  Craft Room  Group Description: Sport and exercise psychologist. Pts were given a card with a Halloween term on it and asked to draw it out on the dry erase board, one at a time and without using words or sound, for the group to guess what they have drawn. Once the image was successfully drawn, the next person would draw their term on the board. 2 mummies were identified voluntarily from the group. The rest of the group was split into two groups. Pts were encouraged to wrap the 2 mummies from head to toe with a roll of toilet paper. The team to do so fastest and with the whole roll of toilet paper, wins.   Goal Area(s) Addressed:  Patient will increase communication skills. Patient will engage in a team-building activity.  Patient will build frustration tolerance skills.  Patient will gain knowledge of an emotional expression activity, like drawing.    Affect/Mood: N/A   Participation Level: Did not attend    Clinical Observations/Individualized Feedback: Hiran did not attend group due to meeting with social work.   Plan: Continue to engage patient in RT group sessions 2-3x/week.   Rosina Lowenstein, LRT, CTRS 04/02/2023 11:27 AM

## 2023-04-02 NOTE — Group Note (Signed)
Date:  04/02/2023 Time:  10:05 PM  Group Topic/Focus:  Making Healthy Choices:   The focus of this group is to help patients identify negative/unhealthy choices they were using prior to admission and identify positive/healthier coping strategies to replace them upon discharge.    Participation Level:  Active  Participation Quality:  Appropriate and Attentive  Affect:  Appropriate and Excited  Cognitive:  Alert and Appropriate  Insight: Appropriate, Good, and Improving  Engagement in Group:  Developing/Improving and Engaged  Modes of Intervention:  Activity, Clarification, Discussion, Education, Problem-solving, Rapport Building, Role-play, Socialization, and Support  Additional Comments:     Vikram Tillett 04/02/2023, 10:05 PM

## 2023-04-02 NOTE — Plan of Care (Signed)
  Problem: Education: Goal: Knowledge of [facility] Education information/materials will improve Outcome: Progressing Goal: Emotional status will improve Outcome: Progressing Goal: Mental status will improve Outcome: Progressing Goal: Verbalization of understanding the information provided will improve Outcome: Progressing   Problem: Activity: Goal: Interest or engagement in activities will improve Outcome: Progressing Goal: Sleeping patterns will improve Outcome: Progressing   Problem: Coping: Goal: Ability to verbalize frustrations and anger appropriately will improve Outcome: Progressing Goal: Ability to demonstrate self-control will improve Outcome: Progressing   Problem: Health Behavior/Discharge Planning: Goal: Identification of resources available to assist in meeting health care needs will improve Outcome: Progressing Goal: Compliance with treatment plan for underlying cause of condition will improve Outcome: Progressing   Problem: Physical Regulation: Goal: Ability to maintain clinical measurements within normal limits will improve Outcome: Progressing   Problem: Safety: Goal: Periods of time without injury will increase Outcome: Progressing   Problem: Education: Goal: Knowledge of disease or condition will improve Outcome: Progressing Goal: Understanding of discharge needs will improve Outcome: Progressing   Problem: Physical Regulation: Goal: Complications related to the disease process, condition or treatment will be avoided or minimized Outcome: Progressing   Problem: Education: Goal: Ability to make informed decisions regarding treatment will improve Outcome: Progressing   Problem: Coping: Goal: Coping ability will improve Outcome: Progressing   Problem: Self-Concept: Goal: Ability to identify factors that promote anxiety will improve Outcome: Progressing Goal: Level of anxiety will decrease Outcome: Progressing Goal: Ability to modify  response to factors that promote anxiety will improve Outcome: Progressing

## 2023-04-02 NOTE — B[facility] Counselor (Signed)
CSW assisted patient in meeting with the ACT team.  Patient was appropriate.    CSW has faxed the requested paperwork to the ACT team.    Penni Homans, MSW, LCSW 04/02/2023 3:54 PM

## 2023-04-02 NOTE — Progress Notes (Signed)
D- Patient alert and oriented x 4. Affect anxious/mood congruent. Denies SI/ HI/ AVH. Patient denies pain. Patient endorces depression and anxiety. His goal is to "focus". A- Scheduled medications administered to patient, per MD orders. Support and encouragement provided.  Routine safety checks conducted every 15 minutes without incident.  Patient informed to notify staff with problems or concerns and verbalizes understanding. R- No adverse drug reactions noted.  Patient compliant with medications and treatment plan. Patient receptive and cooperative. He interacts well with others on the unit.  Patient contracts for safety and  remains safe on the unit at this time.

## 2023-04-02 NOTE — Progress Notes (Signed)
Patient continues to tell staff he has lung cancer. Presents anxious and complains of difficulty breathing. Inhaler given with good relief. Denies SI, HI, AVH. Intrusive at times to staff, but pleasant.  Encouragement and support provided. Safety checks maintained. Medications given as prescribed. Pt receptive and remains safe on unit with q 15 min checks.

## 2023-04-02 NOTE — Group Note (Signed)
Date:  04/02/2023 Time:  7:15 PM  Group Topic/Focus:  Activity Group:  The focus of the group is to encourage activity with the patients and have them go outside to the courtyard for some fresh air and some exercise.    Participation Level:  Active  Participation Quality:  Appropriate  Affect:  Appropriate  Cognitive:  Appropriate  Insight: Appropriate  Engagement in Group:  Engaged  Modes of Intervention:  Activity  Additional Comments:    Raymond [name] 04/02/2023, 7:15 PM

## 2023-04-02 NOTE — Progress Notes (Signed)
[facility] MD Progress Note  04/02/2023 7:43 PM Raymond [name]  MRN:  [id number] Subjective:  42 year old Hispanic male, he patient reports "feeling a lot better" denies SI/HI/SIB, denies delusions/hallucinations. Principal Problem: Major depressive disorder, recurrent, severe without psychotic behavior (HCC) Diagnosis: Principal Problem:   Major depressive disorder, recurrent, severe without psychotic behavior (HCC) Active Problems:   Alcohol abuse   Methamphetamine abuse (HCC)  Total Time spent with patient: 1 hour  Past Psychiatric History: Anxiety Depression  Past Medical History:  Past Medical History:  Diagnosis Date   Anxiety    Hepatitis C    PTSD (post-traumatic stress disorder)     Past Surgical History:  Procedure Laterality Date   FACIAL FRACTURE SURGERY     metal plate under right eye   Family History: History reviewed. No pertinent family history. Family Psychiatric  History: none reported Social History:  Social History   Substance and Sexual Activity  Alcohol Use Yes   Comment: 1 liter bottle of vodka prior to arrival     Social History   Substance and Sexual Activity  Drug Use Yes   Types: Methamphetamines    Social History   Socioeconomic History   Marital status: Single    Spouse name: Not on file   Number of children: Not on file   Years of education: Not on file   Highest education level: Not on file  Occupational History   Not on file  Tobacco Use   Smoking status: Every Day    Current packs/day: 1.00    Types: Cigarettes    Passive exposure: Current   Smokeless tobacco: Never  Vaping Use   Vaping status: Never Used  Substance and Sexual Activity   Alcohol use: Yes    Comment: 1 liter bottle of vodka prior to arrival   Drug use: Yes    Types: Methamphetamines   Sexual activity: Not Currently  Other Topics Concern   Not on file  Social History Narrative   Not on file   Social Determinants of Health   Financial Resource Strain: Not  on File (08/18/2022)   Received from [facility] [employer]    Financial Resource Strain: 0  Food Insecurity: Food Insecurity Present (03/27/2023)   Hunger Vital Sign    Worried About Running Out of Food in the Last Year: Sometimes true    Ran Out of Food in the Last Year: Sometimes true  Transportation Needs: Unmet Transportation Needs (03/27/2023)   PRAPARE - Administrator, Civil Service (Medical): Yes    Lack of Transportation (Non-Medical): Yes  Physical Activity: Not on File (08/18/2022)   Received from [place], [place]   Physical Activity    Physical Activity: 0  Stress: Not on File (08/18/2022)   Received from [facility], [place]   Stress    Stress: 0  Social Connections: Not on File (02/15/2023)   Received from [facility] [facility]   Social Connections    Connectedness: 0   Additional Social History:                         Sleep: Good  Appetite:  Good  Current Medications: Current Facility-Administered Medications  Medication Dose Route Frequency Provider Last Rate Last Admin   albuterol (VENTOLIN HFA) 108 (90 Base) MCG/ACT inhaler 2 puff  2 puff Inhalation Q4H PRN Sarina Ill, DO   2 puff at 04/01/23 2105   alum & mag hydroxide-simeth (MAALOX/MYLANTA) 200-200-20 MG/5ML suspension 30  mL  30 mL Oral Q4H PRN Sarina Ill, DO       diphenhydrAMINE (BENADRYL) capsule 50 mg  50 mg Oral TID PRN Sarina Ill, DO       Or   diphenhydrAMINE (BENADRYL) injection 50 mg  50 mg Intramuscular TID PRN Sarina Ill, DO       feeding supplement (ENSURE ENLIVE / ENSURE PLUS) liquid 237 mL  1 Bottle Oral BID BM Myriam Forehand, NP   237 mL at 04/02/23 1534   FLUoxetine (PROZAC) capsule 40 mg  40 mg Oral Daily Sarina Ill, DO   40 mg at 04/02/23 0730   gabapentin (NEURONTIN) capsule 100 mg  100 mg Oral TID Charm Rings, NP   100 mg at 04/02/23 1746   haloperidol (HALDOL) tablet 5 mg  5 mg Oral TID PRN Sarina Ill, DO       Or   haloperidol lactate (HALDOL) injection 5 mg  5 mg Intramuscular TID PRN Sarina Ill, DO       hydrOXYzine (ATARAX) tablet 50 mg  50 mg Oral Q6H PRN Sarina Ill, DO   50 mg at 04/01/23 0800   levETIRAcetam (KEPPRA) tablet 750 mg  750 mg Oral Q12H Sarina Ill, DO   750 mg at 04/02/23 0730   LORazepam (ATIVAN) tablet 2 mg  2 mg Oral TID PRN Sarina Ill, DO       Or   LORazepam (ATIVAN) injection 2 mg  2 mg Intramuscular TID PRN Sarina Ill, DO       magnesium hydroxide (MILK OF MAGNESIA) suspension 30 mL  30 mL Oral Daily PRN Sarina Ill, DO       nicotine polacrilex (NICORETTE) gum 4 mg  4 mg Oral QID PRN Sarina Ill, DO   4 mg at 04/02/23 1856   ondansetron (ZOFRAN-ODT) disintegrating tablet 4 mg  4 mg Oral Q8H PRN Sarina Ill, DO       risperiDONE (RISPERDAL) tablet 2 mg  2 mg Oral BID Sarina Ill, DO   2 mg at 04/02/23 1746   thiamine (VITAMIN B1) tablet 100 mg  100 mg Oral Daily Sarina Ill, DO   100 mg at 04/02/23 0730   Or   thiamine (VITAMIN B1) injection 100 mg  100 mg Intravenous Daily Sarina Ill, DO       traZODone (DESYREL) tablet 100 mg  100 mg Oral QHS Sarina Ill, DO   100 mg at 04/01/23 2105    Lab Results:  Results for orders placed or performed during the hospital encounter of 03/27/23 (from the past 48 hour(s))  Troponin I (High Sensitivity)     Status: None   Collection Time: 04/01/23  1:13 PM  Result Value Ref Range   Troponin I (High Sensitivity) 3 <18 ng/L    Comment: (NOTE) Elevated high sensitivity troponin I (hsTnI) values and significant  changes across serial measurements may suggest ACS but many other  chronic and acute conditions are known to elevate hsTnI results.  Refer to the "Links" section for chest pain algorithms and additional  guidance. Performed at [facility], [address] [place], [place] [address]     Blood Alcohol level:  Lab Results  Component Value Date   ETH 292 (H) 03/26/2023   ETH <10 02/03/2023    Metabolic Disorder Labs: Lab Results  Component Value Date   HGBA1C 5.4 03/28/2023  MPG 108.28 03/28/2023   MPG 111.15 01/26/2023   Lab Results  Component Value Date   PROLACTIN 12.0 01/22/2023   PROLACTIN 19.5 11/20/2022   Lab Results  Component Value Date   CHOL 198 03/28/2023   TRIG 225 (H) 03/28/2023   HDL 69 03/28/2023   CHOLHDL 2.9 03/28/2023   VLDL 45 (H) 03/28/2023   LDLCALC 84 03/28/2023   LDLCALC 56 01/26/2023    Physical Findings: AIMS:  , ,  ,  ,    CIWA:  CIWA-Ar Total: 5 COWS:     Musculoskeletal: Strength & Muscle Tone: within normal limits Gait & Station: normal Patient leans: N/A  Psychiatric Specialty Exam:  Presentation  General Appearance:  Appropriate for Environment; Neat  Eye Contact: Good  Speech: Normal Rate (mumbles at times)  Speech Volume: Normal  Handedness: Right   Mood and Affect  Mood: Euphoric  Affect: Appropriate   Thought Process  Thought Processes: Coherent  Descriptions of Associations:Intact  Orientation:Full (Time, Place and Person) (and situation)  Thought Content:WDL  History of Schizophrenia/Schizoaffective disorder:No  Duration of Psychotic Symptoms:No data recorded Hallucinations:Hallucinations: None Description of Auditory Hallucinations: none noted Description of Visual Hallucinations: none noted  Ideas of Reference:None  Suicidal Thoughts:Suicidal Thoughts: No SI Active Intent and/or Plan: -- (none noted)  Homicidal Thoughts:Homicidal Thoughts: No   Sensorium  Memory: Immediate Fair; Remote Fair  Judgment: Fair  Insight: Fair   Art therapist  Concentration: Good  Attention Span: Fair  Recall: Good  Fund of Knowledge: Good  Language: Good   Psychomotor Activity  Psychomotor Activity: Psychomotor  Activity: Normal   Assets  Assets: Communication Skills; Financial Resources/Insurance   Sleep  Sleep: Sleep: Good Number of Hours of Sleep: 8    Physical Exam: Physical Exam Vitals and nursing note reviewed.  HENT:     Head: Normocephalic and atraumatic.     Nose: Nose normal.  Pulmonary:     Effort: Pulmonary effort is normal.  Musculoskeletal:        General: Normal range of motion.     Cervical back: Normal range of motion.  Neurological:     General: No focal deficit present.     Mental Status: He is alert and oriented to person, place, and time.  Psychiatric:        Attention and Perception: Attention and perception normal.        Mood and Affect: Mood and affect normal.        Speech: Speech normal.        Behavior: Behavior normal.        Thought Content: Thought content normal.        Cognition and Memory: Cognition and memory normal.        Judgment: Judgment normal.    Review of Systems  Psychiatric/Behavioral:  The patient is nervous/anxious.   All other systems reviewed and are negative.  Blood pressure (!) 122/109, pulse 91, temperature 98.2 F (36.8 C), resp. rate 18, height 6\' 1"  (1.854 m), weight 74.4 kg, SpO2 98%. Body mass index is 21.64 kg/m.   Treatment Plan Summary: Daily contact with patient to assess and evaluate symptoms and progress in treatment and Medication management Continue with current medications Prozac 40 mg daily Continue Gabapentin 100 mg BID Continue Risperdal 2 mg daily Continue Trazodone 100 mg nightly monitoring for any side effects that may be impacting speech or cognition. Reevaluate medication regimen if symptoms of garbled speech and memory limitations persist or worsen Myriam Forehand, NP  04/02/2023, 7:43 PM

## 2023-04-02 NOTE — Group Note (Signed)
Date:  04/02/2023 Time:  6:36 PM  Group Topic/Focus:  Goals Group:   The focus of this group is to help patients establish daily goals to achieve during treatment and discuss how the patient can incorporate goal setting into their daily lives to aide in recovery.   Participation Level:  Active  Participation Quality:  Appropriate and Attentive  Affect:  Appropriate  Cognitive:  Appropriate  Insight: Appropriate  Engagement in Group:  Engaged  Modes of Intervention:  Discussion and Education  Additional Comments:    Caresse Sedivy A Lorece Keach 04/02/2023, 6:36 PM

## 2023-04-03 MED ORDER — RISPERIDONE 2 MG PO TABS: 2.0000 mg | ORAL_TABLET | Freq: Every day | ORAL | 0 refills | Status: DC

## 2023-04-03 MED ORDER — VITAMIN B-1 100 MG PO TABS: 100.0000 mg | ORAL_TABLET | Freq: Every day | ORAL | 0 refills | Status: DC

## 2023-04-03 MED ORDER — GABAPENTIN 100 MG PO CAPS: 100.0000 mg | ORAL_CAPSULE | Freq: Three times a day (TID) | ORAL | 1 refills | Status: DC

## 2023-04-03 MED ORDER — NICOTINE POLACRILEX 4 MG MT GUM: 4.0000 mg | CHEWING_GUM | Freq: Four times a day (QID) | OROMUCOSAL | 0 refills | Status: DC | PRN

## 2023-04-03 NOTE — Progress Notes (Signed)
  [facility] Adult Case Management Discharge Plan :  Will you be returning to the same living situation after discharge:  Yes,  pt reports that he is returning home At discharge, do you have transportation home?: Yes,  CSW to assist with transportation needs Do you have the ability to pay for your medications: Yes,  BLUE CROSS BLUE SHIELD / BCBS OTHER  Release of information consent forms completed and in the chart;  Patient's signature needed at discharge.  Patient to Follow up at:  Follow-up Information     [facility] Follow up.   Specialty: Professional Counselor Why: Walk in hours are Monday through Friday 9AM to 1PM.  You will need to be restablished as a patient. Contact information: [facility] [place] [address] [place] [address] [phone]                 Next level of care provider has access to [facility] Link:no  Safety Planning and Suicide Prevention discussed: Yes,  SPE completed with pt and brother     Has patient been referred to the Quitline?: Patient refused referral for treatment  Patient has been referred for addiction treatment: Patient refused referral for treatment.  Harden Mo, LCSW 04/03/2023, 2:05 PM

## 2023-04-03 NOTE — Plan of Care (Signed)
  Problem: Coping: Goal: Ability to verbalize frustrations and anger appropriately will improve Outcome: Progressing Goal: Ability to demonstrate self-control will improve Outcome: Progressing   

## 2023-04-03 NOTE — Discharge Summary (Signed)
Physician Discharge Summary Note  Patient:  Raymond [name] is an 42 y.o., male MRN:  [id number] DOB:  27-Sep-1980 Patient phone:  [phone] (home)  Patient address:   [address] [place] [address],  Total Time spent with patient: 1.5 hours  Date of Admission:  03/27/2023 Date of Discharge: 04/04/2023  Reason for Admission:  42 year old Hispanic male, Suicidal ideation, substance use, seizure management concerns, and acute exacerbation of depression, anxiety, and PTSD symptoms. The patient was admitted with reported suicidal ideation, recent use of alcohol and methamphetamine, and concerns related to seizure management due to noncompliance with Keppra. He expressed depressive and anxiety symptoms, rated at 6/10, but denied recent suicidal ideation at the time of assessment. The patient also reported experiencing occasional nightmares and flashbacks linked to PTSD. He exhibited somatic symptoms, including reported chest pain and shortness of breath, suspected to be psychosomatic.  Principal Problem: Major depressive disorder, recurrent, severe without psychotic behavior (HCC) Discharge Diagnoses: Principal Problem:   Major depressive disorder, recurrent, severe without psychotic behavior (HCC) Active Problems:   Alcohol abuse   Methamphetamine abuse (HCC)   Past Psychiatric History: Depression  Past Medical History:  Past Medical History:  Diagnosis Date   Anxiety    Hepatitis C    PTSD (post-traumatic stress disorder)     Past Surgical History:  Procedure Laterality Date   FACIAL FRACTURE SURGERY     metal plate under right eye   Family History: History reviewed. No pertinent family history. Family Psychiatric  History: none noted Social History:  Social History   Substance and Sexual Activity  Alcohol Use Yes   Comment: 1 liter bottle of vodka prior to arrival     Social History   Substance and Sexual Activity  Drug Use Yes   Types:  Methamphetamines    Social History   Socioeconomic History   Marital status: Single    Spouse name: Not on file   Number of children: Not on file   Years of education: Not on file   Highest education level: Not on file  Occupational History   Not on file  Tobacco Use   Smoking status: Every Day    Current packs/day: 1.00    Types: Cigarettes    Passive exposure: Current   Smokeless tobacco: Never  Vaping Use   Vaping status: Never Used  Substance and Sexual Activity   Alcohol use: Yes    Comment: 1 liter bottle of vodka prior to arrival   Drug use: Yes    Types: Methamphetamines   Sexual activity: Not Currently  Other Topics Concern   Not on file  Social History Narrative   Not on file   Social Determinants of Health   Financial Resource Strain: Not on File (08/18/2022)   Received from [facility]    Financial Resource Strain: 0  Food Insecurity: Food Insecurity Present (03/27/2023)   Hunger Vital Sign    Worried About Running Out of Food in the Last Year: Sometimes true    Ran Out of Food in the Last Year: Sometimes true  Transportation Needs: Unmet Transportation Needs (03/27/2023)   PRAPARE - Administrator, Civil Service (Medical): Yes    Lack of Transportation (Non-Medical): Yes  Physical Activity: Not on File (08/18/2022)   Received from [place], [place]   Physical Activity    Physical Activity: 0  Stress: Not on File (08/18/2022)   Received from [place], [place]   Stress  Stress: 0  Social Connections: Not on File (02/15/2023)   Received from [facility]   Social Connections    Connectedness: 0    Hospital Course:  The patient was stabilized with psychiatric medications, including Lexapro for depression and anxiety, Trazodone for sleep, Gabapentin for mood stabilization, and Risperdal as needed. He participated in daily therapeutic activities and counseling sessions and showed a willingness to explore further treatment options.  Restarted Keppra 500 mg twice daily to address gaps in seizure control. The patient reported dizziness, but no other acute symptoms or complications during his stay. A chest x-ray and cardiac workup, including troponin and EKG, were conducted due to chest pain complaints, with no significant findings, suggesting somatization. Education on the risks of substance use, particularly methamphetamine, was provided, and he was encouraged to engage in rehabilitation post-discharge. Social Support and Discharge Planning: Coordination with social services and a licensed clinical social worker was initiated to help secure stable housing options and explore additional social support resources.  Physical Findings: AIMS:  , ,  ,  ,    CIWA:  CIWA-Ar Total: 5 COWS:     Musculoskeletal: Strength & Muscle Tone: within normal limits Gait & Station: normal Patient leans: N/A   Psychiatric Specialty Exam:  Presentation  General Appearance:  Appropriate for Environment; Fairly Groomed  Eye Contact: Good  Speech: Clear and Coherent; Normal Rate  Speech Volume: Normal  Handedness: Right   Mood and Affect  Mood: Euthymic  Affect: Appropriate; Congruent   Thought Process  Thought Processes: Coherent  Descriptions of Associations:Intact  Orientation:Full (Time, Place and Person) (and situation)  Thought Content:Logical; WDL  History of Schizophrenia/Schizoaffective disorder:No  Duration of Psychotic Symptoms: none noted Hallucinations:Hallucinations: None Description of Auditory Hallucinations: denies Description of Visual Hallucinations: denies  Ideas of Reference:None  Suicidal Thoughts:Suicidal Thoughts: No SI Active Intent and/or Plan: -- (none noted)  Homicidal Thoughts:Homicidal Thoughts: No   Sensorium  Memory: Immediate Good; Remote Good  Judgment: Fair  Insight: Fair   Art therapist  Concentration: Good  Attention  Span: Fair  Recall: Good  Fund of Knowledge: Good  Language: Good   Psychomotor Activity  Psychomotor Activity: Psychomotor Activity: Normal   Assets  Assets: Financial Resources/Insurance; Communication Skills; Housing   Sleep  Sleep: Sleep: Good Number of Hours of Sleep: 7    Physical Exam: Physical Exam Vitals and nursing note reviewed.  Constitutional:      Appearance: Normal appearance.  HENT:     Head: Normocephalic and atraumatic.     Nose: Nose normal.  Pulmonary:     Effort: Pulmonary effort is normal.  Musculoskeletal:        General: Normal range of motion.     Cervical back: Normal range of motion.  Neurological:     General: No focal deficit present.     Mental Status: He is alert and oriented to person, place, and time.  Psychiatric:        Attention and Perception: Attention and perception normal.        Mood and Affect: Mood and affect normal.        Speech: Speech normal.        Behavior: Behavior normal. Behavior is cooperative.        Thought Content: Thought content normal.        Cognition and Memory: Cognition and memory normal.        Judgment: Judgment normal.    Review of Systems  All other systems reviewed and are negative.  Blood pressure 126/86, pulse 85, temperature 98.4 F (36.9 C), resp. rate 18, height 6\' 1"  (1.854 m), weight 74.4 kg, SpO2 96%. Body mass index is 21.64 kg/m.   Social History   Tobacco Use  Smoking Status Every Day   Current packs/day: 1.00   Types: Cigarettes   Passive exposure: Current  Smokeless Tobacco Never   Tobacco Cessation:  A prescription for an FDA-approved tobacco cessation medication provided at discharge   Blood Alcohol level:  Lab Results  Component Value Date   ETH 292 (H) 03/26/2023   ETH <10 02/03/2023    Metabolic Disorder Labs:  Lab Results  Component Value Date   HGBA1C 5.4 03/28/2023   MPG 108.28 03/28/2023   MPG 111.15 01/26/2023   Lab Results  Component  Value Date   PROLACTIN 12.0 01/22/2023   PROLACTIN 19.5 11/20/2022   Lab Results  Component Value Date   CHOL 198 03/28/2023   TRIG 225 (H) 03/28/2023   HDL 69 03/28/2023   CHOLHDL 2.9 03/28/2023   VLDL 45 (H) 03/28/2023   LDLCALC 84 03/28/2023   LDLCALC 56 01/26/2023    See Psychiatric Specialty Exam and Suicide Risk Assessment completed by Attending Physician prior to discharge.  Discharge destination:  Home  Is patient on multiple antipsychotic therapies at discharge:  No   Has Patient had three or more failed trials of antipsychotic monotherapy by history:  No  Recommended Plan for Multiple Antipsychotic Therapies: NA   Allergies as of 04/03/2023       Reactions   Other Itching, Rash, Other (See Comments)   Peanuts   Tylenol [acetaminophen] Rash        Medication List     STOP taking these medications    prazosin 1 MG capsule Commonly known as: MINIPRESS       TAKE these medications      Indication  albuterol 108 (90 Base) MCG/ACT inhaler Commonly known as: VENTOLIN HFA Inhale 2 puffs into the lungs every 4 (four) hours as needed for wheezing or shortness of breath.  Indication: Chronic Obstructive Lung Disease   FLUoxetine 40 MG capsule Commonly known as: PROZAC Take 1 capsule (40 mg total) by mouth daily.  Indication: Depression   gabapentin 100 MG capsule Commonly known as: NEURONTIN Take 1 capsule (100 mg total) by mouth 3 (three) times daily.  Indication: Abuse or Misuse of Alcohol, Neuropathic Pain   hydrOXYzine 50 MG tablet Commonly known as: ATARAX Take 1 tablet (50 mg total) by mouth every 6 (six) hours as needed for anxiety.  Indication: Feeling Anxious   levETIRAcetam 750 MG tablet Commonly known as: KEPPRA Take 1 tablet (750 mg total) by mouth every 12 (twelve) hours.  Indication: Partial Onset Seizure   lisinopril 10 MG tablet Commonly known as: ZESTRIL Take 1 tablet (10 mg total) by mouth daily.  Indication: High Blood  Pressure   nicotine polacrilex 4 MG gum Commonly known as: NICORETTE Take 1 each (4 mg total) by mouth 4 (four) times daily as needed for smoking cessation.  Indication: Nicotine Addiction   risperiDONE 2 MG tablet Commonly known as: RISPERDAL Take 1 tablet (2 mg total) by mouth at bedtime. What changed: when to take this  Indication: Major Depressive Disorder   thiamine 100 MG tablet Commonly known as: Vitamin B-1 Take 1 tablet (100 mg total) by mouth daily. Start taking on: April 04, 2023  Indication: Deficiency of Vitamin B1   traZODone 100 MG tablet Commonly known as: DESYREL Take 1  tablet (100 mg total) by mouth at bedtime.  Indication: Trouble Sleeping        Follow-up Information     [facility] Follow up.   Specialty: Professional Counselor Why: Walk in hours are Monday through Friday 9AM to 1PM.  You will need to be restablished as a patient. Contact information: [facility] [address] [place] [address] [phone]                 Follow-up recommendations:  Activity:  as tolerated Diet:  heart healthy  Comments:   Prozac 40 mg daily for mood stabilization and management of depressive symptoms. Gabapentin 100 mg BID for mood stability and anxiety. Risperdal 2 mg daily to address psychotic features or severe anxiety as needed. Trazodone 100 mg nightly for sleep support, with monitoring for potential side effects impacting cognition or speech. Provide information on rehab programs and coordinate referral as needed.  Referral to [facility] [place] for ongoing therapy and support. Contact details: Address: [address], [place], [place] [address] Phone: [phone] Walk-in Hours: Monday to Friday, 9 AM - 1 PM ontinue collaboration with the [place] for disability services and possible support for substance use and mental health treatment. Patient is to schedule outpatient  echocardiogram and stress test if chest pain persists or symptoms increase.  Signed: Myriam Forehand, NP 04/03/2023, 4:01 PM

## 2023-04-03 NOTE — Group Note (Signed)
Date:  04/03/2023 Time:  3:33 PM  Group Topic/Focus:  Activity Group:  The focus of the group is to promote activity for the patients and allow them to go outside in the courtyard for some fresh air and some exercise.    Participation Level:  Active  Participation Quality:  Appropriate  Affect:  Appropriate  Cognitive:  Appropriate  Insight: Appropriate  Engagement in Group:  Engaged  Modes of Intervention:  Activity  Additional Comments:    Mary Sella Doreather Hoxworth 04/03/2023, 3:33 PM

## 2023-04-03 NOTE — BHH Suicide Risk Assessment (Signed)
[facility] Discharge Suicide Risk Assessment   Principal Problem: Major depressive disorder, recurrent, severe without psychotic behavior (HCC) Discharge Diagnoses: Principal Problem:   Major depressive disorder, recurrent, severe without psychotic behavior (HCC) Active Problems:   Alcohol abuse   Methamphetamine abuse (HCC)   Total Time spent with patient: 45 minutes  Musculoskeletal: Strength & Muscle Tone: within normal limits Gait & Station: normal Patient leans: N/A  Psychiatric Specialty Exam  Presentation  General Appearance:  Appropriate for Environment; Fairly Groomed  Eye Contact: Good  Speech: Clear and Coherent; Normal Rate  Speech Volume: Normal  Handedness: Right   Mood and Affect  Mood: Euthymic  Duration of Depression Symptoms: Greater than two weeks  Affect: Appropriate; Congruent   Thought Process  Thought Processes: Coherent  Descriptions of Associations:Intact  Orientation:Full (Time, Place and Person) (and situation)  Thought Content:Logical; WDL  History of Schizophrenia/Schizoaffective disorder:No  Duration of Psychotic Symptoms:No data recorded Hallucinations:Hallucinations: None Description of Auditory Hallucinations: denies Description of Visual Hallucinations: denies  Ideas of Reference:None  Suicidal Thoughts:Suicidal Thoughts: No SI Active Intent and/or Plan: -- (none noted)  Homicidal Thoughts:Homicidal Thoughts: No   Sensorium  Memory: Immediate Good; Remote Good  Judgment: Fair  Insight: Fair   Art therapist  Concentration: Good  Attention Span: Fair  Recall: Good  Fund of Knowledge: Good  Language: Good   Psychomotor Activity  Psychomotor Activity: Psychomotor Activity: Normal   Assets  Assets: Financial Resources/Insurance; Communication Skills; Housing   Sleep  Sleep: Sleep: Good Number of Hours of Sleep: 7   Physical Exam: Physical Exam Vitals and nursing note  reviewed.  Constitutional:      Appearance: Normal appearance.  HENT:     Head: Normocephalic and atraumatic.     Nose: Nose normal.  Pulmonary:     Effort: Pulmonary effort is normal.  Musculoskeletal:        General: Normal range of motion.     Cervical back: Normal range of motion and neck supple.  Neurological:     Mental Status: He is alert.  Psychiatric:        Attention and Perception: Attention and perception normal.        Mood and Affect: Affect normal. Mood is anxious.        Speech: Speech normal.        Behavior: Behavior normal. Behavior is cooperative.        Thought Content: Thought content normal.        Cognition and Memory: Cognition and memory normal.        Judgment: Judgment normal.     Comments: About discharge    Review of Systems  All other systems reviewed and are negative.  Blood pressure 126/86, pulse 85, temperature 98.4 F (36.9 C), resp. rate 18, height 6\' 1"  (1.854 m), weight 74.4 kg, SpO2 96%. Body mass index is 21.64 kg/m.  Mental Status Per Nursing Assessment::   On Admission:  NA  Demographic Factors:  Male, Low socioeconomic status, Living alone, and Unemployed  Loss Factors: Decrease in vocational status and Financial problems/change in socioeconomic status  Historical Factors: Impulsivity  Risk Reduction Factors:   Religious beliefs about death, Positive social support, and Positive coping skills or problem solving skills  Continued Clinical Symptoms:  Depression:   Impulsivity Insomnia Alcohol/Substance Abuse/Dependencies More than one psychiatric diagnosis Previous Psychiatric Diagnoses and Treatments  Cognitive Features That Contribute To Risk:  None    Suicide Risk:  Minimal: No identifiable suicidal ideation.  Patients presenting with  no risk factors but with morbid ruminations; may be classified as minimal risk based on the severity of the depressive symptoms   Follow-up Information     [facility] Follow up.   Specialty: Professional Counselor Why: Walk in hours are Monday through Friday 9AM to 1PM.  You will need to be restablished as a patient. Contact information: [facility] [address] [place] [address] [phone]                 Plan Of Care/Follow-up recommendations:  Activity:  as tolerated Diet:  heart healthy Prozac 40 mg daily for mood stabilization and management of depressive symptoms. Gabapentin 100 mg BID for mood stability and anxiety. Risperdal 2 mg daily to address psychotic features or severe anxiety as needed. Trazodone 100 mg nightly for sleep support, with monitoring for potential side effects impacting cognition or speech. Provide information on rehab programs and coordinate referral as needed.  Referral to [facility] [place] for ongoing therapy and support. Contact details: Address: [address], [place], [place] [address] Phone: [phone] Walk-in Hours: Monday to Friday, 9 AM - 1 PM ontinue collaboration with the [place] for disability services and possible support for substance use and mental health treatment. Patient is to schedule outpatient echocardiogram and stress test if chest pain persists or symptoms increase. Myriam Forehand, NP 04/03/2023, 3:02 PM

## 2023-04-03 NOTE — Progress Notes (Signed)
   04/03/23 1100  Psych Admission Type (Psych Patients Only)  Admission Status Voluntary  Psychosocial Assessment  Patient Complaints Anxiety  Eye Contact Fair  Facial Expression Animated  Affect Appropriate to circumstance  Speech Pressured ('hiccup')  Interaction Assertive  Motor Activity Restless  Appearance/Hygiene Unremarkable  Behavior Characteristics Cooperative  Mood Anxious  Thought Process  Coherency WDL  Content WDL  Delusions None reported or observed  Perception WDL  Hallucination None reported or observed  Judgment WDL  Confusion None  Danger to Self  Current suicidal ideation? Denies  Agreement Not to Harm Self Yes  Description of Agreement verbal  Danger to Others  Danger to Others None reported or observed  Danger to Others Abnormal  Harmful Behavior to others No threats or harm toward other people  Destructive Behavior No threats or harm toward property

## 2023-04-03 NOTE — Plan of Care (Signed)
  Problem: Education: Goal: Knowledge of [facility] Education information/materials will improve Outcome: Progressing Goal: Emotional status will improve Outcome: Progressing Goal: Mental status will improve Outcome: Progressing Goal: Verbalization of understanding the information provided will improve Outcome: Progressing   Problem: Activity: Goal: Interest or engagement in activities will improve Outcome: Progressing Goal: Sleeping patterns will improve Outcome: Progressing   Problem: Coping: Goal: Ability to verbalize frustrations and anger appropriately will improve Outcome: Progressing Goal: Ability to demonstrate self-control will improve Outcome: Progressing   Problem: Health Behavior/Discharge Planning: Goal: Identification of resources available to assist in meeting health care needs will improve Outcome: Progressing Goal: Compliance with treatment plan for underlying cause of condition will improve Outcome: Progressing   Problem: Physical Regulation: Goal: Ability to maintain clinical measurements within normal limits will improve Outcome: Progressing   Problem: Safety: Goal: Periods of time without injury will increase Outcome: Progressing   Problem: Education: Goal: Knowledge of disease or condition will improve Outcome: Progressing Goal: Understanding of discharge needs will improve Outcome: Progressing   Problem: Physical Regulation: Goal: Complications related to the disease process, condition or treatment will be avoided or minimized Outcome: Progressing   Problem: Education: Goal: Ability to make informed decisions regarding treatment will improve Outcome: Progressing   Problem: Coping: Goal: Coping ability will improve Outcome: Progressing   Problem: Self-Concept: Goal: Ability to identify factors that promote anxiety will improve Outcome: Progressing Goal: Level of anxiety will decrease Outcome: Progressing Goal: Ability to modify  response to factors that promote anxiety will improve Outcome: Progressing

## 2023-04-03 NOTE — Progress Notes (Signed)
Patient ID: Raymond [name], male   DOB: [birth year], 42 y.o.   MRN: [id number]  The patient's discharge has been held due to lack of access to his home. He reported that he does not currently have his house key and will obtain it tomorrow, Saturday. Discharge will be reassessed once he has confirmed access to his home.  Continue Inpatient Care: Maintain current medication regimen and monitor for any changes in mood, seizure activity, or anxiety. Medication Compliance: Reinforce medication adherence and ensure the patient has adequate understanding and instructions for discharge medications. Social Worker Coordination: Social work to confirm the [employer] before finalizing discharge. Discharge medications and instructions already prepared

## 2023-04-03 NOTE — Group Note (Signed)
Recreation Therapy Group Note   Group Topic:Leisure Education  Group Date: 04/03/2023 Start Time: 1100 End Time: 1200 Facilitators: Rosina Lowenstein, LRT, CTRS Location:  Craft Room  Group Description: Leisure. Patients were given the option to choose from singing karaoke, coloring mandalas, using oil pastels, journaling, or playing with play-doh. LRT and pts discussed the meaning of leisure, the importance of participating in leisure during their free time/when they're outside of the hospital, as well as how our leisure interests can also serve as coping skills.    Goal Area(s) Addressed:  Patient will identify a current leisure interest.  Patient will learn the definition of "leisure". Patient will practice making a positive decision. Patient will have the opportunity to try a new leisure activity. Patient will communicate with peers and LRT.    Affect/Mood: Appropriate   Participation Level: Active and Engaged   Participation Quality: Independent   Behavior: Calm and Cooperative   Speech/Thought Process: Coherent   Insight: Good   Judgement: Good   Modes of Intervention: Activity   Patient Response to Interventions:  Attentive, Engaged, Interested , and Receptive   Education Outcome:  Acknowledges education   Clinical Observations/Individualized Feedback: Raymond [name] was active in their participation of session activities and group discussion. Pt identified "play sports and sing" as things he does in his free time. Pt chose to sing karaoke and play with play doh while in group. Pt interacted well with LRT and peers duration of session.    Plan: Continue to engage patient in RT group sessions 2-3x/week.   Rosina Lowenstein, LRT, CTRS 04/03/2023 1:04 PM

## 2023-04-04 NOTE — BH IP Treatment Plan (Signed)
Interdisciplinary Treatment and Diagnostic Plan Update  04/04/2023 Time of Session: 10:10 am Raymond [name] MRN: [id number]  Principal Diagnosis: Major depressive disorder, recurrent, severe without psychotic behavior (HCC)  Secondary Diagnoses: Principal Problem:   Major depressive disorder, recurrent, severe without psychotic behavior (HCC) Active Problems:   Alcohol abuse   Methamphetamine abuse (HCC)   Current Medications:  Current Facility-Administered Medications  Medication Dose Route Frequency Provider Last Rate Last Admin   albuterol (VENTOLIN HFA) 108 (90 Base) MCG/ACT inhaler 2 puff  2 puff Inhalation Q4H PRN Sarina Ill, DO   2 puff at 04/03/23 2122   alum & mag hydroxide-simeth (MAALOX/MYLANTA) 200-200-20 MG/5ML suspension 30 mL  30 mL Oral Q4H PRN Sarina Ill, DO       diphenhydrAMINE (BENADRYL) capsule 50 mg  50 mg Oral TID PRN Sarina Ill, DO       Or   diphenhydrAMINE (BENADRYL) injection 50 mg  50 mg Intramuscular TID PRN Sarina Ill, DO       feeding supplement (ENSURE ENLIVE / ENSURE PLUS) liquid 237 mL  1 Bottle Oral BID BM Myriam Forehand, NP   237 mL at 04/03/23 1425   FLUoxetine (PROZAC) capsule 40 mg  40 mg Oral Daily Sarina Ill, DO   40 mg at 04/04/23 0741   gabapentin (NEURONTIN) capsule 100 mg  100 mg Oral TID Charm Rings, NP   100 mg at 04/04/23 0741   haloperidol (HALDOL) tablet 5 mg  5 mg Oral TID PRN Sarina Ill, DO       Or   haloperidol lactate (HALDOL) injection 5 mg  5 mg Intramuscular TID PRN Sarina Ill, DO       hydrOXYzine (ATARAX) tablet 50 mg  50 mg Oral Q6H PRN Sarina Ill, DO   50 mg at 04/01/23 0800   levETIRAcetam (KEPPRA) tablet 750 mg  750 mg Oral Q12H Sarina Ill, DO   750 mg at 04/04/23 0741   LORazepam (ATIVAN) tablet 2 mg  2 mg Oral TID PRN Sarina Ill, DO       Or   LORazepam (ATIVAN) injection 2 mg  2 mg  Intramuscular TID PRN Sarina Ill, DO       magnesium hydroxide (MILK OF MAGNESIA) suspension 30 mL  30 mL Oral Daily PRN Sarina Ill, DO   30 mL at 04/03/23 0846   nicotine polacrilex (NICORETTE) gum 4 mg  4 mg Oral QID PRN Sarina Ill, DO   4 mg at 04/03/23 2012   ondansetron (ZOFRAN-ODT) disintegrating tablet 4 mg  4 mg Oral Q8H PRN Sarina Ill, DO       risperiDONE (RISPERDAL) tablet 2 mg  2 mg Oral BID Sarina Ill, DO   2 mg at 04/04/23 4098   thiamine (VITAMIN B1) tablet 100 mg  100 mg Oral Daily Sarina Ill, DO   100 mg at 04/04/23 1191   Or   thiamine (VITAMIN B1) injection 100 mg  100 mg Intravenous Daily Sarina Ill, DO       traZODone (DESYREL) tablet 100 mg  100 mg Oral QHS Sarina Ill, DO   100 mg at 04/03/23 2120   PTA Medications: Medications Prior to Admission  Medication Sig Dispense Refill Last Dose   albuterol (VENTOLIN HFA) 108 (90 Base) MCG/ACT inhaler Inhale 2 puffs into the lungs every 4 (four) hours as needed for wheezing or shortness of breath. 6.7  g 0    FLUoxetine (PROZAC) 40 MG capsule Take 1 capsule (40 mg total) by mouth daily. (Patient not taking: Reported on 02/04/2023) 30 capsule 0    hydrOXYzine (ATARAX) 50 MG tablet Take 1 tablet (50 mg total) by mouth every 6 (six) hours as needed for anxiety. 10 tablet 0    levETIRAcetam (KEPPRA) 750 MG tablet Take 1 tablet (750 mg total) by mouth every 12 (twelve) hours. 60 tablet 0    lisinopril (ZESTRIL) 10 MG tablet Take 1 tablet (10 mg total) by mouth daily. (Patient not taking: Reported on 02/04/2023) 10 tablet 0    nicotine polacrilex (NICORETTE) 4 MG gum Take 1 each (4 mg total) by mouth 4 (four) times daily as needed for smoking cessation. 100 tablet 0    prazosin (MINIPRESS) 1 MG capsule Take 1 capsule (1 mg total) by mouth at bedtime. (Patient not taking: Reported on 02/04/2023) 30 capsule 0    traZODone (DESYREL) 100 MG tablet  Take 1 tablet (100 mg total) by mouth at bedtime. 30 tablet 0    [DISCONTINUED] risperiDONE (RISPERDAL) 2 MG tablet Take 1 tablet (2 mg total) by mouth 2 (two) times daily. 60 tablet 0     Patient Stressors: Health problems   Medication change or noncompliance   Substance abuse    Patient Strengths: Careers information officer for treatment/growth   Treatment Modalities: Medication Management, Group therapy, Case management,  1 to 1 session with clinician, Psychoeducation, Recreational therapy.   Physician Treatment Plan for Primary Diagnosis: Major depressive disorder, recurrent, severe without psychotic behavior (HCC) Long Term Goal(s): Improvement in symptoms so as ready for discharge   Short Term Goals: Ability to identify changes in lifestyle to reduce recurrence of condition will improve Ability to verbalize feelings will improve Ability to disclose and discuss suicidal ideas Ability to demonstrate self-control will improve Ability to identify and develop effective coping behaviors will improve Ability to maintain clinical measurements within normal limits will improve Compliance with prescribed medications will improve Ability to identify triggers associated with substance abuse/mental health issues will improve  Medication Management: Evaluate patient's response, side effects, and tolerance of medication regimen.  Therapeutic Interventions: 1 to 1 sessions, Unit Group sessions and Medication administration.  Evaluation of Outcomes: Progressing  Physician Treatment Plan for Secondary Diagnosis: Principal Problem:   Major depressive disorder, recurrent, severe without psychotic behavior (HCC) Active Problems:   Alcohol abuse   Methamphetamine abuse (HCC)  Long Term Goal(s): Improvement in symptoms so as ready for discharge   Short Term Goals: Ability to identify changes in lifestyle to reduce recurrence of condition will improve Ability  to verbalize feelings will improve Ability to disclose and discuss suicidal ideas Ability to demonstrate self-control will improve Ability to identify and develop effective coping behaviors will improve Ability to maintain clinical measurements within normal limits will improve Compliance with prescribed medications will improve Ability to identify triggers associated with substance abuse/mental health issues will improve     Medication Management: Evaluate patient's response, side effects, and tolerance of medication regimen.  Therapeutic Interventions: 1 to 1 sessions, Unit Group sessions and Medication administration.  Evaluation of Outcomes: Progressing   RN Treatment Plan for Primary Diagnosis: Major depressive disorder, recurrent, severe without psychotic behavior (HCC) Long Term Goal(s): Knowledge of disease and therapeutic regimen to maintain health will improve  Short Term Goals: Ability to remain free from injury will improve, Ability to verbalize frustration and anger appropriately will improve, Ability  to demonstrate self-control, Ability to participate in decision making will improve, Ability to verbalize feelings will improve, Ability to disclose and discuss suicidal ideas, Ability to identify and develop effective coping behaviors will improve, and Compliance with prescribed medications will improve  Medication Management: RN will administer medications as ordered by provider, will assess and evaluate patient's response and provide education to patient for prescribed medication. RN will report any adverse and/or side effects to prescribing provider.  Therapeutic Interventions: 1 on 1 counseling sessions, Psychoeducation, Medication administration, Evaluate responses to treatment, Monitor vital signs and CBGs as ordered, Perform/monitor CIWA, COWS, AIMS and Fall Risk screenings as ordered, Perform wound care treatments as ordered.  Evaluation of Outcomes: Progressing   LCSW  Treatment Plan for Primary Diagnosis: Major depressive disorder, recurrent, severe without psychotic behavior (HCC) Long Term Goal(s): Safe transition to appropriate next level of care at discharge, Engage patient in therapeutic group addressing interpersonal concerns.  Short Term Goals: Engage patient in aftercare planning with referrals and resources, Increase social support, Increase ability to appropriately verbalize feelings, Increase emotional regulation, Facilitate acceptance of mental health diagnosis and concerns, and Increase skills for wellness and recovery  Therapeutic Interventions: Assess for all discharge needs, 1 to 1 time with Social worker, Explore available resources and support systems, Assess for adequacy in community support network, Educate family and significant other(s) on suicide prevention, Complete Psychosocial Assessment, Interpersonal group therapy.  Evaluation of Outcomes: Progressing   Progress in Treatment: Attending groups: Yes. Participating in groups: Yes. Taking medication as prescribed: Yes. Toleration medication: Yes. Family/Significant other contact made: Raymond [name], brother, [phone]  Patient understands diagnosis: Yes. Discussing patient identified problems/goals with staff: Yes. Medical problems stabilized or resolved: Yes. Denies suicidal/homicidal ideation: Yes. Issues/concerns per patient self-inventory: No. Other: None  New problem(s) identified: No, Describe:  none   New Short Term/Long Term Goal(s): detox, elimination of symptoms of psychosis, medication management for mood stabilization; elimination of SI thoughts; development of comprehensive mental wellness/sobriety plan. Update 04/04/23: No change at this time.   Patient Goals:  "get better"  Update 04/04/23: No change at this time.   Discharge Plan or Barriers: CSW to assist patient in development of appropriate discharge plans.  Patient reports that he can return to his home,  however, unclear at this time on his providers with the [facility] Update 04/04/23: The patient is ready for discharge, but stating that he can't leave because he has no key to his home.    Reason for Continuation of Hospitalization: Anxiety Depression Medication stabilization Suicidal ideation   Estimated Length of Stay:  1-7 days Update 04/04/23: Anticipated discharge date is 04/04/23  Last 3 [place] Suicide Severity Risk Score: Flowsheet Row Admission (Current) from 03/27/2023 in [facility] ED from 03/26/2023 in [facility] Emergency Department at [facility] ED from 02/03/2023 in [facility] Emergency Department at [facility]  C-SSRS RISK CATEGORY Error: Q7 should not be populated when Q6 is No High Risk Moderate Risk       Last PHQ 2/9 Scores:     No data to display          Scribe for Treatment Team: Marshell Levan, LCSW 04/04/2023 10:12 AM

## 2023-04-04 NOTE — Group Note (Signed)
Date:  04/04/2023 Time:  9:28 PM  Group Topic/Focus:  Wrap-Up Group:   The focus of this group is to help patients review their daily goal of treatment and discuss progress on daily workbooks.    Participation Level:  Active  Participation Quality:  Appropriate and Attentive  Affect:  Appropriate and Blunted  Cognitive:  Alert and Appropriate  Insight: Appropriate and Lacking  Engagement in Group:  Engaged  Modes of Intervention:  Discussion  Additional Comments:   Pt is always willing to help his fellow peers. Pt is active in the dayroom with peers.   Maglione,Verina Galeno E 04/04/2023, 9:28 PM

## 2023-04-04 NOTE — Progress Notes (Addendum)
Pt alert and oriented X4. Affect bright/mood  congruent. Pt denies anxiety/depression,  SI/HI/AVH as well as pain  at this time.  Scheduled medications administered to patient per MD orders. Support and encouragement provided .Routine safety checks conducted every 15 mins  without incident. Patients informed to notify staff with problems or concerns and  verbalize understanding.   R- No adverse drug reactions noted. Pt compliant with medications and treatment plan. Plan receptive, calm cooperative and interacts well with others on the unit. Pt contracts for safety and remains safe on the unit at this time.

## 2023-04-04 NOTE — Progress Notes (Signed)
[facility] MD Progress Note  04/04/2023 4:22 PM Raymond [name]  MRN:  [id number] Subjective:  "I am ready to go, I had lost my key, that's why I am still here..." Principal Problem: Major depressive disorder, recurrent, severe without psychotic behavior (HCC) Diagnosis: Principal Problem:   Major depressive disorder, recurrent, severe without psychotic behavior (HCC) Active Problems:   Alcohol abuse   Methamphetamine abuse (HCC)   Raymond [name] 42 year old Hispanic male admitted here secondary to suicidal ideation, substance use, seizure management concerns, and acute exacerbation of depression, anxiety, and PTSD symptoms. The patient was admitted with reported suicidal ideation, recent use of alcohol and methamphetamine, and concerns related to seizure management due to noncompliance with Keppra. He expressed depressive and anxiety symptoms, rated at 6/10, but denied recent suicidal ideation at the time of assessment. The patient also reported experiencing occasional nightmares and flashbacks linked to PTSD. He exhibited somatic symptoms, including reported chest pain and shortness of breath, suspected to be psychosomatic.  Assessment: patient is evaluated face-to-face by this provider and chart reviewed. Patient is a 42 year old male who is pleasant upon approach. He is active in the milieu, attending groups and other group activities. Patient is alert and oriented x 4. He does not appears to be preoccupied or responding to internal stimuli. He has good eye contact. His thought process is goal directed.  Patient denies SI/HI/AVH. He reports that he sleeps well and his appetite is good. He reports that he continues to experience grief ever since his mother passed away.  He also lost 2 babies via miscarriage, which led to a break up with his girlfriend.   Patient expresses readiness for discharge. He reports that he was waiting for his landlord to be available and open his apartment "because I lost my key and my  wallet".  Patient expresses his goals and states "I want to be serious on my medications". He reports that his dad is supportive.     Total Time spent with patient: 15 minutes  Past Psychiatric History: PTSD, Substance use, MDD, Anxiety  Past Medical History:  Past Medical History:  Diagnosis Date   Anxiety    Hepatitis C    PTSD (post-traumatic stress disorder)     Past Surgical History:  Procedure Laterality Date   FACIAL FRACTURE SURGERY     metal plate under right eye   Family History: History reviewed. No pertinent family history. Family Psychiatric  History: NA Social History:  Social History   Substance and Sexual Activity  Alcohol Use Yes   Comment: 1 liter bottle of vodka prior to arrival     Social History   Substance and Sexual Activity  Drug Use Yes   Types: Methamphetamines    Social History   Socioeconomic History   Marital status: Single    Spouse name: Not on file   Number of children: Not on file   Years of education: Not on file   Highest education level: Not on file  Occupational History   Not on file  Tobacco Use   Smoking status: Every Day    Current packs/day: 1.00    Types: Cigarettes    Passive exposure: Current   Smokeless tobacco: Never  Vaping Use   Vaping status: Never Used  Substance and Sexual Activity   Alcohol use: Yes    Comment: 1 liter bottle of vodka prior to arrival   Drug use: Yes    Types: Methamphetamines   Sexual activity: Not Currently  Other Topics  Concern   Not on file  Social History Narrative   Not on file   Social Determinants of Health   Financial Resource Strain: Not on File (08/18/2022)   Received from [employer], [place]   [employer]    Financial Resource Strain: 0  Food Insecurity: Food Insecurity Present (03/27/2023)   Hunger Vital Sign    Worried About Running Out of Food in the Last Year: Sometimes true    Ran Out of Food in the Last Year: Sometimes true  Transportation Needs: Unmet  Transportation Needs (03/27/2023)   PRAPARE - Administrator, Civil Service (Medical): Yes    Lack of Transportation (Non-Medical): Yes  Physical Activity: Not on File (08/18/2022)   Received from [place], [place]   Physical Activity    Physical Activity: 0  Stress: Not on File (08/18/2022)   Received from [employer], [place]   Stress    Stress: 0  Social Connections: Not on File (02/15/2023)   Received from [employer]   Social Connections    Connectedness: 0   Additional Social History:                         Sleep: Good  Appetite:  Good  Current Medications: Current Facility-Administered Medications  Medication Dose Route Frequency Provider Last Rate Last Admin   albuterol (VENTOLIN HFA) 108 (90 Base) MCG/ACT inhaler 2 puff  2 puff Inhalation Q4H PRN Sarina Ill, DO   2 puff at 04/03/23 2122   alum & mag hydroxide-simeth (MAALOX/MYLANTA) 200-200-20 MG/5ML suspension 30 mL  30 mL Oral Q4H PRN Sarina Ill, DO       diphenhydrAMINE (BENADRYL) capsule 50 mg  50 mg Oral TID PRN Sarina Ill, DO       Or   diphenhydrAMINE (BENADRYL) injection 50 mg  50 mg Intramuscular TID PRN Sarina Ill, DO       feeding supplement (ENSURE ENLIVE / ENSURE PLUS) liquid 237 mL  1 Bottle Oral BID BM Myriam Forehand, NP   237 mL at 04/04/23 1236   FLUoxetine (PROZAC) capsule 40 mg  40 mg Oral Daily Sarina Ill, DO   40 mg at 04/04/23 0741   gabapentin (NEURONTIN) capsule 100 mg  100 mg Oral TID Charm Rings, NP   100 mg at 04/04/23 1238   haloperidol (HALDOL) tablet 5 mg  5 mg Oral TID PRN Sarina Ill, DO       Or   haloperidol lactate (HALDOL) injection 5 mg  5 mg Intramuscular TID PRN Sarina Ill, DO       hydrOXYzine (ATARAX) tablet 50 mg  50 mg Oral Q6H PRN Sarina Ill, DO   50 mg at 04/01/23 0800   levETIRAcetam (KEPPRA) tablet 750 mg  750 mg Oral Q12H Sarina Ill, DO   750 mg at  04/04/23 0741   LORazepam (ATIVAN) tablet 2 mg  2 mg Oral TID PRN Sarina Ill, DO       Or   LORazepam (ATIVAN) injection 2 mg  2 mg Intramuscular TID PRN Sarina Ill, DO       magnesium hydroxide (MILK OF MAGNESIA) suspension 30 mL  30 mL Oral Daily PRN Sarina Ill, DO   30 mL at 04/03/23 0846   nicotine polacrilex (NICORETTE) gum 4 mg  4 mg Oral QID PRN Sarina Ill, DO   4 mg at 04/03/23 2012  ondansetron (ZOFRAN-ODT) disintegrating tablet 4 mg  4 mg Oral Q8H PRN Sarina Ill, DO       risperiDONE (RISPERDAL) tablet 2 mg  2 mg Oral BID Sarina Ill, DO   2 mg at 04/04/23 6578   thiamine (VITAMIN B1) tablet 100 mg  100 mg Oral Daily Sarina Ill, DO   100 mg at 04/04/23 4696   Or   thiamine (VITAMIN B1) injection 100 mg  100 mg Intravenous Daily Sarina Ill, DO       traZODone (DESYREL) tablet 100 mg  100 mg Oral QHS Sarina Ill, DO   100 mg at 04/03/23 2120    Lab Results: No results found for this or any previous visit (from the past 48 hour(s)).  Blood Alcohol level:  Lab Results  Component Value Date   ETH 292 (H) 03/26/2023   ETH <10 02/03/2023    Metabolic Disorder Labs: Lab Results  Component Value Date   HGBA1C 5.4 03/28/2023   MPG 108.28 03/28/2023   MPG 111.15 01/26/2023   Lab Results  Component Value Date   PROLACTIN 12.0 01/22/2023   PROLACTIN 19.5 11/20/2022   Lab Results  Component Value Date   CHOL 198 03/28/2023   TRIG 225 (H) 03/28/2023   HDL 69 03/28/2023   CHOLHDL 2.9 03/28/2023   VLDL 45 (H) 03/28/2023   LDLCALC 84 03/28/2023   LDLCALC 56 01/26/2023    Physical Findings: AIMS:  , ,  ,  ,    CIWA:  CIWA-Ar Total: 5 COWS:     Musculoskeletal: Strength & Muscle Tone: within normal limits Gait & Station: normal Patient leans: N/A  Psychiatric Specialty Exam:  Presentation  General Appearance:  Appropriate for Environment; Fairly  Groomed  Eye Contact: Good  Speech: Clear and Coherent; Normal Rate  Speech Volume: Normal  Handedness: Right   Mood and Affect  Mood: Euthymic  Affect: Appropriate; Congruent   Thought Process  Thought Processes: Coherent  Descriptions of Associations:Intact  Orientation:Full (Time, Place and Person) (and situation)  Thought Content:Logical; WDL  History of Schizophrenia/Schizoaffective disorder:No  Duration of Psychotic Symptoms:No data recorded Hallucinations:Hallucinations: None Description of Auditory Hallucinations: denies Description of Visual Hallucinations: denies  Ideas of Reference:None  Suicidal Thoughts:Suicidal Thoughts: No SI Active Intent and/or Plan: -- (none noted)  Homicidal Thoughts:No data recorded  Sensorium  Memory: Immediate Good; Remote Good  Judgment: Fair  Insight: Fair   Art therapist  Concentration: Good  Attention Span: Fair  Recall: Good  Fund of Knowledge: Good  Language: Good   Psychomotor Activity  Psychomotor Activity: Psychomotor Activity: Normal   Assets  Assets: Financial Resources/Insurance; Communication Skills; Housing   Sleep  Sleep: Sleep: Good Number of Hours of Sleep: 7    Physical Exam: Physical Exam Vitals and nursing note reviewed.  Constitutional:      Appearance: Normal appearance.  HENT:     Head: Normocephalic and atraumatic.     Right Ear: Tympanic membrane normal.     Left Ear: Tympanic membrane normal.     Nose: Nose normal.     Mouth/Throat:     Mouth: Mucous membranes are moist.  Eyes:     Conjunctiva/sclera: Conjunctivae normal.  Cardiovascular:     Rate and Rhythm: Normal rate.     Pulses: Normal pulses.  Pulmonary:     Effort: Pulmonary effort is normal.  Musculoskeletal:        General: Normal range of motion.     Cervical back:  Normal range of motion and neck supple.  Neurological:     General: No focal deficit present.     Mental  Status: He is alert and oriented to person, place, and time.  Psychiatric:        Thought Content: Thought content normal.    Review of Systems  Constitutional: Negative.   HENT: Negative.    Eyes: Negative.   Respiratory: Negative.    Cardiovascular: Negative.   Gastrointestinal: Negative.   Genitourinary: Negative.   Musculoskeletal: Negative.   Skin: Negative.   Neurological: Negative.   Endo/Heme/Allergies: Negative.   Psychiatric/Behavioral: Negative.     Blood pressure (!) 118/99, pulse 87, temperature 97.6 F (36.4 C), temperature source Oral, resp. rate 18, height 6\' 1"  (1.854 m), weight 74.4 kg, SpO2 97%. Body mass index is 21.64 kg/m.   Treatment Plan Summary: Daily contact with patient to assess and evaluate symptoms and progress in treatment and Medication management  Olin Pia, NP 04/04/2023, 4:22 PM

## 2023-04-04 NOTE — Plan of Care (Signed)
  Problem: Education: Goal: Knowledge of [facility] Education information/materials will improve Outcome: Progressing Goal: Emotional status will improve Outcome: Progressing Goal: Mental status will improve Outcome: Progressing Goal: Verbalization of understanding the information provided will improve Outcome: Progressing   Problem: Activity: Goal: Interest or engagement in activities will improve Outcome: Progressing Goal: Sleeping patterns will improve Outcome: Progressing   Problem: Coping: Goal: Ability to verbalize frustrations and anger appropriately will improve Outcome: Progressing Goal: Ability to demonstrate self-control will improve Outcome: Progressing   Problem: Health Behavior/Discharge Planning: Goal: Identification of resources available to assist in meeting health care needs will improve Outcome: Progressing Goal: Compliance with treatment plan for underlying cause of condition will improve Outcome: Progressing   Problem: Physical Regulation: Goal: Ability to maintain clinical measurements within normal limits will improve Outcome: Progressing   Problem: Safety: Goal: Periods of time without injury will increase Outcome: Progressing   Problem: Education: Goal: Knowledge of disease or condition will improve Outcome: Progressing Goal: Understanding of discharge needs will improve Outcome: Progressing   Problem: Physical Regulation: Goal: Complications related to the disease process, condition or treatment will be avoided or minimized Outcome: Progressing   Problem: Education: Goal: Ability to make informed decisions regarding treatment will improve Outcome: Progressing   Problem: Coping: Goal: Coping ability will improve Outcome: Progressing   Problem: Self-Concept: Goal: Ability to identify factors that promote anxiety will improve Outcome: Progressing Goal: Level of anxiety will decrease Outcome: Progressing Goal: Ability to modify  response to factors that promote anxiety will improve Outcome: Progressing

## 2023-04-04 NOTE — Progress Notes (Signed)
D- Patient alert and oriented x 4. Affect bright and anxious/mood congruent. Denies SI/ HI/ AVH. Patient denies pain. Patient endorses depression and anxiety. His goals today are to "pray and health". A- Scheduled medications administered to patient, per MD orders. Support and encouragement provided.  Routine safety checks conducted every 15 minutes without incident.  Patient informed to notify staff with problems or concerns and verbalizes understanding. R- No adverse drug reactions noted.  Patient compliant with medications and treatment plan. Patient receptive and cooperative. He interacts well with others on the unit.  Patient contracts for safety and  remains safe on the unit at this time.

## 2023-04-04 NOTE — Group Note (Signed)
Date:  04/04/2023 Time:  12:43 PM  Group Topic/Focus:  Goals Group:   The focus of this group is to help patients establish daily goals to achieve during treatment and discuss how the patient can incorporate goal setting into their daily lives to aide in recovery.    Participation Level:  Active  Participation Quality:  Appropriate  Affect:  Appropriate  Cognitive:  Appropriate  Insight: Appropriate  Engagement in Group:  Engaged  Modes of Intervention:  Activity  Additional Comments:    Kamla Skilton 04/04/2023, 12:43 PM

## 2023-04-05 DIAGNOSIS — F332 Major depressive disorder, recurrent severe without psychotic features: Secondary | ICD-10-CM | POA: Diagnosis not present

## 2023-04-05 NOTE — Group Note (Signed)
Date:  04/05/2023 Time:  10:16 AM  Group Topic/Focus:  Conflict Resolution:   The focus of this group is to discuss the conflict resolution process and how it may be used upon discharge. Goals Group:   The focus of this group is to help patients establish daily goals to achieve during treatment and discuss how the patient can incorporate goal setting into their daily lives to aide in recovery. Healthy Communication:   The focus of this group is to discuss communication, barriers to communication, as well as healthy ways to communicate with others.    Participation Level:  Active  Participation Quality:  Appropriate  Affect:  Appropriate  Cognitive:  Alert and Appropriate  Insight: Appropriate  Engagement in Group:  Developing/Improving  Modes of Intervention:  Activity  Additional Comments:    Nobuko Gsell 04/05/2023, 10:16 AM

## 2023-04-05 NOTE — Progress Notes (Signed)
D- Patient alert and oriented x 4. Affect bright/mood euthymic. Denies SI/ HI/ AVH. Patient denies pain. Patient endorses depression and anxiety. His goal today is to "pray and for health". He states he is ready for discharge home tomorrow. A- Scheduled medications administered to patient, per MD orders. Support and encouragement provided.  Routine safety checks conducted every 15 minutes without incident.  Patient informed to notify staff with problems or concerns and verbalizes understanding. R- No adverse drug reactions noted.  Patient compliant with medications and treatment plan. Patient receptive and cooperative and interacts well with others on the unit.  Patient contracts for safety and  remains safe on the unit at this time.

## 2023-04-05 NOTE — Group Note (Signed)
Date:  04/05/2023 Time:  4:19 PM  Group Topic/Focus:  OUTDOOR RECREATION STRUCTURED ACTIVITY    Participation Level:  Active  Participation Quality:  Appropriate  Affect:  Appropriate  Cognitive:  Appropriate  Insight: Appropriate  Engagement in Group:  Developing/Improving  Modes of Intervention:  Activity  Additional Comments:    July Linam 04/05/2023, 4:19 PM

## 2023-04-05 NOTE — Group Note (Signed)
[facility] LCSW Group Therapy Note   Group Date: 04/05/2023 Start Time: 1550 End Time: 1635   Type of Therapy/Topic:  Group Therapy:  Emotion Regulation  Participation Level:  Active   Mood:  Description of Group:    The purpose of this group is to assist patients in learning to regulate negative emotions and experience positive emotions. Patients will be guided to discuss ways in which they have been vulnerable to their negative emotions. These vulnerabilities will be juxtaposed with experiences of positive emotions or situations, and patients challenged to use positive emotions to combat negative ones. Special emphasis will be placed on coping with negative emotions in conflict situations, and patients will process healthy conflict resolution skills.  Therapeutic Goals: Patient will identify two positive emotions or experiences to reflect on in order to balance out negative emotions:  Patient will label two or more emotions that they find the most difficult to experience:  Patient will be able to demonstrate positive conflict resolution skills through discussion or role plays:   Summary of Patient Progress:  The patient attended group. The patient participated during the game of Emotional Regulation BINGO. The patient participated during the group discussion.     Therapeutic Modalities:   Cognitive Behavioral Therapy Feelings Identification Dialectical Behavioral Therapy   Marshell Levan, LCSW

## 2023-04-05 NOTE — Progress Notes (Addendum)
[facility] MD Progress Note  04/05/2023 10:23 AM Raymond [name]  MRN:  [id number]  Subjective:  Raymond [name] stated " I cannot discharge till tomorrow because my landlord is not home yet and I do not have keys to get in.'  Evaluation: Raymond [name]'s 42 year old Caucasian male was seen and evaluated face-to-face by this provider.  Patient presents anxious related to discharge plan.  He is denying suicidal or homicidal ideations.  Denies auditory visual hallucinations.  States he was triggered by the passing of his mother 3 years prior.  States his depression and anxiety appear to be getting worse.  States he was recently diagnosed with stage II lung cancer, tumor and has a history of Tourette's.  Reports all his medical history is in [place].  States he has been followed by ACT services in the community, invasions of life.  States he has been taking medications as indicated.  Adarius carries a diagnosis with major depressive disorder, generalized anxiety disorder, traumatic brain injury seizure disorder,  methamphetamine abuse and substance-induced mood disorder.  Currently he is prescribed Prozac 40 mg, Risperdal 2mg  bid gabapentin 100 mg 3 times daily Keppra 75 mg twice daily which he reports he has been taking and tolerating well.  Reports a good appetite.  States he is resting well throughout the night.  Anticipated discharge 04/06/2023.  Raymond [name] observed sitting in day room interacting with peers.  Noted to have intermittent hiccups/shortness of breath throughout this assessment.  He attributes sounds to this to his Tourette's syndrome. he is alert/oriented x 3; calm/cooperative; and mood congruent with affect.  Patient is speaking in a clear tone at moderate volume, and normal pace; with good eye contact. His thought process is coherent and relevant; There is no indication that he is currently responding to internal/external stimuli or experiencing delusional thought content.  Patient denies  suicidal/self-harm/homicidal ideation, psychosis, and paranoia.  Patient has remained calm throughout assessment and has answered questions appropriately.   Principal Problem: Major depressive disorder, recurrent, severe without psychotic behavior (HCC) Diagnosis: Principal Problem:   Major depressive disorder, recurrent, severe without psychotic behavior (HCC) Active Problems:   Alcohol abuse   Methamphetamine abuse (HCC)  Total Time spent with patient: 15 minutes  Past Psychiatric History:   Past Medical History:  Past Medical History:  Diagnosis Date   Anxiety    Hepatitis C    PTSD (post-traumatic stress disorder)     Past Surgical History:  Procedure Laterality Date   FACIAL FRACTURE SURGERY     metal plate under right eye   Family History: History reviewed. No pertinent family history. Family Psychiatric  History:  Social History:  Social History   Substance and Sexual Activity  Alcohol Use Yes   Comment: 1 liter bottle of vodka prior to arrival     Social History   Substance and Sexual Activity  Drug Use Yes   Types: Methamphetamines    Social History   Socioeconomic History   Marital status: Single    Spouse name: Not on file   Number of children: Not on file   Years of education: Not on file   Highest education level: Not on file  Occupational History   Not on file  Tobacco Use   Smoking status: Every Day    Current packs/day: 1.00    Types: Cigarettes    Passive exposure: Current   Smokeless tobacco: Never  Vaping Use   Vaping status: Never Used  Substance and Sexual Activity   Alcohol use: Yes  Comment: 1 liter bottle of vodka prior to arrival   Drug use: Yes    Types: Methamphetamines   Sexual activity: Not Currently  Other Topics Concern   Not on file  Social History Narrative   Not on file   Social Determinants of Health   Financial Resource Strain: Not on File (08/18/2022)   Received from [employer] [facility] [place] Strain     Financial Resource Strain: 0  Food Insecurity: Food Insecurity Present (03/27/2023)   Hunger Vital Sign    Worried About Running Out of Food in the Last Year: Sometimes true    Ran Out of Food in the Last Year: Sometimes true  Transportation Needs: Unmet Transportation Needs (03/27/2023)   PRAPARE - Administrator, Civil Service (Medical): Yes    Lack of Transportation (Non-Medical): Yes  Physical Activity: Not on File (08/18/2022)   Received from [place], [place]   Physical Activity    Physical Activity: 0  Stress: Not on File (08/18/2022)   Received from [facility], [place]   Stress    Stress: 0  Social Connections: Not on File (02/15/2023)   Received from [employer]   Social Connections    Connectedness: 0   Additional Social History:                         Sleep: Good  Appetite:  Fair  Current Medications: Current Facility-Administered Medications  Medication Dose Route Frequency Provider Last Rate Last Admin   albuterol (VENTOLIN HFA) 108 (90 Base) MCG/ACT inhaler 2 puff  2 puff Inhalation Q4H PRN Sarina Ill, DO   2 puff at 04/05/23 0811   alum & mag hydroxide-simeth (MAALOX/MYLANTA) 200-200-20 MG/5ML suspension 30 mL  30 mL Oral Q4H PRN Sarina Ill, DO       diphenhydrAMINE (BENADRYL) capsule 50 mg  50 mg Oral TID PRN Sarina Ill, DO       Or   diphenhydrAMINE (BENADRYL) injection 50 mg  50 mg Intramuscular TID PRN Sarina Ill, DO       feeding supplement (ENSURE ENLIVE / ENSURE PLUS) liquid 237 mL  1 Bottle Oral BID BM Myriam Forehand, NP   237 mL at 04/04/23 1236   FLUoxetine (PROZAC) capsule 40 mg  40 mg Oral Daily Sarina Ill, DO   40 mg at 04/05/23 0813   gabapentin (NEURONTIN) capsule 100 mg  100 mg Oral TID Charm Rings, NP   100 mg at 04/05/23 0813   haloperidol (HALDOL) tablet 5 mg  5 mg Oral TID PRN Sarina Ill, DO       Or   haloperidol lactate (HALDOL) injection 5 mg  5 mg  Intramuscular TID PRN Sarina Ill, DO       hydrOXYzine (ATARAX) tablet 50 mg  50 mg Oral Q6H PRN Sarina Ill, DO   50 mg at 04/01/23 0800   levETIRAcetam (KEPPRA) tablet 750 mg  750 mg Oral Q12H Sarina Ill, DO   750 mg at 04/05/23 8295   LORazepam (ATIVAN) tablet 2 mg  2 mg Oral TID PRN Sarina Ill, DO       Or   LORazepam (ATIVAN) injection 2 mg  2 mg Intramuscular TID PRN Sarina Ill, DO       magnesium hydroxide (MILK OF MAGNESIA) suspension 30 mL  30 mL Oral Daily PRN Sarina Ill, DO   30 mL at 04/03/23  6578   nicotine polacrilex (NICORETTE) gum 4 mg  4 mg Oral QID PRN Sarina Ill, DO   4 mg at 04/04/23 2025   ondansetron (ZOFRAN-ODT) disintegrating tablet 4 mg  4 mg Oral Q8H PRN Sarina Ill, DO       risperiDONE (RISPERDAL) tablet 2 mg  2 mg Oral BID Sarina Ill, DO   2 mg at 04/05/23 4696   thiamine (VITAMIN B1) tablet 100 mg  100 mg Oral Daily Sarina Ill, DO   100 mg at 04/05/23 2952   Or   thiamine (VITAMIN B1) injection 100 mg  100 mg Intravenous Daily Sarina Ill, DO       traZODone (DESYREL) tablet 100 mg  100 mg Oral QHS Sarina Ill, DO   100 mg at 04/04/23 2107    Lab Results: No results found for this or any previous visit (from the past 48 hour(s)).  Blood Alcohol level:  Lab Results  Component Value Date   ETH 292 (H) 03/26/2023   ETH <10 02/03/2023    Metabolic Disorder Labs: Lab Results  Component Value Date   HGBA1C 5.4 03/28/2023   MPG 108.28 03/28/2023   MPG 111.15 01/26/2023   Lab Results  Component Value Date   PROLACTIN 12.0 01/22/2023   PROLACTIN 19.5 11/20/2022   Lab Results  Component Value Date   CHOL 198 03/28/2023   TRIG 225 (H) 03/28/2023   HDL 69 03/28/2023   CHOLHDL 2.9 03/28/2023   VLDL 45 (H) 03/28/2023   LDLCALC 84 03/28/2023   LDLCALC 56 01/26/2023    Physical Findings: AIMS:  , ,  ,  ,     CIWA:  CIWA-Ar Total: 5 COWS:     Musculoskeletal: Strength & Muscle Tone: within normal limits Gait & Station: normal Patient leans: N/A  Psychiatric Specialty Exam:  Presentation  General Appearance:  Appropriate for Environment  Eye Contact: Good  Speech: Clear and Coherent  Speech Volume: Normal  Handedness: Right   Mood and Affect  Mood: Anxious  Affect: Congruent   Thought Process  Thought Processes: Coherent  Descriptions of Associations:Intact  Orientation:Full (Time, Place and Person)  Thought Content:Logical  History of Schizophrenia/Schizoaffective disorder:No  Duration of Psychotic Symptoms:No data recorded Hallucinations:Hallucinations: None  Ideas of Reference:None  Suicidal Thoughts:Suicidal Thoughts: No  Homicidal Thoughts:Homicidal Thoughts: No   Sensorium  Memory: Immediate Good; Recent Good; Remote Fair  Judgment: Fair  Insight: Fair   Art therapist  Concentration: Fair  Attention Span: Good  Recall: Good  Fund of Knowledge: Fair  Language: Good   Psychomotor Activity  Psychomotor Activity: Psychomotor Activity: Normal   Assets  Assets: Desire for Improvement   Sleep  Sleep: Sleep: Fair Number of Hours of Sleep: 7    Physical Exam: Physical Exam Vitals and nursing note reviewed.  Neurological:     Mental Status: He is oriented to person, place, and time.  Psychiatric:        Mood and Affect: Mood normal.        Behavior: Behavior normal.        Thought Content: Thought content normal.    Review of Systems  Psychiatric/Behavioral:  Negative for depression and suicidal ideas. The patient is nervous/anxious.   All other systems reviewed and are negative.  Blood pressure (!) 124/108, pulse 84, temperature 98.6 F (37 C), resp. rate 18, height 6\' 1"  (1.854 m), weight 74.4 kg, SpO2 93%. Body mass index is 21.64 kg/m.   Treatment  Plan Summary: Daily contact with patient to  assess and evaluate symptoms and progress in treatment and Medication management  Continue with current treatment plan on 04/05/2023 as listed below except were noted  Major depressive disorder: Generalized anxiety disorder Substance-induced mood disorder.   Seizure disorders  Currently he is prescribed Prozac 40 mg,  Continue Risperdal 2mg  twice daily Continue gabapentin 100 mg 3 times daily Continue Keppra 75 mg twice daily.  Keep all out appointments following up with ACT services CSW to continue working on discharge disposition Patient encouraged to participate within the therapeutic milieu  Oneta Rack, NP 04/05/2023, 10:23 AM

## 2023-04-05 NOTE — Group Note (Signed)
Date:  04/05/2023 Time:  9:15 PM  Group Topic/Focus:  Wrap-Up Group:   The focus of this group is to help patients review their daily goal of treatment and discuss progress on daily workbooks.    Participation Level:  Active  Participation Quality:  Appropriate and Attentive  Affect:  Appropriate  Cognitive:  Alert  Insight: Appropriate and Good  Engagement in Group:  Engaged  Modes of Intervention:  Discussion  Additional Comments:     [name],Raymond Wenberg E 04/05/2023, 9:15 PM

## 2023-04-05 NOTE — Plan of Care (Signed)
Patient took meds. Did not have any problems when assessed.

## 2023-04-05 NOTE — Plan of Care (Signed)
  Problem: Education: Goal: Knowledge of [facility] Education information/materials will improve Outcome: Progressing Goal: Emotional status will improve Outcome: Progressing Goal: Mental status will improve Outcome: Progressing Goal: Verbalization of understanding the information provided will improve Outcome: Progressing   Problem: Activity: Goal: Interest or engagement in activities will improve Outcome: Progressing Goal: Sleeping patterns will improve Outcome: Progressing   Problem: Coping: Goal: Ability to verbalize frustrations and anger appropriately will improve Outcome: Progressing Goal: Ability to demonstrate self-control will improve Outcome: Progressing   Problem: Health Behavior/Discharge Planning: Goal: Identification of resources available to assist in meeting health care needs will improve Outcome: Progressing Goal: Compliance with treatment plan for underlying cause of condition will improve Outcome: Progressing   Problem: Physical Regulation: Goal: Ability to maintain clinical measurements within normal limits will improve Outcome: Progressing   Problem: Safety: Goal: Periods of time without injury will increase Outcome: Progressing   Problem: Education: Goal: Knowledge of disease or condition will improve Outcome: Progressing Goal: Understanding of discharge needs will improve Outcome: Progressing   Problem: Physical Regulation: Goal: Complications related to the disease process, condition or treatment will be avoided or minimized Outcome: Progressing   Problem: Education: Goal: Ability to make informed decisions regarding treatment will improve Outcome: Progressing   Problem: Coping: Goal: Coping ability will improve Outcome: Progressing   Problem: Self-Concept: Goal: Ability to identify factors that promote anxiety will improve Outcome: Progressing Goal: Level of anxiety will decrease Outcome: Progressing Goal: Ability to modify  response to factors that promote anxiety will improve Outcome: Progressing

## 2023-04-06 DIAGNOSIS — F332 Major depressive disorder, recurrent severe without psychotic features: Secondary | ICD-10-CM | POA: Diagnosis not present

## 2023-04-06 NOTE — Group Note (Signed)
Recreation Therapy Group Note   Group Topic:Problem Solving  Group Date: 04/06/2023 Start Time: 1000 End Time: 1110 Facilitators: Clinton Gallant, CTRS Location:  Craft Room  Group Description: Life Boat. Patients were given the scenario that they are on a boat that is about to become shipwrecked, leaving them stranded on an [place]. They are asked to make a list of 15 different items that they want to take with them when they are stranded on the [place]. Patients are asked to rank their items from most important to least important, #1 being the most important and #15 being the least. Patients will work individually for the first round to come up with 15 items and then pair up with a peer(s) to condense their list and come up with one list of 15 items between the two of them. Patients or LRT will read aloud the 15 different items to the group after each round. LRT facilitated post-activity processing to discuss how this activity can be used in daily life post discharge.   Goal Area(s) Addressed:  Patient will identify priorities, wants and needs. Patient will communicate with LRT and peers. Patient will work collectively as a Administrator, Civil Service. Patient will work on Product manager.    Affect/Mood: Appropriate   Participation Level: Active and Engaged   Participation Quality: Independent   Behavior: Appropriate, Calm, and Cooperative   Speech/Thought Process: Coherent   Insight: Good   Judgement: Good   Modes of Intervention: Activity   Patient Response to Interventions:  Attentive, Engaged, Interested , and Receptive   Education Outcome:  Acknowledges education   Clinical Observations/Individualized Feedback: Raymond [name] was active in their participation of session activities and group discussion. Pt identified "tent, knife, wood, meds" as things he will bring with him on the [place]. Pt interacted well with LRT and peers duration of session.    Plan: Continue to engage patient  in RT group sessions 2-3x/week.   Rosina Lowenstein, LRT, CTRS 04/06/2023 11:59 AM

## 2023-04-06 NOTE — Progress Notes (Signed)
  [facility] Adult Case Management Discharge Plan :  Will you be returning to the same living situation after discharge:  Yes,  pt will return to home  At discharge, do you have transportation home?: Yes,  CSW will assist with transportation  Do you have the ability to pay for your medications: Yes,  BLUE CROSS BLUE SHIELD / BCBS OTHER  Release of information consent forms completed and in the chart;  Patient's signature needed at discharge.  Patient to Follow up at:  Follow-up Information     [facility] Follow up.   Specialty: Professional Counselor Why: Walk in hours are Monday through Friday 9AM to 1PM.  You will need to be restablished as a patient. Contact information: [facility] [address] [place] [address] [phone]                 Next level of care provider has access to [facility] Link:no  Safety Planning and Suicide Prevention discussed: Raymond [name]  Raymond [name] [phone]     Has patient been referred to the Quitline?: Patient refused referral for treatment  Patient has been referred for addiction treatment: Patient refused referral for treatment.  [address], LCSWA 04/06/2023, 11:29 AM

## 2023-04-06 NOTE — Progress Notes (Signed)
Patient ID: Raymond [name], male   DOB: 16-Jun-1980, 42 y.o.   MRN: [id number] Patient secured key to apartment and discharged with medication and followup Medication Compliance: Reinforce medication adherence and ensure the patient has adequate understanding and instructions for discharge medications. Social Worker Coordination: Social work to confirm the [employer] before finalizing discharge. Discharge medications and instructions already prepared

## 2023-04-06 NOTE — Progress Notes (Signed)
Patient pleasant and cooperative on approach. Denies SI,HI and AVH. Verbalized understanding discharge instructions,prescriptions and follow up care.  All belongings returned from BMU locker. Suicide safety plan filled by patient and placed in chart. Copy given to patient.Patient escorted out by staff and transported by cab. 

## 2023-04-06 NOTE — Progress Notes (Signed)
Pt is alert and oriented X4. Affect  bright/mood congruent.SI/HI/AVH at this time.  Scheduled medications administration to patient per MD orders. Support and encouragement provided. Routine safety checks conducted every 15 minutes without incident. Patient informed to notify staff with problems or concerns and verbalize understanding.   No adverse drug reactions noted. Compliant with medications and treatment plan . Pt receptive , calm , cooperative and interacts well with others on the unit. Pt contracts for safety  and remains safe on the unit at this time. Marland Kitchen

## 2023-04-10 ENCOUNTER — Emergency Department
Admission: EM | Admit: 2023-04-10 | Discharge: 2023-04-11 | Disposition: A | Discharge status: Other Acute Inpatient Hospital | Payer: BLUE CROSS/BLUE SHIELD | Attending: Emergency Medicine | Admitting: Emergency Medicine

## 2023-04-10 DIAGNOSIS — F3340 Major depressive disorder, recurrent, in remission, unspecified: Secondary | ICD-10-CM | POA: Diagnosis not present

## 2023-04-10 DIAGNOSIS — F419 Anxiety disorder, unspecified: Secondary | ICD-10-CM | POA: Insufficient documentation

## 2023-04-10 DIAGNOSIS — R110 Nausea: Secondary | ICD-10-CM | POA: Insufficient documentation

## 2023-04-10 DIAGNOSIS — F17210 Nicotine dependence, cigarettes, uncomplicated: Secondary | ICD-10-CM | POA: Insufficient documentation

## 2023-04-10 DIAGNOSIS — Z5900 Homelessness unspecified: Secondary | ICD-10-CM | POA: Insufficient documentation

## 2023-04-10 DIAGNOSIS — R45851 Suicidal ideations: Secondary | ICD-10-CM | POA: Insufficient documentation

## 2023-04-10 DIAGNOSIS — F1210 Cannabis abuse, uncomplicated: Secondary | ICD-10-CM | POA: Insufficient documentation

## 2023-04-10 DIAGNOSIS — F329 Major depressive disorder, single episode, unspecified: Secondary | ICD-10-CM | POA: Diagnosis present

## 2023-04-10 DIAGNOSIS — Z91010 Allergy to peanuts: Secondary | ICD-10-CM | POA: Diagnosis not present

## 2023-04-10 DIAGNOSIS — R109 Unspecified abdominal pain: Secondary | ICD-10-CM | POA: Insufficient documentation

## 2023-04-10 DIAGNOSIS — Y900 Blood alcohol level of less than 20 mg/100 ml: Secondary | ICD-10-CM | POA: Insufficient documentation

## 2023-04-10 LAB — ETHANOL: Alcohol, Ethyl (B): <10 mg/dL (ref <10)

## 2023-04-10 LAB — CBC
HCT: 47.7 % (ref 39.0–52.0)
Hemoglobin: 16.7 g/dL (ref 13.0–17.0)
MCH: 31.6 pg (ref 26.0–34.0)
MCHC: 35 g/dL (ref 30.0–36.0)
MCV: 90.2 fL (ref 80.0–100.0)
Platelets: 298 10*3/uL (ref 150–400)
RBC: 5.29 MIL/uL (ref 4.22–5.81)
RDW: 13.3 % (ref 11.5–15.5)
WBC: 7.9 10*3/uL (ref 4.0–10.5)
nRBC: 0 % (ref 0.0–0.2)

## 2023-04-10 LAB — URINE DRUG SCREEN, QUALITATIVE (ARMC ONLY)
Amphetamines, Ur Screen: NOT DETECTED
Barbiturates, Ur Screen: NOT DETECTED
Benzodiazepine, Ur Scrn: NOT DETECTED
Cannabinoid 50 Ng, Ur ~~LOC~~: POSITIVE — AB
Cocaine Metabolite,Ur ~~LOC~~: NOT DETECTED
MDMA (Ecstasy)Ur Screen: NOT DETECTED
Methadone Scn, Ur: NOT DETECTED
Opiate, Ur Screen: NOT DETECTED
Phencyclidine (PCP) Ur S: NOT DETECTED
Tricyclic, Ur Screen: NOT DETECTED

## 2023-04-10 LAB — COMPREHENSIVE METABOLIC PANEL
ALT: 81 U/L — ABNORMAL HIGH (ref 0–44)
AST: 37 U/L (ref 15–41)
Albumin: 4.8 g/dL (ref 3.5–5.0)
Alkaline Phosphatase: 117 U/L (ref 38–126)
Anion gap: 14 (ref 5–15)
BUN: 12 mg/dL (ref 6–20)
CO2: 18 mmol/L — ABNORMAL LOW (ref 22–32)
Calcium: 8.9 mg/dL (ref 8.9–10.3)
Chloride: 102 mmol/L (ref 98–111)
Creatinine, Ser: 0.63 mg/dL (ref 0.61–1.24)
GFR, Estimated: >60 mL/min (ref >60)
Glucose, Bld: 93 mg/dL (ref 70–99)
Potassium: 3.6 mmol/L (ref 3.5–5.1)
Sodium: 134 mmol/L — ABNORMAL LOW (ref 135–145)
Total Bilirubin: 0.5 mg/dL (ref <1.2)
Total Protein: 8.3 g/dL — ABNORMAL HIGH (ref 6.5–8.1)

## 2023-04-10 LAB — SALICYLATE LEVEL: Salicylate Lvl: <7 mg/dL — ABNORMAL LOW (ref 7.0–30.0)

## 2023-04-10 LAB — ACETAMINOPHEN LEVEL: Acetaminophen (Tylenol), Serum: <10 ug/mL — ABNORMAL LOW (ref 10–30)

## 2023-04-10 LAB — LIPASE, BLOOD: Lipase: 24 U/L (ref 11–51)

## 2023-04-10 MED ORDER — THIAMINE MONONITRATE 100 MG PO TABS
100.0000 mg | ORAL_TABLET | Freq: Every day | ORAL | Status: DC
  Administered 2023-04-10 – 2023-04-11 (×2): 100 mg via ORAL
  Filled 2023-04-10 (×2): qty 1

## 2023-04-10 MED ORDER — LORAZEPAM 1 MG PO TABS
1.5000 mg | ORAL_TABLET | Freq: Once | ORAL | Status: AC
  Administered 2023-04-10: 1.5 mg via ORAL
  Filled 2023-04-10: qty 1

## 2023-04-10 MED ORDER — LEVETIRACETAM 500 MG PO TABS
500.0000 mg | ORAL_TABLET | Freq: Two times a day (BID) | ORAL | Status: DC
  Administered 2023-04-10 – 2023-04-11 (×3): 500 mg via ORAL
  Filled 2023-04-10 (×3): qty 1

## 2023-04-10 MED ORDER — LORAZEPAM 1 MG PO TABS: 1.0000 mg | ORAL_TABLET | ORAL | Status: DC | PRN

## 2023-04-10 MED ORDER — THIAMINE HCL 100 MG/ML IJ SOLN
100.0000 mg | Freq: Every day | INTRAMUSCULAR | Status: DC
  Filled 2023-04-10: qty 1

## 2023-04-10 MED ORDER — ADULT MULTIVITAMIN W/MINERALS CH
1.0000 | ORAL_TABLET | Freq: Every day | ORAL | Status: DC
  Administered 2023-04-10 – 2023-04-11 (×2): 1 via ORAL
  Filled 2023-04-10 (×2): qty 1

## 2023-04-10 MED ORDER — FOLIC ACID 1 MG PO TABS
1.0000 mg | ORAL_TABLET | Freq: Every day | ORAL | Status: DC
  Administered 2023-04-10 – 2023-04-11 (×2): 1 mg via ORAL
  Filled 2023-04-10 (×2): qty 1

## 2023-04-10 NOTE — ED Provider Notes (Signed)
[facility] Provider Note    Event Date/Time   First MD Initiated Contact with Patient 04/10/23 [phone]     (approximate)   History   Suicidal  HPI  Raymond [name] is a 42 y.o. male reports he is suicidal.  Reports 3 days ago he ingested multiple of his medications in attempt to harm himself at the homeless shelter.  He decided to come in today as he would like to be admitted for psychiatric evaluation.  He relates lots of stress in his life very anxious.  He reports he did use alcohol yesterday but typically does not drink.  He does admit to seizure disorder but has not had any recent seizures.  He has a remote history of substance abuse and continues to use marijuana but denies use of other illicit substances  He advises he had abdominal pain some nausea, after taking some of his medications in an overdose attempt 3 days ago but that is resolved.  He now feels hungry would like something to eat.  No headache no chest pain no shortness of breath     Physical Exam   Triage Vital Signs: ED Triage Vitals [04/10/23 0957]  Encounter Vitals Group     BP (!) 121/95     Systolic BP Percentile      Diastolic BP Percentile      Pulse Rate 82     Resp      Temp 98.5 F (36.9 C)     Temp Source Oral     SpO2 98 %     Weight      Height      Head Circumference      Peak Flow      Pain Score 0     Pain Loc      Pain Education      Exclude from Growth Chart     Most recent vital signs: Vitals:   04/10/23 0957  BP: (!) 121/95  Pulse: 82  Temp: 98.5 F (36.9 C)  SpO2: 98%     General: Awake, no distress.  He does appear anxious.  He reports feeling extremely anxious. CV:  Good peripheral perfusion.  Normal tones and rate Resp:  Normal effort.  Clear bilateral Abd:  No distention.  Soft nontender nondistended at this time.  Patient reports he would like something to eat Other:  Sits up at bedside without difficulty.  Moves extremities well.  He does  appear anxious his speech slightly pressured.  Reports suicidal ideation   ED Results / Procedures / Treatments   Labs (all labs ordered are listed, but only abnormal results are displayed) Labs Reviewed  COMPREHENSIVE METABOLIC PANEL - Abnormal; Notable for the following components:      Result Value   Sodium 134 (*)    CO2 18 (*)    Total Protein 8.3 (*)    ALT 81 (*)    All other components within normal limits  URINE DRUG SCREEN, QUALITATIVE ([facility] ONLY) - Abnormal; Notable for the following components:   Cannabinoid 50 Ng, Ur [place] POSITIVE (*)    All other components within normal limits  ACETAMINOPHEN LEVEL - Abnormal; Notable for the following components:   Acetaminophen (Tylenol), Serum <10 (*)    All other components within normal limits  SALICYLATE LEVEL - Abnormal; Notable for the following components:   Salicylate Lvl <7.0 (*)    All other components within normal limits  CBC  LIPASE, BLOOD  ACETAMINOPHEN LEVEL  SALICYLATE LEVEL  ETHANOL  ETHANOL  LEVETIRACETAM LEVEL     EKG  And interpreted by me at 1010 heart rate 70 QRS 100 QTc 430 Normal sinus rhythm no evidence of acute ischemia. QT is normal, no widening of the QRS   RADIOLOGY     PROCEDURES:  Critical Care performed: No  Procedures   MEDICATIONS ORDERED IN ED: Medications  levETIRAcetam (KEPPRA) tablet 500 mg (500 mg Oral Given 04/10/23 1114)  LORazepam (ATIVAN) tablet 1-4 mg (has no administration in time range)  thiamine (VITAMIN B1) tablet 100 mg (has no administration in time range)    Or  thiamine (VITAMIN B1) injection 100 mg (has no administration in time range)  folic acid (FOLVITE) tablet 1 mg (has no administration in time range)  multivitamin with minerals tablet 1 tablet (has no administration in time range)  LORazepam (ATIVAN) tablet 1.5 mg (1.5 mg Oral Given 04/10/23 1016)     IMPRESSION / MDM / ASSESSMENT AND PLAN / ED COURSE  I reviewed the triage vital signs and the  nursing notes.                              Differential diagnosis includes, but is not limited to, suicidal ideation, reported suicide attempt by overdose.  Reviewed his current medications that he presents with he does not have any acetaminophen containing compounds.  He is fully awake alert feels anxious he does not show any evidence of sedation or hemodynamic instability reports the overdose occurred 3 days ago.  At this juncture I suspect there is no evidence of ongoing severe concern.  He does have a very slight transaminitis but historically has demonstrated slight abnormalities intermittently with regard to AST and ALT.  He reports the abdominal pain he has is resolved and he is now eating and drinking.  Lipase, acetaminophen, salicylate are normal  Patient's presentation is most consistent with acute complicated illness / injury requiring diagnostic workup.  Patient placed under involuntary commitment  ----------------------------------------- 12:42 PM on 04/10/2023 ----------------------------------------- Patient resting comfortably without acute distress.  Medically cleared for further psychiatric assessment.  Psychiatry advising current plan is for psychiatric admission   The patient has been placed in psychiatric observation due to the need to provide a safe environment for the patient while obtaining psychiatric consultation and evaluation, as well as ongoing medical and medication management to treat the patient's condition.  The patient has been placed under full IVC at this time.  Will place patient under CIWA protocol monitoring, he denies ongoing heavy alcohol abuse but does have a history of alcohol abuse in the past.    FINAL CLINICAL IMPRESSION(S) / ED DIAGNOSES   Final diagnoses:  Major depressive disorder, remission status unspecified, unspecified whether recurrent     Rx / DC Orders   ED Discharge Orders     None        Note:  This document was  prepared using Dragon voice recognition software and may include unintentional dictation errors.   Sharyn Creamer, MD 04/10/23 1247

## 2023-04-10 NOTE — ED Notes (Signed)
Patient is compliant, cooperative, no behavioral issues noted.

## 2023-04-10 NOTE — ED Notes (Signed)
Patient calm and cooperative, has been resting with eyes closed, Np and TTS woke Him to evaluate Him , He talked to them calmly.

## 2023-04-10 NOTE — ED Notes (Signed)
Patient transferred to [facility], report called. Patient is calm and cooperative.

## 2023-04-10 NOTE — ED Notes (Signed)
Patient items included,medications: fluoxetine, trazodone,risperidone,levETiracetam, lisinopril , hydroxyzine, cholorpromazine, inhaler( wixela) and albuterol inhaler. Patient had two shirts and jeans, shoes. Nurse will send medications to pharmacy.

## 2023-04-10 NOTE — BH Assessment (Signed)
Comprehensive Clinical Assessment (CCA) Note  04/10/2023 Raymond [name] [id number]  Chief Complaint: No chief complaint on file.  Visit Diagnosis: Major Depression Disorder   Erling Sherrard is a 42 year old male who presents to the ER due to having thoughts of ending his life. Patient states he has the plan of overdosing on his medications. Patient also reports of the use of cannabis and the last use was several days ago. During the interview, the patient was calm, cooperative and pleasant. He denies HI and AV/H.   CCA Screening, Triage and Referral (STR)  Patient Reported Information How did you hear about [place]? Self  What Is the Reason for Your Visit/Call Today? Patient having thoughts of ending his life by overdosing.  How Long Has This Been Causing You Problems? <Week  What Do You Feel Would Help You the Most Today? Treatment for Depression or other mood problem; Alcohol or Drug Use Treatment  Have You Recently Had Any Thoughts About Hurting Yourself? Yes  Are You Planning to Commit Suicide/Harm Yourself At This time? Yes   Flowsheet Row ED from 04/10/2023 in [facility] Emergency Department at [facility] Admission (Discharged) from 03/27/2023 in [facility] from 03/26/2023 in [facility] Emergency Department at [facility]  C-SSRS RISK CATEGORY High Risk Error: Q7 should not be populated when Q6 is No High Risk       Have you Recently Had Thoughts About Hurting Someone Karolee Ohs? No  Are You Planning to Harm Someone at This Time? No  Explanation: n/a   Have You Used Any Alcohol or Drugs in the Past 24 Hours? Yes  What Did You Use and How Much? Cannabis   Do You Currently Have a Therapist/Psychiatrist? No  Name of Therapist/Psychiatrist:    Have You Been Recently Discharged From Any Office Practice or Programs? No  Explanation of Discharge From Practice/Program: From 10/28/2022 to 11/08/2022-admitted to [facility]     CCA Screening Triage  Referral Assessment Type of Contact: Tele-Assessment  Telemedicine Service Delivery:   Is this Initial or Reassessment?   Date Telepsych consult ordered in [facility]:    Time Telepsych consult ordered in [facility]:    Location of Assessment: [facility] ED  Provider Location: [facility] ED   Collateral Involvement: none reported   Does Patient Have a Court Appointed Legal Guardian? No  Legal Guardian Contact Information: No legal guardian  Copy of Legal Guardianship Form: No - copy requested  Legal Guardian Notified of Arrival: -- (Patient denies that he has a legal guardian at this time. He is his own guardian.)  Legal Guardian Notified of Pending Discharge: -- (Patient denies that he has a legal guardian. He is his own guardian.)  If Minor and Not Living with Parent(s), Who has Custody? n/a  Is CPS involved or ever been involved? Never  Is APS involved or ever been involved? Never   Patient Determined To Be At Risk for Harm To Self or Others Based on Review of Patient Reported Information or Presenting Complaint? Yes, for Self-Harm  Method: Plan with intent and identified person  Availability of Means: In hand or used  Intent: Clearly intends on inflicting harm that could cause death  Notification Required: No need or identified person  Additional Information for Danger to Others Potential: -- (n/a)  Additional Comments for Danger to Others Potential: n/a  Are There Guns or Other Weapons in Your Home? No  Types of Guns/Weapons: n/a  Are These Weapons Safely Secured?  No  Who Could Verify You Are Able To Have These Secured: n/a  Do You Have any Outstanding Charges, Pending Court Dates, Parole/Probation? none reported  Contacted To Inform of Risk of Harm To Self or Others: Other: Comment (n/a)  Does Patient Present under Involuntary Commitment? Yes  [place] of Residence: [place]  Patient Currently Receiving the Following Services: Not Receiving  Services  Determination of Need: Emergent (2 hours)   Options For Referral: ED Visit; Inpatient Hospitalization   CCA Biopsychosocial Patient Reported Schizophrenia/Schizoaffective Diagnosis in Past: No   Strengths: Have some insight, seeking help and pleasant.   Mental Health Symptoms Depression:   Hopelessness; Change in energy/activity; Tearfulness   Duration of Depressive symptoms:  Duration of Depressive Symptoms: Greater than two weeks   Mania:   N/A   Anxiety:    N/A   Psychosis:   None   Duration of Psychotic symptoms:    Trauma:   N/A   Obsessions:   N/A   Compulsions:   N/A   Inattention:   N/A   Hyperactivity/Impulsivity:   N/A   Oppositional/Defiant Behaviors:   N/A   Emotional Irregularity:   N/A   Other Mood/Personality Symptoms:   None noted    Mental Status Exam Appearance and self-care  Stature:   Average   Weight:   Average weight   Clothing:   -- (Scrubs)   Grooming:   Normal   Cosmetic use:   None   Posture/gait:   Normal   Motor activity:   -- (Within normal range)   Sensorium  Attention:   Normal   Concentration:   Normal   Orientation:   X5   Recall/memory:   Normal   Affect and Mood  Affect:   Appropriate; Depressed; Full Range   Mood:   Depressed; Anxious   Relating  Eye contact:   Normal   Facial expression:   Depressed; Anxious; Responsive   Attitude toward examiner:   Cooperative   Thought and Language  Speech flow:  Clear and Coherent   Thought content:   Appropriate to Mood and Circumstances   Preoccupation:   None   Hallucinations:   None   Organization:   Coherent; Intact   [employer] of Knowledge:   Fair   Intelligence:   Average   Abstraction:   Normal; Functional   Judgement:   Fair   Programmer, systems   Insight:   Fair   Decision Making:   Impulsive   Social Functioning  Social Maturity:   Isolates    Social Judgement:   Normal; "Street Smart"   Stress  Stressors:   Transitions   Coping Ability:   Exhausted; Overwhelmed   Skill Deficits:   None   Supports:   Family     Religion: Religion/Spirituality Are You A Religious Person?: No  Leisure/Recreation: Leisure / Recreation Do You Have Hobbies?: No  Exercise/Diet: Exercise/Diet Do You Exercise?: No Have You Gained or Lost A Significant Amount of Weight in the Past Six Months?: No Do You Follow a Special Diet?: No Do You Have Any Trouble Sleeping?: Yes   CCA Employment/Education Employment/Work Situation: Employment / Work Situation Employment Situation: Unemployed Patient's Job has Been Impacted by Current Illness: No Has Patient ever Been in Equities trader?: No Did You Receive Any Psychiatric Treatment/Services While in the [employer]?: No  Education: Education Is Patient Currently Attending School?: No Did You Have An Individualized Education Program (IIEP): No Did You Have  Any Difficulty At School?: No Patient's Education Has Been Impacted by Current Illness: No   CCA Family/Childhood History Family and Relationship History: Family history Marital status: Single Does patient have children?: No  Childhood History:  Childhood History By whom was/is the patient raised?: Mother Did patient suffer any verbal/emotional/physical/sexual abuse as a child?: No Did patient suffer from severe childhood neglect?: No Has patient ever been sexually abused/assaulted/raped as an adolescent or adult?: No Was the patient ever a victim of a crime or a disaster?: No Witnessed domestic violence?: No Has patient been affected by domestic violence as an adult?: No  CCA Substance Use Alcohol/Drug Use: Alcohol / Drug Use Pain Medications: See MAR Prescriptions: See MAR Over the Counter: See MAR History of alcohol / drug use?: Yes Longest period of sobriety (when/how long): Unable to quantify Substance #1 Name of  Substance 1: Cannabis    ASAM's:  Six Dimensions of Multidimensional Assessment  Dimension 1:  Acute Intoxication and/or Withdrawal Potential:      Dimension 2:  Biomedical Conditions and Complications:      Dimension 3:  Emotional, Behavioral, or Cognitive Conditions and Complications:     Dimension 4:  Readiness to Change:     Dimension 5:  Relapse, Continued use, or Continued Problem Potential:     Dimension 6:  Recovery/Living Environment:     ASAM Severity Score:    ASAM Recommended Level of Treatment:     Substance use Disorder (SUD)    Recommendations for Services/Supports/Treatments:    Discharge Disposition:    DSM5 Diagnoses: Patient Active Problem List   Diagnosis Date Noted   Methamphetamine abuse (HCC) 03/28/2023   Suicidal ideation 02/04/2023   Major depressive disorder, recurrent, severe without psychotic behavior (HCC) 10/27/2022   Suicide attempt (HCC) 10/27/2022   Traumatic brain injury (HCC) 09/22/2022   Seizure disorder (HCC) 09/22/2022   Cocaine abuse (HCC) 09/22/2022   Alcohol abuse 09/22/2022    Referrals to Alternative Service(s): Referred to Alternative Service(s):   Place:   Date:   Time:    Referred to Alternative Service(s):   Place:   Date:   Time:    Referred to Alternative Service(s):   Place:   Date:   Time:    Referred to Alternative Service(s):   Place:   Date:   Time:     Lilyan Gilford MS, LCAS, [facility], [facility] Therapeutic Triage Specialist 04/10/2023 3:24 PM\

## 2023-04-10 NOTE — ED Notes (Signed)
Patient in thru EMS, reported to nurse that Patient took overdose of several medications 3 days ago, states that He didn't tell anyone until today, and He is having abdominal pain, and He has been living at the homeless shelter. Nurse spoke with Patient and He is alert and oriented, He keeps saying " I want to go to the other unit where I can order my on food. Patient is without signs of distress at this time.

## 2023-04-10 NOTE — ED Notes (Signed)
[facility]  CONSULT  PENDING  PLACEMENT  MOVED TO  [facility]

## 2023-04-10 NOTE — ED Notes (Signed)
Hospital meal provided.  100% consumed, pt tolerated w/o complaints.  Waste discarded appropriately.   

## 2023-04-10 NOTE — BH Assessment (Signed)
Per [facility] M.), patient to be referred out of system.  Referral information for Psychiatric Hospitalization faxed to;   [facility] [phone]- [phone]), no appropriate beds.  [facility] [phone]),   [address] [phone]),  [place] [phone], [phone], [phone] or [phone]),   [place] [phone]--- [phone]--- [phone])  [phone]),   [facility] [phone] -or- [phone]),   [place] [phone] phone-- [phone])  [facility] [phone]),  [place] [phone])  [facility] [phone]).  [facility] [phone]).

## 2023-04-10 NOTE — Consult Note (Signed)
[facility] Face-to-Face Psychiatry Consult   Reason for Consult: Psychiatric evaluation Referring Physician:  Sharyn Creamer, MD  Patient Identification: Raymond [name] MRN:  [id number] Principal Diagnosis: <principal problem not specified> Diagnosis:  Active Problems:   Major depressive disorder   Total Time spent with patient: 30 minutes  Subjective:   Raymond [name] is a 42 y.o. male patient admitted with suicidal ideations.  "I took 90 pills.  I want to go upstairs ".  HPI:  Raymond [name] is a 42 y.o. male patient admitted with suicidal ideations.  Patient has a psychiatric history of suicidal ideation, cocaine abuse, alcohol abuse, major depressive disorder, suicide attempt, PTSD, and methamphetamine abuse.  He reports missing his mother who has been deceased for the past 3 years.  Patient states that it is near the anniversary of his mother's death.  "I miss my momma".  Patient reports experiencing auditory hallucinations stating I hear my mother's voice.  He denies homicidal ideations.  Past Psychiatric History: See above  Risk to Self:  Yes Risk to Others:  Denies Prior Inpatient Therapy:  Yes Prior Outpatient Therapy:  Yes  Past Medical History:  Past Medical History:  Diagnosis Date   Anxiety    Hepatitis C    PTSD (post-traumatic stress disorder)     Past Surgical History:  Procedure Laterality Date   FACIAL FRACTURE SURGERY     metal plate under right eye   Family History: No family history on file. Family Psychiatric  History: None reported Social History:  Social History   Substance and Sexual Activity  Alcohol Use Yes   Comment: 1 liter bottle of vodka prior to arrival     Social History   Substance and Sexual Activity  Drug Use Yes   Types: Methamphetamines    Social History   Socioeconomic History   Marital status: Single    Spouse name: Not on file   Number of children: Not on file   Years of education: Not on file   Highest education level: Not on file   Occupational History   Not on file  Tobacco Use   Smoking status: Every Day    Current packs/day: 1.00    Types: Cigarettes    Passive exposure: Current   Smokeless tobacco: Never  Vaping Use   Vaping status: Never Used  Substance and Sexual Activity   Alcohol use: Yes    Comment: 1 liter bottle of vodka prior to arrival   Drug use: Yes    Types: Methamphetamines   Sexual activity: Not Currently  Other Topics Concern   Not on file  Social History Narrative   Not on file   Social Determinants of Health   Financial Resource Strain: Not on File (08/18/2022)   Received from [facility] [employer]    Financial Resource Strain: 0  Food Insecurity: Food Insecurity Present (03/27/2023)   Hunger Vital Sign    Worried About Running Out of Food in the Last Year: Sometimes true    Ran Out of Food in the Last Year: Sometimes true  Transportation Needs: Unmet Transportation Needs (03/27/2023)   PRAPARE - Administrator, Civil Service (Medical): Yes    Lack of Transportation (Non-Medical): Yes  Physical Activity: Not on File (08/18/2022)   Received from [place], [place]   Physical Activity    Physical Activity: 0  Stress: Not on File (08/18/2022)   Received from [facility], [place]   Stress    Stress: 0  Social  Connections: Not on File (02/15/2023)   Received from [facility]   Social Connections    Connectedness: 0   Additional Social History:    Allergies:   Allergies  Allergen Reactions   Other Itching, Rash and Other (See Comments)    Peanuts   Tylenol [Acetaminophen] Rash    Labs:  Results for orders placed or performed during the hospital encounter of 04/10/23 (from the past 48 hour(s))  CBC     Status: None   Collection Time: 04/10/23  9:33 AM  Result Value Ref Range   WBC 7.9 4.0 - 10.5 K/uL   RBC 5.29 4.22 - 5.81 MIL/uL   Hemoglobin 16.7 13.0 - 17.0 g/dL   HCT 13.0 86.5 - 78.4 %   MCV 90.2 80.0 - 100.0 fL   MCH 31.6 26.0 - 34.0 pg   MCHC 35.0  30.0 - 36.0 g/dL   RDW 69.6 29.5 - 28.4 %   Platelets 298 150 - 400 K/uL   nRBC 0.0 0.0 - 0.2 %    Comment: Performed at [facility], [address] [place], [place] [address]  Comprehensive metabolic panel     Status: Abnormal   Collection Time: 04/10/23  9:33 AM  Result Value Ref Range   Sodium 134 (L) 135 - 145 mmol/L   Potassium 3.6 3.5 - 5.1 mmol/L   Chloride 102 98 - 111 mmol/L   CO2 18 (L) 22 - 32 mmol/L   Glucose, Bld 93 70 - 99 mg/dL    Comment: Glucose reference range applies only to samples taken after fasting for at least 8 hours.   BUN 12 6 - 20 mg/dL   Creatinine, Ser 0.10 0.61 - 1.24 mg/dL   Calcium 8.9 8.9 - 27.2 mg/dL   Total Protein 8.3 (H) 6.5 - 8.1 g/dL   Albumin 4.8 3.5 - 5.0 g/dL   AST 37 15 - 41 U/L   ALT 81 (H) 0 - 44 U/L   Alkaline Phosphatase 117 38 - 126 U/L   Total Bilirubin 0.5 <1.2 mg/dL   GFR, Estimated >53 >66 mL/min    Comment: (NOTE) Calculated using the CKD-EPI Creatinine Equation (2021)    Anion gap 14 5 - 15    Comment: Performed at [facility], [address] [place], [place] [address]  Lipase, blood     Status: None   Collection Time: 04/10/23  9:33 AM  Result Value Ref Range   Lipase 24 11 - 51 U/L    Comment: Performed at [facility], [address] [place], [place] [address]  Acetaminophen level     Status: None   Collection Time: 04/10/23  9:33 AM  Result Value Ref Range   Acetaminophen (Tylenol), Serum SST DRAWN C/WENDY NEAL AT 1046 04/10/23 DAS 10 - 30 ug/mL    Comment: Performed at [facility], [address] [place], [place] [address]  Salicylate level     Status: None   Collection Time: 04/10/23  9:33 AM  Result Value Ref Range   Salicylate Lvl SST DRAWN C/WENDY NEAL AT 1046 04/10/23 DAS 7.0 - 30.0 mg/dL    Comment: Performed at [facility], [address], [place], [place] [address]  Urine Drug Screen, Qualitative ([facility] only)     Status: Abnormal   Collection Time:  04/10/23  9:33 AM  Result Value Ref Range   Tricyclic, Ur Screen NONE DETECTED NONE DETECTED   Amphetamines, Ur Screen NONE DETECTED NONE DETECTED   MDMA (  Ecstasy)Ur Screen NONE DETECTED NONE DETECTED   Cocaine Metabolite,Ur [place] NONE DETECTED NONE DETECTED   Opiate, Ur Screen NONE DETECTED NONE DETECTED   Phencyclidine (PCP) Ur S NONE DETECTED NONE DETECTED   Cannabinoid 50 Ng, Ur [place] POSITIVE (A) NONE DETECTED   Barbiturates, Ur Screen NONE DETECTED NONE DETECTED   Benzodiazepine, Ur Scrn NONE DETECTED NONE DETECTED   Methadone Scn, Ur NONE DETECTED NONE DETECTED    Comment: (NOTE) Tricyclics + metabolites, urine    Cutoff 1000 ng/mL Amphetamines + metabolites, urine  Cutoff 1000 ng/mL MDMA (Ecstasy), urine              Cutoff 500 ng/mL Cocaine Metabolite, urine          Cutoff 300 ng/mL Opiate + metabolites, urine        Cutoff 300 ng/mL Phencyclidine (PCP), urine         Cutoff 25 ng/mL Cannabinoid, urine                 Cutoff 50 ng/mL Barbiturates + metabolites, urine  Cutoff 200 ng/mL Benzodiazepine, urine              Cutoff 200 ng/mL Methadone, urine                   Cutoff 300 ng/mL  The urine drug screen provides only a preliminary, unconfirmed analytical test result and should not be used for non-medical purposes. Clinical consideration and professional judgment should be applied to any positive drug screen result due to possible interfering substances. A more specific alternate chemical method must be used in order to obtain a confirmed analytical result. Gas chromatography / mass spectrometry (GC/MS) is the preferred confirm atory method. Performed at [name], [address] [place], [place] [address]   Ethanol     Status: None   Collection Time: 04/10/23  9:33 AM  Result Value Ref Range   Alcohol, Ethyl (B) SST DRAWN C/WENDY NEAL AT 1046 04/10/23 DAS <10 mg/dL    Comment: Performed at [facility], [address] [place], [place] [address]   Acetaminophen level     Status: Abnormal   Collection Time: 04/10/23 10:54 AM  Result Value Ref Range   Acetaminophen (Tylenol), Serum <10 (L) 10 - 30 ug/mL    Comment: (NOTE) Therapeutic concentrations vary significantly. A range of 10-30 ug/mL  may be an effective concentration for many patients. However, some  are best treated at concentrations outside of this range. Acetaminophen concentrations >150 ug/mL at 4 hours after ingestion  and >50 ug/mL at 12 hours after ingestion are often associated with  toxic reactions.  Performed at [facility], [address] [place], [place] [address]   Ethanol     Status: None   Collection Time: 04/10/23 10:54 AM  Result Value Ref Range   Alcohol, Ethyl (B) <10 <10 mg/dL    Comment: (NOTE) Lowest detectable limit for serum alcohol is 10 mg/dL.  For medical purposes only. Performed at [facility], [address] [place], [place] [address]   Salicylate level     Status: Abnormal   Collection Time: 04/10/23 10:54 AM  Result Value Ref Range   Salicylate Lvl <7.0 (L) 7.0 - 30.0 mg/dL    Comment: Performed at [facility], [address] [place], [place] [address]    Current Facility-Administered Medications  Medication Dose Route Frequency Provider Last Rate Last Admin   folic acid (FOLVITE) tablet 1 mg  1 mg Oral Daily Sharyn Creamer, MD   1 mg at 04/10/23 1252   levETIRAcetam (KEPPRA) tablet 500 mg  500 mg Oral BID Sharyn Creamer, MD   500 mg at 04/10/23 1114   LORazepam (ATIVAN) tablet 1-4 mg  1-4 mg Oral Q1H PRN Sharyn Creamer, MD       multivitamin with minerals tablet 1 tablet  1 tablet Oral Daily Sharyn Creamer, MD   1 tablet at 04/10/23 1252   thiamine (VITAMIN B1) tablet 100 mg  100 mg Oral Daily Sharyn Creamer, MD   100 mg at 04/10/23 1253   Or   thiamine (VITAMIN B1) injection 100 mg  100 mg Intravenous Daily Sharyn Creamer, MD       Current Outpatient Medications  Medication Sig Dispense Refill   albuterol  (VENTOLIN HFA) 108 (90 Base) MCG/ACT inhaler Inhale 2 puffs into the lungs every 4 (four) hours as needed for wheezing or shortness of breath. 6.7 g 0   FLUoxetine (PROZAC) 20 MG capsule Take 20 mg by mouth daily.     fluticasone-salmeterol (ADVAIR) 100-50 MCG/ACT AEPB Inhale 1 puff into the lungs 2 (two) times daily.     hydrOXYzine (ATARAX) 50 MG tablet Take 1 tablet (50 mg total) by mouth every 6 (six) hours as needed for anxiety. 10 tablet 0   levETIRAcetam (KEPPRA) 750 MG tablet Take 1 tablet (750 mg total) by mouth every 12 (twelve) hours. 60 tablet 0   lisinopril (ZESTRIL) 10 MG tablet Take 1 tablet (10 mg total) by mouth daily. 10 tablet 0   risperiDONE (RISPERDAL) 2 MG tablet Take 1 tablet (2 mg total) by mouth at bedtime. (Patient taking differently: Take 3 mg by mouth at bedtime.) 60 tablet 0   Tiotropium Bromide-Olodaterol 2.5-2.5 MCG/ACT AERS Inhale 2 puffs into the lungs daily.     traZODone (DESYREL) 100 MG tablet Take 1 tablet (100 mg total) by mouth at bedtime. (Patient taking differently: Take 150 mg by mouth at bedtime.) 30 tablet 0   FLUoxetine (PROZAC) 40 MG capsule Take 1 capsule (40 mg total) by mouth daily. (Patient not taking: Reported on 02/04/2023) 30 capsule 0   gabapentin (NEURONTIN) 100 MG capsule Take 1 capsule (100 mg total) by mouth 3 (three) times daily. (Patient not taking: Reported on 04/10/2023) 90 capsule 1   nicotine polacrilex (NICORETTE) 4 MG gum Take 1 each (4 mg total) by mouth 4 (four) times daily as needed for smoking cessation. (Patient not taking: Reported on 04/10/2023) 100 tablet 0   thiamine (VITAMIN B-1) 100 MG tablet Take 1 tablet (100 mg total) by mouth daily. (Patient not taking: Reported on 04/10/2023) 30 tablet 0    Musculoskeletal: Strength & Muscle Tone: within normal limits Gait & Station: normal Patient leans: N/A            Psychiatric Specialty Exam:  Presentation  General Appearance:  Disheveled  Eye  Contact: Good  Speech: Clear and Coherent  Speech Volume: Normal  Handedness: Right   Mood and Affect  Mood: Depressed  Affect: Congruent   Thought Process  Thought Processes: Coherent  Descriptions of Associations:Intact  Orientation:Full (Time, Place and Person)  Thought Content:Illogical  History of Schizophrenia/Schizoaffective disorder:No  Duration of Psychotic Symptoms:No data recorded Hallucinations:Hallucinations: Auditory  Ideas of Reference:None  Suicidal Thoughts:Suicidal Thoughts: Yes, Active SI Active Intent and/or Plan: With Intent; With Plan; With Means to Carry Out; With Access to Means  Homicidal Thoughts:Homicidal Thoughts: No   Sensorium  Memory: Immediate Good; Recent  Good; Remote Good  Judgment: Poor  Insight: Poor   Executive Functions  Concentration: Good  Attention Span: Good  Recall: Good  Fund of Knowledge: Good  Language: Good   Psychomotor Activity  Psychomotor Activity:Psychomotor Activity: Normal   Assets  Assets: Communication Skills; Desire for Improvement   Sleep  Sleep:Sleep: Poor   Physical Exam: Physical Exam Vitals and nursing note reviewed.  Neurological:     Mental Status: He is alert and oriented to person, place, and time.    Review of Systems  Psychiatric/Behavioral:  Positive for depression and suicidal ideas.   All other systems reviewed and are negative.  Blood pressure (!) 121/95, pulse 82, temperature 98.5 F (36.9 C), temperature source Oral, resp. rate 16, SpO2 98%. There is no height or weight on file to calculate BMI.  Treatment Plan Summary: Daily contact with patient to assess and evaluate symptoms and progress in treatment  Disposition: Recommend psychiatric Inpatient admission when medically cleared.  Mcneil Sober, NP 04/10/2023 3:51 PM

## 2023-04-10 NOTE — ED Notes (Signed)
Pt. Alert and oriented, warm and dry, in no distress. Pt. Denies SI, HI, and AVH. Medication complaint. Continues to request to be moved downstairs.

## 2023-04-10 NOTE — ED Notes (Signed)
IVC PENDING  CONSULT ?

## 2023-04-10 NOTE — BH Assessment (Signed)
Patient has been accepted to [facility].  Accepting physician is Dr. Sofie Hartigan.  Call report to [phone].  Representative was [employer].   ER Staff is aware of it:  Jackie Plum, ER Secretary  Dr. Erma Heritage, ER MD  Illene Labrador, Patient's Nurse     Patient can arrive at facility 04/11/23 after 9 AM.

## 2023-04-10 NOTE — ED Notes (Signed)
Pt was cooperative while changing out. Pt also was able to give urine and blood. All pt had was some clothing and keys with bracelet and shoes. Pt did keep stating he wanted to go back down stairs. Pt was educated on the process.

## 2023-04-10 NOTE — ED Notes (Signed)
Patient received lunch and beverage.

## 2023-04-11 NOTE — ED Notes (Signed)
IVC /Pt  transported to [facility]

## 2023-04-11 NOTE — ED Provider Notes (Addendum)
Emergency Medicine Observation Re-evaluation Note  Raymond [name] is a 42 y.o. male, seen on rounds today.  Pt initially presented to the ED for complaints of No chief complaint on file.  Currently, the patient is is no acute distress. Denies any concerns at this time.  Physical Exam  Blood pressure (!) 138/92, pulse 72, temperature 98.1 F (36.7 C), temperature source Oral, resp. rate 18, SpO2 99%.  Physical Exam: General: No apparent distress      ED Course / MDM     I have reviewed the labs performed to date as well as medications administered while in observation.  Recent changes in the last 24 hours include: No acute events overnight.  Plan   Current plan: Patient awaiting transfer to inpatient psych facility  Patient is under full IVC at this time.  Transferred to [facility] in stable condition    Corena Herter, MD 04/11/23 6962    Corena Herter, MD 04/11/23 1015    Corena Herter, MD 04/11/23 1015

## 2023-04-11 NOTE — ED Notes (Signed)
Pt provided with shower supplies. Pt went in shower room, pt did not take shower put changed clothes.

## 2023-04-11 NOTE — ED Notes (Signed)
EMTALA Reviewed by this RN.  

## 2023-04-11 NOTE — ED Notes (Signed)
Pt provided with breakfast tray. Pt sitting up eating

## 2023-04-11 NOTE — ED Notes (Signed)
Report given to Cyndia Bent, RN at [facility].

## 2023-04-11 NOTE — ED Notes (Signed)
ivc/patient has been accepted to [facility] can arrive at facility 04/11/23 after 9 AM.

## 2023-06-27 ENCOUNTER — Emergency Department (HOSPITAL_COMMUNITY)
Admission: EM | Admit: 2023-06-27 | Discharge: 2023-06-27 | Disposition: A | Discharge status: Home / Self Care | Payer: BLUE CROSS/BLUE SHIELD | Attending: Emergency Medicine | Admitting: Emergency Medicine

## 2023-06-27 ENCOUNTER — Emergency Department (HOSPITAL_COMMUNITY): Payer: BLUE CROSS/BLUE SHIELD

## 2023-06-27 ENCOUNTER — Other Ambulatory Visit: Payer: Self-pay

## 2023-06-27 ENCOUNTER — Encounter (HOSPITAL_COMMUNITY): Payer: Self-pay

## 2023-06-27 DIAGNOSIS — R55 Syncope and collapse: Secondary | ICD-10-CM | POA: Diagnosis not present

## 2023-06-27 DIAGNOSIS — F10220 Alcohol dependence with intoxication, uncomplicated: Secondary | ICD-10-CM | POA: Diagnosis present

## 2023-06-27 DIAGNOSIS — R7309 Other abnormal glucose: Secondary | ICD-10-CM | POA: Diagnosis not present

## 2023-06-27 DIAGNOSIS — F10920 Alcohol use, unspecified with intoxication, uncomplicated: Secondary | ICD-10-CM

## 2023-06-27 HISTORY — DX: Alcohol abuse, uncomplicated: F10.10

## 2023-06-27 LAB — BASIC METABOLIC PANEL
Anion gap: 11 (ref 5–15)
BUN: 8 mg/dL (ref 6–20)
CO2: 17 mmol/L — ABNORMAL LOW (ref 22–32)
Calcium: 9 mg/dL (ref 8.9–10.3)
Chloride: 109 mmol/L (ref 98–111)
Creatinine, Ser: 0.74 mg/dL (ref 0.61–1.24)
GFR, Estimated: >60 mL/min (ref >60)
Glucose, Bld: 109 mg/dL — ABNORMAL HIGH (ref 70–99)
Potassium: 3.7 mmol/L (ref 3.5–5.1)
Sodium: 137 mmol/L (ref 135–145)

## 2023-06-27 LAB — CBC
HCT: 46.9 % (ref 39.0–52.0)
Hemoglobin: 16.1 g/dL (ref 13.0–17.0)
MCH: 31.9 pg (ref 26.0–34.0)
MCHC: 34.3 g/dL (ref 30.0–36.0)
MCV: 93.1 fL (ref 80.0–100.0)
Platelets: 281 10*3/uL (ref 150–400)
RBC: 5.04 MIL/uL (ref 4.22–5.81)
RDW: 13.3 % (ref 11.5–15.5)
WBC: 6.3 10*3/uL (ref 4.0–10.5)
nRBC: 0 % (ref 0.0–0.2)

## 2023-06-27 LAB — MAGNESIUM: Magnesium: 2.5 mg/dL — ABNORMAL HIGH (ref 1.7–2.4)

## 2023-06-27 LAB — CBG MONITORING, ED: Glucose-Capillary: 103 mg/dL — ABNORMAL HIGH (ref 70–99)

## 2023-06-27 LAB — ETHANOL: Alcohol, Ethyl (B): 297 mg/dL — ABNORMAL HIGH (ref <10)

## 2023-06-27 MED ORDER — LEVETIRACETAM 750 MG PO TABS: 750.0000 mg | ORAL_TABLET | Freq: Two times a day (BID) | ORAL | 0 refills | Status: DC

## 2023-06-27 MED ORDER — LEVETIRACETAM 500 MG PO TABS
750.0000 mg | ORAL_TABLET | Freq: Once | ORAL | Status: AC
  Administered 2023-06-27: 750 mg via ORAL
  Filled 2023-06-27: qty 1

## 2023-06-27 MED ORDER — LEVETIRACETAM 500 MG PO TABS: 500.0000 mg | ORAL_TABLET | Freq: Once | ORAL | Status: DC

## 2023-06-27 NOTE — ED Triage Notes (Signed)
Patient intoxicated and had syncopal fall today, hitting the back of his head. Patient oriented to time and self but disoriented to situation. Patient reports drinking six 40oz beers today. Also reports smoking marijuana today. States he is drinking because his mom recently died an that's how he copes with the pain.

## 2023-06-27 NOTE — ED Notes (Signed)
Pt ambulated to restroom independently.

## 2023-06-27 NOTE — ED Provider Notes (Signed)
[place] EMERGENCY DEPARTMENT AT [facility] Provider Note  MDM   HPI/ROS:  Raymond [name] is a 43 y.o. male with pertinent past medical history of self-reported seizures, anxiety, depression, PTSD, EtOH abuse, hepatitis C who presents alcohol intoxication.  Per triage note patient reportedly had a syncopal fall today, hitting the back of his head.  Patient denies any recent falls or head trauma.  He does note that he ran out of his  Keppra 2 days ago and believes he had a seizure at that time which he states involves becoming dyspneic and having more difficulty falling asleep.  He currently has no complaints including chest pain, dyspnea, abdominal pain, lightheadedness, dizziness, vision changes, headache.  He reports that he consumed 2 forties of beer today after being sober for several months.  He denies any other substance use.    Physical exam is notable for: - Smells of alcohol, appears clinically intoxicated -- Very drowsy, however following sternal rub patient alert, speaking complete sentences with slightly slurred speech -- Oriented x 3 -- 5/5 strength in bilateral upper and lower extremities with cranial nerves intact -- No external signs of trauma  On my initial evaluation, patient is:  -Vital signs stable Patient afebrile, hemodynamically stable, and non-toxic appearing. -Additional history obtained from review of prior records  This patient's current presentation, including their history and physical exam, is most consistent with alcohol intoxication.  PCP glucose obtained on arrival within normal limits.  Although patient does have documented history of seizures and has been off of his Keppra for 2 days, he does not appear postictal and his reported seizure-like episode 2 days ago does not sound consistent with seizure.  He has no obvious signs of external trauma, however given report of fall today plan to obtain CT head.  Additionally obtaining basic labs given reports  of poor p.o. intake.  Low suspicion for infection, overdose.  EKG was obtained on arrival and did appear to demonstrate new T wave inversions in leads 2-3, no other significant changes and patient without any signs or symptoms that would concern for ACS.  Interpretations, interventions, and the patient's course of care are documented below.    -Labs reviewed: WBC 6.3, hemoglobin 16.1, creatinine 0.74, CO2 17 anion gap 11, ethanol level 297 -CT head with no evidence of intracranial hemorrhage or skull fracture -On reevaluation, patient fully awake, alert, eating -Able to ambulate without assistance with steady gait -Was given home dose of Keppra in the ED, will send with prescription on discharge  Disposition:  I discussed the plan for discharge with the patient and/or their surrogate at bedside prior to discharge and they were in agreement with the plan and verbalized understanding of the return precautions provided. All questions answered to the best of my ability. Ultimately, the patient was discharged in stable condition with stable vital signs.  He was provided with contact information for obtaining PCP and additionally given strict return precautions.  Clinical Impression:  1. Alcoholic intoxication without complication (HCC)     Rx / DC Orders ED Discharge Orders     None       The plan for this patient was discussed with Dr. Particia Nearing, who voiced agreement and who oversaw evaluation and treatment of this patient.   Clinical Complexity A medically appropriate history, review of systems, and physical exam was performed.  My independent interpretations of EKG, labs, and radiology are documented in the ED course above.   If decision rules were used in  this patient's evaluation, they are listed below.   Patient's presentation is most consistent with acute presentation with potential threat to life or bodily function.  Medical Decision Making Amount and/or Complexity of Data  Reviewed Labs: ordered. Radiology: ordered.  Risk Prescription drug management.    HPI/ROS      See MDM section for pertinent HPI and ROS. A complete ROS was performed with pertinent positives/negatives noted above.   Past Medical History:  Diagnosis Date   Alcohol abuse    Anxiety    Hepatitis C    PTSD (post-traumatic stress disorder)     Past Surgical History:  Procedure Laterality Date   FACIAL FRACTURE SURGERY     metal plate under right eye      Physical Exam   Vitals:   06/27/23 1816 06/27/23 1836  BP: 106/75   Pulse: 86   Resp: 14   Temp: 97.8 F (36.6 C)   TempSrc: Oral   SpO2: 97%   Weight:  81.6 kg  Height:  6\' 1"  (1.854 m)    Physical Exam Gen: NAD.  Appears clinically intoxicated.  Initially difficult to arouse, however significantly improved alertness following sternal rub and fully conversant.  HENT: Conjunctiva clear, PERRL, EOMI. MMM.  [place]/AT. CV: RRR. No M/R/G Pulm: Lungs CTAB with no wheezing, rales, or rhonchi.  GI: Abdomen soft, non-tender, non-distended. Normal bowel sounds in all 4 quadrants. MSK/Skin: No lower extremity edema. Extremities warm, well-perfused with 2+ pulses in all 4 extremities. Neuro: A&Ox3. GCS 13 (E2V5M6). Moves all extremities. 5/5 strength in bilateral upper and lower extremities with cranial nerves intact.  Mildly slurred speech  Procedures   If procedures were preformed on this patient, they are listed below:  Procedures   Mikeal Hawthorne, MD Emergency Medicine PGY-2   Please note that this documentation was produced with the assistance of voice-to-text technology and may contain errors.    Mikeal Hawthorne, MD 06/27/23 1610    Jacalyn Lefevre, MD 06/27/23 [phone]

## 2023-06-27 NOTE — Discharge Instructions (Addendum)
Emergency Department due to concern for loss of consciousness and intoxication.  Test performed while you are here include blood work, CT scan, and EKG.  Your ethanol level was quite elevated, however your tests were otherwise reassuring against any significant abnormalities or evidence of traumatic injuries. We gave you a dose of your Keppra while in the emergency department.  We are additionally providing you with a prescription for this medication that you can bring to any pharmacy of your choice. He can additionally contact the phone number above to obtain a primary care physician.  They can help you continue to manage your medications in the outpatient setting. If you experience loss of consciousness, severe chest pain, seizures, or any other concerns, return to the ED for reevaluation.

## 2023-06-27 NOTE — ED Notes (Signed)
Pt refused repeat vitals because he "had to catch the bus."

## 2023-08-08 ENCOUNTER — Emergency Department (HOSPITAL_COMMUNITY)
Admission: EM | Admit: 2023-08-08 | Discharge: 2023-08-09 | Disposition: A | Discharge status: Other Acute Inpatient Hospital | Source: Home / Self Care | Attending: Emergency Medicine | Admitting: Emergency Medicine

## 2023-08-08 ENCOUNTER — Encounter (HOSPITAL_COMMUNITY): Payer: Self-pay | Admitting: Psychiatry

## 2023-08-08 DIAGNOSIS — F1090 Alcohol use, unspecified, uncomplicated: Secondary | ICD-10-CM | POA: Insufficient documentation

## 2023-08-08 DIAGNOSIS — T426X2A Poisoning by other antiepileptic and sedative-hypnotic drugs, intentional self-harm, initial encounter: Secondary | ICD-10-CM | POA: Insufficient documentation

## 2023-08-08 DIAGNOSIS — X838XXA Intentional self-harm by other specified means, initial encounter: Secondary | ICD-10-CM | POA: Insufficient documentation

## 2023-08-08 DIAGNOSIS — F332 Major depressive disorder, recurrent severe without psychotic features: Secondary | ICD-10-CM

## 2023-08-08 DIAGNOSIS — Z91010 Allergy to peanuts: Secondary | ICD-10-CM | POA: Insufficient documentation

## 2023-08-08 DIAGNOSIS — T426X2D Poisoning by other antiepileptic and sedative-hypnotic drugs, intentional self-harm, subsequent encounter: Secondary | ICD-10-CM | POA: Diagnosis not present

## 2023-08-08 DIAGNOSIS — F1010 Alcohol abuse, uncomplicated: Secondary | ICD-10-CM

## 2023-08-08 DIAGNOSIS — R45851 Suicidal ideations: Secondary | ICD-10-CM

## 2023-08-08 DIAGNOSIS — T6592XA Toxic effect of unspecified substance, intentional self-harm, initial encounter: Secondary | ICD-10-CM

## 2023-08-08 DIAGNOSIS — Z8659 Personal history of other mental and behavioral disorders: Secondary | ICD-10-CM

## 2023-08-08 DIAGNOSIS — T6591XA Toxic effect of unspecified substance, accidental (unintentional), initial encounter: Secondary | ICD-10-CM

## 2023-08-08 LAB — RAPID URINE DRUG SCREEN, HOSP PERFORMED
Amphetamines: NOT DETECTED
Barbiturates: NOT DETECTED
Benzodiazepines: NOT DETECTED
Cocaine: NOT DETECTED
Opiates: NOT DETECTED
Tetrahydrocannabinol: POSITIVE — AB

## 2023-08-08 LAB — COMPREHENSIVE METABOLIC PANEL
ALT: 40 U/L (ref 0–44)
AST: 32 U/L (ref 15–41)
Albumin: 4.3 g/dL (ref 3.5–5.0)
Alkaline Phosphatase: 65 U/L (ref 38–126)
Anion gap: 14 (ref 5–15)
BUN: 12 mg/dL (ref 6–20)
CO2: 17 mmol/L — ABNORMAL LOW (ref 22–32)
Calcium: 8.9 mg/dL (ref 8.9–10.3)
Chloride: 107 mmol/L (ref 98–111)
Creatinine, Ser: 0.74 mg/dL (ref 0.61–1.24)
GFR, Estimated: >60 mL/min (ref >60)
Glucose, Bld: 111 mg/dL — ABNORMAL HIGH (ref 70–99)
Potassium: 3.7 mmol/L (ref 3.5–5.1)
Sodium: 138 mmol/L (ref 135–145)
Total Bilirubin: 0.6 mg/dL (ref 0.0–1.2)
Total Protein: 7.7 g/dL (ref 6.5–8.1)

## 2023-08-08 LAB — CBC
HCT: 48.9 % (ref 39.0–52.0)
Hemoglobin: 16 g/dL (ref 13.0–17.0)
MCH: 31.7 pg (ref 26.0–34.0)
MCHC: 32.7 g/dL (ref 30.0–36.0)
MCV: 97 fL (ref 80.0–100.0)
Platelets: 264 10*3/uL (ref 150–400)
RBC: 5.04 MIL/uL (ref 4.22–5.81)
RDW: 13.2 % (ref 11.5–15.5)
WBC: 7.2 10*3/uL (ref 4.0–10.5)
nRBC: 0 % (ref 0.0–0.2)

## 2023-08-08 LAB — ETHANOL: Alcohol, Ethyl (B): <10 mg/dL (ref <10)

## 2023-08-08 LAB — ACETAMINOPHEN LEVEL: Acetaminophen (Tylenol), Serum: <10 ug/mL — ABNORMAL LOW (ref 10–30)

## 2023-08-08 LAB — SALICYLATE LEVEL: Salicylate Lvl: <7 mg/dL — ABNORMAL LOW (ref 7.0–30.0)

## 2023-08-08 MED ORDER — OLANZAPINE 10 MG PO TBDP: 10.0000 mg | ORAL_TABLET | Freq: Every day | ORAL | Status: DC | PRN

## 2023-08-08 MED ORDER — OLANZAPINE 10 MG IM SOLR: 10.0000 mg | Freq: Every day | INTRAMUSCULAR | Status: DC | PRN

## 2023-08-08 MED ORDER — FLUOXETINE HCL 20 MG PO CAPS
40.0000 mg | ORAL_CAPSULE | Freq: Every day | ORAL | Status: DC
  Administered 2023-08-09: 40 mg via ORAL
  Filled 2023-08-08: qty 2

## 2023-08-08 MED ORDER — RISPERIDONE 2 MG PO TABS
3.0000 mg | ORAL_TABLET | Freq: Every day | ORAL | Status: DC
Start: 2023-08-08 — End: 2023-08-09
  Administered 2023-08-08: 3 mg via ORAL
  Filled 2023-08-08: qty 2

## 2023-08-08 MED ORDER — TRAZODONE HCL 50 MG PO TABS
150.0000 mg | ORAL_TABLET | Freq: Every day | ORAL | Status: DC
  Administered 2023-08-08: 150 mg via ORAL
  Filled 2023-08-08: qty 1

## 2023-08-08 MED ORDER — DIPHENHYDRAMINE HCL 25 MG PO CAPS
50.0000 mg | ORAL_CAPSULE | Freq: Four times a day (QID) | ORAL | Status: DC | PRN
Start: 2023-08-08 — End: 2023-08-09

## 2023-08-08 MED ORDER — DIPHENHYDRAMINE HCL 50 MG/ML IJ SOLN: 50.0000 mg | Freq: Four times a day (QID) | INTRAMUSCULAR | Status: DC | PRN

## 2023-08-08 MED ORDER — LORAZEPAM 1 MG PO TABS: 2.0000 mg | ORAL_TABLET | Freq: Four times a day (QID) | ORAL | Status: DC | PRN

## 2023-08-08 MED ORDER — RISPERIDONE 2 MG PO TABS
2.0000 mg | ORAL_TABLET | Freq: Every day | ORAL | Status: DC
  Administered 2023-08-09: 2 mg via ORAL
  Filled 2023-08-08: qty 1

## 2023-08-08 NOTE — ED Notes (Signed)
 Three bags of patient's belongings placed in TCU locker 37

## 2023-08-08 NOTE — ED Notes (Signed)
 Patient is doing TTS at this time.

## 2023-08-08 NOTE — ED Triage Notes (Signed)
 Patient by [facility] from [facility] Patient c/o feeling suicidal today, ran out of seizure medicine, allegedly took 90 seizure pills a couple days ago Reports having several seizures this morning  Patient says he is still feeling suicidal , if he wouldn't have ran out of pills he would have taken more and killed himself Patient denies pain Says he is nauseous and dizzy

## 2023-08-08 NOTE — BH Assessment (Signed)
 [facility] contacted @ 1939. Kade w/ [facility] placed patient on the schedule to be seen.

## 2023-08-08 NOTE — ED Provider Notes (Signed)
 [place] EMERGENCY DEPARTMENT AT [facility] Provider Note   CSN: [id number] Arrival date & time: 08/08/23  1617     History  Chief Complaint  Patient presents with   Suicidal    Raymond [name] is a 43 y.o. male.  43 yo M with a cc of SI.  States he took about 90 keppra's and trazodones over the past few days.    Wants to end his life and thinks he will take more medicines or hang himself.  Tells me he has done this before.         Home Medications Prior to Admission medications   Medication Sig Start Date End Date Taking? Authorizing Provider  albuterol (VENTOLIN HFA) 108 (90 Base) MCG/ACT inhaler Inhale 2 puffs into the lungs every 4 (four) hours as needed for wheezing or shortness of breath. 10/16/22   Clapacs, Jackquline Denmark, MD  FLUoxetine (PROZAC) 20 MG capsule Take 20 mg by mouth daily.    [provider]  FLUoxetine (PROZAC) 40 MG capsule Take 1 capsule (40 mg total) by mouth daily. Patient not taking: Reported on 02/04/2023 11/08/22   Rex Kras, MD  fluticasone-salmeterol (ADVAIR) 100-50 MCG/ACT AEPB Inhale 1 puff into the lungs 2 (two) times daily.    [provider]  gabapentin (NEURONTIN) 100 MG capsule Take 1 capsule (100 mg total) by mouth 3 (three) times daily. Patient not taking: Reported on 04/10/2023 04/03/23 05/03/23  Myriam Forehand, NP  hydrOXYzine (ATARAX) 50 MG tablet Take 1 tablet (50 mg total) by mouth every 6 (six) hours as needed for anxiety. 10/16/22   Clapacs, Jackquline Denmark, MD  levETIRAcetam (KEPPRA) 750 MG tablet Take 1 tablet (750 mg total) by mouth every 12 (twelve) hours. 06/27/23 07/27/23  Mikeal Hawthorne, MD  lisinopril (ZESTRIL) 10 MG tablet Take 1 tablet (10 mg total) by mouth daily. 10/17/22   Clapacs, Jackquline Denmark, MD  nicotine polacrilex (NICORETTE) 4 MG gum Take 1 each (4 mg total) by mouth 4 (four) times daily as needed for smoking cessation. Patient not taking: Reported on 04/10/2023 04/03/23   Myriam Forehand, NP  risperiDONE  (RISPERDAL) 2 MG tablet Take 1 tablet (2 mg total) by mouth at bedtime. Patient taking differently: Take 3 mg by mouth at bedtime. 04/03/23 05/03/23  Myriam Forehand, NP  thiamine (VITAMIN B-1) 100 MG tablet Take 1 tablet (100 mg total) by mouth daily. Patient not taking: Reported on 04/10/2023 04/04/23 05/04/23  Myriam Forehand, NP  Tiotropium Bromide-Olodaterol 2.5-2.5 MCG/ACT AERS Inhale 2 puffs into the lungs daily.    [provider]  traZODone (DESYREL) 100 MG tablet Take 1 tablet (100 mg total) by mouth at bedtime. Patient taking differently: Take 150 mg by mouth at bedtime. 11/07/22   Rex Kras, MD      Allergies    Peanut butter flavor [flavoring agent], Other, and Tylenol [acetaminophen]    Review of Systems   Review of Systems  Physical Exam Updated Vital Signs BP (!) 110/96 (BP Location: Right Arm)   Pulse (!) 105   Temp 98.1 F (36.7 C) (Oral)   Resp 16   Ht 6\' 1"  (1.854 m)   Wt 81.6 kg   SpO2 95%   BMI 23.75 kg/m  Physical Exam Vitals and nursing note reviewed.  Constitutional:      Appearance: He is well-developed.  HENT:     Head: Normocephalic and atraumatic.  Eyes:     Pupils: Pupils are equal, round, and reactive  to light.  Neck:     Vascular: No JVD.  Cardiovascular:     Rate and Rhythm: Normal rate and regular rhythm.     Heart sounds: No murmur heard.    No friction rub. No gallop.  Pulmonary:     Effort: No respiratory distress.     Breath sounds: No wheezing.  Abdominal:     General: There is no distension.     Tenderness: There is no abdominal tenderness. There is no guarding or rebound.  Musculoskeletal:        General: Normal range of motion.     Cervical back: Normal range of motion and neck supple.  Skin:    Coloration: Skin is not pale.     Findings: No rash.  Neurological:     Mental Status: He is alert and oriented to person, place, and time.  Psychiatric:        Behavior: Behavior normal.     ED Results / Procedures /  Treatments   Labs (all labs ordered are listed, but only abnormal results are displayed) Labs Reviewed  COMPREHENSIVE METABOLIC PANEL - Abnormal; Notable for the following components:      Result Value   CO2 17 (*)    Glucose, Bld 111 (*)    All other components within normal limits  SALICYLATE LEVEL - Abnormal; Notable for the following components:   Salicylate Lvl <7.0 (*)    All other components within normal limits  ACETAMINOPHEN LEVEL - Abnormal; Notable for the following components:   Acetaminophen (Tylenol), Serum <10 (*)    All other components within normal limits  ETHANOL  CBC  RAPID URINE DRUG SCREEN, HOSP PERFORMED    EKG None  Radiology No results found.  Procedures Procedures    Medications Ordered in ED Medications - No data to display  ED Course/ Medical Decision Making/ A&P                                 Medical Decision Making Amount and/or Complexity of Data Reviewed Labs: ordered.   43 yo M with a cc of SI. Patient said he overdosed on 90 tablets of keppra and trazodone over the past few days.  I think this is unlikely as the patient is not sedated at all.  Has no signs of serotonin syndrome.  He was mildly tachycardic on triage vitals but not tachycardic in the room.  I feel he is medically clear for TTS evaluation.  Labs without electrolyte abnormality.  No anemia, tylenol and salicylate levels negative.  The patients results and plan were reviewed and discussed.   Any x-rays performed were independently reviewed by myself.   Differential diagnosis were considered with the presenting HPI.  Medications - No data to display  Vitals:   08/08/23 1625 08/08/23 1633  BP: (!) 110/96   Pulse: (!) 105   Resp: 16   Temp: 98.1 F (36.7 C)   TempSrc: Oral   SpO2: 95%   Weight: 81.6 kg 81.6 kg  Height: 6\' 1"  (1.854 m) 6\' 1"  (1.854 m)    Final diagnoses:  Suicidal ideation           Final Clinical Impression(s) / ED  Diagnoses Final diagnoses:  Suicidal ideation    Rx / DC Orders ED Discharge Orders     None         Melene Plan, DO 08/08/23 1920

## 2023-08-08 NOTE — Consult Note (Addendum)
 [facility] Consult Note  Patient Name: Raymond [name] MRN: [id number] DOB: 1981-04-21 DATE OF Consult: 08/08/2023  PRIMARY PSYCHIATRIC DIAGNOSES  1.  MDD, recurrent, severe without psychosis 2.  PTSD, hx 3.  Alcohol Use Disorder 4. Suicidal Gesture with intentional ingestion of medication  RECOMMENDATIONS  Inpt psych admission recommended: once medically cleared    [x] YES       []  NO   If yes:       [x]   Pt meets involuntary commitment criteria if not voluntary       []    Pt does not meet involuntary commitment criteria and must be         voluntary. If patient is not voluntary, then discharge is recommended.   Medication recommendations:  labs appear within normal ranges for restarting medications; reviewed EKG  Recommend increase FLUOXETINE HCL 40MG  PO IN MORNING Continue with  RISPERIDONE 2MG  TAB 1MG  MOUTH IN THE MORNING ACTIVE AND 3MG  MOUTH AT BEDTIME TRAZODONE HCL 150MG  TAB 150MG  MOUTH AT BEDTIME ACTIVE  PRNs give olanzapine/zydis 10mg  po/IM  twice daily prn for agitation/aggressive behaviors benadryl 50mg  po/IM every 6 hours as needed for agitation/aggressive behaviors/EPS  Labs and EKG reviewed Qtc 395   Please ensure K> 4, Mg> 2 and Qtc < 500 when using antipsychotic medications and must monitor for extrapyramidal syndrome (EPS) such as dystonia, akathisia, and tardive dyskinesia.   Non-Medication recommendations:  suicide safety precautions; seizure precautions; monitor CIWA   Plan Post Discharge/Psychiatric Care Follow-up resources [facility] with [facility]  Follow-Up Telepsychiatry C/L services: We will sign off for now. Please re-consult our service if needed for any concerning changes in the patient's condition, discharge planning, or questions.  Communication: Treatment team members (and family members if applicable) who were involved in treatment/care discussions and planning, and with whom we spoke or engaged with via secure text/chat, include the  following: Dr. Adela Lank, [facility],  [facility] Paramedic;  epic chat   I have discussed my assessment and treatment recommendations with the patient. Possible medication side effects/risks/benefits of current regimen.   Importance of medication adherence for medication to be beneficial.     Thank you for involving [place] in the care of this patient. If you have any additional questions or concerns, please call [phone] and ask for me or the provider on-call.  TELEPSYCHIATRY ATTESTATION & CONSENT  As the provider for this telehealth consult, I attest that I verified the patient's identity using two separate identifiers, introduced myself to the patient, provided my credentials, disclosed my location, and performed this encounter via a HIPAA-compliant, real-time, face-to-face, two-way, interactive audio and video platform and with the full consent and agreement of the patient (or guardian as applicable.)  Patient physical location: [facility] ED. Telehealth provider physical location: home office in state of [place]  Video start time: 21:52pm (Central Time) Video end time: 22:13pm  (Central Time)  IDENTIFYING DATA  Raymond [name] is a 43 y.o. year-old male for whom a psychiatric consultation has been ordered by the primary provider. The patient was identified using two separate identifiers.  CHIEF COMPLAINT/REASON FOR CONSULT  "I tried to take my life, I took 90 pills today, 4 seizures in 2 days, and I think I have lung cancer, I would like to go to [place] or [place]".   HISTORY OF PRESENT ILLNESS (HPI)  The patient 43 yo male presented to emergency department reporting (copied) "suicidal today, ran out of seizure medicine, allegedly took 90 seizure pills  a couple days ago Reports having several seizures this morning Patient says he is still feeling suicidal , if he wouldn't have ran out of pills he would have taken more and killed himself" (end copied)    He reports to this  provider he took "keppra, tylenol, trazodone, Risperdal"  He reports he is having testing for potential for lung cancer.  "I have a tumor on the left side of my lung needs removed".    Hx of treatment for   seizures, anxiety, depression, PTSD, EtOH abuse, hepatitis C     Currently prescribed: fluoxetine, trazodone, risperidone  Reports he is disappointed he didn't die, "I ran out of pills", reports he is still actively suicidal "guess not my time to go God didn't want me to die yet, he wanted me to seek help".  Reports stressor are his mom died 2 years ago, hx of losing 2 children from miscarriage; he is unable to relate any current stressor    Today, client denied symptoms of depression with anergia, anhedonia, amotivation, no anxiety, frequent worry, feeling restlessness, no reported panic symptoms, no reported obsessive/compulsive behaviors. Client denies active SI/HI ideations, plans or intent. There is no evidence of psychosis or delusional thinking.  Client denied past episodes of hypomania, hyperactivity, erratic/excessive spending, involvement in dangerous activities, self-inflated ego, grandiosity, or promiscuity.  sleeping 4-5hrs/24hrs, appetite good; goes to food kitchen, concentration decreased  Client denied any current binging/purging behaviors, denied withholding food from self or engaging in anorexic behaviors. No self-harm behaviors. Reviewed active outpatient medication list/reviewed labs. Obtained Collateral information from medical record.  Poor historian, questionable reliability ? Delusional thinking given reported hx education and medical complaints; need validation  PAST PSYCHIATRIC HISTORY    Previous Psychiatric Hospitalizations: 8 times; last 3 months ago Previous Detox/Residential treatments: denied Outpt treatment:  [facility] has case manager [employer];  was last eval one week ago  Previous psychotropic medication trials:  fluoxetine, risperidone trazodone Previous mental health diagnosis per client/MEDICAL RECORD NUMBERMajor depressive disorder, recurrent, severe without psychotic  alcohol use disorder, stimulant use disorder, anxiety, PTSD, cannabis use disorder  Suicide attempts/self-injurious behaviors:  hx of overdosing; hx of attempted hang self "just didn't go the way I planned it"  History of trauma/abuse/neglect/exploitation:  unable to name a specific trauma other than "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"   PAST MEDICAL HISTORY  Past Medical History:  Diagnosis Date   Alcohol abuse    Anxiety    Hepatitis C    PTSD (post-traumatic stress disorder)      HOME MEDICATIONS  ALBUTEROL (CFC-F) 200D ORAL INHL INHALE 2 PUFFS ACTIVE BY MOUTH FOUR TIMES A DAY AS NEEDED FOR SHORTNESS OF BREATH 2) FLUTICAS 100/SALMETEROL 50 INHL DISK 60 INHALE 1 ACTIVE INHALATION BY MOUTH TWICE A DAY FOR COPD (RINSE MOUTH WELL WITH WATER AFTER EACH USE) 3) LEVETIRACETAM 1000MG  TAB TAKE ONE TABLET BY MOUTH ACTIVE TWICE A DAY FOR SEIZURE CONTROL 4) LISINOPRIL 10MG  TAB TAKE ONE TABLET BY MOUTH DAILY ACTIVE FOR HIGH BLOOD PRESSURE 5) TIOTROPIUM 2.5MCG/ACTUAT 60D ORAL INHL INHALE 2 PUFFS ACTIVE BY MOUTH ONCE A DAY FOR COPD  Active Non-VA Medications Status ========================================================================= 1) Non-VA ALBUTEROL (CFC-F) 200D ORAL INHL 2 PUFFS ACTIVE MOUTH 2) Non-VA FLUOXETINE HCL 20MG  CAP 20MG  MOUTH IN THE ACTIVE MORNING 3) Non-VA RISPERIDONE 2MG  TAB 1MG  MOUTH IN THE MORNING ACTIVE AND 3MG  MOUTH AT BEDTIME 4) Non-VA TRAZODONE HCL 150MG  TAB 150MG  MOUTH AT BEDTIME ACTIVE  ALLERGIES  Allergies  Allergen Reactions   Peanut Butter Flavor [Flavoring Agent]    Other Itching, Rash and Other (See Comments)    Peanuts   Tylenol [Acetaminophen] Rash    SOCIAL & SUBSTANCE USE HISTORY   has siblings: 3 brothers "we are tight" then stated not seen in 4  yrs, talk to one on  phone; talks with dad on phone  Living Situation: lives alone in apartment Single; no children "lost 2 babies miscarriage"                 Unemployed; no income; 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 Tobacco products Y  amount: "not all the time, when I can get it" b. ETOH  Y  last drink  3months ago c. Cannabis Y last use  5months ago d. Cocaine denied e. Prescription Stimulants denied f. Methamphetamine he denied but noted hx of use in past record g. Inhalants denied h. Sedative/sleeping pills denied  i. Hallucinogens denied j. Street Opioids denied k. Prescription opioids denied l. Other: specify (spice, K2, bath salts, etc.)  denied         FAMILY HISTORY   Family Psychiatric History (if known):  denied psychiatric illnesses, father hx drugs/alcohol; no suicides   MENTAL STATUS EXAM (MSE)  Mental Status Exam: General Appearance: Disheveled  Orientation:  Full (Time, Place, and Person)  Memory:  Immediate;   Good Recent;   Fair Remote;   Fair  Concentration:  Concentration: Fair  Recall:  Fair  Attention  Good  Eye Contact:  Good  Speech:  Clear and Coherent and Pressured  Language:  Good  Volume:  Normal  Mood: depressed  Affect:  Flat  Thought Process:  Descriptions of Associations: Circumstantial  Thought Content:  Delusions, Paranoid Ideation, and Rumination  Suicidal Thoughts:  Yes.  with intent/plan  Homicidal Thoughts:  No   Judgement:  Impaired  Insight:  Lacking  Psychomotor Activity:  Restlessness  Akathisia:  Negative  Fund of Knowledge:  Fair    Assets:  Housing  Cognition:  WNL  ADL's:  Intact  AIMS (if indicated):       VITALS  Blood pressure (!) 110/96, pulse (!) 105, temperature 98.1 F (36.7 C), temperature source Oral, resp. rate 16, height 6\' 1"  (1.854 m), weight 81.6 kg, SpO2 95%.  LABS  Admission on 08/08/2023  Component Date Value Ref Range Status   Sodium 08/08/2023 138  135 - 145 mmol/L Final   Potassium 08/08/2023 3.7  3.5 - 5.1 mmol/L Final   Chloride 08/08/2023 107  98 - 111 mmol/L Final   CO2 08/08/2023 17 (L)  22 - 32 mmol/L Final   Glucose, Bld 08/08/2023 111 (H)  70 - 99 mg/dL Final   Glucose reference range applies only to samples taken after fasting for at least 8 hours.   BUN 08/08/2023 12  6 - 20 mg/dL Final   Creatinine, Ser 08/08/2023 0.74  0.61 -  1.24 mg/dL Final   Calcium 16/03/9603 8.9  8.9 - 10.3 mg/dL Final   Total Protein 54/01/8118 7.7  6.5 - 8.1 g/dL Final   Albumin 14/78/2956 4.3  3.5 - 5.0 g/dL Final   AST 21/30/8657 32  15 - 41 U/L Final   ALT 08/08/2023 40  0 - 44 U/L Final   Alkaline Phosphatase 08/08/2023 65  38 - 126 U/L Final   Total Bilirubin 08/08/2023 0.6  0.0 - 1.2 mg/dL Final   GFR, Estimated 08/08/2023 >60  >60 mL/min Final   Comment: (NOTE) Calculated using the CKD-EPI Creatinine Equation (2021)    Anion gap 08/08/2023 14  5 - 15 Final   Performed at [facility], [address] [place], [place] [address]   Alcohol, Ethyl (B) 08/08/2023 <10  <10 mg/dL Final   Comment: (NOTE) Lowest detectable limit for serum alcohol is 10 mg/dL.  For medical purposes only. Performed at [facility], [address] [place], [place] [address]    Salicylate Lvl 08/08/2023 <7.0 (L)  7.0 - 30.0 mg/dL Final   Performed at [facility], [address] [place], [place] [address]   Acetaminophen  (Tylenol), Serum 08/08/2023 <10 (L)  10 - 30 ug/mL Final   Comment: (NOTE) Therapeutic concentrations vary significantly. A range of 10-30 ug/mL  may be an effective concentration for many patients. However, some  are best treated at concentrations outside of this range. Acetaminophen concentrations >150 ug/mL at 4 hours after ingestion  and >50 ug/mL at 12 hours after ingestion are often associated with  toxic reactions.  Performed at [facility], [address], [place], [place] [address]    WBC 08/08/2023 7.2  4.0 - 10.5 K/uL Final   RBC 08/08/2023 5.04  4.22 - 5.81 MIL/uL Final   Hemoglobin 08/08/2023 16.0  13.0 - 17.0 g/dL Final   HCT 72/53/6644 48.9  39.0 - 52.0 % Final   MCV 08/08/2023 97.0  80.0 - 100.0 fL Final   MCH 08/08/2023 31.7  26.0 - 34.0 pg Final   MCHC 08/08/2023 32.7  30.0 - 36.0 g/dL Final   RDW 03/47/4259 13.2  11.5 - 15.5 % Final   Platelets 08/08/2023 264  150 - 400 K/uL Final   nRBC 08/08/2023 0.0  0.0 - 0.2 % Final   Performed at [facility], [address], [place], [place] [address]   Opiates 08/08/2023 NONE DETECTED  NONE DETECTED Final   Cocaine 08/08/2023 NONE DETECTED  NONE DETECTED Final   Benzodiazepines 08/08/2023 NONE DETECTED  NONE DETECTED Final   Amphetamines 08/08/2023 NONE DETECTED  NONE DETECTED Final   Tetrahydrocannabinol 08/08/2023 POSITIVE (A)  NONE DETECTED Final   Barbiturates 08/08/2023 NONE DETECTED  NONE DETECTED Final   Comment: (NOTE) DRUG SCREEN FOR MEDICAL PURPOSES ONLY.  IF CONFIRMATION IS NEEDED FOR ANY PURPOSE, NOTIFY LAB WITHIN 5 DAYS.  LOWEST DETECTABLE LIMITS FOR URINE DRUG SCREEN Drug Class                     Cutoff (ng/mL) Amphetamine and metabolites    1000 Barbiturate and metabolites    200 Benzodiazepine                 200 Opiates and metabolites        300 Cocaine and metabolites        300 THC  50 Performed at [facility], [address] [place], [place] [address]     PSYCHIATRIC REVIEW OF SYSTEMS (ROS)  Depression:      []  Denies all symptoms of depression [x] Depressed mood       [x] Insomnia/hypersomnia              [x] Fatigue        [] Change in appetite     [x] Anhedonia                                [x] Difficulty concentrating      [x] Hopelessness             [x] Worthlessness [] Guilt/shame                [x] Psychomotor agitation/retardation   Mania:     [x] Denies all symptoms of mania [] Elevated mood           [] Irritability         [] Pressured speech         []  Grandiosity         []  Decreased need for sleep                                                 [] Increased energy          []  Increase in goal directed activity                                       [] Flight of ideas    []  Excessive involvement in high-risk behaviors                   []  Distractibility     Psychosis:     [] Denies all symptoms of psychosis [] Paranoia         []  Auditory Hallucinations          [] Visual hallucinations         [] ELOC        [] IOR                [x] Delusions--? Regarding education, medical concerns need validation   Suicide:    []  Denies SI/plan/intent []  Passive SI         [x]   Active SI         [x] Plan           [x] Intent   Homicide:  [x]   Denies HI/plan/intent []  Passive HI         []  Active HI         [] Plan            [] Intent           [] Identified Target    Additional findings:      Musculoskeletal: No abnormal movements observed      Gait & Station: Laying/Sitting      Pain Screening: Denies      Nutrition & Dental Concerns: no concerns noted other than access to food; goes to food kitchen for meals  RISK FORMULATION/ASSESSMENT  Is the patient experiencing any suicidal or homicidal ideations: Yes       Explain if yes: reports intentional ingestion of multiple medication in attempt to  kill self; questionable reliability give lab results; however, he reports he will continue to try to kill  self if he is discharged  Protective factors considered for safety management:   Absence of psychosis Access to adequate health care Advice& help seeking Resourcefulness/Survival skills Spirituality Positive therapeutic relationship  Risk factors/concerns considered for safety management:  Prior attempt Depression Substance abuse/dependence Physical illness/chronic pain Access to lethal means Hopelessness Impulsivity Isolation Male gender Unmarried  Is there a safety management plan with the patient and treatment team to minimize risk factors and promote protective factors: Yes           Explain: suicide safety precautions  Is crisis care placement or psychiatric hospitalization recommended: Yes     Based on my current evaluation and risk assessment, patient is determined at this time to be at:  High risk  *RISK ASSESSMENT Risk assessment is a dynamic process; it is possible that this patient's condition, and risk level, may change. This should be re-evaluated and managed over time as appropriate. Please re-consult psychiatric consult services if additional assistance is needed in terms of risk assessment and management. If your team decides to discharge this patient, please advise the patient how to best access emergency psychiatric services, or to call 911, if their condition worsens or they feel unsafe in any way.  Total time spent in this encounter was 60 minutes with greater than 50% of time spent in counseling and coordination of care.     Dr. Olivia Mackie. Christell Constant, PhD, MSN, APRN, PMHNP-BC, [facility], NP Telepsychiatry Consult Services

## 2023-08-09 ENCOUNTER — Encounter (HOSPITAL_COMMUNITY): Payer: Self-pay | Admitting: Nurse Practitioner

## 2023-08-09 ENCOUNTER — Other Ambulatory Visit: Payer: Self-pay

## 2023-08-09 ENCOUNTER — Inpatient Hospital Stay (HOSPITAL_COMMUNITY)
Admission: AD | Admit: 2023-08-09 | Discharge: 2023-08-18 | DRG: 885 | Disposition: A | Discharge status: Home / Self Care | Source: Intra-hospital | Attending: Psychiatry | Admitting: Psychiatry

## 2023-08-09 DIAGNOSIS — F1010 Alcohol abuse, uncomplicated: Secondary | ICD-10-CM | POA: Diagnosis present

## 2023-08-09 DIAGNOSIS — Z813 Family history of other psychoactive substance abuse and dependence: Secondary | ICD-10-CM | POA: Diagnosis not present

## 2023-08-09 DIAGNOSIS — R45851 Suicidal ideations: Secondary | ICD-10-CM | POA: Diagnosis present

## 2023-08-09 DIAGNOSIS — Z886 Allergy status to analgesic agent status: Secondary | ICD-10-CM

## 2023-08-09 DIAGNOSIS — F1090 Alcohol use, unspecified, uncomplicated: Secondary | ICD-10-CM | POA: Diagnosis not present

## 2023-08-09 DIAGNOSIS — F411 Generalized anxiety disorder: Secondary | ICD-10-CM | POA: Diagnosis present

## 2023-08-09 DIAGNOSIS — Z9185 Personal history of military service: Secondary | ICD-10-CM

## 2023-08-09 DIAGNOSIS — G4700 Insomnia, unspecified: Secondary | ICD-10-CM | POA: Diagnosis present

## 2023-08-09 DIAGNOSIS — F332 Major depressive disorder, recurrent severe without psychotic features: Secondary | ICD-10-CM | POA: Diagnosis present

## 2023-08-09 DIAGNOSIS — Z818 Family history of other mental and behavioral disorders: Secondary | ICD-10-CM | POA: Diagnosis not present

## 2023-08-09 DIAGNOSIS — Z91199 Patient's noncompliance with other medical treatment and regimen due to unspecified reason: Secondary | ICD-10-CM | POA: Diagnosis not present

## 2023-08-09 DIAGNOSIS — Z8619 Personal history of other infectious and parasitic diseases: Secondary | ICD-10-CM | POA: Diagnosis not present

## 2023-08-09 DIAGNOSIS — F1411 Cocaine abuse, in remission: Secondary | ICD-10-CM | POA: Diagnosis present

## 2023-08-09 DIAGNOSIS — Z79899 Other long term (current) drug therapy: Secondary | ICD-10-CM | POA: Diagnosis not present

## 2023-08-09 DIAGNOSIS — Z811 Family history of alcohol abuse and dependence: Secondary | ICD-10-CM

## 2023-08-09 DIAGNOSIS — G40909 Epilepsy, unspecified, not intractable, without status epilepticus: Secondary | ICD-10-CM | POA: Diagnosis present

## 2023-08-09 DIAGNOSIS — Z634 Disappearance and death of family member: Secondary | ICD-10-CM | POA: Diagnosis not present

## 2023-08-09 DIAGNOSIS — F17210 Nicotine dependence, cigarettes, uncomplicated: Secondary | ICD-10-CM | POA: Diagnosis present

## 2023-08-09 DIAGNOSIS — F4310 Post-traumatic stress disorder, unspecified: Secondary | ICD-10-CM | POA: Diagnosis present

## 2023-08-09 DIAGNOSIS — T43212D Poisoning by selective serotonin and norepinephrine reuptake inhibitors, intentional self-harm, subsequent encounter: Secondary | ICD-10-CM | POA: Diagnosis not present

## 2023-08-09 DIAGNOSIS — Z91010 Allergy to peanuts: Secondary | ICD-10-CM

## 2023-08-09 DIAGNOSIS — Z560 Unemployment, unspecified: Secondary | ICD-10-CM

## 2023-08-09 DIAGNOSIS — F1511 Other stimulant abuse, in remission: Secondary | ICD-10-CM | POA: Diagnosis present

## 2023-08-09 DIAGNOSIS — Z8659 Personal history of other mental and behavioral disorders: Secondary | ICD-10-CM | POA: Diagnosis not present

## 2023-08-09 DIAGNOSIS — T426X2D Poisoning by other antiepileptic and sedative-hypnotic drugs, intentional self-harm, subsequent encounter: Secondary | ICD-10-CM | POA: Diagnosis present

## 2023-08-09 MED ORDER — FLUOXETINE HCL 20 MG PO CAPS
40.0000 mg | ORAL_CAPSULE | Freq: Every day | ORAL | Status: DC
  Administered 2023-08-10 – 2023-08-12 (×3): 40 mg via ORAL
  Filled 2023-08-09 (×5): qty 2

## 2023-08-09 MED ORDER — RISPERIDONE 3 MG PO TABS
3.0000 mg | ORAL_TABLET | Freq: Every day | ORAL | Status: DC
  Administered 2023-08-09 – 2023-08-11 (×3): 3 mg via ORAL
  Filled 2023-08-09 (×6): qty 1

## 2023-08-09 MED ORDER — ALUM & MAG HYDROXIDE-SIMETH 200-200-20 MG/5ML PO SUSP: 30.0000 mL | ORAL | Status: DC | PRN

## 2023-08-09 MED ORDER — LORAZEPAM 1 MG PO TABS: 2.0000 mg | ORAL_TABLET | Freq: Four times a day (QID) | ORAL | Status: DC | PRN

## 2023-08-09 MED ORDER — DIPHENHYDRAMINE HCL 25 MG PO CAPS: 50.0000 mg | ORAL_CAPSULE | Freq: Four times a day (QID) | ORAL | Status: DC | PRN

## 2023-08-09 MED ORDER — HALOPERIDOL 5 MG PO TABS: 5.0000 mg | ORAL_TABLET | Freq: Three times a day (TID) | ORAL | Status: DC | PRN

## 2023-08-09 MED ORDER — LORAZEPAM 2 MG/ML IJ SOLN: 2.0000 mg | Freq: Three times a day (TID) | INTRAMUSCULAR | Status: DC | PRN

## 2023-08-09 MED ORDER — NICOTINE POLACRILEX 2 MG MT GUM
2.0000 mg | CHEWING_GUM | OROMUCOSAL | Status: DC | PRN
  Administered 2023-08-09 – 2023-08-18 (×10): 2 mg via ORAL
  Filled 2023-08-09 (×2): qty 1

## 2023-08-09 MED ORDER — DIPHENHYDRAMINE HCL 50 MG/ML IJ SOLN: 50.0000 mg | Freq: Three times a day (TID) | INTRAMUSCULAR | Status: DC | PRN

## 2023-08-09 MED ORDER — TRAZODONE HCL 150 MG PO TABS
150.0000 mg | ORAL_TABLET | Freq: Every day | ORAL | Status: DC
  Administered 2023-08-09 – 2023-08-17 (×9): 150 mg via ORAL
  Filled 2023-08-09 (×15): qty 1

## 2023-08-09 MED ORDER — HALOPERIDOL LACTATE 5 MG/ML IJ SOLN: 5.0000 mg | Freq: Three times a day (TID) | INTRAMUSCULAR | Status: DC | PRN

## 2023-08-09 MED ORDER — DIPHENHYDRAMINE HCL 25 MG PO CAPS: 50.0000 mg | ORAL_CAPSULE | Freq: Three times a day (TID) | ORAL | Status: DC | PRN

## 2023-08-09 MED ORDER — RISPERIDONE 2 MG PO TABS
2.0000 mg | ORAL_TABLET | Freq: Every day | ORAL | Status: DC
  Administered 2023-08-10 – 2023-08-12 (×3): 2 mg via ORAL
  Filled 2023-08-09 (×5): qty 1

## 2023-08-09 MED ORDER — OLANZAPINE 10 MG PO TBDP
10.0000 mg | ORAL_TABLET | Freq: Every day | ORAL | Status: DC | PRN
Start: 2023-08-09 — End: 2023-08-11

## 2023-08-09 MED ORDER — NICOTINE 14 MG/24HR TD PT24
14.0000 mg | MEDICATED_PATCH | Freq: Every day | TRANSDERMAL | Status: DC
  Administered 2023-08-09: 14 mg via TRANSDERMAL
  Filled 2023-08-09 (×2): qty 1

## 2023-08-09 MED ORDER — OLANZAPINE 10 MG IM SOLR: 10.0000 mg | Freq: Every day | INTRAMUSCULAR | Status: DC | PRN

## 2023-08-09 MED ORDER — DIPHENHYDRAMINE HCL 50 MG/ML IJ SOLN: 50.0000 mg | Freq: Four times a day (QID) | INTRAMUSCULAR | Status: DC | PRN

## 2023-08-09 MED ORDER — HALOPERIDOL LACTATE 5 MG/ML IJ SOLN: 10.0000 mg | Freq: Three times a day (TID) | INTRAMUSCULAR | Status: DC | PRN

## 2023-08-09 NOTE — ED Provider Notes (Signed)
 Emergency Medicine Observation Re-evaluation Note  Raymond [name] is a 43 y.o. male, seen on rounds today.  Pt initially presented to the ED for complaints of Suicidal Currently, the patient is resting.  Physical Exam  BP 111/75 (BP Location: Right Arm)   Pulse 70   Temp 98.1 F (36.7 C) (Oral)   Resp 16   Ht 6\' 1"  (1.854 m)   Wt 81.6 kg   SpO2 98%   BMI 23.75 kg/m  Physical Exam General: NAD   ED Course / MDM  EKG:EKG Interpretation Date/Time:  Saturday August 08 2023 19:49:03 EST Ventricular Rate:  70 PR Interval:  169 QRS Duration:  104 QT Interval:  366 QTC Calculation: 395 R Axis:   8  Text Interpretation: Sinus rhythm Consider left atrial enlargement RSR' in V1 or V2, right VCD or RVH No significant change since last tracing Confirmed by Melene Plan [phone]) on 08/08/2023 9:05:54 PM  I have reviewed the labs performed to date as well as medications administered while in observation.  Recent changes in the last 24 hours include no acute events reported.  Plan  Current plan is for psych placement.    Wynetta Fines, MD 08/09/23 [phone]

## 2023-08-09 NOTE — Plan of Care (Signed)

## 2023-08-09 NOTE — ED Notes (Signed)
 Report given to [facility]   they will advise once room is vacant and clean

## 2023-08-09 NOTE — Progress Notes (Signed)
   08/09/23 2200  Psych Admission Type (Psych Patients Only)  Admission Status Voluntary  Psychosocial Assessment  Patient Complaints Anxiety;Depression;Insomnia  Eye Contact Fair  Facial Expression Anxious  Affect Anxious  Speech Logical/coherent  Interaction Assertive  Motor Activity Fidgety  Appearance/Hygiene Body odor  Behavior Characteristics Cooperative;Appropriate to situation  Mood Depressed;Anxious  Thought Process  Coherency Circumstantial  Content Blaming self  Delusions None reported or observed  Perception WDL  Hallucination None reported or observed  Judgment Poor  Confusion None  Danger to Self  Current suicidal ideation? Denies  Description of Suicide Plan none  Agreement Not to Harm Self Yes  Description of Agreement verbal  Danger to Others  Danger to Others None reported or observed

## 2023-08-09 NOTE — [facility] Group Notes (Signed)
 [facility] Group Notes:  (Nursing/MHT/Case Management/Adjunct)  Date:  08/09/2023  Time:  8:50 PM  Type of Therapy:   Wrap Up Group  Participation Level:  Active  Participation Quality:  Appropriate  Affect:  Appropriate  Cognitive:  Appropriate and Oriented  Insight:  Appropriate and Good  Engagement in Group:  Engaged  Modes of Intervention:  Discussion and Support  Summary of Progress/Problems: Patient participated in group appropriately. Patient was able to identify positive qualities that they have. Patient was able to also identify challenges they have overcome.  Raymond [name] 08/09/2023, 8:50 PM

## 2023-08-09 NOTE — Group Note (Signed)
 Date:  08/09/2023 Time:  3:42 PM  Group Topic/Focus:  Rediscovering Joy:   The focus of this group is to explore various ways to relieve stress in a positive manner.    Participation Level:  Did Not Attend   Shela Nevin 08/09/2023, 3:42 PM

## 2023-08-09 NOTE — Plan of Care (Signed)

## 2023-08-09 NOTE — Progress Notes (Signed)
 [facility]/BMU LCSW Progress Note   08/09/2023    11:04 AM  [name]   [phone]   Type of Contact and Topic:  Psychiatric Bed Placement   Pt accepted to [facility]-1    Patient meets inpatient criteria per Caleen Jobs, NP   The attending provider will be Dr. Sarita Bottom   Call report to [phone]   Carleene Overlie, Paramedic @ [facility] ED notified.     Pt scheduled  to arrive at [facility] TODAY at 1300.    Damita Dunnings, MSW, LCSW-A  11:05 AM 08/09/2023

## 2023-08-09 NOTE — Progress Notes (Signed)
 Patient admitted, oriented to [facility], reviewed admission education with patient, he verbalized understanding.   08/09/23 1515  Psych Admission Type (Psych Patients Only)  Admission Status Voluntary  Psychosocial Assessment  Patient Complaints Anxiety;Decreased concentration;Depression;Hopelessness;Insomnia;Loneliness;Nervousness;Panic attack;Restlessness;Sadness;Self-harm thoughts;Worrying  Eye Contact Brief  Facial Expression Animated  Affect Anxious  Speech Press photographer  Appearance/Hygiene Body odor;Disheveled  Behavior Characteristics Cooperative;Appropriate to situation  Mood Anxious  Thought Process  Coherency Circumstantial  Content Blaming self  Delusions None reported or observed  Perception WDL  Hallucination None reported or observed  Judgment Limited  Confusion None  Danger to Self  Current suicidal ideation? Passive  Description of Suicide Plan no plan  Agreement Not to Harm Self Yes  Description of Agreement verbal  Danger to Others  Danger to Others None reported or observed

## 2023-08-10 ENCOUNTER — Encounter (HOSPITAL_COMMUNITY): Payer: Self-pay

## 2023-08-10 DIAGNOSIS — Z8659 Personal history of other mental and behavioral disorders: Secondary | ICD-10-CM | POA: Diagnosis not present

## 2023-08-10 DIAGNOSIS — F332 Major depressive disorder, recurrent severe without psychotic features: Secondary | ICD-10-CM | POA: Diagnosis not present

## 2023-08-10 DIAGNOSIS — F1090 Alcohol use, unspecified, uncomplicated: Secondary | ICD-10-CM | POA: Diagnosis not present

## 2023-08-10 DIAGNOSIS — R45851 Suicidal ideations: Secondary | ICD-10-CM

## 2023-08-10 MED ORDER — LISINOPRIL 10 MG PO TABS
10.0000 mg | ORAL_TABLET | Freq: Every day | ORAL | Status: DC
  Administered 2023-08-10 – 2023-08-17 (×8): 10 mg via ORAL
  Filled 2023-08-10 (×3): qty 1
  Filled 2023-08-10: qty 2
  Filled 2023-08-10: qty 1
  Filled 2023-08-10: qty 2
  Filled 2023-08-10 (×7): qty 1

## 2023-08-10 MED ORDER — LEVETIRACETAM 750 MG PO TABS
750.0000 mg | ORAL_TABLET | Freq: Two times a day (BID) | ORAL | Status: DC
  Administered 2023-08-10 – 2023-08-18 (×17): 750 mg via ORAL
  Filled 2023-08-10 (×21): qty 1

## 2023-08-10 NOTE — [facility] Suicide Risk Assessment (Signed)
 Suicide Risk Assessment  Admission Assessment    [facility] Admission Suicide Risk Assessment   Nursing information obtained from:  Patient Demographic factors:  Unemployed Current Mental Status:  Suicidal ideation indicated by patient Loss Factors:  Financial problems / change in socioeconomic status, Loss of significant relationship Historical Factors:  Prior suicide attempts, Impulsivity Risk Reduction Factors:  NA  Total Time spent with patient: 45 minutes Principal Problem: <principal problem not specified> Diagnosis:  Active Problems:   Major depressive disorder, recurrent severe without psychotic features (HCC)  Subjective Data: Raymond [name] is a 43 year old Caucasian male with prior psychiatric history of anxiety, panic attacks, depression, PTSD, who presents voluntarily to [facility] from [facility] ED after stabilization for worsening depression, anxiety, panic attack, resulting in suicidal ideation by intentional overdosing on 90 pills of 500 mg Keppra and trazodone.   Continued Clinical Symptoms:  Alcohol Use Disorder Identification Test Final Score (AUDIT): 0 The "Alcohol Use Disorders Identification Test", Guidelines for Use in Primary Care, Second Edition.  World Science writer [facility]). Score between 0-7:  no or low risk or alcohol related problems. Score between 8-15:  moderate risk of alcohol related problems. Score between 16-19:  high risk of alcohol related problems. Score 20 or above:  warrants further diagnostic evaluation for alcohol dependence and treatment.  CLINICAL FACTORS:   Severe Anxiety and/or Agitation Panic Attacks Depression:   Anhedonia Impulsivity Severe Alcohol/Substance Abuse/Dependencies Epilepsy More than one psychiatric diagnosis Previous Psychiatric Diagnoses and Treatments Medical Diagnoses and Treatments/Surgeries  Musculoskeletal: Strength & Muscle Tone: within normal limits Gait & Station:  normal Patient leans: N/A  Psychiatric Specialty Exam:  Presentation  General Appearance:  Disheveled  Eye Contact: Good  Speech: Clear and Coherent  Speech Volume: Normal  Handedness: Right  Mood and Affect  Mood: Depressed; Anxious  Affect: Congruent  Thought Process  Thought Processes: Coherent  Descriptions of Associations:Intact  Orientation:Full (Time, Place and Person)  Thought Content:Logical  History of Schizophrenia/Schizoaffective disorder:No  Duration of Psychotic Symptoms:No data recorded Hallucinations:Hallucinations: Visual Description of Visual Hallucinations: Dark shadows  Ideas of Reference:None  Suicidal Thoughts:Suicidal Thoughts: No  Homicidal Thoughts:Homicidal Thoughts: No  Sensorium  Memory: Immediate Good; Recent Good  Judgment: Poor  Insight: Poor   Executive Functions  Concentration: Good  Attention Span: Good  Recall: Good  Fund of Knowledge: Fair  Language: Good  Psychomotor Activity  Psychomotor Activity: Psychomotor Activity: Normal  Assets  Assets: Communication Skills; Resilience; Physical Health  Sleep  Sleep: Sleep: Fair Number of Hours of Sleep: 4  Physical Exam: Physical Exam Vitals and nursing note reviewed.  Constitutional:      General: He is not in acute distress.    Appearance: He is not ill-appearing or toxic-appearing.  HENT:     Head: Normocephalic.     Right Ear: External ear normal.     Left Ear: External ear normal.     Nose: Nose normal.     Mouth/Throat:     Mouth: Mucous membranes are moist.     Pharynx: Oropharynx is clear.  Eyes:     General:        Right eye: No discharge.     Extraocular Movements: Extraocular movements intact.     Pupils: Pupils are equal, round, and reactive to light.  Cardiovascular:     Rate and Rhythm: Normal rate.     Pulses: Normal pulses.  Pulmonary:     Effort: Pulmonary effort is normal.  Abdominal:  Comments: Deferred   Genitourinary:    Comments: Deferred Musculoskeletal:        General: Normal range of motion.     Cervical back: Normal range of motion.  Skin:    General: Skin is warm.  Neurological:     General: No focal deficit present.     Mental Status: He is alert and oriented to person, place, and time.  Psychiatric:        Mood and Affect: Mood normal.        Behavior: Behavior normal.        Thought Content: Thought content normal.    Review of Systems  Constitutional:  Negative for chills and fever.  HENT:  Negative for sore throat.   Eyes:  Negative for blurred vision.  Respiratory:  Negative for cough, sputum production, shortness of breath and wheezing.   Cardiovascular:  Negative for chest pain and palpitations.  Gastrointestinal:  Negative for abdominal pain, constipation, diarrhea, heartburn, nausea and vomiting.  Genitourinary:  Negative for dysuria, frequency and urgency.  Musculoskeletal:  Negative for myalgias and neck pain.  Skin:  Negative for itching and rash.  Neurological:  Positive for seizures (History of seizures). Negative for dizziness, tingling and headaches.  Endo/Heme/Allergies:        See allergy listing  Psychiatric/Behavioral:  Positive for depression. Negative for hallucinations and suicidal ideas (History of seizures). The patient is nervous/anxious and has insomnia.    Blood pressure 117/86, pulse 90, temperature (!) 97.3 F (36.3 C), temperature source Oral, resp. rate 20, height 6\' 1"  (1.854 m), weight 77.2 kg, SpO2 97%. Body mass index is 22.46 kg/m.  COGNITIVE FEATURES THAT CONTRIBUTE TO RISK:  Polarized thinking    SUICIDE RISK:   Severe:  Frequent, intense, and enduring suicidal ideation, specific plan, no subjective intent, but some objective markers of intent (i.e., choice of lethal method), the method is accessible, some limited preparatory behavior, evidence of impaired self-control, severe dysphoria/symptomatology, multiple risk factors  present, and few if any protective factors, particularly a lack of social support.  PLAN OF CARE: Daily contact with patient to assess and evaluate symptoms and progress in treatment and Medication management  Physician Treatment Plan for Primary Diagnosis: Assessment: Raymond Littler is a 43 year old Caucasian male with prior psychiatric history of anxiety, panic attacks, depression, PTSD, who presents voluntarily to [facility] from [facility] ED after stabilization for worsening depression, anxiety, panic attack, resulting in suicidal ideation by intentional overdosing on 90 pills of 500 mg Keppra and trazodone.    Active Problems:   Major depressive disorder, recurrent severe without psychotic features (HCC) Generalized anxiety disorder PTSD Seizure disorder  Plans: Medications: -- Continue Prozac capsule 40 mg p.o. daily for depression and anxiety --Continue him Risperdal tablet 2 mg p.o. daily for psychosis --Continue Risperdal tablet 3 mg p.o. daily at bedtime for psychosis --Continue trazodone 150 mg p.o. daily at bedtime for insomnia  Medications for other medical problems: -- Continue Keppra tablet 750 mg p.o. twice daily to prevent seizures -- Nicotine gum 2 mg p.o. at needed for smoking cessation -- Lorazepam tablet 2 mg every 6 hours as needed for anxiety, seizures -- Lisinopril Tablet 10 mg p.o. daily for high blood pressure  Continue BH Agitation Protocol --Haldol 5 mg, oral, 3 times daily as needed, mild agitation --Benadryl 50 mg, oral, 3 times daily as needed, mild agitation  OR  --Haldol injection 5 mg, IM, 3 times daily as needed, moderate agitation --Benadryl injection 50 mg, IM, 3 times daily as needed, moderate agitation --Ativan injection 2 mg, IM, 3 times daily as needed, moderate agitation                                     OR --Haldol injection 10 mg, IM, 3 times daily as needed, severe  agitation --Benadryl injection 50 mg, IM, 3 times daily as needed, severe agitation --Ativan injection 2 mg, IM, 3 times daily as needed, severe agitation   --  The risks/benefits/side-effects/alternatives to this medication were discussed in detail with the patient and time was given for questions. The patient consents to medication trial.  -- FDA             -- Metabolic profile and EKG monitoring obtained while on an atypical antipsychotic (BMI: Lipid Panel: HbgA1c: QTc:)              -- Encouraged patient to participate in unit milieu and in scheduled group therapies   Other PRN Medications -Acetaminophen 650 mg every 6 as needed/mild pain -Maalox 30 mL oral every 4 as needed/digestion -Magnesium hydroxide 30 mL daily as needed/mild constipation  --The risks/benefits/side-effects/alternatives to this medication were discussed in detail with the patient and time was given for questions. The patient consents to medication trial.  -- Metabolic profile and EKG monitoring obtained while on an atypical antipsychotic (BMI: Lipid Panel: HbgA1c: QTc:)  -- Encouraged patient to participate in unit milieu and in scheduled group therapies   Admission labs reviewed: CMP: CO2 17 low, glucose 111 high, otherwise normal.  CBC: Within normal limits.  BAL: Less than 10.  UDS: Positive for marijuana  New labs ordered: TSH, lipid panel, hemoglobin A1c, vitamin D 25-hydroxy  EKG reviewed: Sinus rhythm, ventricular rate 70, QT/QTc at bedtime 366/395   Safety and Monitoring: Voluntary admission to inpatient psychiatric unit for safety, stabilization and treatment Daily contact with patient to assess and evaluate symptoms and progress in treatment Patient's case to be discussed in multi-disciplinary team meeting Observation Level : q15 minute checks Vital signs: q12 hours Precautions: suicide, but pt currently verbally contracts for safety on unit    Discharge Planning: Social work and case management  to assist with discharge planning and identification of hospital follow-up needs prior to discharge Estimated LOS: 5-7 days Discharge Concerns: Need to establish a safety plan; Medication compliance and effectiveness Discharge Goals: Return home with outpatient referrals for mental health follow-up including medication management/psychotherapy.   -- Long Term Goals: Improvement in symptoms so as ready for discharge -- Short Term Goals: Ability to identify changes in lifestyle to reduce recurrence of condition will improve, Ability to verbalize feelings will improve, Ability to demonstrate self-control will improve, Ability to identify and develop effective coping behaviors will improve, Ability to maintain clinical measurements within normal limits will improve, Compliance with prescribed medications will improve, and Ability to identify triggers associated with substance abuse/mental health issues will improve Physician Treatment Plan for Secondary Diagnosis:  I certify that inpatient services furnished can reasonably be expected to improve the patient's condition.   Cecilie Lowers, FNP 08/10/2023, 5:14 PM

## 2023-08-10 NOTE — BH IP Treatment Plan (Signed)
 Interdisciplinary Treatment and Diagnostic Plan Update  08/10/2023 Time of Session: 10:32AM Raymond [name] MRN: [id number]  Principal Diagnosis: <principal problem not specified>  Secondary Diagnoses: Active Problems:   Major depressive disorder, recurrent severe without psychotic features (HCC)   Current Medications:  Current Facility-Administered Medications  Medication Dose Route Frequency Provider Last Rate Last Admin   alum & mag hydroxide-simeth (MAALOX/MYLANTA) 200-200-20 MG/5ML suspension 30 mL  30 mL Oral Q4H PRN Onuoha, Josephine C, NP       diphenhydrAMINE (BENADRYL) capsule 50 mg  50 mg Oral Q6H PRN Welford Roche, Josephine C, NP       Or   diphenhydrAMINE (BENADRYL) injection 50 mg  50 mg Intramuscular Q6H PRN Onuoha, Josephine C, NP       haloperidol (HALDOL) tablet 5 mg  5 mg Oral TID PRN Dahlia Byes C, NP       And   diphenhydrAMINE (BENADRYL) capsule 50 mg  50 mg Oral TID PRN Dahlia Byes C, NP       haloperidol lactate (HALDOL) injection 5 mg  5 mg Intramuscular TID PRN Dahlia Byes C, NP       And   diphenhydrAMINE (BENADRYL) injection 50 mg  50 mg Intramuscular TID PRN Dahlia Byes C, NP       And   LORazepam (ATIVAN) injection 2 mg  2 mg Intramuscular TID PRN Dahlia Byes C, NP       haloperidol lactate (HALDOL) injection 10 mg  10 mg Intramuscular TID PRN Dahlia Byes C, NP       And   diphenhydrAMINE (BENADRYL) injection 50 mg  50 mg Intramuscular TID PRN Dahlia Byes C, NP       And   LORazepam (ATIVAN) injection 2 mg  2 mg Intramuscular TID PRN Earney Navy, NP       FLUoxetine (PROZAC) capsule 40 mg  40 mg Oral Daily Dahlia Byes C, NP   40 mg at 08/10/23 0808   levETIRAcetam (KEPPRA) tablet 750 mg  750 mg Oral BID Bobbitt, Shalon E, NP   750 mg at 08/10/23 0808   lisinopril (ZESTRIL) tablet 10 mg  10 mg Oral Daily Bobbitt, Shalon E, NP   10 mg at 08/10/23 0654   LORazepam (ATIVAN) tablet 2 mg  2 mg Oral Q6H PRN Dahlia Byes C, NP       nicotine polacrilex (NICORETTE) gum 2 mg  2 mg Oral PRN Abbott Pao, Nadir, MD   2 mg at 08/09/23 2136   OLANZapine (ZYPREXA) injection 10 mg  10 mg Intramuscular Daily PRN Dahlia Byes C, NP       OLANZapine zydis (ZYPREXA) disintegrating tablet 10 mg  10 mg Oral Daily PRN Dahlia Byes C, NP       risperiDONE (RISPERDAL) tablet 2 mg  2 mg Oral Daily Onuoha, Josephine C, NP   2 mg at 08/10/23 1027   risperiDONE (RISPERDAL) tablet 3 mg  3 mg Oral QHS Onuoha, Josephine C, NP   3 mg at 08/09/23 2135   traZODone (DESYREL) tablet 150 mg  150 mg Oral QHS Dahlia Byes C, NP   150 mg at 08/09/23 2135   PTA Medications: Medications Prior to Admission  Medication Sig Dispense Refill Last Dose/Taking   FLUoxetine (PROZAC) 40 MG capsule Take 1 capsule (40 mg total) by mouth daily. 30 capsule 0 Past Month   gabapentin (NEURONTIN) 100 MG capsule Take 1 capsule (100 mg total) by mouth 3 (three) times daily. 90 capsule 1 Past Month  hydrOXYzine (ATARAX) 50 MG tablet Take 1 tablet (50 mg total) by mouth every 6 (six) hours as needed for anxiety. 10 tablet 0 Past Month   levETIRAcetam (KEPPRA) 750 MG tablet Take 1 tablet (750 mg total) by mouth every 12 (twelve) hours. 60 tablet 0 Past Month   Tiotropium Bromide-Olodaterol 2.5-2.5 MCG/ACT AERS Inhale 2 puffs into the lungs daily.   Past Month   albuterol (VENTOLIN HFA) 108 (90 Base) MCG/ACT inhaler Inhale 2 puffs into the lungs every 4 (four) hours as needed for wheezing or shortness of breath. 6.7 g 0    lisinopril (ZESTRIL) 10 MG tablet Take 1 tablet (10 mg total) by mouth daily. 10 tablet 0    risperiDONE (RISPERDAL) 2 MG tablet Take 1 tablet (2 mg total) by mouth at bedtime. (Patient taking differently: Take 3 mg by mouth at bedtime.) 60 tablet 0    traZODone (DESYREL) 150 MG tablet Take 150 mg by mouth at bedtime as needed for sleep.       Patient Stressors:    Patient Strengths:    Treatment Modalities: Medication  Management, Group therapy, Case management,  1 to 1 session with clinician, Psychoeducation, Recreational therapy.   Physician Treatment Plan for Primary Diagnosis: <principal problem not specified> Long Term Goal(s):     Short Term Goals:    Medication Management: Evaluate patient's response, side effects, and tolerance of medication regimen.  Therapeutic Interventions: 1 to 1 sessions, Unit Group sessions and Medication administration.  Evaluation of Outcomes: Not Progressing  Physician Treatment Plan for Secondary Diagnosis: Active Problems:   Major depressive disorder, recurrent severe without psychotic features (HCC)  Long Term Goal(s):     Short Term Goals:       Medication Management: Evaluate patient's response, side effects, and tolerance of medication regimen.  Therapeutic Interventions: 1 to 1 sessions, Unit Group sessions and Medication administration.  Evaluation of Outcomes: Not Progressing   RN Treatment Plan for Primary Diagnosis: <principal problem not specified> Long Term Goal(s): Knowledge of disease and therapeutic regimen to maintain health will improve  Short Term Goals: Ability to remain free from injury will improve, Ability to demonstrate self-control, Ability to verbalize feelings will improve, Ability to disclose and discuss suicidal ideas, and Ability to identify and develop effective coping behaviors will improve  Medication Management: RN will administer medications as ordered by provider, will assess and evaluate patient's response and provide education to patient for prescribed medication. RN will report any adverse and/or side effects to prescribing provider.  Therapeutic Interventions: 1 on 1 counseling sessions, Psychoeducation, Medication administration, Evaluate responses to treatment, Monitor vital signs and CBGs as ordered, Perform/monitor CIWA, COWS, AIMS and Fall Risk screenings as ordered, Perform wound care treatments as  ordered.  Evaluation of Outcomes: Not Progressing   LCSW Treatment Plan for Primary Diagnosis: <principal problem not specified> Long Term Goal(s): Safe transition to appropriate next level of care at discharge, Engage patient in therapeutic group addressing interpersonal concerns.  Short Term Goals: Engage patient in aftercare planning with referrals and resources, Increase ability to appropriately verbalize feelings, Increase emotional regulation, Facilitate patient progression through stages of change regarding substance use diagnoses and concerns, Identify triggers associated with mental health/substance abuse issues, and Increase skills for wellness and recovery  Therapeutic Interventions: Assess for all discharge needs, 1 to 1 time with Social worker, Explore available resources and support systems, Assess for adequacy in community support network, Educate family and significant other(s) on suicide prevention, Complete Psychosocial Assessment, Interpersonal group therapy.  Evaluation of Outcomes: Not Progressing   Progress in Treatment: Attending groups: Yes. Participating in groups: Yes. Taking medication as prescribed: Yes. Toleration medication: Yes. Family/Significant other contact made: No, will contact:  CSW assessment pending Patient understands diagnosis: Yes. Discussing patient identified problems/goals with staff: Yes. Medical problems stabilized or resolved: Yes. Denies suicidal/homicidal ideation: Yes. Issues/concerns per patient self-inventory: No. Other: N/a  New problem(s) identified: No, Describe:  None  New Short Term/Long Term Goal(s): detox, medication management for mood stabilization; elimination of SI thoughts; development of comprehensive mental wellness/sobriety plan  Patient Goals:  "I'm trying to better myself"  Discharge Plan or Barriers: Patient recently admitted. CSW will continue to follow and assess for appropriate referrals and possible  discharge planning.   Reason for Continuation of Hospitalization: Anxiety Depression Medication stabilization Other; describe Mood stabilization, discharge planning  Estimated Length of Stay: 3-5 DAYS  Last 3 [place] Suicide Severity Risk Score: Flowsheet Row [facility] (Current[facility] 08[facility]08/2023 in [facility] ED from 06/27/2023 in [facility] Department [facility]  C-SSRS RISK CATEGORY High Risk High Risk No Risk       Last [facility] 2/9 Scores:     No data to display          Scribe for Treatment Team: Jacinta Shoe, LCSW 08/10/2023 2:40 PM

## 2023-08-10 NOTE — Group Note (Signed)
 Recreation Therapy Group Note   Group Topic:Problem Solving  Group Date: 08/10/2023 Start Time: 0935 End Time: 1000 Facilitators: Kura Bethards-McCall, LRT,CTRS Location: [address] Dayroom   Group Topic: Communication, Team Building, Problem Solving   Goal Area(s) Addresses:  Patient will effectively work with peer towards shared goal.  Patient will identify skills used to make activity successful.  Patient will identify how skills used during activity can be used to reach post d/c goals.    Intervention: STEM Activity   Activity: Straw Bridge. In teams of 3-5, patients were given 15 plastic drinking straws and an equal length of masking tape. Using the materials provided, patients were instructed to build a free standing bridge-like structure to suspend an everyday item (ex: puzzle box) off of the floor or table surface. All materials were required to be used by the team in their design. LRT facilitated post-activity discussion reviewing team process. Patients were encouraged to reflect how the skills used in this activity can be generalized to daily life post discharge.    Education: Pharmacist, community, Scientist, physiological, Discharge Planning    Education Outcome: Acknowledges education/In group clarification offered/Needs additional education.   Affect/Mood: Appropriate   Participation Level: Engaged   Participation Quality: Independent   Behavior: Appropriate   Speech/Thought Process: Focused   Insight: Good   Judgement: Good   Modes of Intervention: STEM Activity   Patient Response to Interventions:  Engaged   Education Outcome:  In group clarification offered    Clinical Observations/Individualized Feedback: Pt was bright and interactive with peers. Pt was able to successfully complete the activity with peers. Pt was appropriate during group session.     Plan: Continue to engage patient in RT group sessions 2-3x/week.   Lekeshia Kram-McCall, LRT,CTRS  08/10/2023  12:53 PM

## 2023-08-10 NOTE — Plan of Care (Signed)

## 2023-08-10 NOTE — Group Note (Signed)
 Date:  08/10/2023 Time:  6:25 PM  Group Topic/Focus:  Building Self Esteem:   The Focus of this group is helping patients become aware of the effects of self-esteem on their lives, the things they and others do that enhance or undermine their self-esteem, seeing the relationship between their level of self-esteem and the choices they make and learning ways to enhance self-esteem. Emotional Education:   The focus of this group is to discuss what feelings/emotions are, and how they are experienced.    Participation Level:  Active  Participation Quality:  Appropriate  Affect:  Appropriate  Cognitive:  Appropriate  Insight: Appropriate  Engagement in Group:  Engaged  Modes of Intervention:  Activity  Additional Comments:   Pt attended and actively participated in the Emotional Wellness group.  Edmund Hilda Maisie Hauser 08/10/2023, 6:25 PM

## 2023-08-10 NOTE — Group Note (Signed)
 Date:  08/10/2023 Time:  4:43 PM  Group Topic/Focus:  Goals Group:   The focus of this group is to help patients establish daily goals to achieve during treatment and discuss how the patient can incorporate goal setting into their daily lives to aide in recovery. Orientation:   The focus of this group is to educate the patient on the purpose and policies of crisis stabilization and provide a format to answer questions about their admission.  The group details unit policies and expectations of patients while admitted.    Participation Level:  Active  Participation Quality:  Appropriate  Affect:  Appropriate  Cognitive:  Appropriate  Insight: Appropriate  Engagement in Group:  Engaged  Modes of Intervention:  Discussion and Orientation  Additional Comments:   Pt actively participated in the Orientation and Goals group.   Edmund Hilda Shaunika Italiano 08/10/2023, 4:43 PM

## 2023-08-10 NOTE — H&P (Signed)
 Psychiatric Admission Assessment Adult  Patient Identification: Raymond [name] MRN:  [id number] Date of Evaluation:  08/10/2023 Chief Complaint:  Major depressive disorder, recurrent severe without psychotic features (HCC) [F33.2] Principal Diagnosis: <principal problem not specified> Diagnosis:  Active Problems:   Major depressive disorder, recurrent severe without psychotic features (HCC) PTSD, hx Alcohol Use Disorder Suicidal Gesture with intentional ingestion of medication  CC: "I took 90 Keppra and trazodone to try to kill myself"  History of Present Illness: Raymond [name] is a 43 year old Caucasian male with prior psychiatric history of anxiety, panic attacks, depression, PTSD, who presents voluntarily to [facility] from [facility] ED after stabilization for worsening depression, anxiety, panic attack, resulting in suicidal ideation by intentional overdosing on 90 pills of 500 mg Keppra and trazodone.  After medical evaluation/stabilization & clearance, he was transferred to the [facility] for further psychiatric evaluation & treatments.   During the evaluation, patient reports being overwhelmed with multiple stressors to include his mother dying 2-1/2 years ago, he lost 2 babies by miscarriages 2 years ago, having seizures activities x 4 in 2 days due to taking 90 of his Keppra in 2 days, medication not working, and homelessness.  Chart review indicates patient was last admitted, treated, and discharged from [facility] inpatient psychiatric hospital on 10/29/2022.  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  Patient appears to be inconsistent with providing HPI to this provider, probably due to history of traumatic brain injury as noted in chart review and reported by patient.  Patient reports symptoms of depression to include loneliness,  sadness, insomnia, decreased appetite.  He reports symptoms of anxiety to include panic attacks.  He denies symptoms of PTSD, mania, OCD, or psychosis at this time.  Objective:  Patient is seen and examined on the unit sitting up in a chair. He is alert, oriented to person, place, time, and situation. Chart reviewed and findings shared with the treatment team and discussed with the attending psychiatrist.  Of note, patient is noncompliant with his psychotropic medications as noted in chart review.  Mood appears pleasant with congruent affect.  Speech is clear with normal volume and pattern.  Attention to hygiene is poor and encouraged patient to take a shower today with change of clothing.  Thought process and thought content appears logical however, with inconsistencies.  He does not appear to be preoccupied or responding to internal stimuli.  No delusional thinking or paranoia observed during the examination.  Raymond [name] denies SI, HI, or AH.  He is able to fully contract for safety at this hospitalization.  He endorses visual hallucination of seeing dark shadows.  Vital signs reviewed without critical values.  Admission labs and EKG reviewed as indicated under the treatment plan.  Patient is admitted for mood stabilization, medication management, and safety.   Mode of transport to Hospital: Safe transport Current Outpatient (Home) Medication List: See home medication listing PRN medication prior to evaluation: See home medication listing  ED course: Labs and EKG were obtained and analyzed Collateral Information: No collateral information POA/Legal Guardian: Patient is his own legal guardian  Past Psychiatric Hx: Previous Psych Diagnoses: Major depressive disorder, recurrent severe without psychotic features, amphetamine abuse, cocaine abuse, substance-induced mood disorder, with suicide attempt.  Prior inpatient treatment: Admitted to [facility] behavioral health in [place] in 2020 for  suicide attempt for 1 month.  Current/prior outpatient treatment:  None Prior rehab hx: Rehab for alcohol x 1 month in 2019 Psychotherapy hx: Yes History of suicide: X multiple attempts History of homicide or aggression: Denies Psychiatric medication history: Thorazine, fluoxetine, hydroxyzine, risperidone, and trazodone. Psychiatric medication compliance history: Noncompliance Neuromodulation history: Denies  Current Psychiatrist: Denies Current therapist: Seeing Graciella Freer at the [place]  Substance Abuse Hx: Alcohol: 3 beers daily of 40 ounces Tobacco: 1 pack of cigarette daily Illicit drugs: Smokes 1 blunts of marijuana weekly Rx drug abuse: Cocaine and meth use in the past quit drugs use in 2015 Rehab hx: Attended rehab for drug and alcohol use in 2019  Past Medical History:  Medical Diagnoses: Seizures activity, lung tumor, high blood pressure Home Rx: Yes Prior Hosp: Yes, in 2019, 2020, and 2024 Prior Surgeries/Trauma: Facial surgery in 2020 Head trauma, LOC, concussions, seizures: History of conclusion while in the [employer].  History of seizures Allergies: Other   Itching, Rash, Other (See Comments) Low   11/13/2021  Peanuts  Tylenol acetaminophen   Rash Low   10/29/2022    LMP: Not applicable Contraception: Not applicable PCP: Denies  Family History: Medical: Medical history of heart disease Psych: Mother has history of depression.  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   Associated Signs/Symptoms: Depression Symptoms:  depressed mood, anhedonia, insomnia, fatigue, feelings of worthlessness/guilt, difficulty concentrating, hopelessness, anxiety, panic attacks, loss of energy/fatigue, disturbed sleep,   (Hypo) Manic Symptoms:  Delusions, Distractibility, Impulsivity, Irritable Mood,   Anxiety Symptoms:  Excessive Worry, Panic Symptoms,   Psychotic Symptoms:  Delusions, Paranoia,   PTSD Symptoms: Had a traumatic exposure:  Patient served in the [employer] from 2001-2004 Re-experiencing:  Flashbacks Intrusive Thoughts Hypervigilance:  Yes Avoidance:  Foreshortened Future  Total Time spent with patient: 1.5 hours  Is the patient at risk to [name]? Yes.    Has the patient been a risk to [name] in the past 6 months? Yes.    Has the patient been a risk to [name] within the distant past? Yes.    Is the patient a risk to others? No.  Has the patient been a risk to others in the past 6 months? No.  Has the patient been a risk to others within the distant past? No.    [place] Scale:  Flowsheet Row [facility] ([facility]) [facility] from 08/08/2023 in [facility] Department at [facility] ED from 06/27/2023 in [facility] Emergency Department at [facility]  C-SSRS RISK CATEGORY High Risk High Risk No Risk      Alcohol Screening: 1. How often do you have a drink containing alcohol?: Never 2. How many drinks containing alcohol do you have on a typical day when you are drinking?: 1 or 2 3. How often do you have six or more drinks on one occasion?: Never AUDIT-C Score: 0 4. How often during the last year have you found that you were not able to stop drinking once you had started?: Never 5. How often during the last year have you failed to do what  was normally expected from you because of drinking?: Never 6. How often during the last year have you needed a first drink  in the morning to get yourself going after a heavy drinking session?: Never 7. How often during the last year have you had a feeling of guilt of remorse after drinking?: Never 8. How often during the last year have you been unable to remember what happened the night before because you had been drinking?: Never 9. Have you or someone else been injured as a result of your drinking?: No 10. Has a relative or friend or a doctor or another health worker been concerned about your drinking or suggested you cut down?: No Alcohol Use Disorder Identification Test Final Score (AUDIT): 0 Alcohol Brief Interventions/Follow-up: Alcohol education/Brief advice  Substance Abuse History in the last 12 months:  Yes.    Consequences of Substance Abuse: Discussed with patient during this admission evaluation. Medical Consequences:  Liver damage, Possible death by overdose Legal Consequences:  Arrests, jail time, Loss of driving privilege. Family Consequences:  Family discord, divorce and or separation.  Previous Psychotropic Medications: Yes  Psychological Evaluations: Yes  Past Medical History:  Past Medical History:  Diagnosis Date   Alcohol abuse    Anxiety    Hepatitis C    PTSD (post-traumatic stress disorder)     Past Surgical History:  Procedure Laterality Date   FACIAL FRACTURE SURGERY     metal plate under right eye   Family History: History reviewed. No pertinent family history.  Tobacco Screening:  Social History   Tobacco Use  Smoking Status Every Day   Current packs/day: 1.00   Types: Cigarettes   Passive exposure: Current  Smokeless Tobacco Never    BH Tobacco Counseling     Are you interested in Tobacco Cessation Medications?  Yes, implement Nicotene Replacement Protocol Counseled patient on smoking cessation:  Refused/Declined practical counseling Reason Tobacco Screening Not Completed: No value filed.    Social History:  Social History   Substance and Sexual Activity   Alcohol Use Yes   Comment: 1 liter bottle of vodka prior to arrival     Social History   Substance and Sexual Activity  Drug Use Yes   Types: Methamphetamines    Additional Social History:   Allergies:   Allergies  Allergen Reactions   Peanut Butter Flavor [Flavoring Agent]    Other Itching, Rash and Other (See Comments)    Peanuts   Tylenol [Acetaminophen] Rash   Lab Results:  Results for orders placed or performed during the hospital encounter of 08/08/23 (from the past 48 hours)  Comprehensive metabolic panel     Status: Abnormal   Collection Time: 08/08/23  4:46 PM  Result Value Ref Range   Sodium 138 135 - 145 mmol/L   Potassium 3.7 3.5 - 5.1 mmol/L   Chloride 107 98 - 111 mmol/L   CO2 17 (L) 22 - 32 mmol/L   Glucose, Bld 111 (H) 70 - 99 mg/dL    Comment: Glucose reference range applies only to samples taken after fasting for at least 8 hours.   BUN 12 6 - 20 mg/dL   Creatinine, Ser 1.61 0.61 - 1.24 mg/dL   Calcium 8.9 8.9 - 09.6 mg/dL   Total Protein 7.7 6.5 - 8.1 g/dL   Albumin 4.3 3.5 - 5.0 g/dL   AST 32 15 - 41 U/L   ALT 40 0 - 44 U/L   Alkaline Phosphatase 65 38 - 126 U/L  Total Bilirubin 0.6 0.0 - 1.2 mg/dL   GFR, Estimated >16 >10 mL/min    Comment: (NOTE) Calculated using the CKD-EPI Creatinine Equation (2021)    Anion gap 14 5 - 15    Comment: Performed at [facility], [address], [place], [place] [address]  Ethanol     Status: None   Collection Time: 08/08/23  4:46 PM  Result Value Ref Range   Alcohol, Ethyl (B) <10 <10 mg/dL    Comment: (NOTE) Lowest detectable limit for serum alcohol is 10 mg/dL.  For medical purposes only. Performed at [facility], [address], [place], [place] [address]   Salicylate level     Status: Abnormal   Collection Time: 08/08/23  4:46 PM  Result Value Ref Range   Salicylate Lvl <7.0 (L) 7.0 - 30.0 mg/dL    Comment: Performed at [facility], [address] [place], [place] [address]  Acetaminophen level     Status: Abnormal   Collection Time: 08/08/23  4:46 PM  Result Value Ref Range   Acetaminophen (Tylenol), Serum <10 (L) 10 - 30 ug/mL    Comment: (NOTE) Therapeutic concentrations vary significantly. A range of 10-30 ug/mL  may be an effective concentration for many patients. However, some  are best treated at concentrations outside of this range. Acetaminophen concentrations >150 ug/mL at 4 hours after ingestion  and >50 ug/mL at 12 hours after ingestion are often associated with  toxic reactions.  Performed at [facility], [address], [place], [place] [address]   cbc     Status: None   Collection Time: 08/08/23  4:46 PM  Result Value Ref Range   WBC 7.2 4.0 - 10.5 K/uL   RBC 5.04 4.22 - 5.81 MIL/uL   Hemoglobin 16.0 13.0 - 17.0 g/dL   HCT 21.3 08.6 - 57.8 %   MCV 97.0 80.0 - 100.0 fL   MCH 31.7 26.0 - 34.0 pg   MCHC 32.7 30.0 - 36.0 g/dL   RDW 46.9 62.9 - 52.8 %   Platelets 264 150 - 400 K/uL   nRBC 0.0 0.0 - 0.2 %    Comment: Performed at [facility], [address], [place], [place] [address]  Rapid urine drug screen (hospital performed)     Status: Abnormal   Collection Time: 08/08/23  8:50 PM  Result Value Ref Range   Opiates NONE DETECTED NONE DETECTED   Cocaine NONE DETECTED NONE DETECTED   Benzodiazepines NONE DETECTED NONE DETECTED   Amphetamines NONE DETECTED NONE DETECTED   Tetrahydrocannabinol POSITIVE (A) NONE DETECTED   Barbiturates NONE DETECTED NONE DETECTED    Comment: (NOTE) DRUG SCREEN FOR MEDICAL PURPOSES ONLY.  IF CONFIRMATION IS NEEDED FOR ANY PURPOSE, NOTIFY LAB WITHIN 5 DAYS.  LOWEST DETECTABLE LIMITS FOR URINE DRUG SCREEN Drug Class                     Cutoff (ng/mL) Amphetamine and metabolites    1000 Barbiturate and metabolites    200 Benzodiazepine                 200 Opiates and metabolites        300 Cocaine and metabolites         300 THC                            50 Performed at  [facility], [address], [place], [place] [address]     Blood Alcohol level:  Lab Results  Component Value Date   ETH <10 08/08/2023   ETH 297 (H) 06/27/2023    Metabolic Disorder Labs:  Lab Results  Component Value Date   HGBA1C 5.4 03/28/2023   MPG 108.28 03/28/2023   MPG 111.15 01/26/2023   Lab Results  Component Value Date   PROLACTIN 12.0 01/22/2023   PROLACTIN 19.5 11/20/2022   Lab Results  Component Value Date   CHOL 198 03/28/2023   TRIG 225 (H) 03/28/2023   HDL 69 03/28/2023   CHOLHDL 2.9 03/28/2023   VLDL 45 (H) 03/28/2023   LDLCALC 84 03/28/2023   LDLCALC 56 01/26/2023    Current Medications: Current Facility-Administered Medications  Medication Dose Route Frequency Provider Last Rate Last Admin   alum & mag hydroxide-simeth (MAALOX/MYLANTA) 200-200-20 MG/5ML suspension 30 mL  30 mL Oral Q4H PRN Dahlia Byes C, NP       diphenhydrAMINE (BENADRYL) capsule 50 mg  50 mg Oral Q6H PRN Welford Roche, Josephine C, NP       Or   diphenhydrAMINE (BENADRYL) injection 50 mg  50 mg Intramuscular Q6H PRN Onuoha, Josephine C, NP       haloperidol (HALDOL) tablet 5 mg  5 mg Oral TID PRN Dahlia Byes C, NP       And   diphenhydrAMINE (BENADRYL) capsule 50 mg  50 mg Oral TID PRN Dahlia Byes C, NP       haloperidol lactate (HALDOL) injection 5 mg  5 mg Intramuscular TID PRN Dahlia Byes C, NP       And   diphenhydrAMINE (BENADRYL) injection 50 mg  50 mg Intramuscular TID PRN Dahlia Byes C, NP       And   LORazepam (ATIVAN) injection 2 mg  2 mg Intramuscular TID PRN Dahlia Byes C, NP       haloperidol lactate (HALDOL) injection 10 mg  10 mg Intramuscular TID PRN Dahlia Byes C, NP       And   diphenhydrAMINE (BENADRYL) injection 50 mg  50 mg Intramuscular TID PRN Dahlia Byes C, NP       And   LORazepam (ATIVAN) injection 2 mg  2 mg Intramuscular TID PRN  Earney Navy, NP       FLUoxetine (PROZAC) capsule 40 mg  40 mg Oral Daily Dahlia Byes C, NP   40 mg at 08/10/23 0808   levETIRAcetam (KEPPRA) tablet 750 mg  750 mg Oral BID Bobbitt, Shalon E, NP   750 mg at 08/10/23 1718   lisinopril (ZESTRIL) tablet 10 mg  10 mg Oral Daily Bobbitt, Shalon E, NP   10 mg at 08/10/23 0654   LORazepam (ATIVAN) tablet 2 mg  2 mg Oral Q6H PRN Dahlia Byes C, NP       nicotine polacrilex (NICORETTE) gum 2 mg  2 mg Oral PRN Abbott Pao, Nadir, MD   2 mg at 08/09/23 2136   OLANZapine (ZYPREXA) injection 10 mg  10 mg Intramuscular Daily PRN Dahlia Byes C, NP       OLANZapine zydis (ZYPREXA) disintegrating tablet 10 mg  10 mg Oral Daily PRN Dahlia Byes C, NP       risperiDONE (RISPERDAL) tablet 2 mg  2 mg Oral Daily Onuoha, Josephine C, NP   2 mg at 08/10/23 6045   risperiDONE (RISPERDAL) tablet 3 mg  3 mg Oral QHS Earney Navy, NP  3 mg at 08/09/23 2135   traZODone (DESYREL) tablet 150 mg  150 mg Oral QHS Dahlia Byes C, NP   150 mg at 08/09/23 2135   PTA Medications: Medications Prior to Admission  Medication Sig Dispense Refill Last Dose/Taking   FLUoxetine (PROZAC) 40 MG capsule Take 1 capsule (40 mg total) by mouth daily. 30 capsule 0 Past Month   gabapentin (NEURONTIN) 100 MG capsule Take 1 capsule (100 mg total) by mouth 3 (three) times daily. 90 capsule 1 Past Month   hydrOXYzine (ATARAX) 50 MG tablet Take 1 tablet (50 mg total) by mouth every 6 (six) hours as needed for anxiety. 10 tablet 0 Past Month   levETIRAcetam (KEPPRA) 750 MG tablet Take 1 tablet (750 mg total) by mouth every 12 (twelve) hours. 60 tablet 0 Past Month   Tiotropium Bromide-Olodaterol 2.5-2.5 MCG/ACT AERS Inhale 2 puffs into the lungs daily.   Past Month   albuterol (VENTOLIN HFA) 108 (90 Base) MCG/ACT inhaler Inhale 2 puffs into the lungs every 4 (four) hours as needed for wheezing or shortness of breath. 6.7 g 0    lisinopril (ZESTRIL) 10 MG tablet  Take 1 tablet (10 mg total) by mouth daily. 10 tablet 0    risperiDONE (RISPERDAL) 2 MG tablet Take 1 tablet (2 mg total) by mouth at bedtime. (Patient taking differently: Take 3 mg by mouth at bedtime.) 60 tablet 0    traZODone (DESYREL) 150 MG tablet Take 150 mg by mouth at bedtime as needed for sleep.      Musculoskeletal: Strength & Muscle Tone: within normal limits Gait & Station: normal Patient leans: N/A  Psychiatric Specialty Exam:  Presentation  General Appearance:  Disheveled  Eye Contact: Good  Speech: Clear and Coherent  Speech Volume: Normal  Handedness: Right  Mood and Affect  Mood: Depressed; Anxious  Affect: Congruent  Thought Process  Thought Processes: Coherent  Duration of Psychotic Symptoms:N/A Past Diagnosis of Schizophrenia or Psychoactive disorder: No  Descriptions of Associations:Intact  Orientation:Full (Time, Place and Person)  Thought Content:Logical  Hallucinations:Hallucinations: Visual Description of Visual Hallucinations: Dark shadows  Ideas of Reference:None  Suicidal Thoughts:Suicidal Thoughts: No  Homicidal Thoughts:Homicidal Thoughts: No  Sensorium  Memory: Immediate Good; Recent Good  Judgment: Poor  Insight: Poor  Executive Functions  Concentration: Good  Attention Span: Good  Recall: Good  Fund of Knowledge: Fair  Language: Good  Psychomotor Activity  Psychomotor Activity: Psychomotor Activity: Normal  Assets  Assets: Communication Skills; Resilience; Physical Health  Sleep  Sleep: Sleep: Fair Number of Hours of Sleep: 4  Physical Exam: Physical Exam Vitals reviewed.  Constitutional:      General: He is not in acute distress.    Appearance: He is not ill-appearing or toxic-appearing.  HENT:     Head: Normocephalic.     Right Ear: External ear normal.     Left Ear: External ear normal.     Nose: Nose normal.     Mouth/Throat:     Mouth: Mucous membranes are moist.      Pharynx: Oropharynx is clear.  Eyes:     Extraocular Movements: Extraocular movements intact.  Cardiovascular:     Rate and Rhythm: Normal rate.     Pulses: Normal pulses.  Pulmonary:     Effort: Pulmonary effort is normal.  Abdominal:     Comments: Deferred  Genitourinary:    Comments: Deferred Musculoskeletal:        General: Normal range of motion.     Cervical back:  Normal range of motion.  Skin:    General: Skin is warm.  Neurological:     General: No focal deficit present.     Mental Status: He is alert and oriented to person, place, and time.     Motor: No weakness.     Gait: Gait normal.    Review of Systems  Constitutional:  Negative for chills and fever.  HENT:  Negative for sore throat.   Eyes:  Negative for blurred vision.  Respiratory:  Negative for cough, sputum production, shortness of breath and wheezing.   Cardiovascular:  Negative for chest pain and palpitations.  Gastrointestinal:  Negative for abdominal pain, constipation, diarrhea, heartburn, nausea and vomiting.  Genitourinary:  Negative for dysuria, frequency and urgency.  Musculoskeletal:  Negative for myalgias and neck pain.  Skin:  Negative for itching and rash.  Neurological:  Positive for seizures (History of seizure). Negative for dizziness, tingling, tremors and headaches.  Endo/Heme/Allergies:        See allergy listing  Psychiatric/Behavioral:  Positive for depression. Negative for hallucinations, substance abuse and suicidal ideas. The patient is nervous/anxious and has insomnia.    Blood pressure 117/86, pulse 90, temperature (!) 97.3 F (36.3 C), temperature source Oral, resp. rate 20, height 6\' 1"  (1.854 m), weight 77.2 kg, SpO2 97%. Body mass index is 22.46 kg/m.  Treatment Plan Summary: Daily contact with patient to assess and evaluate symptoms and progress in treatment and Medication management  Physician Treatment Plan for Primary Diagnosis: Assessment: Raymond [name] is a  43 year old Caucasian male with prior psychiatric history of anxiety, panic attacks, depression, PTSD, who presents [facility] to [facility] from [facility] ED after stabilization for worsening depression, anxiety, panic attack, resulting in suicidal ideation by intentional overdosing on 90 pills of 500 mg Keppra and trazodone.    Active Problems:   Major depressive disorder, recurrent severe without psychotic features (HCC) Generalized anxiety disorder PTSD Seizure disorder  Plans: Medications: -- Continue Prozac capsule 40 mg p.o. daily for depression and anxiety --Continue him Risperdal tablet 2 mg p.o. daily for psychosis --Continue Risperdal tablet 3 mg p.o. daily at bedtime for psychosis --Continue trazodone 150 mg p.o. daily at bedtime for insomnia  Medications for other medical problems: -- Continue Keppra tablet 750 mg p.o. twice daily to prevent seizures -- Nicotine gum 2 mg p.o. at needed for smoking cessation -- Lorazepam tablet 2 mg every 6 hours as needed for anxiety, seizures -- Lisinopril Tablet 10 mg p.o. daily for high blood pressure  Continue BH Agitation Protocol --Haldol 5 mg, oral, 3 times daily as needed, mild agitation --Benadryl 50 mg, oral, 3 times daily as needed, mild agitation                                     OR  --Haldol injection 5 mg, IM, 3 times daily as needed, moderate agitation --Benadryl injection 50 mg, IM, 3 times daily as needed, moderate agitation --Ativan injection 2 mg, IM, 3 times daily as needed, moderate agitation                                     OR --Haldol injection 10 mg, IM, 3 times daily as needed, severe agitation --Benadryl injection 50 mg, IM, 3 times daily as needed, severe agitation --Ativan injection 2  mg, IM, 3 times daily as needed, severe agitation   --  The risks/benefits/side-effects/alternatives to this medication were discussed in detail with the patient and time was given for  questions. The patient consents to medication trial.  -- FDA             -- Metabolic profile and EKG monitoring obtained while on an atypical antipsychotic (BMI: Lipid Panel: HbgA1c: QTc:)              -- Encouraged patient to participate in unit milieu and in scheduled group therapies   Other PRN Medications -Acetaminophen 650 mg every 6 as needed/mild pain -Maalox 30 mL oral every 4 as needed/digestion -Magnesium hydroxide 30 mL daily as needed/mild constipation  --The risks/benefits/side-effects/alternatives to this medication were discussed in detail with the patient and time was given for questions. The patient consents to medication trial.  -- Metabolic profile and EKG monitoring obtained while on an atypical antipsychotic (BMI: Lipid Panel: HbgA1c: QTc:)  -- Encouraged patient to participate in unit milieu and in scheduled group therapies   Admission labs reviewed: CMP: CO2 17 low, glucose 111 high, otherwise normal.  CBC: Within normal limits.  BAL: Less than 10.  UDS: Positive for marijuana  New labs ordered: TSH, lipid panel, hemoglobin A1c, vitamin D 25-hydroxy  EKG reviewed: Sinus rhythm, ventricular rate 70, QT/QTc at bedtime 366/395   Safety and Monitoring: Voluntary admission to inpatient psychiatric unit for safety, stabilization and treatment Daily contact with patient to assess and evaluate symptoms and progress in treatment Patient's case to be discussed in multi-disciplinary team meeting Observation Level : q15 minute checks Vital signs: q12 hours Precautions: suicide, but pt currently verbally contracts for safety on unit    Discharge Planning: Social work and case management to assist with discharge planning and identification of hospital follow-up needs prior to discharge Estimated LOS: 5-7 days Discharge Concerns: Need to establish a safety plan; Medication compliance and effectiveness Discharge Goals: Return home with outpatient referrals for mental health  follow-up including medication management/psychotherapy.   -- Long Term Goals: Improvement in symptoms so as ready for discharge -- Short Term Goals: Ability to identify changes in lifestyle to reduce recurrence of condition will improve, Ability to verbalize feelings will improve, Ability to demonstrate [name]-control will improve, Ability to identify and develop effective coping behaviors will improve, Ability to maintain clinical measurements within normal limits will improve, Compliance with prescribed medications will improve, and Ability to identify triggers associated with substance abuse/mental health issues will improve Physician Treatment Plan for Secondary Diagnosis:  I certify that inpatient services furnished can reasonably be expected to improve the patient's condition.    Cecilie Lowers, FNP 3/10/20255:40 PM

## 2023-08-10 NOTE — Group Note (Signed)
 Occupational Therapy Group Note  Group Topic: Sleep Hygiene  Group Date: 08/10/2023 Start Time: 1430 End Time: 1500 Facilitators: Ted Mcalpine, OT   Group Description: Group encouraged increased participation and engagement through topic focused on sleep hygiene. Patients reflected on the quality of sleep they typically receive and identified areas that need improvement. Group was given background information on sleep and sleep hygiene, including common sleep disorders. Group members also received information on how to improve one's sleep and introduced a sleep diary as a tool that can be utilized to track sleep quality over a length of time. Group session ended with patients identifying one or more strategies they could utilize or implement into their sleep routine in order to improve overall sleep quality.        Therapeutic Goal(s):  Identify one or more strategies to improve overall sleep hygiene  Identify one or more areas of sleep that are negatively impacted (sleep too much, too little, etc)     Participation Level: Engaged   Participation Quality: Independent   Behavior: Appropriate   Speech/Thought Process: Relevant   Affect/Mood: Appropriate   Insight: Fair   Judgement: Fair      Modes of Intervention: Education  Patient Response to Interventions:  Attentive   Plan: Continue to engage patient in OT groups 2 - 3x/week.  08/10/2023  Ted Mcalpine, OT  Kerrin Champagne, OT

## 2023-08-10 NOTE — Group Note (Signed)
 Date:  08/10/2023 Time:  9:51 PM  Group Topic/Focus:  Alcoholics Anonymous (AA) Meeting    Participation Level:  Active  Participation Quality:  Appropriate  Affect:  Appropriate  Cognitive:  Appropriate  Insight: Appropriate  Engagement in Group:  Engaged  Modes of Intervention:  Discussion, Socialization, and Support  Additional Comments:  Patient attended AA  Raymond [name] 08/10/2023, 9:51 PM

## 2023-08-11 DIAGNOSIS — R45851 Suicidal ideations: Secondary | ICD-10-CM | POA: Diagnosis not present

## 2023-08-11 DIAGNOSIS — Z8659 Personal history of other mental and behavioral disorders: Secondary | ICD-10-CM | POA: Diagnosis not present

## 2023-08-11 DIAGNOSIS — F332 Major depressive disorder, recurrent severe without psychotic features: Secondary | ICD-10-CM | POA: Diagnosis not present

## 2023-08-11 DIAGNOSIS — F1090 Alcohol use, unspecified, uncomplicated: Secondary | ICD-10-CM | POA: Diagnosis not present

## 2023-08-11 LAB — LIPID PANEL
Cholesterol: 172 mg/dL (ref 0–200)
HDL: 53 mg/dL (ref >40)
LDL Cholesterol: 90 mg/dL (ref 0–99)
Total CHOL/HDL Ratio: 3.2 ratio
Triglycerides: 146 mg/dL (ref <150)
VLDL: 29 mg/dL (ref 0–40)

## 2023-08-11 LAB — HEMOGLOBIN A1C
Hgb A1c MFr Bld: 5.3 % (ref 4.8–5.6)
Mean Plasma Glucose: 105.41 mg/dL

## 2023-08-11 LAB — VITAMIN D 25 HYDROXY (VIT D DEFICIENCY, FRACTURES): Vit D, 25-Hydroxy: 27.91 ng/mL — ABNORMAL LOW (ref 30–100)

## 2023-08-11 LAB — TSH: TSH: 3.694 u[IU]/mL (ref 0.350–4.500)

## 2023-08-11 MED ORDER — POLYETHYLENE GLYCOL 3350 17 G PO PACK
17.0000 g | PACK | Freq: Every day | ORAL | Status: DC
  Administered 2023-08-11 – 2023-08-16 (×6): 17 g via ORAL
  Filled 2023-08-11 (×10): qty 1

## 2023-08-11 MED ORDER — DOCUSATE SODIUM 100 MG PO CAPS
100.0000 mg | ORAL_CAPSULE | Freq: Two times a day (BID) | ORAL | Status: DC
  Administered 2023-08-11 – 2023-08-18 (×15): 100 mg via ORAL
  Filled 2023-08-11 (×20): qty 1

## 2023-08-11 MED ORDER — ALBUTEROL SULFATE HFA 108 (90 BASE) MCG/ACT IN AERS
2.0000 | INHALATION_SPRAY | RESPIRATORY_TRACT | Status: DC | PRN
  Administered 2023-08-11 – 2023-08-16 (×7): 2 via RESPIRATORY_TRACT
  Filled 2023-08-11: qty 6.7

## 2023-08-11 NOTE — Progress Notes (Signed)
 [facility] MD Progress Note  08/11/2023 5:48 PM Raymond [name]  MRN:  [id number] Principal Problem: <principal problem not specified> Diagnosis: Active Problems:   Major depressive disorder, recurrent severe without psychotic features [facility])  Reason for admission:  Raymond [name] is a 43 year old Caucasian male with prior psychiatric history of anxiety, panic attacks, depression, PTSD, who presents voluntarily to [facility] from [facility] ED after stabilization for worsening depression, anxiety, panic attack, resulting in suicidal ideation by intentional overdosing on 90 pills of 500 mg Keppra and trazodone.   Yesterday the psychiatry team made the following recommendations: --Continue Prozac capsule 40 mg p.o. daily for depression and anxiety --Continue Risperdal tablet 2 mg p.o. daily for psychosis --Continue Risperdal tablet 3 mg p.o. daily at bedtime for psychosis --Continue trazodone 150 mg p.o. daily at bedtime for insomnia   Today's assessment notes: On assessment today, the pt reports that his mood is less depressed and improving.  He rates depression as #5/10, with 10 being high severity.  Patient is alert, oriented, to location, self, time and situation.  Patient reports his goal for this hospitalization is to "Not trying to hurt myself and get my meds right and keep my seizures under control."  Observed patient attending therapeutic milieu and participating in unit group activities.  He is compliant with psychotropic medications as per nurse's report.  When asked patient about suicidal thoughts, reports, " a little bit. "  When asked what a little bit means, he denies having suicidal ideation.  Made patient aware to report suicidal thought or ideations to nursing staff and all staff.  He denies homicidal ideation and denies AVH.  Speech is inconsistent and circumstantial.  Thought process is occasionally disorganized and inconsistent.  He denies delusional thinking  or paranoia.  Patient is being monitored every 15 minutes for safety. Reports that anxiety is at manageable level Sleep is good, nursing staff report patient sleeping over 7 hours last night Appetite is good Concentration is improving Energy level is adequate Denies suicidal thoughts.  Denies denies suicidal intent and plan.  Denies having any HI.  Denies having psychotic symptoms.   Denies having side effects to current psychiatric medications.    Total Time spent with patient: 45 minutes  Past Psychiatric History: Previous Psych Diagnoses: Major depressive disorder, recurrent severe without psychotic features, amphetamine abuse, cocaine abuse, substance-induced mood disorder, with suicide attempt.  Prior inpatient treatment: Admitted to [facility] behavioral health in [place] in 2020 for suicide attempt for 1 month.  Current/prior outpatient treatment: None Prior rehab hx: Rehab for alcohol x 1 month in 2019 Psychotherapy hx: Yes History of suicide: X multiple attempts History of homicide or aggression: Denies Psychiatric medication history: Thorazine, fluoxetine, hydroxyzine, risperidone, and trazodone. Psychiatric medication compliance history: Noncompliance Neuromodulation history: Denies  Current Psychiatrist: Denies Current therapist: Seeing Graciella Freer at the [place]   Past Medical History:  Past Medical History:  Diagnosis Date   Alcohol abuse    Anxiety    Hepatitis C    PTSD (post-traumatic stress disorder)     Past Surgical History:  Procedure Laterality Date   FACIAL FRACTURE SURGERY     metal plate under right eye   Family History: History reviewed. No pertinent family history. Family Psychiatric  History: See H&P Social History:  Social History   Substance and Sexual Activity  Alcohol Use Yes   Comment: 1 liter bottle of vodka prior to arrival     Social History   Substance  and Sexual Activity  Drug Use Yes   Types: Methamphetamines     Social History   Socioeconomic History   Marital status: Single    Spouse name: Not on file   Number of children: Not on file   Years of education: Not on file   Highest education level: Not on file  Occupational History   Not on file  Tobacco Use   Smoking status: Every Day    Current packs/day: 1.00    Types: Cigarettes    Passive exposure: Current   Smokeless tobacco: Never  Vaping Use   Vaping status: Never Used  Substance and Sexual Activity   Alcohol use: Yes    Comment: 1 liter bottle of vodka prior to arrival   Drug use: Yes    Types: Methamphetamines   Sexual activity: Not Currently  Other Topics Concern   Not on file  Social History Narrative   Not on file   Social Drivers of Health   Financial Resource Strain: Not on File (08/18/2022)   Received from [employer] [employer]    Financial Resource Strain: 0  Food Insecurity: No Food Insecurity (08/09/2023)   Hunger Vital Sign    Worried About Running Out of Food in the Last Year: Never true    Ran Out of Food in the Last Year: Never true  Transportation Needs: No Transportation Needs (08/09/2023)   PRAPARE - Administrator, Civil Service (Medical): No    Lack of Transportation (Non-Medical): No  Physical Activity: Not on File (08/18/2022)   Received from [place], [place]   Physical Activity    Physical Activity: 0  Stress: Not on File (08/18/2022)   Received from [facility], [place]   Stress    Stress: 0  Social Connections: Not on File (02/15/2023)   Received from [facility]   Social Connections    Connectedness: 0   Additional Social History:    Sleep: Good  Appetite:  Good  Current Medications: Current Facility-Administered Medications  Medication Dose Route Frequency Provider Last Rate Last Admin   alum & mag hydroxide-simeth (MAALOX/MYLANTA) 200-200-20 MG/5ML suspension 30 mL  30 mL Oral Q4H PRN Onuoha, Josephine C, NP       diphenhydrAMINE (BENADRYL) capsule 50 mg  50 mg Oral  Q6H PRN Onuoha, Josephine C, NP       Or   diphenhydrAMINE (BENADRYL) injection 50 mg  50 mg Intramuscular Q6H PRN Onuoha, Josephine C, NP       haloperidol (HALDOL) tablet 5 mg  5 mg Oral TID PRN Dahlia Byes C, NP       And   diphenhydrAMINE (BENADRYL) capsule 50 mg  50 mg Oral TID PRN Dahlia Byes C, NP       haloperidol lactate (HALDOL) injection 5 mg  5 mg Intramuscular TID PRN Dahlia Byes C, NP       And   diphenhydrAMINE (BENADRYL) injection 50 mg  50 mg Intramuscular TID PRN Dahlia Byes C, NP       And   LORazepam (ATIVAN) injection 2 mg  2 mg Intramuscular TID PRN Dahlia Byes C, NP       haloperidol lactate (HALDOL) injection 10 mg  10 mg Intramuscular TID PRN Dahlia Byes C, NP       And   diphenhydrAMINE (BENADRYL) injection 50 mg  50 mg Intramuscular TID PRN Dahlia Byes C, NP       And   LORazepam (ATIVAN) injection 2 mg  2 mg Intramuscular TID PRN Dahlia Byes C, NP       docusate sodium (COLACE) capsule 100 mg  100 mg Oral BID Massengill, Harrold Donath, MD   100 mg at 08/11/23 1647   FLUoxetine (PROZAC) capsule 40 mg  40 mg Oral Daily Onuoha, Josephine C, NP   40 mg at 08/11/23 0830   levETIRAcetam (KEPPRA) tablet 750 mg  750 mg Oral BID Bobbitt, Shalon E, NP   750 mg at 08/11/23 1647   lisinopril (ZESTRIL) tablet 10 mg  10 mg Oral Daily Bobbitt, Shalon E, NP   10 mg at 08/11/23 0830   nicotine polacrilex (NICORETTE) gum 2 mg  2 mg Oral PRN Abbott Pao, Nadir, MD   2 mg at 08/11/23 1648   polyethylene glycol (MIRALAX / GLYCOLAX) packet 17 g  17 g Oral Daily Massengill, Harrold Donath, MD   17 g at 08/11/23 0957   risperiDONE (RISPERDAL) tablet 2 mg  2 mg Oral Daily Onuoha, Josephine C, NP   2 mg at 08/11/23 0830   risperiDONE (RISPERDAL) tablet 3 mg  3 mg Oral QHS Onuoha, Josephine C, NP   3 mg at 08/10/23 2114   traZODone (DESYREL) tablet 150 mg  150 mg Oral QHS Dahlia Byes C, NP   150 mg at 08/10/23 2114    Lab Results:  Results for orders  placed or performed during the hospital encounter of 08/09/23 (from the past 48 hours)  TSH     Status: None   Collection Time: 08/11/23  6:29 AM  Result Value Ref Range   TSH 3.694 0.350 - 4.500 uIU/mL    Comment: Performed by a 3rd Generation assay with a functional sensitivity of <=0.01 uIU/mL. Performed at [facility], [address] [place], [place] [address]   Hemoglobin A1c     Status: None   Collection Time: 08/11/23  6:29 AM  Result Value Ref Range   Hgb A1c MFr Bld 5.3 4.8 - 5.6 %    Comment: (NOTE) Pre diabetes:          5.7%-6.4%  Diabetes:              >6.4%  Glycemic control for   <7.0% adults with diabetes    Mean Plasma Glucose 105.41 mg/dL    Comment: Performed at [facility], [address], [place], [place] [address]  Lipid panel     Status: None   Collection Time: 08/11/23  6:29 AM  Result Value Ref Range   Cholesterol 172 0 - 200 mg/dL   Triglycerides 213 <086 mg/dL   HDL 53 >57 mg/dL   Total CHOL/HDL Ratio 3.2 RATIO   VLDL 29 0 - 40 mg/dL   LDL Cholesterol 90 0 - 99 mg/dL    Comment:        Total Cholesterol/HDL:CHD Risk Coronary Heart Disease Risk Table                     Men   Women  1/2 Average Risk   3.4   3.3  Average Risk       5.0   4.4  2 X Average Risk   9.6   7.1  3 X Average Risk  23.4   11.0        Use the calculated Patient Ratio above and the CHD Risk Table to determine the patient's CHD Risk.        ATP III CLASSIFICATION (LDL):  <100  mg/dL   Optimal  244-010  mg/dL   Near or Above                    Optimal  130-159  mg/dL   Borderline  272-536  mg/dL   High  >644     mg/dL   Very High Performed at [facility], [address], [place], [place] [address]   VITAMIN D 25 Hydroxy (Vit-D Deficiency, Fractures)     Status: Abnormal   Collection Time: 08/11/23  6:29 AM  Result Value Ref Range   Vit D, 25-Hydroxy 27.91 (L) 30 - 100 ng/mL    Comment: (NOTE) Vitamin D deficiency  has been defined by the Institute of Medicine  and an Endocrine Society practice guideline as a level of serum 25-OH  vitamin D less than 20 ng/mL (1,2). The Endocrine Society went on to  further define vitamin D insufficiency as a level between 21 and 29  ng/mL (2).  1. IOM (Institute of Medicine). 2010. Dietary reference intakes for  calcium and D. [place]: [employer]. 2. Holick MF, Binkley [place], Bischoff-Ferrari HA, et al. Evaluation,  treatment, and prevention of vitamin D deficiency: an Endocrine  Society clinical practice guideline, JCEM. 2011 Jul; 96(7): 1911-30.  Performed at [facility], [address], [place], [place] [address]    Blood Alcohol level:  Lab Results  Component Value Date   ETH <10 08/08/2023   ETH 297 (H) 06/27/2023   Metabolic Disorder Labs: Lab Results  Component Value Date   HGBA1C 5.3 08/11/2023   MPG 105.41 08/11/2023   MPG 108.28 03/28/2023   Lab Results  Component Value Date   PROLACTIN 12.0 01/22/2023   PROLACTIN 19.5 11/20/2022   Lab Results  Component Value Date   CHOL 172 08/11/2023   TRIG 146 08/11/2023   HDL 53 08/11/2023   CHOLHDL 3.2 08/11/2023   VLDL 29 08/11/2023   LDLCALC 90 08/11/2023   LDLCALC 84 03/28/2023    Physical Findings: AIMS:  , ,  ,  ,    CIWA:    COWS:     Musculoskeletal: Strength & Muscle Tone: within normal limits Gait & Station: normal Patient leans: N/A  Psychiatric Specialty Exam:  Presentation  General Appearance:  Casual  Eye Contact: Good  Speech: Clear and Coherent  Speech Volume: Normal  Handedness: Right  Mood and Affect  Mood: Anxious; Depressed  Affect: Congruent  Thought Process  Thought Processes: Coherent  Descriptions of Associations:Intact  Orientation:Full (Time, Place and Person)  Thought Content:Logical  History of Schizophrenia/Schizoaffective disorder:No  Duration of Psychotic Symptoms:No data  recorded Hallucinations:Hallucinations: None Description of Visual Hallucinations: Denies visual hallucinations today  Ideas of Reference:None  Suicidal Thoughts:Suicidal Thoughts: No  Homicidal Thoughts:Homicidal Thoughts: No  Sensorium  Memory: Immediate Good; Recent Good  Judgment: Poor  Insight: Poor  Executive Functions  Concentration: Good  Attention Span: Good  Recall: Good  Fund of Knowledge: Fair  Language: Good  Psychomotor Activity  Psychomotor Activity: Psychomotor Activity: Normal  Assets  Assets: Communication Skills; Physical Health; Resilience  Sleep  Sleep: Sleep: Good Number of Hours of Sleep: 7  Physical Exam: Physical Exam Vitals and nursing note reviewed.  Constitutional:      General: He is not in acute distress.    Appearance: He is not toxic-appearing.  HENT:     Head: Normocephalic.     Nose: Nose normal.     Mouth/Throat:  Mouth: Mucous membranes are moist.     Pharynx: Oropharynx is clear.  Eyes:     Extraocular Movements: Extraocular movements intact.  Cardiovascular:     Rate and Rhythm: Normal rate.     Pulses: Normal pulses.  Pulmonary:     Effort: Pulmonary effort is normal.  Abdominal:     Comments: Deferred  Genitourinary:    Comments: Deferred Musculoskeletal:        General: Normal range of motion.     Cervical back: Normal range of motion.  Skin:    General: Skin is warm.  Neurological:     General: No focal deficit present.     Mental Status: He is alert and oriented to person, place, and time.  Psychiatric:        Mood and Affect: Mood normal.        Behavior: Behavior normal.        Thought Content: Thought content normal.    Review of Systems  Constitutional:  Negative for chills and fever.  HENT:  Negative for sore throat.   Eyes:  Negative for blurred vision.  Respiratory:  Negative for cough, sputum production, shortness of breath and wheezing.   Cardiovascular:  Negative for  chest pain and palpitations.  Gastrointestinal:  Negative for abdominal pain, constipation, diarrhea, heartburn, nausea and vomiting.  Genitourinary:  Negative for dysuria, frequency and urgency.  Musculoskeletal:  Negative for myalgias.  Skin:  Negative for itching and rash.  Neurological:  Positive for seizures. Negative for dizziness, tingling and headaches.  Endo/Heme/Allergies:        See allergy listing  Psychiatric/Behavioral:  Positive for depression. Negative for suicidal ideas. The patient is nervous/anxious. The patient does not have insomnia.    Blood pressure (!) 140/100, pulse 86, temperature 97.8 F (36.6 C), temperature source Oral, resp. rate 20, height 6\' 1"  (1.854 m), weight 77.2 kg, SpO2 98%. Body mass index is 22.46 kg/m.  Treatment Plan Summary: Daily contact with patient to assess and evaluate symptoms and progress in treatment and Medication management Physician Treatment Plan for Primary Diagnosis: Assessment: Daesean Lazarz is a 43 year old Caucasian male with prior psychiatric history of anxiety, panic attacks, depression, PTSD, who presents voluntarily to [facility] from [facility] ED after stabilization for worsening depression, anxiety, panic attack, resulting in suicidal ideation by intentional overdosing on 90 pills of 500 mg Keppra and trazodone.     Active Problems:   Major depressive disorder, recurrent severe without psychotic features (HCC) Generalized anxiety disorder PTSD Seizure disorder   Plans: Medications: -- Continue Prozac capsule 40 mg p.o. daily for depression and anxiety --Continue him Risperdal tablet 2 mg p.o. daily for psychosis --Continue Risperdal tablet 3 mg p.o. daily at bedtime for psychosis --Continue trazodone 150 mg p.o. daily at bedtime for insomnia   Medications for other medical problems: -- Continue Keppra tablet 750 mg p.o. twice daily to prevent seizures -- Nicotine gum 2 mg p.o. at  needed for smoking cessation -- Lorazepam tablet 2 mg every 6 hours as needed for anxiety, seizures -- Lisinopril Tablet 10 mg p.o. daily for high blood pressure   Continue BH Agitation Protocol --Haldol 5 mg, oral, 3 times daily as needed, mild agitation --Benadryl 50 mg, oral, 3 times daily as needed, mild agitation  OR  --Haldol injection 5 mg, IM, 3 times daily as needed, moderate agitation --Benadryl injection 50 mg, IM, 3 times daily as needed, moderate agitation --Ativan injection 2 mg, IM, 3 times daily as needed, moderate agitation                                     OR --Haldol injection 10 mg, IM, 3 times daily as needed, severe agitation --Benadryl injection 50 mg, IM, 3 times daily as needed, severe agitation --Ativan injection 2 mg, IM, 3 times daily as needed, severe agitation   --  The risks/benefits/side-effects/alternatives to this medication were discussed in detail with the patient and time was given for questions. The patient consents to medication trial.  -- FDA             -- Metabolic profile and EKG monitoring obtained while on an atypical antipsychotic (BMI: Lipid Panel: HbgA1c: QTc:)              -- Encouraged patient to participate in unit milieu and in scheduled group therapies    Other PRN Medications -Acetaminophen 650 mg every 6 as needed/mild pain -Maalox 30 mL oral every 4 as needed/digestion -Magnesium hydroxide 30 mL daily as needed/mild constipation   --The risks/benefits/side-effects/alternatives to this medication were discussed in detail with the patient and time was given for questions. The patient consents to medication trial.  -- Metabolic profile and EKG monitoring obtained while on an atypical antipsychotic (BMI: Lipid Panel: HbgA1c: QTc:)  -- Encouraged patient to participate in unit milieu and in scheduled group therapies    Admission labs reviewed: CMP: CO2 17 low, glucose 111 high, otherwise normal.   CBC: Within normal limits.  BAL: Less than 10.  UDS: Positive for marijuana   New labs ordered: TSH, lipid panel, hemoglobin A1c, vitamin D 25-hydroxy   EKG reviewed: Sinus rhythm, ventricular rate 70, QT/QTc at bedtime 366/395   Safety and Monitoring: Voluntary admission to inpatient psychiatric unit for safety, stabilization and treatment Daily contact with patient to assess and evaluate symptoms and progress in treatment Patient's case to be discussed in multi-disciplinary team meeting Observation Level : q15 minute checks Vital signs: q12 hours Precautions: suicide, but pt currently verbally contracts for safety on unit    Discharge Planning: Social work and case management to assist with discharge planning and identification of hospital follow-up needs prior to discharge Estimated LOS: 5-7 days Discharge Concerns: Need to establish a safety plan; Medication compliance and effectiveness Discharge Goals: Return home with outpatient referrals for mental health follow-up including medication management/psychotherapy.    -- Long Term Goals: Improvement in symptoms so as ready for discharge -- Short Term Goals: Ability to identify changes in lifestyle to reduce recurrence of condition will improve, Ability to verbalize feelings will improve, Ability to demonstrate self-control will improve, Ability to identify and develop effective coping behaviors will improve, Ability to maintain clinical measurements within normal limits will improve, Compliance with prescribed medications will improve, and Ability to identify triggers associated with substance abuse/mental health issues will improve Physician Treatment Plan for Secondary Diagnosis:  I certify that inpatient services furnished can reasonably be expected to improve the patient's condition.    Cecilie Lowers, FNP 08/11/2023, 5:48 PM  I discussed the case with the APP, and I agree with the assessment and plan of care as documented in the  APP's note , as addended by me  or notated below: Started schedule Colace and MiraLAX for constipation, no bowel movement in 4 days, monitor this.  Phineas Inches, MD Psychiatrist

## 2023-08-11 NOTE — Group Note (Signed)
 LCSW Group Therapy Note   Group Date: 08/10/2023 Start Time: 1100 End Time: 1200  Participation:  patient was present and actively participated in the discussion  Type of Therapy:  Group Therapy  Topic:  "From "One Day" to "Today is Day One":  Begin Your Journey to Better Health and Mental Well-Being  Objective:  To educate participants on the importance of routine, sleep, diet, and movement for improving mental health and overall well-being. Encourage goal-setting for small, achievable changes.  3 Goals: Encourage participants to set one small, achievable goal related to sleep, diet, or movement for the coming week. Promote self-awareness by discussing the connection between physical and mental health. Support accountability by having participants share goals with the group.  Summary: Participants engaged in a discussion about lifestyle factors influencing mental health, including routines, sleep, nutrition, and movement. They set personal goals for the week to improve well-being and shared their goals for accountability.  Therapeutic Modalities: Cognitive Behavioral Therapy (CBT) for exploring the relationship between thoughts, feelings, and behaviors. Psychoeducation on lifestyle changes and their impact on mental health. Goal-setting to promote empowerment and motivation.   Alla Feeling, LCSWA 08/11/2023  12:39 PM
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
## 2023-08-11 NOTE — Group Note (Signed)
 Recreation Therapy Group Note   Group Topic:Animal Assisted Therapy   Group Date: 08/11/2023 Start Time: 0946 End Time: 1030 Facilitators: Kirk Sampley-McCall, LRT,CTRS Location: [address] Dayroom   Animal-Assisted Activity (AAA) Program Checklist/Progress Notes Patient Eligibility Criteria Checklist & Daily Group note for Rec Tx Intervention  AAA/T Program Assumption of Risk Form signed by Patient/ or Parent Legal Guardian Yes  Patient is free of allergies or severe asthma Yes  Patient reports no fear of animals Yes  Patient reports no history of cruelty to animals Yes  Patient understands his/her participation is voluntary Yes  Patient washes hands before animal contact Yes  Patient washes hands after animal contact Yes  Education: Hand Washing, Appropriate Animal Interaction   Education Outcome: Acknowledges education.    Affect/Mood: Appropriate   Participation Level: Engaged   Participation Quality: Independent   Behavior: Appropriate   Speech/Thought Process: Focused   Insight: Good   Judgement: Good   Modes of Intervention: Teaching laboratory technician   Patient Response to Interventions:  Engaged   Education Outcome:  In group clarification offered    Clinical Observations/Individualized Feedback: Patient attended session and interacted appropriately with therapy dog and peers. Patient asked appropriate questions about therapy dog and his training. Patient shared stories about their pets at home with group.      Plan: Continue to engage patient in RT group sessions 2-3x/week.   Doaa Kendzierski-McCall, LRT,CTRS 08/11/2023 1:39 PM

## 2023-08-11 NOTE — Progress Notes (Signed)
   08/10/23 2100  Psych Admission Type (Psych Patients Only)  Admission Status Voluntary  Psychosocial Assessment  Patient Complaints Anxiety  Eye Contact Intense  Facial Expression Blank  Affect Appropriate to circumstance  Speech Logical/coherent  Interaction Assertive  Motor Activity Fidgety  Appearance/Hygiene Unremarkable  Behavior Characteristics Cooperative  Mood Depressed  Thought Process  Coherency Circumstantial  Content Blaming others  Delusions None reported or observed  Perception WDL  Hallucination None reported or observed  Judgment Limited  Confusion None  Danger to Self  Current suicidal ideation? Denies  Agreement Not to Harm Self Yes  Description of Agreement Verbal  Danger to Others  Danger to Others None reported or observed

## 2023-08-11 NOTE — Group Note (Signed)
 Date:  08/11/2023 Time:  2:17 PM  Group Topic/Focus:  Goals Group:   The focus of this group is to help patients establish daily goals to achieve during treatment and discuss how the patient can incorporate goal setting into their daily lives to aide in recovery. Orientation:   The focus of this group is to educate the patient on the purpose and policies of crisis stabilization and provide a format to answer questions about their admission.  The group details unit policies and expectations of patients while admitted.    Participation Level:  Active  Participation Quality:  Appropriate  Affect:  Appropriate  Cognitive:  Appropriate  Insight: Appropriate  Engagement in Group:  Developing/Improving  Modes of Intervention:  Discussion  Additional Comments:    Raymond [name] D Abas Leicht 08/11/2023, 2:17 PM

## 2023-08-11 NOTE — Plan of Care (Signed)
   Problem: Education: Goal: Emotional status will improve Outcome: Progressing Goal: Mental status will improve Outcome: Progressing

## 2023-08-11 NOTE — Progress Notes (Signed)
 Patient was calm and cooperative tonight. Med compliant. Denies SI,HI,AVH. Attended group.Took a shower. Patient encouraged to notify staff of any needs or concerns. Patient remains safe as we continue to monitor with q 15 min checks.

## 2023-08-11 NOTE — Progress Notes (Signed)
 Patient rated his depression level 7/10 and his anxiety level 6/10 with 10 being the highest and 0 none. Pt identified his goal for today as, "Going home". Pt denied suicidal ideation and stated, " Last night I felt suicidal and had a plan to hang myself but not today". Writer reminded patient to notify staff immediately if suicidal feelings occur. Pt verbalized understanding and verbally agreed not to harm himself. Medication and group compliant. Pt observed interacting well with some peers. Appetite good on shift. Safety maintained.  08/11/23 4098  Psych Admission Type (Psych Patients Only)  Admission Status Voluntary  Psychosocial Assessment  Patient Complaints Anxiety;Depression  Eye Contact Fair  Facial Expression Anxious  Affect Appropriate to circumstance  Speech Logical/coherent  Interaction Assertive  Motor Activity Fidgety  Appearance/Hygiene Unremarkable  Behavior Characteristics Cooperative  Mood Depressed;Anxious  Thought Process  Coherency Circumstantial  Content Blaming others  Delusions None reported or observed  Perception WDL  Hallucination None reported or observed  Judgment Limited  Confusion None  Danger to Self  Current suicidal ideation? Denies  Agreement Not to Harm Self Yes  Description of Agreement Verbal  Danger to Others  Danger to Others None reported or observed

## 2023-08-11 NOTE — BHH Group Notes (Signed)
 BHH Group Notes:  (Nursing/MHT/Case Management/Adjunct)  Date:  08/11/2023  Time:  10:19 PM  Type of Therapy:  Psychoeducational Skills  Participation Level:  Active  Participation Quality:  Appropriate  Affect:  Depressed  Cognitive:  Appropriate  Insight:  Improving  Engagement in Group:  Developing/Improving  Modes of Intervention:  Education  Summary of Progress/Problems: The patient rated his day as a 7 out of a possible 10. He states that he had a good talk with his two brothers who are supportive of him. He also spoke with his father who lives [place] of [place].   Raymond [name] 08/11/2023, 10:19 PM

## 2023-08-11 NOTE — Plan of Care (Signed)
  Problem: Education: Goal: Mental status will improve Outcome: Progressing   Problem: Activity: Goal: Interest or engagement in activities will improve Outcome: Progressing Goal: Sleeping patterns will improve Outcome: Progressing   Problem: Coping: Goal: Ability to verbalize frustrations and anger appropriately will improve Outcome: Progressing Goal: Ability to demonstrate self-control will improve Outcome: Progressing   Problem: Safety: Goal: Periods of time without injury will increase Outcome: Progressing

## 2023-08-12 DIAGNOSIS — F332 Major depressive disorder, recurrent severe without psychotic features: Secondary | ICD-10-CM | POA: Diagnosis not present

## 2023-08-12 MED ORDER — FLUOXETINE HCL 20 MG PO CAPS
60.0000 mg | ORAL_CAPSULE | Freq: Every day | ORAL | Status: DC
  Administered 2023-08-13 – 2023-08-18 (×6): 60 mg via ORAL
  Filled 2023-08-12 (×8): qty 3

## 2023-08-12 MED ORDER — FLUOXETINE HCL 20 MG PO CAPS
20.0000 mg | ORAL_CAPSULE | ORAL | Status: AC
  Administered 2023-08-12: 20 mg via ORAL
  Filled 2023-08-12 (×2): qty 1

## 2023-08-12 MED ORDER — RISPERIDONE 2 MG PO TABS
2.0000 mg | ORAL_TABLET | Freq: Every day | ORAL | Status: DC
  Administered 2023-08-12 – 2023-08-17 (×6): 2 mg via ORAL
  Filled 2023-08-12 (×8): qty 1

## 2023-08-12 NOTE — BH Assessment (Signed)
 Patient alert and oriented, pt attended group and came to window for medications. Pt requested inhaler, prn given and pt is resting comfortably, Prior to giving meds, pt stated " If I don't get a inhaler and I have an asthma attack, I will sue this place". Pt denies SI/HI and rates anxiety and depression at a 7-8. Pt also states he has not had a BM as of yet, but has taken medication for it

## 2023-08-12 NOTE — Plan of Care (Signed)
   Problem: Education: Goal: Knowledge of [facility] Education information/materials will improve Outcome: Progressing Goal: Emotional status will improve Outcome: Progressing

## 2023-08-12 NOTE — Progress Notes (Signed)
   08/12/23 2200  Psych Admission Type (Psych Patients Only)  Admission Status Voluntary  Psychosocial Assessment  Patient Complaints Anxiety  Eye Contact Fair  Facial Expression Animated  Affect Silly  Speech Logical/coherent  Interaction Assertive  Motor Activity Fidgety  Appearance/Hygiene Unremarkable  Behavior Characteristics Appropriate to situation;Cooperative  Mood Euthymic  Thought Process  Coherency WDL  Content WDL  Delusions None reported or observed  Perception WDL  Hallucination None reported or observed  Judgment Impaired  Confusion None  Danger to Self  Current suicidal ideation? Denies  Description of Suicide Plan None  Agreement Not to Harm Self Yes  Description of Agreement Verbal contract for safety  Danger to Others  Danger to Others None reported or observed

## 2023-08-12 NOTE — Progress Notes (Incomplete)
 [facility] MD Progress Note  08/12/2023 8:46 AM Raymond [name]  MRN:  [id number] Principal Problem: Major depressive disorder, recurrent severe without psychotic features (HCC) Diagnosis: Principal Problem:   Major depressive disorder, recurrent severe without psychotic features (HCC)  Total Time spent with patient: 45 minutes  Reason for admission:  Raymond [name] is a 43 year old Caucasian male with prior psychiatric history of anxiety, panic attacks, depression, PTSD, who presents voluntarily to [facility] from [facility] ED after stabilization for worsening depression, anxiety, panic attack, resulting in suicidal ideation by intentional overdosing on 90 pills of 500 mg Keppra and trazodone.   Yesterday the psychiatry team made the following recommendations: --Continue Prozac capsule 40 mg p.o. daily for depression and anxiety --Continue Risperdal tablet 2 mg p.o. daily for psychosis --Continue Risperdal tablet 3 mg p.o. daily at bedtime for psychosis --Continue trazodone 150 mg p.o. daily at bedtime for insomnia   Daily Evaluation:      Case staffed with the attending psychiatrist, N. Massengill.    Discharge Planning:      Past Psychiatric History: Previous Psych Diagnoses: Major depressive disorder, recurrent severe without psychotic features, amphetamine abuse, cocaine abuse, substance-induced mood disorder, with suicide attempt.  Prior inpatient treatment: Admitted to [facility] behavioral health in [place] in 2020 for suicide attempt for 1 month.  Current/prior outpatient treatment: None Prior rehab hx: Rehab for alcohol x 1 month in 2019 Psychotherapy hx: Yes History of suicide: X multiple attempts History of homicide or aggression: Denies Psychiatric medication history: Thorazine, fluoxetine, hydroxyzine, risperidone, and trazodone. Psychiatric medication compliance history: Noncompliance Neuromodulation history: Denies  Current  Psychiatrist: Denies Current therapist: Seeing Raymond [name] at the [place]   Past Medical History:  Past Medical History:  Diagnosis Date   Alcohol abuse    Anxiety    Hepatitis C    PTSD (post-traumatic stress disorder)     Past Surgical History:  Procedure Laterality Date   FACIAL FRACTURE SURGERY     metal plate under right eye   Family History: History reviewed. No pertinent family history. Family Psychiatric  History: See H&P Social History:  Social History   Substance and Sexual Activity  Alcohol Use Yes   Comment: 1 liter bottle of vodka prior to arrival     Social History   Substance and Sexual Activity  Drug Use Yes   Types: Methamphetamines    Social History   Socioeconomic History   Marital status: Single    Spouse name: Not on file   Number of children: Not on file   Years of education: Not on file   Highest education level: Not on file  Occupational History   Not on file  Tobacco Use   Smoking status: Every Day    Current packs/day: 1.00    Types: Cigarettes    Passive exposure: Current   Smokeless tobacco: Never  Vaping Use   Vaping status: Never Used  Substance and Sexual Activity   Alcohol use: Yes    Comment: 1 liter bottle of vodka prior to arrival   Drug use: Yes    Types: Methamphetamines   Sexual activity: Not Currently  Other Topics Concern   Not on file  Social History Narrative   Not on file   Social Drivers of Health   Financial Resource Strain: Not on File (08/18/2022)   Received from [employer] [employer]    Financial Resource Strain: 0  Food Insecurity: No Food Insecurity (08/09/2023)  Hunger Vital Sign    Worried About Running Out of Food in the Last Year: Never true    Ran Out of Food in the Last Year: Never true  Transportation Needs: No Transportation Needs (08/09/2023)   PRAPARE - Administrator, Civil Service (Medical): No    Lack of Transportation (Non-Medical): No  Physical  Activity: Not on File (08/18/2022)   Received from [place], [place]   Physical Activity    Physical Activity: 0  Stress: Not on File (08/18/2022)   Received from [facility], [place]   Stress    Stress: 0  Social Connections: Not on File (02/15/2023)   Received from [facility]   Social Connections    Connectedness: 0   Additional Social History:    Sleep: Good  Appetite:  Good  Current Medications: Current Facility-Administered Medications  Medication Dose Route Frequency Provider Last Rate Last Admin   albuterol (VENTOLIN HFA) 108 (90 Base) MCG/ACT inhaler 2 puff  2 puff Inhalation Q4H PRN Starleen Blue, NP   2 puff at 08/11/23 2156   alum & mag hydroxide-simeth (MAALOX/MYLANTA) 200-200-20 MG/5ML suspension 30 mL  30 mL Oral Q4H PRN Dahlia Byes C, NP       diphenhydrAMINE (BENADRYL) capsule 50 mg  50 mg Oral Q6H PRN Welford Roche, Josephine C, NP       Or   diphenhydrAMINE (BENADRYL) injection 50 mg  50 mg Intramuscular Q6H PRN Onuoha, Josephine C, NP       haloperidol (HALDOL) tablet 5 mg  5 mg Oral TID PRN Dahlia Byes C, NP       And   diphenhydrAMINE (BENADRYL) capsule 50 mg  50 mg Oral TID PRN Dahlia Byes C, NP       haloperidol lactate (HALDOL) injection 5 mg  5 mg Intramuscular TID PRN Dahlia Byes C, NP       And   diphenhydrAMINE (BENADRYL) injection 50 mg  50 mg Intramuscular TID PRN Dahlia Byes C, NP       And   LORazepam (ATIVAN) injection 2 mg  2 mg Intramuscular TID PRN Dahlia Byes C, NP       haloperidol lactate (HALDOL) injection 10 mg  10 mg Intramuscular TID PRN Dahlia Byes C, NP       And   diphenhydrAMINE (BENADRYL) injection 50 mg  50 mg Intramuscular TID PRN Dahlia Byes C, NP       And   LORazepam (ATIVAN) injection 2 mg  2 mg Intramuscular TID PRN Dahlia Byes C, NP       docusate sodium (COLACE) capsule 100 mg  100 mg Oral BID Massengill, Harrold Donath, MD   100 mg at 08/12/23 0800   FLUoxetine (PROZAC) capsule 20 mg  20 mg Oral NOW  Massengill, Harrold Donath, MD       Melene Muller ON 08/13/2023] FLUoxetine (PROZAC) capsule 60 mg  60 mg Oral Daily Massengill, Nathan, MD       levETIRAcetam (KEPPRA) tablet 750 mg  750 mg Oral BID Bobbitt, Shalon E, NP   750 mg at 08/12/23 0800   lisinopril (ZESTRIL) tablet 10 mg  10 mg Oral Daily Bobbitt, Shalon E, NP   10 mg at 08/12/23 0800   nicotine polacrilex (NICORETTE) gum 2 mg  2 mg Oral PRN Abbott Pao, Nadir, MD   2 mg at 08/12/23 0801   polyethylene glycol (MIRALAX / GLYCOLAX) packet 17 g  17 g Oral Daily Massengill, Harrold Donath, MD   17 g at 08/12/23 0801   risperiDONE (  RISPERDAL) tablet 2 mg  2 mg Oral QHS Massengill, Nathan, MD       traZODone (DESYREL) tablet 150 mg  150 mg Oral QHS Dahlia Byes C, NP   150 mg at 08/11/23 2159    Lab Results:  Results for orders placed or performed during the hospital encounter of 08/09/23 (from the past 48 hours)  TSH     Status: None   Collection Time: 08/11/23  6:29 AM  Result Value Ref Range   TSH 3.694 0.350 - 4.500 uIU/mL    Comment: Performed by a 3rd Generation assay with a functional sensitivity of <=0.01 uIU/mL. Performed at [facility], [address] [place], [place] [address]   Hemoglobin A1c     Status: None   Collection Time: 08/11/23  6:29 AM  Result Value Ref Range   Hgb A1c MFr Bld 5.3 4.8 - 5.6 %    Comment: (NOTE) Pre diabetes:          5.7%-6.4%  Diabetes:              >6.4%  Glycemic control for   <7.0% adults with diabetes    Mean Plasma Glucose 105.41 mg/dL    Comment: Performed at [facility], [address], [place], [place] [address]  Lipid panel     Status: None   Collection Time: 08/11/23  6:29 AM  Result Value Ref Range   Cholesterol 172 0 - 200 mg/dL   Triglycerides 295 <621 mg/dL   HDL 53 >30 mg/dL   Total CHOL/HDL Ratio 3.2 RATIO   VLDL 29 0 - 40 mg/dL   LDL Cholesterol 90 0 - 99 mg/dL    Comment:        Total Cholesterol/HDL:CHD Risk Coronary Heart Disease Risk Table                      Men   Women  1/2 Average Risk   3.4   3.3  Average Risk       5.0   4.4  2 X Average Risk   9.6   7.1  3 X Average Risk  23.4   11.0        Use the calculated Patient Ratio above and the CHD Risk Table to determine the patient's CHD Risk.        ATP III CLASSIFICATION (LDL):  <100     mg/dL   Optimal  865-784  mg/dL   Near or Above                    Optimal  130-159  mg/dL   Borderline  696-295  mg/dL   High  >284     mg/dL   Very High Performed at [facility], [address], [place], [place] [address]   VITAMIN D 25 Hydroxy (Vit-D Deficiency, Fractures)     Status: Abnormal   Collection Time: 08/11/23  6:29 AM  Result Value Ref Range   Vit D, 25-Hydroxy 27.91 (L) 30 - 100 ng/mL    Comment: (NOTE) Vitamin D deficiency has been defined by the Institute of Medicine  and an Endocrine Society practice guideline as a level of serum 25-OH  vitamin D less than 20 ng/mL (1,2). The Endocrine Society went on to  further define vitamin D insufficiency as a level between 21 and 29  ng/mL (2).  1. IOM (Institute of Medicine). 2010. Dietary reference intakes for  calcium  and D. [place]: [employer]. 2. Holick MF, Binkley La Jara, Bischoff-Ferrari HA, et al. Evaluation,  treatment, and prevention of vitamin D deficiency: an Endocrine  Society clinical practice guideline, JCEM. 2011 Jul; 96(7): 1911-30.  Performed at [facility], [address] [place], [place] [address]    Blood Alcohol level:  Lab Results  Component Value Date   ETH <10 08/08/2023   ETH 297 (H) 06/27/2023   Metabolic Disorder Labs: Lab Results  Component Value Date   HGBA1C 5.3 08/11/2023   MPG 105.41 08/11/2023   MPG 108.28 03/28/2023   Lab Results  Component Value Date   PROLACTIN 12.0 01/22/2023   PROLACTIN 19.5 11/20/2022   Lab Results  Component Value Date   CHOL 172 08/11/2023   TRIG 146 08/11/2023   HDL 53 08/11/2023   CHOLHDL 3.2  08/11/2023   VLDL 29 08/11/2023   LDLCALC 90 08/11/2023   LDLCALC 84 03/28/2023    Physical Findings: AIMS:  , ,  ,  ,    CIWA:    COWS:     Musculoskeletal: Strength & Muscle Tone: within normal limits Gait & Station: normal Patient leans: N/A  Psychiatric Specialty Exam:  Presentation  General Appearance:  Casual  Eye Contact: Good  Speech: Clear and Coherent  Speech Volume: Normal  Handedness: Right  Mood and Affect  Mood: Anxious; Depressed  Affect: Congruent  Thought Process  Thought Processes: Coherent  Descriptions of Associations:Intact  Orientation:Full (Time, Place and Person)  Thought Content:Logical  History of Schizophrenia/Schizoaffective disorder:No  Duration of Psychotic Symptoms:No data recorded Hallucinations:Hallucinations: None Description of Visual Hallucinations: Denies visual hallucinations today  Ideas of Reference:None  Suicidal Thoughts:Suicidal Thoughts: No  Homicidal Thoughts:Homicidal Thoughts: No  Sensorium  Memory: Immediate Good; Recent Good  Judgment: Poor  Insight: Poor  Executive Functions  Concentration: Good  Attention Span: Good  Recall: Good  Fund of Knowledge: Fair  Language: Good  Psychomotor Activity  Psychomotor Activity: Psychomotor Activity: Normal  Assets  Assets: Communication Skills; Physical Health; Resilience  Sleep  Sleep: Sleep: Good Number of Hours of Sleep: 7  Physical Exam: Physical Exam Vitals and nursing note reviewed.  Constitutional:      General: He is not in acute distress.    Appearance: He is not toxic-appearing.  HENT:     Head: Normocephalic.     Nose: Nose normal.     Mouth/Throat:     Mouth: Mucous membranes are moist.     Pharynx: Oropharynx is clear.  Eyes:     Extraocular Movements: Extraocular movements intact.  Cardiovascular:     Rate and Rhythm: Normal rate.     Pulses: Normal pulses.  Pulmonary:     Effort: Pulmonary effort  is normal.  Abdominal:     Comments: Deferred  Genitourinary:    Comments: Deferred Musculoskeletal:        General: Normal range of motion.     Cervical back: Normal range of motion.  Skin:    General: Skin is warm.  Neurological:     General: No focal deficit present.     Mental Status: He is alert and oriented to person, place, and time.  Psychiatric:        Mood and Affect: Mood normal.        Behavior: Behavior normal.        Thought Content: Thought content normal.    Review of Systems  Constitutional:  Negative for chills and fever.  HENT:  Negative for sore throat.   Eyes:  Negative for blurred vision.  Respiratory:  Negative for cough, sputum production, shortness of breath and wheezing.   Cardiovascular:  Negative for chest pain and palpitations.  Gastrointestinal:  Negative for abdominal pain, constipation, diarrhea, heartburn, nausea and vomiting.  Genitourinary:  Negative for dysuria, frequency and urgency.  Musculoskeletal:  Negative for myalgias.  Skin:  Negative for itching and rash.  Neurological:  Positive for seizures. Negative for dizziness, tingling and headaches.  Endo/Heme/Allergies:        See allergy listing  Psychiatric/Behavioral:  Positive for depression. Negative for suicidal ideas. The patient is nervous/anxious. The patient does not have insomnia.    Blood pressure (!) 113/52, pulse (!) 105, temperature 97.8 F (36.6 C), temperature source Oral, resp. rate 20, height 6\' 1"  (1.854 m), weight 77.2 kg, SpO2 98%. Body mass index is 22.46 kg/m.  Treatment Plan Summary: Daily contact with patient to assess and evaluate symptoms and progress in treatment and Medication management Physician Treatment Plan for Primary Diagnosis: Assessment: Ondre Salvetti is a 43 year old Caucasian male with prior psychiatric history of anxiety, panic attacks, depression, PTSD, who presents voluntarily to [facility] from [facility] ED  after stabilization for worsening depression, anxiety, panic attack, resulting in suicidal ideation by intentional overdosing on 90 pills of 500 mg Keppra and trazodone.     Active Problems:   Major depressive disorder, recurrent severe without psychotic features (HCC) Generalized anxiety disorder PTSD Seizure disorder   Plans: Medications: -- Continue Prozac capsule 40 mg p.o. daily for depression and anxiety --Continue him Risperdal tablet 2 mg p.o. daily for psychosis --Continue Risperdal tablet 3 mg p.o. daily at bedtime for psychosis --Continue trazodone 150 mg p.o. daily at bedtime for insomnia   Medications for other medical problems: -- Continue Keppra tablet 750 mg p.o. twice daily to prevent seizures -- Nicotine gum 2 mg p.o. at needed for smoking cessation -- Lorazepam tablet 2 mg every 6 hours as needed for anxiety, seizures -- Lisinopril Tablet 10 mg p.o. daily for high blood pressure   Continue BH Agitation Protocol --Haldol 5 mg, oral, 3 times daily as needed, mild agitation --Benadryl 50 mg, oral, 3 times daily as needed, mild agitation                                     OR  --Haldol injection 5 mg, IM, 3 times daily as needed, moderate agitation --Benadryl injection 50 mg, IM, 3 times daily as needed, moderate agitation --Ativan injection 2 mg, IM, 3 times daily as needed, moderate agitation                                     OR --Haldol injection 10 mg, IM, 3 times daily as needed, severe agitation --Benadryl injection 50 mg, IM, 3 times daily as needed, severe agitation --Ativan injection 2 mg, IM, 3 times daily as needed, severe agitation   --  The risks/benefits/side-effects/alternatives to this medication were discussed in detail with the patient and time was given for questions. The patient consents to medication trial.  -- FDA             -- Metabolic profile and EKG monitoring obtained while on an atypical antipsychotic (BMI: Lipid Panel: HbgA1c: QTc:)               --  Encouraged patient to participate in unit milieu and in scheduled group therapies    Other PRN Medications -Acetaminophen 650 mg every 6 as needed/mild pain -Maalox 30 mL oral every 4 as needed/digestion -Magnesium hydroxide 30 mL daily as needed/mild constipation   --The risks/benefits/side-effects/alternatives to this medication were discussed in detail with the patient and time was given for questions. The patient consents to medication trial.  -- Metabolic profile and EKG monitoring obtained while on an atypical antipsychotic (BMI: Lipid Panel: HbgA1c: QTc:)  -- Encouraged patient to participate in unit milieu and in scheduled group therapies    Admission labs reviewed: CMP: CO2 17 low, glucose 111 high, otherwise normal.  CBC: Within normal limits.  BAL: Less than 10.  UDS: Positive for marijuana   New labs ordered: TSH, lipid panel, hemoglobin A1c, vitamin D 25-hydroxy   EKG reviewed: Sinus rhythm, ventricular rate 70, QT/QTc at bedtime 366/395   Safety and Monitoring: Voluntary admission to inpatient psychiatric unit for safety, stabilization and treatment Daily contact with patient to assess and evaluate symptoms and progress in treatment Patient's case to be discussed in multi-disciplinary team meeting Observation Level : q15 minute checks Vital signs: q12 hours Precautions: suicide, but pt currently verbally contracts for safety on unit    Discharge Planning: Social work and case management to assist with discharge planning and identification of hospital follow-up needs prior to discharge Estimated LOS: 5-7 days Discharge Concerns: Need to establish a safety plan; Medication compliance and effectiveness Discharge Goals: Return home with outpatient referrals for mental health follow-up including medication management/psychotherapy.    -- Long Term Goals: Improvement in symptoms so as ready for discharge -- Short Term Goals: Ability to identify changes in  lifestyle to reduce recurrence of condition will improve, Ability to verbalize feelings will improve, Ability to demonstrate self-control will improve, Ability to identify and develop effective coping behaviors will improve, Ability to maintain clinical measurements within normal limits will improve, Compliance with prescribed medications will improve, and Ability to identify triggers associated with substance abuse/mental health issues will improve Physician Treatment Plan for Secondary Diagnosis:  I certify that inpatient services furnished can reasonably be expected to improve the patient's condition.    Norma Fredrickson, NP 08/12/2023, 8:46 AM Patient ID: Raymond [name], male   DOB: 1980/11/02, 43 y.o.   MRN: [id number]

## 2023-08-12 NOTE — Group Note (Signed)
 Recreation Therapy Group Note   Group Topic:Leisure Education  Group Date: 08/12/2023 Start Time: 0933 End Time: 1000 Facilitators: Fox Salminen-McCall, LRT,CTRS Location: [address] Dayroom   Group Topic: Leisure Education  Goal Area(s) Addresses:  Patient will successfully identify positive leisure and recreation activities.  Patient will acknowledge benefits of participation in healthy leisure activities post discharge.  Patient will actively work with peers toward a shared goal.   Intervention: Competitive Group Game    Activity: Pictionary. In groups of 5-7, patients took turns trying to guess the picture being drawn on the board by their teammate. If the team guessed the correct answer, they won a point.  If the team guessed wrong, the other team got a chance to steal the point. After several rounds of game play, the team with the most points were declared winners. Post-activity discussion reviewed benefits of positive recreation outlets: reducing stress, improving coping mechanisms, increasing self-esteem, and building larger support systems.   Education:  Teacher, English as a foreign language, Leisure as Merchant navy officer, Programmer, applications, Building control surveyor   Education Outcome: Acknowledges education/In group clarification offered/Needs additional education   Affect/Mood: Appropriate   Participation Level: Engaged   Participation Quality: Independent   Behavior: Appropriate   Speech/Thought Process: Focused   Insight: Good   Judgement: Good   Modes of Intervention: Activity   Patient Response to Interventions:  Engaged   Education Outcome:  In group clarification offered    Clinical Observations/Individualized Feedback: Pt attended and participated in group session. Pt was active and engaged throughout group. Pt was bright and appropriate during group session.    Plan: Continue to engage patient in RT group sessions 2-3x/week.   Alura Olveda-McCall,  LRT,CTRS 08/12/2023 12:17 PM

## 2023-08-12 NOTE — [facility] Group Notes (Signed)
 Spirituality Group Focus of discussion: Gratitude and Strength Awareness  Process: Following theoretical framework of group therapy of Chyrl Civatte and further informed by Rogerian and Relational Cultural Theory approaches, participants invited to name:  Sources of gratitude (internal>external) Articulate gratitude for self Name a personal strength/gift/skill Locate points of resonance among group members/engage the "here and now" Conclude with grounding/breathwork  Observations: Sherrick was an active participant in the discussion. He shared in ways that broadened the conversation and elicited peer connection.  Kathline Banbury L. Sophronia Simas, M.Div [phone]

## 2023-08-12 NOTE — Plan of Care (Signed)
   Problem: Education: Goal: Knowledge of [facility] Education information/materials will improve Outcome: Progressing Goal: Emotional status will improve Outcome: Progressing Goal: Mental status will improve Outcome: Progressing

## 2023-08-12 NOTE — Progress Notes (Signed)
   08/12/23 1102  Psych Admission Type (Psych Patients Only)  Admission Status Voluntary  Psychosocial Assessment  Patient Complaints Anxiety;Depression  Eye Contact Fair  Facial Expression Anxious  Affect Appropriate to circumstance  Speech Logical/coherent  Interaction Assertive  Motor Activity Fidgety  Appearance/Hygiene Unremarkable  Behavior Characteristics Cooperative  Mood Depressed;Anxious  Thought Process  Coherency WDL  Content Blaming others  Delusions None reported or observed  Perception WDL  Hallucination None reported or observed  Judgment Impaired  Confusion None  Danger to Self  Current suicidal ideation? Denies  Agreement Not to Harm Self Yes  Description of Agreement Verbal  Danger to Others  Danger to Others None reported or observed

## 2023-08-12 NOTE — [facility] Group Notes (Signed)
 [facility] Group Notes:  (Nursing/MHT/Case Management/Adjunct)  Date:  08/12/2023  Time:  10:27 PM  Type of Therapy:   NA Group  Participation Level:  Active  Participation Quality:  Appropriate  Affect:  Appropriate  Cognitive:  Appropriate  Insight:  Appropriate  Engagement in Group:  Engaged  Modes of Intervention:  Education  Summary of Progress/Problems: Attended NA meeting.   Raymond [name] 08/12/2023, 10:27 PM

## 2023-08-12 NOTE — Plan of Care (Signed)

## 2023-08-13 DIAGNOSIS — F332 Major depressive disorder, recurrent severe without psychotic features: Secondary | ICD-10-CM | POA: Diagnosis not present

## 2023-08-13 LAB — GLUCOSE, CAPILLARY: Glucose-Capillary: 130 mg/dL — ABNORMAL HIGH (ref 70–99)

## 2023-08-13 MED ORDER — VITAMIN D3 25 MCG PO TABS
1000.0000 [IU] | ORAL_TABLET | Freq: Every day | ORAL | Status: DC
  Administered 2023-08-13 – 2023-08-18 (×6): 1000 [IU] via ORAL
  Filled 2023-08-13 (×9): qty 1

## 2023-08-13 MED ORDER — PANTOPRAZOLE SODIUM 20 MG PO TBEC
20.0000 mg | DELAYED_RELEASE_TABLET | Freq: Every day | ORAL | Status: DC
  Administered 2023-08-13 – 2023-08-18 (×6): 20 mg via ORAL
  Filled 2023-08-13 (×9): qty 1

## 2023-08-13 NOTE — Group Note (Signed)
 LCSW Group Therapy Note   Group Date: 08/13/2023 Start Time: 1100 End Time: 1200  Participation: patient was present and actively participated in the discussion.    Type of Therapy:  Group Therapy  Title: "Healing Flames: Navigating Anger with Compassion"  Objective:  Foster self-awareness and promote compassion toward oneself and others when dealing with anger.  Goals:  Help participants understand the underlying emotions and needs fueling anger. Provide coping strategies for healthier emotional expression and anger management.  Summary: This session explored anger as a volcano--an explosion driven by deeper feelings and unmet needs. Participants learned to identify anger triggers and underlying emotions, then practiced coping strategies like deep breathing, physical activity, and journaling. The group discussed healthy ways to manage anger before it escalates, using both personal reflection and shared experiences.  Therapeutic Modalities: Cognitive Behavioral Therapy (CBT): Challenging thoughts that fuel anger. Mindfulness: Increasing awareness of emotions and sensations.   Raymond [name] Raymond [name], LCSWA 08/13/2023  1:03 PM

## 2023-08-13 NOTE — Group Note (Signed)
 Date:  08/13/2023 Time:  10:51 AM  Group Topic/Focus:  Goals Group:   The focus of this group is to help patients establish daily goals to achieve during treatment and discuss how the patient can incorporate goal setting into their daily lives to aide in recovery.    Participation Level:  Active  Participation Quality:  Appropriate  Affect:  Appropriate  Cognitive:  Appropriate  Insight: Appropriate  Engagement in Group:  Engaged  Modes of Intervention:  Discussion and Orientation  Additional Comments:  Goal is to take meds and work on depression  Azalee Course 08/13/2023, 10:51 AM

## 2023-08-13 NOTE — Progress Notes (Signed)
 [facility] MD Progress Note  08/13/2023 12:49 PM Raymond [name]  MRN:  [id number] Principal Problem: Major depressive disorder, recurrent severe without psychotic features (HCC) Diagnosis: Principal Problem:   Major depressive disorder, recurrent severe without psychotic features (HCC)  Total Time spent with patient: 45 minutes  Reason for admission:  Raymond [name] is a 43 year old Caucasian male with prior psychiatric history of anxiety, panic attacks, depression, PTSD, who presents voluntarily to [facility] from [facility] ED after stabilization for worsening depression, anxiety, panic attack, resulting in suicidal ideation by intentional overdosing on 90 pills of 500 mg Keppra and trazodone.   Chart review from last 24 hours: Chart reviewed and patient discussed at multidisciplinary team meeting. Blood pressure elevated at 127/110; down to 125/99 with a heart rate of 110 on recheck. Patient remains asymptomatic. Documented sleep hours: 8.0. He required as needed nicotine gum for nicotine dependence, and albuterol inhaler, according to the nursing record. Nursing reports persistent elevated blood pressures and tachycardia. He endorses visual hallucinations of shadows trying to talk to him, but he cannot understand what they are saying. He is compliant with routine psychotropic medication regimen.   The following medication adjustments were made yesterday:  - Decreased Risperdal from 3 mg to 2 mg daily. - increased Prozac from 40 mg to 60 mg daily.     Daily Evaluation: The patient seen face-to-face by this provider. On assessment today, Raymond [name] reports that his mood is "pretty good," overall.  He rates his depression as a 7/10 and anxiety as a 6/10, with 10 being the highest severity.   He reports experiencing insomnia and typically gets 4 to 5 hours of sleep at night with trazodone, attributing his sleep difficulties to racing thoughts. Reports appetite is improving,  though inconsistent; he did not eat lunch or dinner yesterday but had a small breakfast this morning, stating that his appetite depends on the type of food available. He also reports that some medications suppress his appetite. Concentration is without complaint.  Energy level is adequate. Denies having any suicidal thoughts. Denies having any suicidal intent and plan.  Denies having any HI.  Denies having any paranoia or current auditory hallucinations, though he reports prior to his overdose, he recalls hearing a voice in his head telling him, "life is not worth living.  You aind' got your mom.Take your life."  Reports visual hallucinations of shadowy figures without distinguishable faces. He states that these figures appear to be trying to communicate with him, but he cannot hear them.  He recalls first seeing them after his mother passed away from stage IV cancer on November 06, 2019.  He reports vomiting a small amount of blood yesterday after taking his medication but did not inform staff at the time. He was educated on the importance of reporting any further bleeding so staff can monitor.   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 He states his doctors are uncertain whether it is lung cancer or "just a tumor."  He reports that this diagnosis was made 3 months ago after he presented to the hospital following a seizure. He states he has a follow-up appointment regarding this condition in approximately 2-1/2 weeks, possibly in [place].   He reports attending and participating in group sessions on the unit and states that he particularly enjoyed the anger management group.  He states his daily goal is to ensure he receives the  correct medications without frequent changes, as he feels his body cannot tolerate medication adjustments.  We discussed psychosocial stressors, including his difficulty reaching his father as his calls go to  voicemail.  However, he states he was able to speak with one of his brothers.  Additionally, emotional support was provided and grief counseling was discussed in relation to his mother's passing.   Case staffed with the attending psychiatrist, N. Massengill. Lab results indicate slightly low vitamin D levels, and a vitamin D supplement will be started for replenishment. Will also initiate a proton pump inhibitor to address the reported hematemesis. The current psychiatric regimen will continue as no clinical changes are warranted today.   Discharge Planning: Continued hospitalization remains necessary to monitor depressive symptoms, assess response and tolerability to recent medication adjustments, and ensure further stabilization.    Past Psychiatric History: Previous Psych Diagnoses: Major depressive disorder, recurrent severe without psychotic features, amphetamine abuse, cocaine abuse, substance-induced mood disorder, with suicide attempt.  Prior inpatient treatment: Admitted to [facility] behavioral health in [place] in 2020 for suicide attempt for 1 month.  Current/prior outpatient treatment: None Prior rehab hx: Rehab for alcohol x 1 month in 2019 Psychotherapy hx: Yes History of suicide: X multiple attempts History of homicide or aggression: Denies Psychiatric medication history: Thorazine, fluoxetine, hydroxyzine, risperidone, and trazodone. Psychiatric medication compliance history: Noncompliance Neuromodulation history: Denies  Current Psychiatrist: Denies Current therapist: Seeing Graciella Freer at the [place]   Past Medical History:  Past Medical History:  Diagnosis Date   Alcohol abuse    Anxiety    Hepatitis C    PTSD (post-traumatic stress disorder)     Past Surgical History:  Procedure Laterality Date   FACIAL FRACTURE SURGERY     metal plate under right eye   Family History: History reviewed. No pertinent family history. Family Psychiatric  History:  See H&P Social History:  Social History   Substance and Sexual Activity  Alcohol Use Yes   Comment: 1 liter bottle of vodka prior to arrival     Social History   Substance and Sexual Activity  Drug Use Yes   Types: Methamphetamines    Social History   Socioeconomic History   Marital status: Single    Spouse name: Not on file   Number of children: Not on file   Years of education: Not on file   Highest education level: Not on file  Occupational History   Not on file  Tobacco Use   Smoking status: Every Day    Current packs/day: 1.00    Types: Cigarettes    Passive exposure: Current   Smokeless tobacco: Never  Vaping Use   Vaping status: Never Used  Substance and Sexual Activity   Alcohol use: Yes    Comment: 1 liter bottle of vodka prior to arrival   Drug use: Yes    Types: Methamphetamines   Sexual activity: Not Currently  Other Topics Concern   Not on file  Social History Narrative   Not on file   Social Drivers of Health   Financial Resource Strain: Not on File (08/18/2022)   Received from [facility] [employer] [facility]    Financial Resource Strain: 0  Food Insecurity: No Food Insecurity (08/09/2023)   Hunger Vital Sign    Worried About Running Out of Food in the Last Year: Never true    Ran Out of Food in the Last Year: Never true  Transportation Needs: No Transportation Needs (08/09/2023)   PRAPARE -  Administrator, Civil Service (Medical): No    Lack of Transportation (Non-Medical): No  Physical Activity: Not on File (08/18/2022)   Received from [place], [place]   Physical Activity    Physical Activity: 0  Stress: Not on File (08/18/2022)   Received from [facility], [place]   Stress    Stress: 0  Social Connections: Not on File (02/15/2023)   Received from [facility]   Social Connections    Connectedness: 0   Additional Social History:    Current Medications: Current Facility-Administered Medications  Medication Dose Route Frequency  Provider Last Rate Last Admin   albuterol (VENTOLIN HFA) 108 (90 Base) MCG/ACT inhaler 2 puff  2 puff Inhalation Q4H PRN Starleen Blue, NP   2 puff at 08/13/23 0741   alum & mag hydroxide-simeth (MAALOX/MYLANTA) 200-200-20 MG/5ML suspension 30 mL  30 mL Oral Q4H PRN Dahlia Byes C, NP       diphenhydrAMINE (BENADRYL) capsule 50 mg  50 mg Oral Q6H PRN Welford Roche, Josephine C, NP       Or   diphenhydrAMINE (BENADRYL) injection 50 mg  50 mg Intramuscular Q6H PRN Onuoha, Josephine C, NP       haloperidol (HALDOL) tablet 5 mg  5 mg Oral TID PRN Dahlia Byes C, NP       And   diphenhydrAMINE (BENADRYL) capsule 50 mg  50 mg Oral TID PRN Dahlia Byes C, NP       haloperidol lactate (HALDOL) injection 5 mg  5 mg Intramuscular TID PRN Dahlia Byes C, NP       And   diphenhydrAMINE (BENADRYL) injection 50 mg  50 mg Intramuscular TID PRN Dahlia Byes C, NP       And   LORazepam (ATIVAN) injection 2 mg  2 mg Intramuscular TID PRN Dahlia Byes C, NP       haloperidol lactate (HALDOL) injection 10 mg  10 mg Intramuscular TID PRN Dahlia Byes C, NP       And   diphenhydrAMINE (BENADRYL) injection 50 mg  50 mg Intramuscular TID PRN Dahlia Byes C, NP       And   LORazepam (ATIVAN) injection 2 mg  2 mg Intramuscular TID PRN Dahlia Byes C, NP       docusate sodium (COLACE) capsule 100 mg  100 mg Oral BID Massengill, Harrold Donath, MD   100 mg at 08/13/23 0741   FLUoxetine (PROZAC) capsule 60 mg  60 mg Oral Daily Massengill, Harrold Donath, MD   60 mg at 08/13/23 0741   levETIRAcetam (KEPPRA) tablet 750 mg  750 mg Oral BID Bobbitt, Shalon E, NP   750 mg at 08/13/23 0742   lisinopril (ZESTRIL) tablet 10 mg  10 mg Oral Daily Bobbitt, Shalon E, NP   10 mg at 08/13/23 0741   nicotine polacrilex (NICORETTE) gum 2 mg  2 mg Oral PRN Abbott Pao, Nadir, MD   2 mg at 08/13/23 0745   polyethylene glycol (MIRALAX / GLYCOLAX) packet 17 g  17 g Oral Daily Massengill, Harrold Donath, MD   17 g at 08/13/23 0742    risperiDONE (RISPERDAL) tablet 2 mg  2 mg Oral QHS Massengill, Nathan, MD   2 mg at 08/12/23 2119   traZODone (DESYREL) tablet 150 mg  150 mg Oral QHS Dahlia Byes C, NP   150 mg at 08/12/23 2119    Lab Results:  No results found for this or any previous visit (from the past 48 hours).  Blood Alcohol level:  Lab  Results  Component Value Date   ETH <10 08/08/2023   ETH 297 (H) 06/27/2023   Metabolic Disorder Labs: Lab Results  Component Value Date   HGBA1C 5.3 08/11/2023   MPG 105.41 08/11/2023   MPG 108.28 03/28/2023   Lab Results  Component Value Date   PROLACTIN 12.0 01/22/2023   PROLACTIN 19.5 11/20/2022   Lab Results  Component Value Date   CHOL 172 08/11/2023   TRIG 146 08/11/2023   HDL 53 08/11/2023   CHOLHDL 3.2 08/11/2023   VLDL 29 08/11/2023   LDLCALC 90 08/11/2023   LDLCALC 84 03/28/2023    Physical Findings: AIMS: Facial and Oral Movements Muscles of Facial Expression: None Lips and Perioral Area: None Jaw: None Tongue: None,Extremity Movements Upper (arms, wrists, hands, fingers): None Lower (legs, knees, ankles, toes): None, Trunk Movements Neck, shoulders, hips: None, Global Judgements Severity of abnormal movements overall : None Patient's awareness of abnormal movements:  (N/A), Dental Status Current problems with teeth and/or dentures?: No Does patient usually wear dentures?: No Edentia?: No  CIWA:   N/A COWS:  N/A   Musculoskeletal: Strength & Muscle Tone: within normal limits Gait & Station: normal Patient leans: N/A  Psychiatric Specialty Exam:  Presentation  General Appearance:  Casual; Fairly Groomed  Eye Contact: Good  Speech: Clear and Coherent  Speech Volume: Normal  Handedness: Right  Mood and Affect  Mood: -- ("Pretty Good.")  Affect: Congruent  Thought Process  Thought Processes: Coherent; Goal Directed; Linear  Descriptions of Associations:Intact  Orientation:Full (Time, Place and  Person)  Thought Content:Logical  History of Schizophrenia/Schizoaffective disorder:No  Duration of Psychotic Symptoms:No data recorded Hallucinations:Hallucinations: Visual Description of Visual Hallucinations: Reports seeing shadow figures.   Ideas of Reference:None  Suicidal Thoughts:Suicidal Thoughts: No   Homicidal Thoughts:Homicidal Thoughts: No   Sensorium  Memory: Immediate Good; Recent Good  Judgment: Fair  Insight: Fair  Art therapist  Concentration: Fair  Attention Span: Good  Recall: Good  Fund of Knowledge: Fair  Language: Good  Psychomotor Activity  Psychomotor Activity: Psychomotor Activity: Normal   Assets  Assets: Communication Skills; Desire for Improvement; Resilience  Sleep  Sleep: Sleep: Fair   Physical Exam: Physical Exam Vitals and nursing note reviewed.  Constitutional:      General: He is not in acute distress.    Appearance: He is not toxic-appearing.  HENT:     Head: Normocephalic.  Cardiovascular:     Pulses: Normal pulses.     Comments: Blood Pressure 125/99 Pulmonary:     Effort: No respiratory distress.  Abdominal:     Comments: Deferred  Genitourinary:    Comments: Deferred Neurological:     Mental Status: He is alert and oriented to person, place, and time.    Review of Systems  Constitutional: Negative.   Respiratory:  Negative for shortness of breath.   Cardiovascular:  Negative for chest pain and palpitations.  Gastrointestinal:  Negative for constipation, diarrhea, nausea and vomiting.  Skin: Negative.   Neurological:  Positive for seizures (Documented history of seizures). Negative for dizziness, tingling, tremors and headaches.  Endo/Heme/Allergies:        See allergy listing  Psychiatric/Behavioral:  Positive for depression and hallucinations. Negative for suicidal ideas. The patient is nervous/anxious. The patient does not have insomnia.    Blood pressure (!) 129/104, pulse 85,  temperature (!) 97.4 F (36.3 C), temperature source Oral, resp. rate 18, height 6\' 1"  (1.854 m), weight 77.2 kg, SpO2 97%. Body mass index is 22.46 kg/m.  Treatment Plan Summary: Daily contact with patient to assess and evaluate symptoms and progress in treatment and Medication management Physician Treatment Plan for Primary Diagnosis: Assessment: Raymond [name] is a 43 year old Caucasian male with prior psychiatric history of anxiety, panic attacks, depression, PTSD, who presents voluntarily to [facility] from [facility] ED after stabilization for worsening depression, anxiety, panic attack, resulting in suicidal ideation by intentional overdosing on 90 pills of 500 mg Keppra and trazodone.     Active Problems:   Major depressive disorder, recurrent severe without psychotic features (HCC) Generalized anxiety disorder PTSD Seizure disorder   Plans: Medications: -- Continue Prozac capsule 60 mg, oral, daily for depression and anxiety -- Continue Risperdal tablet 2 mg oral, daily at bedtime for psychosis --Continue trazodone 150 mg, oral, daily at bedtime for insomnia   Medications for other medical problems: -- Continue Keppra tablet 750 mg p.o. twice daily to prevent seizures -- Nicotine gum 2 mg p.o. at needed for smoking cessation -- Lorazepam tablet 2 mg every 6 hours as needed for anxiety, seizures -- Lisinopril Tablet 10 mg p.o. daily for high blood pressure -- Start vitamin D3 tablet 1000 units, oral, daily for vitamin D deficiency -- Start Protonix EC tablet 20 mg, oral, daily for GERD   Continue BH Agitation Protocol --Haldol 5 mg, oral, 3 times daily as needed, mild agitation --Benadryl 50 mg, oral, 3 times daily as needed, mild agitation                                     OR  --Haldol injection 5 mg, IM, 3 times daily as needed, moderate agitation --Benadryl injection 50 mg, IM, 3 times daily as needed, moderate agitation --Ativan  injection 2 mg, IM, 3 times daily as needed, moderate agitation                                     OR --Haldol injection 10 mg, IM, 3 times daily as needed, severe agitation --Benadryl injection 50 mg, IM, 3 times daily as needed, severe agitation --Ativan injection 2 mg, IM, 3 times daily as needed, severe agitation   --  The risks/benefits/side-effects/alternatives to this medication were discussed in detail with the patient and time was given for questions. The patient consents to medication trial.  -- FDA             -- Metabolic profile and EKG monitoring obtained while on an atypical antipsychotic (BMI: Lipid Panel: HbgA1c: QTc:)              -- Encouraged patient to participate in unit milieu and in scheduled group therapies    Other PRN Medications -Acetaminophen 650 mg every 6 as needed/mild pain -Maalox 30 mL oral every 4 as needed/digestion -Magnesium hydroxide 30 mL daily as needed/mild constipation   --The risks/benefits/side-effects/alternatives to this medication were discussed in detail with the patient and time was given for questions. The patient consents to medication trial.  -- Metabolic profile and EKG monitoring obtained while on an atypical antipsychotic (BMI: Lipid Panel: HbgA1c: QTc:)  -- Encouraged patient to participate in unit milieu and in scheduled group therapies    Admission labs reviewed: CMP: CO2 17 low, glucose 111 high, otherwise normal.  CBC: Within normal limits.  BAL: Less than 10.  UDS: Positive for marijuana  New lab orders reviewed Vitamin D low at 27.91 TSH, Lipid panel, Hemoglobin A1c all within defined ranges.   EKG reviewed: Sinus rhythm, ventricular rate 70, QT/QTc at bedtime 366/395   Safety and Monitoring: Voluntary admission to inpatient psychiatric unit for safety, stabilization and treatment Daily contact with patient to assess and evaluate symptoms and progress in treatment Patient's case to be discussed in multi-disciplinary team  meeting Observation Level : q15 minute checks Vital signs: q12 hours Precautions: suicide, but pt currently verbally contracts for safety on unit    Discharge Planning: Social work and case management to assist with discharge planning and identification of hospital follow-up needs prior to discharge Estimated LOS: 5-7 days Discharge Concerns: Need to establish a safety plan; Medication compliance and effectiveness Discharge Goals: Return home with outpatient referrals for mental health follow-up including medication management/psychotherapy.    -- Long Term Goals: Improvement in symptoms so as ready for discharge -- Short Term Goals: Ability to identify changes in lifestyle to reduce recurrence of condition will improve, Ability to verbalize feelings will improve, Ability to demonstrate self-control will improve, Ability to identify and develop effective coping behaviors will improve, Ability to maintain clinical measurements within normal limits will improve, Compliance with prescribed medications will improve, and Ability to identify triggers associated with substance abuse/mental health issues will improve Physician Treatment Plan for Secondary Diagnosis:  I certify that inpatient services furnished can reasonably be expected to improve the patient's condition.    Norma Fredrickson, NP 08/13/2023, 12:49 PM Patient ID: Raymond [name], male   DOB: 1980-12-01, 44 y.o.   MRN: [id number] Patient ID: Raymond [name], male   DOB: 25-Mar-1981, 43 y.o.   MRN: [id number]

## 2023-08-13 NOTE — Progress Notes (Signed)
 D: Patient is alert, oriented, pleasant, and cooperative. Patient endorses VH of shadows "they're trying to talk to me but I can't hear what they're saying". Denies SI, HI, AH, and verbally contracts for safety. Patient reports he slept fair last night with sleeping medication. Patient reports his appetite as good, energy level as normal, and concentration as good. Patient rates his depression 8/10, hopelessness 8/10, and anxiety 8/10.    A: Scheduled medications administered per MD order. PRN albuterol and nicotine gum administered. Support provided. Patient educated on safety on the unit and medications. Routine safety checks every 15 minutes. Patient stated understanding to tell nurse about any new physical symptoms. Patient understands to tell staff of any needs.     R: No adverse drug reactions noted. Patient verbally contracts for safety. Patient remains safe at this time and will continue to monitor.    08/13/23 1100  Psych Admission Type (Psych Patients Only)  Admission Status Voluntary  Psychosocial Assessment  Patient Complaints None  Eye Contact Fair  Facial Expression Anxious;Animated  Affect Appropriate to circumstance  Speech Logical/coherent  Interaction Assertive  Motor Activity Fidgety  Appearance/Hygiene Unremarkable  Behavior Characteristics Appropriate to situation;Cooperative  Mood Euthymic;Pleasant  Thought Process  Coherency WDL  Content WDL  Delusions None reported or observed  Perception Hallucinations  Hallucination Visual  Judgment Impaired  Confusion None  Danger to Self  Current suicidal ideation? Denies  Agreement Not to Harm Self Yes  Description of Agreement verbal  Danger to Others  Danger to Others None reported or observed

## 2023-08-13 NOTE — Plan of Care (Signed)

## 2023-08-13 NOTE — [facility] Suicide Risk Assessment (Signed)
 [facility] INPATIENT:  Family/Significant Other Suicide Prevention Education  Suicide Prevention Education:  Contact Attempts:  brother, Chaise Mahabir [phone], (name of family member/significant other) has been identified by the patient as the family member/significant other with whom the patient will be residing, and identified as the person(s) who will aid the patient in the event of a mental health crisis.  With written consent from the patient, two attempts were made to provide suicide prevention education, prior to and/or following the patient's discharge.  We were unsuccessful in providing suicide prevention education.  A suicide education pamphlet was given to the patient to share with family/significant other.  Date and time of first attempt: 3/12 @ 1450 LVM Date and time of second attempt: 3/13 @ 1240 LVM  Kathi Der 08/13/2023, 12:39 PM

## 2023-08-13 NOTE — [facility] Group Notes (Signed)
 [facility] Group Notes:  (Nursing/MHT/Case Management/Adjunct)  Date:  08/13/2023  Time:  10:05 PM  Type of Therapy:  Psychoeducational Skills  Participation Level:  Active  Participation Quality:  Appropriate  Affect:  Appropriate  Cognitive:  Appropriate  Insight:  Improving  Engagement in Group:  Improving  Modes of Intervention:  Education  Summary of Progress/Problems: The patient rated his day as a 4 out of 10 and then his day improved to a 10 out of 10. He claims that he had a seizure last evening,but he never reported it to the staff. His goal for tomorrow is to speak with the M.D. regarding his medication.   Raymond [name] 08/13/2023, 10:05 PM

## 2023-08-14 ENCOUNTER — Encounter (HOSPITAL_COMMUNITY): Payer: Self-pay

## 2023-08-14 DIAGNOSIS — F332 Major depressive disorder, recurrent severe without psychotic features: Secondary | ICD-10-CM | POA: Diagnosis not present

## 2023-08-14 NOTE — Group Note (Signed)
 Date:  08/14/2023 Time:  1:17 PM  Group Topic/Focus:  Managing Feelings:   The focus of this group is to identify what feelings patients have difficulty handling and develop a plan to handle them in a healthier way upon discharge. Primary and Secondary Emotions:   The focus of this group is to discuss the difference between primary and secondary emotions.    Participation Level:  Active  Participation Quality:  Appropriate  Affect:  Appropriate  Cognitive:  Appropriate  Insight: Appropriate  Engagement in Group:  Engaged  Modes of Intervention:  Activity and Education  Additional Comments:   Pt attended and actively participated in the Wellness group.  Edmund Hilda Bellarae Lizer 08/14/2023, 1:17 PM

## 2023-08-14 NOTE — Plan of Care (Signed)
   Problem: Education: Goal: Knowledge of [facility] Education information/materials will improve Outcome: Progressing Goal: Emotional status will improve Outcome: Progressing Goal: Mental status will improve Outcome: Progressing Goal: Verbalization of understanding the information provided will improve Outcome: Progressing

## 2023-08-14 NOTE — Progress Notes (Signed)
  Raymond [name]  Spoke with pt regarding apartment concerns. Pt reports he is returning to his apartment at discharge and that maintenance is already aware of the issues that need to be fixed. Pt reports he will call his landlord again this morning regarding this to ensure it will be fixed by the time he is discharged.   Signed:  Traves Majchrzak, LCSW-A 08/14/2023  10:28 AM

## 2023-08-14 NOTE — Progress Notes (Signed)
   08/14/23 0200  Psych Admission Type (Psych Patients Only)  Admission Status Voluntary  Psychosocial Assessment  Patient Complaints None;Anxiety  Eye Contact Fair  Facial Expression Anxious  Affect Appropriate to circumstance  Speech Logical/coherent  Interaction Assertive  Motor Activity Fidgety  Appearance/Hygiene Unremarkable  Behavior Characteristics Appropriate to situation  Mood Euthymic  Thought Process  Coherency WDL  Content WDL  Delusions None reported or observed  Perception Hallucinations  Hallucination Visual  Judgment Impaired  Confusion None  Danger to Self  Current suicidal ideation? Denies (Denies)  Agreement Not to Harm Self Yes  Description of Agreement verbal  Danger to Others  Danger to Others None reported or observed

## 2023-08-14 NOTE — BH IP Treatment Plan (Signed)
 Interdisciplinary Treatment and Diagnostic Plan Update  08/14/2023 Time of Session: 11:40 AM - Raymond [name] MRN: [id number]  Principal Diagnosis: Major depressive disorder, recurrent severe without psychotic features (HCC)  Secondary Diagnoses: Principal Problem:   Major depressive disorder, recurrent severe without psychotic features (HCC)   Current Medications:  Current Facility-Administered Medications  Medication Dose Route Frequency Provider Last Rate Last Admin   albuterol (VENTOLIN HFA) 108 (90 Base) MCG/ACT inhaler 2 puff  2 puff Inhalation Q4H PRN Starleen Blue, NP   2 puff at 08/14/23 0801   alum & mag hydroxide-simeth (MAALOX/MYLANTA) 200-200-20 MG/5ML suspension 30 mL  30 mL Oral Q4H PRN Dahlia Byes C, NP       diphenhydrAMINE (BENADRYL) capsule 50 mg  50 mg Oral Q6H PRN Welford Roche, Josephine C, NP       Or   diphenhydrAMINE (BENADRYL) injection 50 mg  50 mg Intramuscular Q6H PRN Onuoha, Josephine C, NP       haloperidol (HALDOL) tablet 5 mg  5 mg Oral TID PRN Dahlia Byes C, NP       And   diphenhydrAMINE (BENADRYL) capsule 50 mg  50 mg Oral TID PRN Dahlia Byes C, NP       haloperidol lactate (HALDOL) injection 5 mg  5 mg Intramuscular TID PRN Dahlia Byes C, NP       And   diphenhydrAMINE (BENADRYL) injection 50 mg  50 mg Intramuscular TID PRN Dahlia Byes C, NP       And   LORazepam (ATIVAN) injection 2 mg  2 mg Intramuscular TID PRN Dahlia Byes C, NP       haloperidol lactate (HALDOL) injection 10 mg  10 mg Intramuscular TID PRN Dahlia Byes C, NP       And   diphenhydrAMINE (BENADRYL) injection 50 mg  50 mg Intramuscular TID PRN Dahlia Byes C, NP       And   LORazepam (ATIVAN) injection 2 mg  2 mg Intramuscular TID PRN Dahlia Byes C, NP       docusate sodium (COLACE) capsule 100 mg  100 mg Oral BID Massengill, Harrold Donath, MD   100 mg at 08/14/23 0803   FLUoxetine (PROZAC) capsule 60 mg  60 mg Oral Daily Massengill,  Harrold Donath, MD   60 mg at 08/14/23 0801   levETIRAcetam (KEPPRA) tablet 750 mg  750 mg Oral BID Bobbitt, Shalon E, NP   750 mg at 08/14/23 0802   lisinopril (ZESTRIL) tablet 10 mg  10 mg Oral Daily Bobbitt, Shalon E, NP   10 mg at 08/14/23 2956   nicotine polacrilex (NICORETTE) gum 2 mg  2 mg Oral PRN Abbott Pao, Nadir, MD   2 mg at 08/13/23 1428   pantoprazole (PROTONIX) EC tablet 20 mg  20 mg Oral Daily Bennett, Christal H, NP   20 mg at 08/14/23 0802   polyethylene glycol (MIRALAX / GLYCOLAX) packet 17 g  17 g Oral Daily Massengill, Harrold Donath, MD   17 g at 08/14/23 0803   risperiDONE (RISPERDAL) tablet 2 mg  2 mg Oral QHS Massengill, Nathan, MD   2 mg at 08/13/23 2201   traZODone (DESYREL) tablet 150 mg  150 mg Oral QHS Onuoha, Josephine C, NP   150 mg at 08/13/23 2201   vitamin D3 (CHOLECALCIFEROL) tablet 1,000 Units  1,000 Units Oral Daily Bennett, Christal H, NP   1,000 Units at 08/14/23 0801   PTA Medications: Medications Prior to Admission  Medication Sig Dispense Refill Last Dose/Taking   FLUoxetine (  PROZAC) 40 MG capsule Take 1 capsule (40 mg total) by mouth daily. 30 capsule 0 Past Month   gabapentin (NEURONTIN) 100 MG capsule Take 1 capsule (100 mg total) by mouth 3 (three) times daily. 90 capsule 1 Past Month   hydrOXYzine (ATARAX) 50 MG tablet Take 1 tablet (50 mg total) by mouth every 6 (six) hours as needed for anxiety. 10 tablet 0 Past Month   levETIRAcetam (KEPPRA) 750 MG tablet Take 1 tablet (750 mg total) by mouth every 12 (twelve) hours. 60 tablet 0 Past Month   Tiotropium Bromide-Olodaterol 2.5-2.5 MCG/ACT AERS Inhale 2 puffs into the lungs daily.   Past Month   albuterol (VENTOLIN HFA) 108 (90 Base) MCG/ACT inhaler Inhale 2 puffs into the lungs every 4 (four) hours as needed for wheezing or shortness of breath. 6.7 g 0    lisinopril (ZESTRIL) 10 MG tablet Take 1 tablet (10 mg total) by mouth daily. 10 tablet 0    risperiDONE (RISPERDAL) 2 MG tablet Take 1 tablet (2 mg total) by mouth  at bedtime. (Patient taking differently: Take 3 mg by mouth at bedtime.) 60 tablet 0    traZODone (DESYREL) 150 MG tablet Take 150 mg by mouth at bedtime as needed for sleep.       Patient Stressors:    Patient Strengths:    Treatment Modalities: Medication Management, Group therapy, Case management,  1 to 1 session with clinician, Psychoeducation, Recreational therapy.   Physician Treatment Plan for Primary Diagnosis: Major depressive disorder, recurrent severe without psychotic features (HCC) Long Term Goal(s):     Short Term Goals:    Medication Management: Evaluate patient's response, side effects, and tolerance of medication regimen.  Therapeutic Interventions: 1 to 1 sessions, Unit Group sessions and Medication administration.  Evaluation of Outcomes: Progressing  Physician Treatment Plan for Secondary Diagnosis: Principal Problem:   Major depressive disorder, recurrent severe without psychotic features (HCC)  Long Term Goal(s):     Short Term Goals:       Medication Management: Evaluate patient's response, side effects, and tolerance of medication regimen.  Therapeutic Interventions: 1 to 1 sessions, Unit Group sessions and Medication administration.  Evaluation of Outcomes: Progressing   RN Treatment Plan for Primary Diagnosis: Major depressive disorder, recurrent severe without psychotic features (HCC) Long Term Goal(s): Knowledge of disease and therapeutic regimen to maintain health will improve  Short Term Goals: Ability to remain free from injury will improve, Ability to verbalize frustration and anger appropriately will improve, Ability to demonstrate self-control, Ability to participate in decision making will improve, Ability to verbalize feelings will improve, Ability to disclose and discuss suicidal ideas, Ability to identify and develop effective coping behaviors will improve, and Compliance with prescribed medications will improve  Medication Management:  RN will administer medications as ordered by provider, will assess and evaluate patient's response and provide education to patient for prescribed medication. RN will report any adverse and/or side effects to prescribing provider.  Therapeutic Interventions: 1 on 1 counseling sessions, Psychoeducation, Medication administration, Evaluate responses to treatment, Monitor vital signs and CBGs as ordered, Perform/monitor CIWA, COWS, AIMS and Fall Risk screenings as ordered, Perform wound care treatments as ordered.  Evaluation of Outcomes: Progressing   LCSW Treatment Plan for Primary Diagnosis: Major depressive disorder, recurrent severe without psychotic features (HCC) Long Term Goal(s): Safe transition to appropriate next level of care at discharge, Engage patient in therapeutic group addressing interpersonal concerns.  Short Term Goals: Engage patient in aftercare planning with referrals and resources,  Increase social support, Increase ability to appropriately verbalize feelings, Increase emotional regulation, Facilitate acceptance of mental health diagnosis and concerns, Facilitate patient progression through stages of change regarding substance use diagnoses and concerns, Identify triggers associated with mental health/substance abuse issues, and Increase skills for wellness and recovery  Therapeutic Interventions: Assess for all discharge needs, 1 to 1 time with Social worker, Explore available resources and support systems, Assess for adequacy in community support network, Educate family and significant other(s) on suicide prevention, Complete Psychosocial Assessment, Interpersonal group therapy.  Evaluation of Outcomes: Progressing   Progress in Treatment: Attending groups: Yes. Participating in groups: Yes. Taking medication as prescribed: Yes. Toleration medication: Yes. Family/Significant other contact made: No, will contact:  Navin Dogan (brother) [phone] Patient understands  diagnosis: Yes. Discussing patient identified problems/goals with staff: Yes. Medical problems stabilized or resolved: Yes. Denies suicidal/homicidal ideation: Yes. Issues/concerns per patient self-inventory: No  New problem(s) identified: No   New Short Term/Long Term Goal(s): detox, medication management for mood stabilization; elimination of SI thoughts; development of comprehensive mental wellness/sobriety plan   Patient Goals:  "I'm trying to better myself"   Discharge Plan or Barriers: Patient recently admitted. CSW will continue to follow and assess for appropriate referrals and possible discharge planning.    Reason for Continuation of Hospitalization: Anxiety Depression Medication stabilization   Estimated Length of Stay: 3 - 4 days  Last 3 [place] Suicide Severity Risk Score: Flowsheet Row Admission (Current) [facility] from 08/08/2023 in [facility] Department at [facility] ED from 06/27/2023 in [facility] Department at [facility]  C-SSRS RISK CATEGORY High Risk High Risk No Risk       Last [facility] 2/9 Scores:     No data to display          Scribe for Treatment Team: Alla Feeling, LCSWA 08/14/2023 2:50 PM

## 2023-08-14 NOTE — Group Note (Signed)
 Recreation Therapy Group Note   Group Topic:Team Building  Group Date: 08/14/2023 Start Time: 0930 End Time: 1000 Facilitators: Adrieana Fennelly-McCall, LRT,CTRS Location: [address] Dayroom   Group Topic: Communication, Team Building, Problem Solving  Goal Area(s) Addresses:  Patient will effectively work with peer towards shared goal.  Patient will identify skills used to make activity successful.  Patient will identify how skills used during activity can be applied to reach post d/c goals.   Intervention: STEM Activity- Glass blower/designer  Activity: Tallest [employer]. In teams of 5-6, patients were given 11 craft pipe cleaners. Using the materials provided, patients were instructed to compete again the opposing team(s) to build the tallest free-standing structure from floor level. The activity was timed; difficulty increased by Clinical research associate as Production designer, theatre/television/film continued.  Systematically resources were removed with additional directions for example, placing one arm behind their back, working in silence, and shape stipulations. LRT facilitated post-activity discussion reviewing team processes and necessary communication skills involved in completion. Patients were encouraged to reflect how the skills utilized, or not utilized, in this activity can be incorporated to positively impact support systems post discharge.  Education: Pharmacist, community, Scientist, physiological, Discharge Planning   Education Outcome: Acknowledges education/In group clarification offered/Needs additional education.   Affect/Mood: Appropriate   Participation Level: Engaged   Participation Quality: Independent   Behavior: Appropriate   Speech/Thought Process: Focused   Insight: Good   Judgement: Good   Modes of Intervention: STEM Activity   Patient Response to Interventions:  Engaged   Education Outcome:  In group clarification offered    Clinical Observations/Individualized Feedback: Pt was bright and  engaged. Pt vocal in helping group identify what they could do to make their tower. Pt also had a hard time following along with the obstacles that were introduced during the activity. Pt was able to do them long enough for his group to successfully complete the activity.     Plan: Continue to engage patient in RT group sessions 2-3x/week.   Maebry Obrien-McCall, LRT,CTRS 08/14/2023 12:59 PM

## 2023-08-14 NOTE — Group Note (Signed)
 Date:  08/14/2023 Time:  1:40 PM  Group Topic/Focus:  Managing Feelings:   The focus of this group is to identify what feelings patients have difficulty handling and develop a plan to handle them in a healthier way upon discharge.    Participation Level:  Active  Participation Quality:  Appropriate  Affect:  Appropriate  Cognitive:  Appropriate  Insight: Appropriate  Engagement in Group:  Engaged  Modes of Intervention:  Additional Comments:   Did not attend  Stark Bray 08/14/2023, 1:40 PM

## 2023-08-14 NOTE — Group Note (Signed)
 Date:  08/14/2023 Time:  11:09 PM  Group Topic/Focus:  Wrap-Up Group:   The focus of this group is to help patients review their daily goal of treatment and discuss progress on daily workbooks.    Additional Comments:  Pt attended and participated in the AA meeting this evening.  Chrisandra Netters 08/14/2023, 11:09 PM

## 2023-08-14 NOTE — Progress Notes (Addendum)
   08/14/23 1545  Spiritual Encounters  Type of Visit Follow up  Care provided to: Patient  Spiritual Framework  Presenting Themes Other (comment) (grief)   Per patient request, I met with Raymond [name] for prayer and support. He did not remember making the request but said he did welcome prayer. [name] debriefed with me around losses of his mother and experiencing miscarriages that were disappointing as he still does not have children. He does have a faith outlook that is meaningful and a source of strength and hope.  I provided compassionate presence and listening. I supported [name] in processing his grief. I invited some reframing around how he might fulfill a sense of parentlng through nieces and nephews although his family is geographically separate. I provided prayer at his request,  Will continue to follow.   Raymond [name], M.Div [phone]

## 2023-08-14 NOTE — Group Note (Signed)
 Date:  08/14/2023 Time:  11:55 AM  Group Topic/Focus:  Goals Group:   The focus of this group is to help patients establish daily goals to achieve during treatment and discuss how the patient can incorporate goal setting into their daily lives to aide in recovery. Orientation:   The focus of this group is to educate the patient on the purpose and policies of crisis stabilization and provide a format to answer questions about their admission.  The group details unit policies and expectations of patients while admitted.    Participation Level:  Active  Participation Quality:  Appropriate  Affect:  Appropriate  Cognitive:  Appropriate  Insight: Appropriate  Engagement in Group:  Engaged  Modes of Intervention:  Activity, Discussion, and Orientation  Additional Comments:   Pt was attentive during Orientation. Pt completed the goals activity.   Edmund Hilda Erek Kowal 08/14/2023, 11:55 AM

## 2023-08-14 NOTE — Plan of Care (Signed)
 Pt presents with animated affect and euthymic mood. Reports good sleep and appetite. Denies SI, HI, and pain. Pt endorses visual hallucinations that he describes as "shadow people." Rates anxiety and depression 8/10. Pt attended and participated in all groups and was observed in the milieu throughout the shift socializing with peers. Cooperative in interactions with staff. Medication compliant with no adverse reactions. Safety checks maintained at q 15 minutes. Support, encouragement, and reassurance offered to the pt.   Problem: Education: Goal: Emotional status will improve Outcome: Progressing Goal: Mental status will improve Outcome: Progressing Goal: Verbalization of understanding the information provided will improve Outcome: Progressing   Problem: Activity: Goal: Interest or engagement in activities will improve Outcome: Progressing   Problem: Safety: Goal: Periods of time without injury will increase Outcome: Progressing

## 2023-08-14 NOTE — Progress Notes (Signed)
 [facility] MD Progress Note  08/14/2023 2:41 PM Raymond [name]  MRN:  [id number] Principal Problem: Major depressive disorder, recurrent severe without psychotic features (HCC) Diagnosis: Principal Problem:   Major depressive disorder, recurrent severe without psychotic features (HCC)  Total Time spent with patient: 45 minutes  Reason for admission:  Raymond [name] is a 43 year old Caucasian male with prior psychiatric history of anxiety, panic attacks, depression, PTSD, who presents voluntarily to [facility] from [facility] ED after stabilization for worsening depression, anxiety, panic attack, resulting in suicidal ideation by intentional overdosing on 90 pills of 500 mg Keppra and trazodone.   Chart review from last 24 hours: Chart reviewed and patient discussed at multidisciplinary team meeting. Blood pressure elevated at 148/98; nursing to recheck. Patient remains asymptomatic. Documented sleep hours: 7.5. He required as needed nicotine gum for nicotine dependence, and albuterol inhaler, according to the nursing record. Nursing reports he continues to endorse visual hallucinations of shadow people. He reported having a seizure last night that lasted 2 minutes. He is compliant with routine psychotropic medication regimen.     Daily Evaluation: The patient seen face-to-face by this provider. On assessment today, the pt reports that their mood is euthymic, improved since admission, and stable. Denies feeling down, depressed, or sad.  Reports that anxiety symptoms are at manageable level.  Sleep is stable. Appetite is stable.  Concentration is without complaint.  Energy level is adequate. Denies having any suicidal thoughts. Denies having any suicidal intent and plan.  Denies having any HI.  Denies having any paranoia or current auditory hallucinations.  Reports continued visual hallucinations of shadow people. He states, "I think my mom tries to talk to me."  He  states he is unable to see their face, only their body shapes.  Denies having side effects to current psychiatric medications.    Airon reports having a seizure at 11:00 PM last night.  He states that he did not bite his tongue but experienced shaking and was unable to speak.  He also reported feeling slightly dizzy and nauseous afterward. The patient expressed concern that his current dose of Keppra is not effective, stating he had 2 seizures this week-the first occurring on the second day of his stay and the second last night. He was educated on the importance of reporting any further seizure-like activity so staff can monitor.    Reports continued participation in group sessions on the unit and identifies his daily goal as improving his condition, with the focus on avoiding suicidal thoughts and further seizures.   We discussed psychosocial stressors and reviewed coping strategies for managing suicidal thoughts. Also emphasized that, after discharge, he should contact the crisis center or go to the nearest emergency department if such thoughts occur. Additionally, emotional support was provided and grief counseling was discussed in relation to his mother's passing. Hung verbalized understanding.   Case staffed with the attending psychiatrist, N. Massengill. The current psychiatric regimen will continue as no clinical changes are warranted today.   Discharge Planning: The projected date is set for March 15.   Past Psychiatric History: Previous Psych Diagnoses: Major depressive disorder, recurrent severe without psychotic features, amphetamine abuse, cocaine abuse, substance-induced mood disorder, with suicide attempt.  Prior inpatient treatment: Admitted to [facility] behavioral health in [place] in 2020 for suicide attempt for 1 month.  Current/prior outpatient treatment: None Prior rehab hx: Rehab for alcohol x 1 month in 2019 Psychotherapy hx: Yes History of suicide: X  multiple attempts  History of homicide or aggression: Denies Psychiatric medication history: Thorazine, fluoxetine, hydroxyzine, risperidone, and trazodone. Psychiatric medication compliance history: Noncompliance Neuromodulation history: Denies  Current Psychiatrist: Denies Current therapist: Seeing Graciella Freer at the [place]   Past Medical History:  Past Medical History:  Diagnosis Date   Alcohol abuse    Anxiety    Hepatitis C    PTSD (post-traumatic stress disorder)     Past Surgical History:  Procedure Laterality Date   FACIAL FRACTURE SURGERY     metal plate under right eye   Family History: History reviewed. No pertinent family history. Family Psychiatric  History: See H&P Social History:  Social History   Substance and Sexual Activity  Alcohol Use Yes   Comment: 1 liter bottle of vodka prior to arrival     Social History   Substance and Sexual Activity  Drug Use Yes   Types: Methamphetamines    Social History   Socioeconomic History   Marital status: Single    Spouse name: Not on file   Number of children: Not on file   Years of education: Not on file   Highest education level: Not on file  Occupational History   Not on file  Tobacco Use   Smoking status: Every Day    Current packs/day: 1.00    Types: Cigarettes    Passive exposure: Current   Smokeless tobacco: Never  Vaping Use   Vaping status: Never Used  Substance and Sexual Activity   Alcohol use: Yes    Comment: 1 liter bottle of vodka prior to arrival   Drug use: Yes    Types: Methamphetamines   Sexual activity: Not Currently  Other Topics Concern   Not on file  Social History Narrative   Not on file   Social Drivers of Health   Financial Resource Strain: Not on File (08/18/2022)   Received from [employer] [employer]    Financial Resource Strain: 0  Food Insecurity: No Food Insecurity (08/09/2023)   Hunger Vital Sign    Worried About Running Out of Food in the  Last Year: Never true    Ran Out of Food in the Last Year: Never true  Transportation Needs: No Transportation Needs (08/09/2023)   PRAPARE - Administrator, Civil Service (Medical): No    Lack of Transportation (Non-Medical): No  Physical Activity: Not on File (08/18/2022)   Received from [place], [place]   Physical Activity    Physical Activity: 0  Stress: Not on File (08/18/2022)   Received from [facility], [place]   Stress    Stress: 0  Social Connections: Not on File (02/15/2023)   Received from [facility]   Social Connections    Connectedness: 0   Additional Social History:    Current Medications: Current Facility-Administered Medications  Medication Dose Route Frequency Provider Last Rate Last Admin   albuterol (VENTOLIN HFA) 108 (90 Base) MCG/ACT inhaler 2 puff  2 puff Inhalation Q4H PRN Starleen Blue, NP   2 puff at 08/14/23 0801   alum & mag hydroxide-simeth (MAALOX/MYLANTA) 200-200-20 MG/5ML suspension 30 mL  30 mL Oral Q4H PRN Onuoha, Josephine C, NP       diphenhydrAMINE (BENADRYL) capsule 50 mg  50 mg Oral Q6H PRN Onuoha, Josephine C, NP       Or   diphenhydrAMINE (BENADRYL) injection 50 mg  50 mg Intramuscular Q6H PRN Onuoha, Josephine C, NP       haloperidol (HALDOL) tablet 5 mg  5 mg Oral TID PRN Dahlia Byes C, NP       And   diphenhydrAMINE (BENADRYL) capsule 50 mg  50 mg Oral TID PRN Dahlia Byes C, NP       haloperidol lactate (HALDOL) injection 5 mg  5 mg Intramuscular TID PRN Dahlia Byes C, NP       And   diphenhydrAMINE (BENADRYL) injection 50 mg  50 mg Intramuscular TID PRN Dahlia Byes C, NP       And   LORazepam (ATIVAN) injection 2 mg  2 mg Intramuscular TID PRN Dahlia Byes C, NP       haloperidol lactate (HALDOL) injection 10 mg  10 mg Intramuscular TID PRN Dahlia Byes C, NP       And   diphenhydrAMINE (BENADRYL) injection 50 mg  50 mg Intramuscular TID PRN Dahlia Byes C, NP       And   LORazepam (ATIVAN) injection 2  mg  2 mg Intramuscular TID PRN Dahlia Byes C, NP       docusate sodium (COLACE) capsule 100 mg  100 mg Oral BID Massengill, Harrold Donath, MD   100 mg at 08/14/23 0803   FLUoxetine (PROZAC) capsule 60 mg  60 mg Oral Daily Massengill, Harrold Donath, MD   60 mg at 08/14/23 0801   levETIRAcetam (KEPPRA) tablet 750 mg  750 mg Oral BID Bobbitt, Shalon E, NP   750 mg at 08/14/23 0802   lisinopril (ZESTRIL) tablet 10 mg  10 mg Oral Daily Bobbitt, Shalon E, NP   10 mg at 08/14/23 1610   nicotine polacrilex (NICORETTE) gum 2 mg  2 mg Oral PRN Abbott Pao, Nadir, MD   2 mg at 08/13/23 1428   pantoprazole (PROTONIX) EC tablet 20 mg  20 mg Oral Daily Jaela Yepez H, NP   20 mg at 08/14/23 0802   polyethylene glycol (MIRALAX / GLYCOLAX) packet 17 g  17 g Oral Daily Massengill, Harrold Donath, MD   17 g at 08/14/23 0803   risperiDONE (RISPERDAL) tablet 2 mg  2 mg Oral QHS Massengill, Nathan, MD   2 mg at 08/13/23 2201   traZODone (DESYREL) tablet 150 mg  150 mg Oral QHS Onuoha, Josephine C, NP   150 mg at 08/13/23 2201   vitamin D3 (CHOLECALCIFEROL) tablet 1,000 Units  1,000 Units Oral Daily Rajean Desantiago H, NP   1,000 Units at 08/14/23 9604    Lab Results:  Results for orders placed or performed during the hospital encounter of 08/09/23 (from the past 48 hours)  Glucose, capillary     Status: Abnormal   Collection Time: 08/13/23  9:07 PM  Result Value Ref Range   Glucose-Capillary 130 (H) 70 - 99 mg/dL    Comment: Glucose reference range applies only to samples taken after fasting for at least 8 hours.   Comment 1 Notify RN    Comment 2 Document in Chart     Blood Alcohol level:  Lab Results  Component Value Date   ETH <10 08/08/2023   ETH 297 (H) 06/27/2023   Metabolic Disorder Labs: Lab Results  Component Value Date   HGBA1C 5.3 08/11/2023   MPG 105.41 08/11/2023   MPG 108.28 03/28/2023   Lab Results  Component Value Date   PROLACTIN 12.0 01/22/2023   PROLACTIN 19.5 11/20/2022   Lab Results   Component Value Date   CHOL 172 08/11/2023   TRIG 146 08/11/2023   HDL 53 08/11/2023   CHOLHDL 3.2 08/11/2023   VLDL 29 08/11/2023  LDLCALC 90 08/11/2023   LDLCALC 84 03/28/2023    Physical Findings: AIMS: Facial and Oral Movements Muscles of Facial Expression: None Lips and Perioral Area: None Jaw: None Tongue: None,Extremity Movements Upper (arms, wrists, hands, fingers): None Lower (legs, knees, ankles, toes): None, Trunk Movements Neck, shoulders, hips: None, Global Judgements Severity of abnormal movements overall : None Patient's awareness of abnormal movements:  (N/A), Dental Status Current problems with teeth and/or dentures?: No Does patient usually wear dentures?: No Edentia?: No  CIWA:   N/A COWS:  N/A   Musculoskeletal: Strength & Muscle Tone: within normal limits Gait & Station: normal Patient leans: N/A  Psychiatric Specialty Exam:  Presentation  General Appearance:  Casual; Fairly Groomed  Eye Contact: Good  Speech: Clear and Coherent; Normal Rate  Speech Volume: Normal  Handedness: Right  Mood and Affect  Mood: Euthymic  Affect: Congruent  Thought Process  Thought Processes: Coherent; Goal Directed; Linear  Descriptions of Associations:Intact  Orientation:Full (Time, Place and Person)  Thought Content:Logical  History of Schizophrenia/Schizoaffective disorder:No  Duration of Psychotic Symptoms:No data recorded Hallucinations:Hallucinations: Visual Description of Visual Hallucinations: Reports seeing shadow people, no face, only body shapes.   Ideas of Reference:None  Suicidal Thoughts:Suicidal Thoughts: No   Homicidal Thoughts:Homicidal Thoughts: No   Sensorium  Memory: Immediate Good; Recent Good  Judgment: Fair  Insight: Fair  Art therapist  Concentration: Fair  Attention Span: Good  Recall: Good  Fund of Knowledge: Fair  Language: Good  Psychomotor Activity  Psychomotor  Activity: Psychomotor Activity: Normal   Assets  Assets: Communication Skills; Desire for Improvement; Resilience; Social Support  Sleep  Sleep: Sleep: Good   Physical Exam: Physical Exam Vitals and nursing note reviewed.  Constitutional:      General: He is not in acute distress.    Appearance: He is not toxic-appearing.  HENT:     Head: Normocephalic.  Cardiovascular:     Pulses: Normal pulses.     Comments: Blood Pressure 125/99 Pulmonary:     Effort: No respiratory distress.  Abdominal:     Comments: Deferred  Genitourinary:    Comments: Deferred Neurological:     Mental Status: He is alert and oriented to person, place, and time.    Review of Systems  Constitutional: Negative.   Respiratory:  Negative for shortness of breath.   Cardiovascular:  Negative for chest pain and palpitations.  Gastrointestinal:  Negative for constipation, diarrhea, nausea and vomiting.  Skin: Negative.   Neurological:  Positive for seizures (Documented history of seizures). Negative for dizziness, tingling, tremors and headaches.  Endo/Heme/Allergies:        See allergy listing  Psychiatric/Behavioral:  Positive for depression and hallucinations. Negative for suicidal ideas. The patient is nervous/anxious. The patient does not have insomnia.    Blood pressure (!) 148/98, pulse (!) 106, temperature (!) 97.4 F (36.3 C), temperature source Oral, resp. rate 18, height 6\' 1"  (1.854 m), weight 77.2 kg, SpO2 97%. Body mass index is 22.46 kg/m.  Treatment Plan Summary: Daily contact with patient to assess and evaluate symptoms and progress in treatment and Medication management Physician Treatment Plan for Primary Diagnosis: Assessment: Bhargav Barbaro is a 43 year old Caucasian male with prior psychiatric history of anxiety, panic attacks, depression, PTSD, who presents voluntarily to [facility] from [facility] ED after stabilization for worsening  depression, anxiety, panic attack, resulting in suicidal ideation by intentional overdosing on 90 pills of 500 mg Keppra and trazodone.     Active Problems:  Major depressive disorder, recurrent severe without psychotic features (HCC) Generalized anxiety disorder PTSD Seizure disorder   Plans: Medications: -- Continue Prozac capsule 60 mg, oral, daily for depression and anxiety -- Continue Risperdal tablet 2 mg oral, daily at bedtime for psychosis --Continue trazodone 150 mg, oral, daily at bedtime for insomnia   Medications for other medical problems: -- Continue Keppra tablet 750 mg p.o. twice daily to prevent seizures -- Nicotine gum 2 mg p.o. at needed for smoking cessation -- Lorazepam tablet 2 mg every 6 hours as needed for anxiety, seizures -- Lisinopril Tablet 10 mg p.o. daily for high blood pressure -- Start vitamin D3 tablet 1000 units, oral, daily for vitamin D deficiency -- Start Protonix EC tablet 20 mg, oral, daily for GERD   Continue BH Agitation Protocol --Haldol 5 mg, oral, 3 times daily as needed, mild agitation --Benadryl 50 mg, oral, 3 times daily as needed, mild agitation                                     OR  --Haldol injection 5 mg, IM, 3 times daily as needed, moderate agitation --Benadryl injection 50 mg, IM, 3 times daily as needed, moderate agitation --Ativan injection 2 mg, IM, 3 times daily as needed, moderate agitation                                     OR --Haldol injection 10 mg, IM, 3 times daily as needed, severe agitation --Benadryl injection 50 mg, IM, 3 times daily as needed, severe agitation --Ativan injection 2 mg, IM, 3 times daily as needed, severe agitation   --  The risks/benefits/side-effects/alternatives to this medication were discussed in detail with the patient and time was given for questions. The patient consents to medication trial.  -- FDA             -- Metabolic profile and EKG monitoring obtained while on an atypical  antipsychotic (BMI: Lipid Panel: HbgA1c: QTc:)              -- Encouraged patient to participate in unit milieu and in scheduled group therapies    Other PRN Medications -Acetaminophen 650 mg every 6 as needed/mild pain -Maalox 30 mL oral every 4 as needed/digestion -Magnesium hydroxide 30 mL daily as needed/mild constipation   --The risks/benefits/side-effects/alternatives to this medication were discussed in detail with the patient and time was given for questions. The patient consents to medication trial.  -- Metabolic profile and EKG monitoring obtained while on an atypical antipsychotic (BMI: Lipid Panel: HbgA1c: QTc:)  -- Encouraged patient to participate in unit milieu and in scheduled group therapies    Admission labs reviewed: CMP: CO2 17 low, glucose 111 high, otherwise normal.  CBC: Within normal limits.  BAL: Less than 10.  UDS: Positive for marijuana   New lab orders reviewed Vitamin D low at 27.91 TSH, Lipid panel, Hemoglobin A1c all within defined ranges.   EKG reviewed: Sinus rhythm, ventricular rate 70, QT/QTc at bedtime 366/395   Safety and Monitoring: Voluntary admission to inpatient psychiatric unit for safety, stabilization and treatment Daily contact with patient to assess and evaluate symptoms and progress in treatment Patient's case to be discussed in multi-disciplinary team meeting Observation Level : q15 minute checks Vital signs: q12 hours Precautions: suicide, but pt currently verbally contracts for  safety on unit    Discharge Planning: Social work and case management to assist with discharge planning and identification of hospital follow-up needs prior to discharge Estimated LOS: 5-7 days Discharge Concerns: Need to establish a safety plan; Medication compliance and effectiveness Discharge Goals: Return home with outpatient referrals for mental health follow-up including medication management/psychotherapy.    -- Long Term Goals: Improvement in symptoms  so as ready for discharge -- Short Term Goals: Ability to identify changes in lifestyle to reduce recurrence of condition will improve, Ability to verbalize feelings will improve, Ability to demonstrate self-control will improve, Ability to identify and develop effective coping behaviors will improve, Ability to maintain clinical measurements within normal limits will improve, Compliance with prescribed medications will improve, and Ability to identify triggers associated with substance abuse/mental health issues will improve Physician Treatment Plan for Secondary Diagnosis:  I certify that inpatient services furnished can reasonably be expected to improve the patient's condition.    Norma Fredrickson, NP 08/14/2023, 2:41 PM Patient ID: [name], male   DOB: Dec 18, 1980, 43 y.o.   MRN: [id number] Patient ID: Rakan Soffer, male   DOB: 03/16/1981, 43 y.o.   MRN: [id number] Patient ID: Nalin Mazzocco, male   DOB: Nov 21, 1980, 43 y.o.   MRN: [id number]

## 2023-08-14 NOTE — Progress Notes (Signed)
   08/14/23 1014  Psych Admission Type (Psych Patients Only)  Admission Status Voluntary  Psychosocial Assessment  Patient Complaints None  Eye Contact Fair  Facial Expression Animated  Affect Appropriate to circumstance  Speech Logical/coherent  Interaction Assertive  Motor Activity Fidgety  Appearance/Hygiene Unremarkable  Behavior Characteristics Cooperative;Appropriate to situation  Mood Euthymic  Thought Process  Coherency WDL  Content WDL  Delusions None reported or observed  Perception Hallucinations  Hallucination Visual (seeing shadow people)  Judgment Impaired  Confusion None  Danger to Self  Current suicidal ideation? Denies  Agreement Not to Harm Self Yes  Description of Agreement verbal  Danger to Others  Danger to Others None reported or observed   Progress note   D: Pt seen at med window. Pt denies SI, HI, AH. Endorses visual hallucinations of seeing shadow people. Pt rates pain  0/10. Pt rates anxiety  0/10 and depression  0/10. Pt reported this morning that he had a seizure in the night. "It was about midnight and I started shaking. I had a seizure. It lasted about 2 minutes." Pt did not report it to anyone. "My roommate was asleep and it was dark so I just laid there." Pt VS are WNL. Reported this to provider. No other concerns noted at this time.  A: Pt provided support and encouragement. Pt given scheduled medication as prescribed. PRNs as appropriate. Q15 min checks for safety.   R: Pt safe on the unit. Will continue to monitor.

## 2023-08-15 DIAGNOSIS — F332 Major depressive disorder, recurrent severe without psychotic features: Secondary | ICD-10-CM | POA: Diagnosis not present

## 2023-08-15 MED ORDER — IBUPROFEN 600 MG PO TABS
600.0000 mg | ORAL_TABLET | Freq: Four times a day (QID) | ORAL | Status: DC | PRN
  Administered 2023-08-15: 600 mg via ORAL
  Filled 2023-08-15: qty 1

## 2023-08-15 MED ORDER — CLONIDINE HCL 0.1 MG PO TABS
0.1000 mg | ORAL_TABLET | ORAL | Status: DC | PRN
  Administered 2023-08-16 – 2023-08-17 (×2): 0.1 mg via ORAL
  Filled 2023-08-15 (×2): qty 1

## 2023-08-15 NOTE — Progress Notes (Addendum)
 D. Pt has been friendly, pleasant during interactions- and has been visible in the milieu, observed interacting well with peers and attending groups. Pt currently denies SI/HI and AVH  A. Labs and vitals monitored. Pt given and educated on medications. Pt supported emotionally and encouraged to express concerns and ask questions.   R. Pt remains safe with 15 minute checks. Will continue POC.    08/15/23 0900  Psych Admission Type (Psych Patients Only)  Admission Status Voluntary  Psychosocial Assessment  Patient Complaints Anxiety;Depression  Eye Contact Fair  Facial Expression Anxious;Animated  Affect Appropriate to circumstance  Speech Logical/coherent  Interaction Assertive  Motor Activity Fidgety  Appearance/Hygiene Unremarkable  Behavior Characteristics Appropriate to situation;Cooperative  Mood Anxious;Pleasant  Thought Process  Coherency WDL  Content WDL  Delusions None reported or observed  Perception Hallucinations  Hallucination Visual  Judgment Impaired  Confusion None  Danger to Self  Current suicidal ideation? Denies  Danger to Others  Danger to Others None reported or observed

## 2023-08-15 NOTE — Progress Notes (Signed)
 [facility] MD Progress Note  08/15/2023 8:30 AM Raymond [name]  MRN:  [id number] Principal Problem: Major depressive disorder, recurrent severe without psychotic features (HCC) Diagnosis: Principal Problem:   Major depressive disorder, recurrent severe without psychotic features (HCC)  Total Time spent with patient: 45 minutes  Reason for admission:  Raymond [name] is a 43 year old Caucasian male with prior psychiatric history of anxiety, panic attacks, depression, PTSD, who presents voluntarily to [facility] from [facility] ED after stabilization for worsening depression, anxiety, panic attack, resulting in suicidal ideation by intentional overdosing on 90 pills of 500 mg Keppra and trazodone.   On assessment today the patient reports that his mood is better but does not feel that he is quite ready for discharge.  He reports feeling less depressed but anxiety continues to be up and down.  He reports that he would feel more comfortable discharging tomorrow.  He is worried about his follow-up cancer treatment but he does recognize that he needs to call the cancer Center in [place] discussing appointment.  He reports that sleep is better and appetite and concentration are stable.  Denies any SI or HI.  Denies any psychotic symptoms.  Denies any side effects to medications.  We discussed discharge planning for tomorrow, and the patient is agreeable. Denies having any more seizure like activity in the last 24 hours.     Past Psychiatric History: Previous Psych Diagnoses: Major depressive disorder, recurrent severe without psychotic features, amphetamine abuse, cocaine abuse, substance-induced mood disorder, with suicide attempt.  Prior inpatient treatment: Admitted to [facility] behavioral health in [place] in 2020 for suicide attempt for 1 month.  Current/prior outpatient treatment: None Prior rehab hx: Rehab for alcohol x 1 month in 2019 Psychotherapy hx:  Yes History of suicide: X multiple attempts History of homicide or aggression: Denies Psychiatric medication history: Thorazine, fluoxetine, hydroxyzine, risperidone, and trazodone. Psychiatric medication compliance history: Noncompliance Neuromodulation history: Denies  Current Psychiatrist: Denies Current therapist: Seeing Graciella Freer at the [place]   Past Medical History:  Past Medical History:  Diagnosis Date   Alcohol abuse    Anxiety    Hepatitis C    PTSD (post-traumatic stress disorder)     Past Surgical History:  Procedure Laterality Date   FACIAL FRACTURE SURGERY     metal plate under right eye   Family History: History reviewed. No pertinent family history. Family Psychiatric  History: See H&P Social History:  Social History   Substance and Sexual Activity  Alcohol Use Yes   Comment: 1 liter bottle of vodka prior to arrival     Social History   Substance and Sexual Activity  Drug Use Yes   Types: Methamphetamines    Social History   Socioeconomic History   Marital status: Single    Spouse name: Not on file   Number of children: Not on file   Years of education: Not on file   Highest education level: Not on file  Occupational History   Not on file  Tobacco Use   Smoking status: Every Day    Current packs/day: 1.00    Types: Cigarettes    Passive exposure: Current   Smokeless tobacco: Never  Vaping Use   Vaping status: Never Used  Substance and Sexual Activity   Alcohol use: Yes    Comment: 1 liter bottle of vodka prior to arrival   Drug use: Yes    Types: Methamphetamines   Sexual activity: Not Currently  Other Topics Concern  Not on file  Social History Narrative   Not on file   Social Drivers of Health   Financial Resource Strain: Not on File (08/18/2022)   Received from [employer] [employer]    Financial Resource Strain: 0  Food Insecurity: No Food Insecurity (08/09/2023)   Hunger Vital Sign    Worried About  Running Out of Food in the Last Year: Never true    Ran Out of Food in the Last Year: Never true  Transportation Needs: No Transportation Needs (08/09/2023)   PRAPARE - Administrator, Civil Service (Medical): No    Lack of Transportation (Non-Medical): No  Physical Activity: Not on File (08/18/2022)   Received from [place], [place]   Physical Activity    Physical Activity: 0  Stress: Not on File (08/18/2022)   Received from [facility], [place]   Stress    Stress: 0  Social Connections: Not on File (02/15/2023)   Received from [facility]   Social Connections    Connectedness: 0   Additional Social History:    Current Medications: Current Facility-Administered Medications  Medication Dose Route Frequency Provider Last Rate Last Admin   albuterol (VENTOLIN HFA) 108 (90 Base) MCG/ACT inhaler 2 puff  2 puff Inhalation Q4H PRN Starleen Blue, NP   2 puff at 08/15/23 0751   alum & mag hydroxide-simeth (MAALOX/MYLANTA) 200-200-20 MG/5ML suspension 30 mL  30 mL Oral Q4H PRN Dahlia Byes C, NP       cloNIDine (CATAPRES) tablet 0.1 mg  0.1 mg Oral Q4H PRN Cayle Thunder, Harrold Donath, MD       diphenhydrAMINE (BENADRYL) capsule 50 mg  50 mg Oral Q6H PRN Welford Roche, Josephine C, NP       Or   diphenhydrAMINE (BENADRYL) injection 50 mg  50 mg Intramuscular Q6H PRN Onuoha, Josephine C, NP       haloperidol (HALDOL) tablet 5 mg  5 mg Oral TID PRN Dahlia Byes C, NP       And   diphenhydrAMINE (BENADRYL) capsule 50 mg  50 mg Oral TID PRN Dahlia Byes C, NP       haloperidol lactate (HALDOL) injection 5 mg  5 mg Intramuscular TID PRN Dahlia Byes C, NP       And   diphenhydrAMINE (BENADRYL) injection 50 mg  50 mg Intramuscular TID PRN Dahlia Byes C, NP       And   LORazepam (ATIVAN) injection 2 mg  2 mg Intramuscular TID PRN Dahlia Byes C, NP       haloperidol lactate (HALDOL) injection 10 mg  10 mg Intramuscular TID PRN Dahlia Byes C, NP       And   diphenhydrAMINE (BENADRYL)  injection 50 mg  50 mg Intramuscular TID PRN Dahlia Byes C, NP       And   LORazepam (ATIVAN) injection 2 mg  2 mg Intramuscular TID PRN Dahlia Byes C, NP       docusate sodium (COLACE) capsule 100 mg  100 mg Oral BID Christoper Bushey, Harrold Donath, MD   100 mg at 08/15/23 0744   FLUoxetine (PROZAC) capsule 60 mg  60 mg Oral Daily Rehmat Murtagh, Harrold Donath, MD   60 mg at 08/15/23 0744   levETIRAcetam (KEPPRA) tablet 750 mg  750 mg Oral BID Bobbitt, Shalon E, NP   750 mg at 08/15/23 0744   lisinopril (ZESTRIL) tablet 10 mg  10 mg Oral Daily Bobbitt, Shalon E, NP   10 mg at 08/15/23 0744   nicotine  polacrilex (NICORETTE) gum 2 mg  2 mg Oral PRN Abbott Pao, Nadir, MD   2 mg at 08/13/23 1428   pantoprazole (PROTONIX) EC tablet 20 mg  20 mg Oral Daily Bennett, Christal H, NP   20 mg at 08/15/23 0744   polyethylene glycol (MIRALAX / GLYCOLAX) packet 17 g  17 g Oral Daily Ohanna Gassert, Harrold Donath, MD   17 g at 08/15/23 0744   risperiDONE (RISPERDAL) tablet 2 mg  2 mg Oral QHS Amsi Grimley, MD   2 mg at 08/14/23 2131   traZODone (DESYREL) tablet 150 mg  150 mg Oral QHS Dahlia Byes C, NP   150 mg at 08/14/23 2131   vitamin D3 (CHOLECALCIFEROL) tablet 1,000 Units  1,000 Units Oral Daily Bennett, Christal H, NP   1,000 Units at 08/15/23 1191    Lab Results:  Results for orders placed or performed during the hospital encounter of 08/09/23 (from the past 48 hours)  Glucose, capillary     Status: Abnormal   Collection Time: 08/13/23  9:07 PM  Result Value Ref Range   Glucose-Capillary 130 (H) 70 - 99 mg/dL    Comment: Glucose reference range applies only to samples taken after fasting for at least 8 hours.   Comment 1 Notify RN    Comment 2 Document in Chart     Blood Alcohol level:  Lab Results  Component Value Date   ETH <10 08/08/2023   ETH 297 (H) 06/27/2023   Metabolic Disorder Labs: Lab Results  Component Value Date   HGBA1C 5.3 08/11/2023   MPG 105.41 08/11/2023   MPG 108.28 03/28/2023    Lab Results  Component Value Date   PROLACTIN 12.0 01/22/2023   PROLACTIN 19.5 11/20/2022   Lab Results  Component Value Date   CHOL 172 08/11/2023   TRIG 146 08/11/2023   HDL 53 08/11/2023   CHOLHDL 3.2 08/11/2023   VLDL 29 08/11/2023   LDLCALC 90 08/11/2023   LDLCALC 84 03/28/2023    Physical Findings: AIMS: Facial and Oral Movements Muscles of Facial Expression: None Lips and Perioral Area: None Jaw: None Tongue: None,Extremity Movements Upper (arms, wrists, hands, fingers): None Lower (legs, knees, ankles, toes): None, Trunk Movements Neck, shoulders, hips: None, Global Judgements Severity of abnormal movements overall : None Patient's awareness of abnormal movements:  (N/A), Dental Status Current problems with teeth and/or dentures?: No Does patient usually wear dentures?: No Edentia?: No  CIWA:   N/A COWS:  N/A   Musculoskeletal: Strength & Muscle Tone: within normal limits Gait & Station: normal Patient leans: N/A  Psychiatric Specialty Exam:  Presentation  General Appearance:  Appropriate for Environment; Casual; Fairly Groomed  Eye Contact: Good  Speech: Normal Rate; Clear and Coherent  Speech Volume: Normal  Handedness: Right  Mood and Affect  Mood: Anxious  Affect: Appropriate; Congruent; Full Range  Thought Process  Thought Processes: Linear  Descriptions of Associations:Intact  Orientation:Full (Time, Place and Person)  Thought Content:Logical  History of Schizophrenia/Schizoaffective disorder:No  Duration of Psychotic Symptoms:No data recorded Hallucinations:Hallucinations: None Description of Visual Hallucinations: Reports seeing shadow people, no face, only body shapes.   Ideas of Reference:None  Suicidal Thoughts:Suicidal Thoughts: No   Homicidal Thoughts:Homicidal Thoughts: No   Sensorium  Memory: Immediate Good; Recent Good; Remote Good  Judgment: Good  Insight: Good  Executive Functions   Concentration: Good  Attention Span: Good  Recall: Good  Fund of Knowledge: Good  Language: Good  Psychomotor Activity  Psychomotor Activity: Psychomotor Activity: Normal   Assets  Assets: Manufacturing systems engineer; Desire for Improvement; Resilience; Social Support  Sleep  Sleep: Sleep: Fair   Physical Exam: Physical Exam Vitals and nursing note reviewed.  Constitutional:      General: He is not in acute distress.    Appearance: He is not toxic-appearing.  HENT:     Head: Normocephalic.  Cardiovascular:     Pulses: Normal pulses.     Comments: Blood Pressure 125/99 Pulmonary:     Effort: No respiratory distress.  Abdominal:     Comments: Deferred  Genitourinary:    Comments: Deferred Neurological:     Mental Status: He is alert and oriented to person, place, and time.    Review of Systems  Constitutional: Negative.   Respiratory:  Negative for shortness of breath.   Cardiovascular:  Negative for chest pain and palpitations.  Gastrointestinal:  Negative for constipation, diarrhea, nausea and vomiting.  Skin: Negative.   Neurological:  Positive for seizures (Documented history of seizures). Negative for dizziness, tingling, tremors and headaches.  Endo/Heme/Allergies:        See allergy listing  Psychiatric/Behavioral:  Positive for depression and hallucinations. Negative for suicidal ideas. The patient is nervous/anxious. The patient does not have insomnia.    Blood pressure (!) 125/114, pulse (!) 108, temperature 98 F (36.7 C), temperature source Oral, resp. rate 16, height 6\' 1"  (1.854 m), weight 77.2 kg, SpO2 98%. Body mass index is 22.46 kg/m.  Treatment Plan Summary: Daily contact with patient to assess and evaluate symptoms and progress in treatment and Medication management Physician Treatment Plan for Primary Diagnosis: Assessment: Jamair Cato is a 43 year old Caucasian male with prior psychiatric history of anxiety, panic attacks, depression,  PTSD, who presents voluntarily to [facility] from [facility] ED after stabilization for worsening depression, anxiety, panic attack, resulting in suicidal ideation by intentional overdosing on 90 pills of 500 mg Keppra and trazodone.     Active Problems:   Major depressive disorder, recurrent severe without psychotic features (HCC) Generalized anxiety disorder PTSD Seizure disorder   Plans: Medications: -- Continue Prozac capsule 60 mg, oral, daily for depression and anxiety -- Continue Risperdal tablet 2 mg oral, daily at bedtime for psychosis -- Continue trazodone 150 mg, oral, daily at bedtime for insomnia   Medications for other medical problems: -- Continue Keppra tablet 750 mg p.o. twice daily to prevent seizures -- Nicotine gum 2 mg p.o. at needed for smoking cessation -- Lorazepam tablet 2 mg every 6 hours as needed for anxiety, seizures -- Lisinopril Tablet 10 mg p.o. daily for high blood pressure -- Continue vitamin D3 tablet 1000 units, oral, daily for vitamin D deficiency -- Continue Protonix EC tablet 20 mg, oral, daily for GERD   Continue BH Agitation Protocol --Haldol 5 mg, oral, 3 times daily as needed, mild agitation --Benadryl 50 mg, oral, 3 times daily as needed, mild agitation                                     OR  --Haldol injection 5 mg, IM, 3 times daily as needed, moderate agitation --Benadryl injection 50 mg, IM, 3 times daily as needed, moderate agitation --Ativan injection 2 mg, IM, 3 times daily as needed, moderate agitation  OR --Haldol injection 10 mg, IM, 3 times daily as needed, severe agitation --Benadryl injection 50 mg, IM, 3 times daily as needed, severe agitation --Ativan injection 2 mg, IM, 3 times daily as needed, severe agitation   --  The risks/benefits/side-effects/alternatives to this medication were discussed in detail with the patient and time was given for  questions. The patient consents to medication trial.  -- FDA             -- Metabolic profile and EKG monitoring obtained while on an atypical antipsychotic (BMI: Lipid Panel: HbgA1c: QTc:)              -- Encouraged patient to participate in unit milieu and in scheduled group therapies    Other PRN Medications -Acetaminophen 650 mg every 6 as needed/mild pain -Maalox 30 mL oral every 4 as needed/digestion -Magnesium hydroxide 30 mL daily as needed/mild constipation   --The risks/benefits/side-effects/alternatives to this medication were discussed in detail with the patient and time was given for questions. The patient consents to medication trial.  -- Metabolic profile and EKG monitoring obtained while on an atypical antipsychotic (BMI: Lipid Panel: HbgA1c: QTc:)  -- Encouraged patient to participate in unit milieu and in scheduled group therapies    Admission labs reviewed: CMP: CO2 17 low, glucose 111 high, otherwise normal.  CBC: Within normal limits.  BAL: Less than 10.  UDS: Positive for marijuana   New lab orders reviewed Vitamin D low at 27.91 TSH, Lipid panel, Hemoglobin A1c all within defined ranges.   EKG reviewed: Sinus rhythm, ventricular rate 70, QT/QTc at bedtime 366/395   Safety and Monitoring: Voluntary admission to inpatient psychiatric unit for safety, stabilization and treatment Daily contact with patient to assess and evaluate symptoms and progress in treatment Patient's case to be discussed in multi-disciplinary team meeting Observation Level : q15 minute checks Vital signs: q12 hours Precautions: suicide, but pt currently verbally contracts for safety on unit    Discharge Planning: Social work and case management to assist with discharge planning and identification of hospital follow-up needs prior to discharge Estimated LOS: tomorrow 3-16 Discharge Concerns: Need to establish a safety plan; Medication compliance and effectiveness Discharge Goals: Return home  with outpatient referrals for mental health follow-up including medication management/psychotherapy.  I certify that inpatient services furnished can reasonably be expected to improve the patient's condition.    Phineas Inches, MD 08/15/2023, 8:30 AM  Total Time Spent in Direct Patient Care:  I personally spent 30 minutes on the unit in direct patient care. The direct patient care time included face-to-face time with the patient, reviewing the patient's chart, communicating with other professionals, and coordinating care. Greater than 50% of this time was spent in counseling or coordinating care with the patient regarding goals of hospitalization, psycho-education, and discharge planning needs.   Phineas Inches, MD Psychiatrist

## 2023-08-15 NOTE — Plan of Care (Signed)
   Problem: Education: Goal: Emotional status will improve Outcome: Progressing Goal: Mental status will improve Outcome: Progressing   Problem: Activity: Goal: Interest or engagement in activities will improve Outcome: Progressing

## 2023-08-15 NOTE — Progress Notes (Signed)
   08/14/23 2043  Psych Admission Type (Psych Patients Only)  Admission Status Voluntary  Psychosocial Assessment  Patient Complaints Depression;Anxiety  Eye Contact Fair  Facial Expression Anxious  Affect Appropriate to circumstance  Speech Logical/coherent  Interaction Assertive  Motor Activity Fidgety  Appearance/Hygiene Unremarkable  Behavior Characteristics Appropriate to situation;Cooperative  Mood Anxious  Thought Process  Coherency WDL  Content WDL  Delusions None reported or observed  Perception Hallucinations  Hallucination Visual  Judgment Impaired  Confusion None  Danger to Self  Current suicidal ideation? Denies  Agreement Not to Harm Self Yes  Description of Agreement verbal  Danger to Others  Danger to Others None reported or observed

## 2023-08-15 NOTE — [facility] Group Notes (Signed)
 [facility] Group Notes:  (Nursing/MHT/Case Management/Adjunct)  Date:  08/15/2023  Time:  8:56 PM  Type of Therapy:   Wrap-up group  Participation Level:  Active  Participation Quality:  Appropriate  Affect:  Appropriate  Cognitive:  Appropriate  Insight:  Appropriate  Engagement in Group:  Improving  Modes of Intervention:  Education  Summary of Progress/Problems: Goal to take meds. Met goal. Rated day 8/10.   Raymond [name] 08/15/2023, 8:56 PM

## 2023-08-15 NOTE — Group Note (Signed)
 Date:  08/15/2023 Time:  9:54 AM  Group Topic/Focus:  Coping With Mental Health Crisis:   The purpose of this group is to help patients identify strategies for coping with mental health crisis.  Group discusses possible causes of crisis and ways to manage them effectively. Goals Group:   The focus of this group is to help patients establish daily goals to achieve during treatment and discuss how the patient can incorporate goal setting into their daily lives to aide in recovery. Orientation:   The focus of this group is to educate the patient on the purpose and policies of crisis stabilization and provide a format to answer questions about their admission.  The group details unit policies and expectations of patients while admitted.    Participation Level:  Active  Participation Quality:  Appropriate  Affect:  Appropriate  Cognitive:  Appropriate  Insight: Appropriate  Engagement in Group:  Engaged  Modes of Intervention:  Discussion, Orientation, and Socialization  Additional Comments:  d/c plan  Raymond [name] 08/15/2023, 9:54 AM

## 2023-08-15 NOTE — Group Note (Signed)
  [facility] LCSW Group Therapy Note    Type of Therapy and Topic:  Group Therapy:  Identifying Triggers and Coping Mechanisms  Participation Level:  Active   Description of Group This process group involved patients discussing what how to identify triggers and what coping skills they can use.  These necessary actions were discussed in detail, including why they are important and methods to be used to ensure they are continued.  The group brainstormed together other possible coping skills that would benefit patients by being continued after discharge.  Therapeutic Goals Patients will identify and describe behavioral triggers and coping mechanisms. Patients will verbalize benefits of these actions Patients will describe specifically how they plan to keep these habits active Patients will brainstorm other necessary actions that can produce positive benefits in the outpatient setting  Summary of Patient Progress:  The patient expressed one thing he can continue after hospital discharge that will help him stay well is empathy.  The way in which this can be continued is daily practice.    Therapeutic Modalities Cognitive Behavioral Therapy Motivational Interviewing   Azucena Kuba, MSW, LCSWA

## 2023-08-15 NOTE — Progress Notes (Signed)
   08/15/23 2300  Psych Admission Type (Psych Patients Only)  Admission Status Voluntary  Psychosocial Assessment  Patient Complaints Anxiety;Depression (Pt c/o SI yesterday but denied SI today. Pt still grieving the loss of his mother to cancer 2 years ago. Endorsed working on better coping choices and appreciated supportive atmosphere in milieu.)  Eye Contact Fair  Facial Expression Animated  Affect Appropriate to circumstance  Speech Logical/coherent;Loud  Interaction Assertive  Motor Activity Fidgety  Appearance/Hygiene Unremarkable  Behavior Characteristics Appropriate to situation  Mood Anxious;Pleasant (Pt is forward thinking to discharge and intends to call [facility] for appt. for lung tumor.)  Thought Process  Coherency WDL  Content WDL  Delusions None reported or observed  Perception WDL  Hallucination None reported or observed  Judgment Impaired  Confusion None  Danger to Self  Current suicidal ideation? Denies  Agreement Not to Harm Self Yes  Description of Agreement Verbal  Danger to Others  Danger to Others None reported or observed

## 2023-08-16 DIAGNOSIS — F332 Major depressive disorder, recurrent severe without psychotic features: Secondary | ICD-10-CM | POA: Diagnosis not present

## 2023-08-16 MED ORDER — CLONIDINE HCL 0.1 MG PO TABS
0.1000 mg | ORAL_TABLET | Freq: Two times a day (BID) | ORAL | Status: DC
  Administered 2023-08-16 – 2023-08-17 (×3): 0.1 mg via ORAL
  Filled 2023-08-16 (×8): qty 1

## 2023-08-16 NOTE — Progress Notes (Signed)
   08/16/23 0930  Psych Admission Type (Psych Patients Only)  Admission Status Voluntary  Psychosocial Assessment  Patient Complaints Anxiety;Depression  Eye Contact Fair  Facial Expression Anxious;Animated  Affect Appropriate to circumstance  Speech Logical/coherent  Interaction Assertive  Motor Activity Fidgety  Appearance/Hygiene Unremarkable  Behavior Characteristics Appropriate to situation  Mood Anxious  Thought Process  Coherency WDL  Content WDL  Delusions None reported or observed  Perception WDL  Hallucination None reported or observed  Judgment Impaired  Confusion None  Danger to Self  Current suicidal ideation? Denies  Agreement Not to Harm Self Yes  Description of Agreement Verbal  Danger to Others  Danger to Others None reported or observed

## 2023-08-16 NOTE — Progress Notes (Signed)
 Pt presents animated, states he will ask the doctor if he can stay longer due to "my apartment has leaks and the guy who is going to fix it, his wife had a baby today so he couldn't go fix it". Pt reports a good appetite, and no physical problems. Pt denies SI/HI/AVH and verbally contracts for safety. Provided support and encouragement. Pt safe on the unit. Q 15 minute safety checks continued.

## 2023-08-16 NOTE — Progress Notes (Addendum)
 [facility] MD Progress Note  08/16/2023 9:37 AM Cordaryl Decelles  MRN:  [id number] Principal Problem: Major depressive disorder, recurrent severe without psychotic features (HCC) Diagnosis: Principal Problem:   Major depressive disorder, recurrent severe without psychotic features (HCC)  Total Time spent with patient: 45 minutes  Reason for admission:  Raymond [name] is a 43 year old Caucasian male with prior psychiatric history of anxiety, panic attacks, depression, PTSD, who presents voluntarily to [facility] from [facility] ED after stabilization for worsening depression, anxiety, panic attack, resulting in suicidal ideation by intentional overdosing on 90 pills of 500 mg Keppra and trazodone.    On assessment today, patient reports that his mood is anxious.  He continues to complain of multiple somatic symptoms.  Reports some dizziness today, putting high blood pressure.  We discussed his blood pressure normalized with as needed clonidine.  We discussed starting schedule clonidine for treatment of his hypertension, and him follow up with outpatient PCP for management of hypertension.  Patient is agreeable.  He is still concerned about his lung cancer, and acknowledges that he has instructions to get in contact with  cancer Center after discharge.  Reports that sleep is okay.  Appetite concentration are okay.  Denies SI today.  Denies pervasive sadness and anhedonia.  He is future oriented.  Denies HI.  Denies psychotic symptoms except for visual hallucinations which are chronic.  Otherwise he denies side effects to his current psychiatric medications.  We discussed discharge planning for tomorrow and the patient is agreeable, and states that his landlord is fixing electrical problems in his apartment which will not be completed until tomorrow, 03/17.  Denies any additional seizure activity within the last 48 hours.      Past Psychiatric History: Previous Psych  Diagnoses: Major depressive disorder, recurrent severe without psychotic features, amphetamine abuse, cocaine abuse, substance-induced mood disorder, with suicide attempt.  Prior inpatient treatment: Admitted to [facility] behavioral health in [place] in 2020 for suicide attempt for 1 month.  Current/prior outpatient treatment: None Prior rehab hx: Rehab for alcohol x 1 month in 2019 Psychotherapy hx: Yes History of suicide: X multiple attempts History of homicide or aggression: Denies Psychiatric medication history: Thorazine, fluoxetine, hydroxyzine, risperidone, and trazodone. Psychiatric medication compliance history: Noncompliance Neuromodulation history: Denies  Current Psychiatrist: Denies Current therapist: Seeing Graciella Freer at the [place]   Past Medical History:  Past Medical History:  Diagnosis Date   Alcohol abuse    Anxiety    Hepatitis C    PTSD (post-traumatic stress disorder)     Past Surgical History:  Procedure Laterality Date   FACIAL FRACTURE SURGERY     metal plate under right eye   Family History: History reviewed. No pertinent family history. Family Psychiatric  History: See H&P Social History:  Social History   Substance and Sexual Activity  Alcohol Use Yes   Comment: 1 liter bottle of vodka prior to arrival     Social History   Substance and Sexual Activity  Drug Use Yes   Types: Methamphetamines    Social History   Socioeconomic History   Marital status: Single    Spouse name: Not on file   Number of children: Not on file   Years of education: Not on file   Highest education level: Not on file  Occupational History   Not on file  Tobacco Use   Smoking status: Every Day    Current packs/day: 1.00    Types: Cigarettes  Passive exposure: Current   Smokeless tobacco: Never  Vaping Use   Vaping status: Never Used  Substance and Sexual Activity   Alcohol use: Yes    Comment: 1 liter bottle of vodka prior to arrival    Drug use: Yes    Types: Methamphetamines   Sexual activity: Not Currently  Other Topics Concern   Not on file  Social History Narrative   Not on file   Social Drivers of Health   Financial Resource Strain: Not on File (08/18/2022)   Received from [facility]    Financial Resource Strain: 0  Food Insecurity: No Food Insecurity (08/09/2023)   Hunger Vital Sign    Worried About Running Out of Food in the Last Year: Never true    Ran Out of Food in the Last Year: Never true  Transportation Needs: No Transportation Needs (08/09/2023)   PRAPARE - Administrator, Civil Service (Medical): No    Lack of Transportation (Non-Medical): No  Physical Activity: Not on File (08/18/2022)   Received from [place], [place]   Physical Activity    Physical Activity: 0  Stress: Not on File (08/18/2022)   Received from [facility], [place]   Stress    Stress: 0  Social Connections: Not on File (02/15/2023)   Received from [facility]   Social Connections    Connectedness: 0   Additional Social History:    Current Medications: Current Facility-Administered Medications  Medication Dose Route Frequency Provider Last Rate Last Admin   albuterol (VENTOLIN HFA) 108 (90 Base) MCG/ACT inhaler 2 puff  2 puff Inhalation Q4H PRN Starleen Blue, NP   2 puff at 08/15/23 0751   alum & mag hydroxide-simeth (MAALOX/MYLANTA) 200-200-20 MG/5ML suspension 30 mL  30 mL Oral Q4H PRN Dahlia Byes C, NP       cloNIDine (CATAPRES) tablet 0.1 mg  0.1 mg Oral Q4H PRN Mattisen Pohlmann, Harrold Donath, MD   0.1 mg at 08/16/23 6213   cloNIDine (CATAPRES) tablet 0.1 mg  0.1 mg Oral Q12H Leauna Sharber, MD       diphenhydrAMINE (BENADRYL) capsule 50 mg  50 mg Oral Q6H PRN Welford Roche, Josephine C, NP       Or   diphenhydrAMINE (BENADRYL) injection 50 mg  50 mg Intramuscular Q6H PRN Onuoha, Josephine C, NP       haloperidol (HALDOL) tablet 5 mg  5 mg Oral TID PRN Dahlia Byes C, NP       And   diphenhydrAMINE  (BENADRYL) capsule 50 mg  50 mg Oral TID PRN Dahlia Byes C, NP       haloperidol lactate (HALDOL) injection 5 mg  5 mg Intramuscular TID PRN Dahlia Byes C, NP       And   diphenhydrAMINE (BENADRYL) injection 50 mg  50 mg Intramuscular TID PRN Dahlia Byes C, NP       And   LORazepam (ATIVAN) injection 2 mg  2 mg Intramuscular TID PRN Dahlia Byes C, NP       haloperidol lactate (HALDOL) injection 10 mg  10 mg Intramuscular TID PRN Dahlia Byes C, NP       And   diphenhydrAMINE (BENADRYL) injection 50 mg  50 mg Intramuscular TID PRN Dahlia Byes C, NP       And   LORazepam (ATIVAN) injection 2 mg  2 mg Intramuscular TID PRN Dahlia Byes C, NP       docusate sodium (COLACE) capsule 100 mg  100 mg  Oral BID Phineas Inches, MD   100 mg at 08/16/23 0813   FLUoxetine (PROZAC) capsule 60 mg  60 mg Oral Daily Lace Chenevert, MD   60 mg at 08/16/23 0813   ibuprofen (ADVIL) tablet 600 mg  600 mg Oral Q6H PRN Bobbitt, Shalon E, NP   600 mg at 08/15/23 2152   levETIRAcetam (KEPPRA) tablet 750 mg  750 mg Oral BID Bobbitt, Shalon E, NP   750 mg at 08/16/23 0812   lisinopril (ZESTRIL) tablet 10 mg  10 mg Oral Daily Bobbitt, Shalon E, NP   10 mg at 08/16/23 0813   nicotine polacrilex (NICORETTE) gum 2 mg  2 mg Oral PRN Abbott Pao, Nadir, MD   2 mg at 08/15/23 2109   pantoprazole (PROTONIX) EC tablet 20 mg  20 mg Oral Daily Bennett, Christal H, NP   20 mg at 08/16/23 0813   polyethylene glycol (MIRALAX / GLYCOLAX) packet 17 g  17 g Oral Daily Domanique Huesman, Harrold Donath, MD   17 g at 08/16/23 0813   risperiDONE (RISPERDAL) tablet 2 mg  2 mg Oral QHS Shauniece Kwan, MD   2 mg at 08/15/23 2108   traZODone (DESYREL) tablet 150 mg  150 mg Oral QHS Dahlia Byes C, NP   150 mg at 08/15/23 2108   vitamin D3 (CHOLECALCIFEROL) tablet 1,000 Units  1,000 Units Oral Daily Bennett, Christal H, NP   1,000 Units at 08/16/23 0813    Lab Results:  No results found for this or any  previous visit (from the past 48 hours).   Blood Alcohol level:  Lab Results  Component Value Date   ETH <10 08/08/2023   ETH 297 (H) 06/27/2023   Metabolic Disorder Labs: Lab Results  Component Value Date   HGBA1C 5.3 08/11/2023   MPG 105.41 08/11/2023   MPG 108.28 03/28/2023   Lab Results  Component Value Date   PROLACTIN 12.0 01/22/2023   PROLACTIN 19.5 11/20/2022   Lab Results  Component Value Date   CHOL 172 08/11/2023   TRIG 146 08/11/2023   HDL 53 08/11/2023   CHOLHDL 3.2 08/11/2023   VLDL 29 08/11/2023   LDLCALC 90 08/11/2023   LDLCALC 84 03/28/2023    Physical Findings: AIMS: Facial and Oral Movements Muscles of Facial Expression: None Lips and Perioral Area: None Jaw: None Tongue: None,Extremity Movements Upper (arms, wrists, hands, fingers): None Lower (legs, knees, ankles, toes): None, Trunk Movements Neck, shoulders, hips: None, Global Judgements Severity of abnormal movements overall : None Patient's awareness of abnormal movements:  (N/A), Dental Status Current problems with teeth and/or dentures?: No Does patient usually wear dentures?: No Edentia?: No  CIWA:   N/A COWS:  N/A   Musculoskeletal: Strength & Muscle Tone: within normal limits Gait & Station: normal Patient leans: N/A  Psychiatric Specialty Exam:  Presentation  General Appearance:  Appropriate for Environment; Casual; Fairly Groomed  Eye Contact: Good  Speech: Normal Rate; Clear and Coherent  Speech Volume: Normal  Handedness: Right  Mood and Affect  Mood: Anxious  Affect: Appropriate; Congruent; Full Range  Thought Process  Thought Processes: Linear  Descriptions of Associations:Intact  Orientation:Full (Time, Place and Person)  Thought Content:Logical  History of Schizophrenia/Schizoaffective disorder:No  Duration of Psychotic Symptoms:No data recorded Hallucinations:Hallucinations: None   Ideas of Reference:None  Suicidal Thoughts:Suicidal  Thoughts: No   Homicidal Thoughts:Homicidal Thoughts: No   Sensorium  Memory: Immediate Good; Recent Good; Remote Good  Judgment: Good  Insight: Good  Executive Functions  Concentration: Good  Attention  Span: Good  Recall: Good  Fund of Knowledge: Good  Language: Good  Psychomotor Activity  Psychomotor Activity: Psychomotor Activity: Normal   Assets  Assets: Communication Skills; Desire for Improvement; Resilience; Social Support  Sleep  Sleep: Sleep: Fair   Physical Exam: Physical Exam Vitals and nursing note reviewed.  Constitutional:      General: He is not in acute distress.    Appearance: He is not toxic-appearing.  HENT:     Head: Normocephalic.  Cardiovascular:     Pulses: Normal pulses.     Comments: Blood Pressure 125/99 Pulmonary:     Effort: No respiratory distress.  Abdominal:     Comments: Deferred  Genitourinary:    Comments: Deferred Neurological:     Mental Status: He is alert and oriented to person, place, and time.     Motor: No weakness.     Gait: Gait normal.  Psychiatric:        Mood and Affect: Mood normal.        Behavior: Behavior normal.        Thought Content: Thought content normal.        Judgment: Judgment normal.    Review of Systems  Constitutional: Negative.   Respiratory:  Negative for shortness of breath.   Cardiovascular:  Negative for chest pain and palpitations.  Gastrointestinal:  Negative for constipation, diarrhea, nausea and vomiting.  Skin: Negative.   Neurological:  Positive for seizures (Documented history of seizures). Negative for dizziness, tingling, tremors and headaches.  Endo/Heme/Allergies:        See allergy listing  Psychiatric/Behavioral:  Negative for depression, hallucinations and suicidal ideas. The patient is nervous/anxious. The patient does not have insomnia.    Blood pressure (!) 143/86, pulse 99, temperature 98 F (36.7 C), temperature source Oral, resp. rate 16, height  6\' 1"  (1.854 m), weight 77.2 kg, SpO2 98%. Body mass index is 22.46 kg/m.  Treatment Plan Summary: Daily contact with patient to assess and evaluate symptoms and progress in treatment and Medication management Physician Treatment Plan for Primary Diagnosis: Assessment: Peggy Monk is a 42 year old Caucasian male with prior psychiatric history of anxiety, panic attacks, depression, PTSD, who presents voluntarily to [facility] from [facility] ED after stabilization for worsening depression, anxiety, panic attack, resulting in suicidal ideation by intentional overdosing on 90 pills of 500 mg Keppra and trazodone.     Active Problems:   Major depressive disorder, recurrent severe without psychotic features (HCC) Generalized anxiety disorder PTSD Seizure disorder   Plans: Medications: -- Continue Prozac capsule 60 mg, oral, daily for depression and anxiety -- Continue Risperdal tablet 2 mg oral, daily at bedtime for psychosis -- Continue trazodone 150 mg, oral, daily at bedtime for insomnia   Medications for other medical problems: -- Continue Keppra tablet 750 mg p.o. twice daily to prevent seizures -- Nicotine gum 2 mg p.o. at needed for smoking cessation -- Lorazepam tablet 2 mg every 6 hours as needed for anxiety, seizures -- Lisinopril Tablet 10 mg p.o. daily for high blood pressure -- Continue vitamin D3 tablet 1000 units, oral, daily for vitamin D deficiency -- Continue Protonix EC tablet 20 mg, oral, daily for GERD   Continue BH Agitation Protocol --Haldol 5 mg, oral, 3 times daily as needed, mild agitation --Benadryl 50 mg, oral, 3 times daily as needed, mild agitation  OR  --Haldol injection 5 mg, IM, 3 times daily as needed, moderate agitation --Benadryl injection 50 mg, IM, 3 times daily as needed, moderate agitation --Ativan injection 2 mg, IM, 3 times daily as needed, moderate agitation                                      OR --Haldol injection 10 mg, IM, 3 times daily as needed, severe agitation --Benadryl injection 50 mg, IM, 3 times daily as needed, severe agitation --Ativan injection 2 mg, IM, 3 times daily as needed, severe agitation   --  The risks/benefits/side-effects/alternatives to this medication were discussed in detail with the patient and time was given for questions. The patient consents to medication trial.  -- FDA             -- Metabolic profile and EKG monitoring obtained while on an atypical antipsychotic (BMI: Lipid Panel: HbgA1c: QTc:)              -- Encouraged patient to participate in unit milieu and in scheduled group therapies    Other PRN Medications -Acetaminophen 650 mg every 6 as needed/mild pain -Maalox 30 mL oral every 4 as needed/digestion -Magnesium hydroxide 30 mL daily as needed/mild constipation   --The risks/benefits/side-effects/alternatives to this medication were discussed in detail with the patient and time was given for questions. The patient consents to medication trial.  -- Metabolic profile and EKG monitoring obtained while on an atypical antipsychotic (BMI: Lipid Panel: HbgA1c: QTc:)  -- Encouraged patient to participate in unit milieu and in scheduled group therapies    Admission labs reviewed: CMP: CO2 17 low, glucose 111 high, otherwise normal.  CBC: Within normal limits.  BAL: Less than 10.  UDS: Positive for marijuana   New lab orders reviewed Vitamin D low at 27.91 TSH, Lipid panel, Hemoglobin A1c all within defined ranges.   EKG reviewed: Sinus rhythm, ventricular rate 70, QT/QTc at bedtime 366/395   Safety and Monitoring: Voluntary admission to inpatient psychiatric unit for safety, stabilization and treatment Daily contact with patient to assess and evaluate symptoms and progress in treatment Patient's case to be discussed in multi-disciplinary team meeting Observation Level : q15 minute checks Vital signs: q12  hours Precautions: suicide, but pt currently verbally contracts for safety on unit    Discharge Planning: Social work and case management to assist with discharge planning and identification of hospital follow-up needs prior to discharge Estimated LOS: tomorrow 3-17 Discharge Concerns: Need to establish a safety plan; Medication compliance and effectiveness Discharge Goals: Return home with outpatient referrals for mental health follow-up including medication management/psychotherapy.  I certify that inpatient services furnished can reasonably be expected to improve the patient's condition.    Phineas Inches, MD 08/16/2023, 9:37 AM  Total Time Spent in Direct Patient Care:  I personally spent 30 minutes on the unit in direct patient care. The direct patient care time included face-to-face time with the patient, reviewing the patient's chart, communicating with other professionals, and coordinating care. Greater than 50% of this time was spent in counseling or coordinating care with the patient regarding goals of hospitalization, psycho-education, and discharge planning needs.   Phineas Inches, MD Psychiatrist

## 2023-08-16 NOTE — Group Note (Signed)
 Date:  08/16/2023 Time:  9:21 AM  Group Topic/Focus:  Goals Group:   The focus of this group is to help patients establish daily goals to achieve during treatment and discuss how the patient can incorporate goal setting into their daily lives to aide in recovery.    Participation Level:  Active  Participation Quality:  Appropriate  Affect:  Appropriate  Cognitive:  Appropriate  Insight: Appropriate  Engagement in Group:  Engaged  Modes of Intervention:  Orientation  Additional Comments:  Goal is to bring blood pressure down  Azalee Course 08/16/2023, 9:21 AM

## 2023-08-16 NOTE — Plan of Care (Signed)
 ?  Problem: Activity: ?Goal: Interest or engagement in activities will improve ?Outcome: Progressing ?Goal: Sleeping patterns will improve ?Outcome: Progressing ?  ?Problem: Coping: ?Goal: Ability to verbalize frustrations and anger appropriately will improve ?Outcome: Progressing ?Goal: Ability to demonstrate self-control will improve ?Outcome: Progressing ?  ?Problem: Safety: ?Goal: Periods of time without injury will increase ?Outcome: Progressing ?  ?

## 2023-08-16 NOTE — [facility] Group Notes (Signed)
 Adult Psychoeducational Group Note  Date:  08/16/2023 Time:  11:23 PM  Group Topic/Focus:  Wrap-Up Group:   The focus of this group is to help patients review their daily goal of treatment and discuss progress on daily workbooks.  Participation Level:  Active  Participation Quality:  Appropriate  Affect:  Appropriate  Cognitive:  Appropriate  Insight: Appropriate  Engagement in Group:  Engaged  Modes of Intervention:  Discussion and Support  Additional Comments:  This group discussed short-term goal planning for the coming week.  Christ Kick 08/16/2023, 11:23 PM

## 2023-08-17 DIAGNOSIS — R45851 Suicidal ideations: Secondary | ICD-10-CM | POA: Diagnosis not present

## 2023-08-17 DIAGNOSIS — Z8659 Personal history of other mental and behavioral disorders: Secondary | ICD-10-CM | POA: Diagnosis not present

## 2023-08-17 DIAGNOSIS — F1090 Alcohol use, unspecified, uncomplicated: Secondary | ICD-10-CM | POA: Diagnosis not present

## 2023-08-17 DIAGNOSIS — F332 Major depressive disorder, recurrent severe without psychotic features: Secondary | ICD-10-CM | POA: Diagnosis not present

## 2023-08-17 NOTE — Progress Notes (Signed)
 [facility] MD Progress Note  08/17/2023 9:53 AM Raymond [name]  MRN:  [id number] Principal Problem: Major depressive disorder, recurrent severe without psychotic features (HCC) Diagnosis: Principal Problem:   Major depressive disorder, recurrent severe without psychotic features (HCC)  Total Time spent with patient: 45 minutes  Reason for admission:  Raymond [name] is a 43 year old Caucasian male with prior psychiatric history of anxiety, panic attacks, depression, PTSD, who presents voluntarily to [facility] from [facility] ED after stabilization for worsening depression, anxiety, panic attack, resulting in suicidal ideation by intentional overdosing on 90 pills of 500 mg Keppra and trazodone.   Today's assessment note:  Patient discharged date was rescheduled for today.  However, he reports that his blood pressure which was 145/103 and came down to 108/91 with pulse of 71 was too high to be discharge.  The attending psychiatrist decided to keep the patient another day in the hospital per his request.  Expected date of discharge is scheduled again for tomorrow 08/18/2023.  Patient reports that his mood is better and less depressed. Reports anxiety is at manageable level.  He continues to be worried about his cancer treatment but was reminded to call the cancer Center at [facility] to discuss his appointment.  He reports sleep is better, appetite and concentration are stable.  No suicidal thought, no homicidal thought, no paranoia, and no psychotic symptoms.  Reiterated discharge planning for tomorrow and patient is in agreement.  He denies seizures activity today nor the past 24 hours.   Past Psychiatric History: Previous Psych Diagnoses: Major depressive disorder, recurrent severe without psychotic features, amphetamine abuse, cocaine abuse, substance-induced mood disorder, with suicide attempt.  Prior inpatient treatment: Admitted to [facility]  behavioral health in [place] in 2020 for suicide attempt for 1 month.  Current/prior outpatient treatment: None Prior rehab hx: Rehab for alcohol x 1 month in 2019 Psychotherapy hx: Yes History of suicide: X multiple attempts History of homicide or aggression: Denies Psychiatric medication history: Thorazine, fluoxetine, hydroxyzine, risperidone, and trazodone. Psychiatric medication compliance history: Noncompliance Neuromodulation history: Denies  Current Psychiatrist: Denies Current therapist: Seeing Graciella Freer at the [place]   Past Medical History:  Past Medical History:  Diagnosis Date   Alcohol abuse    Anxiety    Hepatitis C    PTSD (post-traumatic stress disorder)     Past Surgical History:  Procedure Laterality Date   FACIAL FRACTURE SURGERY     metal plate under right eye   Family History: History reviewed. No pertinent family history. Family Psychiatric  History: See H&P Social History:  Social History   Substance and Sexual Activity  Alcohol Use Yes   Comment: 1 liter bottle of vodka prior to arrival     Social History   Substance and Sexual Activity  Drug Use Yes   Types: Methamphetamines    Social History   Socioeconomic History   Marital status: Single    Spouse name: Not on file   Number of children: Not on file   Years of education: Not on file   Highest education level: Not on file  Occupational History   Not on file  Tobacco Use   Smoking status: Every Day    Current packs/day: 1.00    Types: Cigarettes    Passive exposure: Current   Smokeless tobacco: Never  Vaping Use   Vaping status: Never Used  Substance and Sexual Activity   Alcohol use: Yes    Comment: 1 liter bottle  of vodka prior to arrival   Drug use: Yes    Types: Methamphetamines   Sexual activity: Not Currently  Other Topics Concern   Not on file  Social History Narrative   Not on file   Social Drivers of Health   Financial Resource Strain: Not on File  (08/18/2022)   Received from [employer] [employer]    Financial Resource Strain: 0  Food Insecurity: No Food Insecurity (08/09/2023)   Hunger Vital Sign    Worried About Running Out of Food in the Last Year: Never true    Ran Out of Food in the Last Year: Never true  Transportation Needs: No Transportation Needs (08/09/2023)   PRAPARE - Administrator, Civil Service (Medical): No    Lack of Transportation (Non-Medical): No  Physical Activity: Not on File (08/18/2022)   Received from [place], [place]   Physical Activity    Physical Activity: 0  Stress: Not on File (08/18/2022)   Received from [facility], [place]   Stress    Stress: 0  Social Connections: Not on File (02/15/2023)   Received from [facility]   Social Connections    Connectedness: 0   Additional Social History:    Current Medications: Current Facility-Administered Medications  Medication Dose Route Frequency Provider Last Rate Last Admin   albuterol (VENTOLIN HFA) 108 (90 Base) MCG/ACT inhaler 2 puff  2 puff Inhalation Q4H PRN Starleen Blue, NP   2 puff at 08/16/23 2122   alum & mag hydroxide-simeth (MAALOX/MYLANTA) 200-200-20 MG/5ML suspension 30 mL  30 mL Oral Q4H PRN Dahlia Byes C, NP       cloNIDine (CATAPRES) tablet 0.1 mg  0.1 mg Oral Q4H PRN Massengill, Harrold Donath, MD   0.1 mg at 08/17/23 9147   cloNIDine (CATAPRES) tablet 0.1 mg  0.1 mg Oral Q12H Massengill, Harrold Donath, MD   0.1 mg at 08/17/23 0900   diphenhydrAMINE (BENADRYL) capsule 50 mg  50 mg Oral Q6H PRN Dahlia Byes C, NP       Or   diphenhydrAMINE (BENADRYL) injection 50 mg  50 mg Intramuscular Q6H PRN Onuoha, Josephine C, NP       haloperidol (HALDOL) tablet 5 mg  5 mg Oral TID PRN Dahlia Byes C, NP       And   diphenhydrAMINE (BENADRYL) capsule 50 mg  50 mg Oral TID PRN Dahlia Byes C, NP       haloperidol lactate (HALDOL) injection 5 mg  5 mg Intramuscular TID PRN Dahlia Byes C, NP       And   diphenhydrAMINE  (BENADRYL) injection 50 mg  50 mg Intramuscular TID PRN Dahlia Byes C, NP       And   LORazepam (ATIVAN) injection 2 mg  2 mg Intramuscular TID PRN Dahlia Byes C, NP       haloperidol lactate (HALDOL) injection 10 mg  10 mg Intramuscular TID PRN Dahlia Byes C, NP       And   diphenhydrAMINE (BENADRYL) injection 50 mg  50 mg Intramuscular TID PRN Dahlia Byes C, NP       And   LORazepam (ATIVAN) injection 2 mg  2 mg Intramuscular TID PRN Dahlia Byes C, NP       docusate sodium (COLACE) capsule 100 mg  100 mg Oral BID Massengill, Harrold Donath, MD   100 mg at 08/17/23 0900   FLUoxetine (PROZAC) capsule 60 mg  60 mg Oral Daily Massengill, Harrold Donath, MD   60 mg  at 08/17/23 0900   ibuprofen (ADVIL) tablet 600 mg  600 mg Oral Q6H PRN Bobbitt, Shalon E, NP   600 mg at 08/15/23 2152   levETIRAcetam (KEPPRA) tablet 750 mg  750 mg Oral BID Bobbitt, Shalon E, NP   750 mg at 08/17/23 0900   lisinopril (ZESTRIL) tablet 10 mg  10 mg Oral Daily Bobbitt, Shalon E, NP   10 mg at 08/17/23 0900   nicotine polacrilex (NICORETTE) gum 2 mg  2 mg Oral PRN Abbott Pao, Nadir, MD   2 mg at 08/17/23 0919   pantoprazole (PROTONIX) EC tablet 20 mg  20 mg Oral Daily Bennett, Christal H, NP   20 mg at 08/17/23 0900   polyethylene glycol (MIRALAX / GLYCOLAX) packet 17 g  17 g Oral Daily Massengill, Harrold Donath, MD   17 g at 08/16/23 0813   risperiDONE (RISPERDAL) tablet 2 mg  2 mg Oral QHS Massengill, Nathan, MD   2 mg at 08/16/23 2120   traZODone (DESYREL) tablet 150 mg  150 mg Oral QHS Onuoha, Josephine C, NP   150 mg at 08/16/23 2120   vitamin D3 (CHOLECALCIFEROL) tablet 1,000 Units  1,000 Units Oral Daily Bennett, Christal H, NP   1,000 Units at 08/17/23 0900   Lab Results:  No results found for this or any previous visit (from the past 48 hours).  Blood Alcohol level:  Lab Results  Component Value Date   ETH <10 08/08/2023   ETH 297 (H) 06/27/2023   Metabolic Disorder Labs: Lab Results  Component Value  Date   HGBA1C 5.3 08/11/2023   MPG 105.41 08/11/2023   MPG 108.28 03/28/2023   Lab Results  Component Value Date   PROLACTIN 12.0 01/22/2023   PROLACTIN 19.5 11/20/2022   Lab Results  Component Value Date   CHOL 172 08/11/2023   TRIG 146 08/11/2023   HDL 53 08/11/2023   CHOLHDL 3.2 08/11/2023   VLDL 29 08/11/2023   LDLCALC 90 08/11/2023   LDLCALC 84 03/28/2023   Physical Findings: AIMS: Facial and Oral Movements Muscles of Facial Expression: None Lips and Perioral Area: None Jaw: None Tongue: None,Extremity Movements Upper (arms, wrists, hands, fingers): None Lower (legs, knees, ankles, toes): None, Trunk Movements Neck, shoulders, hips: None, Global Judgements Severity of abnormal movements overall : None Patient's awareness of abnormal movements:  (N/A), Dental Status Current problems with teeth and/or dentures?: No Does patient usually wear dentures?: No Edentia?: No  CIWA:   N/A COWS:  N/A   Musculoskeletal: Strength & Muscle Tone: within normal limits Gait & Station: normal Patient leans: N/A  Psychiatric Specialty Exam:  Presentation  General Appearance:  Appropriate for Environment; Casual; Fairly Groomed  Eye Contact: Good  Speech: Clear and Coherent; Normal Rate  Speech Volume: Normal  Handedness: Right  Mood and Affect  Mood: Euthymic  Affect: Congruent  Thought Process  Thought Processes: Coherent; Linear  Descriptions of Associations:Intact  Orientation:Full (Time, Place and Person)  Thought Content:Logical  History of Schizophrenia/Schizoaffective disorder:No  Duration of Psychotic Symptoms:No data recorded Hallucinations:Hallucinations: None Description of Visual Hallucinations: Denies   Ideas of Reference:None  Suicidal Thoughts:Suicidal Thoughts: No  Homicidal Thoughts:Homicidal Thoughts: No  Sensorium  Memory: Immediate Good  Judgment: Good  Insight: Good  Executive Functions   Concentration: Good  Attention Span: Good  Recall: Good  Fund of Knowledge: Fair  Language: Good  Psychomotor Activity  Psychomotor Activity: Psychomotor Activity: Normal  Assets  Assets: Communication Skills; Desire for Improvement; Housing; Physical Health; Resilience  Sleep  Sleep: Sleep: Good Number of Hours of Sleep: 10.5  Physical Exam: Physical Exam Vitals and nursing note reviewed.  Constitutional:      General: He is not in acute distress.    Appearance: He is not toxic-appearing.  HENT:     Head: Normocephalic.     Nose: Nose normal.     Mouth/Throat:     Mouth: Mucous membranes are moist.     Pharynx: Oropharynx is clear.  Eyes:     Extraocular Movements: Extraocular movements intact.  Cardiovascular:     Rate and Rhythm: Normal rate.     Pulses: Normal pulses.     Comments: Blood Pressure 125/99 Pulmonary:     Effort: Pulmonary effort is normal. No respiratory distress.  Abdominal:     Comments: Deferred  Genitourinary:    Comments: Deferred Musculoskeletal:     Comments: Deferred  Skin:    General: Skin is warm.  Neurological:     General: No focal deficit present.     Mental Status: He is alert and oriented to person, place, and time.  Psychiatric:        Mood and Affect: Mood normal.        Behavior: Behavior normal.    Review of Systems  Constitutional: Negative.   Respiratory:  Negative for shortness of breath.   Cardiovascular:  Negative for chest pain and palpitations.  Gastrointestinal:  Negative for constipation, diarrhea, nausea and vomiting.  Skin: Negative.   Neurological:  Positive for seizures (Documented history of seizures). Negative for dizziness, tingling, tremors and headaches.  Endo/Heme/Allergies:        See allergy listing  Psychiatric/Behavioral:  Positive for depression and hallucinations. Negative for suicidal ideas. The patient is nervous/anxious. The patient does not have insomnia.    Blood pressure  (!) 108/91, pulse 71, temperature 98 F (36.7 C), temperature source Oral, resp. rate 16, height 6\' 1"  (1.854 m), weight 77.2 kg, SpO2 98%. Body mass index is 22.46 kg/m.  Treatment Plan Summary: Daily contact with patient to assess and evaluate symptoms and progress in treatment and Medication management Physician Treatment Plan for Primary Diagnosis: Assessment: Raymond [name] is a 43 year old Caucasian male with prior psychiatric history of anxiety, panic attacks, depression, PTSD, who presents voluntarily to [facility] from [facility] ED after stabilization for worsening depression, anxiety, panic attack, resulting in suicidal ideation by intentional overdosing on 90 pills of 500 mg Keppra and trazodone.     Active Problems:   Major depressive disorder, recurrent severe without psychotic features (HCC) Generalized anxiety disorder PTSD Seizure disorder   Plans: Medications: -- Continue Prozac capsule 60 mg, oral, daily for depression and anxiety -- Continue Risperdal tablet 2 mg oral, daily at bedtime for psychosis -- Continue trazodone 150 mg, oral, daily at bedtime for insomnia   Medications for other medical problems: -- Continue Keppra tablet 750 mg p.o. twice daily to prevent seizures -- Nicotine gum 2 mg p.o. at needed for smoking cessation -- Lorazepam tablet 2 mg every 6 hours as needed for anxiety, seizures -- Lisinopril Tablet 10 mg p.o. daily for high blood pressure -- Continue vitamin D3 tablet 1000 units, oral, daily for vitamin D deficiency -- Continue Protonix EC tablet 20 mg, oral, daily for GERD   Continue BH Agitation Protocol --Haldol 5 mg, oral, 3 times daily as needed, mild agitation --Benadryl 50 mg, oral, 3 times daily as needed, mild agitation  OR  --Haldol injection 5 mg, IM, 3 times daily as needed, moderate agitation --Benadryl injection 50 mg, IM, 3 times daily as needed, moderate  agitation --Ativan injection 2 mg, IM, 3 times daily as needed, moderate agitation                                     OR --Haldol injection 10 mg, IM, 3 times daily as needed, severe agitation --Benadryl injection 50 mg, IM, 3 times daily as needed, severe agitation --Ativan injection 2 mg, IM, 3 times daily as needed, severe agitation   --  The risks/benefits/side-effects/alternatives to this medication were discussed in detail with the patient and time was given for questions. The patient consents to medication trial.  -- FDA             -- Metabolic profile and EKG monitoring obtained while on an atypical antipsychotic (BMI: Lipid Panel: HbgA1c: QTc:)              -- Encouraged patient to participate in unit milieu and in scheduled group therapies    Other PRN Medications -Acetaminophen 650 mg every 6 as needed/mild pain -Maalox 30 mL oral every 4 as needed/digestion -Magnesium hydroxide 30 mL daily as needed/mild constipation   --The risks/benefits/side-effects/alternatives to this medication were discussed in detail with the patient and time was given for questions. The patient consents to medication trial.  -- Metabolic profile and EKG monitoring obtained while on an atypical antipsychotic (BMI: Lipid Panel: HbgA1c: QTc:)  -- Encouraged patient to participate in unit milieu and in scheduled group therapies    Admission labs reviewed: CMP: CO2 17 low, glucose 111 high, otherwise normal.  CBC: Within normal limits.  BAL: Less than 10.  UDS: Positive for marijuana   New lab orders reviewed Vitamin D low at 27.91 TSH, Lipid panel, Hemoglobin A1c all within defined ranges.   EKG reviewed: Sinus rhythm, ventricular rate 70, QT/QTc at bedtime 366/395   Safety and Monitoring: Voluntary admission to inpatient psychiatric unit for safety, stabilization and treatment Daily contact with patient to assess and evaluate symptoms and progress in treatment Patient's case to be discussed in  multi-disciplinary team meeting Observation Level : q15 minute checks Vital signs: q12 hours Precautions: suicide, but pt currently verbally contracts for safety on unit    Discharge Planning: Social work and case management to assist with discharge planning and identification of hospital follow-up needs prior to discharge Estimated LOS: tomorrow 3-16 Discharge Concerns: Need to establish a safety plan; Medication compliance and effectiveness Discharge Goals: Return home with outpatient referrals for mental health follow-up including medication management/psychotherapy.  I certify that inpatient services furnished can reasonably be expected to improve the patient's condition.    Cecilie Lowers, FNP 08/17/2023, 9:53 AM  Patient ID: Raymond [name], male   DOB: Aug 14, 1980, 43 y.o.   MRN: [id number]

## 2023-08-17 NOTE — Plan of Care (Signed)
 Nurse discussed anxiety, depression and coping skills with patient.

## 2023-08-17 NOTE — Group Note (Signed)
 Therapy Group Note  Group Topic:Other  Group Date: 08/17/2023 Start Time: 1430 End Time: 1500 Facilitators: Ted Mcalpine, OT   The primary objective of this topic is to explore and understand the concept of occupational balance in the context of daily living. The term "occupational balance" is defined broadly, encompassing all activities that occupy an individual's time and energy, including self-care, leisure, and work-related tasks. The goal is to guide participants towards achieving a harmonious blend of these activities, tailored to their personal values and life circumstances. This balance is aimed at enhancing overall well-being, not by equally distributing time across activities, but by ensuring that daily engagements are fulfilling and not draining. The content delves into identifying various barriers that individuals face in achieving occupational balance, such as overcommitment, misaligned priorities, external pressures, and lack of effective time management. The impact of these barriers on occupational performance, roles, and lifestyles is examined, highlighting issues like reduced efficiency, strained relationships, and potential health problems. Strategies for cultivating occupational balance are a key focus. These strategies include practical methods like time blocking, prioritizing tasks, establishing self-care rituals, decluttering, connecting with nature, and engaging in reflective practices. These approaches are designed to be adaptable and applicable to a wide range of life scenarios, promoting a proactive and mindful approach to daily living. The overall aim is to equip participants with the knowledge and tools to create a balanced lifestyle that supports their mental, emotional, and physical health, thereby improving their functional performance in daily life.      Participation Level: Engaged   Participation Quality: Independent   Behavior: Appropriate   Speech/Thought  Process: Relevant   Affect/Mood: Appropriate   Insight: Fair   Judgement: Fair      Modes of Intervention: Education  Patient Response to Interventions:  Engaged   Plan: Continue to engage patient in OT groups 2 - 3x/week.  08/17/2023  Ted Mcalpine, OT  Kerrin Champagne, OT

## 2023-08-17 NOTE — [facility] Group Notes (Signed)
 Group Goal: Support / Education around grief and loss  Members engage in facilitated group support and psycho-social education.  Group Description: Following introductions and group rules, group members engaged in facilitated group dialog and support around topic of loss, with particular support around experiences of loss in their lives. Group Identified types of loss (relationships / self / things) and identified patterns, circumstances, and changes that precipitate losses. Reflected on thoughts / feelings around loss, normalized grief responses, and recognized variety in grief experience. Group noted Worden's four tasks of grief in discussion.  Group drew on Adlerian / Rogerian, narrative, MI, and group theory of [facility] [employer].  Patient Progress: Buck was present for part of the group but left and returned only at the end. He stated feeling frustrated and unheard likely when another peer tended to dominate. I emphasized the importance of his contribution and made myself available for continued support. Carver Murakami L. Sophronia Simas, M.Div [phone] Amos Gaber L. Sophronia Simas, M.Div [phone]

## 2023-08-17 NOTE — [facility] Suicide Risk Assessment (Signed)
 Suicide Risk Assessment  Discharge Assessment    [facility] Discharge Suicide Risk Assessment   Principal Problem: Major depressive disorder, recurrent severe without psychotic features [facility]) Discharge Diagnoses: Principal Problem:   Major depressive disorder, recurrent severe without psychotic features (HCC)  Reason for admission:  Raymond [name] is a 43 year old Caucasian male with prior psychiatric history of anxiety, panic attacks, depression, PTSD, who presents voluntarily to [facility] from [facility] ED after stabilization for worsening depression, anxiety, panic attack, resulting in suicidal ideation by intentional overdosing on 90 pills of 500 mg Keppra and trazodone.   Total Time spent with patient: 30 minutes  Musculoskeletal: Strength & Muscle Tone: within normal limits Gait & Station: normal Patient leans: N/A  Psychiatric Specialty Exam  Presentation  General Appearance:  Appropriate for Environment; Casual; Fairly Groomed  Eye Contact: Good  Speech: Normal Rate; Clear and Coherent  Speech Volume: Normal  Handedness: Right  Mood and Affect  Mood: Anxious  Duration of Depression Symptoms: Greater than two weeks  Affect: Appropriate; Congruent; Full Range  Thought Process  Thought Processes: Linear  Descriptions of Associations:Intact  Orientation:Full (Time, Place and Person)  Thought Content:Logical  History of Schizophrenia/Schizoaffective disorder:No  Duration of Psychotic Symptoms:No data recorded Hallucinations:No data recorded Ideas of Reference:None  Suicidal Thoughts:No data recorded Homicidal Thoughts:No data recorded  Sensorium  Memory: Immediate Good; Recent Good; Remote Good  Judgment: Good  Insight: Good  Executive Functions  Concentration: Good  Attention Span: Good  Recall: Good  Fund of Knowledge: Good  Language: Good  Psychomotor Activity  Psychomotor Activity:No data  recorded  Assets  Assets: Communication Skills; Desire for Improvement; Resilience; Social Support  Sleep  Sleep:No data recorded  Physical Exam: Physical Exam Vitals and nursing note reviewed.  Constitutional:      Appearance: He is normal weight.  HENT:     Head: Normocephalic.     Right Ear: External ear normal.     Left Ear: External ear normal.     Nose: Nose normal.     Mouth/Throat:     Mouth: Mucous membranes are moist.     Pharynx: Oropharynx is clear.  Cardiovascular:     Rate and Rhythm: Normal rate.     Pulses: Normal pulses.  Pulmonary:     Effort: Pulmonary effort is normal.  Abdominal:     Comments: Deferred  Genitourinary:    Comments: Deferred Musculoskeletal:        General: Normal range of motion.     Cervical back: Normal range of motion.  Skin:    General: Skin is warm.  Neurological:     General: No focal deficit present.     Mental Status: He is alert and oriented to person, place, and time.  Psychiatric:        Mood and Affect: Mood normal.        Behavior: Behavior normal.        Thought Content: Thought content normal.    Review of Systems  Constitutional:  Negative for chills and fever.  HENT:  Negative for sore throat.   Eyes:  Negative for blurred vision.  Respiratory:  Negative for cough, sputum production, shortness of breath and wheezing.   Gastrointestinal:  Negative for abdominal pain, constipation, diarrhea, heartburn, nausea and vomiting.  Genitourinary:  Negative for dysuria, frequency and urgency.  Musculoskeletal:  Negative for myalgias.  Skin:  Negative for itching and rash.  Neurological:  Negative for dizziness, tingling and headaches.  Endo/Heme/Allergies:  See allergy listing  Psychiatric/Behavioral:  Positive for depression (Stable with medication). Negative for hallucinations, substance abuse and suicidal ideas. The patient is nervous/anxious (Stable with medication). The patient does not have insomnia.     Blood pressure (!) 108/91, pulse 71, temperature 98 F (36.7 C), temperature source Oral, resp. rate 16, height 6\' 1"  (1.854 m), weight 77.2 kg, SpO2 98%. Body mass index is 22.46 kg/m.  Mental Status Per Nursing Assessment::   On Admission:  Suicidal ideation indicated by patient  Demographic Factors:  Male, Caucasian, Low socioeconomic status, and Unemployed  Loss Factors: Financial problems/change in socioeconomic status  Historical Factors: Prior suicide attempts and Impulsivity  Risk Reduction Factors:   Positive social support, Positive therapeutic relationship, and Positive coping skills or problem solving skills  Continued Clinical Symptoms:  Depression:   Recent sense of peace/wellbeing Chronic Pain More than one psychiatric diagnosis Previous Psychiatric Diagnoses and Treatments  Cognitive Features That Contribute To Risk:  Polarized thinking    Suicide Risk:  Mild: There are no identifiable plans, no associated intent, mild dysphoria and related symptoms, good self-control (both objective and subjective assessment), few other risk factors, and identifiable protective factors, including available and accessible social support.   Follow-up Information     [facility] Follow up on 08/24/2023.   Why: You have a hospital follow up appointment for therapy and medication management services on 08/24/23 at 9:00 am . This will be a Virtual telehealth appointment. Contact information: [address]  [address] [place] [address] [phone]                Plan Of Care/Follow-up recommendations:  Discharge Recommendations:  The patient is being discharged to home. Patient is to take his discharge medications as ordered.  See follow up above. We recommend that he participates in individual therapy to target uncontrollable agitation and substance abuse.  We recommend that he participates in therapy to target personal conflict, to improve communication skills  and conflict resolution skills. Patient is to initiate/implement a contingency based behavioral model to address his behavior. We recommend that he gets AIMS scale, height, weight, blood pressure, fasting lipid panel, fasting blood sugar in three months from discharge if he's on atypical antipsychotics.  Patient will benefit from monitoring of recurrent suicidal ideation since patient is on antidepressant medication. The patient should abstain from all illicit substances and alcohol. If the patient's symptoms worsen or do not continue to improve or if the patient becomes actively suicidal or homicidal then it is recommended that the patient return to the closest hospital emergency room or call 911 for further evaluation and treatment. National Suicide Prevention Lifeline 1800-SUICIDE or 709-593-5211. Please follow up with your primary medical doctor for all other medical needs.  The patient has been educated on the possible side effects to medications and she/her guardian is to contact a medical professional and inform outpatient provider of any new side effects of medication. He is to take regular diet and activity as tolerated.  Will benefit from moderate daily exercise. Patient and Family was educated about removing/locking any firearms, medications or dangerous products from the home.  Activity:  As tolerated Diet:  Regular Diet  Cecilie Lowers, FNP 08/18/2023, 12:07 PM

## 2023-08-17 NOTE — [facility] Group Notes (Signed)
Patient attended the AA group. 

## 2023-08-17 NOTE — Progress Notes (Signed)
D:  Patient denied SI and HI, contracts for safety.  Denied A/V hallucinations.   A:  Medications administered per MD orders.  Emotional support and encouragement given patient. R:  Safety maintained with 15 minute checks.  

## 2023-08-17 NOTE — Group Note (Signed)
 Recreation Therapy Group Note   Group Topic:Stress Management  Group Date: 08/17/2023 Start Time: 0945 End Time: 1000 Facilitators: Kalan Rinn-McCall, LRT,CTRS Location: [address] Dayroom   Group Topic: Stress Management   Goal Area(s) Addresses:  Patient will actively participate in stress management techniques presented during session.  Patient will successfully identify benefit of practicing stress management post d/c.   Intervention: Relaxation exercise with ambient sound and script   Activity: Guided Imagery. LRT provided education, instruction, and demonstration on practice of visualization via guided imagery. Patient was asked to participate in the technique introduced during session. LRT debriefed including topics of mindfulness, stress management and specific scenarios each patient could use these techniques. Patients were given suggestions of ways to access scripts post d/c and encouraged to explore Youtube and other apps available on smartphones, tablets, and computers.  Education:  Stress Management, Discharge Planning.   Education Outcome: Acknowledges education   Affect/Mood: N/A   Participation Level: Did not attend    Clinical Observations/Individualized Feedback:      Plan: Continue to engage patient in RT group sessions 2-3x/week.   Cherron Blitzer-McCall, LRT,CTRS 08/17/2023 1:18 PM

## 2023-08-17 NOTE — Group Note (Signed)
 Date:  08/17/2023 Time:  11:30 AM  Group Topic/Focus:  Orientation:   The focus of this group is to educate the patient on the purpose and policies of crisis stabilization and provide a format to answer questions about their admission.  The group details unit policies and expectations of patients while admitted.    Participation Level:  Active  Participation Quality:  Appropriate  Affect:  Appropriate  Cognitive:  Appropriate  Insight: Appropriate  Engagement in Group:  Engaged  Modes of Intervention:  Education  Additional Comments:    Shanisha Lech D Layce Sprung 08/17/2023, 11:30 AM

## 2023-08-18 DIAGNOSIS — Z8659 Personal history of other mental and behavioral disorders: Secondary | ICD-10-CM | POA: Diagnosis not present

## 2023-08-18 DIAGNOSIS — R45851 Suicidal ideations: Secondary | ICD-10-CM | POA: Diagnosis not present

## 2023-08-18 DIAGNOSIS — F332 Major depressive disorder, recurrent severe without psychotic features: Secondary | ICD-10-CM | POA: Diagnosis not present

## 2023-08-18 DIAGNOSIS — F1090 Alcohol use, unspecified, uncomplicated: Secondary | ICD-10-CM | POA: Diagnosis not present

## 2023-08-18 MED ORDER — VITAMIN D3 25 MCG PO TABS: 1000.0000 [IU] | ORAL_TABLET | Freq: Every day | ORAL | 0 refills | Status: DC

## 2023-08-18 MED ORDER — CLONIDINE HCL 0.1 MG PO TABS: 0.1000 mg | ORAL_TABLET | Freq: Two times a day (BID) | ORAL | 11 refills | Status: DC

## 2023-08-18 MED ORDER — PANTOPRAZOLE SODIUM 20 MG PO TBEC: 20.0000 mg | DELAYED_RELEASE_TABLET | Freq: Every day | ORAL | 0 refills | Status: DC

## 2023-08-18 MED ORDER — FLUOXETINE HCL 20 MG PO CAPS: 60.0000 mg | ORAL_CAPSULE | Freq: Every day | ORAL | 3 refills | Status: DC

## 2023-08-18 MED ORDER — LEVETIRACETAM 750 MG PO TABS: 750.0000 mg | ORAL_TABLET | Freq: Two times a day (BID) | ORAL | 0 refills | Status: DC

## 2023-08-18 MED ORDER — RISPERIDONE 2 MG PO TABS: 2.0000 mg | ORAL_TABLET | Freq: Every day | ORAL | 0 refills | Status: DC

## 2023-08-18 MED ORDER — TRAZODONE HCL 150 MG PO TABS: 150.0000 mg | ORAL_TABLET | Freq: Every day | ORAL | 0 refills | Status: DC

## 2023-08-18 MED ORDER — POLYETHYLENE GLYCOL 3350 17 G PO PACK: 17.0000 g | PACK | Freq: Every day | ORAL | 0 refills | Status: DC

## 2023-08-18 MED ORDER — NICOTINE POLACRILEX 2 MG MT GUM: 2.0000 mg | CHEWING_GUM | OROMUCOSAL | 0 refills | Status: DC | PRN

## 2023-08-18 MED ORDER — DOCUSATE SODIUM 100 MG PO CAPS: 100.0000 mg | ORAL_CAPSULE | Freq: Two times a day (BID) | ORAL | 0 refills | Status: DC

## 2023-08-18 NOTE — Plan of Care (Signed)
  Problem: Education: Goal: Knowledge of [facility] Education information/materials will improve Outcome: Adequate for Discharge Goal: Emotional status will improve 08/18/2023 1138 by Gardiner Barefoot, RN Outcome: Adequate for Discharge 08/18/2023 1137 by Gardiner Barefoot, RN Outcome: Progressing Goal: Mental status will improve 08/18/2023 1138 by Gardiner Barefoot, RN Outcome: Adequate for Discharge 08/18/2023 1137 by Gardiner Barefoot, RN Outcome: Progressing Goal: Verbalization of understanding the information provided will improve 08/18/2023 1138 by Gardiner Barefoot, RN Outcome: Adequate for Discharge 08/18/2023 1137 by Gardiner Barefoot, RN Outcome: Progressing   Problem: Activity: Goal: Interest or engagement in activities will improve Outcome: Adequate for Discharge Goal: Sleeping patterns will improve Outcome: Adequate for Discharge   Problem: Coping: Goal: Ability to verbalize frustrations and anger appropriately will improve Outcome: Adequate for Discharge Goal: Ability to demonstrate self-control will improve Outcome: Adequate for Discharge   Problem: Health Behavior/Discharge Planning: Goal: Identification of resources available to assist in meeting health care needs will improve Outcome: Adequate for Discharge Goal: Compliance with treatment plan for underlying cause of condition will improve Outcome: Adequate for Discharge   Problem: Physical Regulation: Goal: Ability to maintain clinical measurements within normal limits will improve Outcome: Adequate for Discharge   Problem: Safety: Goal: Periods of time without injury will increase Outcome: Adequate for Discharge

## 2023-08-18 NOTE — Progress Notes (Signed)
   08/18/23 0830  Psych Admission Type (Psych Patients Only)  Admission Status Voluntary  Psychosocial Assessment  Patient Complaints Anxiety  Eye Contact Fair  Facial Expression Animated  Affect Appropriate to circumstance  Speech Logical/coherent  Interaction Assertive  Motor Activity Fidgety  Appearance/Hygiene Unremarkable  Behavior Characteristics Cooperative;Appropriate to situation  Mood Pleasant  Thought Process  Coherency WDL  Content WDL  Delusions None reported or observed  Perception WDL  Hallucination None reported or observed  Judgment Impaired  Confusion None  Danger to Self  Current suicidal ideation? Denies  Agreement Not to Harm Self Yes  Description of Agreement Verbal  Danger to Others  Danger to Others None reported or observed

## 2023-08-18 NOTE — Plan of Care (Signed)
   Problem: Education: Goal: Emotional status will improve Outcome: Progressing Goal: Mental status will improve Outcome: Progressing Goal: Verbalization of understanding the information provided will improve Outcome: Progressing

## 2023-08-18 NOTE — Progress Notes (Signed)
  [facility] Adult Case Management Discharge Plan :  Will you be returning to the same living situation after discharge:  Yes,  has housing in [place] At discharge, do you have transportation home?: No. Do you have the ability to pay for your medications: Yes,  has [employer]  Release of information consent forms completed and in the chart;  Patient's signature needed at discharge.  Patient to Follow up at:  Follow-up Information     [facility] Follow up on 08/24/2023.   Why: You have a hospital follow up appointment for therapy and medication management services on 08/24/23 at 9:00 am . This will be a Virtual telehealth appointment. Contact information: [address]  [address] [place] [address] [phone]                 Next level of care provider has access to [facility] Link:no  Safety Planning and Suicide Prevention discussed: Yes,  with Patient     Has patient been referred to the Quitline?: Patient refused referral for treatment  Patient has been referred for addiction treatment: Patient refused referral for treatment.  Marinda Elk, LCSW 08/18/2023, 9:42 AM

## 2023-08-18 NOTE — Group Note (Signed)
 Recreation Therapy Group Note   Group Topic:Animal Assisted Therapy   Group Date: 08/18/2023 Start Time: 0945 End Time: 1030 Facilitators: Mira Balon-McCall, LRT,CTRS Location: [address] Dayroom   Animal-Assisted Activity (AAA) Program Checklist/Progress Notes Patient Eligibility Criteria Checklist & Daily Group note for Rec Tx Intervention  AAA/T Program Assumption of Risk Form signed by Patient/ or Parent Legal Guardian Yes  Patient is free of allergies or severe asthma Yes  Patient reports no fear of animals Yes  Patient reports no history of cruelty to animals Yes  Patient understands his/her participation is voluntary Yes  Patient washes hands before animal contact Yes  Patient washes hands after animal contact Yes  Education: Hand Washing, Appropriate Animal Interaction   Education Outcome: Acknowledges education.    Affect/Mood: Appropriate   Participation Level: Engaged   Participation Quality: Independent   Behavior: Appropriate   Speech/Thought Process: Focused   Insight: Good   Judgement: Good   Modes of Intervention: Teaching laboratory technician   Patient Response to Interventions:  Engaged   Education Outcome:  In group clarification offered    Clinical Observations/Individualized Feedback: Patient attended session and interacted appropriately with therapy dog and peers. Patient asked appropriate questions about therapy dog and his training. Patient shared stories about their pets at home with group. Patient would also needed direction at times from getting extremely loud when talking in group.      Plan: Continue to engage patient in RT group sessions 2-3x/week.   Rufino Staup-McCall, LRT,CTRS 08/18/2023 2:06 PM

## 2023-08-18 NOTE — Plan of Care (Signed)

## 2023-08-18 NOTE — Progress Notes (Signed)
   08/18/23 0000  Psych Admission Type (Psych Patients Only)  Admission Status Voluntary  Psychosocial Assessment  Patient Complaints Anxiety  Eye Contact Fair  Facial Expression Animated  Affect Appropriate to circumstance  Speech Logical/coherent  Interaction Assertive  Motor Activity Fidgety  Appearance/Hygiene Unremarkable  Behavior Characteristics Appropriate to situation  Mood Euthymic  Thought Process  Coherency WDL  Content WDL  Delusions None reported or observed  Perception WDL  Hallucination None reported or observed  Judgment Impaired  Confusion None  Danger to Self  Current suicidal ideation? Denies  Description of Suicide Plan None  Agreement Not to Harm Self Yes  Description of Agreement Verbal contract for safety  Danger to Others  Danger to Others None reported or observed

## 2023-08-18 NOTE — Progress Notes (Signed)
 Patient ID: Raymond [name], male   DOB: [birth year], 43 y.o.   MRN: [id number]   Patient discharged via taxi. Denies SI/HI/AVH. No distress noted.

## 2023-08-18 NOTE — Discharge Summary (Signed)
 Physician Discharge Summary Note  Patient:  Raymond [name] is an 43 y.o., male MRN:  [id number] DOB:  1981/05/26 Patient phone:  [phone] (home)  Patient address:   [address] [place] [address],  Total Time spent with patient: 45 minutes  Date of Admission:  08/09/2023 Date of Discharge:  08/18/2023  Reason for Admission:  Raymond [name] is a 43 year old Caucasian male with prior psychiatric history of anxiety, panic attacks, depression, PTSD, who presents voluntarily to [facility] from [facility] ED after stabilization for worsening depression, anxiety, panic attack, resulting in suicidal ideation by intentional overdosing on 90 pills of 500 mg Keppra and trazodone.   Principal Problem: Major depressive disorder, recurrent severe without psychotic features [facility]) Discharge Diagnoses: Principal Problem:   Major depressive disorder, recurrent severe without psychotic features (HCC)  Past Psychiatric History: Previous Psych Diagnoses: Major depressive disorder, recurrent severe without psychotic features, amphetamine abuse, cocaine abuse, substance-induced mood disorder, with suicide attempt.  Prior inpatient treatment: Admitted to [facility] behavioral health in [place] in 2020 for suicide attempt for 1 month.  Current/prior outpatient treatment: None Prior rehab hx: Rehab for alcohol x 1 month in 2019 Psychotherapy hx: Yes History of suicide: X multiple attempts History of homicide or aggression: Denies Psychiatric medication history: Thorazine, fluoxetine, hydroxyzine, risperidone, and trazodone. Psychiatric medication compliance history: Noncompliance Neuromodulation history: Denies  Current Psychiatrist: Denies Current therapist: Seeing Graciella Freer at the [place]   Past Medical History:  Past Medical History:  Diagnosis Date   Alcohol abuse    Anxiety    Hepatitis C    PTSD (post-traumatic stress  disorder)     Past Surgical History:  Procedure Laterality Date   FACIAL FRACTURE SURGERY     metal plate under right eye   Family History: History reviewed. No pertinent family history. Family Psychiatric  History: See H&P Social History:  Social History   Substance and Sexual Activity  Alcohol Use Yes   Comment: 1 liter bottle of vodka prior to arrival     Social History   Substance and Sexual Activity  Drug Use Yes   Types: Methamphetamines    Social History   Socioeconomic History   Marital status: Single    Spouse name: Not on file   Number of children: Not on file   Years of education: Not on file   Highest education level: Not on file  Occupational History   Not on file  Tobacco Use   Smoking status: Every Day    Current packs/day: 1.00    Types: Cigarettes    Passive exposure: Current   Smokeless tobacco: Never  Vaping Use   Vaping status: Never Used  Substance and Sexual Activity   Alcohol use: Yes    Comment: 1 liter bottle of vodka prior to arrival   Drug use: Yes    Types: Methamphetamines   Sexual activity: Not Currently  Other Topics Concern   Not on file  Social History Narrative   Not on file   Social Drivers of Health   Financial Resource Strain: Not on File (08/18/2022)   Received from [facility]    Financial Resource Strain: 0  Food Insecurity: No Food Insecurity (08/09/2023)   Hunger Vital Sign    Worried About Running Out of Food in the Last Year: Never true    Ran Out of Food in the Last Year: Never true  Transportation Needs: No Transportation Needs (08/09/2023)  PRAPARE - Administrator, Civil Service (Medical): No    Lack of Transportation (Non-Medical): No  Physical Activity: Not on File (08/18/2022)   Received from [facility], [place]   Physical Activity    Physical Activity: 0  Stress: Not on File (08/18/2022)   Received from [facility], [place]   Stress    Stress: 0  Social Connections: Not on  File (02/15/2023)   Received from [facility]   Social Connections    Connectedness: 0   Hospital Course:  During the patient's hospitalization, patient had extensive initial psychiatric evaluation, and follow-up psychiatric evaluations every day.  Psychiatric diagnoses provided upon initial assessment:  Diagnosis:  Active Problems:   Major depressive disorder, recurrent severe without psychotic features (HCC) PTSD, hx Alcohol Use Disorder Suicidal Gesture with intentional ingestion of medication  Patient's psychiatric medications were adjusted on admission:  --Continue Prozac capsule 40 mg p.o. daily for depression and anxiety --Continue him Risperdal tablet 2 mg p.o. daily for psychosis --Continue Risperdal tablet 3 mg p.o. daily at bedtime for psychosis --Continue trazodone 150 mg p.o. daily at bedtime for insomnia  During the hospitalization, other adjustments were made to the patient's psychiatric medication regimen:  Risperdal 3 mg p.o. daily at bedtime for psychosis was discontinued Prozac capsule was increased to 60 mg p.o. daily for depression and anxiety  Patient's care was discussed during the interdisciplinary team meeting every day during the hospitalization.  The patient denies having side effects to prescribed psychiatric medication.  Gradually, patient started adjusting to milieu. The patient was evaluated each day by a clinical provider to ascertain response to treatment. Improvement was noted by the patient's report of decreasing symptoms, improved sleep and appetite, affect, medication tolerance, behavior, and participation in unit programming.  Patient was asked each day to complete a self inventory noting mood, mental status, pain, new symptoms, anxiety and concerns.    Symptoms were reported as significantly decreased or resolved completely by discharge.   On day of discharge, the patient reports that their mood is stable. The patient denied having suicidal thoughts for  more than 48 hours prior to discharge.  Patient denies having homicidal thoughts.  Patient denies having auditory hallucinations.  Patient denies any visual hallucinations or other symptoms of psychosis. The patient was motivated to continue taking medication with a goal of continued improvement in mental health.   The patient reports their target psychiatric symptoms of depression responded well to the psychiatric medications, and the patient reports overall benefit other psychiatric hospitalization. Supportive psychotherapy was provided to the patient. The patient also participated in regular group therapy while hospitalized. Coping skills, problem solving as well as relaxation therapies were also part of the unit programming.  Labs were reviewed with the patient, and abnormal results were discussed with the patient.  The patient is able to verbalize their individual safety plan to this provider.  # It is recommended to the patient to continue psychiatric medications as prescribed, after discharge from the hospital.    # It is recommended to the patient to follow up with your outpatient psychiatric provider and PCP.  # It was discussed with the patient, the impact of alcohol, drugs, tobacco have been there overall psychiatric and medical wellbeing, and total abstinence from substance use was recommended the patient.ed.  # Prescriptions provided or sent directly to preferred pharmacy at discharge. Patient agreeable to plan. Given opportunity to ask questions. Appears to feel comfortable with discharge.    # In the event of worsening  symptoms, the patient is instructed to call the crisis hotline, 911 and or go to the nearest ED for appropriate evaluation and treatment of symptoms. To follow-up with primary care provider for other medical issues, concerns and or health care needs  # Patient was discharged to his apartment with a plan to follow up as noted below.   Physical Findings: AIMS: Facial  and Oral Movements Muscles of Facial Expression: None Lips and Perioral Area: None Jaw: None Tongue: None,Extremity Movements Upper (arms, wrists, hands, fingers): None Lower (legs, knees, ankles, toes): None, Trunk Movements Neck, shoulders, hips: None, Global Judgements Severity of abnormal movements overall : None Patient's awareness of abnormal movements:  (N/A), Dental Status Current problems with teeth and/or dentures?: No Does patient usually wear dentures?: No [place]?: No  CIWA:    COWS:     Musculoskeletal: Strength & Muscle Tone: within normal limits Gait & Station: normal Patient leans: N/A  Psychiatric Specialty Exam:  Presentation  General Appearance:  Appropriate for Environment; Casual; Fairly Groomed  Eye Contact: Good  Speech: Clear and Coherent; Normal Rate  Speech Volume: Normal  Handedness: Right   Mood and Affect  Mood: Euthymic  Affect: Congruent  Thought Process  Thought Processes: Coherent; Linear  Descriptions of Associations:Intact  Orientation:Full (Time, Place and Person)  Thought Content:Logical  History of Schizophrenia/Schizoaffective disorder:No  Duration of Psychotic Symptoms:No data recorded Hallucinations:Hallucinations: None Description of Visual Hallucinations: Denies  Ideas of Reference:None  Suicidal Thoughts:Suicidal Thoughts: No  Homicidal Thoughts:Homicidal Thoughts: No  Sensorium  Memory: Immediate Good  Judgment: Good  Insight: Good  Executive Functions  Concentration: Good  Attention Span: Good  Recall: Good  Fund of Knowledge: Fair  Language: Good  Psychomotor Activity  Psychomotor Activity: Psychomotor Activity: Normal  Assets  Assets: Communication Skills; Desire for Improvement; Housing; Physical Health; Resilience  Sleep  Sleep: Sleep: Good Number of Hours of Sleep: 10.5  Physical Exam: Physical Exam Vitals and nursing note reviewed.  Constitutional:       General: He is not in acute distress.    Appearance: He is not ill-appearing or toxic-appearing.  HENT:     Head: Normocephalic.     Nose: Nose normal.     Mouth/Throat:     Mouth: Mucous membranes are moist.     Pharynx: Oropharynx is clear.  Eyes:     Extraocular Movements: Extraocular movements intact.  Cardiovascular:     Rate and Rhythm: Normal rate.     Pulses: Normal pulses.  Pulmonary:     Effort: Pulmonary effort is normal.  Abdominal:     Comments: Deferred  Musculoskeletal:     Comments: Deferred  Skin:    General: Skin is warm.  Neurological:     General: No focal deficit present.     Mental Status: He is alert and oriented to person, place, and time.  Psychiatric:        Mood and Affect: Mood normal.        Behavior: Behavior normal.        Thought Content: Thought content normal.    Review of Systems  Constitutional:  Negative for chills and fever.  HENT:  Negative for sore throat.   Eyes:  Negative for blurred vision.  Respiratory:  Negative for cough, sputum production, shortness of breath and wheezing.   Cardiovascular:  Negative for chest pain and palpitations.  Gastrointestinal:  Negative for abdominal pain, constipation, diarrhea, heartburn, nausea and vomiting.  Genitourinary:  Negative for dysuria, frequency and urgency.  Musculoskeletal:  Negative for myalgias.  Skin:  Negative for itching and rash.  Neurological:  Negative for dizziness, tingling, tremors and headaches.  Endo/Heme/Allergies:        See allergy listing  Psychiatric/Behavioral:  Positive for depression (Stable with medication). Negative for hallucinations, substance abuse and suicidal ideas. The patient is nervous/anxious (Improved with medication). The patient does not have insomnia.    Blood pressure (!) 95/55, pulse 80, temperature 98 F (36.7 C), temperature source Oral, resp. rate 16, height 6\' 1"  (1.854 m), weight 77.2 kg, SpO2 100%. Body mass index is 22.46 kg/m.  Social  History   Tobacco Use  Smoking Status Every Day   Current packs/day: 1.00   Types: Cigarettes   Passive exposure: Current  Smokeless Tobacco Never   Tobacco Cessation:  A prescription for an FDA-approved tobacco cessation medication provided at discharge  Blood Alcohol level:  Lab Results  Component Value Date   ETH <10 08/08/2023   ETH 297 (H) 06/27/2023    Metabolic Disorder Labs:  Lab Results  Component Value Date   HGBA1C 5.3 08/11/2023   MPG 105.41 08/11/2023   MPG 108.28 03/28/2023   Lab Results  Component Value Date   PROLACTIN 12.0 01/22/2023   PROLACTIN 19.5 11/20/2022   Lab Results  Component Value Date   CHOL 172 08/11/2023   TRIG 146 08/11/2023   HDL 53 08/11/2023   CHOLHDL 3.2 08/11/2023   VLDL 29 08/11/2023   LDLCALC 90 08/11/2023   LDLCALC 84 03/28/2023    See Psychiatric Specialty Exam and Suicide Risk Assessment completed by Attending Physician prior to discharge.  Discharge destination:  Home  Is patient on multiple antipsychotic therapies at discharge:  No   Has Patient had three or more failed trials of antipsychotic monotherapy by history:  No  Recommended Plan for Multiple Antipsychotic Therapies: NA  Discharge Instructions     Increase activity slowly   Complete by: As directed    Increase activity slowly   Complete by: As directed       Allergies as of 08/18/2023       Reactions   Peanut Butter Flavor [flavoring Agent]    Other Itching, Rash, Other (See Comments)   Peanuts   Tylenol [acetaminophen] Rash        Medication List     STOP taking these medications    gabapentin 100 MG capsule Commonly known as: NEURONTIN   hydrOXYzine 50 MG tablet Commonly known as: ATARAX   Tiotropium Bromide-Olodaterol 2.5-2.5 MCG/ACT Aers       TAKE these medications      Indication  albuterol 108 (90 Base) MCG/ACT inhaler Commonly known as: VENTOLIN HFA Inhale 2 puffs into the lungs every 4 (four) hours as needed for  wheezing or shortness of breath.  Indication: Chronic Obstructive Lung Disease   cloNIDine 0.1 MG tablet Commonly known as: CATAPRES Take 1 tablet (0.1 mg total) by mouth 2 (two) times daily.  Indication: High Blood Pressure   docusate sodium 100 MG capsule Commonly known as: COLACE Take 1 capsule (100 mg total) by mouth 2 (two) times daily.  Indication: Constipation   FLUoxetine 20 MG capsule Commonly known as: PROZAC Take 3 capsules (60 mg total) by mouth daily. Start taking on: August 19, 2023 What changed:  medication strength how much to take  Indication: Major Depressive Disorder   levETIRAcetam 750 MG tablet Commonly known as: KEPPRA Take 1 tablet (750 mg total) by mouth 2 (two) times daily. What changed:  when to take this  Indication: Seizure   lisinopril 10 MG tablet Commonly known as: ZESTRIL Take 1 tablet (10 mg total) by mouth daily.  Indication: High Blood Pressure   nicotine polacrilex 2 MG gum Commonly known as: NICORETTE Take 1 each (2 mg total) by mouth as needed for smoking cessation.  Indication: Nicotine Addiction   pantoprazole 20 MG tablet Commonly known as: PROTONIX Take 1 tablet (20 mg total) by mouth daily. Start taking on: August 19, 2023  Indication: Gastroesophageal Reflux Disease   polyethylene glycol 17 g packet Commonly known as: MIRALAX / GLYCOLAX Take 17 g by mouth daily. Start taking on: August 19, 2023  Indication: Constipation   risperiDONE 2 MG tablet Commonly known as: RISPERDAL Take 1 tablet (2 mg total) by mouth at bedtime. What changed: how much to take  Indication: Delusions, Major Depressive Disorder   traZODone 150 MG tablet Commonly known as: DESYREL Take 1 tablet (150 mg total) by mouth at bedtime. What changed:  when to take this reasons to take this  Indication: Trouble Sleeping, Major Depressive Disorder   vitamin D3 25 MCG tablet Commonly known as: CHOLECALCIFEROL Take 1 tablet (1,000 Units total) by  mouth daily. Start taking on: August 19, 2023  Indication: Vitamin D Deficiency        Follow-up Information     [facility] Follow up on 08/24/2023.   Why: You have a hospital follow up appointment for therapy and medication management services on 08/24/23 at 9:00 am . This will be a Virtual telehealth appointment. Contact information: [address]  [address] [place] [address] [phone]                Follow-up recommendations:   The patient is being discharged to home. Patient is to take his discharge medications as ordered.  See follow up above. We recommend that he participates in individual therapy to target uncontrollable agitation and substance abuse.  We recommend that he participates in therapy to target personal conflict, to improve communication skills and conflict resolution skills. Patient is to initiate/implement a contingency based behavioral model to address his behavior. We recommend that he gets AIMS scale, height, weight, blood pressure, fasting lipid panel, fasting blood sugar in three months from discharge if he's on atypical antipsychotics.  Patient will benefit from monitoring of recurrent suicidal ideation since patient is on antidepressant medication. The patient should abstain from all illicit substances and alcohol. If the patient's symptoms worsen or do not continue to improve or if the patient becomes actively suicidal or homicidal then it is recommended that the patient return to the closest hospital emergency room or call 911 for further evaluation and treatment. National Suicide Prevention Lifeline 1800-SUICIDE or 731-740-9137. Please follow up with your primary medical doctor for all other medical needs.  The patient has been educated on the possible side effects to medications and she/her guardian is to contact a medical professional and inform outpatient provider of any new side effects of medication. He is to take regular diet and activity  as tolerated.  Will benefit from moderate daily exercise. Patient and Family was educated about removing/locking any firearms, medications or dangerous products from the home.  Activity:  As tolerated Diet:  Regular Diet  Signed:   Cecilie Lowers, FNP 08/18/2023, 12:53 PM

## 2023-08-30 ENCOUNTER — Emergency Department (HOSPITAL_COMMUNITY)
Admission: EM | Admit: 2023-08-30 | Discharge: 2023-08-30 | Disposition: A | Discharge status: Home / Self Care | Attending: Emergency Medicine | Admitting: Emergency Medicine

## 2023-08-30 ENCOUNTER — Encounter (HOSPITAL_COMMUNITY): Payer: Self-pay | Admitting: Emergency Medicine

## 2023-08-30 ENCOUNTER — Other Ambulatory Visit: Payer: Self-pay

## 2023-08-30 DIAGNOSIS — Z0000 Encounter for general adult medical examination without abnormal findings: Secondary | ICD-10-CM | POA: Insufficient documentation

## 2023-08-30 DIAGNOSIS — Z91010 Allergy to peanuts: Secondary | ICD-10-CM | POA: Insufficient documentation

## 2023-08-30 LAB — CBC WITH DIFFERENTIAL/PLATELET
Abs Immature Granulocytes: 0.01 10*3/uL (ref 0.00–0.07)
Basophils Absolute: 0 10*3/uL (ref 0.0–0.1)
Basophils Relative: 1 %
Eosinophils Absolute: 0 10*3/uL (ref 0.0–0.5)
Eosinophils Relative: 1 %
HCT: 49.9 % (ref 39.0–52.0)
Hemoglobin: 17.3 g/dL — ABNORMAL HIGH (ref 13.0–17.0)
Immature Granulocytes: 0 %
Lymphocytes Relative: 28 %
Lymphs Abs: 1.8 10*3/uL (ref 0.7–4.0)
MCH: 31.5 pg (ref 26.0–34.0)
MCHC: 34.7 g/dL (ref 30.0–36.0)
MCV: 90.9 fL (ref 80.0–100.0)
Monocytes Absolute: 0.7 10*3/uL (ref 0.1–1.0)
Monocytes Relative: 11 %
Neutro Abs: 3.8 10*3/uL (ref 1.7–7.7)
Neutrophils Relative %: 59 %
Platelets: 297 10*3/uL (ref 150–400)
RBC: 5.49 MIL/uL (ref 4.22–5.81)
RDW: 12.6 % (ref 11.5–15.5)
WBC: 6.4 10*3/uL (ref 4.0–10.5)
nRBC: 0 % (ref 0.0–0.2)

## 2023-08-30 LAB — COMPREHENSIVE METABOLIC PANEL WITH GFR
ALT: 30 U/L (ref 0–44)
AST: 25 U/L (ref 15–41)
Albumin: 4.2 g/dL (ref 3.5–5.0)
Alkaline Phosphatase: 63 U/L (ref 38–126)
Anion gap: 14 (ref 5–15)
BUN: 6 mg/dL (ref 6–20)
CO2: 18 mmol/L — ABNORMAL LOW (ref 22–32)
Calcium: 9.4 mg/dL (ref 8.9–10.3)
Chloride: 108 mmol/L (ref 98–111)
Creatinine, Ser: 0.89 mg/dL (ref 0.61–1.24)
GFR, Estimated: >60 mL/min (ref >60)
Glucose, Bld: 92 mg/dL (ref 70–99)
Potassium: 3.4 mmol/L — ABNORMAL LOW (ref 3.5–5.1)
Sodium: 140 mmol/L (ref 135–145)
Total Bilirubin: 0.5 mg/dL (ref 0.0–1.2)
Total Protein: 7.8 g/dL (ref 6.5–8.1)

## 2023-08-30 LAB — ETHANOL: Alcohol, Ethyl (B): 142 mg/dL — ABNORMAL HIGH (ref <10)

## 2023-08-30 NOTE — Discharge Instructions (Signed)
 Please fill up with your family doctor in the office.  As we discussed if you do feel the urge to hurt yourself or hurt other people then please return to the emergency department or go to the behavioral with urgent care center to be evaluated.

## 2023-08-30 NOTE — ED Notes (Signed)
 Pt states there was a misunderstanding with his neighbor. Pt states the neighbor thought he was going to take his self.  Pt states he's not going to hurt himself, he has a baby and on his meds. Pt states he was wiping the knife that was in his hand on his shirt, because he doesn't have nay napkins. He states the neighbor thought he was trying to cut his wrist. Pt keeps saying he has a newborn baby to take care of and live for.

## 2023-08-30 NOTE — ED Provider Notes (Signed)
 [place] EMERGENCY DEPARTMENT AT [facility] Provider Note   CSN: [id number] Arrival date & time: 08/30/23  1517     History  No chief complaint on file.   Raymond [name] is a 43 y.o. male.  43 yo M with no complaints.  He tells me he is here because his neighbor called police and said that he was trying to kill himself.  He was just wiping off a knife in his house after he was using it.  He denies suicidal or homicidal ideation denies hallucinations denies any recent medication changes.        Home Medications Prior to Admission medications   Medication Sig Start Date End Date Taking? Authorizing Provider  albuterol (VENTOLIN HFA) 108 (90 Base) MCG/ACT inhaler Inhale 2 puffs into the lungs every 4 (four) hours as needed for wheezing or shortness of breath. 10/16/22   Clapacs, Jackquline Denmark, MD  cloNIDine (CATAPRES) 0.1 MG tablet Take 1 tablet (0.1 mg total) by mouth 2 (two) times daily. 08/18/23   Ntuen, Jesusita Oka, FNP  docusate sodium (COLACE) 100 MG capsule Take 1 capsule (100 mg total) by mouth 2 (two) times daily. 08/18/23   Ntuen, Jesusita Oka, FNP  FLUoxetine (PROZAC) 20 MG capsule Take 3 capsules (60 mg total) by mouth daily. 08/19/23   Ntuen, Jesusita Oka, FNP  levETIRAcetam (KEPPRA) 750 MG tablet Take 1 tablet (750 mg total) by mouth 2 (two) times daily. 08/18/23   Ntuen, Jesusita Oka, FNP  lisinopril (ZESTRIL) 10 MG tablet Take 1 tablet (10 mg total) by mouth daily. 10/17/22   Clapacs, Jackquline Denmark, MD  nicotine polacrilex (NICORETTE) 2 MG gum Take 1 each (2 mg total) by mouth as needed for smoking cessation. 08/18/23   Ntuen, Jesusita Oka, FNP  pantoprazole (PROTONIX) 20 MG tablet Take 1 tablet (20 mg total) by mouth daily. 08/19/23   Ntuen, Jesusita Oka, FNP  polyethylene glycol (MIRALAX / GLYCOLAX) 17 g packet Take 17 g by mouth daily. 08/19/23   Ntuen, Jesusita Oka, FNP  risperiDONE (RISPERDAL) 2 MG tablet Take 1 tablet (2 mg total) by mouth at bedtime. 08/18/23   Cecilie Lowers, FNP  traZODone (DESYREL) 150 MG tablet  Take 1 tablet (150 mg total) by mouth at bedtime. 08/18/23   Cecilie Lowers, FNP  vitamin D3 (CHOLECALCIFEROL) 25 MCG tablet Take 1 tablet (1,000 Units total) by mouth daily. 08/19/23   Ntuen, Jesusita Oka, FNP      Allergies    Peanut butter flavor [flavoring agent], Other, and Tylenol [acetaminophen]    Review of Systems   Review of Systems  Physical Exam Updated Vital Signs BP (!) 136/98   Pulse 75   Temp 98.1 F (36.7 C) (Oral)   Resp 16   Ht 6\' 1"  (1.854 m)   Wt 77.2 kg   SpO2 95%   BMI 22.45 kg/m  Physical Exam Vitals and nursing note reviewed.  Constitutional:      Appearance: He is well-developed.  HENT:     Head: Normocephalic and atraumatic.  Eyes:     Pupils: Pupils are equal, round, and reactive to light.  Neck:     Vascular: No JVD.  Cardiovascular:     Rate and Rhythm: Normal rate and regular rhythm.     Heart sounds: No murmur heard.    No friction rub. No gallop.  Pulmonary:     Effort: No respiratory distress.     Breath sounds: No wheezing.  Abdominal:  General: There is no distension.     Tenderness: There is no abdominal tenderness. There is no guarding or rebound.  Musculoskeletal:        General: Normal range of motion.     Cervical back: Normal range of motion and neck supple.  Skin:    Coloration: Skin is not pale.     Findings: No rash.  Neurological:     Mental Status: He is alert and oriented to person, place, and time.  Psychiatric:        Behavior: Behavior normal.     ED Results / Procedures / Treatments   Labs (all labs ordered are listed, but only abnormal results are displayed) Labs Reviewed  COMPREHENSIVE METABOLIC PANEL WITH GFR - Abnormal; Notable for the following components:      Result Value   Potassium 3.4 (*)    CO2 18 (*)    All other components within normal limits  CBC WITH DIFFERENTIAL/PLATELET - Abnormal; Notable for the following components:   Hemoglobin 17.3 (*)    All other components within normal limits   ETHANOL  RAPID URINE DRUG SCREEN, HOSP PERFORMED    EKG None  Radiology No results found.  Procedures Procedures    Medications Ordered in ED Medications - No data to display  ED Course/ Medical Decision Making/ A&P                                 Medical Decision Making Amount and/or Complexity of Data Reviewed Labs: ordered.   43 yo M with no complaints.  Said that the police came and got him because there is some concern from the neighbor that he was attempting to slit his wrist.  The patient has no signs of injury.  He is pretty adamant that he was not trying to harm himself.  He was able to contract for safety with me.  Will have him follow-up with behavioral health or his family doctor.  4:33 PM:  I have discussed the diagnosis/risks/treatment options with the patient.  Evaluation and diagnostic testing in the emergency department does not suggest an emergent condition requiring admission or immediate intervention beyond what has been performed at this time.  They will follow up with PCP. We also discussed returning to the ED immediately if new or worsening sx occur. We discussed the sx which are most concerning (e.g., sudden worsening pain, fever, inability to tolerate by mouth) that necessitate immediate return. Medications administered to the patient during their visit and any new prescriptions provided to the patient are listed below.  Medications given during this visit Medications - No data to display   The patient appears reasonably screen and/or stabilized for discharge and I doubt any other medical condition or other [facility] requiring further screening, evaluation, or treatment in the ED at this time prior to discharge.          Final Clinical Impression(s) / ED Diagnoses Final diagnoses:  Encounter for health check    Rx / DC Orders ED Discharge Orders     None         Melene Plan, DO 08/30/23 1633

## 2023-08-30 NOTE — ED Triage Notes (Signed)
 Pt here from not sure where asking to get checked out , pt denis SI/HI  cooperative in triage

## 2023-09-02 ENCOUNTER — Emergency Department
Admission: EM | Admit: 2023-09-02 | Discharge: 2023-09-03 | Disposition: A | Discharge status: Home / Self Care | Source: Home / Self Care | Attending: Emergency Medicine | Admitting: Emergency Medicine

## 2023-09-02 ENCOUNTER — Other Ambulatory Visit: Payer: Self-pay

## 2023-09-02 DIAGNOSIS — F329 Major depressive disorder, single episode, unspecified: Secondary | ICD-10-CM | POA: Insufficient documentation

## 2023-09-02 DIAGNOSIS — Z91010 Allergy to peanuts: Secondary | ICD-10-CM | POA: Insufficient documentation

## 2023-09-02 DIAGNOSIS — Y908 Blood alcohol level of 240 mg/100 ml or more: Secondary | ICD-10-CM | POA: Insufficient documentation

## 2023-09-02 DIAGNOSIS — F10920 Alcohol use, unspecified with intoxication, uncomplicated: Secondary | ICD-10-CM

## 2023-09-02 DIAGNOSIS — F10120 Alcohol abuse with intoxication, uncomplicated: Secondary | ICD-10-CM | POA: Insufficient documentation

## 2023-09-02 DIAGNOSIS — F332 Major depressive disorder, recurrent severe without psychotic features: Secondary | ICD-10-CM | POA: Diagnosis not present

## 2023-09-02 DIAGNOSIS — F32A Depression, unspecified: Secondary | ICD-10-CM

## 2023-09-02 LAB — COMPREHENSIVE METABOLIC PANEL WITH GFR
ALT: 28 U/L (ref 0–44)
AST: 37 U/L (ref 15–41)
Albumin: 4.3 g/dL (ref 3.5–5.0)
Alkaline Phosphatase: 70 U/L (ref 38–126)
Anion gap: 11 (ref 5–15)
BUN: 10 mg/dL (ref 6–20)
CO2: 22 mmol/L (ref 22–32)
Calcium: 8.5 mg/dL — ABNORMAL LOW (ref 8.9–10.3)
Chloride: 104 mmol/L (ref 98–111)
Creatinine, Ser: 0.68 mg/dL (ref 0.61–1.24)
GFR, Estimated: >60 mL/min (ref >60)
Glucose, Bld: 102 mg/dL — ABNORMAL HIGH (ref 70–99)
Potassium: 3.9 mmol/L (ref 3.5–5.1)
Sodium: 137 mmol/L (ref 135–145)
Total Bilirubin: 0.7 mg/dL (ref 0.0–1.2)
Total Protein: 7.6 g/dL (ref 6.5–8.1)

## 2023-09-02 LAB — ACETAMINOPHEN LEVEL: Acetaminophen (Tylenol), Serum: <10 ug/mL — ABNORMAL LOW (ref 10–30)

## 2023-09-02 LAB — CBC WITH DIFFERENTIAL/PLATELET
Abs Immature Granulocytes: 0.02 10*3/uL (ref 0.00–0.07)
Basophils Absolute: 0 10*3/uL (ref 0.0–0.1)
Basophils Relative: 0 %
Eosinophils Absolute: 0 10*3/uL (ref 0.0–0.5)
Eosinophils Relative: 0 %
HCT: 48.2 % (ref 39.0–52.0)
Hemoglobin: 16.8 g/dL (ref 13.0–17.0)
Immature Granulocytes: 0 %
Lymphocytes Relative: 15 %
Lymphs Abs: 1.2 10*3/uL (ref 0.7–4.0)
MCH: 32 pg (ref 26.0–34.0)
MCHC: 34.9 g/dL (ref 30.0–36.0)
MCV: 91.8 fL (ref 80.0–100.0)
Monocytes Absolute: 0.3 10*3/uL (ref 0.1–1.0)
Monocytes Relative: 4 %
Neutro Abs: 6.2 10*3/uL (ref 1.7–7.7)
Neutrophils Relative %: 81 %
Platelets: 288 10*3/uL (ref 150–400)
RBC: 5.25 MIL/uL (ref 4.22–5.81)
RDW: 12.3 % (ref 11.5–15.5)
WBC: 7.7 10*3/uL (ref 4.0–10.5)
nRBC: 0 % (ref 0.0–0.2)

## 2023-09-02 LAB — URINE DRUG SCREEN, QUALITATIVE (ARMC ONLY)
Amphetamines, Ur Screen: NOT DETECTED
Barbiturates, Ur Screen: NOT DETECTED
Benzodiazepine, Ur Scrn: NOT DETECTED
Cannabinoid 50 Ng, Ur ~~LOC~~: POSITIVE — AB
Cocaine Metabolite,Ur ~~LOC~~: NOT DETECTED
MDMA (Ecstasy)Ur Screen: NOT DETECTED
Methadone Scn, Ur: NOT DETECTED
Opiate, Ur Screen: NOT DETECTED
Phencyclidine (PCP) Ur S: NOT DETECTED
Tricyclic, Ur Screen: NOT DETECTED

## 2023-09-02 LAB — SALICYLATE LEVEL: Salicylate Lvl: <7 mg/dL — ABNORMAL LOW (ref 7.0–30.0)

## 2023-09-02 LAB — ETHANOL: Alcohol, Ethyl (B): 283 mg/dL — ABNORMAL HIGH (ref <10)

## 2023-09-02 NOTE — ED Notes (Signed)
 Patient belongings:  2 white shoes 2 black socks 1 pair of brown shorts 1 red shirt 1 blue shirt 1 black sweatshirt 2 white earrings 1 white ring  Bag 1/1

## 2023-09-02 NOTE — ED Notes (Signed)
 Patient dressed out by this RN and Calton Golds, Charity fundraiser.

## 2023-09-02 NOTE — ED Notes (Signed)
 Patient has been informed multiple times to keep both oxygen and pulse ox on finger and continuously removes both. Applied sticky finger pulse ox to keep on. However still continues to remove nasal cannula.

## 2023-09-02 NOTE — ED Notes (Signed)
 Patient in interview room with TTS doing telepsych consult.

## 2023-09-02 NOTE — Consult Note (Incomplete)
 [facility] Psychiatric Consult Initial  Patient Name: .Raymond [name]  MRN: [id number]  DOB: 11-27-1980  Consult Order details:  Orders (From admission, onward)     Start     Ordered   09/02/23 1704  CONSULT TO CALL ACT TEAM       Ordering Provider: Phineas Semen, MD  Provider:  (Not yet assigned)  Question:  Reason for Consult?  Answer:  si   09/02/23 1703   09/02/23 1704  IP CONSULT TO PSYCHIATRY       Ordering Provider: Phineas Semen, MD  Provider:  (Not yet assigned)  Question Answer Comment  Place call to: psychiatry   Reason for Consult Admit   Diagnosis/Clinical Info for Consult: si      09/02/23 1703             Mode of Visit: Tele-visit Virtual Statement:TELE PSYCHIATRY ATTESTATION & CONSENT As the provider for this telehealth consult, I attest that I verified the patient's identity using two separate identifiers, introduced myself to the patient, provided my credentials, disclosed my location, and performed this encounter via a HIPAA-compliant, real-time, face-to-face, two-way, interactive audio and video platform and with the full consent and agreement of the patient (or guardian as applicable.) Patient physical location: ***. Telehealth provider physical location: home office in state of ***.   Video start time: *** Video end time: ***    Psychiatry Consult Evaluation  Service Date: September 02, 2023 LOS:  LOS: 0 days  Chief Complaint ***  Primary Psychiatric Diagnoses  *** 2.  *** 3.  ***  Assessment  Lasean Gorniak is a 43 y.o. male admitted: {CHL BH Medical or Presented to WU:98119}JYN 09/02/2023  3:38 PM for ***. He carries the psychiatric diagnoses of *** and has a past medical history of  ***.   His current presentation of *** is most consistent with ***. He meets criteria for *** based on ***.  Current outpatient psychotropic medications include *** and historically he has had a *** response to these medications. He was *** compliant with medications prior to  admission as evidenced by ***. On initial examination, patient ***. Please see plan below for detailed recommendations.   Diagnoses:  Active Hospital problems: Active Problems:   * No active hospital problems. *    Plan   ## Psychiatric Medication Recommendations:  ***  ## Medical Decision Making Capacity: {CHL BH MEDICAL DECISION MAKING CAPACITY:31818}  ## Further Work-up:  -- *** {CHLmacgeneralandspecificworkuprecs:31821} -- most recent EKG on *** had QtC of *** -- Pertinent labwork reviewed earlier this admission includes: ***   ## Disposition:-- {CHLmaccldispo:31820}  ## Behavioral / Environmental: -{CHLmacbehavioralenvironmental2:31847}    ## Safety and Observation Level:  - Based on my clinical evaluation, I estimate the patient to be at *** risk of self harm in the current setting. - At this time, we recommend  {CHL BH SUICIDE OBSERVATION LEVEL:31850}. This decision is based on my review of the chart including patient's history and current presentation, interview of the patient, mental status examination, and consideration of suicide risk including evaluating suicidal ideation, plan, intent, suicidal or self-harm behaviors, risk factors, and protective factors. This judgment is based on our ability to directly address suicide risk, implement suicide prevention strategies, and develop a safety plan while the patient is in the clinical setting. Please contact our team if there is a concern that risk level has changed.  CSSR Risk Category:C-SSRS RISK CATEGORY: High Risk  Suicide Risk Assessment: Patient has following modifiable risk factors for  suicide: {CHLmacmodifiablesuicideriskfactors:31822}, which we are addressing by ***. Patient has following non-modifiable or demographic risk factors for suicide: {CHLmacnonmodifiablesuicideriskfactors:31823} Patient has the following protective factors against suicide: {CHLmacprotectivefactors:31824}  Thank you for this consult  request. Recommendations have been communicated to the primary team.  We will *** at this time.   De Burrs, NP       History of Present Illness  Relevant Aspects of Hospital [facility] or ED course:31819} Course:  Admitted on 09/02/2023 for ***. They ***.   Patient Report:  ***  Psych ROS:  Depression: *** Anxiety:  *** Mania (lifetime and current): *** Psychosis: (lifetime and current): ***  Collateral information:  Contacted *** at *** on ***  ROS   Psychiatric and Social History  Psychiatric History:  Information collected from ***  Prev Dx/Sx: *** Current Psych Provider: *** Home Meds (current): *** Previous Med Trials: *** Therapy: ***  Prior Psych Hospitalization: ***  Prior Self Harm: *** Prior Violence: ***  Family Psych History: *** Family Hx suicide: ***  Social History:  Developmental Hx: *** Educational Hx: *** Occupational Hx: *** Legal Hx: *** Living Situation: *** Spiritual Hx: *** Access to weapons/lethal means: ***   Substance History Alcohol: ***  Type of alcohol *** Last Drink *** Number of drinks per day *** History of alcohol withdrawal seizures *** History of DT's *** Tobacco: *** Illicit drugs: *** Prescription drug abuse: *** Rehab hx: ***  Exam Findings  Physical Exam: *** Vital Signs:  Temp:  [97.7 F (36.5 C)-97.9 F (36.6 C)] 97.9 F (36.6 C) (04/02 1939) Pulse Rate:  [68-81] 81 (04/02 1939) Resp:  [16-18] 18 (04/02 1939) BP: (118-138)/(80-98) 118/98 (04/02 1939) SpO2:  [88 %-95 %] 93 % (04/02 1939) Weight:  [77.2 kg] 77.2 kg (04/02 1543) Blood pressure (!) 118/98, pulse 81, temperature 97.9 F (36.6 C), temperature source Oral, resp. rate 18, height 6\' 1"  (1.854 m), weight 77.2 kg, SpO2 93%. Body mass index is 22.45 kg/m.  Physical Exam  Mental Status Exam: General Appearance: {Appearance:22683}  Orientation:  {BHH ORIENTATION (PAA):22689}  Memory:  {BHH MEMORY:22881}  Concentration:   {Concentration:21399}  Recall:  {BHH GOOD/FAIR/POOR:22877}  Attention  {BH Attention Span:31825}  Eye Contact:  {BHH EYE CONTACT:22684}  Speech:  {Speech:22685}  Language:  {BHH GOOD/FAIR/POOR:22877}  Volume:  {Volume (PAA):22686}  Mood: ***  Affect:  {Affect (PAA):22687}  Thought Process:  {Thought Process (PAA):22688}  Thought Content:  {Thought Content:22690}  Suicidal Thoughts:  {ST/HT (PAA):22692}  Homicidal Thoughts:  {ST/HT (PAA):22692}  Judgement:  {Judgement (PAA):22694}  Insight:  {Insight (PAA):22695}  Psychomotor Activity:  {Psychomotor (PAA):22696}  Akathisia:  {BHH YES OR NO:22294}  Fund of Knowledge:  {BHH GOOD/FAIR/POOR:22877}      Assets:  {Assets (PAA):22698}  Cognition:  {chl bhh cognition:304700322}  ADL's:  {BHH ZOX'W:96045}  AIMS (if indicated):        Other History   These have been pulled in through the EMR, reviewed, and updated if appropriate.  Family History:  The patient's family history is not on file.  Medical History: Past Medical History:  Diagnosis Date  . Alcohol abuse   . Anxiety   . Hepatitis C   . PTSD (post-traumatic stress disorder)     Surgical History: Past Surgical History:  Procedure Laterality Date  . FACIAL FRACTURE SURGERY     metal plate under right eye     Medications:  No current facility-administered medications for this encounter.  Current Outpatient Medications:  .  albuterol (VENTOLIN HFA)  108 (90 Base) MCG/ACT inhaler, Inhale 2 puffs into the lungs every 4 (four) hours as needed for wheezing or shortness of breath., Disp: 6.7 g, Rfl: 0 .  cloNIDine (CATAPRES) 0.1 MG tablet, Take 1 tablet (0.1 mg total) by mouth 2 (two) times daily., Disp: 60 tablet, Rfl: 11 .  docusate sodium (COLACE) 100 MG capsule, Take 1 capsule (100 mg total) by mouth 2 (two) times daily., Disp: 10 capsule, Rfl: 0 .  FLUoxetine (PROZAC) 20 MG capsule, Take 3 capsules (60 mg total) by mouth daily., Disp: 90 capsule, Rfl: 3 .   levETIRAcetam (KEPPRA) 750 MG tablet, Take 1 tablet (750 mg total) by mouth 2 (two) times daily., Disp: 60 tablet, Rfl: 0 .  lisinopril (ZESTRIL) 10 MG tablet, Take 1 tablet (10 mg total) by mouth daily., Disp: 10 tablet, Rfl: 0 .  nicotine polacrilex (NICORETTE) 2 MG gum, Take 1 each (2 mg total) by mouth as needed for smoking cessation., Disp: 100 tablet, Rfl: 0 .  pantoprazole (PROTONIX) 20 MG tablet, Take 1 tablet (20 mg total) by mouth daily., Disp: 30 tablet, Rfl: 0 .  polyethylene glycol (MIRALAX / GLYCOLAX) 17 g packet, Take 17 g by mouth daily., Disp: 30 packet, Rfl: 0 .  risperiDONE (RISPERDAL) 2 MG tablet, Take 1 tablet (2 mg total) by mouth at bedtime., Disp: 30 tablet, Rfl: 0 .  traZODone (DESYREL) 150 MG tablet, Take 1 tablet (150 mg total) by mouth at bedtime., Disp: 30 tablet, Rfl: 0 .  vitamin D3 (CHOLECALCIFEROL) 25 MCG tablet, Take 1 tablet (1,000 Units total) by mouth daily., Disp: 60 tablet, Rfl: 0  Allergies: Allergies  Allergen Reactions  . Peanut Butter Flavor [Flavoring Agent]   . Other Itching, Rash and Other (See Comments)    Peanuts  . Tylenol [Acetaminophen] Rash    De Burrs, NP

## 2023-09-02 NOTE — ED Triage Notes (Signed)
 Patient was brought in by EMS after found walking in by [employer] on the side of the road. Patient states he's been drinking to "celebrate his birthday" but unknown as to how much. Patient also 2 days ago stated he took 70 pills of his normal medication but it is unknown which medication due to his depression and missing his mom and also said he attempted to cut his wrists and neck.  EMS:  BP 142/96 HR 78 O2 95% RA CBG 120  Given 4mg  of zofran

## 2023-09-02 NOTE — ED Provider Notes (Signed)
 [facility] Provider Note    Event Date/Time   First MD Initiated Contact with Patient 09/02/23 1542     (approximate)   History   Alcohol Intoxication and SI   HPI  Raymond [name] is a 43 y.o. male  who presents to the emergency department today because of concern for suicidal ideation. EMS was called by police department and patient was on the side of the street. Told EMS that he took roughly 70 pills two days ago in an attempt to kill himself. Per chart review he was seen at outside hospital 3 days ago after neighbor had concern for patient's safety. Patient himself appears intoxicated and cannot give good history at this time.     Physical Exam   Triage Vital Signs: ED Triage Vitals  Encounter Vitals Group     BP --      Systolic BP Percentile --      Diastolic BP Percentile --      Pulse --      Resp --      Temp --      Temp src --      SpO2 09/02/23 1540 95 %     Weight --      Height --      Head Circumference --      Peak Flow --      Pain Score 09/02/23 1542 0     Pain Loc --      Pain Education --      Exclude from Growth Chart --     Most recent vital signs: Vitals:   09/02/23 1540  SpO2: 95%   General: Appears intoxicated CV:  Good peripheral perfusion.  Resp:  Normal effort.  Abd:  No distention.    ED Results / Procedures / Treatments   Labs (all labs ordered are listed, but only abnormal results are displayed) Labs Reviewed  COMPREHENSIVE METABOLIC PANEL WITH GFR - Abnormal; Notable for the following components:      Result Value   Glucose, Bld 102 (*)    Calcium 8.5 (*)    All other components within normal limits  URINE DRUG SCREEN, QUALITATIVE ([facility] ONLY) - Abnormal; Notable for the following components:   Cannabinoid 50 Ng, Ur [place] POSITIVE (*)    All other components within normal limits  ETHANOL - Abnormal; Notable for the following components:   Alcohol, Ethyl (B) 283 (*)    All other components within  normal limits  ACETAMINOPHEN LEVEL - Abnormal; Notable for the following components:   Acetaminophen (Tylenol), Serum <10 (*)    All other components within normal limits  SALICYLATE LEVEL - Abnormal; Notable for the following components:   Salicylate Lvl <7.0 (*)    All other components within normal limits  CBC WITH DIFFERENTIAL/PLATELET     EKG  I, Phineas Semen, attending physician, personally viewed and interpreted this EKG  EKG Time: 1544 Rate: 70 Rhythm: normal sinus rhythm Axis: normal Intervals: qtc 432 QRS: narrow ST changes: no st elevation Impression: normal ekg   RADIOLOGY None   PROCEDURES:  Critical Care performed: No   MEDICATIONS ORDERED IN ED: Medications - No data to display   IMPRESSION / MDM / ASSESSMENT AND PLAN / ED COURSE  I reviewed the triage vital signs and the nursing notes.  Differential diagnosis includes, but is not limited to, drug induced mood disorder, psychiatric illness, depression.  Patient's presentation is most consistent with acute presentation with potential threat to life or bodily function.   Patient presented to the emergency department today via EMS because of concerns for intoxication and SI.  Time of initial exam patient appeared quite intoxicated.  However he did sober up while here in the emergency department.  Per chart review he was seen at outside ED a few days ago with similar safety concerns.  This time will have psychiatry evaluate.  The patient has been placed in psychiatric observation due to the need to provide a safe environment for the patient while obtaining psychiatric consultation and evaluation, as well as ongoing medical and medication management to treat the patient's condition.  The patient has not been placed under full IVC at this time.      FINAL CLINICAL IMPRESSION(S) / ED DIAGNOSES   Final diagnoses:  Alcoholic intoxication without complication (HCC)   Depression, unspecified depression type     Note:  This document was prepared using Dragon voice recognition software and may include unintentional dictation errors.    Phineas Semen, MD 09/02/23 [phone]

## 2023-09-02 NOTE — ED Notes (Signed)
 Patient talking to self "saying I want to go home, I have a 79 week old baby at home" along with cussing loud enough all over A side of ED. Notified MD of patient requests and statements.

## 2023-09-02 NOTE — ED Notes (Signed)
 This tech obtained vital signs on pt.

## 2023-09-02 NOTE — ED Notes (Signed)
 Hospital meal provided, pt tolerated w/o complaints.  Waste discarded appropriately.

## 2023-09-02 NOTE — ED Notes (Signed)
 VOL/  PENDING  CONSULT

## 2023-09-03 ENCOUNTER — Inpatient Hospital Stay
Admission: AD | Admit: 2023-09-03 | Discharge: 2023-09-10 | DRG: 885 | Disposition: A | Discharge status: Home / Self Care | Source: Intra-hospital | Attending: Psychiatry | Admitting: Psychiatry

## 2023-09-03 ENCOUNTER — Encounter: Payer: Self-pay | Admitting: Psychiatry

## 2023-09-03 DIAGNOSIS — F17210 Nicotine dependence, cigarettes, uncomplicated: Secondary | ICD-10-CM | POA: Diagnosis present

## 2023-09-03 DIAGNOSIS — Z5982 Transportation insecurity: Secondary | ICD-10-CM

## 2023-09-03 DIAGNOSIS — Z886 Allergy status to analgesic agent status: Secondary | ICD-10-CM

## 2023-09-03 DIAGNOSIS — F1010 Alcohol abuse, uncomplicated: Secondary | ICD-10-CM | POA: Diagnosis not present

## 2023-09-03 DIAGNOSIS — F333 Major depressive disorder, recurrent, severe with psychotic symptoms: Secondary | ICD-10-CM | POA: Diagnosis not present

## 2023-09-03 DIAGNOSIS — Z91018 Allergy to other foods: Secondary | ICD-10-CM

## 2023-09-03 DIAGNOSIS — F329 Major depressive disorder, single episode, unspecified: Secondary | ICD-10-CM | POA: Diagnosis present

## 2023-09-03 DIAGNOSIS — F332 Major depressive disorder, recurrent severe without psychotic features: Principal | ICD-10-CM | POA: Diagnosis present

## 2023-09-03 DIAGNOSIS — F1910 Other psychoactive substance abuse, uncomplicated: Secondary | ICD-10-CM | POA: Insufficient documentation

## 2023-09-03 DIAGNOSIS — F4310 Post-traumatic stress disorder, unspecified: Secondary | ICD-10-CM | POA: Diagnosis present

## 2023-09-03 DIAGNOSIS — Z79899 Other long term (current) drug therapy: Secondary | ICD-10-CM

## 2023-09-03 DIAGNOSIS — Z87820 Personal history of traumatic brain injury: Secondary | ICD-10-CM | POA: Diagnosis not present

## 2023-09-03 DIAGNOSIS — Z9151 Personal history of suicidal behavior: Secondary | ICD-10-CM

## 2023-09-03 DIAGNOSIS — F411 Generalized anxiety disorder: Secondary | ICD-10-CM | POA: Diagnosis present

## 2023-09-03 DIAGNOSIS — F10120 Alcohol abuse with intoxication, uncomplicated: Secondary | ICD-10-CM | POA: Diagnosis present

## 2023-09-03 DIAGNOSIS — F32A Depression, unspecified: Secondary | ICD-10-CM | POA: Diagnosis not present

## 2023-09-03 DIAGNOSIS — G4700 Insomnia, unspecified: Secondary | ICD-10-CM | POA: Diagnosis present

## 2023-09-03 DIAGNOSIS — C3490 Malignant neoplasm of unspecified part of unspecified bronchus or lung: Secondary | ICD-10-CM | POA: Diagnosis present

## 2023-09-03 DIAGNOSIS — R569 Unspecified convulsions: Secondary | ICD-10-CM | POA: Diagnosis not present

## 2023-09-03 DIAGNOSIS — Y908 Blood alcohol level of 240 mg/100 ml or more: Secondary | ICD-10-CM | POA: Diagnosis present

## 2023-09-03 DIAGNOSIS — F1510 Other stimulant abuse, uncomplicated: Secondary | ICD-10-CM | POA: Diagnosis present

## 2023-09-03 DIAGNOSIS — F1990 Other psychoactive substance use, unspecified, uncomplicated: Secondary | ICD-10-CM | POA: Diagnosis not present

## 2023-09-03 MED ORDER — OLANZAPINE 10 MG IM SOLR
5.0000 mg | Freq: Three times a day (TID) | INTRAMUSCULAR | Status: DC | PRN
Start: 2023-09-03 — End: 2023-09-10

## 2023-09-03 MED ORDER — OLANZAPINE 10 MG IM SOLR: 10.0000 mg | Freq: Three times a day (TID) | INTRAMUSCULAR | Status: DC | PRN

## 2023-09-03 MED ORDER — CLONIDINE HCL 0.1 MG PO TABS
0.1000 mg | ORAL_TABLET | Freq: Once | ORAL | Status: AC
  Administered 2023-09-03: 0.1 mg via ORAL
  Filled 2023-09-03: qty 1

## 2023-09-03 MED ORDER — NICOTINE POLACRILEX 2 MG MT GUM
2.0000 mg | CHEWING_GUM | OROMUCOSAL | Status: DC | PRN
  Administered 2023-09-04 – 2023-09-10 (×3): 2 mg via ORAL
  Filled 2023-09-03 (×3): qty 1

## 2023-09-03 MED ORDER — LEVETIRACETAM 500 MG PO TABS
1000.0000 mg | ORAL_TABLET | Freq: Two times a day (BID) | ORAL | Status: DC
Start: 2023-09-03 — End: 2023-09-09
  Administered 2023-09-03 – 2023-09-09 (×13): 1000 mg via ORAL
  Filled 2023-09-03 (×13): qty 2

## 2023-09-03 MED ORDER — OLANZAPINE 5 MG PO TBDP
5.0000 mg | ORAL_TABLET | Freq: Three times a day (TID) | ORAL | Status: DC | PRN
  Administered 2023-09-03 – 2023-09-05 (×2): 5 mg via ORAL
  Filled 2023-09-03 (×2): qty 1

## 2023-09-03 MED ORDER — ALUM & MAG HYDROXIDE-SIMETH 200-200-20 MG/5ML PO SUSP: 30.0000 mL | ORAL | Status: DC | PRN

## 2023-09-03 NOTE — BHH Suicide Risk Assessment (Signed)
 B[facility] INPATIENT:  Family/Significant Other Suicide Prevention Education  Suicide Prevention Education:  Contact Attempts: Sava Proby, [phone], Brother, has been identified by the patient as the family member/significant other with whom the patient will be residing, and identified as the person(s) who will aid the patient in the event of a mental health crisis.  With written consent from the patient, two attempts were made to provide suicide prevention education, prior to and/or following the patient's discharge.  We were unsuccessful in providing suicide prevention education.  A suicide education pamphlet was given to the patient to share with family/significant other.  Date and time of first attempt:09/03/23 at 3:40 PM.   Date and time of second attempt: Second attempt is needed.   Lowry Ram 09/03/2023, 3:44 PM
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
## 2023-09-03 NOTE — Plan of Care (Signed)
   Problem: Activity: Goal: Interest or engagement in activities will improve Outcome: Progressing   Problem: Health Behavior/Discharge Planning: Goal: Compliance with treatment plan for underlying cause of condition will improve Outcome: Progressing   Problem: Safety: Goal: Periods of time without injury will increase Outcome: Progressing

## 2023-09-03 NOTE — Group Note (Signed)
 LCSW Group Therapy Note  Group Date: 09/03/2023 Start Time: 1300 End Time: 1350   Type of Therapy and Topic:  Group Therapy: Anger Cues and Responses  Participation Level:  None   Description of Group:   In this group, patients learned how to recognize the physical, cognitive, emotional, and behavioral responses they have to anger-provoking situations.  They identified a recent time they became angry and how they reacted.  They analyzed how their reaction was possibly beneficial and how it was possibly unhelpful.  The group discussed a variety of healthier coping skills that could help with such a situation in the future.  Focus was placed on how helpful it is to recognize the underlying emotions to our anger, because working on those can lead to a more permanent solution as well as our ability to focus on the important rather than the urgent.  Therapeutic Goals: Patients will remember their last incident of anger and how they felt emotionally and physically, what their thoughts were at the time, and how they behaved. Patients will identify how their behavior at that time worked for them, as well as how it worked against them. Patients will explore possible new behaviors to use in future anger situations. Patients will learn that anger itself is normal and cannot be eliminated, and that healthier reactions can assist with resolving conflict rather than worsening situations.  Summary of Patient Progress: Patient was present in group, though did not particpate in group discussion.  Patient appeared attentive and engaged.  Therapeutic Modalities:   Cognitive Behavioral Therapy    Harden Mo, LCSW 09/03/2023  2:02 PM

## 2023-09-03 NOTE — Group Note (Signed)
 Recreation Therapy Group Note   Group Topic:General Recreation  Group Date: 09/03/2023 Start Time: 1000 End Time: 1100 Facilitators: Rosina Lowenstein, LRT, CTRS Location: Courtyard  Group Description: [employer]. LRT and patients played games of basketball, drew with chalk, and played corn hole while outside in the courtyard while getting fresh air and sunlight. Music was being played in the background. LRT and peers conversed about different games they have played before, what they do in their free time and anything else that is on their minds. LRT encouraged pts to drink water after being outside, sweating and getting their heart rate up.  Goal Area(s) Addressed: Patient will build on frustration tolerance skills. Patients will partake in a competitive play game with peers. Patients will gain knowledge of new leisure interest/hobby.    Affect/Mood: N/A   Participation Level: Did not attend    Clinical Observations/Individualized Feedback: Patient was not on the unit at the time of group.   Plan: Continue to engage patient in RT group sessions 2-3x/week.   Rosina Lowenstein, LRT, CTRS 09/03/2023 1:32 PM

## 2023-09-03 NOTE — ED Provider Notes (Signed)
 Emergency Medicine Observation Re-evaluation Note  Raymond [name] is a 43 y.o. male, seen on rounds today.  Pt initially presented to the ED for complaints of Alcohol Intoxication and SI Currently, the patient is resting comfortably.  Physical Exam  BP (!) 118/98 (BP Location: Left Arm)   Pulse 81   Temp 97.9 F (36.6 C) (Oral)   Resp 18   Ht 6\' 1"  (1.854 m)   Wt 77.2 kg   SpO2 93%   BMI 22.45 kg/m  Physical Exam General: No acute distress  ED Course / MDM  EKG:   I have reviewed the labs performed to date as well as medications administered while in observation.  Recent changes in the last 24 hours include no acute events.  Plan  Current plan is for psychiatric assessment.    Janith Lima, MD 09/03/23 [phone]

## 2023-09-03 NOTE — Group Note (Signed)
 Date:  09/03/2023 Time:  10:52 PM  Group Topic/Focus:  Relapse Prevention Planning:   The focus of this group is to define relapse and discuss the need for planning to combat relapse.    Participation Level:  Active  Participation Quality:  Appropriate  Affect:  Appropriate  Cognitive:  Appropriate  Insight: Appropriate  Engagement in Group:  Engaged and Supportive  Modes of Intervention:  Discussion, Education, Socialization, and Support  Additional Comments:  Patient presented within normal limits, was an active participant in group, and was a support to peers. Patient was open about his recovery journey and was able to identify emotional triggers which caused self medicating tendencies.   Lorenda Ishihara 09/03/2023, 10:52 PM

## 2023-09-03 NOTE — Progress Notes (Addendum)
 Patient admitted voluntarily to [facility] from [facility] ED due to overdosing on 70 pills. Patient presents in animated mood and stating he just had a "moment" where he was thinking about his mom which made him depressed and no longer wanting to be here. Patient currently denies SI,HI, and auditory hallucinations but endorses seeing shadows at times. Patient states recently being at [facility] but states he has not taken his medications in about a week and a half due to running out and not refilling. Patient states he is on seizure medication and his last seizure was a week ago. Patient also presented with elevated bp and BAL of 283. Provider notified, clonidine po given. Patient provided with meal and denies any s/s of current distress. Patient is cooperative in milieu and socializing appropriately.

## 2023-09-03 NOTE — Progress Notes (Signed)
 Patient engaging in on activities in dayroom. Remains cooperative in unit at this time.

## 2023-09-03 NOTE — Consult Note (Signed)
Admission orders completed.

## 2023-09-03 NOTE — Consult Note (Signed)
**Note Raymond-Identified via Obfuscation**  [facility] Psychiatric Consult Initial  Raymond [name] Name: .Raymond Raymond [name]  MRN: [id number]  DOB: 12-03-1980  Consult Order details:  Orders (From admission, onward)     Start     Ordered   09/02/23 1704  CONSULT TO CALL ACT TEAM       Ordering Provider: Phineas Semen, MD  Provider:  (Not yet assigned)  Question:  Reason for Consult?  Answer:  si   09/02/23 1703   09/02/23 1704  IP CONSULT TO PSYCHIATRY       Ordering Provider: Phineas Semen, MD  Provider:  (Not yet assigned)  Question Answer Comment  Place call to: psychiatry   Reason for Consult Admit   Diagnosis/Clinical Info for Consult: si      09/02/23 1703             Mode of Visit: Tele-visit Virtual Statement:TELE PSYCHIATRY ATTESTATION & CONSENT As Raymond provider for this telehealth consult, I attest that I verified Raymond Raymond [name]'s identity using two separate identifiers, introduced myself to Raymond Raymond [name], provided my credentials, disclosed my location, and performed this encounter via a HIPAA-compliant, real-time, face-to-face, two-way, interactive audio and video platform and with Raymond full consent and agreement of Raymond Raymond [name] (or guardian as applicable.) Raymond [name] physical location: [facility]. Telehealth provider physical location: home office in state of [place].   Video start time:   Video end time:      Psychiatry Consult Evaluation  Service Date: September 03, 2023 LOS:  LOS: 0 days  Chief Complaint MDD with SI  Primary Psychiatric Diagnoses  MDD  Assessment  Raymond Raymond [name] is a 43 y.o. male admitted: Presented to Raymond EDfor 09/02/2023  3:38 PM Raymond [name] was brought in by EMS stating that Raymond Raymond [name] said he took 70 pills in Raymond past 2 days. Raymond Raymond [name] presents with significant emotional distress, including restlessness, hopelessness, and evidence of poor coping related to grief and loss. Despite his denial of current suicidal ideation, Raymond context of past overdose, current psychosocial stressors, and  depressive symptoms (including anhedonia, social isolation, and existential despair) are concerning.  His current presentation of anhedonia, social isolation, and existential despair is most consistent with Major Depressive Episode . He meets criteria for inpatient admission based on DSM-5 diagnostic criteria. Please see plan below for detailed recommendations.   Diagnoses:  Active Hospital problems: Active Problems:   * No active hospital problems. *    Plan   ## Psychiatric Medication Recommendations:  10mg  Zyprexa at bedtime and 20mg  Prozac at bedtime.    ## Medical Decision Making Capacity: Not specifically addressed in this encounter   ## Disposition:-- We recommend inpatient psychiatric hospitalization when medically cleared. Raymond [name] is under voluntary admission status at this time; please IVC if attempts to leave hospital.  ## Behavioral / Environmental: - No specific recommendations at this time.     ## Safety and Observation Level:  - Based on my clinical evaluation, I estimate Raymond Raymond [name] to be at low risk of self harm in Raymond current setting. - At this time, we recommend  routine. This decision is based on my review of Raymond chart including Raymond [name]'s history and current presentation, interview of Raymond Raymond [name], mental status examination, and consideration of suicide risk including evaluating suicidal ideation, plan, intent, suicidal or self-harm behaviors, risk factors, and protective factors. This judgment is based on our ability to directly address suicide risk, implement suicide prevention strategies, and develop a safety plan while Raymond Raymond [name] is in Raymond clinical setting. Please contact our  team if there is a concern that risk level has changed.  CSSR Risk Category:C-SSRS RISK CATEGORY: High Risk  Suicide Risk Assessment: Raymond [name] has following modifiable risk factors for suicide: untreated depression, which we are addressing by inpatient admission. Raymond [name] has following  non-modifiable or demographic risk factors for suicide: male gender Raymond [name] has Raymond following protective factors against suicide: Supportive friends  Thank you for this consult request. Recommendations have been communicated to Raymond primary team.  We will recommend inpatient at this time.   Raymond Burrs, NP       History of Present Illness  Relevant Aspects of Hospital ED Course:  Admitted on 09/02/2023 for MDD.  Raymond [name] Report:  Raymond [name] was brought to Raymond ED by EMS following a call from Raymond local police department ([employer]). Police reported that Raymond Raymond [name] told EMS he took approximately 70 pills two days ago. Raymond Raymond [name] denies making this statement and denies any recent overdose. However, he reports a past incident where he ingested approximately 90 pills over two days, including all of his prescribed medications, due to Raymond anniversary of his mother's death.  Raymond [name] denies current suicidal ideation (SI), stating, "I have too many people that love me." However, he also states, "I have no one to love me. I have no wife, no mother, and no children," and reports that he lost two children. 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  He admits to drinking a beer earlier in Raymond day but denies frequent alcohol use. Urine drug screen (UDS) was positive for marijuana; Raymond [name] reports minimal use, stating he only smoked a small amount of a blunt. He adds that he cannot smoke much due to his stage 2 lung cancer.  Psych ROS:  Depression: yes Anxiety:  yes Mania (lifetime and current): no Psychosis: (lifetime and current): no   Review of Systems  Constitutional: Negative.   HENT: Negative.    Eyes: Negative.   Respiratory: Negative.    Cardiovascular: Negative.   Gastrointestinal: Negative.   Genitourinary: Negative.   Musculoskeletal: Negative.   Skin: Negative.   Neurological: Negative.    Psychiatric/Behavioral:  Positive for depression. Raymond Raymond [name] is nervous/anxious.      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 Hx: unknown Living Situation: lives alone in apartment Spiritual Hx: unknown Access to weapons/lethal means: no   Substance History Alcohol: yes  Type of alcohol beer Last Drink today Number of drinks per day -social History of alcohol withdrawal seizures 0 History of DT's 0 Tobacco: 0 Illicit drugs: 0 Prescription drug abuse: 0 Rehab hx: unknown  Exam Findings   Vital Signs:  Temp:  [97.7 F (36.5 C)-97.9 F (36.6 C)] 97.9 F (36.6 C) (04/02 1939) Pulse Rate:  [68-81] 73 (04/03 0320) Resp:  [16-18] 16 (04/03 0320) BP: (118-138)/(80-98) 132/85 (04/03 0320) SpO2:  [88 %-97 %] 97 % (04/03 0320) Weight:  [77.2 kg] 77.2 kg (04/02 1543) Blood pressure 132/85, pulse 73, temperature 97.9 F (36.6 C), temperature source Oral, resp. rate 16, height 6\' 1"  (1.854  m), weight 77.2 kg, SpO2 97%. Body mass index is 22.45 kg/m.  Physical Exam HENT:     Head: Normocephalic.     Nose: Nose normal.     Mouth/Throat:     Pharynx: Oropharynx is clear.  Pulmonary:     Effort: Pulmonary effort is normal.  Musculoskeletal:        General: Normal range of motion.     Cervical back: Normal range of motion.  Skin:    General: Skin is dry.  Neurological:     Mental Status: He is alert.        Other History   These have been pulled in through Raymond EMR, reviewed, and updated if appropriate.  Family History:  Raymond  Raymond [name]'s family history is not on file.  Medical History: Past Medical History:  Diagnosis Date   Alcohol abuse    Anxiety    Hepatitis C    PTSD (post-traumatic stress disorder)     Surgical History: Past Surgical History:  Procedure Laterality Date   FACIAL FRACTURE SURGERY     metal plate under right eye     Medications:  No current facility-administered medications for this encounter.  Current Outpatient Medications:    albuterol (VENTOLIN HFA) 108 (90 Base) MCG/ACT inhaler, Inhale 2 puffs into Raymond lungs every 4 (four) hours as needed for wheezing or shortness of breath., Disp: 6.7 g, Rfl: 0   cloNIDine (CATAPRES) 0.1 MG tablet, Take 1 tablet (0.1 mg total) by mouth 2 (two) times daily., Disp: 60 tablet, Rfl: 11   docusate sodium (COLACE) 100 MG capsule, Take 1 capsule (100 mg total) by mouth 2 (two) times daily., Disp: 10 capsule, Rfl: 0   FLUoxetine (PROZAC) 20 MG capsule, Take 3 capsules (60 mg total) by mouth daily., Disp: 90 capsule, Rfl: 3   levETIRAcetam (KEPPRA) 750 MG tablet, Take 1 tablet (750 mg total) by mouth 2 (two) times daily., Disp: 60 tablet, Rfl: 0   lisinopril (ZESTRIL) 10 MG tablet, Take 1 tablet (10 mg total) by mouth daily., Disp: 10 tablet, Rfl: 0   nicotine polacrilex (NICORETTE) 2 MG gum, Take 1 each (2 mg total) by mouth as needed for smoking cessation., Disp: 100 tablet, Rfl: 0   pantoprazole (PROTONIX) 20 MG tablet, Take 1 tablet (20 mg total) by mouth daily., Disp: 30 tablet, Rfl: 0   polyethylene glycol (MIRALAX / GLYCOLAX) 17 g packet, Take 17 g by mouth daily., Disp: 30 packet, Rfl: 0   risperiDONE (RISPERDAL) 2 MG tablet, Take 1 tablet (2 mg total) by mouth at bedtime., Disp: 30 tablet, Rfl: 0   traZODone (DESYREL) 150 MG tablet, Take 1 tablet (150 mg total) by mouth at bedtime., Disp: 30 tablet, Rfl: 0   vitamin D3 (CHOLECALCIFEROL) 25 MCG tablet, Take 1 tablet (1,000 Units total) by mouth daily., Disp: 60 tablet, Rfl:  0  Allergies: Allergies  Allergen Reactions   Peanut Butter Flavor [Flavoring Agent]    Other Itching, Rash and Other (See Comments)    Peanuts   Tylenol [Acetaminophen] Rash    Raymond Burrs, NP

## 2023-09-03 NOTE — ED Notes (Signed)
 Per [name], patient has pending acceptance to [facility].  So [facility] assisted with having pt to sign voluntary consent form.  Nursing secretary has scanned form in chart.  Pt is very happy and feels positive about BM[facility] inpatient treatment plan.    Raymond [name], [facility]

## 2023-09-03 NOTE — BH Assessment (Signed)
 Comprehensive Clinical Assessment (CCA) Note  09/03/2023 Raymond [name] [id number] Recommendations for Services/Supports/Treatments: Psych NP Marzella Schlein determined pt. meets psychiatric inpatient criteria. Raymond [name] is a 43 year old, English speaking, Caucasian male with a hx of MDD, PTSD, Panic Attacks, Polysubstance abuse, TBI, and seizures. Pt presented to [facility] for a psych eval. Per triage note: Patient was brought in by EMS after found walking in by [employer] on the side of the road. Patient states he's been drinking to "celebrate his birthday" but unknown as to how much. Patient also 2 days ago stated he took 70 pills of his normal medication but it is unknown which medication due to his depression and missing his mom and also said he attempted to cut his wrists and neck.  When asked why he'd presented to the ED pt. stated, "I'm doing good. I was on my way home. I don't know why I'm here." The patient admitted to having gaps in his memory, explaining that he does not recall making any suicidal statements and does not understand why EMS was called. Pt reported that he struggles with grief emotions about losing his mother and having back to back miscarriages, leaving him alone in the world. Pt explained that he struggles during holidays and birthday anniversaries; however, pt adamantly denied having suicidal thoughts or intent. Pt was fixated on wanting to be discharged. Pt reported that he does not have any psych medications left, explaining that he is slated to get prescription refills tomorrow. Pt was a poor historian. Pt initially denied cannabis use; however, pt. admitted to using cannabis and drinking a single beer with a friend prior to arrival upon learning that his UDS was positive for cannabis. Pt had impaired judgement and insight. Pt had slurred speech and restless psychomotor activity. Pt made poor eye contact. Pt presented with an anxious mood; affect was congruent. Pt denied HI/AV/H.  Chief  Complaint:  Chief Complaint  Patient presents with   Alcohol Intoxication   SI   Visit Diagnosis: Major depressive disorder, recurrent severe without psychotic features (HCC) PTSD, hx Alcohol Use Disorder    CCA Screening, Triage and Referral (STR)  Patient Reported Information How did you hear about [place]? Self  Referral name: No data recorded Referral phone number: No data recorded  Whom do you see for routine medical problems? No data recorded Practice/Facility Name: No data recorded Practice/Facility Phone Number: No data recorded Name of Contact: No data recorded Contact Number: No data recorded Contact Fax Number: No data recorded Prescriber Name: No data recorded Prescriber Address (if known): No data recorded  What Is the Reason for Your Visit/Call Today? Patient having thoughts of ending his life by overdosing.  How Long Has This Been Causing You Problems? <Week  What Do You Feel Would Help You the Most Today? Treatment for Depression or other mood problem; Alcohol or Drug Use Treatment   Have You Recently Been in Any Inpatient Treatment (Hospital/Detox/Crisis Center/28-Day Program)? No data recorded Name/Location of Program/Hospital:No data recorded How Long Were You There? No data recorded When Were You Discharged? No data recorded  Have You Ever Received Services From [facility] Before? No data recorded Who Do You See at [facility]? No data recorded  Have You Recently Had Any Thoughts About Hurting Yourself? Yes  Are You Planning to Commit Suicide/Harm Yourself At This time? Yes   Have you Recently Had Thoughts About Hurting Someone Karolee Ohs? No  Explanation: n/a   Have You Used Any Alcohol or Drugs in  the Past 24 Hours? Yes  How Long Ago Did You Use Drugs or Alcohol? No data recorded What Did You Use and How Much? Cannabis   Do You Currently Have a Therapist/Psychiatrist? No  Name of Therapist/Psychiatrist: [facility]   Have  You Been Recently Discharged From Any Office Practice or Programs? No  Explanation of Discharge From Practice/Program: From 10/28/2022 to 11/08/2022-admitted to [facility]     CCA Screening Triage Referral Assessment Type of Contact: Tele-Assessment  Is this Initial or Reassessment? Initial Assessment  Date Telepsych consult ordered in [facility]:   (n/a)  Time Telepsych consult ordered in [facility]:  0000 (n/a)   Patient Reported Information Reviewed? No data recorded Patient Left Without Being Seen? No data recorded Reason for Not Completing Assessment: No data recorded  Collateral Involvement: none reported   Does Patient Have a Court Appointed Legal Guardian? No data recorded Name and Contact of Legal Guardian: No data recorded If Minor and Not Living with Parent(s), Who has Custody? n/a  Is CPS involved or ever been involved? Never  Is APS involved or ever been involved? Never   Patient Determined To Be At Risk for Harm To Self or Others Based on Review of Patient Reported Information or Presenting Complaint? Yes, for Self-Harm  Method: Plan with intent and identified person  Availability of Means: In hand or used  Intent: Clearly intends on inflicting harm that could cause death  Notification Required: No need or identified person  Additional Information for Danger to Others Potential: -- (n/a)  Additional Comments for Danger to Others Potential: n/a  Are There Guns or Other Weapons in Your Home? No  Types of Guns/Weapons: n/a  Are These Weapons Safely Secured?                            No  Who Could Verify You Are Able To Have These Secured: n/a  Do You Have any Outstanding Charges, Pending Court Dates, Parole/Probation? none reported  Contacted To Inform of Risk of Harm To Self or Others: Other: Comment (n/a)   Location of Assessment: [facility] ED   Does Patient Present under Involuntary Commitment? Yes  IVC Papers Initial File Date: No data recorded  [place] of  Residence: [place]   Patient Currently Receiving the Following Services: Not Receiving Services   Determination of Need: Emergent (2 hours)   Options For Referral: ED Visit; Inpatient Hospitalization     CCA Biopsychosocial Intake/Chief Complaint:  No data recorded Current Symptoms/Problems: No data recorded  Patient Reported Schizophrenia/Schizoaffective Diagnosis in Past: No   Strengths: Have some insight, seeking help and pleasant.  Preferences: No data recorded Abilities: No data recorded  Type of Services Patient Feels are Needed: No data recorded  Initial Clinical Notes/Concerns: No data recorded  Mental Health Symptoms Depression:  Hopelessness; Change in energy/activity; Tearfulness   Duration of Depressive symptoms: Greater than two weeks   Mania:  N/A   Anxiety:   N/A   Psychosis:  None   Duration of Psychotic symptoms: No data recorded  Trauma:  N/A   Obsessions:  N/A   Compulsions:  N/A   Inattention:  N/A   Hyperactivity/Impulsivity:  N/A   Oppositional/Defiant Behaviors:  N/A   Emotional Irregularity:  N/A   Other Mood/Personality Symptoms:  None noted    Mental Status Exam Appearance and self-care  Stature:  Average   Weight:  Average weight   Clothing:  -- (Scrubs)  Grooming:  Normal   Cosmetic use:  None   Posture/gait:  Normal   Motor activity:  -- (Within normal range)   Sensorium  Attention:  Normal   Concentration:  Normal   Orientation:  X5   Recall/memory:  Normal   Affect and Mood  Affect:  Appropriate; Depressed; Full Range   Mood:  Depressed; Anxious   Relating  Eye contact:  Normal   Facial expression:  Depressed; Anxious; Responsive   Attitude toward examiner:  Cooperative   Thought and Language  Speech flow: Clear and Coherent   Thought content:  Appropriate to Mood and Circumstances   Preoccupation:  None   Hallucinations:  None   Organization:  No data recorded  [place] of Knowledge:  Fair   Intelligence:  Average   Abstraction:  Normal; Functional   Judgement:  Fair   Dance movement psychotherapist:  Realistic   Insight:  Fair   Decision Making:  Impulsive   Social Functioning  Social Maturity:  Isolates   Social Judgement:  Normal; "Street Smart"   Stress  Stressors:  Transitions   Coping Ability:  Exhausted; Overwhelmed   Skill Deficits:  None   Supports:  Family     Religion:    Leisure/Recreation:    Exercise/Diet:     CCA Employment/Education Employment/Work Situation:    Education:     CCA Family/Childhood History Family and Relationship History:    Childhood History:     Child/Adolescent Assessment:     CCA Substance Use Alcohol/Drug Use:                           ASAM's:  Six Dimensions of Multidimensional Assessment  Dimension 1:  Acute Intoxication and/or Withdrawal Potential:      Dimension 2:  Biomedical Conditions and Complications:      Dimension 3:  Emotional, Behavioral, or Cognitive Conditions and Complications:     Dimension 4:  Readiness to Change:     Dimension 5:  Relapse, Continued use, or Continued Problem Potential:     Dimension 6:  Recovery/Living Environment:     ASAM Severity Score:    ASAM Recommended Level of Treatment:     Substance use Disorder (SUD)    Recommendations for Services/Supports/Treatments:    DSM5 Diagnoses: Patient Active Problem List   Diagnosis Date Noted   Major depressive disorder, recurrent severe without psychotic features (HCC) 08/09/2023   History of posttraumatic stress disorder (PTSD) 08/08/2023   Ingestion of substance 08/08/2023   Methamphetamine abuse (HCC) 03/28/2023   Suicidal ideation 02/04/2023   Major depressive disorder 01/23/2023   Major depressive disorder, recurrent, severe without psychotic behavior (HCC) 10/27/2022   Suicide attempt (HCC) 10/27/2022   Traumatic brain injury (HCC) 09/22/2022   Seizure  disorder (HCC) 09/22/2022   Cocaine abuse (HCC) 09/22/2022   Alcohol abuse 09/22/2022    Patient Centered Plan: Patient is on the following Treatment Plan(s):  Anxiety, Post Traumatic Stress Disorder, and Substance Abuse  [email or web address] @  [address] Arelyn Gauer, LCAS

## 2023-09-04 DIAGNOSIS — F1910 Other psychoactive substance abuse, uncomplicated: Secondary | ICD-10-CM | POA: Insufficient documentation

## 2023-09-04 MED ORDER — ADULT MULTIVITAMIN W/MINERALS CH
1.0000 | ORAL_TABLET | Freq: Every day | ORAL | Status: DC
  Administered 2023-09-04 – 2023-09-10 (×7): 1 via ORAL
  Filled 2023-09-04 (×7): qty 1

## 2023-09-04 MED ORDER — ONDANSETRON 4 MG PO TBDP
4.0000 mg | ORAL_TABLET | Freq: Four times a day (QID) | ORAL | Status: AC | PRN
  Administered 2023-09-05: 4 mg via ORAL
  Filled 2023-09-04: qty 1

## 2023-09-04 MED ORDER — HYDROXYZINE HCL 25 MG PO TABS
25.0000 mg | ORAL_TABLET | Freq: Four times a day (QID) | ORAL | Status: AC | PRN
  Administered 2023-09-05: 25 mg via ORAL
  Filled 2023-09-04: qty 1

## 2023-09-04 MED ORDER — LOPERAMIDE HCL 2 MG PO CAPS: 2.0000 mg | ORAL_CAPSULE | ORAL | Status: AC | PRN

## 2023-09-04 MED ORDER — THIAMINE HCL 100 MG/ML IJ SOLN: 100.0000 mg | Freq: Once | INTRAMUSCULAR | Status: DC

## 2023-09-04 MED ORDER — RISPERIDONE 1 MG PO TABS
2.0000 mg | ORAL_TABLET | Freq: Every day | ORAL | Status: DC
Start: 2023-09-04 — End: 2023-09-10
  Administered 2023-09-04 – 2023-09-09 (×6): 2 mg via ORAL
  Filled 2023-09-04 (×6): qty 2

## 2023-09-04 MED ORDER — TRAZODONE HCL 50 MG PO TABS
150.0000 mg | ORAL_TABLET | Freq: Every day | ORAL | Status: DC
  Administered 2023-09-04 – 2023-09-08 (×5): 150 mg via ORAL
  Filled 2023-09-04 (×5): qty 1

## 2023-09-04 MED ORDER — FLUOXETINE HCL 20 MG PO CAPS
60.0000 mg | ORAL_CAPSULE | Freq: Every day | ORAL | Status: DC
  Administered 2023-09-04 – 2023-09-10 (×7): 60 mg via ORAL
  Filled 2023-09-04 (×7): qty 3

## 2023-09-04 MED ORDER — CHLORDIAZEPOXIDE HCL 25 MG PO CAPS: 25.0000 mg | ORAL_CAPSULE | Freq: Four times a day (QID) | ORAL | Status: AC | PRN

## 2023-09-04 MED ORDER — THIAMINE MONONITRATE 100 MG PO TABS
100.0000 mg | ORAL_TABLET | Freq: Every day | ORAL | Status: DC
  Administered 2023-09-04 – 2023-09-10 (×7): 100 mg via ORAL
  Filled 2023-09-04 (×7): qty 1

## 2023-09-04 NOTE — Group Note (Signed)
 Date:  09/04/2023 Time:  10:11 PM  Group Topic/Focus:  Wrap-Up Group:   The focus of this group is to help patients review their daily goal of treatment and discuss progress on daily workbooks.    Participation Level:  Did Not Attend   Lenore Cordia 09/04/2023, 10:11 PM

## 2023-09-04 NOTE — Plan of Care (Signed)
 D: Pt alert and oriented. Pt reports experiencing anxiety/depression at this time. Pt denies experiencing any pain at this time. Pt denies experiencing any SI/HI, or AH at this time however, endorses VH. Pt reports seeing shadows.   A: Scheduled medications administered to pt, per MD orders. Support and encouragement provided. Frequent verbal contact made. Routine safety checks conducted q15 minutes.   R: No adverse drug reactions noted. Pt verbally contracts for safety at this time. Pt compliant with medications and treatment plan. Pt interacts well with others on the unit. Pt remains safe at this time. Plan of care ongoing.  Problem: Education: Goal: Emotional status will improve Outcome: Not Progressing   Problem: Activity: Goal: Interest or engagement in activities will improve Outcome: Progressing

## 2023-09-04 NOTE — Group Note (Signed)
 Date:  09/04/2023 Time:  10:16 AM  Group Topic/Focus:  Goals Group:   The focus of this group is to help patients establish daily goals to achieve during treatment and discuss how the patient can incorporate goal setting into their daily lives to aide in recovery.    Participation Level:  Active  Participation Quality:  Appropriate  Affect:  Appropriate  Cognitive:  Appropriate  Insight: Appropriate  Engagement in Group:  Engaged  Modes of Intervention:  Discussion and Education  Additional Comments:    Wilford Corner 09/04/2023, 10:16 AM

## 2023-09-04 NOTE — H&P (Signed)
 Psychiatric Admission Assessment Adult  Patient Identification: Raymond [name] MRN:  [id number] Date of Evaluation:  09/04/2023 Chief Complaint:  MDD (major depressive disorder) [F32.9] Principal Diagnosis: <principal problem not specified> Diagnosis:  Active Problems:   MDD (major depressive disorder), recurrent episode, severe (HCC)   MDD (major depressive disorder)   Substance abuse (HCC)  History of Present Illness: Raymond [name] is a 43 year-old male when presented to the emergency department  yesterday via EMS on 09/02/2023 after he reportedly took 70 pills of all his medication regimen.  He reports that he did that because he was feeling overwhelmed and wanting to kill himself. He also reports that he had been drinking alcohol increasingly to the point of losing awareness. Patient presented to the ED in intoxication state and his alcohol level was 283. His UDS showed cannabinoids in his system. Psych consult was ordered and  inpatient treatment was recommended.  Per chart review, patient was recently  evaluated at [facility] health Emergency Department on 08/30/2023 due to suicidal threats. Upon discharge, patient was recommended to follow up with PCP.  On 08/09/2023, patient was admitted to [facility] under Dr Sherron Flemings for worsening depression, anxiety and panic attacks along with suicidal ideations. He reported that he had overdosed on 90 pills of 500 mg Kepra and Trazodone. His medications were adjusted  and, upon discharge, patient was to follow up at [facility] and appointment was scheduled.  Today patient reports the same symptoms although he reports that he has community services . He reports that [name] from his ACT team has been coming to see him. Chart review indicates that patient has had multiple visits to the ED  in recent months, and many  resulting into inpatient hospitalizations. Patient reports that he also goes to the [place] for medication management. He reports having alcohol problem and  requests treatment.   Assessment: Patient is evaluated face-to-face by this clinician after chart/nursing note review. Raymond [name] is a 43 year old male sitting in his room. He is cooperative upon approach. His is casually dressed with decent hygiene. He appears anxious, restless and disheveled. He avoids eye contact and  speech is clear with normal volume. Patient is somewhat guarded and/or preoccupied. Patient has poor dental hygiene.   Patient states "I feel better but still dizzy and have nausea". He admits to taking 70 pills out of his prescribed medications and does not specify the name. He reports that he took the pills with a goal of killing himself as he was feeling overwhelmed, hopeless.  Patient also reports that he has a problem with substance use and his main concern is alcohol. He admits that he loses conscience when he is under alcohol influence and his suicidal thoughts intensify. He requests treatment for his addiction as well as his depression and anxiety.    Patient denies current medical concerns but reports having nausea and dizziness. He denies chest pain. Denies respiratory distress. He reports that his last seizure activity was a week ago. He reports that his appetite is good. Reports not sleeping well last night due to not taking his sleep medication.  Patient's home medications are as follow as of last discharge:  - Clonidine  0.1 mg PO BID - Docusate Sodium 100 mg PO BID - Fluoxetine 60 mg PO Daily - Levetiracetam 750 mg PO BID - Lisinopril 10 mg PO Daily - Nicotine gum 2 mg PO PRN - Protonix 20 mg PO Daily - Miralax 17 g PO Daily - Risperidone 2 mg PO HS -  Trazodone 150 mg PO HS - Vitamin D3 25 MCG PO Daily  Patient is willing to continue the above medication regimen in addition to alcohol treatment protocol.   Associated Signs/Symptoms: Depression Symptoms:  depressed mood, insomnia, psychomotor agitation, feelings of worthlessness/guilt, difficulty  concentrating, hopelessness, recurrent thoughts of death, suicidal attempt, anxiety, (Hypo) Manic Symptoms:  Distractibility, Hallucinations, Impulsivity, Irritable Mood, Anxiety Symptoms:  Panic Symptoms, Obsessive Compulsive Symptoms:   None,, Social Anxiety, Psychotic Symptoms:  Hallucinations: Visual PTSD Symptoms: Had a traumatic exposure:  Past Total Time spent with patient: 1 hour  Past Psychiatric History: Substance use, GAD, PTSD, MDD  Is the patient at risk to self? Yes.    Has the patient been a risk to self in the past 6 months? Yes.    Has the patient been a risk to self within the distant past? Yes.    Is the patient a risk to others? No.  Has the patient been a risk to others in the past 6 months? No.  Has the patient been a risk to others within the distant past? No.   [place] Scale:  Flowsheet Row Admission (Current) from 09/03/2023 in [facility] INPATIENT [facility]AL MEDICINE ED from 09/02/2023 in [facility] Emergency Department at [facility] ED from 08/30/2023 in [facility] Emergency Department at [facility]  C-SSRS RISK CATEGORY High Risk High Risk Error: Q3, 4, or 5 should not be populated when Q2 is No        Prior Inpatient Therapy: Yes.   If yes, describe: Multiple  Prior Outpatient Therapy: Yes.   If yes, describe: Currently has ACT team/[facility] services   Alcohol Screening: 1. How often do you have a drink containing alcohol?: 2 to 4 times a month 2. How many drinks containing alcohol do you have on a typical day when you are drinking?: 1 or 2 3. How often do you have six or more drinks on one occasion?: Less than monthly AUDIT-C Score: 3 4. How often during the last year have you found that you were not able to stop drinking once you had started?: Never 5. How often during the last year have you failed to do what was normally expected from you because of drinking?: Never 6. How often during the last year have you needed a first drink in the  morning to get yourself going after a heavy drinking session?: Never 7. How often during the last year have you had a feeling of guilt of remorse after drinking?: Never 8. How often during the last year have you been unable to remember what happened the night before because you had been drinking?: Never 9. Have you or someone else been injured as a result of your drinking?: No 10. Has a relative or friend or a doctor or another health worker been concerned about your drinking or suggested you cut down?: No Alcohol Use Disorder Identification Test Final Score (AUDIT): 3 Alcohol Brief Interventions/Follow-up: Alcohol education/Brief advice Substance Abuse History in the last 12 months:  Yes.   Consequences of Substance Abuse: Medical Consequences:  Seizures Legal Consequences:  Reports having legal charges Family Consequences:  No support Blackouts:  Frequent Withdrawal Symptoms:   Headaches Nausea Tremors Previous Psychotropic Medications: Yes  Psychological Evaluations: Yes  Past Medical History:  Past Medical History:  Diagnosis Date   Alcohol abuse    Anxiety    Hepatitis C    PTSD (post-traumatic stress disorder)     Past Surgical History:  Procedure Laterality Date  FACIAL FRACTURE SURGERY     metal plate under right eye   Family History: History reviewed. No pertinent family history. Family Psychiatric  History: NA Tobacco Screening:  Social History   Tobacco Use  Smoking Status Every Day   Current packs/day: 1.00   Types: Cigarettes   Passive exposure: Current  Smokeless Tobacco Never    BH Tobacco Counseling     Are you interested in Tobacco Cessation Medications?  Yes, implement Nicotene Replacement Protocol Counseled patient on smoking cessation:  Refused/Declined practical counseling Reason Tobacco Screening Not Completed: No value filed.       Social History:  Social History   Substance and Sexual Activity  Alcohol Use Yes   Comment: 1 liter bottle  of vodka prior to arrival     Social History   Substance and Sexual Activity  Drug Use Yes   Types: Methamphetamines    Additional Social History: Marital status: Single Are you sexually active?: No What is your sexual orientation?: Heterosexual. Has your sexual activity been affected by drugs, alcohol, medication, or emotional stress?: Patient denies. Does patient have children?: No ("I lost 2 babies.")                         Allergies:   Allergies  Allergen Reactions   Peanut Butter Flavor [Flavoring Agent]    Other Itching, Rash and Other (See Comments)    Peanuts   Tylenol [Acetaminophen] Rash   Lab Results:  Results for orders placed or performed during the hospital encounter of 09/02/23 (from the past 48 hours)  CBC with Differential     Status: None   Collection Time: 09/02/23  3:47 PM  Result Value Ref Range   WBC 7.7 4.0 - 10.5 K/uL   RBC 5.25 4.22 - 5.81 MIL/uL   Hemoglobin 16.8 13.0 - 17.0 g/dL   HCT 16.1 09.6 - 04.5 %   MCV 91.8 80.0 - 100.0 fL   MCH 32.0 26.0 - 34.0 pg   MCHC 34.9 30.0 - 36.0 g/dL   RDW 40.9 81.1 - 91.4 %   Platelets 288 150 - 400 K/uL   nRBC 0.0 0.0 - 0.2 %   Neutrophils Relative % 81 %   Neutro Abs 6.2 1.7 - 7.7 K/uL   Lymphocytes Relative 15 %   Lymphs Abs 1.2 0.7 - 4.0 K/uL   Monocytes Relative 4 %   Monocytes Absolute 0.3 0.1 - 1.0 K/uL   Eosinophils Relative 0 %   Eosinophils Absolute 0.0 0.0 - 0.5 K/uL   Basophils Relative 0 %   Basophils Absolute 0.0 0.0 - 0.1 K/uL   Immature Granulocytes 0 %   Abs Immature Granulocytes 0.02 0.00 - 0.07 K/uL    Comment: Performed at [facility], [address] [place], [place] [address]  Comprehensive metabolic panel     Status: Abnormal   Collection Time: 09/02/23  3:47 PM  Result Value Ref Range   Sodium 137 135 - 145 mmol/L   Potassium 3.9 3.5 - 5.1 mmol/L   Chloride 104 98 - 111 mmol/L   CO2 22 22 - 32 mmol/L   Glucose, Bld 102 (H) 70 - 99 mg/dL    Comment:  Glucose reference range applies only to samples taken after fasting for at least 8 hours.   BUN 10 6 - 20 mg/dL   Creatinine, Ser 6.21 0.61 - 1.24 mg/dL   Calcium 8.5 (L) 8.9 - 10.3 mg/dL   Total  Protein 7.6 6.5 - 8.1 g/dL   Albumin 4.3 3.5 - 5.0 g/dL   AST 37 15 - 41 U/L   ALT 28 0 - 44 U/L   Alkaline Phosphatase 70 38 - 126 U/L   Total Bilirubin 0.7 0.0 - 1.2 mg/dL   GFR, Estimated >57 >84 mL/min    Comment: (NOTE) Calculated using the CKD-EPI Creatinine Equation (2021)    Anion gap 11 5 - 15    Comment: Performed at [facility], [address] [place], [place] [address]  Ethanol     Status: Abnormal   Collection Time: 09/02/23  3:47 PM  Result Value Ref Range   Alcohol, Ethyl (B) 283 (H) <10 mg/dL    Comment: (NOTE) Lowest detectable limit for serum alcohol is 10 mg/dL.  For medical purposes only. Performed at [facility], [address] [place], [place] [address]   Acetaminophen level     Status: Abnormal   Collection Time: 09/02/23  3:47 PM  Result Value Ref Range   Acetaminophen (Tylenol), Serum <10 (L) 10 - 30 ug/mL    Comment: (NOTE) Therapeutic concentrations vary significantly. A range of 10-30 ug/mL  may be an effective concentration for many patients. However, some  are best treated at concentrations outside of this range. Acetaminophen concentrations >150 ug/mL at 4 hours after ingestion  and >50 ug/mL at 12 hours after ingestion are often associated with  toxic reactions.  Performed at [facility], [address] [place], [place] [address]   Salicylate level     Status: Abnormal   Collection Time: 09/02/23  3:47 PM  Result Value Ref Range   Salicylate Lvl <7.0 (L) 7.0 - 30.0 mg/dL    Comment: Performed at [facility], [address] [place], [place] [address]  Urine Drug Screen, Qualitative ([facility] only)     Status: Abnormal   Collection Time: 09/02/23  7:30 PM  Result Value Ref Range   Tricyclic, Ur Screen  NONE DETECTED NONE DETECTED   Amphetamines, Ur Screen NONE DETECTED NONE DETECTED   MDMA (Ecstasy)Ur Screen NONE DETECTED NONE DETECTED   Cocaine Metabolite,Ur [place] NONE DETECTED NONE DETECTED   Opiate, Ur Screen NONE DETECTED NONE DETECTED   Phencyclidine (PCP) Ur S NONE DETECTED NONE DETECTED   Cannabinoid 50 Ng, Ur [place] POSITIVE (A) NONE DETECTED   Barbiturates, Ur Screen NONE DETECTED NONE DETECTED   Benzodiazepine, Ur Scrn NONE DETECTED NONE DETECTED   Methadone Scn, Ur NONE DETECTED NONE DETECTED    Comment: (NOTE) Tricyclics + metabolites, urine    Cutoff 1000 ng/mL Amphetamines + metabolites, urine  Cutoff 1000 ng/mL MDMA (Ecstasy), urine              Cutoff 500 ng/mL Cocaine Metabolite, urine          Cutoff 300 ng/mL Opiate + metabolites, urine        Cutoff 300 ng/mL Phencyclidine (PCP), urine         Cutoff 25 ng/mL Cannabinoid, urine                 Cutoff 50 ng/mL Barbiturates + metabolites, urine  Cutoff 200 ng/mL Benzodiazepine, urine              Cutoff 200 ng/mL Methadone, urine                   Cutoff 300 ng/mL  The urine drug screen provides only a preliminary, unconfirmed analytical test result and should not  be used for non-medical purposes. Clinical consideration and professional judgment should be applied to any positive drug screen result due to possible interfering substances. A more specific alternate chemical method must be used in order to obtain a confirmed analytical result. Gas chromatography / mass spectrometry (GC/MS) is the preferred confirm atory method. Performed at [facility], [address] [place], [place] [address]     Blood Alcohol level:  Lab Results  Component Value Date   ETH 283 (H) 09/02/2023   ETH 142 (H) 08/30/2023    Metabolic Disorder Labs:  Lab Results  Component Value Date   HGBA1C 5.3 08/11/2023   MPG 105.41 08/11/2023   MPG 108.28 03/28/2023   Lab Results  Component Value Date   PROLACTIN 12.0  01/22/2023   PROLACTIN 19.5 11/20/2022   Lab Results  Component Value Date   CHOL 172 08/11/2023   TRIG 146 08/11/2023   HDL 53 08/11/2023   CHOLHDL 3.2 08/11/2023   VLDL 29 08/11/2023   LDLCALC 90 08/11/2023   LDLCALC 84 03/28/2023    Current Medications: Current Facility-Administered Medications  Medication Dose Route Frequency Provider Last Rate Last Admin   alum & mag hydroxide-simeth (MAALOX/MYLANTA) 200-200-20 MG/5ML suspension 30 mL  30 mL Oral Q4H PRN Penn, Cranston Neighbor, NP       levETIRAcetam (KEPPRA) tablet 1,000 mg  1,000 mg Oral BID Onuoha, Chinwendu V, NP   1,000 mg at 09/04/23 6045   nicotine polacrilex (NICORETTE) gum 2 mg  2 mg Oral PRN Verner Chol, MD       OLANZapine (ZYPREXA) injection 10 mg  10 mg Intramuscular TID PRN Penn, Cranston Neighbor, NP       OLANZapine (ZYPREXA) injection 5 mg  5 mg Intramuscular TID PRN Mcneil Sober, NP       OLANZapine zydis (ZYPREXA) disintegrating tablet 5 mg  5 mg Oral TID PRN Mcneil Sober, NP   5 mg at 09/03/23 2200   PTA Medications: Medications Prior to Admission  Medication Sig Dispense Refill Last Dose/Taking   albuterol (VENTOLIN HFA) 108 (90 Base) MCG/ACT inhaler Inhale 2 puffs into the lungs every 4 (four) hours as needed for wheezing or shortness of breath. 6.7 g 0 Unknown   cloNIDine (CATAPRES) 0.1 MG tablet Take 1 tablet (0.1 mg total) by mouth 2 (two) times daily. (Patient not taking: Reported on 09/03/2023) 60 tablet 11 Unknown   docusate sodium (COLACE) 100 MG capsule Take 1 capsule (100 mg total) by mouth 2 (two) times daily. 10 capsule 0 Unknown   FLUoxetine (PROZAC) 20 MG capsule Take 3 capsules (60 mg total) by mouth daily. 90 capsule 3 Unknown   levETIRAcetam (KEPPRA) 1000 MG tablet Take 1,000 mg by mouth 2 (two) times daily.   Unknown   lisinopril (ZESTRIL) 10 MG tablet Take 1 tablet (10 mg total) by mouth daily. 10 tablet 0 Unknown   nicotine polacrilex (NICORETTE) 2 MG gum Take 1 each (2 mg total) by mouth as needed for  smoking cessation. 100 tablet 0 Unknown   pantoprazole (PROTONIX) 20 MG tablet Take 1 tablet (20 mg total) by mouth daily. 30 tablet 0 Unknown   polyethylene glycol (MIRALAX / GLYCOLAX) 17 g packet Take 17 g by mouth daily. 30 packet 0 Unknown   risperiDONE (RISPERDAL) 2 MG tablet Take 1 tablet (2 mg total) by mouth at bedtime. (Patient taking differently: Take 1-3 mg by mouth 2 (two) times daily. Take 1 mg (one-half tablet) by mouth every morning and 3 mg (one and  one-half tablets) at bedtime) 30 tablet 0 Unknown   traZODone (DESYREL) 150 MG tablet Take 1 tablet (150 mg total) by mouth at bedtime. 30 tablet 0 Unknown   vitamin D3 (CHOLECALCIFEROL) 25 MCG tablet Take 1 tablet (1,000 Units total) by mouth daily. 60 tablet 0 Unknown    Musculoskeletal: Strength & Muscle Tone: within normal limits Gait & Station: normal Patient leans: N/A            Psychiatric Specialty Exam:  Presentation  General Appearance:  Casual  Eye Contact: Minimal (diminished, avoiding)  Speech: Clear and Coherent  Speech Volume: Normal  Handedness: Right   Mood and Affect  Mood: Anxious; Depressed  Affect: Congruent   Thought Process  Thought Processes: Coherent  Duration of Psychotic Symptoms: Currently reporting visual hallucinations "I am seeing shadows" Past Diagnosis of Schizophrenia or Psychoactive disorder: No  Descriptions of Associations:Intact  Orientation:Full (Time, Place and Person)  Thought Content:WDL  Hallucinations:Hallucinations: Visual Description of Visual Hallucinations: I see shadows  Ideas of Reference:None  Suicidal Thoughts:Suicidal Thoughts: No  Homicidal Thoughts:Homicidal Thoughts: No   Sensorium  Memory: Immediate Fair; Recent Fair; Remote Fair  Judgment: Fair  Insight: Fair   Art therapist  Concentration: Fair  Attention Span: Fair  Recall: [employer] of Knowledge: Fair  Language: Fair   Psychomotor  Activity  Psychomotor Activity: Psychomotor Activity: Normal   Assets  Assets: Communication Skills; Desire for Improvement; Physical Health   Sleep  Sleep: Sleep: Poor Number of Hours of Sleep: 2    Physical Exam: Physical Exam Review of Systems  Constitutional: Negative.   HENT: Negative.    Eyes: Negative.   Respiratory: Negative.    Cardiovascular: Negative.   Gastrointestinal: Negative.   Genitourinary: Negative.   Musculoskeletal: Negative.   Skin: Negative.   Neurological: Negative.   Endo/Heme/Allergies: Negative.   Psychiatric/Behavioral:  Positive for depression, hallucinations, substance abuse and suicidal ideas. The patient is nervous/anxious and has insomnia.    Blood pressure (!) 124/102, pulse 72, temperature 97.9 F (36.6 C), temperature source Oral, resp. rate 16, SpO2 98%. There is no height or weight on file to calculate BMI.  Treatment Plan Summary: Daily contact with patient to assess and evaluate symptoms and progress in treatment, Medication management, and Plan to complete alcohol detox protocol  and recommend rehab services.   Observation Level/Precautions:  Detox 15 minute checks Seizure  Laboratory:   Completed  Psychotherapy:    Medications:    Consultations:    Discharge Concerns:    Estimated LOS:  Other:     Physician Treatment Plan for Primary Diagnosis: <principal problem not specified> Long Term Goal(s): Improvement in symptoms so as ready for discharge and continue with rehab services  Short Term Goals: Ability to identify changes in lifestyle to reduce recurrence of condition will improve, Ability to verbalize feelings will improve, Ability to disclose and discuss suicidal ideas, Ability to demonstrate self-control will improve, Ability to identify and develop effective coping behaviors will improve, Ability to maintain clinical measurements within normal limits will improve, Compliance with prescribed medications will improve,  and Ability to identify triggers associated with substance abuse/mental health issues will improve  Physician Treatment Plan for Secondary Diagnosis: Active Problems:   MDD (major depressive disorder), recurrent episode, severe (HCC)   MDD (major depressive disorder)   Substance abuse (HCC)  Long Term Goal(s): Improvement in symptoms so as ready for discharge and start rehab services  Short Term Goals: Ability to identify changes in lifestyle to reduce  recurrence of condition will improve, Ability to verbalize feelings will improve, Ability to disclose and discuss suicidal ideas, Ability to demonstrate self-control will improve, Ability to identify and develop effective coping behaviors will improve, Ability to maintain clinical measurements within normal limits will improve, Compliance with prescribed medications will improve, and Ability to identify triggers associated with substance abuse/mental health issues will improve  I certify that inpatient services furnished can reasonably be expected to improve the patient's condition.    Olin Pia, NP 4/4/202511:59 AM

## 2023-09-04 NOTE — Plan of Care (Signed)
   Problem: Education: Goal: Knowledge of [facility] Education information/materials will improve Outcome: Progressing   Problem: Activity: Goal: Interest or engagement in activities will improve Outcome: Progressing   Problem: Coping: Goal: Ability to verbalize frustrations and anger appropriately will improve Outcome: Progressing

## 2023-09-04 NOTE — Group Note (Signed)
 Recreation Therapy Group Note   Group Topic:Leisure Education  Group Date: 09/04/2023 Start Time: 1000 End Time: 1100 Facilitators: Rosina Lowenstein, LRT, CTRS Location:  Craft Room  Group Description: Leisure. Patients were given the option to choose from singing karaoke, coloring mandalas, using oil pastels, journaling, or playing with play-doh. LRT and pts discussed the meaning of leisure, the importance of participating in leisure during their free time/when they're outside of the hospital, as well as how our leisure interests can also serve as coping skills.  Goal Area(s) Addressed:  Patient will identify a current leisure interest.  Patient will learn the definition of "leisure". Patient will practice making a positive decision. Patient will have the opportunity to try a new leisure activity. Patient will communicate with peers and LRT.    Affect/Mood: Appropriate   Participation Level: Active and Engaged   Participation Quality: Independent   Behavior: Calm and Cooperative   Speech/Thought Process: Coherent   Insight: Fair   Judgement: Fair    Modes of Intervention: Education, Exploration, Music, Rapport Building, and Socialization   Patient Response to Interventions:  Attentive, Engaged, Interested , and Receptive   Education Outcome:  Acknowledges education   Clinical Observations/Individualized Feedback: Raymond [name] was active in their participation of session activities and group discussion. Pt identified "play sports and sing" as things he does in his free time. Pt chose to sing karaoke while in group. Pt interacted well with LRT and peers duration of session.    Plan: Continue to engage patient in RT group sessions 2-3x/week.   Rosina Lowenstein, LRT, CTRS 09/04/2023 1:27 PM

## 2023-09-04 NOTE — BH IP Treatment Plan (Signed)
 Interdisciplinary Treatment and Diagnostic Plan Update  09/04/2023 Time of Session: 11:17 AM Raymond [name] MRN: [id number]  Principal Diagnosis: <principal problem not specified>  Secondary Diagnoses: Active Problems:   MDD (major depressive disorder), recurrent episode, severe (HCC)   MDD (major depressive disorder)   Substance abuse (HCC)   Current Medications:  Current Facility-Administered Medications  Medication Dose Route Frequency Provider Last Rate Last Admin   alum & mag hydroxide-simeth (MAALOX/MYLANTA) 200-200-20 MG/5ML suspension 30 mL  30 mL Oral Q4H PRN Penn, Cicely, NP       chlordiazePOXIDE (LIBRIUM) capsule 25 mg  25 mg Oral Q6H PRN Rayburn Go, Veronique M, NP       FLUoxetine (PROZAC) capsule 60 mg  60 mg Oral Daily Rayburn Go, Veronique M, NP       hydrOXYzine (ATARAX) tablet 25 mg  25 mg Oral Q6H PRN Rayburn Go, Veronique M, NP       levETIRAcetam (KEPPRA) tablet 1,000 mg  1,000 mg Oral BID Onuoha, Chinwendu V, NP   1,000 mg at 09/04/23 4098   loperamide (IMODIUM) capsule 2-4 mg  2-4 mg Oral PRN Marlou Sa, NP       multivitamin with minerals tablet 1 tablet  1 tablet Oral Daily Rayburn Go, Veronique M, NP       nicotine polacrilex (NICORETTE) gum 2 mg  2 mg Oral PRN Verner Chol, MD       OLANZapine (ZYPREXA) injection 10 mg  10 mg Intramuscular TID PRN Penn, Cranston Neighbor, NP       OLANZapine (ZYPREXA) injection 5 mg  5 mg Intramuscular TID PRN Mcneil Sober, NP       OLANZapine zydis (ZYPREXA) disintegrating tablet 5 mg  5 mg Oral TID PRN Mcneil Sober, NP   5 mg at 09/03/23 2200   ondansetron (ZOFRAN-ODT) disintegrating tablet 4 mg  4 mg Oral Q6H PRN Marlou Sa, NP       risperiDONE (RISPERDAL) tablet 2 mg  2 mg Oral QHS Rayburn Go, Veronique M, NP       thiamine (VITAMIN B1) tablet 100 mg  100 mg Oral Daily Verner Chol, MD       traZODone (DESYREL) tablet 150 mg  150 mg Oral QHS Marlou Sa, NP       PTA Medications: Medications Prior to  Admission  Medication Sig Dispense Refill Last Dose/Taking   albuterol (VENTOLIN HFA) 108 (90 Base) MCG/ACT inhaler Inhale 2 puffs into the lungs every 4 (four) hours as needed for wheezing or shortness of breath. 6.7 g 0 Unknown   cloNIDine (CATAPRES) 0.1 MG tablet Take 1 tablet (0.1 mg total) by mouth 2 (two) times daily. (Patient not taking: Reported on 09/03/2023) 60 tablet 11 Unknown   docusate sodium (COLACE) 100 MG capsule Take 1 capsule (100 mg total) by mouth 2 (two) times daily. 10 capsule 0 Unknown   FLUoxetine (PROZAC) 20 MG capsule Take 3 capsules (60 mg total) by mouth daily. 90 capsule 3 Unknown   levETIRAcetam (KEPPRA) 1000 MG tablet Take 1,000 mg by mouth 2 (two) times daily.   Unknown   lisinopril (ZESTRIL) 10 MG tablet Take 1 tablet (10 mg total) by mouth daily. 10 tablet 0 Unknown   nicotine polacrilex (NICORETTE) 2 MG gum Take 1 each (2 mg total) by mouth as needed for smoking cessation. 100 tablet 0 Unknown   pantoprazole (PROTONIX) 20 MG tablet Take 1 tablet (20 mg total) by mouth daily. 30 tablet 0 Unknown   polyethylene glycol (MIRALAX / GLYCOLAX) 17 g  packet Take 17 g by mouth daily. 30 packet 0 Unknown   risperiDONE (RISPERDAL) 2 MG tablet Take 1 tablet (2 mg total) by mouth at bedtime. (Patient taking differently: Take 1-3 mg by mouth 2 (two) times daily. Take 1 mg (one-half tablet) by mouth every morning and 3 mg (one and one-half tablets) at bedtime) 30 tablet 0 Unknown   traZODone (DESYREL) 150 MG tablet Take 1 tablet (150 mg total) by mouth at bedtime. 30 tablet 0 Unknown   vitamin D3 (CHOLECALCIFEROL) 25 MCG tablet Take 1 tablet (1,000 Units total) by mouth daily. 60 tablet 0 Unknown    Patient Stressors:    Patient Strengths:    Treatment Modalities: Medication Management, Group therapy, Case management,  1 to 1 session with clinician, Psychoeducation, Recreational therapy.   Physician Treatment Plan for Primary Diagnosis: <principal problem not  specified> Long Term Goal(s): Improvement in symptoms so as ready for discharge start rehab services continue with rehab services   Short Term Goals: Ability to identify changes in lifestyle to reduce recurrence of condition will improve Ability to verbalize feelings will improve Ability to disclose and discuss suicidal ideas Ability to demonstrate self-control will improve Ability to identify and develop effective coping behaviors will improve Ability to maintain clinical measurements within normal limits will improve Compliance with prescribed medications will improve Ability to identify triggers associated with substance abuse/mental health issues will improve  Medication Management: Evaluate patient's response, side effects, and tolerance of medication regimen.  Therapeutic Interventions: 1 to 1 sessions, Unit Group sessions and Medication administration.  Evaluation of Outcomes: Not Met  Physician Treatment Plan for Secondary Diagnosis: Active Problems:   MDD (major depressive disorder), recurrent episode, severe (HCC)   MDD (major depressive disorder)   Substance abuse (HCC)  Long Term Goal(s): Improvement in symptoms so as ready for discharge start rehab services continue with rehab services   Short Term Goals: Ability to identify changes in lifestyle to reduce recurrence of condition will improve Ability to verbalize feelings will improve Ability to disclose and discuss suicidal ideas Ability to demonstrate self-control will improve Ability to identify and develop effective coping behaviors will improve Ability to maintain clinical measurements within normal limits will improve Compliance with prescribed medications will improve Ability to identify triggers associated with substance abuse/mental health issues will improve     Medication Management: Evaluate patient's response, side effects, and tolerance of medication regimen.  Therapeutic Interventions: 1 to 1  sessions, Unit Group sessions and Medication administration.  Evaluation of Outcomes: Not Met   RN Treatment Plan for Primary Diagnosis: <principal problem not specified> Long Term Goal(s): Knowledge of disease and therapeutic regimen to maintain health will improve  Short Term Goals: Ability to verbalize frustration and anger appropriately will improve, Ability to demonstrate self-control, Ability to participate in decision making will improve, Ability to verbalize feelings will improve, Ability to disclose and discuss suicidal ideas, and Ability to identify and develop effective coping behaviors will improve  Medication Management: RN will administer medications as ordered by provider, will assess and evaluate patient's response and provide education to patient for prescribed medication. RN will report any adverse and/or side effects to prescribing provider.  Therapeutic Interventions: 1 on 1 counseling sessions, Psychoeducation, Medication administration, Evaluate responses to treatment, Monitor vital signs and CBGs as ordered, Perform/monitor CIWA, COWS, AIMS and Fall Risk screenings as ordered, Perform wound care treatments as ordered.  Evaluation of Outcomes: Not Met   LCSW Treatment Plan for Primary Diagnosis: <principal problem not specified>  Long Term Goal(s): Safe transition to appropriate next level of care at discharge, Engage patient in therapeutic group addressing interpersonal concerns.  Short Term Goals: Engage patient in aftercare planning with referrals and resources, Increase social support, Increase ability to appropriately verbalize feelings, Increase emotional regulation, Facilitate acceptance of mental health diagnosis and concerns, Facilitate patient progression through stages of change regarding substance use diagnoses and concerns, and Identify triggers associated with mental health/substance abuse issues  Therapeutic Interventions: Assess for all discharge needs, 1 to  1 time with Social worker, Explore available resources and support systems, Assess for adequacy in community support network, Educate family and significant other(s) on suicide prevention, Complete Psychosocial Assessment, Interpersonal group therapy.  Evaluation of Outcomes: Not Met   Progress in Treatment: Attending groups: Yes. Participating in groups: Yes. Taking medication as prescribed: Yes. Toleration medication: Yes. Family/Significant other contact made: Contact Attempts: No, Kalab Camps, [phone], Brother, has been identified by the patient as the family member/significant other with whom the patient will be residing, and identified as the person(s) who will aid the patient in the event of a mental health crisis.  With written consent from the patient, two attempts were made to provide suicide prevention education, prior to and/or following the patient's discharge.  We were unsuccessful in providing suicide prevention education.  A suicide education pamphlet was given to the patient to share with family/significant other. Second attempt is needed.    Patient understands diagnosis: Yes. and No. Discussing patient identified problems/goals with staff: Yes. Medical problems stabilized or resolved: Yes. Denies suicidal/homicidal ideation: Yes. Issues/concerns per patient self-inventory: No. Other: None  New problem(s) identified: No, Describe:  None  New Short Term/Long Term Goal(s): detox, elimination of symptoms of psychosis, medication management for mood stabilization; elimination of SI thoughts; development of comprehensive mental wellness/sobriety plan.    Patient Goals:  "I just want to get better but it's hard."  Discharge Plan or Barriers: CSW to assist in the development of appropriate discharge plan.     Reason for Continuation of Hospitalization: Aggression Anxiety Delusions  Depression Hallucinations Medication stabilization Suicidal ideation Withdrawal  symptoms  Estimated Length of Stay: 1-7 days.   Last 3 [place] Suicide Severity Risk Score: Flowsheet Row Admission (Current) from 09/03/2023 in [facility] ED from 09/02/2023 in [facility] Emergency Department at [facility] ED from 08/30/2023 in [facility] Emergency Department at [facility]  C-SSRS RISK CATEGORY High Risk High Risk Error: Q3, 4, or 5 should not be populated when Q2 is No       Last PHQ 2/9 Scores:     No data to display          Scribe for Treatment Team: Lowry Ram, LCSW 09/04/2023 2:52 PM

## 2023-09-04 NOTE — Progress Notes (Signed)
   09/04/23 0500  Psych Admission Type (Psych Patients Only)  Admission Status Voluntary  Psychosocial Assessment  Patient Complaints Depression  Eye Contact Fair  Facial Expression Animated  Affect Appropriate to circumstance  Speech Logical/coherent  Interaction Assertive  Motor Activity Fidgety  Appearance/Hygiene In scrubs  Behavior Characteristics Cooperative;Appropriate to situation  Mood Sad;Pleasant  Thought Process  Coherency Circumstantial  Content WDL  Delusions None reported or observed  Perception Hallucinations  Hallucination Visual  Judgment Impaired  Confusion None  Danger to Self  Current suicidal ideation? Denies  Agreement Not to Harm Self Yes  Description of Agreement verbal

## 2023-09-04 NOTE — Plan of Care (Signed)
  Problem: Education: Goal: Knowledge of [facility] Education information/materials will improve Outcome: Not Met (add Reason) Goal: Emotional status will improve Outcome: Not Met (add Reason) Goal: Mental status will improve Outcome: Not Met (add Reason) Goal: Verbalization of understanding the information provided will improve Outcome: Not Met (add Reason)   Problem: Activity: Goal: Interest or engagement in activities will improve Outcome: Not Met (add Reason) Goal: Sleeping patterns will improve Outcome: Not Met (add Reason)   Problem: Coping: Goal: Ability to verbalize frustrations and anger appropriately will improve Outcome: Not Met (add Reason) Goal: Ability to demonstrate self-control will improve Outcome: Not Met (add Reason)   Problem: Safety: Goal: Periods of time without injury will increase Outcome: Not Met (add Reason)   Problem: Safety: Goal: Periods of time without injury will increase Outcome: Not Met (add Reason)

## 2023-09-05 DIAGNOSIS — F1990 Other psychoactive substance use, unspecified, uncomplicated: Secondary | ICD-10-CM

## 2023-09-05 DIAGNOSIS — F333 Major depressive disorder, recurrent, severe with psychotic symptoms: Secondary | ICD-10-CM

## 2023-09-05 IMAGING — CT CT ANGIO CHEST
2 of 6 series · 18 of 36 positions shown · IV contrast (agent unspecified)
Comparison: None Available.

CLINICAL DATA: PE suspected, pneumonia shortness of breath

EXAM:
CT ANGIOGRAPHY CHEST WITH CONTRAST
TECHNIQUE: Multidetector CT imaging of the chest was performed using the
standard protocol during bolus administration of intravenous
contrast. Multiplanar CT image reconstructions and MIPs were
obtained to evaluate the vascular anatomy.

[Series 7: pe thins · axial · 0.82mm/px · z∈[+962,+1228]mm · 17 of 300 slices shown]
[im 17/300  lung]
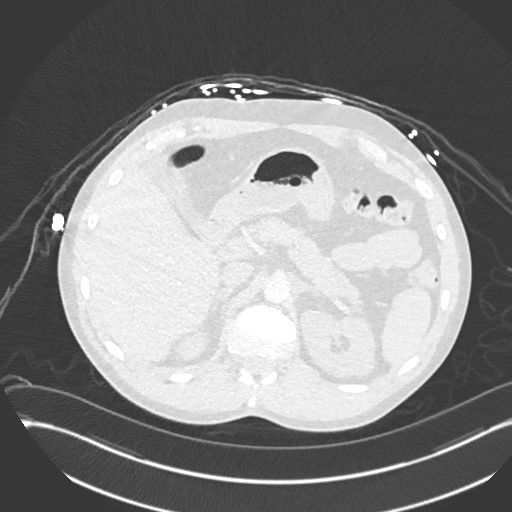
[im 34/300  mediastinal]
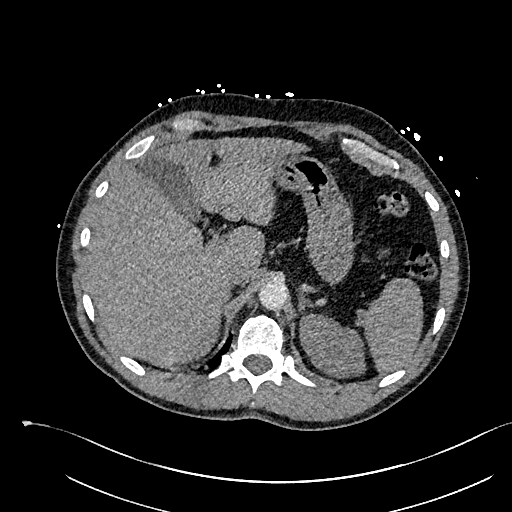
[im 50/300  lung]
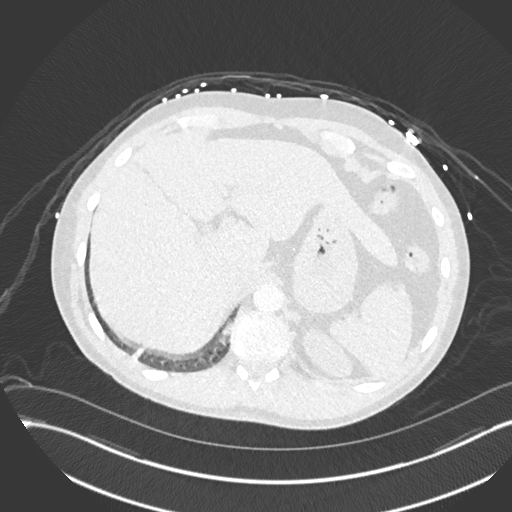
[im 67/300  mediastinal]
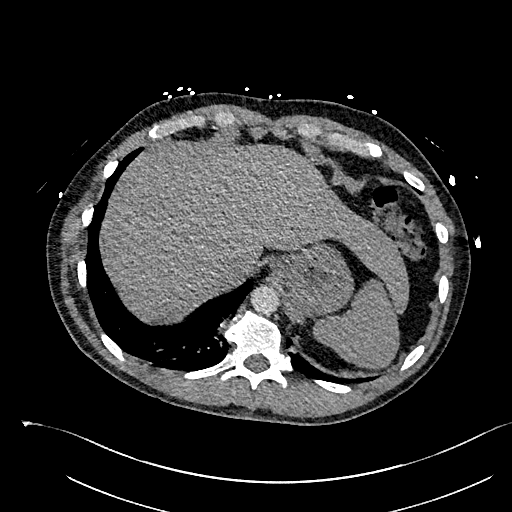
[im 84/300  lung]
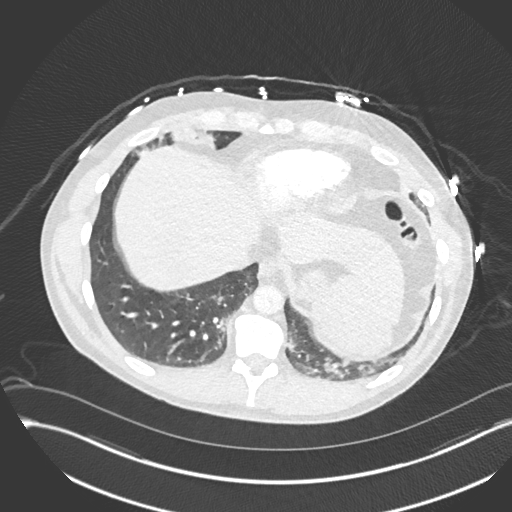
[im 100/300  mediastinal]
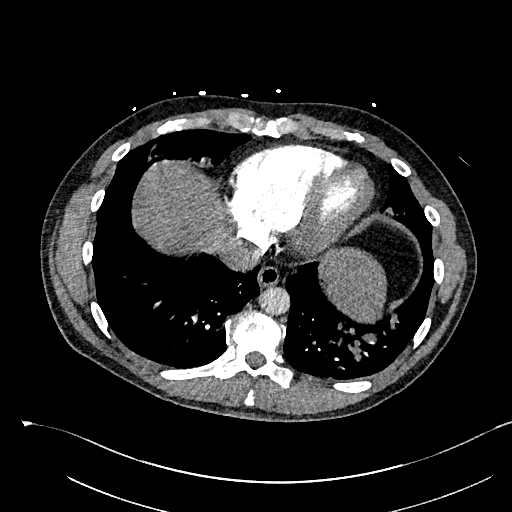
[im 117/300  lung]
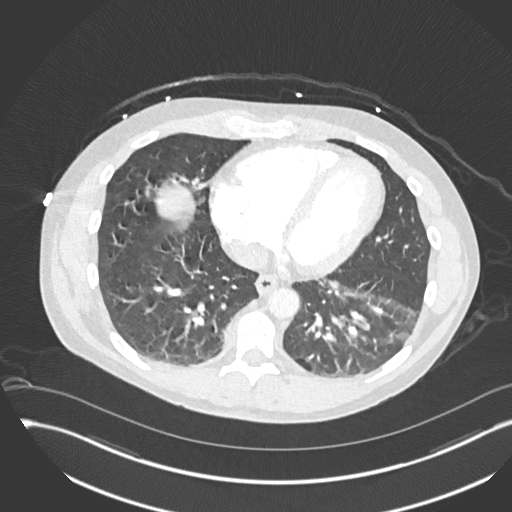
[im 133/300  mediastinal]
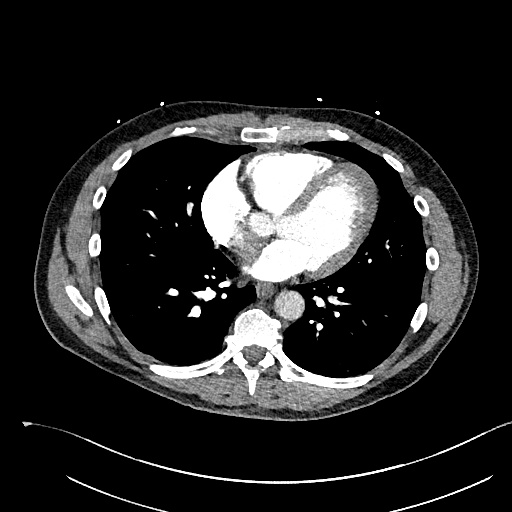
[im 150/300  lung]
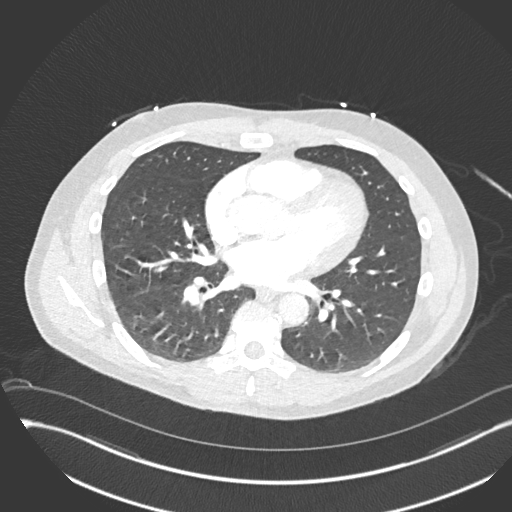
[im 167/300  mediastinal]
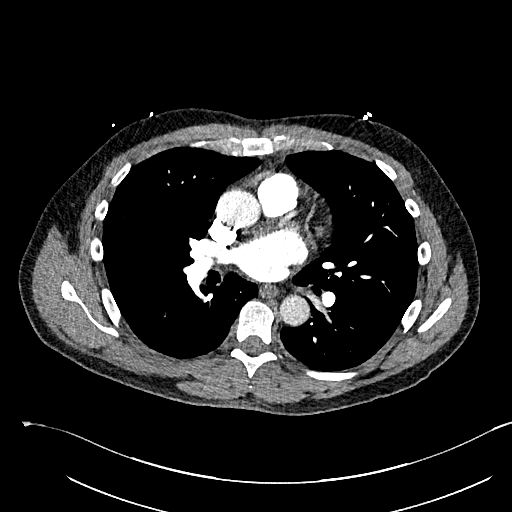
[im 183/300  lung]
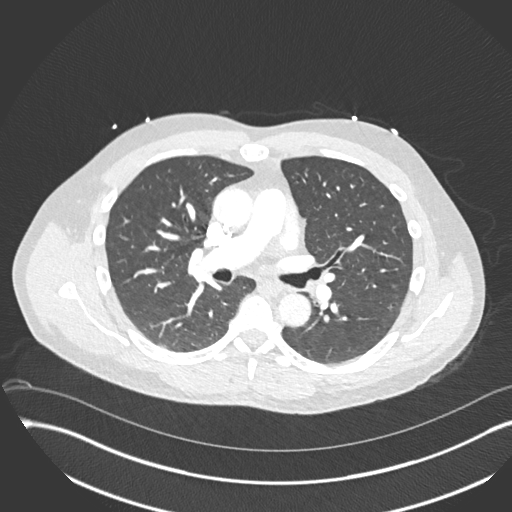
[im 200/300  mediastinal]
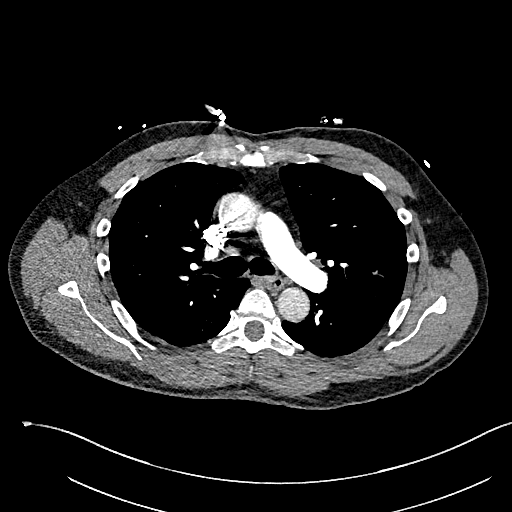
[im 216/300  lung]
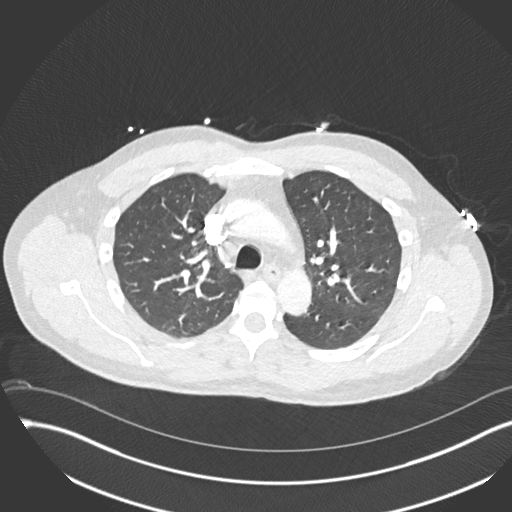
[im 233/300  mediastinal]
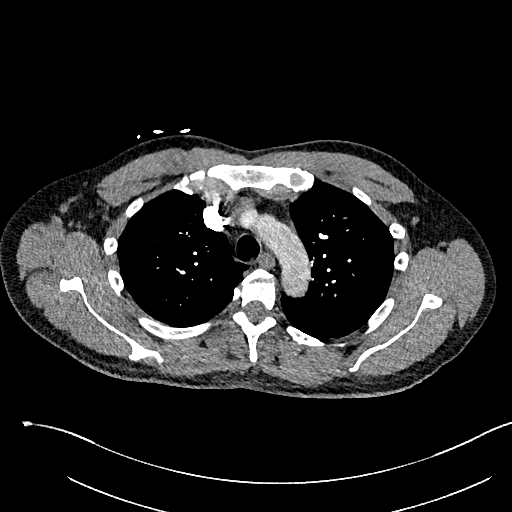
[im 250/300  lung]
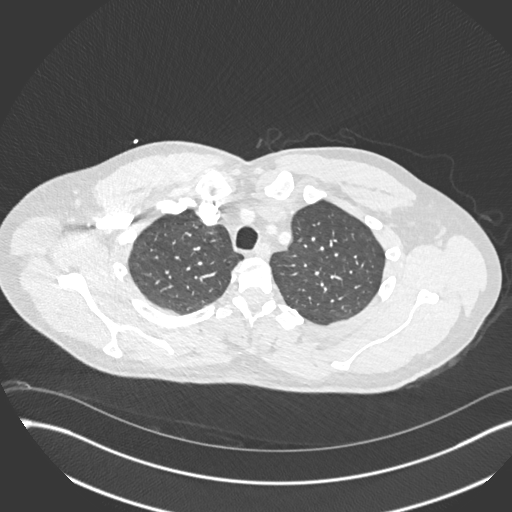
[im 266/300  mediastinal]
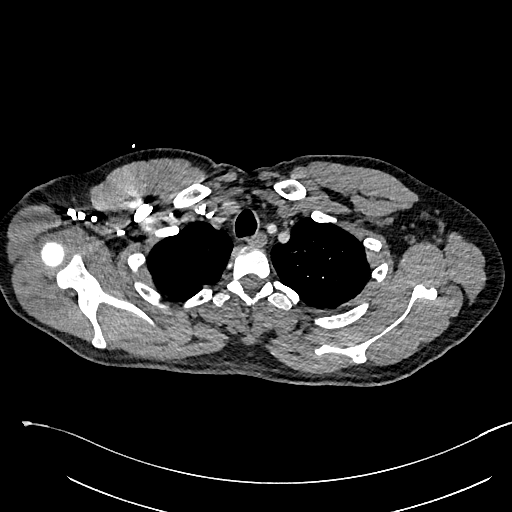
[im 283/300  lung]
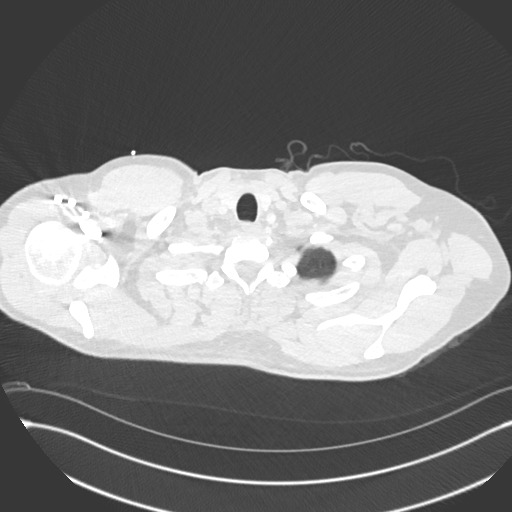

[Series 8: pe 2mm cor · coronal · 0.59mm/px · 1 of 151 slices shown]
[im 76/151  mediastinal]
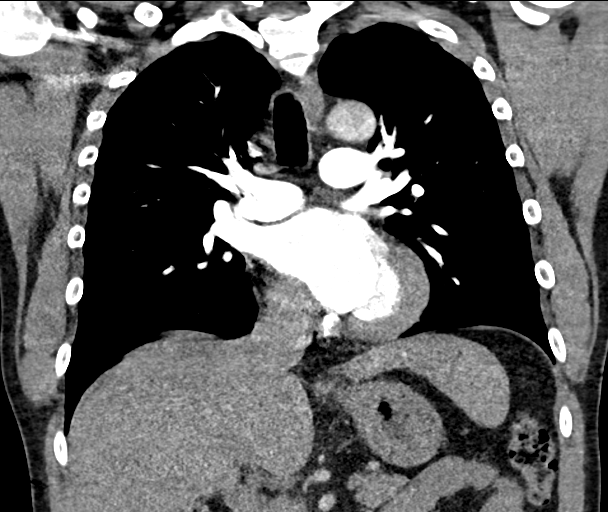

[18 of 36 positions shown; findings below may reference images not displayed]

RADIATION DOSE REDUCTION: This exam was performed according to the
departmental dose-optimization program which includes automated
exposure control, adjustment of the mA and/or kV according to
patient size and/or use of iterative reconstruction technique.

CONTRAST:  100mL OMNIPAQUE IOHEXOL 350 MG/ML SOLN
FINDINGS: Cardiovascular: Satisfactory opacification of the pulmonary arteries
to the segmental level. No evidence of pulmonary embolism. Normal
heart size. No pericardial effusion.

Mediastinum/Nodes: No enlarged mediastinal, hilar, or axillary lymph
nodes. Thyroid gland, trachea, and esophagus demonstrate no
significant findings.

Lungs/Pleura: Clustered heterogeneous and nodular opacity in the
dependent left lower lobe, lingula, and right middle lobe (series 6,
image 102). No pleural effusion or pneumothorax.

Upper Abdomen: No acute abnormality.

Musculoskeletal: No chest wall abnormality. No acute osseous
findings.

Review of the MIP images confirms the above findings.
IMPRESSION: 1. Negative examination for pulmonary embolism.
2. Clustered heterogeneous and nodular opacity in the dependent left
lower lobe, lingula, and right middle lobe, consistent with
infection or aspiration.

## 2023-09-05 IMAGING — CR DG CHEST 2V
2 series · 2 of 2 positions shown · non-contrast
Comparison: AP chest 11/13/2021 and 08/08/2021

CLINICAL DATA: Chest pain. Pneumonia re-evaluation. Fell on left
side a few days ago. Mid chest pain. Shortness of breath.

EXAM:
CHEST - 2 VIEW

[chest lat]
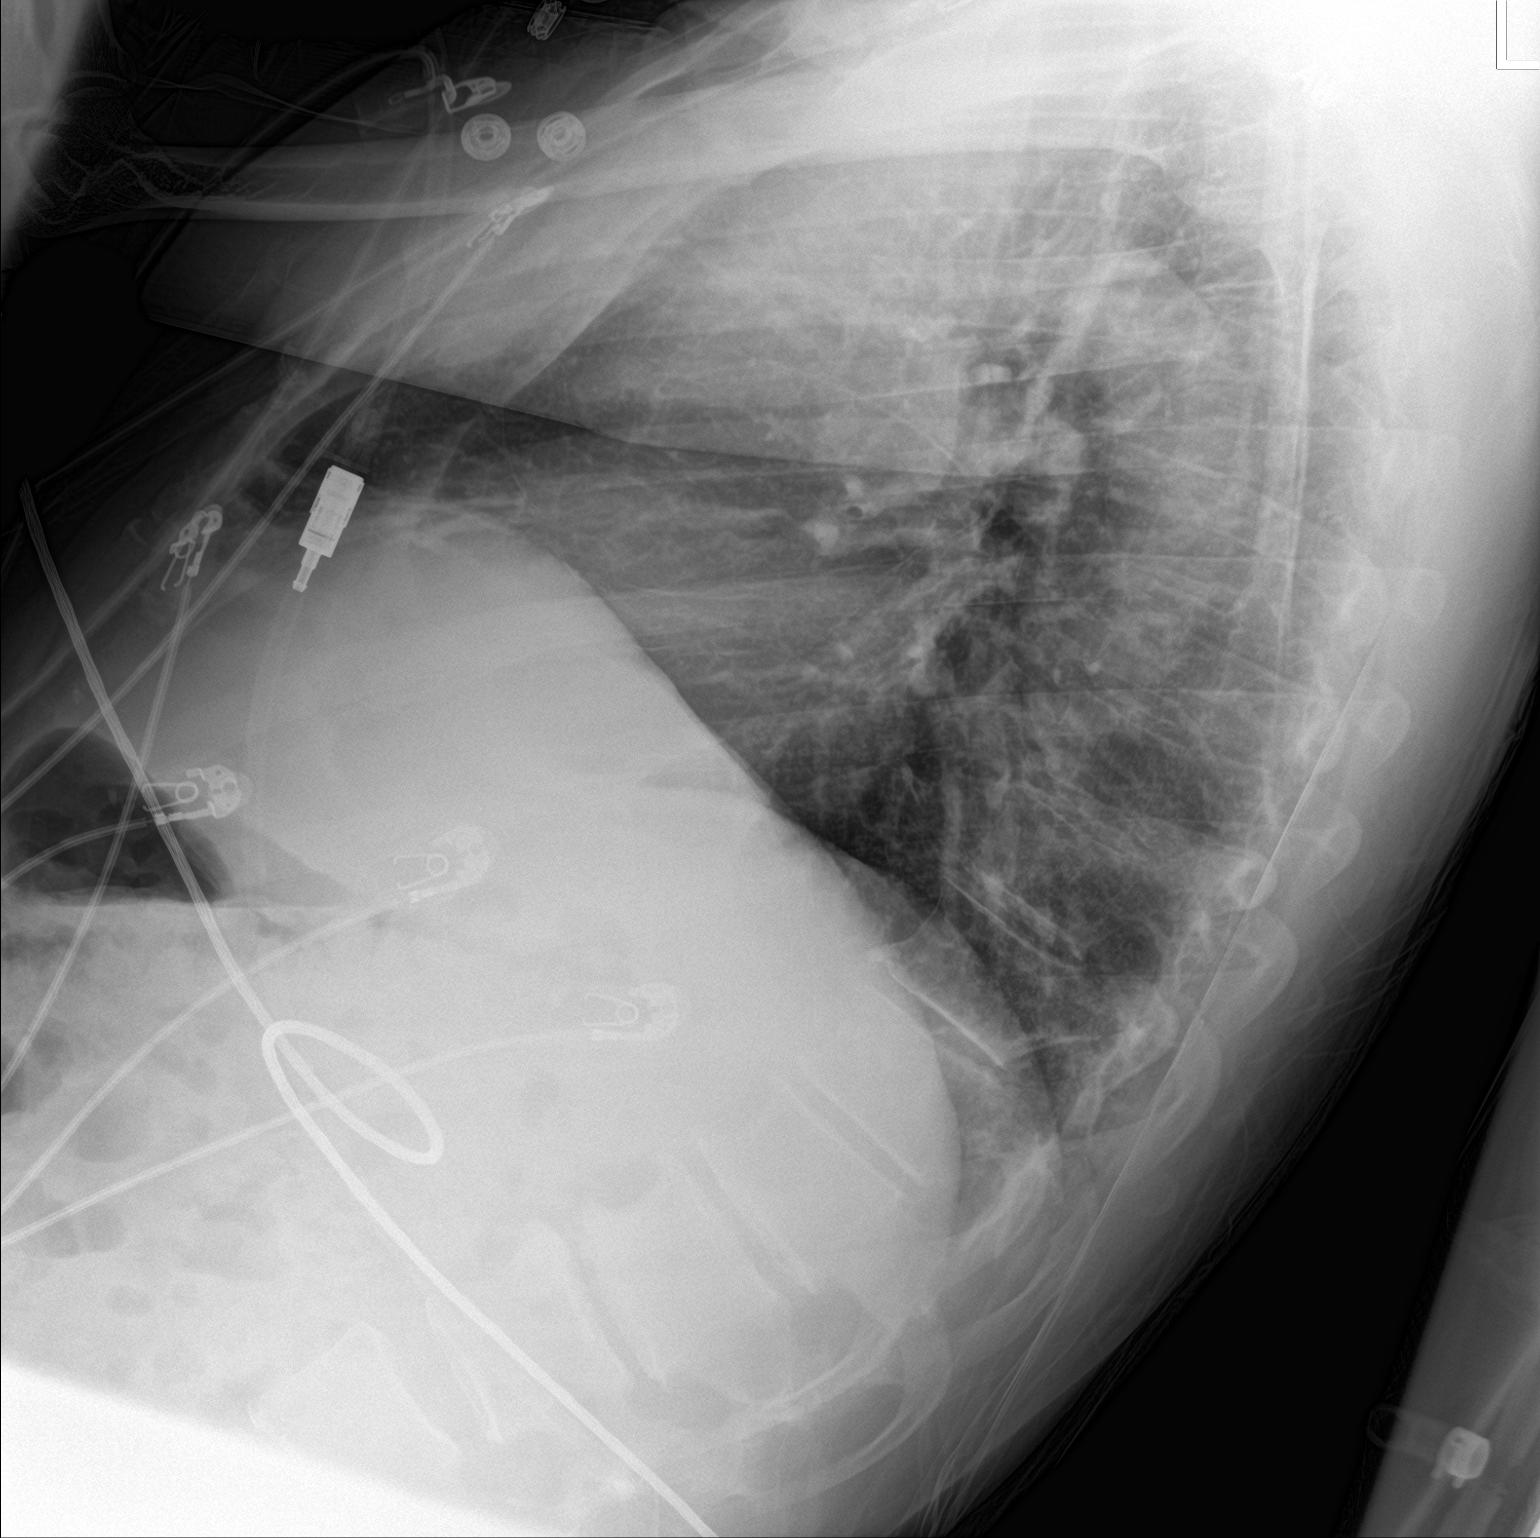

[chest ap]
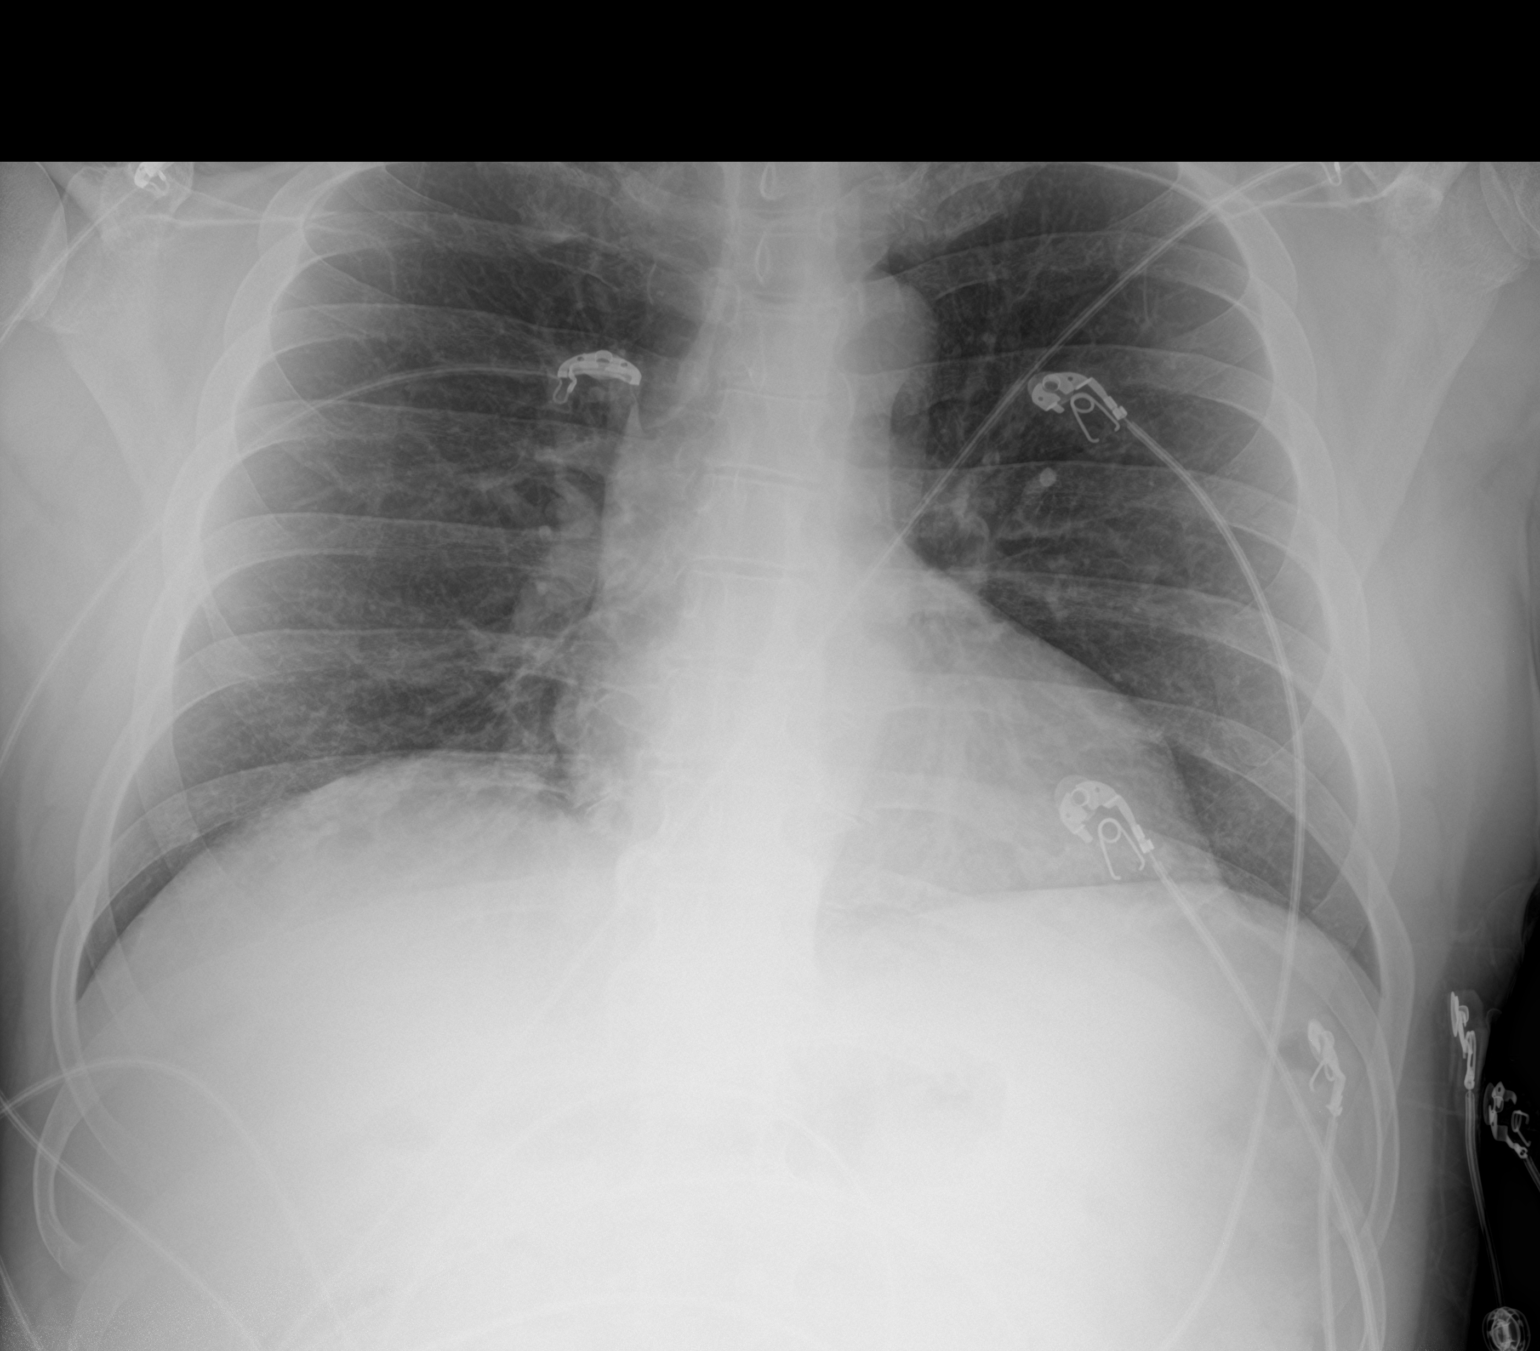

[2 of 2 positions shown; findings below may reference images not displayed]

FINDINGS: Improved aeration with resolution of the prior questioned bibasilar
heterogeneous opacities. No pleural effusion or pneumothorax. No
acute skeletal abnormality.
IMPRESSION: No acute cardiopulmonary disease process.

## 2023-09-05 NOTE — [facility] Suicide Risk Assessment (Signed)
 [facility] INPATIENT:  Family/Significant Other Suicide Prevention Education  Suicide Prevention Education:  Contact Attempts: Keagen Heinlen, [phone], Brother,  has been identified by the patient as the family member/significant other with whom the patient will be residing, and identified as the person(s) who will aid the patient in the event of a mental health crisis.  With written consent from the patient, two attempts were made to provide suicide prevention education, prior to and/or following the patient's discharge.  We were unsuccessful in providing suicide prevention education.  A suicide education pamphlet was given to the patient to share with family/significant other.  Date and time of first attempt:09/03/23 at 3:40 PM.  Date and time of second attempt:09/05/23 at 3:57 pm  Marshell Levan 09/05/2023, 3:57 PM

## 2023-09-05 NOTE — Group Note (Signed)
 Date:  09/05/2023 Time:  10:03 PM  Group Topic/Focus:  Wrap-Up Group:   The focus of this group is to help patients review their daily goal of treatment and discuss progress on daily workbooks.    Participation Level:  Active  Participation Quality:  Appropriate and Attentive  Affect:  Appropriate  Cognitive:  Alert and Appropriate  Insight: Appropriate  Engagement in Group:  Engaged  Modes of Intervention:  Discussion and Orientation  Additional Comments:     Maglione,Jo Booze E 09/05/2023, 10:03 PM

## 2023-09-05 NOTE — Progress Notes (Signed)
   09/04/23 2300  Psych Admission Type (Psych Patients Only)  Admission Status Voluntary  Psychosocial Assessment  Patient Complaints Anxiety;Depression  Eye Contact Fair  Facial Expression Animated  Affect Anxious  Speech Logical/coherent  Interaction Assertive  Motor Activity Restless  Appearance/Hygiene In scrubs  Behavior Characteristics Appropriate to situation;Cooperative  Mood Pleasant  Thought Process  Coherency WDL  Content WDL  Delusions None reported or observed  Perception Hallucinations  Hallucination Visual  Judgment Impaired  Confusion None  Danger to Self  Current suicidal ideation? Denies  Description of Suicide Plan none  Agreement Not to Harm Self Yes  Description of Agreement verbal  Danger to Others  Danger to Others None reported or observed

## 2023-09-05 NOTE — Group Note (Signed)
 LCSW Group Therapy Note   Group Date: 09/05/2023 Start Time: 1400 End Time: 1500   Type of Therapy and Topic:  Group Therapy: AA/NA Group  Participation Level:  Active  Description of Group: AA/NA Group  Summary of Patient Progress:    Patient attended group.   Harden Mo, LCSW 09/05/2023  3:19 PM

## 2023-09-05 NOTE — Plan of Care (Signed)
   Problem: Education: Goal: Verbalization of understanding the information provided will improve Outcome: Progressing

## 2023-09-05 NOTE — Progress Notes (Signed)
 [facility] MD Progress Note  09/05/2023 12:23 PM Raymond [name]  MRN:  [id number]   Raymond [name] is a 43 year-old male when presented to the emergency department  yesterday via EMS on 09/02/2023 after he reportedly took 70 pills of all his medication regimen.  He reports that he did that because he was feeling overwhelmed and wanting to kill himself. He also reports that he had been drinking alcohol increasingly to the point of losing awareness. Patient presented to the ED in intoxication state and his alcohol level was 283. His UDS showed cannabinoids in his system. Psych consult was ordered and  inpatient treatment was recommended.  Per chart review, patient was recently  evaluated at [facility] health Emergency Department on 08/30/2023 due to suicidal threats. Upon discharge, patient was recommended to follow up with PCP.  On 08/09/2023, patient was admitted to [facility] under Dr Sherron Flemings for worsening depression, anxiety and panic attacks along with suicidal ideations. He reported that he had overdosed on 90 pills of 500 mg Kepra and Trazodone. His medications were adjusted  and, upon discharge, patient was to follow up at [facility] and appointment was scheduled.  Today patient reports the same symptoms although he reports that he has community services . He reports that [name] from his ACT team has been coming to see him. Chart review indicates that patient has had multiple visits to the ED  in recent months, and many  resulting into inpatient hospitalizations. Patient reports that he also goes to the [place] for medication management. He reports having alcohol problem and requests treatment.    Subjective: Patient's case is discussed with multidisciplinary team, all vitals and notes were reviewed.  No reported behavioral issues overnight.  Patient reports that he was admitted to the unit because he overdosed on 80 pills of Keppra, Tylenol, all his other medications, that after he did this he went on to consume alcohol.   That he was walking on the street and ended up hitting his head and passed out.  States that his mother passed away about 3 years ago, and that it was the anniversary of her birthday.  States that he had been planning this suicide attempt 4 months now.  Does report a history of multiple suicide attempts, last was 2 months ago, confirmed via chart review.  States he has a history of seeing shadows, last was about 2 days ago.  Continues to endorse depressed mood she rates as a 7 out of 10, anxiety as a 4 out of 10.  Currently denying SI/HI and AVH.  Reports reduced appetite, only about 3 to 4 hours of sleep at night.  States that he no longer wants to die, that he wants to stay alive to see his brother graduate from [employer], as well as possibly find an intimate partner to have a child with.  He is med compliant.  According to social work team, patient does have day program enrollment, an ACT team, he is connected to the [place].  Nursing staff reports most recent CIWA score was a 4, received as needed Vistaril and Zofran.    Principal Problem: <principal problem not specified> Diagnosis: Active Problems:   MDD (major depressive disorder), recurrent episode, severe (HCC)   MDD (major depressive disorder)   Substance abuse (HCC)  Total Time spent with patient: 45 minutes  Past Psychiatric History:  Previous Psych Diagnoses: Major depressive disorder, recurrent severe without psychotic features, amphetamine abuse, cocaine abuse, substance-induced mood disorder, with suicide attempt.  Prior  inpatient treatment: Admitted to [facility] behavioral health in [place] in 2020 for suicide attempt for 1 month, most recently [facility] 08/2023 Current/prior outpatient treatment: ACT team  Prior rehab hx: Rehab for alcohol x 1 month in 2019 Psychotherapy hx: Yes History of suicide: multiple attempts History of homicide or aggression: Denies Psychiatric medication history: Thorazine, fluoxetine, hydroxyzine,  risperidone, and trazodone. Psychiatric medication compliance history: Noncompliance Current Psychiatrist: ACT team  Current therapist: Seeing Graciella Freer at the [facility]  Past Medical History:  Past Medical History:  Diagnosis Date   Alcohol abuse    Anxiety    Hepatitis C    PTSD (post-traumatic stress disorder)     Past Surgical History:  Procedure Laterality Date   FACIAL FRACTURE SURGERY     metal plate under right eye   Family History: History reviewed. No pertinent family history. Family Psychiatric  History: unknown  Social History:  Social History   Substance and Sexual Activity  Alcohol Use Yes   Comment: 1 liter bottle of vodka prior to arrival     Social History   Substance and Sexual Activity  Drug Use Yes   Types: Methamphetamines    Social History   Socioeconomic History   Marital status: Single    Spouse name: Not on file   Number of children: Not on file   Years of education: Not on file   Highest education level: Not on file  Occupational History   Not on file  Tobacco Use   Smoking status: Every Day    Current packs/day: 1.00    Types: Cigarettes    Passive exposure: Current   Smokeless tobacco: Never  Vaping Use   Vaping status: Never Used  Substance and Sexual Activity   Alcohol use: Yes    Comment: 1 liter bottle of vodka prior to arrival   Drug use: Yes    Types: Methamphetamines   Sexual activity: Not Currently  Other Topics Concern   Not on file  Social History Narrative   Not on file   Social Drivers of Health   Financial Resource Strain: Not on File (08/18/2022)   Received from [facility] [employer]    Financial Resource Strain: 0  Food Insecurity: No Food Insecurity (09/03/2023)   Hunger Vital Sign    Worried About Running Out of Food in the Last Year: Never true    Ran Out of Food in the Last Year: Never true  Transportation Needs: Unmet Transportation Needs (09/03/2023)   PRAPARE - Therapist, art (Medical): Yes    Lack of Transportation (Non-Medical): Yes  Physical Activity: Not on File (08/18/2022)   Received from [place], [place]   Physical Activity    Physical Activity: 0  Stress: Not on File (08/18/2022)   Received from [facility], [place]   Stress    Stress: 0  Social Connections: Not on File (02/15/2023)   Received from [facility]   Social Connections    Connectedness: 0   Additional Social History:                         Sleep: Poor  Appetite:  Fair  Current Medications: Current Facility-Administered Medications  Medication Dose Route Frequency Provider Last Rate Last Admin   alum & mag hydroxide-simeth (MAALOX/MYLANTA) 200-200-20 MG/5ML suspension 30 mL  30 mL Oral Q4H PRN Penn, Cicely, NP       chlordiazePOXIDE (LIBRIUM) capsule 25 mg  25  mg Oral Q6H PRN Marlou Sa, NP       FLUoxetine (PROZAC) capsule 60 mg  60 mg Oral Daily Marlou Sa, NP   60 mg at 09/05/23 1610   hydrOXYzine (ATARAX) tablet 25 mg  25 mg Oral Q6H PRN Marlou Sa, NP       levETIRAcetam (KEPPRA) tablet 1,000 mg  1,000 mg Oral BID Onuoha, Chinwendu V, NP   1,000 mg at 09/05/23 9604   loperamide (IMODIUM) capsule 2-4 mg  2-4 mg Oral PRN Marlou Sa, NP       multivitamin with minerals tablet 1 tablet  1 tablet Oral Daily Marlou Sa, NP   1 tablet at 09/05/23 5409   nicotine polacrilex (NICORETTE) gum 2 mg  2 mg Oral PRN Verner Chol, MD   2 mg at 09/04/23 2112   OLANZapine (ZYPREXA) injection 10 mg  10 mg Intramuscular TID PRN Mcneil Sober, NP       OLANZapine (ZYPREXA) injection 5 mg  5 mg Intramuscular TID PRN Mcneil Sober, NP       OLANZapine zydis (ZYPREXA) disintegrating tablet 5 mg  5 mg Oral TID PRN Mcneil Sober, NP   5 mg at 09/03/23 2200   ondansetron (ZOFRAN-ODT) disintegrating tablet 4 mg  4 mg Oral Q6H PRN Marlou Sa, NP       risperiDONE (RISPERDAL) tablet 2 mg  2 mg Oral QHS Rayburn Go, Veronique M,  NP   2 mg at 09/04/23 2111   thiamine (VITAMIN B1) tablet 100 mg  100 mg Oral Daily Verner Chol, MD   100 mg at 09/05/23 0842   traZODone (DESYREL) tablet 150 mg  150 mg Oral QHS Marlou Sa, NP   150 mg at 09/04/23 2111    Lab Results: No results found for this or any previous visit (from the past 48 hours).  Blood Alcohol level:  Lab Results  Component Value Date   ETH 283 (H) 09/02/2023   ETH 142 (H) 08/30/2023    Metabolic Disorder Labs: Lab Results  Component Value Date   HGBA1C 5.3 08/11/2023   MPG 105.41 08/11/2023   MPG 108.28 03/28/2023   Lab Results  Component Value Date   PROLACTIN 12.0 01/22/2023   PROLACTIN 19.5 11/20/2022   Lab Results  Component Value Date   CHOL 172 08/11/2023   TRIG 146 08/11/2023   HDL 53 08/11/2023   CHOLHDL 3.2 08/11/2023   VLDL 29 08/11/2023   LDLCALC 90 08/11/2023   LDLCALC 84 03/28/2023    Physical Findings: AIMS:  , ,  ,  ,    CIWA:  CIWA-Ar Total: 1 COWS:     Musculoskeletal: Strength & Muscle Tone: within normal limits Gait & Station: normal Patient leans: N/A  Psychiatric Specialty Exam:  Presentation  General Appearance:  Casual  Eye Contact: Minimal (diminished, avoiding)  Speech: Clear and Coherent  Speech Volume: Normal  Handedness: Right   Mood and Affect  Mood: Anxious; Depressed  Affect: Congruent   Thought Process  Thought Processes: Coherent  Descriptions of Associations:Intact  Orientation:Full (Time, Place and Person)  Thought Content:WDL  History of Schizophrenia/Schizoaffective disorder:No  Duration of Psychotic Symptoms:No data recorded Hallucinations:Hallucinations: Visual Description of Visual Hallucinations: I see shadows  Ideas of Reference:None  Suicidal Thoughts:Suicidal Thoughts: No  Homicidal Thoughts:Homicidal Thoughts: No   Sensorium  Memory: Immediate Fair; Recent Fair; Remote Fair  Judgment: Fair  Insight: Fair   Restaurant manager, fast food  Concentration: Fair  Attention Span: Fair  Recall: Jennelle Human of Knowledge: Fair  Language: Fair   Psychomotor Activity  Psychomotor Activity: Psychomotor Activity: Normal   Assets  Assets: Communication Skills; Desire for Improvement; Physical Health   Sleep  Sleep: Sleep: Poor Number of Hours of Sleep: 2    Physical Exam: Physical Exam Vitals and nursing note reviewed.  Constitutional:      Appearance: Normal appearance.  HENT:     Head: Normocephalic and atraumatic.  Eyes:     Extraocular Movements: Extraocular movements intact.  Pulmonary:     Effort: Pulmonary effort is normal.  Musculoskeletal:     Cervical back: Normal range of motion.  Skin:    General: Skin is dry.  Neurological:     Mental Status: He is alert and oriented to person, place, and time. Mental status is at baseline.  Psychiatric:        Attention and Perception: Attention and perception normal.        Mood and Affect: Mood is anxious and depressed. Affect is blunt.        Speech: Speech normal.        Behavior: Behavior normal. Behavior is cooperative.        Thought Content: Thought content normal.        Cognition and Memory: Cognition and memory normal.        Judgment: Judgment is impulsive.    Review of Systems  Psychiatric/Behavioral:  Positive for depression. The patient is nervous/anxious.   All other systems reviewed and are negative.  Blood pressure 91/74, pulse (!) 111, temperature 97.7 F (36.5 C), resp. rate 16, SpO2 98%. There is no height or weight on file to calculate BMI.   Treatment Plan Summary:  Safety and Monitoring:             -- Voluntary admission to inpatient psychiatric unit for safety, stabilization and treatment             -- Daily contact with patient to assess and evaluate symptoms and progress in treatment             -- Patient's case to be discussed in multi-disciplinary team meeting             -- Observation Level: q15  minute checks             -- Vital signs:  q12 hours             -- Precautions: suicide, elopement, and assault   2. Psychiatric Diagnoses and Treatment:               Continue Risperdal 2 mg HS for mood stabilization/VH Continue Prozac 60 mg daily for depressive sxs  Continue Trazodone 150 mg HS insomnia   -- The risks/benefits/side-effects/alternatives to this medication were discussed in detail with the patient and time was given for questions. The patient consents to medication trial.                -- Metabolic profile and EKG monitoring obtained while on an atypical antipsychotic (BMI:22.45 Lipid Panel:WNL HbgA1c:5.3 QTc:432)              -- Encouraged patient to participate in unit milieu and in scheduled group therapies                            3. Medical Issues Being Addressed:    Continue CIWA protocol, Librium taper  Hx of Seizures             -- Continue keppra 1000 mg BID   Nicotine dependence  -- Continue Nicotine gum 2 mg PRN  -- encouraged smoking cessation   4. Discharge Planning:              -- Social work and case management to assist with discharge planning and identification of hospital follow-up needs prior to discharge             -- Estimated LOS: 5-7 days             -- Discharge Concerns: Need to establish a safety plan; Medication compliance and effectiveness             -- Discharge Goals: Return home with outpatient referrals follow ups   Physician Treatment Plan for Primary Diagnosis: MDD (major depressive disorder), recurrent severe, without psychosis (HCC) Long Term Goal(s): Improvement in symptoms so as ready for discharge   Short Term Goals: Ability to identify changes in lifestyle to reduce recurrence of condition will improve, Ability to verbalize feelings will improve, Ability to disclose and discuss suicidal ideas, Ability to demonstrate self-control will improve, and Ability to identify and develop effective coping  behaviors will improve   Physician Treatment Plan for Secondary Diagnosis: Principal Problem:   MDD (major depressive disorder), recurrent severe, without psychosis (HCC)   Long Term Goal(s): Improvement in symptoms so as ready for discharge   Short Term Goals: Ability to identify changes in lifestyle to reduce recurrence of condition will improve, Ability to verbalize feelings will improve, Ability to disclose and discuss suicidal ideas, Ability to demonstrate self-control will improve, Ability to identify and develop effective coping behaviors will improve, and Ability to maintain clinical measurements within normal limits will improve  Beni Turrell, PA-C 09/05/2023, 12:23 PM

## 2023-09-05 NOTE — Group Note (Deleted)
 Date:  09/05/2023 Time:  9:53 PM  Group Topic/Focus:  Wrap-Up Group:   The focus of this group is to help patients review their daily goal of treatment and discuss progress on daily workbooks.     Participation Level:  {BHH PARTICIPATION WUJWJ:19147}  Participation Quality:  {BHH PARTICIPATION QUALITY:22265}  Affect:  {BHH AFFECT:22266}  Cognitive:  {BHH COGNITIVE:22267}  Insight: {BHH Insight2:20797}  Engagement in Group:  {BHH ENGAGEMENT IN WGNFA:21308}  Modes of Intervention:  {BHH MODES OF INTERVENTION:22269}  Additional Comments:  ***  Maglione,Angell Honse E 09/05/2023, 9:53 PM

## 2023-09-05 NOTE — Progress Notes (Signed)
   09/05/23 1900  Psych Admission Type (Psych Patients Only)  Admission Status Voluntary  Psychosocial Assessment  Patient Complaints Anxiety  Eye Contact Fair  Facial Expression Animated  Affect Anxious  Speech Logical/coherent  Interaction Assertive  Motor Activity Restless  Appearance/Hygiene In scrubs  Behavior Characteristics Cooperative  Mood Pleasant;Anxious  Thought Process  Coherency WDL  Content WDL  Delusions None reported or observed  Perception Hallucinations  Hallucination Visual  Judgment Impaired  Confusion None  Danger to Self  Current suicidal ideation? Passive  Description of Suicide Plan none  Self-Injurious Behavior No self-injurious ideation or behavior indicators observed or expressed   Agreement Not to Harm Self Yes  Danger to Others  Danger to Others None reported or observed

## 2023-09-06 DIAGNOSIS — F1910 Other psychoactive substance abuse, uncomplicated: Secondary | ICD-10-CM

## 2023-09-06 LAB — LACTIC ACID, PLASMA: Lactic Acid, Venous: 1.3 mmol/L (ref 0.5–1.9)

## 2023-09-06 NOTE — Plan of Care (Signed)
   Problem: Education: Goal: Emotional status will improve Outcome: Progressing   Problem: Education: Goal: Mental status will improve Outcome: Progressing

## 2023-09-06 NOTE — Progress Notes (Incomplete)
 [facility] MD Progress Note  09/06/2023 2:51 PM Raymond [name]  MRN:  [id number]   Raymond [name] is a 43 year-old male when presented to the emergency department  yesterday via EMS on 09/02/2023 after he reportedly took 70 pills of all his medication regimen.  He reports that he did that because he was feeling overwhelmed and wanting to kill himself. He also reports that he had been drinking alcohol increasingly to the point of losing awareness. Patient presented to the ED in intoxication state and his alcohol level was 283. His UDS showed cannabinoids in his system. Psych consult was ordered and  inpatient treatment was recommended.  Per chart review, patient was recently  evaluated at [facility] health Emergency Department on 08/30/2023 due to suicidal threats. Upon discharge, patient was recommended to follow up with PCP.  On 08/09/2023, patient was admitted to [facility] under Dr Sherron Flemings for worsening depression, anxiety and panic attacks along with suicidal ideations. He reported that he had overdosed on 90 pills of 500 mg Kepra and Trazodone. His medications were adjusted  and, upon discharge, patient was to follow up at [facility] and appointment was scheduled.  Today patient reports the same symptoms although he reports that he has community services . He reports that [name] from his ACT team has been coming to see him. Chart review indicates that patient has had multiple visits to the ED  in recent months, and many  resulting into inpatient hospitalizations. Patient reports that he also goes to the [place] for medication management. He reports having alcohol problem and requests treatment.    Subjective: Patient's case is discussed with multidisciplinary team, all vitals and notes were reviewed.  No reported behavioral issues overnight.  Patient reports that he was admitted to the unit because he overdosed on 80 pills of Keppra, Tylenol, all his other medications, that after he did this he went on to consume alcohol.   That he was walking on the street and ended up hitting his head and passed out.  States that his mother passed away about 3 years ago, and that it was the anniversary of her birthday.  States that he had been planning this suicide attempt 4 months now.  Does report a history of multiple suicide attempts, last was 2 months ago, confirmed via chart review.  States he has a history of seeing shadows, last was about 2 days ago.  Continues to endorse depressed mood she rates as a 7 out of 10, anxiety as a 4 out of 10.  Currently denying SI/HI and AVH.  Reports reduced appetite, only about 3 to 4 hours of sleep at night.  States that he no longer wants to die, that he wants to stay alive to see his brother graduate from [employer], as well as possibly find an intimate partner to have a child with.  He is med compliant.  According to social work team, patient does have day program enrollment, an ACT team, he is connected to the [place].  Nursing staff reports most recent CIWA score was a 4, received as needed Vistaril and Zofran.    Principal Problem: <principal problem not specified> Diagnosis: Active Problems:   MDD (major depressive disorder), recurrent episode, severe (HCC)   MDD (major depressive disorder)   Substance abuse (HCC)  Total Time spent with patient: 45 minutes  Past Psychiatric History:  Previous Psych Diagnoses: Major depressive disorder, recurrent severe without psychotic features, amphetamine abuse, cocaine abuse, substance-induced mood disorder, with suicide attempt.  Prior  inpatient treatment: Admitted to [facility] behavioral [facility] in [place] in 2020 for suicide attempt for 1 month, most recently [facility] 08/2023 Current/prior outpatient treatment: ACT team  Prior rehab hx: Rehab for alcohol x 1 month in 2019 Psychotherapy hx: Yes History of suicide: multiple attempts History of homicide or aggression: Denies Psychiatric medication history: Thorazine, fluoxetine, hydroxyzine,  risperidone, and trazodone. Psychiatric medication compliance history: Noncompliance Current Psychiatrist: ACT team  Current therapist: Seeing Graciella Freer at the [facility]  Past Medical History:  Past Medical History:  Diagnosis Date   Alcohol abuse    Anxiety    Hepatitis C    PTSD (post-traumatic stress disorder)     Past Surgical History:  Procedure Laterality Date   FACIAL FRACTURE SURGERY     metal plate under right eye   Family History: History reviewed. No pertinent family history. Family Psychiatric  History: unknown  Social History:  Social History   Substance and Sexual Activity  Alcohol Use Yes   Comment: 1 liter bottle of vodka prior to arrival     Social History   Substance and Sexual Activity  Drug Use Yes   Types: Methamphetamines    Social History   Socioeconomic History   Marital status: Single    Spouse name: Not on file   Number of children: Not on file   Years of education: Not on file   Highest education level: Not on file  Occupational History   Not on file  Tobacco Use   Smoking status: Every Day    Current packs/day: 1.00    Types: Cigarettes    Passive exposure: Current   Smokeless tobacco: Never  Vaping Use   Vaping status: Never Used  Substance and Sexual Activity   Alcohol use: Yes    Comment: 1 liter bottle of vodka prior to arrival   Drug use: Yes    Types: Methamphetamines   Sexual activity: Not Currently  Other Topics Concern   Not on file  Social History Narrative   Not on file   Social Drivers of Health   Financial Resource Strain: Not on File (08/18/2022)   Received from [facility] [employer] [place]    Financial Resource Strain: 0  Food Insecurity: No Food Insecurity (09/03/2023)   Hunger Vital Sign    Worried About Running Out of Food in the Last Year: Never true    Ran Out of Food in the Last Year: Never true  Transportation Needs: Unmet Transportation Needs (09/03/2023)   PRAPARE - Therapist, art (Medical): Yes    Lack of Transportation (Non-Medical): Yes  Physical Activity: Not on File (08/18/2022)   Received from [place], [place]   Physical Activity    Physical Activity: 0  Stress: Not on File (08/18/2022)   Received from [facility], [place]   Stress    Stress: 0  Social Connections: Not on File (02/15/2023)   Received from [facility]   Social Connections    Connectedness: 0   Additional Social History:                         Sleep: Poor  Appetite:  Fair  Current Medications: Current Facility-Administered Medications  Medication Dose Route Frequency Provider Last Rate Last Admin   alum & mag hydroxide-simeth (MAALOX/MYLANTA) 200-200-20 MG/5ML suspension 30 mL  30 mL Oral Q4H PRN Penn, Cicely, NP       chlordiazePOXIDE (LIBRIUM) capsule 25 mg  25  mg Oral Q6H PRN Marlou Sa, NP       FLUoxetine (PROZAC) capsule 60 mg  60 mg Oral Daily Rayburn Go, Veronique M, NP   60 mg at 09/06/23 1000   hydrOXYzine (ATARAX) tablet 25 mg  25 mg Oral Q6H PRN Marlou Sa, NP   25 mg at 09/05/23 1403   levETIRAcetam (KEPPRA) tablet 1,000 mg  1,000 mg Oral BID Onuoha, Chinwendu V, NP   1,000 mg at 09/06/23 1000   loperamide (IMODIUM) capsule 2-4 mg  2-4 mg Oral PRN Marlou Sa, NP       multivitamin with minerals tablet 1 tablet  1 tablet Oral Daily Rayburn Go, Veronique M, NP   1 tablet at 09/06/23 1000   nicotine polacrilex (NICORETTE) gum 2 mg  2 mg Oral PRN Verner Chol, MD   2 mg at 09/05/23 1406   OLANZapine (ZYPREXA) injection 10 mg  10 mg Intramuscular TID PRN Mcneil Sober, NP       OLANZapine (ZYPREXA) injection 5 mg  5 mg Intramuscular TID PRN Mcneil Sober, NP       OLANZapine zydis (ZYPREXA) disintegrating tablet 5 mg  5 mg Oral TID PRN Mcneil Sober, NP   5 mg at 09/05/23 1811   ondansetron (ZOFRAN-ODT) disintegrating tablet 4 mg  4 mg Oral Q6H PRN Marlou Sa, NP   4 mg at 09/05/23 1404   risperiDONE (RISPERDAL) tablet 2  mg  2 mg Oral QHS Rayburn Go, Veronique M, NP   2 mg at 09/05/23 2149   thiamine (VITAMIN B1) tablet 100 mg  100 mg Oral Daily Verner Chol, MD   100 mg at 09/06/23 1000   traZODone (DESYREL) tablet 150 mg  150 mg Oral QHS Marlou Sa, NP   150 mg at 09/05/23 2149    Lab Results: No results found for this or any previous visit (from the past 48 hours).  Blood Alcohol level:  Lab Results  Component Value Date   ETH 283 (H) 09/02/2023   ETH 142 (H) 08/30/2023    Metabolic Disorder Labs: Lab Results  Component Value Date   HGBA1C 5.3 08/11/2023   MPG 105.41 08/11/2023   MPG 108.28 03/28/2023   Lab Results  Component Value Date   PROLACTIN 12.0 01/22/2023   PROLACTIN 19.5 11/20/2022   Lab Results  Component Value Date   CHOL 172 08/11/2023   TRIG 146 08/11/2023   HDL 53 08/11/2023   CHOLHDL 3.2 08/11/2023   VLDL 29 08/11/2023   LDLCALC 90 08/11/2023   LDLCALC 84 03/28/2023    Physical Findings: AIMS:  , ,  ,  ,    CIWA:  CIWA-Ar Total: 2 COWS:     Musculoskeletal: Strength & Muscle Tone: within normal limits Gait & Station: normal Patient leans: N/A  Psychiatric Specialty Exam:  Presentation  General Appearance:  Casual  Eye Contact: Minimal (diminished, avoiding)  Speech: Clear and Coherent  Speech Volume: Normal  Handedness: Right   Mood and Affect  Mood: Anxious; Depressed  Affect: Congruent   Thought Process  Thought Processes: Coherent  Descriptions of Associations:Intact  Orientation:Full (Time, Place and Person)  Thought Content:WDL  History of Schizophrenia/Schizoaffective disorder:No  Duration of Psychotic Symptoms:No data recorded Hallucinations:No data recorded  Ideas of Reference:None  Suicidal Thoughts:No data recorded  Homicidal Thoughts:No data recorded   Sensorium  Memory: Immediate Fair; Recent Fair; Remote Fair  Judgment: Fair  Insight: Fair   Art therapist   Concentration: Fair  Attention Span: Fair  Recall: Jennelle Human of Knowledge: Fair  Language: Fair   Psychomotor Activity  Psychomotor Activity: No data recorded   Assets  Assets: Communication Skills; Desire for Improvement; Physical Health   Sleep  Sleep: No data recorded    Physical Exam: Physical Exam Vitals and nursing note reviewed.  Constitutional:      Appearance: Normal appearance.  HENT:     Head: Normocephalic and atraumatic.  Eyes:     Extraocular Movements: Extraocular movements intact.  Pulmonary:     Effort: Pulmonary effort is normal.  Musculoskeletal:     Cervical back: Normal range of motion.  Skin:    General: Skin is dry.  Neurological:     Mental Status: He is alert and oriented to person, place, and time. Mental status is at baseline.  Psychiatric:        Attention and Perception: Attention and perception normal.        Mood and Affect: Mood is anxious and depressed. Affect is blunt.        Speech: Speech normal.        Behavior: Behavior normal. Behavior is cooperative.        Thought Content: Thought content normal.        Cognition and Memory: Cognition and memory normal.        Judgment: Judgment is impulsive.    Review of Systems  Psychiatric/Behavioral:  Positive for depression. The patient is nervous/anxious.   All other systems reviewed and are negative.  Blood pressure (!) 119/95, pulse 72, temperature (!) 97.5 F (36.4 C), resp. rate 16, SpO2 97%. There is no height or weight on file to calculate BMI.   Treatment Plan Summary:  Safety and Monitoring:             -- Voluntary admission to inpatient psychiatric unit for safety, stabilization and treatment             -- Daily contact with patient to assess and evaluate symptoms and progress in treatment             -- Patient's case to be discussed in multi-disciplinary team meeting             -- Observation Level: q15 minute checks             -- Vital signs:   q12 hours             -- Precautions: suicide, elopement, and assault   2. Psychiatric Diagnoses and Treatment:               Continue Risperdal 2 mg HS for mood stabilization/VH Continue Prozac 60 mg daily for depressive sxs  Continue Trazodone 150 mg HS insomnia   -- The risks/benefits/side-effects/alternatives to this medication were discussed in detail with the patient and time was given for questions. The patient consents to medication trial.                -- Metabolic profile and EKG monitoring obtained while on an atypical antipsychotic (BMI:22.45 Lipid Panel:WNL HbgA1c:5.3 QTc:432)              -- Encouraged patient to participate in unit milieu and in scheduled group therapies                            3. Medical Issues Being Addressed:    Continue CIWA protocol, Librium taper  Hx of Seizures             -- Continue keppra 1000 mg BID   Nicotine dependence  -- Continue Nicotine gum 2 mg PRN  -- encouraged smoking cessation   4. Discharge Planning:              -- Social work and case management to assist with discharge planning and identification of hospital follow-up needs prior to discharge             -- Estimated LOS: 5-7 days             -- Discharge Concerns: Need to establish a safety plan; Medication compliance and effectiveness             -- Discharge Goals: Return home with outpatient referrals follow ups   Physician Treatment Plan for Primary Diagnosis: MDD (major depressive disorder), recurrent severe, without psychosis (HCC) Long Term Goal(s): Improvement in symptoms so as ready for discharge   Short Term Goals: Ability to identify changes in lifestyle to reduce recurrence of condition will improve, Ability to verbalize feelings will improve, Ability to disclose and discuss suicidal ideas, Ability to demonstrate self-control will improve, and Ability to identify and develop effective coping behaviors will improve   Physician  Treatment Plan for Secondary Diagnosis: Principal Problem:   MDD (major depressive disorder), recurrent severe, without psychosis (HCC)   Long Term Goal(s): Improvement in symptoms so as ready for discharge   Short Term Goals: Ability to identify changes in lifestyle to reduce recurrence of condition will improve, Ability to verbalize feelings will improve, Ability to disclose and discuss suicidal ideas, Ability to demonstrate self-control will improve, Ability to identify and develop effective coping behaviors will improve, and Ability to maintain clinical measurements within normal limits will improve  Sanjuana Mae, MD 09/06/2023, 2:51 PM

## 2023-09-06 NOTE — Group Note (Signed)
 Date:  09/06/2023 Time:  11:05 PM  Group Topic/Focus:  Wrap-Up Group:   The focus of this group is to help patients review their daily goal of treatment and discuss progress on daily workbooks.    Participation Level:  Active  Participation Quality:  Appropriate and Attentive  Affect:  Excited  Cognitive:  Alert and Appropriate  Insight: Appropriate  Engagement in Group:  Engaged  Modes of Intervention:  Education, Orientation, and Support  Additional Comments:     Maglione,Akshaj Besancon E 09/06/2023, 11:05 PM

## 2023-09-06 NOTE — Group Note (Signed)
 Date:  09/06/2023 Time:  5:41 PM  Group Topic/Focus:  Activity Group: The focus of the group is to promote activity for the patients and encourage them to go outside in the courtyard and get some fresh air and some exercise.    Participation Level:  Active  Participation Quality:  Appropriate  Affect:  Appropriate  Cognitive:  Appropriate  Insight: Appropriate  Engagement in Group:  Engaged  Modes of Intervention:  Activity  Additional Comments:    Raymond [name] 09/06/2023, 5:41 PM

## 2023-09-06 NOTE — Progress Notes (Signed)
 [facility] MD Progress Note  09/06/2023 10:39 PM Raymond [name]  MRN:  [id number]   Raymond [name] is a 43 year-old male when presented to the emergency department  yesterday via EMS on 09/02/2023 after he reportedly took 70 pills of all his medication regimen.  He reports that he did that because he was feeling overwhelmed and wanting to kill himself. He also reports that he had been drinking alcohol increasingly to the point of losing awareness. Patient presented to the ED in intoxication state and his alcohol level was 283. His UDS showed cannabinoids in his system. Psych consult was ordered and  inpatient treatment was recommended.  Per chart review, patient was recently  evaluated at [facility] health Emergency Department on 08/30/2023 due to suicidal threats. Upon discharge, patient was recommended to follow up with PCP.  On 08/09/2023, patient was admitted to [facility] under Dr Sherron Flemings for worsening depression, anxiety and panic attacks along with suicidal ideations. He reported that he had overdosed on 90 pills of 500 mg Kepra and Trazodone. His medications were adjusted  and, upon discharge, patient was to follow up at [facility] and appointment was scheduled.  Today patient reports the same symptoms although he reports that he has community services . He reports that [name] from his ACT team has been coming to see him. Chart review indicates that patient has had multiple visits to the ED  in recent months, and many  resulting into inpatient hospitalizations. Patient reports that he also goes to the [place] for medication management. He reports having alcohol problem and requests treatment.    Subjective: Patient's case is discussed with multidisciplinary team, all vitals and notes were reviewed.  No reported behavioral issues overnight. He is alert and oriented x4, calm and cooperative, appears depressed. Patient continues to endorse SI and depression. He reports that he had an unwitnessed seizure over night and had  incontinence, went unconscious. He denies biting his tongue, but states he ":blacked out" and could not talk or move. He states he was confused for a few hours afterwards.Staff reports patient still appears significantly depressed ,slightly less sad today compared to yesterday, CIWA : According to social work team, patient does have day program enrollment, an ACT team, he is connected to the [place].      Principal Problem: <principal problem not specified> Diagnosis: Active Problems:   MDD (major depressive disorder), recurrent episode, severe (HCC)   MDD (major depressive disorder)   Substance abuse (HCC)  Total Time spent with patient: 45 minutes  Past Psychiatric History:  Previous Psych Diagnoses: Major depressive disorder, recurrent severe without psychotic features, amphetamine abuse, cocaine abuse, substance-induced mood disorder, with suicide attempt.  Prior inpatient treatment: Admitted to [facility] behavioral health in [place] in 2020 for suicide attempt for 1 month, most recently [facility] 08/2023 Current/prior outpatient treatment: ACT team  Prior rehab hx: Rehab for alcohol x 1 month in 2019 Psychotherapy hx: Yes History of suicide: multiple attempts History of homicide or aggression: Denies Psychiatric medication history: Thorazine, fluoxetine, hydroxyzine, risperidone, and trazodone. Psychiatric medication compliance history: Noncompliance Current Psychiatrist: ACT team  Current therapist: Seeing Graciella Freer at the [facility]  Past Medical History:  Past Medical History:  Diagnosis Date   Alcohol abuse    Anxiety    Hepatitis C    PTSD (post-traumatic stress disorder)     Past Surgical History:  Procedure Laterality Date   FACIAL FRACTURE SURGERY     metal plate under right eye   Family History: History reviewed.  No pertinent family history. Family Psychiatric  History: unknown  Social History:  Social History   Substance and Sexual Activity  Alcohol Use  Yes   Comment: 1 liter bottle of vodka prior to arrival     Social History   Substance and Sexual Activity  Drug Use Yes   Types: Methamphetamines    Social History   Socioeconomic History   Marital status: Single    Spouse name: Not on file   Number of children: Not on file   Years of education: Not on file   Highest education level: Not on file  Occupational History   Not on file  Tobacco Use   Smoking status: Every Day    Current packs/day: 1.00    Types: Cigarettes    Passive exposure: Current   Smokeless tobacco: Never  Vaping Use   Vaping status: Never Used  Substance and Sexual Activity   Alcohol use: Yes    Comment: 1 liter bottle of vodka prior to arrival   Drug use: Yes    Types: Methamphetamines   Sexual activity: Not Currently  Other Topics Concern   Not on file  Social History Narrative   Not on file   Social Drivers of Health   Financial Resource Strain: Not on File (08/18/2022)   Received from [employer] [employer]    Financial Resource Strain: 0  Food Insecurity: No Food Insecurity (09/03/2023)   Hunger Vital Sign    Worried About Running Out of Food in the Last Year: Never true    Ran Out of Food in the Last Year: Never true  Transportation Needs: Unmet Transportation Needs (09/03/2023)   PRAPARE - Administrator, Civil Service (Medical): Yes    Lack of Transportation (Non-Medical): Yes  Physical Activity: Not on File (08/18/2022)   Received from [place], [place]   Physical Activity    Physical Activity: 0  Stress: Not on File (08/18/2022)   Received from [place], [place]   Stress    Stress: 0  Social Connections: Not on File (02/15/2023)   Received from [employer]   Social Connections    Connectedness: 0   Additional Social History:                         Sleep: Poor  Appetite:  Fair  Current Medications: Current Facility-Administered Medications  Medication Dose Route Frequency Provider Last Rate Last  Admin   alum & mag hydroxide-simeth (MAALOX/MYLANTA) 200-200-20 MG/5ML suspension 30 mL  30 mL Oral Q4H PRN Penn, Cicely, NP       chlordiazePOXIDE (LIBRIUM) capsule 25 mg  25 mg Oral Q6H PRN Rayburn Go, Veronique M, NP       FLUoxetine (PROZAC) capsule 60 mg  60 mg Oral Daily Byungura, Veronique M, NP   60 mg at 09/06/23 1000   hydrOXYzine (ATARAX) tablet 25 mg  25 mg Oral Q6H PRN Marlou Sa, NP   25 mg at 09/05/23 1403   levETIRAcetam (KEPPRA) tablet 1,000 mg  1,000 mg Oral BID Onuoha, Chinwendu V, NP   1,000 mg at 09/06/23 1813   loperamide (IMODIUM) capsule 2-4 mg  2-4 mg Oral PRN Marlou Sa, NP       multivitamin with minerals tablet 1 tablet  1 tablet Oral Daily Rayburn Go, Veronique M, NP   1 tablet at 09/06/23 1000   nicotine polacrilex (NICORETTE) gum 2 mg  2 mg Oral PRN Verner Chol, MD  2 mg at 09/05/23 1406   OLANZapine (ZYPREXA) injection 10 mg  10 mg Intramuscular TID PRN Mcneil Sober, NP       OLANZapine (ZYPREXA) injection 5 mg  5 mg Intramuscular TID PRN Mcneil Sober, NP       OLANZapine zydis (ZYPREXA) disintegrating tablet 5 mg  5 mg Oral TID PRN Mcneil Sober, NP   5 mg at 09/05/23 1811   ondansetron (ZOFRAN-ODT) disintegrating tablet 4 mg  4 mg Oral Q6H PRN Marlou Sa, NP   4 mg at 09/05/23 1404   risperiDONE (RISPERDAL) tablet 2 mg  2 mg Oral QHS Olin Pia M, NP   2 mg at 09/06/23 2133   thiamine (VITAMIN B1) tablet 100 mg  100 mg Oral Daily Verner Chol, MD   100 mg at 09/06/23 1000   traZODone (DESYREL) tablet 150 mg  150 mg Oral QHS Marlou Sa, NP   150 mg at 09/06/23 2133    Lab Results:  Results for orders placed or performed during the hospital encounter of 09/03/23 (from the past 48 hours)  Lactic acid, plasma     Status: None   Collection Time: 09/06/23  3:54 PM  Result Value Ref Range   Lactic Acid, Venous 1.3 0.5 - 1.9 mmol/L    Comment: Performed at [facility], [address]  [place], [place] [address]    Blood Alcohol level:  Lab Results  Component Value Date   ETH 283 (H) 09/02/2023   ETH 142 (H) 08/30/2023    Metabolic Disorder Labs: Lab Results  Component Value Date   HGBA1C 5.3 08/11/2023   MPG 105.41 08/11/2023   MPG 108.28 03/28/2023   Lab Results  Component Value Date   PROLACTIN 12.0 01/22/2023   PROLACTIN 19.5 11/20/2022   Lab Results  Component Value Date   CHOL 172 08/11/2023   TRIG 146 08/11/2023   HDL 53 08/11/2023   CHOLHDL 3.2 08/11/2023   VLDL 29 08/11/2023   LDLCALC 90 08/11/2023   LDLCALC 84 03/28/2023    Physical Findings: AIMS:  , ,  ,  ,    CIWA:  CIWA-Ar Total: 0 COWS:     Musculoskeletal: Strength & Muscle Tone: within normal limits Gait & Station: normal Patient leans: N/A  Psychiatric Specialty Exam:  Presentation  General Appearance:  Casual  Eye Contact: Minimal (diminished, avoiding)  Speech: Clear and Coherent  Speech Volume: Normal  Handedness: Right   Mood and Affect  Mood: Anxious; Depressed  Affect: Congruent   Thought Process  Thought Processes: Coherent  Descriptions of Associations:Intact  Orientation:Full (Time, Place and Person)  Thought Content:WDL  History of Schizophrenia/Schizoaffective disorder:No  Duration of Psychotic Symptoms:No data recorded Hallucinations:No data recorded  Ideas of Reference:None  Suicidal Thoughts:No data recorded  Homicidal Thoughts:No data recorded   Sensorium  Memory: Immediate Fair; Recent Fair; Remote Fair  Judgment: Fair  Insight: Fair   Art therapist  Concentration: Fair  Attention Span: Fair  Recall: [employer] of Knowledge: Fair  Language: Fair   Psychomotor Activity  Psychomotor Activity: No data recorded   Assets  Assets: Communication Skills; Desire for Improvement; Physical Health   Sleep  Sleep: No data recorded    Physical Exam: Physical Exam Vitals and nursing note  reviewed.  Constitutional:      Appearance: Normal appearance.  HENT:     Head: Normocephalic and atraumatic.  Eyes:     Extraocular Movements: Extraocular movements intact.  Pulmonary:  Effort: Pulmonary effort is normal.  Musculoskeletal:     Cervical back: Normal range of motion.  Skin:    General: Skin is dry.  Neurological:     Mental Status: He is alert and oriented to person, place, and time. Mental status is at baseline.  Psychiatric:        Attention and Perception: Attention and perception normal.        Mood and Affect: Mood is anxious and depressed. Affect is blunt.        Speech: Speech normal.        Behavior: Behavior normal. Behavior is cooperative.        Thought Content: Thought content normal.        Cognition and Memory: Cognition and memory normal.        Judgment: Judgment is impulsive.    Review of Systems  Psychiatric/Behavioral:  Positive for depression. The patient is nervous/anxious.   All other systems reviewed and are negative.  Blood pressure (!) 119/95, pulse 72, temperature (!) 97.5 F (36.4 C), resp. rate 16, SpO2 97%. There is no height or weight on file to calculate BMI.   Treatment Plan Summary: 09/06/2023: Patient continues to appear significantly depressed, and endorses SI. He is noted to have garble speech and vocal tics. He reports unwitnessed seizure ON and endorses postictal state.   Will order prolactin levels and keppra levels to r/o for seizures and pseudo seizures Check keppra level  neuro consult     Safety and Monitoring:             -- Voluntary admission to inpatient psychiatric unit for safety, stabilization and treatment             -- Daily contact with patient to assess and evaluate symptoms and progress in treatment             -- Patient's case to be discussed in multi-disciplinary team meeting             -- Observation Level: q15 minute checks             -- Vital signs:  q12 hours             -- Precautions:  suicide, elopement, and assault   2. Psychiatric Diagnoses and Treatment:               Continue Risperdal 2 mg HS for mood stabilization/VH Continue Prozac 60 mg daily for depressive sxs  Continue Trazodone 150 mg HS insomnia   -- The risks/benefits/side-effects/alternatives to this medication were discussed in detail with the patient and time was given for questions. The patient consents to medication trial.                -- Metabolic profile and EKG monitoring obtained while on an atypical antipsychotic (BMI:22.45 Lipid Panel:WNL HbgA1c:5.3 QTc:432)              -- Encouraged patient to participate in unit milieu and in scheduled group therapies                            3. Medical Issues Being Addressed:    Continue CIWA protocol, Librium taper                          Hx of Seizures             -- Continue keppra 1000  mg BID   Nicotine dependence  -- Continue Nicotine gum 2 mg PRN  -- encouraged smoking cessation   4. Discharge Planning:              -- Social work and case management to assist with discharge planning and identification of hospital follow-up needs prior to discharge             -- Estimated LOS: 5-7 days             -- Discharge Concerns: Need to establish a safety plan; Medication compliance and effectiveness             -- Discharge Goals: Return home with outpatient referrals follow ups   Physician Treatment Plan for Primary Diagnosis: MDD (major depressive disorder), recurrent severe, without psychosis (HCC) Long Term Goal(s): Improvement in symptoms so as ready for discharge   Short Term Goals: Ability to identify changes in lifestyle to reduce recurrence of condition will improve, Ability to verbalize feelings will improve, Ability to disclose and discuss suicidal ideas, Ability to demonstrate self-control will improve, and Ability to identify and develop effective coping behaviors will improve   Physician Treatment Plan for Secondary Diagnosis:  Principal Problem:   MDD (major depressive disorder), recurrent severe, without psychosis (HCC)   Long Term Goal(s): Improvement in symptoms so as ready for discharge   Short Term Goals: Ability to identify changes in lifestyle to reduce recurrence of condition will improve, Ability to verbalize feelings will improve, Ability to disclose and discuss suicidal ideas, Ability to demonstrate self-control will improve, Ability to identify and develop effective coping behaviors will improve, and Ability to maintain clinical measurements within normal limits will improve  Sanjuana Mae, MD 09/06/2023, 10:39 PM

## 2023-09-06 NOTE — Progress Notes (Signed)
   09/05/23 2000  Psych Admission Type (Psych Patients Only)  Admission Status Voluntary  Psychosocial Assessment  Patient Complaints Anxiety  Eye Contact Fair  Facial Expression Animated  Affect Anxious  Speech Logical/coherent  Interaction Assertive  Motor Activity Restless  Appearance/Hygiene Improved  Behavior Characteristics Cooperative;Appropriate to situation  Mood Pleasant  Thought Process  Coherency WDL  Content WDL  Delusions None reported or observed  Perception WDL  Hallucination None reported or observed  Judgment Impaired  Confusion None  Danger to Self  Current suicidal ideation? Denies  Danger to Others  Danger to Others None reported or observed   Patient alert and oriented x 3 with periods of confusion to situation, he denies SI/HI/AVH.

## 2023-09-06 NOTE — Plan of Care (Signed)
  Problem: Education: Goal: Emotional status will improve Outcome: Progressing   Problem: Education: Goal: Knowledge of [facility] Education information/materials will improve Outcome: Progressing

## 2023-09-07 MED ORDER — MELATONIN 5 MG PO TABS
5.0000 mg | ORAL_TABLET | Freq: Every day | ORAL | Status: DC
  Administered 2023-09-08 – 2023-09-09 (×2): 5 mg via ORAL
  Filled 2023-09-07 (×2): qty 1

## 2023-09-07 NOTE — Group Note (Signed)
 [facility] LCSW Group Therapy Note    Group Date: 09/07/2023 Start Time: 1300 End Time: 1400  Type of Therapy and Topic:  Group Therapy:  Overcoming Obstacles  Participation Level:  [facility] PARTICIPATION LEVEL: Active   Description of Group:   In this group patients will be encouraged to explore what they see as obstacles to their own wellness and recovery. They will be guided to discuss their thoughts, feelings, and behaviors related to these obstacles. The group will process together ways to cope with barriers, with attention given to specific choices patients can make. Each patient will be challenged to identify changes they are motivated to make in order to overcome their obstacles. This group will be process-oriented, with patients participating in exploration of their own experiences as well as giving and receiving support and challenge from other group members.  Therapeutic Goals: 1. Patient will identify personal and current obstacles as they relate to admission. 2. Patient will identify barriers that currently interfere with their wellness or overcoming obstacles.  3. Patient will identify feelings, thought process and behaviors related to these barriers. 4. Patient will identify two changes they are willing to make to overcome these obstacles:    Summary of Patient Progress Patient was present for the entirety of the group process. Alcohol was identified as an obstacle that he is trying to overcome in his life. He questioned what one would do if their psychiatrist didn't work out with them and was encouraged to find another. Insight into the topic remains questionable. He appeared open and receptive to feedback/comments from both his peers and facilitator.    Therapeutic Modalities:   Cognitive Behavioral Therapy Solution Focused Therapy Motivational Interviewing Relapse Prevention Therapy   Glenis Smoker, LCSW

## 2023-09-07 NOTE — Plan of Care (Signed)
  Problem: Activity: Goal: Interest or engagement in activities will improve Outcome: Progressing   Problem: Education: Goal: Verbalization of understanding the information provided will improve Outcome: Progressing   Problem: Education: Goal: Mental status will improve Outcome: Progressing   Problem: Education: Goal: Emotional status will improve Outcome: Progressing

## 2023-09-07 NOTE — Progress Notes (Signed)
   09/05/23 2000  Psych Admission Type (Psych Patients Only)  Admission Status Voluntary  Psychosocial Assessment  Patient Complaints Anxiety  Eye Contact Fair  Facial Expression Animated  Affect Anxious  Speech Logical/coherent  Interaction Assertive  Motor Activity Restless  Appearance/Hygiene Improved  Behavior Characteristics Cooperative;Appropriate to situation  Mood Pleasant  Thought Process  Coherency WDL  Content WDL  Delusions None reported or observed  Perception WDL  Hallucination None reported or observed  Judgment Impaired  Confusion None  Danger to Self  Current suicidal ideation? Denies  Danger to Others  Danger to Others None reported or observed   Patient alert and oriented x 4, affect is blunted thoughts are organized, no distress noted, interacting appropriately with peers and staff.

## 2023-09-07 NOTE — Progress Notes (Signed)
 Pt calm and pleasant during assessment denying SI/HI/AVH. Pt observed by this Clinical research associate interacting appropriately with staff and peers on the unit. Pt compliant with medication administration per MD orders. Pt given education, support, and encouragement to be active in his treatment plan. Pt being monitored Q 15 minutes for safety per unit protocol, remains safe on the unit

## 2023-09-07 NOTE — Group Note (Signed)
 Recreation Therapy Group Note   Group Topic:Coping Skills  Group Date: 09/07/2023 Start Time: 1010 End Time: 1110 Facilitators: Rosina Lowenstein, LRT, CTRS Location:  Craft Room  Group Description: Mind Map.  Patient was provided a blank template of a diagram with 32 blank boxes in a tiered system, branching from the center (similar to a bubble chart). LRT directed patients to label the middle of the diagram "Coping Skills". LRT and patients then came up with 8 different coping skills as examples. Pt were directed to record their coping skills in the 2nd tier boxes closest to the center.  Patients would then share their coping skills with the group as LRT wrote them out. LRT gave a handout of 99 different coping skills at the end of group.   Goal Area(s) Addressed: Patients will be able to define "coping skills". Patient will identify new coping skills.  Patient will increase communication.   Affect/Mood: Appropriate   Participation Level: Active and Engaged   Participation Quality: Independent   Behavior: Appropriate, Calm, and Cooperative   Speech/Thought Process: Coherent   Insight: Good   Judgement: Good   Modes of Intervention: Education, Exploration, Guided Discussion, Worksheet, and Writing   Patient Response to Interventions:  Attentive, Engaged, Interested , and Receptive   Education Outcome:  Acknowledges education   Clinical Observations/Individualized Feedback: Jaideep was active in their participation of session activities and group discussion. Pt identified "camping, laughing, hanging out" as coping skills. Pt spontaneously contributed to group discussion. Pt interacted well with LRT and peers duration of session.    Plan: Continue to engage patient in RT group sessions 2-3x/week.   Rosina Lowenstein, LRT, CTRS 09/07/2023 11:41 AM

## 2023-09-07 NOTE — Group Note (Signed)
 Date:  09/07/2023 Time:  9:39 PM  Group Topic/Focus:  Rediscovering Joy:   The focus of this group is to explore various ways to relieve stress in a positive manner.    Participation Level:  Active  Participation Quality:  Appropriate, Attentive, Sharing, and Supportive  Affect:  Appropriate  Cognitive:  Alert and Appropriate  Insight: Appropriate and Good  Engagement in Group:  Engaged and Improving  Modes of Intervention:  Activity, Clarification, Discussion, Education, Orientation, Problem-solving, Rapport Building, and Support  Additional Comments:     Katlyn Muldrew 09/07/2023, 9:39 PM

## 2023-09-07 NOTE — Progress Notes (Signed)
 [facility] MD Progress Note  09/07/2023 8:59 AM Raymond [name]  MRN:  [id number]   Raymond [name] is a 43 year-old male when presented to the emergency department  yesterday via EMS on 09/02/2023 after he reportedly took 70 pills of all his medication regimen.  He reports that he did that because he was feeling overwhelmed and wanting to kill himself. He also reports that he had been drinking alcohol increasingly to the point of losing awareness. Patient presented to the ED in intoxication state and his alcohol level was 283. His UDS showed cannabinoids in his system. Psych consult was ordered and  inpatient treatment was recommended.  Per chart review, patient was recently  evaluated at [facility] Emergency Department on 08/30/2023 due to suicidal threats. Upon discharge, patient was recommended to follow up with PCP.  On 08/09/2023, patient was admitted to [facility] under Dr Sherron Flemings for worsening depression, anxiety and panic attacks along with suicidal ideations. He reported that he had overdosed on 90 pills of 500 mg Kepra and Trazodone. His medications were adjusted  and, upon discharge, patient was to follow up at [facility] and appointment was scheduled.  Today patient reports the same symptoms although he reports that he has community services . He reports that [name] from his ACT team has been coming to see him. Chart review indicates that patient has had multiple visits to the ED  in recent months, and many  resulting into inpatient hospitalizations. Patient reports that he also goes to the [place] for medication management. He reports having alcohol problem and requests treatment.    Subjective: Patient's case is discussed with multidisciplinary team, all vitals and notes were reviewed.  No reported behavioral issues overnight.  Patient is seen for reassessment, once again today he is reporting that he had an unwitnessed seizure overnight.  States that whenever he has seizures he is immobile and unable to speak.   Reports that he did let nursing staff know, however none of this was reported by nursing staff.  Apparently patient reported 2 nights ago that he also had seizure episode where he had a bout of incontinence, postictal confusion, nursing staff was unable to confirm or deny.  Patient complains of difficulty sleeping at night states he only sleeps about 3 to 4 hours, also that his appetite is not too good and he has not been eating out all.  We will need to verify with staff.  Reports that he was seeing shadows, last was about 2 days ago.  Today he is denying SI or HI and AVH.  Though he does continue to report significant depressed mood.  That he is lonely at home since his mother passed away about 2-1/2 years ago.  Does report that he does therapy once or twice a month, however recently changed therapist.  Per nursing staff, patient is requesting Ensure meal supplements, denies any suicidal ideations.  He is med compliant, does attend groups, has been seen out and about on the milieu multiple times with other peers walking in groups, interacts appropriately with others.  Did follow up with on-call neurologist today regarding consult for patient reported seizures.   Principal Problem: <principal problem not specified> Diagnosis: Active Problems:   MDD (major depressive disorder), recurrent episode, severe (HCC)   MDD (major depressive disorder)   Substance abuse (HCC)  Total Time spent with patient: 45 minutes  Past Psychiatric History:  Previous Psych Diagnoses: Major depressive disorder, recurrent severe without psychotic features, amphetamine abuse, cocaine abuse, substance-induced mood disorder,  with suicide attempt.  Prior inpatient treatment: Admitted to [facility] behavioral health in [place] in 2020 for suicide attempt for 1 month, most recently [facility] 08/2023 Current/prior outpatient treatment: ACT team  Prior rehab hx: Rehab for alcohol x 1 month in 2019 Psychotherapy hx:  Yes History of suicide: multiple attempts History of homicide or aggression: Denies Psychiatric medication history: Thorazine, fluoxetine, hydroxyzine, risperidone, and trazodone. Psychiatric medication compliance history: Noncompliance Current Psychiatrist: ACT team  Current therapist: Seeing Graciella Freer at the [facility]  Past Medical History:  Past Medical History:  Diagnosis Date   Alcohol abuse    Anxiety    Hepatitis C    PTSD (post-traumatic stress disorder)     Past Surgical History:  Procedure Laterality Date   FACIAL FRACTURE SURGERY     metal plate under right eye   Family History: History reviewed. No pertinent family history. Family Psychiatric  History: unknown  Social History:  Social History   Substance and Sexual Activity  Alcohol Use Yes   Comment: 1 liter bottle of vodka prior to arrival     Social History   Substance and Sexual Activity  Drug Use Yes   Types: Methamphetamines    Social History   Socioeconomic History   Marital status: Single    Spouse name: Not on file   Number of children: Not on file   Years of education: Not on file   Highest education level: Not on file  Occupational History   Not on file  Tobacco Use   Smoking status: Every Day    Current packs/day: 1.00    Types: Cigarettes    Passive exposure: Current   Smokeless tobacco: Never  Vaping Use   Vaping status: Never Used  Substance and Sexual Activity   Alcohol use: Yes    Comment: 1 liter bottle of vodka prior to arrival   Drug use: Yes    Types: Methamphetamines   Sexual activity: Not Currently  Other Topics Concern   Not on file  Social History Narrative   Not on file   Social Drivers of Health   Financial Resource Strain: Not on File (08/18/2022)   Received from [employer] [employer]    Financial Resource Strain: 0  Food Insecurity: No Food Insecurity (09/03/2023)   Hunger Vital Sign    Worried About Running Out of Food in the Last  Year: Never true    Ran Out of Food in the Last Year: Never true  Transportation Needs: Unmet Transportation Needs (09/03/2023)   PRAPARE - Administrator, Civil Service (Medical): Yes    Lack of Transportation (Non-Medical): Yes  Physical Activity: Not on File (08/18/2022)   Received from [place], [place]   Physical Activity    Physical Activity: 0  Stress: Not on File (08/18/2022)   Received from [facility], [place]   Stress    Stress: 0  Social Connections: Not on File (02/15/2023)   Received from [employer]   Social Connections    Connectedness: 0   Additional Social History:                         Sleep: Poor  Appetite:  Fair  Current Medications: Current Facility-Administered Medications  Medication Dose Route Frequency Provider Last Rate Last Admin   alum & mag hydroxide-simeth (MAALOX/MYLANTA) 200-200-20 MG/5ML suspension 30 mL  30 mL Oral Q4H PRN Penn, Cranston Neighbor, NP       chlordiazePOXIDE (LIBRIUM)  capsule 25 mg  25 mg Oral Q6H PRN Marlou Sa, NP       FLUoxetine (PROZAC) capsule 60 mg  60 mg Oral Daily Marlou Sa, NP   60 mg at 09/07/23 0759   hydrOXYzine (ATARAX) tablet 25 mg  25 mg Oral Q6H PRN Marlou Sa, NP   25 mg at 09/05/23 1403   levETIRAcetam (KEPPRA) tablet 1,000 mg  1,000 mg Oral BID Onuoha, Chinwendu V, NP   1,000 mg at 09/07/23 0800   loperamide (IMODIUM) capsule 2-4 mg  2-4 mg Oral PRN Marlou Sa, NP       multivitamin with minerals tablet 1 tablet  1 tablet Oral Daily Marlou Sa, NP   1 tablet at 09/07/23 0759   nicotine polacrilex (NICORETTE) gum 2 mg  2 mg Oral PRN Verner Chol, MD   2 mg at 09/05/23 1406   OLANZapine (ZYPREXA) injection 10 mg  10 mg Intramuscular TID PRN Mcneil Sober, NP       OLANZapine (ZYPREXA) injection 5 mg  5 mg Intramuscular TID PRN Mcneil Sober, NP       OLANZapine zydis (ZYPREXA) disintegrating tablet 5 mg  5 mg Oral TID PRN Mcneil Sober, NP   5 mg at 09/05/23 1811    ondansetron (ZOFRAN-ODT) disintegrating tablet 4 mg  4 mg Oral Q6H PRN Marlou Sa, NP   4 mg at 09/05/23 1404   risperiDONE (RISPERDAL) tablet 2 mg  2 mg Oral QHS Rayburn Go, Veronique M, NP   2 mg at 09/06/23 2133   thiamine (VITAMIN B1) tablet 100 mg  100 mg Oral Daily Verner Chol, MD   100 mg at 09/07/23 0759   traZODone (DESYREL) tablet 150 mg  150 mg Oral QHS Marlou Sa, NP   150 mg at 09/06/23 2133    Lab Results:  Results for orders placed or performed during the hospital encounter of 09/03/23 (from the past 48 hours)  Lactic acid, plasma     Status: None   Collection Time: 09/06/23  3:54 PM  Result Value Ref Range   Lactic Acid, Venous 1.3 0.5 - 1.9 mmol/L    Comment: Performed at [facility], [address] [place], [place] [address]    Blood Alcohol level:  Lab Results  Component Value Date   ETH 283 (H) 09/02/2023   ETH 142 (H) 08/30/2023    Metabolic Disorder Labs: Lab Results  Component Value Date   HGBA1C 5.3 08/11/2023   MPG 105.41 08/11/2023   MPG 108.28 03/28/2023   Lab Results  Component Value Date   PROLACTIN 12.0 01/22/2023   PROLACTIN 19.5 11/20/2022   Lab Results  Component Value Date   CHOL 172 08/11/2023   TRIG 146 08/11/2023   HDL 53 08/11/2023   CHOLHDL 3.2 08/11/2023   VLDL 29 08/11/2023   LDLCALC 90 08/11/2023   LDLCALC 84 03/28/2023    Physical Findings: AIMS:  , ,  ,  ,    CIWA:  CIWA-Ar Total: 0 COWS:     Musculoskeletal: Strength & Muscle Tone: within normal limits Gait & Station: normal Patient leans: N/A  Psychiatric Specialty Exam:  Presentation  General Appearance:  Casual  Eye Contact: Minimal (diminished, avoiding)  Speech: Clear and Coherent  Speech Volume: Normal  Handedness: Right   Mood and Affect  Mood: Anxious; Depressed  Affect: Congruent   Thought Process  Thought Processes: Coherent  Descriptions of Associations:Intact  Orientation:Full (Time,  Place and Person)  Thought Content:WDL  History of Schizophrenia/Schizoaffective disorder:No  Duration of Psychotic Symptoms:No data recorded Hallucinations:No data recorded  Ideas of Reference:None  Suicidal Thoughts:No data recorded  Homicidal Thoughts:No data recorded   Sensorium  Memory: Immediate Fair; Recent Fair; Remote Fair  Judgment: Fair  Insight: Fair   Chartered certified accountant: Fair  Attention Span: Fair  Recall: [employer] of Knowledge: Fair  Language: Fair   Psychomotor Activity  Psychomotor Activity: No data recorded   Assets  Assets: Communication Skills; Desire for Improvement; Physical Health   Sleep  Sleep: No data recorded    Physical Exam: Physical Exam Vitals and nursing note reviewed.  Constitutional:      Appearance: Normal appearance.  HENT:     Head: Normocephalic and atraumatic.  Eyes:     Extraocular Movements: Extraocular movements intact.  Pulmonary:     Effort: Pulmonary effort is normal.  Musculoskeletal:     Cervical back: Normal range of motion.  Skin:    General: Skin is dry.  Neurological:     Mental Status: He is alert and oriented to person, place, and time. Mental status is at baseline.  Psychiatric:        Attention and Perception: Attention and perception normal.        Mood and Affect: Mood is anxious and depressed. Affect is blunt.        Speech: Speech normal.        Behavior: Behavior normal. Behavior is cooperative.        Thought Content: Thought content normal.        Cognition and Memory: Cognition and memory normal.        Judgment: Judgment is impulsive.    Review of Systems  Neurological:  Positive for seizures.  Psychiatric/Behavioral:  Positive for depression. The patient is nervous/anxious and has insomnia.   All other systems reviewed and are negative.  Blood pressure (!) 139/104, pulse 84, temperature 98 F (36.7 C), resp. rate (!) 22, SpO2 96%. There is no  height or weight on file to calculate BMI.   Treatment Plan Summary:   Safety and Monitoring:             -- Voluntary admission to inpatient psychiatric unit for safety, stabilization and treatment             -- Daily contact with patient to assess and evaluate symptoms and progress in treatment             -- Patient's case to be discussed in multi-disciplinary team meeting             -- Observation Level: q15 minute checks             -- Vital signs:  q12 hours             -- Precautions: suicide, elopement, and assault   2. Psychiatric Diagnoses and Treatment:                09/07/23: Pt continues to endorse significant depressed mood, difficulties sleeping reduced appetite. Will continue to monitor for further treatment and stabilization  Continue Risperdal 2 mg HS for mood stabilization/VH Continue Prozac 60 mg daily for depressive sxs  Continue Trazodone 150 mg HS insomnia Start melatonin 5 mg HS for sleep   -- The risks/benefits/side-effects/alternatives to this medication were discussed in detail with the patient and time was given for questions. The patient consents to medication trial.                --  Metabolic profile and EKG monitoring obtained while on an atypical antipsychotic (BMI:22.45 Lipid Panel:WNL HbgA1c:5.3 QTc:432)              -- Encouraged patient to participate in unit milieu and in scheduled group therapies                            3. Medical Issues Being Addressed:    Hx of seizures  -- Continue keppra 1000 mg BID  -- 4/6 Neurology consulted for suspected breakthrough seizure.  Ordered Keppra level.  Also ordered  lactic acid and  prolactin level. -- 4/7 f/u with Neuro re: pt reported unwitnessed seizures, yet to be seen, on call Neurologist reports will see pt tomorrow. Lactic acid 1.3 Wnl, Keppra and prolactin pending    Alcohol abuse   Continue CIWA protocol, Librium taper               Nicotine dependence  -- Continue Nicotine gum 2 mg  PRN  -- encouraged smoking cessation   4. Discharge Planning:              -- Social work and case management to assist with discharge planning and identification of hospital follow-up needs prior to discharge             -- Estimated LOS: 5-7 days             -- Discharge Concerns: Need to establish a safety plan; Medication compliance and effectiveness             -- Discharge Goals: Return home with outpatient referrals follow ups   Physician Treatment Plan for Primary Diagnosis: MDD (major depressive disorder), recurrent severe, without psychosis (HCC) Long Term Goal(s): Improvement in symptoms so as ready for discharge   Short Term Goals: Ability to identify changes in lifestyle to reduce recurrence of condition will improve, Ability to verbalize feelings will improve, Ability to disclose and discuss suicidal ideas, Ability to demonstrate self-control will improve, and Ability to identify and develop effective coping behaviors will improve   Physician Treatment Plan for Secondary Diagnosis: Principal Problem:   MDD (major depressive disorder), recurrent severe, without psychosis (HCC)   Long Term Goal(s): Improvement in symptoms so as ready for discharge   Short Term Goals: Ability to identify changes in lifestyle to reduce recurrence of condition will improve, Ability to verbalize feelings will improve, Ability to disclose and discuss suicidal ideas, Ability to demonstrate self-control will improve, Ability to identify and develop effective coping behaviors will improve, and Ability to maintain clinical measurements within normal limits will improve  Michaeljohn Biss, PA-C 09/07/2023, 8:59 AM

## 2023-09-07 NOTE — Progress Notes (Addendum)
   09/07/23 1100  Psych Admission Type (Psych Patients Only)  Admission Status Voluntary  Psychosocial Assessment  Patient Complaints Sleep disturbance  Eye Contact Fair  Facial Expression Animated  Affect Anxious  Speech Logical/coherent  Interaction Assertive  Motor Activity Slow  Appearance/Hygiene Unremarkable  Behavior Characteristics Cooperative;Appropriate to situation  Mood Anxious;Pleasant  Thought Process  Coherency WDL  Content WDL  Delusions None reported or observed  Perception WDL  Hallucination None reported or observed  Judgment Impaired  Confusion None  Danger to Self  Current suicidal ideation? Denies  Danger to Others  Danger to Others None reported or observed   Patient states " other than my seizure I am fine." Patient states that he had a seizure last night.

## 2023-09-07 NOTE — Plan of Care (Signed)
  Problem: Education: Goal: Knowledge of [facility] Education information/materials will improve Outcome: Progressing   Problem: Education: Goal: Emotional status will improve Outcome: Progressing   Problem: Education: Goal: Mental status will improve Outcome: Progressing   Problem: Education: Goal: Verbalization of understanding the information provided will improve Outcome: Progressing   

## 2023-09-07 NOTE — Group Note (Signed)
 Recreation Therapy Group Note   Group Topic:Relaxation  Group Date: 09/07/2023 Start Time: 1530 End Time: 1610 Facilitators: Rosina Lowenstein, LRT, CTRS Location:  Dayroom  Group Description: Meditation. LRT and patients discussed what they know about meditation and mindfulness. LRT played a Deep Breathing Meditation exercise script for patients to follow along to. LRT and patients discussed how meditation and deep breathing can be used as a coping skill post--discharge to help manage symptoms of stress.   Goal Area(s) Addressed: Patient will practice using relaxation technique. Patient will identify a new coping skill.  Patient will follow multistep directions to reduce anxiety and stress.   Affect/Mood: Appropriate   Participation Level: Active   Participation Quality: Independent   Behavior: Appropriate   Speech/Thought Process: Coherent   Insight: Good   Judgement: Good   Modes of Intervention: Activity   Patient Response to Interventions:  Receptive   Education Outcome:  Acknowledges education   Clinical Observations/Individualized Feedback: Raymond [name] was active in their participation of session activities and group discussion. Pt interacted well with LRT and peers duration of session.    Plan: Continue to engage patient in RT group sessions 2-3x/week.   Rosina Lowenstein, LRT, CTRS 09/07/2023 5:13 PM

## 2023-09-08 DIAGNOSIS — F329 Major depressive disorder, single episode, unspecified: Secondary | ICD-10-CM | POA: Diagnosis not present

## 2023-09-08 LAB — PROLACTIN: Prolactin: 38 ng/mL — ABNORMAL HIGH (ref 3.9–22.7)

## 2023-09-08 LAB — LEVETIRACETAM LEVEL: Levetiracetam Lvl: 16.7 ug/mL (ref 10.0–40.0)

## 2023-09-08 NOTE — Group Note (Signed)
 Recreation Therapy Group Note   Group Topic:General Recreation  Group Date: 09/08/2023 Start Time: 1515 End Time: 1615 Facilitators: Rosina Lowenstein, LRT, CTRS Location: Courtyard  Group Description: [employer]. LRT and patients played games of basketball, drew with chalk, and played corn hole while outside in the courtyard while getting fresh air and sunlight. Music was being played in the background. LRT and peers conversed about different games they have played before, what they do in their free time and anything else that is on their minds. LRT encouraged pts to drink water after being outside, sweating and getting their heart rate up.  Goal Area(s) Addressed: Patient will build on frustration tolerance skills. Patients will partake in a competitive play game with peers. Patients will gain knowledge of new leisure interest/hobby.    Affect/Mood: Appropriate   Participation Level: Active   Participation Quality: Independent   Behavior: Appropriate   Speech/Thought Process: Coherent   Insight: Fair   Judgement: Fair    Modes of Intervention: Activity   Patient Response to Interventions:  Receptive   Education Outcome:  Acknowledges education   Clinical Observations/Individualized Feedback: Raymond [name] was active in their participation of session activities and group discussion. Pt interacted well with LRT and peers duration of session.    Plan: Continue to engage patient in RT group sessions 2-3x/week.   Rosina Lowenstein, LRT, CTRS 09/08/2023 5:18 PM

## 2023-09-08 NOTE — Plan of Care (Signed)
  Problem: Education: Goal: Knowledge of [facility] Education information/materials will improve Outcome: Progressing Goal: Mental status will improve Outcome: Progressing   Problem: Activity: Goal: Interest or engagement in activities will improve Outcome: Progressing   Problem: Coping: Goal: Ability to verbalize frustrations and anger appropriately will improve Outcome: Progressing   Problem: Health Behavior/Discharge Planning: Goal: Identification of resources available to assist in meeting health care needs will improve Outcome: Progressing   Problem: Safety: Goal: Periods of time without injury will increase Outcome: Progressing

## 2023-09-08 NOTE — Group Note (Signed)
 Date:  09/08/2023 Time:  9:29 PM  Group Topic/Focus:  Wrap-Up Group:   The focus of this group is to help patients review their daily goal of treatment and discuss progress on daily workbooks.    Participation Level:  Active  Participation Quality:  Appropriate  Affect:  Appropriate  Cognitive:  Appropriate  Insight: Appropriate  Engagement in Group:  Engaged  Modes of Intervention:  Discussion  Additional Comments:     Belva Crome 09/08/2023, 9:29 PM

## 2023-09-08 NOTE — Progress Notes (Signed)
   09/08/23 1600  Psych Admission Type (Psych Patients Only)  Admission Status Voluntary  Psychosocial Assessment  Patient Complaints Anxiety  Eye Contact Fair  Facial Expression Animated  Affect Anxious  Speech Logical/coherent  Interaction Assertive  Motor Activity Slow  Appearance/Hygiene Unremarkable  Behavior Characteristics Cooperative  Mood Anxious  Thought Process  Coherency WDL  Content WDL  Delusions None reported or observed  Perception WDL  Hallucination None reported or observed  Judgment Impaired  Confusion None  Danger to Self  Current suicidal ideation? Denies  Agreement Not to Harm Self Yes  Description of Agreement verbal  Danger to Others  Danger to Others None reported or observed

## 2023-09-08 NOTE — Plan of Care (Signed)
   Problem: Education: Goal: Emotional status will improve Outcome: Progressing Goal: Mental status will improve Outcome: Progressing   Problem: Activity: Goal: Sleeping patterns will improve Outcome: Progressing

## 2023-09-08 NOTE — Group Note (Signed)
 Recreation Therapy Group Note   Group Topic:Goal Setting  Group Date: 09/08/2023 Start Time: 1000 End Time: 1110 Facilitators: Rosina Lowenstein, LRT, CTRS Location:  Craft Room  Group Description: Vision Boards. Patients were given many different magazines, a glue stick, markers, and a piece of cardstock paper. LRT and pts discussed the importance of having goals in life. LRT and pts discussed the difference between short-term and long-term goals, as well as what a SMART goal is. LRT encouraged pts to create a vision board, with images they picked and then cut out with safety scissors from the magazine, for themselves, that capture their short and long-term goals. LRT encouraged pts to show and explain their vision board to the group.   Goal Area(s) Addressed:  Patient will gain knowledge of short vs. long term goals.  Patient will identify goals for themselves. Patient will practice setting SMART goals. Patient will verbalize their goals to LRT and peers.   Affect/Mood: Appropriate   Participation Level: Active and Engaged   Participation Quality: Independent   Behavior: Calm and Cooperative   Speech/Thought Process: Coherent   Insight: Good   Judgement: Good   Modes of Intervention: Art, Education, Exploration, and Guided Discussion   Patient Response to Interventions:  Attentive, Engaged, Interested , and Receptive   Education Outcome:  Acknowledges education   Clinical Observations/Individualized Feedback: Hermenegildo was active in their participation of session activities and group discussion. Pt identified "I want to play football again and I want to have babies since the two of mine died" as his goals. Pt interacted well with LRT and peers duration of session.    Plan: Continue to engage patient in RT group sessions 2-3x/week.   Rosina Lowenstein, LRT, CTRS 09/08/2023 11:39 AM

## 2023-09-08 NOTE — Progress Notes (Signed)
 [facility] MD Progress Note  09/08/2023 4:41 PM Raymond [name]  MRN:  [id number]   Raymond [name] is a 43 year-old male when presented to the emergency department  yesterday via EMS on 09/02/2023 after he reportedly took 70 pills of all his medication regimen.  He reports that he did that because he was feeling overwhelmed and wanting to kill himself. He also reports that he had been drinking alcohol increasingly to the point of losing awareness. Patient presented to the ED in intoxication state and his alcohol level was 283. His UDS showed cannabinoids in his system. Psych consult was ordered and  inpatient treatment was recommended.  Per chart review, patient was recently  evaluated at [facility] health Emergency Department on 08/30/2023 due to suicidal threats. Upon discharge, patient was recommended to follow up with PCP.  On 08/09/2023, patient was admitted to [facility] under Dr Sherron Flemings for worsening depression, anxiety and panic attacks along with suicidal ideations. He reported that he had overdosed on 90 pills of 500 mg Kepra and Trazodone. His medications were adjusted  and, upon discharge, patient was to follow up at [facility] and appointment was scheduled.  Today patient reports the same symptoms although he reports that he has community services . He reports that [name] from his ACT team has been coming to see him. Chart review indicates that patient has had multiple visits to the ED  in recent months, and many  resulting into inpatient hospitalizations. Patient reports that he also goes to the [place] for medication management. He reports having alcohol problem and requests treatment.    Subjective: Patient's case is discussed with multidisciplinary team, all vitals and notes were reviewed.  No reported behavioral issues overnight.  Patient is seen for reassessment, reports that he is doing okay, continues to report that he is lonely at his home because his mother passed away 3 years ago, and his children are not  close to him.  Did discuss with him possibility of moving to an independent living facility or group home where he will have other individuals in the environment, at this time he is not agreeable.  Relaying that it took him a couple years to get his current apartment, and that he has a lot of friends who are supportive, that he invites them over, and that he goes to other programs throughout the day where he can interact with others.  States that he continues to have difficulty sleeping at night however this is normal for him and he only sleeps about 3 to 4 hours.  Reports good appetite.  Continues to endorse depression which he rates as a 5/10, anxiety/10.  Denies SI/HI and AVH.  Denies any medication side effects.  He does continue to minimize alcohol use, stating that he does not drink often, and he does not drink much.   Principal Problem: <principal problem not specified> Diagnosis: Active Problems:   MDD (major depressive disorder), recurrent episode, severe (HCC)   MDD (major depressive disorder)   Substance abuse (HCC)  Total Time spent with patient: 45 minutes  Past Psychiatric History:  Previous Psych Diagnoses: Major depressive disorder, recurrent severe without psychotic features, amphetamine abuse, cocaine abuse, substance-induced mood disorder, with suicide attempt.  Prior inpatient treatment: Admitted to [facility] behavioral health in [place] in 2020 for suicide attempt for 1 month, most recently [facility] 08/2023 Current/prior outpatient treatment: ACT team  Prior rehab hx: Rehab for alcohol x 1 month in 2019 Psychotherapy hx: Yes History of suicide: multiple attempts History of  homicide or aggression: Denies Psychiatric medication history: Thorazine, fluoxetine, hydroxyzine, risperidone, and trazodone. Psychiatric medication compliance history: Noncompliance Current Psychiatrist: ACT team  Current therapist: Seeing Graciella Freer at the [facility]  Past Medical History:   Past Medical History:  Diagnosis Date   Alcohol abuse    Anxiety    Hepatitis C    PTSD (post-traumatic stress disorder)     Past Surgical History:  Procedure Laterality Date   FACIAL FRACTURE SURGERY     metal plate under right eye   Family History: History reviewed. No pertinent family history. Family Psychiatric  History: unknown  Social History:  Social History   Substance and Sexual Activity  Alcohol Use Yes   Comment: 1 liter bottle of vodka prior to arrival     Social History   Substance and Sexual Activity  Drug Use Yes   Types: Methamphetamines    Social History   Socioeconomic History   Marital status: Single    Spouse name: Not on file   Number of children: Not on file   Years of education: Not on file   Highest education level: Not on file  Occupational History   Not on file  Tobacco Use   Smoking status: Every Day    Current packs/day: 1.00    Types: Cigarettes    Passive exposure: Current   Smokeless tobacco: Never  Vaping Use   Vaping status: Never Used  Substance and Sexual Activity   Alcohol use: Yes    Comment: 1 liter bottle of vodka prior to arrival   Drug use: Yes    Types: Methamphetamines   Sexual activity: Not Currently  Other Topics Concern   Not on file  Social History Narrative   Not on file   Social Drivers of Health   Financial Resource Strain: Not on File (08/18/2022)   Received from [employer] [employer]    Financial Resource Strain: 0  Food Insecurity: No Food Insecurity (09/03/2023)   Hunger Vital Sign    Worried About Running Out of Food in the Last Year: Never true    Ran Out of Food in the Last Year: Never true  Transportation Needs: Unmet Transportation Needs (09/03/2023)   PRAPARE - Administrator, Civil Service (Medical): Yes    Lack of Transportation (Non-Medical): Yes  Physical Activity: Not on File (08/18/2022)   Received from [place], [place]   Physical Activity    Physical  Activity: 0  Stress: Not on File (08/18/2022)   Received from [facility], [place]   Stress    Stress: 0  Social Connections: Not on File (02/15/2023)   Received from [employer]   Social Connections    Connectedness: 0   Additional Social History:                         Sleep: Poor  Appetite:  Fair  Current Medications: Current Facility-Administered Medications  Medication Dose Route Frequency Provider Last Rate Last Admin   alum & mag hydroxide-simeth (MAALOX/MYLANTA) 200-200-20 MG/5ML suspension 30 mL  30 mL Oral Q4H PRN Penn, Cicely, NP       FLUoxetine (PROZAC) capsule 60 mg  60 mg Oral Daily Rayburn Go, Veronique M, NP   60 mg at 09/08/23 0833   levETIRAcetam (KEPPRA) tablet 1,000 mg  1,000 mg Oral BID Onuoha, Chinwendu V, NP   1,000 mg at 09/08/23 0833   melatonin tablet 5 mg  5 mg Oral QHS Benjimen Kelley,  Judeth Cornfield, PA-C       multivitamin with minerals tablet 1 tablet  1 tablet Oral Daily Marlou Sa, NP   1 tablet at 09/08/23 9147   nicotine polacrilex (NICORETTE) gum 2 mg  2 mg Oral PRN Verner Chol, MD   2 mg at 09/05/23 1406   OLANZapine (ZYPREXA) injection 10 mg  10 mg Intramuscular TID PRN Mcneil Sober, NP       OLANZapine (ZYPREXA) injection 5 mg  5 mg Intramuscular TID PRN Mcneil Sober, NP       OLANZapine zydis (ZYPREXA) disintegrating tablet 5 mg  5 mg Oral TID PRN Mcneil Sober, NP   5 mg at 09/05/23 1811   risperiDONE (RISPERDAL) tablet 2 mg  2 mg Oral QHS Olin Pia M, NP   2 mg at 09/07/23 2115   thiamine (VITAMIN B1) tablet 100 mg  100 mg Oral Daily Verner Chol, MD   100 mg at 09/08/23 8295   traZODone (DESYREL) tablet 150 mg  150 mg Oral QHS Marlou Sa, NP   150 mg at 09/07/23 2115    Lab Results:  No results found for this or any previous visit (from the past 48 hours).   Blood Alcohol level:  Lab Results  Component Value Date   ETH 283 (H) 09/02/2023   ETH 142 (H) 08/30/2023    Metabolic Disorder Labs: Lab Results   Component Value Date   HGBA1C 5.3 08/11/2023   MPG 105.41 08/11/2023   MPG 108.28 03/28/2023   Lab Results  Component Value Date   PROLACTIN 38.0 (H) 09/06/2023   PROLACTIN 12.0 01/22/2023   Lab Results  Component Value Date   CHOL 172 08/11/2023   TRIG 146 08/11/2023   HDL 53 08/11/2023   CHOLHDL 3.2 08/11/2023   VLDL 29 08/11/2023   LDLCALC 90 08/11/2023   LDLCALC 84 03/28/2023    Physical Findings: AIMS:  , ,  ,  ,    CIWA:  CIWA-Ar Total: 0 COWS:     Musculoskeletal: Strength & Muscle Tone: within normal limits Gait & Station: normal Patient leans: N/A  Psychiatric Specialty Exam:  Presentation  General Appearance:  Casual  Eye Contact: Minimal (diminished, avoiding)  Speech: Clear and Coherent  Speech Volume: Normal  Handedness: Right   Mood and Affect  Mood: Anxious; Depressed  Affect: Congruent   Thought Process  Thought Processes: Coherent  Descriptions of Associations:Intact  Orientation:Full (Time, Place and Person)  Thought Content:WDL  History of Schizophrenia/Schizoaffective disorder:No  Duration of Psychotic Symptoms:No data recorded Hallucinations:No data recorded  Ideas of Reference:None  Suicidal Thoughts:No data recorded  Homicidal Thoughts:No data recorded   Sensorium  Memory: Immediate Fair; Recent Fair; Remote Fair  Judgment: Fair  Insight: Fair   Art therapist  Concentration: Fair  Attention Span: Fair  Recall: [employer] of Knowledge: Fair  Language: Fair   Psychomotor Activity  Psychomotor Activity: No data recorded   Assets  Assets: Communication Skills; Desire for Improvement; Physical Health   Sleep  Sleep: No data recorded    Physical Exam: Physical Exam Vitals and nursing note reviewed.  Constitutional:      Appearance: Normal appearance.  HENT:     Head: Normocephalic and atraumatic.  Eyes:     Extraocular Movements: Extraocular movements intact.   Pulmonary:     Effort: Pulmonary effort is normal.  Musculoskeletal:     Cervical back: Normal range of motion.  Skin:    General: Skin is dry.  Neurological:  Mental Status: He is alert and oriented to person, place, and time. Mental status is at baseline.  Psychiatric:        Attention and Perception: Attention and perception normal.        Mood and Affect: Mood is anxious and depressed. Affect is blunt.        Speech: Speech normal.        Behavior: Behavior normal. Behavior is cooperative.        Thought Content: Thought content normal.        Cognition and Memory: Cognition and memory normal.        Judgment: Judgment is impulsive.    Review of Systems  Neurological:  Positive for seizures.  Psychiatric/Behavioral:  Positive for depression and substance abuse. The patient is nervous/anxious and has insomnia.   All other systems reviewed and are negative.  Blood pressure 122/88, pulse 86, temperature 98 F (36.7 C), resp. rate (!) 21, SpO2 97%. There is no height or weight on file to calculate BMI.   Treatment Plan Summary:   Safety and Monitoring:             -- Voluntary admission to inpatient psychiatric unit for safety, stabilization and treatment             -- Daily contact with patient to assess and evaluate symptoms and progress in treatment             -- Patient's case to be discussed in multi-disciplinary team meeting             -- Observation Level: q15 minute checks             -- Vital signs:  q12 hours             -- Precautions: suicide, elopement, and assault   2. Psychiatric Diagnoses and Treatment:                09/07/23: Pt continues to endorse significant depressed mood, difficulties sleeping reduced appetite. Will continue to monitor for further treatment and stabilization  Continue Risperdal 2 mg HS for mood stabilization/VH Continue Prozac 60 mg daily for depressive sxs  Continue Trazodone 150 mg HS insomnia Start melatonin 5 mg HS for  sleep   -- The risks/benefits/side-effects/alternatives to this medication were discussed in detail with the patient and time was given for questions. The patient consents to medication trial.                -- Metabolic profile and EKG monitoring obtained while on an atypical antipsychotic (BMI:22.45 Lipid Panel:WNL HbgA1c:5.3 QTc:432)              -- Encouraged patient to participate in unit milieu and in scheduled group therapies                            3. Medical Issues Being Addressed:    Hx of seizures  -- Continue keppra 1000 mg BID  -- 4/6 Neurology consulted for suspected breakthrough seizure.  Ordered Keppra level.  Also ordered  lactic acid and  prolactin level. -- 4/7 f/u with Neuro re: pt reported unwitnessed seizures, yet to be seen, on call Neurologist reports will see pt tomorrow. Lactic acid 1.3 Wnl, Keppra and prolactin pending  -- 4/8 Nuerologist recommendations to switch from keppra to Vimpat as keppra could be contributing to depressive sxs.    Alcohol abuse   Continue CIWA protocol, Librium  taper               Nicotine dependence  -- Continue Nicotine gum 2 mg PRN  -- encouraged smoking cessation   4. Discharge Planning:              -- Social work and case management to assist with discharge planning and identification of hospital follow-up needs prior to discharge             -- Estimated LOS: 5-7 days             -- Discharge Concerns: Need to establish a safety plan; Medication compliance and effectiveness             -- Discharge Goals: Return home with outpatient referrals follow ups   Physician Treatment Plan for Primary Diagnosis: MDD (major depressive disorder), recurrent severe, without psychosis (HCC) Long Term Goal(s): Improvement in symptoms so as ready for discharge   Short Term Goals: Ability to identify changes in lifestyle to reduce recurrence of condition will improve, Ability to verbalize feelings will improve, Ability to disclose and  discuss suicidal ideas, Ability to demonstrate self-control will improve, and Ability to identify and develop effective coping behaviors will improve   Physician Treatment Plan for Secondary Diagnosis: Principal Problem:   MDD (major depressive disorder), recurrent severe, without psychosis (HCC)   Long Term Goal(s): Improvement in symptoms so as ready for discharge   Short Term Goals: Ability to identify changes in lifestyle to reduce recurrence of condition will improve, Ability to verbalize feelings will improve, Ability to disclose and discuss suicidal ideas, Ability to demonstrate self-control will improve, Ability to identify and develop effective coping behaviors will improve, and Ability to maintain clinical measurements within normal limits will improve  Bastion Bolger, PA-C 09/08/2023, 4:41 PM

## 2023-09-08 NOTE — Group Note (Signed)
 LCSW Group Therapy Note  Group Date: 09/08/2023 Start Time: 1300 End Time: 1400   Type of Therapy and Topic:  Group Therapy: Using "I" Statements  Participation Level:  Active  Description of Group:  Patients were asked to provide details of some interpersonal conflicts they have experienced. Patients were then educated about "I" statements, communication which focuses on feelings or views of the speaker rather than what the other person is doing. T group members were asked to reflect on past conflicts and to provide specific examples for utilizing "I" statements.  Therapeutic Goals:  Patients will verbalize understanding of ineffective communication and effective communication. Patients will be able to empathize with whom they are having conflict. Patients will practice effective communication in the form of "I" statements.    Summary of Patient Progress:  Patient shared openly. The patient was present and active throughout the session and proved open to feedback from CSW and peers. Patient demonstrated proficient insight into the subject matter, was respectful of peers, and was present throughout the entire session.  Therapeutic Modalities:   Cognitive Behavioral Therapy Solution-Focused Therapy    Lowry Ram, LCSW 09/08/2023  2:03 PM

## 2023-09-09 ENCOUNTER — Other Ambulatory Visit: Payer: Self-pay

## 2023-09-09 DIAGNOSIS — F332 Major depressive disorder, recurrent severe without psychotic features: Secondary | ICD-10-CM | POA: Diagnosis not present

## 2023-09-09 DIAGNOSIS — F1010 Alcohol abuse, uncomplicated: Secondary | ICD-10-CM

## 2023-09-09 DIAGNOSIS — F32A Depression, unspecified: Secondary | ICD-10-CM

## 2023-09-09 DIAGNOSIS — R569 Unspecified convulsions: Secondary | ICD-10-CM

## 2023-09-09 MED ORDER — LACOSAMIDE 50 MG PO TABS
100.0000 mg | ORAL_TABLET | Freq: Two times a day (BID) | ORAL | Status: DC
  Administered 2023-09-10: 100 mg via ORAL
  Filled 2023-09-09: qty 2

## 2023-09-09 MED ORDER — FLUOXETINE HCL 20 MG PO CAPS: 60.0000 mg | ORAL_CAPSULE | Freq: Every day | ORAL | 0 refills | Status: DC

## 2023-09-09 MED ORDER — NICOTINE POLACRILEX 2 MG MT GUM
2.0000 mg | CHEWING_GUM | OROMUCOSAL | 0 refills | Status: DC | PRN
Start: 2023-09-09 — End: 2024-03-02

## 2023-09-09 MED ORDER — LEVETIRACETAM 500 MG PO TABS
500.0000 mg | ORAL_TABLET | Freq: Two times a day (BID) | ORAL | Status: DC
  Administered 2023-09-10: 500 mg via ORAL
  Filled 2023-09-09 (×2): qty 1

## 2023-09-09 MED ORDER — MELATONIN 5 MG PO TABS
5.0000 mg | ORAL_TABLET | Freq: Every day | ORAL | 0 refills | Status: DC
Start: 2023-09-09 — End: 2024-03-02
  Filled 2023-09-09: qty 30, 30d supply, fill #0

## 2023-09-09 MED ORDER — VITAMIN D3 25 MCG PO TABS
1000.0000 [IU] | ORAL_TABLET | Freq: Every day | ORAL | 0 refills | Status: DC
Start: 2023-09-09 — End: 2024-03-02

## 2023-09-09 MED ORDER — VITAMIN B-1 100 MG PO TABS
100.0000 mg | ORAL_TABLET | Freq: Every day | ORAL | 0 refills | Status: DC
  Filled 2023-09-09: qty 30, 30d supply, fill #0

## 2023-09-09 MED ORDER — TRAZODONE HCL 100 MG PO TABS
200.0000 mg | ORAL_TABLET | Freq: Every day | ORAL | 0 refills | Status: AC
  Filled 2023-09-09: qty 60, 30d supply, fill #0

## 2023-09-09 MED ORDER — SODIUM CHLORIDE 0.9 % IV SOLN
200.0000 mg | Freq: Once | INTRAVENOUS | Status: DC
  Filled 2023-09-09: qty 20

## 2023-09-09 MED ORDER — RISPERIDONE 2 MG PO TABS
2.0000 mg | ORAL_TABLET | Freq: Every day | ORAL | 0 refills | Status: AC
Start: 2023-09-09 — End: ?

## 2023-09-09 MED ORDER — TRAZODONE HCL 100 MG PO TABS
200.0000 mg | ORAL_TABLET | Freq: Every day | ORAL | Status: DC
  Administered 2023-09-09: 200 mg via ORAL
  Filled 2023-09-09: qty 2

## 2023-09-09 NOTE — BHH Counselor (Signed)
 Raymond [name], representative from [place] [employer] [place] called to make CSW aware that patient has a [place] [employer]. Raymond [name] explained that with this patient will not be covered for any non-emergent care needs. Patient will have no coverage for those levels of care outside of [place].  Raymond [name] explained that if the patient decides to stay in [place], he will need to contact the Marketplace exchange to transition to a [place] policy. Raymond [name] provided the contact information: [phone] www.healthcare.gov  Raymond [name] can be contacted at [phone]  This has been communicated to patient and patient has been provided with the needed information to navigate his personal insurance needs.   CSW to continue to assist as needed.    Raymond [name], MSW, LCSWA 09/09/2023 9:14 AM

## 2023-09-09 NOTE — Progress Notes (Signed)
 Pt calm and pleasant during assessment denying SI/HI/AVH. Pt observed by this Clinical research associate interacting appropriately with staff and peers on the unit. Pt compliant with medication administration per MD orders. Pt given education, support, and encouragement to be active in his treatment plan. Pt being monitored Q 15 minutes for safety per unit protocol, remains safe on the unit

## 2023-09-09 NOTE — BH IP Treatment Plan (Addendum)
 Interdisciplinary Treatment and Diagnostic Plan Update  09/09/2023 Time of Session: 10:30 Raymond [name] MRN: [id number]  Principal Diagnosis: <principal problem not specified>  Secondary Diagnoses: Active Problems:   MDD (major depressive disorder), recurrent episode, severe (HCC)   MDD (major depressive disorder)   Substance abuse (HCC)   Current Medications:  Current Facility-Administered Medications  Medication Dose Route Frequency Provider Last Rate Last Admin   alum & mag hydroxide-simeth (MAALOX/MYLANTA) 200-200-20 MG/5ML suspension 30 mL  30 mL Oral Q4H PRN Penn, Cicely, NP       FLUoxetine (PROZAC) capsule 60 mg  60 mg Oral Daily Rayburn Go, Veronique M, NP   60 mg at 09/09/23 0843   levETIRAcetam (KEPPRA) tablet 1,000 mg  1,000 mg Oral BID Onuoha, Chinwendu V, NP   1,000 mg at 09/09/23 0843   melatonin tablet 5 mg  5 mg Oral QHS Tingling, Stephanie, PA-C   5 mg at 09/08/23 2104   multivitamin with minerals tablet 1 tablet  1 tablet Oral Daily Marlou Sa, NP   1 tablet at 09/09/23 4098   nicotine polacrilex (NICORETTE) gum 2 mg  2 mg Oral PRN Verner Chol, MD   2 mg at 09/05/23 1406   OLANZapine (ZYPREXA) injection 10 mg  10 mg Intramuscular TID PRN Mcneil Sober, NP       OLANZapine (ZYPREXA) injection 5 mg  5 mg Intramuscular TID PRN Mcneil Sober, NP       OLANZapine zydis (ZYPREXA) disintegrating tablet 5 mg  5 mg Oral TID PRN Mcneil Sober, NP   5 mg at 09/05/23 1811   risperiDONE (RISPERDAL) tablet 2 mg  2 mg Oral QHS Rayburn Go, Veronique M, NP   2 mg at 09/08/23 2104   thiamine (VITAMIN B1) tablet 100 mg  100 mg Oral Daily Verner Chol, MD   100 mg at 09/09/23 0843   traZODone (DESYREL) tablet 150 mg  150 mg Oral QHS Marlou Sa, NP   150 mg at 09/08/23 2104   PTA Medications: Medications Prior to Admission  Medication Sig Dispense Refill Last Dose/Taking   albuterol (VENTOLIN HFA) 108 (90 Base) MCG/ACT inhaler Inhale 2 puffs into the lungs every 4  (four) hours as needed for wheezing or shortness of breath. 6.7 g 0 Unknown   cloNIDine (CATAPRES) 0.1 MG tablet Take 1 tablet (0.1 mg total) by mouth 2 (two) times daily. (Patient not taking: Reported on 09/03/2023) 60 tablet 11 Unknown   docusate sodium (COLACE) 100 MG capsule Take 1 capsule (100 mg total) by mouth 2 (two) times daily. 10 capsule 0 Unknown   FLUoxetine (PROZAC) 20 MG capsule Take 3 capsules (60 mg total) by mouth daily. 90 capsule 3 Unknown   levETIRAcetam (KEPPRA) 1000 MG tablet Take 1,000 mg by mouth 2 (two) times daily.   Unknown   lisinopril (ZESTRIL) 10 MG tablet Take 1 tablet (10 mg total) by mouth daily. 10 tablet 0 Unknown   nicotine polacrilex (NICORETTE) 2 MG gum Take 1 each (2 mg total) by mouth as needed for smoking cessation. 100 tablet 0 Unknown   pantoprazole (PROTONIX) 20 MG tablet Take 1 tablet (20 mg total) by mouth daily. 30 tablet 0 Unknown   polyethylene glycol (MIRALAX / GLYCOLAX) 17 g packet Take 17 g by mouth daily. 30 packet 0 Unknown   risperiDONE (RISPERDAL) 2 MG tablet Take 1 tablet (2 mg total) by mouth at bedtime. (Patient taking differently: Take 1-3 mg by mouth 2 (two) times daily. Take 1 mg (one-half tablet) by  mouth every morning and 3 mg (one and one-half tablets) at bedtime) 30 tablet 0 Unknown   traZODone (DESYREL) 150 MG tablet Take 1 tablet (150 mg total) by mouth at bedtime. 30 tablet 0 Unknown   vitamin D3 (CHOLECALCIFEROL) 25 MCG tablet Take 1 tablet (1,000 Units total) by mouth daily. 60 tablet 0 Unknown    Patient Stressors:    Patient Strengths:    Treatment Modalities: Medication Management, Group therapy, Case management,  1 to 1 session with clinician, Psychoeducation, Recreational therapy.   Physician Treatment Plan for Primary Diagnosis: <principal problem not specified> Long Term Goal(s): Improvement in symptoms so as ready for discharge start rehab services continue with rehab services   Short Term Goals: Ability to  identify changes in lifestyle to reduce recurrence of condition will improve Ability to verbalize feelings will improve Ability to disclose and discuss suicidal ideas Ability to demonstrate self-control will improve Ability to identify and develop effective coping behaviors will improve Ability to maintain clinical measurements within normal limits will improve Compliance with prescribed medications will improve Ability to identify triggers associated with substance abuse/mental health issues will improve  Medication Management: Evaluate patient's response, side effects, and tolerance of medication regimen.  Therapeutic Interventions: 1 to 1 sessions, Unit Group sessions and Medication administration.  Evaluation of Outcomes: Progressing  Physician Treatment Plan for Secondary Diagnosis: Active Problems:   MDD (major depressive disorder), recurrent episode, severe (HCC)   MDD (major depressive disorder)   Substance abuse (HCC)  Long Term Goal(s): Improvement in symptoms so as ready for discharge start rehab services continue with rehab services   Short Term Goals: Ability to identify changes in lifestyle to reduce recurrence of condition will improve Ability to verbalize feelings will improve Ability to disclose and discuss suicidal ideas Ability to demonstrate self-control will improve Ability to identify and develop effective coping behaviors will improve Ability to maintain clinical measurements within normal limits will improve Compliance with prescribed medications will improve Ability to identify triggers associated with substance abuse/mental health issues will improve     Medication Management: Evaluate patient's response, side effects, and tolerance of medication regimen.  Therapeutic Interventions: 1 to 1 sessions, Unit Group sessions and Medication administration.  Evaluation of Outcomes: Progressing   RN Treatment Plan for Primary Diagnosis: <principal problem not  specified> Long Term Goal(s): Knowledge of disease and therapeutic regimen to maintain health will improve  Short Term Goals: Ability to remain free from injury will improve, Ability to verbalize frustration and anger appropriately will improve, Ability to demonstrate self-control, Ability to participate in decision making will improve, Ability to verbalize feelings will improve, Ability to disclose and discuss suicidal ideas, Ability to identify and develop effective coping behaviors will improve, and Compliance with prescribed medications will improve  Medication Management: RN will administer medications as ordered by provider, will assess and evaluate patient's response and provide education to patient for prescribed medication. RN will report any adverse and/or side effects to prescribing provider.  Therapeutic Interventions: 1 on 1 counseling sessions, Psychoeducation, Medication administration, Evaluate responses to treatment, Monitor vital signs and CBGs as ordered, Perform/monitor CIWA, COWS, AIMS and Fall Risk screenings as ordered, Perform wound care treatments as ordered.  Evaluation of Outcomes: Progressing   LCSW Treatment Plan for Primary Diagnosis: <principal problem not specified> Long Term Goal(s): Safe transition to appropriate next level of care at discharge, Engage patient in therapeutic group addressing interpersonal concerns.  Short Term Goals: Engage patient in aftercare planning with referrals and resources, Increase  social support, Increase ability to appropriately verbalize feelings, Increase emotional regulation, Facilitate acceptance of mental health diagnosis and concerns, Facilitate patient progression through stages of change regarding substance use diagnoses and concerns, Identify triggers associated with mental health/substance abuse issues, and Increase skills for wellness and recovery  Therapeutic Interventions: Assess for all discharge needs, 1 to 1 time with  Social worker, Explore available resources and support systems, Assess for adequacy in community support network, Educate family and significant other(s) on suicide prevention, Complete Psychosocial Assessment, Interpersonal group therapy.  Evaluation of Outcomes: Progressing   Progress in Treatment: Attending groups: Yes. Participating in groups: Yes. Taking medication as prescribed: Yes. Toleration medication: Yes. Family/Significant other contact made: Yes, individual(s) contacted:  contact attempts made to reach brother, Koty Anctil. Patient understands diagnosis: Yes. Discussing patient identified problems/goals with staff: Yes. Medical problems stabilized or resolved: Yes. Denies suicidal/homicidal ideation: Yes. Issues/concerns per patient self-inventory: No. Other: none.  New problem(s) identified: No, Describe:  None   New Short Term/Long Term Goal(s): detox, elimination of symptoms of psychosis, medication management for mood stabilization; elimination of SI thoughts; development of comprehensive mental wellness/sobriety plan. 09/09/23 Update: No changes at this time.     Patient Goals:  "I just want to get better but it's hard." 09/09/23 Update: No changes at this time.   Discharge Plan or Barriers: CSW to assist in the development of appropriate discharge plan. 09/09/23 Update: No changes at this time.  Reason for Continuation of Hospitalization: Anxiety Depression Medication stabilization Suicidal ideation  Estimated Length of Stay: 1-7 days  Last 3 [place] Suicide Severity Risk Score: Flowsheet Row Admission (Current) from 09/03/2023 in [facility] ED from 09/02/2023 in [facility] at [facility] ED from 08/30/2023 in [facility] Emergency Department at [facility]  C-SSRS RISK CATEGORY High Risk High Risk Error: Q3, 4, or 5 should not be populated when Q2 is No       Last PHQ 2/9 Scores:     No data to display           Scribe for Treatment Team: Glenis Smoker, LCSW 09/09/2023 12:52 PM

## 2023-09-09 NOTE — Group Note (Signed)
 Date:  09/09/2023 Time:  4:30 PM  Group Topic/Focus:  Wellness Toolbox:   The focus of this group is to discuss various aspects of wellness, balancing those aspects and exploring ways to increase the ability to experience wellness.  Patients will create a wellness toolbox for use upon discharge.    Participation Level:  Active  Participation Quality:  Appropriate  Affect:  Appropriate  Cognitive:  Appropriate  Insight: Appropriate  Engagement in Group:  Engaged  Modes of Intervention:  Activity  Additional Comments:    Raymond [name] Raymond [name] 09/09/2023, 4:30 PM

## 2023-09-09 NOTE — Plan of Care (Signed)
?  Problem: Education: ?Goal: Knowledge of  General Education information/materials will improve ?Outcome: Progressing ?Goal: Emotional status will improve ?Outcome: Progressing ?Goal: Mental status will improve ?Outcome: Progressing ?Goal: Verbalization of understanding the information provided will improve ?Outcome: Progressing ?  ?Problem: Activity: ?Goal: Interest or engagement in activities will improve ?Outcome: Progressing ?Goal: Sleeping patterns will improve ?Outcome: Progressing ?  ?Problem: Coping: ?Goal: Ability to verbalize frustrations and anger appropriately will improve ?Outcome: Progressing ?Goal: Ability to demonstrate self-control will improve ?Outcome: Progressing ?  ?Problem: Health Behavior/Discharge Planning: ?Goal: Identification of resources available to assist in meeting health care needs will improve ?Outcome: Progressing ?Goal: Compliance with treatment plan for underlying cause of condition will improve ?Outcome: Progressing ?  ?Problem: Safety: ?Goal: Periods of time without injury will increase ?Outcome: Progressing ?  ?

## 2023-09-09 NOTE — Progress Notes (Signed)
 [facility] MD Progress Note  09/09/2023 2:48 PM Raymond [name]  MRN:  [id number]   Raymond [name] is a 43 year-old male when presented to the emergency department  yesterday via EMS on 09/02/2023 after he reportedly took 70 pills of all his medication regimen.  He reports that he did that because he was feeling overwhelmed and wanting to kill himself. He also reports that he had been drinking alcohol increasingly to the point of losing awareness. Patient presented to the ED in intoxication state and his alcohol level was 283. His UDS showed cannabinoids in his system. Psych consult was ordered and  inpatient treatment was recommended.  Per chart review, patient was recently  evaluated at [facility] Emergency Department on 08/30/2023 due to suicidal threats. Upon discharge, patient was recommended to follow up with PCP.  On 08/09/2023, patient was admitted to [facility] under Dr Sherron Flemings for worsening depression, anxiety and panic attacks along with suicidal ideations. He reported that he had overdosed on 90 pills of 500 mg Kepra and Trazodone. His medications were adjusted  and, upon discharge, patient was to follow up at [facility] and appointment was scheduled.  Today patient reports the same symptoms although he reports that he has community services . He reports that [name] from his ACT team has been coming to see him. Chart review indicates that patient has had multiple visits to the ED  in recent months, and many  resulting into inpatient hospitalizations. Patient reports that he also goes to the [place] for medication management. He reports having alcohol problem and requests treatment.    Subjective: Patient's case is discussed with multidisciplinary team, all vitals and notes were reviewed.  No reported behavioral issues overnight.  Patient is seen for reassessment, reports that he is doing okay, continues to report that he is lonely at his home because his mother passed away 3 years ago, and his children are not  close to him.  Did discuss with him possibility of moving to an independent living facility or group home where he will have other individuals in the environment, at this time he is not agreeable.  Relaying that it took him a couple years to get his current apartment, and that he has a lot of friends who are supportive, that he invites them over, and that he goes to other programs throughout the day where he can interact with others.  States that he continues to have difficulty sleeping at night however this is normal for him and he only sleeps about 3 to 4 hours.  Reports good appetite.  Continues to endorse depression which he rates as a 5/10, anxiety/10.  Denies SI/HI and AVH.  Denies any medication side effects.  He does continue to minimize alcohol use, stating that he does not drink often, and he does not drink much.   Principal Problem: <principal problem not specified> Diagnosis: Active Problems:   MDD (major depressive disorder), recurrent episode, severe (HCC)   MDD (major depressive disorder)   Substance abuse (HCC)  Total Time spent with patient: 45 minutes  Past Psychiatric History:  Previous Psych Diagnoses: Major depressive disorder, recurrent severe without psychotic features, amphetamine abuse, cocaine abuse, substance-induced mood disorder, with suicide attempt.  Prior inpatient treatment: Admitted to [facility] behavioral health in [place] in 2020 for suicide attempt for 1 month, most recently [facility] 08/2023 Current/prior outpatient treatment: ACT team  Prior rehab hx: Rehab for alcohol x 1 month in 2019 Psychotherapy hx: Yes History of suicide: multiple attempts History of  homicide or aggression: Denies Psychiatric medication history: Thorazine, fluoxetine, hydroxyzine, risperidone, and trazodone. Psychiatric medication compliance history: Noncompliance Current Psychiatrist: ACT team  Current therapist: Seeing Graciella Freer at the [facility]  Past Medical History:   Past Medical History:  Diagnosis Date   Alcohol abuse    Anxiety    Hepatitis C    PTSD (post-traumatic stress disorder)     Past Surgical History:  Procedure Laterality Date   FACIAL FRACTURE SURGERY     metal plate under right eye   Family History: History reviewed. No pertinent family history. Family Psychiatric  History: unknown  Social History:  Social History   Substance and Sexual Activity  Alcohol Use Yes   Comment: 1 liter bottle of vodka prior to arrival     Social History   Substance and Sexual Activity  Drug Use Yes   Types: Methamphetamines    Social History   Socioeconomic History   Marital status: Single    Spouse name: Not on file   Number of children: Not on file   Years of education: Not on file   Highest education level: Not on file  Occupational History   Not on file  Tobacco Use   Smoking status: Every Day    Current packs/day: 1.00    Types: Cigarettes    Passive exposure: Current   Smokeless tobacco: Never  Vaping Use   Vaping status: Never Used  Substance and Sexual Activity   Alcohol use: Yes    Comment: 1 liter bottle of vodka prior to arrival   Drug use: Yes    Types: Methamphetamines   Sexual activity: Not Currently  Other Topics Concern   Not on file  Social History Narrative   Not on file   Social Drivers of Health   Financial Resource Strain: Not on File (08/18/2022)   Received from [employer] [facility] [employer]    Financial Resource Strain: 0  Food Insecurity: No Food Insecurity (09/03/2023)   Hunger Vital Sign    Worried About Running Out of Food in the Last Year: Never true    Ran Out of Food in the Last Year: Never true  Transportation Needs: Unmet Transportation Needs (09/03/2023)   PRAPARE - Administrator, Civil Service (Medical): Yes    Lack of Transportation (Non-Medical): Yes  Physical Activity: Not on File (08/18/2022)   Received from [place], [place]   Physical Activity    Physical  Activity: 0  Stress: Not on File (08/18/2022)   Received from [facility] [place]   Stress    Stress: 0  Social Connections: Not on File (02/15/2023)   Received from [employer]   Social Connections    Connectedness: 0   Additional Social History:                         Sleep: Poor  Appetite:  Fair  Current Medications: Current Facility-Administered Medications  Medication Dose Route Frequency Provider Last Rate Last Admin   alum & mag hydroxide-simeth (MAALOX/MYLANTA) 200-200-20 MG/5ML suspension 30 mL  30 mL Oral Q4H PRN Penn, Cicely, NP       FLUoxetine (PROZAC) capsule 60 mg  60 mg Oral Daily Rayburn Go, Veronique M, NP   60 mg at 09/09/23 0843   levETIRAcetam (KEPPRA) tablet 1,000 mg  1,000 mg Oral BID Onuoha, Chinwendu V, NP   1,000 mg at 09/09/23 0843   melatonin tablet 5 mg  5 mg Oral QHS Saleem Coccia,  Judeth Cornfield, PA-C   5 mg at 09/08/23 2104   multivitamin with minerals tablet 1 tablet  1 tablet Oral Daily Marlou Sa, NP   1 tablet at 09/09/23 5366   nicotine polacrilex (NICORETTE) gum 2 mg  2 mg Oral PRN Verner Chol, MD   2 mg at 09/05/23 1406   OLANZapine (ZYPREXA) injection 10 mg  10 mg Intramuscular TID PRN Mcneil Sober, NP       OLANZapine (ZYPREXA) injection 5 mg  5 mg Intramuscular TID PRN Mcneil Sober, NP       OLANZapine zydis (ZYPREXA) disintegrating tablet 5 mg  5 mg Oral TID PRN Mcneil Sober, NP   5 mg at 09/05/23 1811   risperiDONE (RISPERDAL) tablet 2 mg  2 mg Oral QHS Rayburn Go, Veronique M, NP   2 mg at 09/08/23 2104   thiamine (VITAMIN B1) tablet 100 mg  100 mg Oral Daily Verner Chol, MD   100 mg at 09/09/23 0843   traZODone (DESYREL) tablet 200 mg  200 mg Oral QHS Duanne Duchesne, PA-C        Lab Results:  No results found for this or any previous visit (from the past 48 hours).   Blood Alcohol level:  Lab Results  Component Value Date   ETH 283 (H) 09/02/2023   ETH 142 (H) 08/30/2023    Metabolic Disorder Labs: Lab Results   Component Value Date   HGBA1C 5.3 08/11/2023   MPG 105.41 08/11/2023   MPG 108.28 03/28/2023   Lab Results  Component Value Date   PROLACTIN 38.0 (H) 09/06/2023   PROLACTIN 12.0 01/22/2023   Lab Results  Component Value Date   CHOL 172 08/11/2023   TRIG 146 08/11/2023   HDL 53 08/11/2023   CHOLHDL 3.2 08/11/2023   VLDL 29 08/11/2023   LDLCALC 90 08/11/2023   LDLCALC 84 03/28/2023    Physical Findings: AIMS:  , ,  ,  ,    CIWA:  CIWA-Ar Total: 0 COWS:     Musculoskeletal: Strength & Muscle Tone: within normal limits Gait & Station: normal Patient leans: N/A  Psychiatric Specialty Exam:  Presentation  General Appearance:  Casual  Eye Contact: Minimal (diminished, avoiding)  Speech: Clear and Coherent  Speech Volume: Normal  Handedness: Right   Mood and Affect  Mood: Anxious; Depressed  Affect: Congruent   Thought Process  Thought Processes: Coherent  Descriptions of Associations:Intact  Orientation:Full (Time, Place and Person)  Thought Content:WDL  History of Schizophrenia/Schizoaffective disorder:No  Duration of Psychotic Symptoms:No data recorded Hallucinations:No data recorded  Ideas of Reference:None  Suicidal Thoughts:No data recorded  Homicidal Thoughts:No data recorded   Sensorium  Memory: Immediate Fair; Recent Fair; Remote Fair  Judgment: Fair  Insight: Fair   Art therapist  Concentration: Fair  Attention Span: Fair  Recall: [employer] of Knowledge: Fair  Language: Fair   Psychomotor Activity  Psychomotor Activity: No data recorded   Assets  Assets: Communication Skills; Desire for Improvement; Physical Health   Sleep  Sleep: No data recorded    Physical Exam: Physical Exam Vitals and nursing note reviewed.  Constitutional:      Appearance: Normal appearance.  HENT:     Head: Normocephalic and atraumatic.  Eyes:     Extraocular Movements: Extraocular movements intact.   Pulmonary:     Effort: Pulmonary effort is normal.  Musculoskeletal:     Cervical back: Normal range of motion.  Skin:    General: Skin is dry.  Neurological:  Mental Status: He is alert and oriented to person, place, and time. Mental status is at baseline.  Psychiatric:        Attention and Perception: Attention and perception normal.        Mood and Affect: Mood is anxious and depressed. Affect is blunt.        Speech: Speech normal.        Behavior: Behavior normal. Behavior is cooperative.        Thought Content: Thought content normal.        Cognition and Memory: Cognition and memory normal.        Judgment: Judgment is impulsive.    Review of Systems  Neurological:  Positive for seizures.  Psychiatric/Behavioral:  Positive for depression and substance abuse. The patient is nervous/anxious and has insomnia.   All other systems reviewed and are negative.  Blood pressure (!) 122/92, pulse 85, temperature 97.9 F (36.6 C), resp. rate 20, SpO2 97%. There is no height or weight on file to calculate BMI.   Treatment Plan Summary:   Safety and Monitoring:             -- Voluntary admission to inpatient psychiatric unit for safety, stabilization and treatment             -- Daily contact with patient to assess and evaluate symptoms and progress in treatment             -- Patient's case to be discussed in multi-disciplinary team meeting             -- Observation Level: q15 minute checks             -- Vital signs:  q12 hours             -- Precautions: suicide, elopement, and assault   2. Psychiatric Diagnoses and Treatment:                09/07/23: Pt continues to endorse significant depressed mood, difficulties sleeping reduced appetite. Will continue to monitor for further treatment and stabilization  Continue Risperdal 2 mg HS for mood stabilization/VH Continue Prozac 60 mg daily for depressive sxs  Continue Trazodone 150 mg HS insomnia Start melatonin 5 mg HS for  sleep   -- The risks/benefits/side-effects/alternatives to this medication were discussed in detail with the patient and time was given for questions. The patient consents to medication trial.                -- Metabolic profile and EKG monitoring obtained while on an atypical antipsychotic (BMI:22.45 Lipid Panel:WNL HbgA1c:5.3 QTc:432)              -- Encouraged patient to participate in unit milieu and in scheduled group therapies                            3. Medical Issues Being Addressed:    Hx of seizures  -- Continue keppra 1000 mg BID  -- 4/6 Neurology consulted for suspected breakthrough seizure.  Ordered Keppra level.  Also ordered  lactic acid and  prolactin level. -- 4/7 f/u with Neuro re: pt reported unwitnessed seizures, yet to be seen, on call Neurologist reports will see pt tomorrow. Lactic acid 1.3 Wnl, Keppra and prolactin pending  -- 4/8 Nuerologist recommendations to switch from keppra to Vimpat as keppra could be contributing to depressive sxs.    Alcohol abuse   Continue CIWA protocol, Librium  taper               Nicotine dependence  -- Continue Nicotine gum 2 mg PRN  -- encouraged smoking cessation   4. Discharge Planning:              -- Social work and case management to assist with discharge planning and identification of hospital follow-up needs prior to discharge             -- Estimated LOS: 5-7 days             -- Discharge Concerns: Need to establish a safety plan; Medication compliance and effectiveness             -- Discharge Goals: Return home with outpatient referrals follow ups   Physician Treatment Plan for Primary Diagnosis: MDD (major depressive disorder), recurrent severe, without psychosis (HCC) Long Term Goal(s): Improvement in symptoms so as ready for discharge   Short Term Goals: Ability to identify changes in lifestyle to reduce recurrence of condition will improve, Ability to verbalize feelings will improve, Ability to disclose and  discuss suicidal ideas, Ability to demonstrate self-control will improve, and Ability to identify and develop effective coping behaviors will improve   Physician Treatment Plan for Secondary Diagnosis: Principal Problem:   MDD (major depressive disorder), recurrent severe, without psychosis (HCC)   Long Term Goal(s): Improvement in symptoms so as ready for discharge   Short Term Goals: Ability to identify changes in lifestyle to reduce recurrence of condition will improve, Ability to verbalize feelings will improve, Ability to disclose and discuss suicidal ideas, Ability to demonstrate self-control will improve, Ability to identify and develop effective coping behaviors will improve, and Ability to maintain clinical measurements within normal limits will improve  Krrish Freund, PA-C 09/09/2023, 2:48 PM

## 2023-09-09 NOTE — Group Note (Signed)
 Date:  09/09/2023 Time:  10:26 AM  Group Topic/Focus:  Goals Group:   The focus of this group is to help patients establish daily goals to achieve during treatment and discuss how the patient can incorporate goal setting into their daily lives to aide in recovery. Healthy Communication:   The focus of this group is to discuss communication, barriers to communication, as well as healthy ways to communicate with others.    Participation Level:  Active  Participation Quality:  Appropriate  Affect:  Appropriate  Cognitive:  Appropriate  Insight: Appropriate  Engagement in Group:  Engaged  Modes of Intervention:  Discussion and Education  Additional Comments:    Wilford Corner 09/09/2023, 10:26 AM

## 2023-09-09 NOTE — Group Note (Signed)
 Date:  09/09/2023 Time:  10:17 AM  Group Topic/Focus:  Goals Group:   The focus of this group is to help patients establish daily goals to achieve during treatment and discuss how the patient can incorporate goal setting into their daily lives to aide in recovery.    Participation Level:  Active  Participation Quality:  Appropriate  Affect:  Appropriate  Cognitive:  Appropriate  Insight: Appropriate  Engagement in Group:  Engaged  Modes of Intervention:  Discussion, Education, and Support  Additional Comments:    Wilford Corner 09/09/2023, 10:17 AM

## 2023-09-09 NOTE — Group Note (Signed)
 [facility] LCSW Group Therapy Note   Group Date: 09/09/2023 Start Time: 1310 End Time: 1400   Type of Therapy/Topic:  Group Therapy:  Emotion Regulation  Participation Level:  Active    Description of Group:    The purpose of this group is to assist patients in learning to regulate negative emotions and experience positive emotions. Patients will be guided to discuss ways in which they have been vulnerable to their negative emotions. These vulnerabilities will be juxtaposed with experiences of positive emotions or situations, and patients challenged to use positive emotions to combat negative ones. Special emphasis will be placed on coping with negative emotions in conflict situations, and patients will process healthy conflict resolution skills.  Therapeutic Goals: Patient will identify two positive emotions or experiences to reflect on in order to balance out negative emotions:  Patient will label two or more emotions that they find the most difficult to experience:  Patient will be able to demonstrate positive conflict resolution skills through discussion or role plays:   Summary of Patient Progress: Patient was present for the entirety of the group process. Anxiety was identified as a strong emotion that he was feeling prior to entering the hospital. Pt shared that in the past he was arguing with his brother and he decided to take a walk instead. He stated that he was able to take this time away to process, and then go back to his brother to apologize. Pt appeared open and receptive to feedback/comments from both his peers and facilitator.    Therapeutic Modalities:   Cognitive Behavioral Therapy Feelings Identification Dialectical Behavioral Therapy   Glenis Smoker, LCSW

## 2023-09-09 NOTE — [facility] Counselor (Signed)
 CSW touched base with patient's ACTT team [facility] AT [phone] to confirm patient's discharge and discuss transportation.   [facility] ACTT team may be able to provide transportation.   Representative confirmed that she would touch base with CSW team with an answer.   CSW team to continue to assess.   Reymundo Poll, MSW, LCSWA 09/09/2023 3:03 PM

## 2023-09-09 NOTE — Consult Note (Signed)
 NEUROLOGY CONSULTATION NOTE   Date of service: September 09, 2023 Patient Name: Raymond [name] MRN:  [id number] DOB:  1980/09/19 Reason for consult: breakthrough seizure Requesting physician: Dr. Verner Chol _ _ _   _ __   _ __ _ _  __ __   _ __   __ _  History of Present Illness   This is a 43 yo man with pmhx significant for EtOH abuse, anxiety, hep C, and PTSD on whom neurology is consulted for breakthrough seizures.  Patient has had seizures since a traumatic brain injury when he was in the [employer] approximately 20 years ago.  He has had grand mal seizures in the past and also has seizures where he is unable to speak or move.  He typically feels dizzy and has a headache afterwards and sometimes urinary incontinence.  These most commonly occur at night.  He is admitted to the behavioral health unit for suicide attempt including all of his prescription medications 1 of which was his home AED Keppra.  He states that when he is on the Keppra and makes his depression worse.  He also reports that he has had breakthrough seizures multiple night since admission.  He has had incontinence with 1 of these episodes and in all episodes aroused from sleep with a headache feeling dizzy incoordinated and unable to properly alert the nurses with his call bell.   ROS   Per HPI: all other systems reviewed and are negative  Past History   I have reviewed the following:  Past Medical History:  Diagnosis Date   Alcohol abuse    Anxiety    Hepatitis C    PTSD (post-traumatic stress disorder)    Past Surgical History:  Procedure Laterality Date   FACIAL FRACTURE SURGERY     metal plate under right eye   History reviewed. No pertinent family history. Social History   Socioeconomic History   Marital status: Single    Spouse name: Not on file   Number of children: Not on file   Years of education: Not on file   Highest education level: Not on file  Occupational History   Not on file  Tobacco Use    Smoking status: Every Day    Current packs/day: 1.00    Types: Cigarettes    Passive exposure: Current   Smokeless tobacco: Never  Vaping Use   Vaping status: Never Used  Substance and Sexual Activity   Alcohol use: Yes    Comment: 1 liter bottle of vodka prior to arrival   Drug use: Yes    Types: Methamphetamines   Sexual activity: Not Currently  Other Topics Concern   Not on file  Social History Narrative   Not on file   Social Drivers of Health   Financial Resource Strain: Not on File (08/18/2022)   Received from [facility]    Financial Resource Strain: 0  Food Insecurity: No Food Insecurity (09/03/2023)   Hunger Vital Sign    Worried About Running Out of Food in the Last Year: Never true    Ran Out of Food in the Last Year: Never true  Transportation Needs: Unmet Transportation Needs (09/03/2023)   PRAPARE - Transportation    Lack of Transportation (Medical): Yes    Lack of Transportation (Non-Medical): Yes  Physical Activity: Not on File (08/18/2022)   Received from [place], [place]   Physical Activity    Physical Activity: 0  Stress: Not on File (08/18/2022)  Received from [place], [place]   Stress    Stress: 0  Social Connections: Not on File (02/15/2023)   Received from [facility]   Social Connections    Connectedness: 0   Allergies  Allergen Reactions   Peanut Butter Flavor [Flavoring Agent]    Other Itching, Rash and Other (See Comments)    Peanuts   Tylenol [Acetaminophen] Rash    Medications   Medications Prior to Admission  Medication Sig Dispense Refill Last Dose/Taking   albuterol (VENTOLIN HFA) 108 (90 Base) MCG/ACT inhaler Inhale 2 puffs into the lungs every 4 (four) hours as needed for wheezing or shortness of breath. 6.7 g 0 Unknown   cloNIDine (CATAPRES) 0.1 MG tablet Take 1 tablet (0.1 mg total) by mouth 2 (two) times daily. (Patient not taking: Reported on 09/03/2023) 60 tablet 11 Unknown   docusate sodium (COLACE) 100 MG  capsule Take 1 capsule (100 mg total) by mouth 2 (two) times daily. 10 capsule 0 Unknown   levETIRAcetam (KEPPRA) 1000 MG tablet Take 1,000 mg by mouth 2 (two) times daily.   Unknown   lisinopril (ZESTRIL) 10 MG tablet Take 1 tablet (10 mg total) by mouth daily. 10 tablet 0 Unknown   pantoprazole (PROTONIX) 20 MG tablet Take 1 tablet (20 mg total) by mouth daily. 30 tablet 0 Unknown   polyethylene glycol (MIRALAX / GLYCOLAX) 17 g packet Take 17 g by mouth daily. 30 packet 0 Unknown   traZODone (DESYREL) 150 MG tablet Take 1 tablet (150 mg total) by mouth at bedtime. 30 tablet 0 Unknown   [DISCONTINUED] FLUoxetine (PROZAC) 20 MG capsule Take 3 capsules (60 mg total) by mouth daily. 90 capsule 3 Unknown   [DISCONTINUED] nicotine polacrilex (NICORETTE) 2 MG gum Take 1 each (2 mg total) by mouth as needed for smoking cessation. 100 tablet 0 Unknown   [DISCONTINUED] risperiDONE (RISPERDAL) 2 MG tablet Take 1 tablet (2 mg total) by mouth at bedtime. (Patient taking differently: Take 1-3 mg by mouth 2 (two) times daily. Take 1 mg (one-half tablet) by mouth every morning and 3 mg (one and one-half tablets) at bedtime) 30 tablet 0 Unknown   [DISCONTINUED] vitamin D3 (CHOLECALCIFEROL) 25 MCG tablet Take 1 tablet (1,000 Units total) by mouth daily. 60 tablet 0 Unknown      Current Facility-Administered Medications:    alum & mag hydroxide-simeth (MAALOX/MYLANTA) 200-200-20 MG/5ML suspension 30 mL, 30 mL, Oral, Q4H PRN, Penn, Cicely, NP   FLUoxetine (PROZAC) capsule 60 mg, 60 mg, Oral, Daily, Byungura, Veronique M, NP, 60 mg at 09/09/23 5366   lacosamide (VIMPAT) 200 mg in sodium chloride 0.9 % 25 mL IVPB, 200 mg, Intravenous, Once **FOLLOWED BY** [START ON 09/10/2023] lacosamide (VIMPAT) tablet 100 mg, 100 mg, Oral, BID, Jefferson Fuel, MD   Melene Muller ON 09/10/2023] levETIRAcetam (KEPPRA) tablet 500 mg, 500 mg, Oral, BID, Jefferson Fuel, MD   melatonin tablet 5 mg, 5 mg, Oral, QHS, Tingling, Stephanie, PA-C, 5  mg at 09/08/23 2104   multivitamin with minerals tablet 1 tablet, 1 tablet, Oral, Daily, Rayburn Go, Veronique M, NP, 1 tablet at 09/09/23 4403   nicotine polacrilex (NICORETTE) gum 2 mg, 2 mg, Oral, PRN, Verner Chol, MD, 2 mg at 09/05/23 1406   OLANZapine (ZYPREXA) injection 10 mg, 10 mg, Intramuscular, TID PRN, Penn, Cicely, NP   OLANZapine (ZYPREXA) injection 5 mg, 5 mg, Intramuscular, TID PRN, Penn, Cicely, NP   OLANZapine zydis (ZYPREXA) disintegrating tablet 5 mg, 5 mg, Oral, TID PRN, Penn, Cranston Neighbor, NP, 5  mg at 09/05/23 1811   risperiDONE (RISPERDAL) tablet 2 mg, 2 mg, Oral, QHS, Byungura, Veronique M, NP, 2 mg at 09/08/23 2104   thiamine (VITAMIN B1) tablet 100 mg, 100 mg, Oral, Daily, Verner Chol, MD, 100 mg at 09/09/23 0843   traZODone (DESYREL) tablet 200 mg, 200 mg, Oral, QHS, Tingling, Stephanie, PA-C  Vitals   Vitals:   09/08/23 1651 09/08/23 1653 09/09/23 0621 09/09/23 1653  BP: 120/85 118/77 (!) 122/92 (!) 121/92  Pulse: 87 (!) 108 85 99  Resp:   20   Temp: 98.1 F (36.7 C) (!) 97.3 F (36.3 C) 97.9 F (36.6 C) (!) 97 F (36.1 C)  TempSrc:      SpO2: 99% 99% 97% 97%     There is no height or weight on file to calculate BMI.  Physical Exam   Physical Exam Gen: A&O x4, NAD HEENT: Atraumatic, normocephalic;mucous membranes moist; oropharynx clear, tongue without atrophy or fasciculations. Neck: Supple, trachea midline. Resp: CTAB, no w/r/r CV: RRR, no m/g/r; nml S1 and S2. 2+ symmetric peripheral pulses. Abd: soft/NT/ND; nabs x 4 quad Extrem: Nml bulk; no cyanosis, clubbing, or edema.  Neuro: *MS: A&O x4. Follows multi-step commands.  *Speech: fluid, nondysarthric, able to name and repeat *CN:    I: Deferred   II,III: PERRLA, VFF by confrontation, optic discs unable to be visualized 2/2 pupillary constriction   III,IV,VI: EOMI w/o nystagmus, no ptosis   V: Sensation intact from V1 to V3 to LT   VII: Eyelid closure was full.  Smile symmetric.   VIII:  Hearing intact to voice   IX,X: Voice normal, palate elevates symmetrically    XI: SCM/trap 5/5 bilat   XII: Tongue protrudes midline, no atrophy or fasciculations   *Motor:   Normal bulk.  No tremor, rigidity or bradykinesia. No pronator drift.    Strength: Dlt Bic Tri WrE WrF FgS Gr HF KnF KnE PlF DoF    Left 5 5 5 5 5 5 5 5 5 5 5 5     Right 5 5 5 5 5 5 5 5 5 5 5 5     *Sensory: Intact to light touch, pinprick, temperature vibration throughout. Symmetric. Propioception intact bilat.  No double-simultaneous extinction.  *Coordination:  Finger-to-nose, heel-to-shin, rapid alternating motions were intact. *Reflexes:  2+ and symmetric throughout without clonus; toes down-going bilat *Gait: normal base, normal stride, normal turn. Negative Romberg.    Labs   CBC: No results for input(s): "WBC", "NEUTROABS", "HGB", "HCT", "MCV", "PLT" in the last 168 hours.  Basic Metabolic Panel:  Lab Results  Component Value Date   NA 137 09/02/2023   K 3.9 09/02/2023   CO2 22 09/02/2023   GLUCOSE 102 (H) 09/02/2023   BUN 10 09/02/2023   CREATININE 0.68 09/02/2023   CALCIUM 8.5 (L) 09/02/2023   GFRNONAA >60 09/02/2023   Lipid Panel:  Lab Results  Component Value Date   LDLCALC 90 08/11/2023   HgbA1c:  Lab Results  Component Value Date   HGBA1C 5.3 08/11/2023   Urine Drug Screen:     Component Value Date/Time   LABOPIA NONE DETECTED 09/02/2023 1930   LABOPIA NONE DETECTED 08/08/2023 2050   COCAINSCRNUR NONE DETECTED 09/02/2023 1930   LABBENZ NONE DETECTED 09/02/2023 1930   LABBENZ NONE DETECTED 08/08/2023 2050   AMPHETMU NONE DETECTED 09/02/2023 1930   AMPHETMU NONE DETECTED 08/08/2023 2050   THCU POSITIVE (A) 09/02/2023 1930   THCU POSITIVE (A) 08/08/2023 2050   LABBARB NONE DETECTED 09/02/2023  1930   LABBARB NONE DETECTED 08/08/2023 2050    Alcohol Level     Component Value Date/Time   ETH 283 (H) 09/02/2023 1547    No imaging this admission  Impression   This is a  43 yo man with pmhx significant for EtOH abuse, anxiety, hep C, and PTSD on whom neurology is consulted for breakthrough seizures.  Patient is admitted for intentional overdose of all of his prescription medications in the setting of severe depression on the anniversary of his mother's death. Keppra is not a good medication for him bc it can certainly worsen depression and suicidal ideation.  He has been on 1 other seizure medication in the past that he said was helpful but he does not remember the name of it.  Those records are in the [place] system and I am unable to access them at this time.  Depakote was considered as an ED as this would give him mood stabilization however given his history of alcohol abuse and after discussion with the psychiatry team this is not felt to be a good option at this time.  Therefore recommend Vimpat with an IV load tonight to be continued at 100 mg twice daily after that.  Vimpat is a good option for his focal seizures and in addition as it is a controlled substance and without any abuse potential can limit the potential harm of intentional overdose because you can only receive 30 pills/month.  Again there is no abuse potential of vimpat.  Recommendations   - Vimpat IV load 200mg  now f/b 100mg  po bid after that - Decrease keppra to 500mg  bid starting tomorrow x5 days then stop keppra - Outpatient neurology follow up should be arranged by [facility] or by ambulatory referral at hospital discharge  Please call with any further questions.   ______________________________________________________________________   Thank you for the opportunity to take part in the care of this patient. If you have any further questions, please contact the neurology consultation attending.  Signed,  Bing Neighbors, MD [facility] [phone]  If 7pm- 7am, please page neurology on call as listed in AMION.  **Any copied and pasted documentation in this note was written  by me in another application not billed for and pasted by me into this document.

## 2023-09-10 ENCOUNTER — Other Ambulatory Visit: Payer: Self-pay

## 2023-09-10 ENCOUNTER — Encounter: Payer: Self-pay | Admitting: Psychiatry

## 2023-09-10 MED ORDER — LEVETIRACETAM 500 MG PO TABS
500.0000 mg | ORAL_TABLET | Freq: Two times a day (BID) | ORAL | 0 refills | Status: DC
  Filled 2023-09-10: qty 9, 5d supply, fill #0

## 2023-09-10 MED ORDER — LACOSAMIDE 100 MG PO TABS: 100.0000 mg | ORAL_TABLET | Freq: Two times a day (BID) | ORAL | 0 refills | Status: DC

## 2023-09-10 NOTE — Progress Notes (Signed)
  [facility] Adult Case Management Discharge Plan :  Will you be returning to the same living situation after discharge:  Yes,  Patient to return home.  At discharge, do you have transportation home?: Yes,  Patient to be transported by his ACTT team.  Do you have the ability to pay for your medications: Yes,  BLUE CROSS BLUE SHIELD / BCBS OTHER  Release of information consent forms completed and in the chart;  Patient's signature needed at discharge.  Patient to Follow up at:  Follow-up Information     [facility] [place] [employer]. Go to.   Specialty: Professional Counselor Why: Walk in hours are Monday through Friday 9 AM to 1 PM.  You will need to be restablished as a patient. Contact information: [facility] [place] [address] [place] [address] [phone]         [facility] Follow up.   Why: Your ACTT team will resume services following discharge. Contact information: [address] [place] [address] [phone]                 Next level of care provider has access to [facility] Link:no  Safety Planning and Suicide Prevention discussed: No.Contact Attempts: Marcio Hoque, [phone], Brother,  has been identified by the patient as the family member/significant other with whom the patient will be residing, and identified as the person(s) who will aid the patient in the event of a mental health crisis.  With written consent from the patient, two attempts were made to provide suicide prevention education, prior to and/or following the patient's discharge.  We were unsuccessful in providing suicide prevention education.  A suicide education pamphlet was given to the patient to share with family/significant other.   Date and time of first attempt:09/03/23 at 3:40 PM.  Date and time of second attempt:09/05/23 at 3:57 pm     Has patient been referred to the Quitline?: Patient refused referral for  treatment  Patient has been referred for addiction treatment: Yes, the patient will follow up with an outpatient provider for substance use disorder. Psychiatrist/APP: patient to schedule appointment and Therapist: patient to schedule appointment  Lowry Ram, LCSW 09/10/2023, 8:54 AM

## 2023-09-10 NOTE — Group Note (Signed)
 Recreation Therapy Group Note   Group Topic:Self-Esteem  Group Date: 09/10/2023 Start Time: 1000 End Time: 1100 Facilitators: Rosina Lowenstein, LRT, CTRS Location:  Craft Room  Group Description: Positive Affirmation Worksheet. Patients and LRT discussed the importance of self-love/self-esteem and things that cause it to fluctuate, including our mental health. Patients completed a worksheet that helps them identify 24 different strengths and qualities about themselves. Pt encouraged to read aloud at least 3 off their sheet to the group. LRT and pts discussed how this can be applied to daily life post-discharge. After completing worksheet, patients played Positive Affirmation Bingo and won stress balls as prizes.  Goal Area(s) Addressed: Patient will identify positive qualities about themselves. Patient will learn new positive affirmations.  Patient will recite positive qualities and affirmations aloud to the group.  Patient will practice positive self-talk.  Patient will increase communication.   Affect/Mood: Appropriate   Participation Level: Active and Engaged   Participation Quality: Independent   Behavior: Appropriate, Calm, and Cooperative   Speech/Thought Process: Coherent   Insight: Good   Judgement: Good   Modes of Intervention: Education, Exploration, Guided Discussion, Worksheet, and Writing   Patient Response to Interventions:  Attentive, Engaged, and Receptive   Education Outcome:  Acknowledges education   Clinical Observations/Individualized Feedback: Raymond [name] was active in their participation of session activities and group discussion. Pt identified "My favorite place is the beach, I have been told that I have pretty eyes, the color red looks good on me". Pt interacted well with LRT and peers duration of session.    Plan: Continue to engage patient in RT group sessions 2-3x/week.   Rosina Lowenstein, LRT, CTRS 09/10/2023 11:37 AM

## 2023-09-10 NOTE — Progress Notes (Signed)
 Pt calm and pleasant during assessment denying SI/HI/AVH. Pt observed by this Clinical research associate interacting appropriately with staff and peers on the unit. Pt compliant with medication administration per MD orders. Pt given education, support, and encouragement to be active in his treatment plan. Pt being monitored Q 15 minutes for safety per unit protocol, remains safe on the unit

## 2023-09-10 NOTE — [facility] Suicide Risk Assessment (Signed)
 Suicide Risk Assessment  Discharge Assessment    [facility] Discharge Suicide Risk Assessment   Principal Problem: <principal problem not specified> Discharge Diagnoses: Active Problems:   MDD (major depressive disorder), recurrent episode, severe (HCC)   MDD (major depressive disorder)   Substance abuse (HCC)   Total Time spent with patient: 1 hour  Musculoskeletal: Strength & Muscle Tone: within normal limits Gait & Station: normal Patient leans: N/A  Psychiatric Specialty Exam  Presentation  General Appearance:  Appropriate for Environment  Eye Contact: Fleeting  Speech: Clear and Coherent  Speech Volume: Normal  Handedness: Right   Mood and Affect  Mood: Anxious; Depressed  Duration of Depression Symptoms: Greater than two weeks  Affect: Blunt   Thought Process  Thought Processes: Coherent  Descriptions of Associations:Intact  Orientation:Full (Time, Place and Person)  Thought Content:WDL  History of Schizophrenia/Schizoaffective disorder:No  Duration of Psychotic Symptoms:No data recorded Hallucinations:Hallucinations: None Description of Visual Hallucinations: shadows  Ideas of Reference:None  Suicidal Thoughts:Suicidal Thoughts: No  Homicidal Thoughts:Homicidal Thoughts: No   Sensorium  Memory: Immediate Good  Judgment: -- (impulsive)  Insight: Fair   Art therapist  Concentration: Good  Attention Span: Good  Recall: Fair  Fund of Knowledge: Fair  Language: Fair   Psychomotor Activity  Psychomotor Activity: Psychomotor Activity: Normal   Assets  Assets: Communication Skills; Financial Resources/Insurance; Housing; Leisure Time; Social Support; Transportation   Sleep  Sleep: Sleep: Fair   Physical Exam: Physical Exam Vitals and nursing note reviewed.  Constitutional:      Appearance: Normal appearance.  HENT:     Head: Normocephalic and atraumatic.  Eyes:     Extraocular Movements:  Extraocular movements intact.  Pulmonary:     Effort: Pulmonary effort is normal.  Musculoskeletal:     Cervical back: Normal range of motion.  Skin:    General: Skin is dry.  Neurological:     Mental Status: He is alert and oriented to person, place, and time. Mental status is at baseline.  Psychiatric:        Attention and Perception: Attention and perception normal.        Mood and Affect: Mood is anxious and depressed. Affect is blunt.        Speech: Speech normal.        Behavior: Behavior normal. Behavior is cooperative.        Thought Content: Thought content normal.        Cognition and Memory: Cognition and memory normal.        Judgment: Judgment is impulsive.  Review of Systems  Neurological:  Positive for seizures.  Psychiatric/Behavioral:  Positive for depression and substance abuse. The patient is nervous/anxious.   All other systems reviewed and are negative.  Blood pressure 131/87, pulse 78, temperature (!) 97.4 F (36.3 C), resp. rate 20, SpO2 95%. There is no height or weight on file to calculate BMI.  Mental Status Per Nursing Assessment::   On Admission:  Suicidal ideation indicated by patient, Self-harm behaviors  Demographic Factors:  Male, Living alone, and Unemployed  Loss Factors: NA  Historical Factors: Prior suicide attempts, Anniversary of important loss, and Impulsivity  Risk Reduction Factors:   Sense of responsibility to family, Positive social support, and Positive therapeutic relationship  Continued Clinical Symptoms:  Alcohol/Substance Abuse/Dependencies More than one psychiatric diagnosis Previous Psychiatric Diagnoses and Treatments Medical Diagnoses and Treatments/Surgeries  Cognitive Features That Contribute To Risk:  None    Suicide Risk:  Minimal: No identifiable suicidal ideation.  Patients  presenting with no risk factors but with morbid ruminations; may be classified as minimal risk based on the severity of the depressive  symptoms   Follow-up Information     [facility] [employer]. Go to.   Specialty: Professional Counselor Why: Walk in hours are Monday through Friday 9 AM to 1 PM.  You will need to be restablished as a patient. Contact information: [facility] [place] [address] [place] [address] [phone]         [facility] Follow up.   Why: Your ACTT team will resume services following discharge. Contact information: [address] [place] [address] [phone]                 Plan Of Care/Follow-up recommendations:  # It is recommended to the patient to continue psychiatric medications as prescribed, after discharge from the hospital.   # It is recommended to the patient to follow up with your outpatient psychiatric provider and PCP. # It was discussed with the patient, the impact of alcohol, drugs, tobacco have been there overall psychiatric and medical wellbeing, and total abstinence from substance use was recommended. # Prescriptions provided or sent directly to preferred pharmacy at discharge. Patient agreeable to plan. Given the opportunity to ask questions. Appears to feel comfortable with discharge.  # In the event of worsening symptoms, the patient is instructed to call the crisis hotline (988), 911 and or go to the nearest ED for appropriate evaluation and treatment of symptoms. To follow-up with primary care provider for other medical issues, concerns and or health care needs # Patient was discharged home with ACT team with a plan to follow up as noted above.    Camari Quintanilla, PA-C 09/10/2023, 10:13 AM

## 2023-09-10 NOTE — Group Note (Signed)
 Date:  09/10/2023 Time:  10:16 AM  Group Topic/Focus:  Coping With Mental Health Crisis:   The purpose of this group is to help patients identify strategies for coping with mental health crisis.  Group discusses possible causes of crisis and ways to manage them effectively.    Participation Level:  Active  Participation Quality:  Appropriate  Affect:  Appropriate  Cognitive:  Appropriate  Insight: Appropriate  Engagement in Group:  Engaged  Modes of Intervention:  Activity  Additional Comments:    Raymond [name] [name] 09/10/2023, 10:16 AM

## 2023-09-10 NOTE — Discharge Summary (Signed)
 Physician Discharge Summary Note  Patient:  Raymond [name] is an 42 y.o., male MRN:  [id number] DOB:  [birth year] Patient phone:  [phone] (home)  Patient address:   [address] [place] [address],  Total Time spent with patient: 1 hour  Date of Admission:  09/03/2023 Date of Discharge: 09/10/2023  Reason for Admission:  Raymond [name] is a 43 year-old male when presented to the emergency department  yesterday via EMS on 09/02/2023 after he reportedly took 70 pills of all his medication regimen.  He reports that he did that because he was feeling overwhelmed and wanting to kill himself. He also reports that he had been drinking alcohol increasingly to the point of losing awareness. Patient presented to the ED in intoxication state and his alcohol level was 283. His UDS showed cannabinoids in his system. Psych consult was ordered and  inpatient treatment was recommended.  Per chart review, patient was recently  evaluated at [facility] health Emergency Department on 08/30/2023 due to suicidal threats. Upon discharge, patient was recommended to follow up with PCP.  On 08/09/2023, patient was admitted to [facility] under Dr Sherron Flemings for worsening depression, anxiety and panic attacks along with suicidal ideations. He reported that he had overdosed on 90 pills of 500 mg Kepra and Trazodone. His medications were adjusted  and, upon discharge, patient was to follow up at [facility] and appointment was scheduled.  Today patient reports the same symptoms although he reports that he has community services . He reports that Raymond [name] from his ACT team has been coming to see him. Chart review indicates that patient has had multiple visits to the ED  in recent months, and many  resulting into inpatient hospitalizations. Patient reports that he also goes to the [place] for medication management. He reports having alcohol problem and requests treatment.   Principal Problem: <principal problem not  specified> Discharge Diagnoses: Active Problems:   MDD (major depressive disorder), recurrent episode, severe (HCC)   MDD (major depressive disorder)   Substance abuse (HCC)   Past Psychiatric History: Previous Psych Diagnoses: Major depressive disorder, recurrent severe without psychotic features, amphetamine abuse, cocaine abuse, substance-induced mood disorder, with suicide attempt.  Prior inpatient treatment: Admitted to [facility] behavioral health in [place] in 2020 for suicide attempt for 1 month, most recently [facility] 08/2023 Current/prior outpatient treatment: ACT team  Prior rehab hx: Rehab for alcohol x 1 month in 2019 Psychotherapy hx: Yes History of suicide: multiple attempts History of homicide or aggression: Denies Psychiatric medication history: Thorazine, fluoxetine, hydroxyzine, risperidone, and trazodone. Psychiatric medication compliance history: Noncompliance Current Psychiatrist: ACT team  Current therapist: Seeing Raymond [name] at the [facility]  Past Medical History:  Past Medical History:  Diagnosis Date   Alcohol abuse    Anxiety    Hepatitis C    PTSD (post-traumatic stress disorder)     Past Surgical History:  Procedure Laterality Date   FACIAL FRACTURE SURGERY     metal plate under right eye   Family History: History reviewed. No pertinent family history. Family Psychiatric  History: unknown Social History:  Social History   Substance and Sexual Activity  Alcohol Use Yes   Comment: 1 liter bottle of vodka prior to arrival     Social History   Substance and Sexual Activity  Drug Use Yes   Types: Methamphetamines    Social History   Socioeconomic History   Marital status: Single    Spouse name: Not on file   Number of children:  Not on file   Years of education: Not on file   Highest education level: Not on file  Occupational History   Not on file  Tobacco Use   Smoking status: Every Day    Current packs/day: 1.00    Types:  Cigarettes    Passive exposure: Current   Smokeless tobacco: Never  Vaping Use   Vaping status: Never Used  Substance and Sexual Activity   Alcohol use: Yes    Comment: 1 liter bottle of vodka prior to arrival   Drug use: Yes    Types: Methamphetamines   Sexual activity: Not Currently  Other Topics Concern   Not on file  Social History Narrative   Not on file   Social Drivers of Health   Financial Resource Strain: Not on File (08/18/2022)   Received from [employer] [employer]    Financial Resource Strain: 0  Food Insecurity: No Food Insecurity (09/03/2023)   Hunger Vital Sign    Worried About Running Out of Food in the Last Year: Never true    Ran Out of Food in the Last Year: Never true  Transportation Needs: Unmet Transportation Needs (09/03/2023)   PRAPARE - Administrator, Civil Service (Medical): Yes    Lack of Transportation (Non-Medical): Yes  Physical Activity: Not on File (08/18/2022)   Received from [place], [place]   Physical Activity    Physical Activity: 0  Stress: Not on File (08/18/2022)   Received from [facility], [place]   Stress    Stress: 0  Social Connections: Not on File (02/15/2023)   Received from [employer]   Social Connections    Connectedness: 0    Hospital Course:   During the course of hospitalization, pt received daily multiple modalities of treatments consisting of Psychopharmacology, individual, group, psychoeducational, recreational, milieu therapy, including case management to coordinate pts inpatient and outpatient care and in concert with weekly treatment team meetings. Discharge planning was initiated on the day of admission to ensure a safe discharge. The presenting symptoms were closely monitored and medications were started as indicated. There were no complications. The principal reasons for hospitalization consisted of MDD, suicide attempt, alcohol abuse   Medications addressing the principal problem were initiated with  improvement in severity sufficient to discharge to a lower level of care. Patient was started on CIWA protocol and Librium taper which he completed prior to discharge.  Was also restarted on home medications Risperdal 2 mg at bedtime for mood stabilization/visual hallucination, Prozac 60 mg daily for depressive symptoms, continued on trazodone 150 mg at bedtime for insomnia, was up titrated to 200 mg at bedtime.  Patient was also started on melatonin 5 mg at bedtime for sleep.  Pt was seen by neurologist as he complained of unwitnessed seizures.  Neurologist recommendations to taper down Keppra 500 mg twice daily x 5 days, initiate Vimpat 100 mg twice daily.  Recommend patient follow up with outpatient neurologist.  It is intended for the outpatient provider to determine whether to continue these medications, or if these medication needs to be titrated for continued outpatient therapy.  All identified psychiatric, general medical/surgical psychosocial obstacles to discharge were addressed. Patient tolerated these medications with no noted side effects. All these medications were titrated to discharge levels (Please see discharge medications below). Patient showed slow but steady and sustained symptomatic improvement before discharge. The patient denied suicidal, homicidal ideations and hallucinations. Family session held to determine baseline behaviors and for safe discharge plan.  On the day of discharge 09/10/2023, following sustained improvement in the affect of this patient, continued report of depressed and anxious mood related to returning home where he lives alone, though improved from when he was admitted. Pt endorsed repeated denial of suicidal, homicidal and other violent ideations, adequate interaction with peers, active participation in groups while on the unit, and denial of adverse reactions from the medications, the treatment team decided that Raymond [name] was stable for discharge back  home  with scheduled mental health treatment as below. A comprehensive risk assessment was done prior to discharge and shows that patient is at low risk for suicide or violence and will continue to be if patient complies with the treatment recommendations, medications and therapy.  At the time of discharge, patient no longer meeting criteria for IVC, patient is not an imminent danger to self or others. patient agrees to call Crisis Services, 911 and/or return to the ED if safety cannot be maintained outside the hospital setting. Discharge medications reviewed with patient, explanation of indication, risks/benefits and side effects profiles. The patient verbalized understanding and is in agreement with the discharge plan.  Physical Findings: AIMS:  , ,  ,  ,    CIWA:  CIWA-Ar Total: 0 COWS:     Musculoskeletal: Strength & Muscle Tone: within normal limits Gait & Station: normal Patient leans: N/A   Psychiatric Specialty Exam:  Presentation  General Appearance:  Appropriate for Environment  Eye Contact: Fleeting  Speech: Clear and Coherent  Speech Volume: Normal  Handedness: Right   Mood and Affect  Mood: Anxious; Depressed  Affect: Blunt   Thought Process  Thought Processes: Coherent  Descriptions of Associations:Intact  Orientation:Full (Time, Place and Person)  Thought Content:WDL  History of Schizophrenia/Schizoaffective disorder:No  Duration of Psychotic Symptoms:No data recorded Hallucinations:Hallucinations: None Description of Visual Hallucinations: shadows  Ideas of Reference:None  Suicidal Thoughts:Suicidal Thoughts: No  Homicidal Thoughts:Homicidal Thoughts: No   Sensorium  Memory: Immediate Good  Judgment: -- (impulsive)  Insight: Fair   Art therapist  Concentration: Good  Attention Span: Good  Recall: Fair  Fund of Knowledge: Fair  Language: Fair   Psychomotor Activity  Psychomotor Activity: Psychomotor Activity:  Normal   Assets  Assets: Communication Skills; Financial Resources/Insurance; Housing; Leisure Time; Social Support; Transportation   Sleep  Sleep: Sleep: Fair    Physical Exam: Physical Exam Vitals and nursing note reviewed.  Constitutional:      Appearance: Normal appearance.  HENT:     Head: Normocephalic and atraumatic.  Eyes:     Extraocular Movements: Extraocular movements intact.  Pulmonary:     Effort: Pulmonary effort is normal.  Musculoskeletal:     Cervical back: Normal range of motion.  Skin:    General: Skin is dry.  Neurological:     Mental Status: He is alert and oriented to person, place, and time. Mental status is at baseline.  Psychiatric:        Attention and Perception: Attention and perception normal.        Mood and Affect: Mood is anxious and depressed. Affect is blunt.        Speech: Speech normal.        Behavior: Behavior normal. Behavior is cooperative.        Thought Content: Thought content normal.        Cognition and Memory: Cognition and memory normal.        Judgment: Judgment is impulsive.   Review of Systems  Neurological:  Positive  for seizures.  Psychiatric/Behavioral:  Positive for depression and substance abuse. The patient is nervous/anxious.   All other systems reviewed and are negative.  Blood pressure 131/87, pulse 78, temperature (!) 97.4 F (36.3 C), resp. rate 20, SpO2 95%. There is no height or weight on file to calculate BMI.   Social History   Tobacco Use  Smoking Status Every Day   Current packs/day: 1.00   Types: Cigarettes   Passive exposure: Current  Smokeless Tobacco Never   Tobacco Cessation:  A prescription for an FDA-approved tobacco cessation medication provided at discharge   Blood Alcohol level:  Lab Results  Component Value Date   ETH 283 (H) 09/02/2023   ETH 142 (H) 08/30/2023    Metabolic Disorder Labs:  Lab Results  Component Value Date   HGBA1C 5.3 08/11/2023   MPG 105.41  08/11/2023   MPG 108.28 03/28/2023   Lab Results  Component Value Date   PROLACTIN 38.0 (H) 09/06/2023   PROLACTIN 12.0 01/22/2023   Lab Results  Component Value Date   CHOL 172 08/11/2023   TRIG 146 08/11/2023   HDL 53 08/11/2023   CHOLHDL 3.2 08/11/2023   VLDL 29 08/11/2023   LDLCALC 90 08/11/2023   LDLCALC 84 03/28/2023    See Psychiatric Specialty Exam and Suicide Risk Assessment completed by Attending Physician prior to discharge.  Discharge destination:  Home  Is patient on multiple antipsychotic therapies at discharge:  No   Has Patient had three or more failed trials of antipsychotic monotherapy by history:  No  Recommended Plan for Multiple Antipsychotic Therapies: NA   Allergies as of 09/10/2023       Reactions   Peanut Butter Flavor [flavoring Agent]    Other Itching, Rash, Other (See Comments)   Peanuts   Tylenol [acetaminophen] Rash        Medication List     STOP taking these medications    cloNIDine 0.1 MG tablet Commonly known as: CATAPRES   lisinopril 10 MG tablet Commonly known as: ZESTRIL       TAKE these medications      Indication  albuterol 108 (90 Base) MCG/ACT inhaler Commonly known as: VENTOLIN HFA Inhale 2 puffs into the lungs every 4 (four) hours as needed for wheezing or shortness of breath.  Indication: Chronic Obstructive Lung Disease   docusate sodium 100 MG capsule Commonly known as: COLACE Take 1 capsule (100 mg total) by mouth 2 (two) times daily.  Indication: Constipation   FLUoxetine 20 MG capsule Commonly known as: PROZAC Take 3 capsules (60 mg total) by mouth daily.  Indication: Major Depressive Disorder   Lacosamide 100 MG Tabs Take 1 tablet (100 mg total) by mouth 2 (two) times daily.  Indication: Focal Epilepsy   levETIRAcetam 500 MG tablet Commonly known as: KEPPRA Take 1 tablet (500 mg total) by mouth 2 (two) times daily for 5 days. What changed:  medication strength how much to take   Indication: Seizure   melatonin 5 MG Tabs Take 1 tablet (5 mg total) by mouth at bedtime.  Indication: Trouble Sleeping   nicotine polacrilex 2 MG gum Commonly known as: NICORETTE Take 1 each (2 mg total) by mouth as needed for smoking cessation.  Indication: Nicotine Addiction   pantoprazole 20 MG tablet Commonly known as: PROTONIX Take 1 tablet (20 mg total) by mouth daily.  Indication: Gastroesophageal Reflux Disease   polyethylene glycol 17 g packet Commonly known as: MIRALAX / GLYCOLAX Take 17 g by mouth daily.  Indication: Constipation   risperiDONE 2 MG tablet Commonly known as: RISPERDAL Take 1 tablet (2 mg total) by mouth at bedtime. What changed:  how much to take when to take this additional instructions  Indication: Delusions, Major Depressive Disorder   thiamine 100 MG tablet Commonly known as: VITAMIN B1 Take 1 tablet (100 mg total) by mouth daily.  Indication: Deficiency of Vitamin B1   traZODone 100 MG tablet Commonly known as: DESYREL Take 2 tablets (200 mg total) by mouth at bedtime. What changed:  medication strength how much to take  Indication: Trouble Sleeping   vitamin D3 25 MCG tablet Commonly known as: CHOLECALCIFEROL Take 1 tablet (1,000 Units total) by mouth daily.  Indication: Vitamin D Deficiency        Follow-up Information     [facility] [employer]. Go to.   Specialty: Professional Counselor Why: Walk in hours are Monday through Friday 9 AM to 1 PM.  You will need to be restablished as a patient. Contact information: [facility] [place] [address] [place] [address] [phone]         [facility] Follow up.   Why: Your ACTT team will resume services following discharge. Contact information: [address] [place] [address] [phone]                 Follow-up recommendations:   # It is recommended to the patient to continue psychiatric  medications as prescribed, after discharge from the hospital.   # It is recommended to the patient to follow up with your outpatient psychiatric provider and PCP. # It was discussed with the patient, the impact of alcohol, drugs, tobacco have been there overall psychiatric and medical wellbeing, and total abstinence from substance use was recommended. # Prescriptions provided or sent directly to preferred pharmacy at discharge. Patient agreeable to plan. Given the opportunity to ask questions. Appears to feel comfortable with discharge.  # In the event of worsening symptoms, the patient is instructed to call the crisis hotline (988), 911 and or go to the nearest ED for appropriate evaluation and treatment of symptoms. To follow-up with primary care provider for other medical issues, concerns and or health care needs # Patient was discharged home with ACT team with a plan to follow up as noted above.    SignedPaulene Floor, PA-C 09/10/2023, 10:16 AM

## 2023-09-10 NOTE — Plan of Care (Signed)

## 2023-09-10 NOTE — Progress Notes (Signed)
   09/10/23 0900  Psych Admission Type (Psych Patients Only)  Admission Status Voluntary  Psychosocial Assessment  Patient Complaints Anxiety  Eye Contact Fair  Facial Expression Animated;Anxious  Affect Anxious;Appropriate to circumstance  Speech Logical/coherent  Interaction Assertive  Motor Activity Slow  Appearance/Hygiene Unremarkable  Behavior Characteristics Cooperative;Appropriate to situation  Mood Anxious;Pleasant  Aggressive Behavior  Effect No apparent injury  Thought Process  Coherency WDL  Content WDL  Delusions None reported or observed  Perception WDL  Hallucination None reported or observed  Judgment WDL  Confusion None  Danger to Self  Current suicidal ideation? Denies  Agreement Not to Harm Self Yes  Description of Agreement Verbal  Danger to Others  Danger to Others None reported or observed

## 2023-09-10 NOTE — Progress Notes (Signed)
 Patient ID: Raymond [name], male   DOB: 10/30/1980, 43 y.o.   MRN: [id number]  Discharge Note:  Patient denies SI/HI/AVH at this time. Discharge instructions, AVS, medication supply, prescriptions, and transition record gone over with patient. Patient declined to fill out his Suicide Safety Plan. Patient agrees to comply with medication management, follow-up visit, and outpatient therapy. Patient belongings returned to patient. Patient questions and concerns addressed and answered. Patient ambulatory off unit. Patient discharged to home with ACTT services.

## 2023-09-10 NOTE — Plan of Care (Signed)
   Problem: Education: Goal: Emotional status will improve Outcome: Progressing Goal: Mental status will improve Outcome: Progressing

## 2023-09-10 NOTE — BHH Counselor (Signed)
 CSW touched base with Charlott Rakes, substance use counselor and assistant team lead of patient's [facility] ACTT team at [phone]. He has confirmed that he will come to pick the patient up at discharge.   This has been communicated to team and patient. Patient agrees.    Raymond [name], MSW, LCSWA 09/10/2023 8:54 AM

## 2023-09-25 ENCOUNTER — Encounter (HOSPITAL_COMMUNITY): Payer: Self-pay

## 2023-09-25 ENCOUNTER — Other Ambulatory Visit: Payer: Self-pay

## 2023-09-25 ENCOUNTER — Emergency Department (HOSPITAL_COMMUNITY)
Admission: EM | Admit: 2023-09-25 | Discharge: 2023-09-26 | Disposition: A | Discharge status: Home / Self Care | Attending: Emergency Medicine | Admitting: Emergency Medicine

## 2023-09-25 DIAGNOSIS — T50912A Poisoning by multiple unspecified drugs, medicaments and biological substances, intentional self-harm, initial encounter: Secondary | ICD-10-CM | POA: Diagnosis present

## 2023-09-25 DIAGNOSIS — R45851 Suicidal ideations: Secondary | ICD-10-CM | POA: Diagnosis not present

## 2023-09-25 DIAGNOSIS — Z91010 Allergy to peanuts: Secondary | ICD-10-CM | POA: Diagnosis not present

## 2023-09-25 DIAGNOSIS — F1910 Other psychoactive substance abuse, uncomplicated: Secondary | ICD-10-CM | POA: Diagnosis not present

## 2023-09-25 DIAGNOSIS — F332 Major depressive disorder, recurrent severe without psychotic features: Secondary | ICD-10-CM | POA: Diagnosis not present

## 2023-09-25 DIAGNOSIS — F329 Major depressive disorder, single episode, unspecified: Secondary | ICD-10-CM | POA: Diagnosis not present

## 2023-09-25 DIAGNOSIS — T1491XA Suicide attempt, initial encounter: Secondary | ICD-10-CM | POA: Diagnosis present

## 2023-09-25 DIAGNOSIS — R4689 Other symptoms and signs involving appearance and behavior: Secondary | ICD-10-CM | POA: Diagnosis present

## 2023-09-25 DIAGNOSIS — T50902A Poisoning by unspecified drugs, medicaments and biological substances, intentional self-harm, initial encounter: Secondary | ICD-10-CM

## 2023-09-25 LAB — COMPREHENSIVE METABOLIC PANEL WITH GFR
ALT: 26 U/L (ref 0–44)
AST: 26 U/L (ref 15–41)
Albumin: 3.9 g/dL (ref 3.5–5.0)
Alkaline Phosphatase: 71 U/L (ref 38–126)
Anion gap: 12 (ref 5–15)
BUN: 10 mg/dL (ref 6–20)
CO2: 20 mmol/L — ABNORMAL LOW (ref 22–32)
Calcium: 8.9 mg/dL (ref 8.9–10.3)
Chloride: 104 mmol/L (ref 98–111)
Creatinine, Ser: 0.76 mg/dL (ref 0.61–1.24)
GFR, Estimated: >60 mL/min (ref >60)
Glucose, Bld: 113 mg/dL — ABNORMAL HIGH (ref 70–99)
Potassium: 3.7 mmol/L (ref 3.5–5.1)
Sodium: 136 mmol/L (ref 135–145)
Total Bilirubin: 0.7 mg/dL (ref 0.0–1.2)
Total Protein: 7.3 g/dL (ref 6.5–8.1)

## 2023-09-25 LAB — CBC WITH DIFFERENTIAL/PLATELET
Abs Immature Granulocytes: 0.02 10*3/uL (ref 0.00–0.07)
Basophils Absolute: 0 10*3/uL (ref 0.0–0.1)
Basophils Relative: 1 %
Eosinophils Absolute: 0.1 10*3/uL (ref 0.0–0.5)
Eosinophils Relative: 1 %
HCT: 49.3 % (ref 39.0–52.0)
Hemoglobin: 16.6 g/dL (ref 13.0–17.0)
Immature Granulocytes: 0 %
Lymphocytes Relative: 20 %
Lymphs Abs: 1.3 10*3/uL (ref 0.7–4.0)
MCH: 31.4 pg (ref 26.0–34.0)
MCHC: 33.7 g/dL (ref 30.0–36.0)
MCV: 93.2 fL (ref 80.0–100.0)
Monocytes Absolute: 0.6 10*3/uL (ref 0.1–1.0)
Monocytes Relative: 9 %
Neutro Abs: 4.6 10*3/uL (ref 1.7–7.7)
Neutrophils Relative %: 69 %
Platelets: 283 10*3/uL (ref 150–400)
RBC: 5.29 MIL/uL (ref 4.22–5.81)
RDW: 12.8 % (ref 11.5–15.5)
WBC: 6.6 10*3/uL (ref 4.0–10.5)
nRBC: 0 % (ref 0.0–0.2)

## 2023-09-25 LAB — RESP PANEL BY RT-PCR (RSV, FLU A&B, COVID)  RVPGX2
Influenza A by PCR: NEGATIVE
Influenza B by PCR: NEGATIVE
Resp Syncytial Virus by PCR: NEGATIVE
SARS Coronavirus 2 by RT PCR: NEGATIVE

## 2023-09-25 LAB — ACETAMINOPHEN LEVEL: Acetaminophen (Tylenol), Serum: <10 ug/mL — ABNORMAL LOW (ref 10–30)

## 2023-09-25 LAB — ETHANOL: Alcohol, Ethyl (B): 28 mg/dL — ABNORMAL HIGH (ref <15)

## 2023-09-25 LAB — SALICYLATE LEVEL: Salicylate Lvl: <7 mg/dL — ABNORMAL LOW (ref 7.0–30.0)

## 2023-09-25 MED ORDER — NICOTINE POLACRILEX 2 MG MT GUM
2.0000 mg | CHEWING_GUM | OROMUCOSAL | Status: DC
Start: 2023-09-25 — End: 2023-09-26
  Administered 2023-09-25 – 2023-09-26 (×2): 2 mg via ORAL
  Filled 2023-09-25 (×2): qty 1

## 2023-09-25 MED ORDER — LORAZEPAM 1 MG PO TABS
0.0000 mg | ORAL_TABLET | Freq: Two times a day (BID) | ORAL | Status: DC
Start: 2023-09-27 — End: 2023-09-29

## 2023-09-25 MED ORDER — NICOTINE 21 MG/24HR TD PT24
21.0000 mg | MEDICATED_PATCH | Freq: Every day | TRANSDERMAL | Status: DC
  Filled 2023-09-25: qty 1

## 2023-09-25 MED ORDER — LORAZEPAM 2 MG/ML IJ SOLN: 0.0000 mg | Freq: Two times a day (BID) | INTRAMUSCULAR | Status: DC

## 2023-09-25 MED ORDER — ONDANSETRON HCL 4 MG PO TABS
4.0000 mg | ORAL_TABLET | Freq: Three times a day (TID) | ORAL | Status: DC | PRN
  Administered 2023-09-25: 4 mg via ORAL
  Filled 2023-09-25: qty 1

## 2023-09-25 MED ORDER — LORAZEPAM 2 MG/ML IJ SOLN: 0.0000 mg | Freq: Four times a day (QID) | INTRAMUSCULAR | Status: DC

## 2023-09-25 MED ORDER — THIAMINE HCL 100 MG/ML IJ SOLN
100.0000 mg | Freq: Every day | INTRAMUSCULAR | Status: DC
Start: 2023-09-25 — End: 2023-09-26

## 2023-09-25 MED ORDER — THIAMINE MONONITRATE 100 MG PO TABS
100.0000 mg | ORAL_TABLET | Freq: Every day | ORAL | Status: DC
  Administered 2023-09-25: 100 mg via ORAL
  Filled 2023-09-25: qty 1

## 2023-09-25 MED ORDER — LORAZEPAM 1 MG PO TABS
0.0000 mg | ORAL_TABLET | Freq: Four times a day (QID) | ORAL | Status: DC
Start: 2023-09-25 — End: 2023-09-27
  Administered 2023-09-25: 1 mg via ORAL
  Administered 2023-09-26: 2 mg via ORAL
  Filled 2023-09-25: qty 1
  Filled 2023-09-25: qty 2

## 2023-09-25 NOTE — ED Triage Notes (Signed)
 0100 pt states he took around 70 pills that caused him to have a seizure unwitnessed. Pt reports feeling suicidal last night and today. Pt picked up from [facility]. He called 911. Hx high BP

## 2023-09-25 NOTE — ED Notes (Signed)
 This Nurse received a call from [employer], Samuel Crock stated pt will need a COVID test results and all documents faxed over for placement in facility as of 04/26. Fax number [phone]

## 2023-09-25 NOTE — ED Notes (Signed)
 Belongings are in locker 27

## 2023-09-25 NOTE — ED Provider Notes (Signed)
 [place] EMERGENCY DEPARTMENT AT [facility] Provider Note   CSN: [id number] Arrival date & time: 09/25/23  1411     History  Chief Complaint  Patient presents with   Suicidal    Raymond [name] is a 43 y.o. male.  HPI 43 year old male presents with an intentional overdose.  He states last night around 11 PM he took about 70 pills which were a mixture of all of his medications over the course of a few hours.  Over the past few hours he has been feeling dizzy with a little headache.  He denies any current chest pain or shortness of breath.  He does not feel as suicidal as he did last night but still feels a sensation to harm himself.  This is a recurring problem for him.  He also abuses alcohol.  Home Medications Prior to Admission medications   Medication Sig Start Date End Date Taking? Authorizing Provider  albuterol  (VENTOLIN  HFA) 108 (90 Base) MCG/ACT inhaler Inhale 2 puffs into the lungs every 4 (four) hours as needed for wheezing or shortness of breath. 10/16/22   Clapacs, Elida Grounds, MD  docusate sodium  (COLACE) 100 MG capsule Take 1 capsule (100 mg total) by mouth 2 (two) times daily. 08/18/23   Ntuen, Dorinda Garland, FNP  FLUoxetine  (PROZAC ) 20 MG capsule Take 3 capsules (60 mg total) by mouth daily. 09/09/23   Tingling, Trevor Fudge, PA-C  lacosamide  100 MG TABS Take 1 tablet (100 mg total) by mouth 2 (two) times daily. 09/10/23   Tingling, Trevor Fudge, PA-C  levETIRAcetam  (KEPPRA ) 500 MG tablet Take 1 tablet (500 mg total) by mouth 2 (two) times daily for 5 days. 09/10/23 09/15/23  Tingling, Stephanie, PA-C  melatonin 5 MG TABS Take 1 tablet (5 mg total) by mouth at bedtime. 09/09/23   Tingling, Trevor Fudge, PA-C  nicotine  polacrilex (NICORETTE ) 2 MG gum Take 1 each (2 mg total) by mouth as needed for smoking cessation. 09/09/23   Tingling, Trevor Fudge, PA-C  pantoprazole  (PROTONIX ) 20 MG tablet Take 1 tablet (20 mg total) by mouth daily. 08/19/23   Ntuen, Tina C, FNP  polyethylene glycol (MIRALAX  /  GLYCOLAX ) 17 g packet Take 17 g by mouth daily. 08/19/23   Ntuen, Tina C, FNP  risperiDONE  (RISPERDAL ) 2 MG tablet Take 1 tablet (2 mg total) by mouth at bedtime. 09/09/23   Tingling, Trevor Fudge, PA-C  thiamine  (VITAMIN B-1) 100 MG tablet Take 1 tablet (100 mg total) by mouth daily. 09/10/23   Tingling, Trevor Fudge, PA-C  traZODone  (DESYREL ) 100 MG tablet Take 2 tablets (200 mg total) by mouth at bedtime. 09/09/23   Tingling, Trevor Fudge, PA-C  vitamin D3 (CHOLECALCIFEROL ) 25 MCG tablet Take 1 tablet (1,000 Units total) by mouth daily. 09/09/23   Tingling, Trevor Fudge, PA-C      Allergies    Peanut butter flavor [flavoring agent], Other, and Tylenol  [acetaminophen ]    Review of Systems   Review of Systems  Respiratory:  Negative for shortness of breath.   Cardiovascular:  Negative for chest pain.  Gastrointestinal:  Negative for vomiting.  Neurological:  Positive for dizziness and headaches.  Psychiatric/Behavioral:  Positive for suicidal ideas.     Physical Exam Updated Vital Signs BP (!) 131/91   Pulse 98   Temp 98.2 F (36.8 C) (Oral)   Resp 20   Ht 6\' 1"  (1.854 m)   Wt 77.2 kg   SpO2 99%   BMI 22.45 kg/m  Physical Exam Vitals and nursing note reviewed.  Constitutional:  Appearance: He is well-developed.  HENT:     Head: Normocephalic and atraumatic.  Cardiovascular:     Rate and Rhythm: Normal rate and regular rhythm.     Heart sounds: Normal heart sounds.  Pulmonary:     Effort: Pulmonary effort is normal.     Breath sounds: Normal breath sounds.  Abdominal:     Palpations: Abdomen is soft.     Tenderness: There is no abdominal tenderness.  Skin:    General: Skin is warm and dry.  Neurological:     Mental Status: He is alert.     Comments: Awake, alert, not intoxicated. Equal strength but poor effort in all 4 extremities.  Psychiatric:        Thought Content: Thought content includes suicidal ideation.     ED Results / Procedures / Treatments   Labs (all labs  ordered are listed, but only abnormal results are displayed) Labs Reviewed  ACETAMINOPHEN  LEVEL - Abnormal; Notable for the following components:      Result Value   Acetaminophen  (Tylenol ), Serum <10 (*)    All other components within normal limits  COMPREHENSIVE METABOLIC PANEL WITH GFR - Abnormal; Notable for the following components:   CO2 20 (*)    Glucose, Bld 113 (*)    All other components within normal limits  ETHANOL - Abnormal; Notable for the following components:   Alcohol, Ethyl (B) 28 (*)    All other components within normal limits  SALICYLATE LEVEL - Abnormal; Notable for the following components:   Salicylate Lvl <7.0 (*)    All other components within normal limits  CBC WITH DIFFERENTIAL/PLATELET  RAPID URINE DRUG SCREEN, HOSP PERFORMED    EKG EKG Interpretation Date/Time:  Friday September 25 2023 16:15:29 EDT Ventricular Rate:  72 PR Interval:  160 QRS Duration:  102 QT Interval:  370 QTC Calculation: 405 R Axis:   0  Text Interpretation: Normal sinus rhythm Incomplete right bundle branch block  no significant change since September 02 2023 Confirmed by Jerilynn Montenegro [phone]) on 09/25/2023 4:21:02 PM  Radiology No results found.  Procedures Procedures    Medications Ordered in ED Medications  LORazepam  (ATIVAN ) injection 0-4 mg (has no administration in time range)    Or  LORazepam  (ATIVAN ) tablet 0-4 mg (has no administration in time range)  LORazepam  (ATIVAN ) injection 0-4 mg (has no administration in time range)    Or  LORazepam  (ATIVAN ) tablet 0-4 mg (has no administration in time range)  thiamine  (VITAMIN B1) tablet 100 mg (has no administration in time range)    Or  thiamine  (VITAMIN B1) injection 100 mg (has no administration in time range)  nicotine  (NICODERM CQ  - dosed in mg/24 hours) patch 21 mg (has no administration in time range)  ondansetron  (ZOFRAN ) tablet 4 mg (has no administration in time range)    ED Course/ Medical Decision Making/  A&P                                 Medical Decision Making Amount and/or Complexity of Data Reviewed External Data Reviewed: notes. Labs: ordered.    Details: Normal Tylenol  level ECG/medicine tests: ordered and independent interpretation performed.    Details: No ischemia or arrhythmia.  Normal QTc.  Risk OTC drugs. Prescription drug management.   Patient reports an overdose over 12 hours ago.  Had a similar presentation earlier in the month when he also reportedly took 16  pills.  Unclear whether he really took these last night or not but either way it has been over 12 hours and there is no indication of a significant overdose or need for further monitoring.  Poison control was notified.  ECG is unremarkable.  Labs are unremarkable.  I think at this point that he is medically stable for psychiatric admission.  Will consult TTS.        Final Clinical Impression(s) / ED Diagnoses Final diagnoses:  None    Rx / DC Orders ED Discharge Orders     None         Jerilynn Montenegro, MD 09/25/23 [phone]

## 2023-09-25 NOTE — ED Notes (Signed)
 Spoke with Tyra Galley at [employer] who recommends EKG and observe for 6 hours.

## 2023-09-25 NOTE — TOC Progression Note (Signed)
 Transition of Care [facility]) - Progression Note    Patient Details  Name: Raymond [name] MRN: [id number] Date of Birth: 1980/12/07  [facility]) CM/SW Contact  [name], LCSWA Phone Number: 09/25/2023, 10:22 PM  Clinical Narrative:     Pt was accepted to: [facility]-pending negative covid test Accepting MD-Saadia Gillermo Lack, MD [facility]  Report# [id number] Transportation can be set up after 9:30 am  RN and MD made aware.         Expected Discharge Plan and Services                                               Social Determinants of Health (SDOH) Interventions SDOH Screenings   Food Insecurity: No Food Insecurity (09/03/2023)  Housing: Low Risk  (09/03/2023)  Transportation Needs: Unmet Transportation Needs (09/03/2023)  Utilities: Not At Risk (09/03/2023)  Alcohol Screen: Low Risk  (09/03/2023)  Financial Resource Strain: Not on File (08/18/2022)   Received from [place], [place]  Physical Activity: Not on File (08/18/2022)   Received from [place], [place]  Social Connections: Not on File (02/15/2023)   [facility]  Stress: Not on File (08/18/2022)   [facility]  Tobacco Use: High Risk (09/25/2023)    Readmission Risk Interventions     No data to display

## 2023-09-25 NOTE — TOC Initial Note (Signed)
 Transition of Care [facility]) - Initial/Assessment Note    Patient Details  Name: Raymond [name] MRN: [id number] Date of Birth: 10-09-1980  [facility]) CM/SW Contact:    Raymond [name], LCSWA Phone Number: 09/25/2023, 9:35 PM  Clinical Narrative:                  Pt meets criteria for inpatient hospitalization per [facility], PMHNP, no available beds at [facility] per [facility] Deno Flair, pt faxed out to the following facilities:  [facility] Pending - Request [name] [address] [phone] --  [facility] Pending - Request Sent -- [address] [name] [address] [phone] --  [facility] Pending - Request Sent -- [address] [place], [name] [address] [phone] --  [facility] Pending - Request Sent -- [address] [name] [address] [phone] --  [facility] Pending - Request Sent -- [address] [name] [address] [phone] --  [facility] Pending - Request Sent -- [address] [name] [address] [phone] --  [facility] Pending - Request Sent -- [address], [name] [address] [phone] --  [facility] Pending - Request Sent -- [address] [name] [address] [phone] --  [facility] Pending - Request Sent -- [address] [name] [address] [phone] --  [facility] Pending - Request Sent -- [address] [name] [address] [phone] --  [facility] Pending - Request Sent -- [address] [name] [address] [name] [address] [phone] --  [facility] Pending - Request Sent -- [address] [name] [address] [phone] --  [name] Pending - Request Sent -- [address] [name] [address] [phone] --  [name] Pending - Request Sent -- [address] [name] [address] [phone] --  [facility] Pending - Request Sent -- [address], [name] [address] [phone] --  [facility] Pending - Request Sent -- [address], [name] [address] [phone] --  [facility] Pending - Request Sent -- [address] [name] [address] [phone] --  [facility] Pending - Request Sent -- [address] [place], [name] [address] [phone] --  [facility] Pending - Request Sent -- [address] [name] [address] [phone] --           Patient Goals and CMS Choice            Expected Discharge Plan and Services                                              Prior Living Arrangements/Services                       Activities of Daily Living      Permission Sought/Granted                  Emotional Assessment  Admission diagnosis:  SI/Attempt Patient Active Problem List   Diagnosis Date Noted   Substance abuse (HCC) 09/04/2023   MDD (major depressive disorder) 09/03/2023   Major depressive disorder, recurrent severe without psychotic features (HCC) 08/09/2023   History of posttraumatic stress disorder (PTSD) 08/08/2023   Ingestion of substance 08/08/2023   Methamphetamine abuse (HCC) 03/28/2023   MDD (major depressive disorder), recurrent episode, severe (HCC) 03/27/2023   Suicidal ideation 02/04/2023   Major depressive disorder 01/23/2023   Major depressive disorder, recurrent, severe  without psychotic behavior (HCC) 10/27/2022   Suicide attempt (HCC) 10/27/2022   Traumatic brain injury (HCC) 09/22/2022   Seizure disorder (HCC) 09/22/2022   Cocaine abuse (HCC) 09/22/2022   Alcohol abuse 09/22/2022   PCP:  Darol Elizabeth, NP Pharmacy:   [facility] [address] [name] [address] Phone: [phone] Fax: [phone]  [facility] - [place], [name] - [address] [facility] [facility] [address] [name] [address] Phone: [phone] Fax: [phone]     Social Drivers of Health (SDOH) Social History: SDOH Screenings   Food Insecurity: No Food Insecurity (09/03/2023)  Housing: Low Risk  (09/03/2023)  Transportation Needs: Unmet Transportation Needs (09/03/2023)  Utilities: Not At Risk (09/03/2023)  Alcohol Screen: Low Risk  (09/03/2023)  Financial Resource Strain: Not on File (08/18/2022)   Received from [name], [place]  Physical Activity: Not on File (08/18/2022)   Received from [name], [place]  Social Connections: Not on File (02/15/2023)   Received from [name]  Stress: Not on File (08/18/2022)   Received from [name], [place]  Tobacco Use: High Risk (09/25/2023)   SDOH Interventions:     Readmission Risk Interventions     No data to display

## 2023-09-25 NOTE — ED Notes (Signed)
 Pt dressed out and security came and wanded him.

## 2023-09-25 NOTE — Consult Note (Signed)
 [facility] Psychiatric Consult Initial  Patient Name: .Raymond [name]  MRN: [id number]  DOB: May 01, 1981  Consult Order details:  Orders (From admission, onward)     Start     Ordered   09/25/23 1623  CONSULT TO CALL ACT TEAM       Ordering Provider: Jerilynn Montenegro, MD  Provider:  (Not yet assigned)  Question:  Reason for Consult?  Answer:  Psych consult   09/25/23 1623             Mode of Visit: In person    Psychiatry Consult Evaluation  Service Date: September 25, 2023 LOS:  LOS: 0 days  Chief Complaint "intentional overdose"  Primary Psychiatric Diagnoses  MDD 2.   Polysubstance abuse  Assessment  Raymond [name] is a 43 y.o. male admitted: Presented to the ED on 09/25/2023  2:13 PM for intentional overdose. He carries the psychiatric diagnoses of depression, PTSD and anxiety and has a past medical history of none.   His current presentation of anhedonia, social isolation, and existential despair is most consistent with Major Depressive Episode . He meets criteria for inpatient admission.  Current outpatient psychotropic medications include Prozac , Risperdal , Trazodone  and historically he has had a positive response to these medications. He was non compliant with medications prior to admission as evidenced by patient report. On initial examination, patient appears, sad and hopeless. Please see plan below for detailed recommendations.   Diagnoses:  Active Hospital problems: Principal Problem:   MDD (major depressive disorder), recurrent episode, severe (HCC) Active Problems:   Suicide attempt [facility])    Plan   ## Psychiatric Medication Recommendations:  Continue patient home medications      ## Medical Decision Making Capacity: Not specifically addressed in this encounter     ## Disposition:-- We recommend inpatient psychiatric hospitalization when medically cleared. Patient is under voluntary admission status at this time; please IVC if attempts to leave hospital.   ##  Behavioral / Environmental: - No specific recommendations at this time.                 ## Safety and Observation Level:  - Based on my clinical evaluation, I estimate the patient to be at low risk of self harm in the current setting. - At this time, we recommend  routine. This decision is based on my review of the chart including patient's history and current presentation, interview of the patient, mental status examination, and consideration of suicide risk including evaluating suicidal ideation, plan, intent, suicidal or self-harm behaviors, risk factors, and protective factors. This judgment is based on our ability to directly address suicide risk, implement suicide prevention strategies, and develop a safety plan while the patient is in the clinical setting. Please contact our team if there is a concern that risk level has changed.   CSSR Risk Category:C-SSRS RISK CATEGORY: High Risk   Suicide Risk Assessment: Patient has following modifiable risk factors for suicide: untreated depression, which we are addressing by inpatient admission. Patient has following non-modifiable or demographic risk factors for suicide: male gender Patient has the following protective factors against suicide: Supportive friends   Thank you for this consult request. Recommendations have been communicated to the primary team.  We will recommend inpatient at this time.   Chandra Come, PMHNP       History of Present Illness  Relevant Aspects of Hospital ED Course:  Admitted on 09/25/2023 for intentional overdose.   Patient Report:  Raymond [name], 43 y.o., male patient seen  face to face by this provider, consulted with Dr. Deborah Falling; and chart reviewed on 09/25/23.  On evaluation Raymond [name] reports that he was having suicidal thoughts and intentionally took about 70 pills which were a mixture of all of his medications over the course of a few hours.  Patient says he is still feeling suicidal. Patient states he  had a seizure after in his apartment, that was witnessed by a neighbor. Patient states he has a history of seizures.  Patient is unable to contract for safety, states he is scared that he will one day take his own life.  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    He states he's been treated for seizures, anxiety, depression, PTSD, EtOH abuse, hepatitis C. Currently prescribed: fluoxetine , trazodone , and risperidone . Reports stressor are his mom died 2 years ago, hx of losing 2 children from miscarriage; he is unable to relate any current stressor. Patient states " I should be happy because I just got approved for my disability, and I want to eventually get a 2 bedroom house." Patient appears to be future oriented at times, but continues to endorse SI.  Patient endorses using THC, and drinking beer daily, patient UDS positive for THC, and BAL is 28.    Client is currently reporting symptoms of depression with anergia, anhedonia, amotivation, no anxiety, frequent worry, feeling restlessness. There is no evidence of psychosis or delusional thinking.  Client denied past episodes of hypomania, hyperactivity, erratic/excessive spending, involvement in dangerous activities, self-inflated ego, grandiosity, or promiscuity.     Psych ROS:  Depression: yes Anxiety:  yes Mania (lifetime and current): no Psychosis: (lifetime and current): no  Collateral information:  Contacted None  Review of Systems  Psychiatric/Behavioral:  Positive for depression and suicidal ideas.      Psychiatric and Social History  Psychiatric History:  Information collected from Patient   Prev Dx/Sx: Depression Current Psych Provider: Unknown- [facility] see above Previous Med Trials: unknown Therapy: unknown   Prior Psych Hospitalization: Yes  Prior Self Harm: Yes Prior  Violence: unknown   Family Psych History: Yes Family Hx suicide: Denies   Social History:  Educational Hx: graduated high school Occupational Hx: Technical brewer Hx: Denies Living Situation: lives alone in apartment Spiritual Hx: unknown Access to weapons/lethal means: no    Substance History Alcohol: yes  Type of alcohol beer Last Drink today Number of drinks per day -social History of alcohol withdrawal seizures Yes History of DT's Denies Tobacco: Yes Illicit drugs: Yes Prescription drug abuse: Denies Rehab hx: unknown  Exam Findings  Physical Exam:  Vital Signs:  Temp:  [98.2 F (36.8 C)] 98.2 F (36.8 C) (04/25 1431) Pulse Rate:  [98] 98 (04/25 1431) Resp:  [20] 20 (04/25 1431) BP: (131)/(91) 131/91 (04/25 1431) SpO2:  [99 %] 99 % (04/25 1431) Weight:  [77.2 kg] 77.2 kg (04/25 1424) Blood pressure (!) 131/91, pulse 98, temperature 98.2 F (36.8 C), temperature source Oral, resp. rate 20, height 6\' 1"  (1.854 m), weight 77.2 kg, SpO2 99%. Body mass index is 22.45 kg/m.  Physical Exam Vitals and nursing note reviewed. Exam conducted with a chaperone present.  Neurological:     Mental Status: He is alert.  Psychiatric:        Attention and Perception: Attention normal.  Mood and Affect: Mood is depressed. Affect is flat.        Speech: Speech normal.        Behavior: Behavior is cooperative.        Thought Content: Thought content includes suicidal ideation. Thought content includes suicidal plan.        Cognition and Memory: Memory normal.        Judgment: Judgment is impulsive.     Mental Status Exam: General Appearance: Disheveled  Orientation:  Full (Time, Place, and Person)  Memory:  Immediate;   Fair Remote;   Fair  Concentration:  Concentration: Fair and Attention Span: Fair  Recall:  Fair  Attention  Fair  Eye Contact:  Fair  Speech:  Clear and Coherent  Language:  Fair  Volume:  Normal  Mood: sad  Affect:  Appropriate and Depressed   Thought Process:  Coherent  Thought Content:  WDL  Suicidal Thoughts:  Yes.  without intent/plan  Homicidal Thoughts:  No  Judgement:  Fair  Insight:  Fair  Psychomotor Activity:  Normal  Akathisia:  NA  Fund of Knowledge:  Fair      Assets:  Manufacturing systems engineer Desire for Improvement Housing Resilience  Cognition:  WNL  ADL's:  Impaired  AIMS (if indicated):        Other History   These have been pulled in through the EMR, reviewed, and updated if appropriate.  Family History:  The patient's family history is not on file.  Medical History: Past Medical History:  Diagnosis Date   Alcohol abuse    Anxiety    Hepatitis C    PTSD (post-traumatic stress disorder)     Surgical History: Past Surgical History:  Procedure Laterality Date   FACIAL FRACTURE SURGERY     metal plate under right eye     Medications:   Current Facility-Administered Medications:    LORazepam  (ATIVAN ) injection 0-4 mg, 0-4 mg, Intravenous, Q6H **OR** LORazepam  (ATIVAN ) tablet 0-4 mg, 0-4 mg, Oral, Q6H, Jerilynn Montenegro, MD   [START ON 09/27/2023] LORazepam  (ATIVAN ) injection 0-4 mg, 0-4 mg, Intravenous, Q12H **OR** [START ON 09/27/2023] LORazepam  (ATIVAN ) tablet 0-4 mg, 0-4 mg, Oral, Q12H, Goldston, Scott, MD   nicotine  (NICODERM CQ  - dosed in mg/24 hours) patch 21 mg, 21 mg, Transdermal, Daily, Goldston, Scott, MD   nicotine  polacrilex (NICORETTE ) gum 2 mg, 2 mg, Oral, Q4H while awake, Motley-Mangrum, Taqwa Deem A, PMHNP   ondansetron  (ZOFRAN ) tablet 4 mg, 4 mg, Oral, Q8H PRN, Jerilynn Montenegro, MD   thiamine  (VITAMIN B1) tablet 100 mg, 100 mg, Oral, Daily **OR** thiamine  (VITAMIN B1) injection 100 mg, 100 mg, Intravenous, Daily, Jerilynn Montenegro, MD  Current Outpatient Medications:    albuterol  (VENTOLIN  HFA) 108 (90 Base) MCG/ACT inhaler, Inhale 2 puffs into the lungs every 4 (four) hours as needed for wheezing or shortness of breath., Disp: 6.7 g, Rfl: 0   docusate sodium  (COLACE) 100 MG capsule,  Take 1 capsule (100 mg total) by mouth 2 (two) times daily., Disp: 10 capsule, Rfl: 0   FLUoxetine  (PROZAC ) 20 MG capsule, Take 3 capsules (60 mg total) by mouth daily., Disp: 90 capsule, Rfl: 0   lacosamide  100 MG TABS, Take 1 tablet (100 mg total) by mouth 2 (two) times daily., Disp: 60 tablet, Rfl: 0   levETIRAcetam  (KEPPRA ) 500 MG tablet, Take 1 tablet (500 mg total) by mouth 2 (two) times daily for 5 days., Disp: 9 tablet, Rfl: 0   melatonin 5 MG TABS, Take 1 tablet (5  mg total) by mouth at bedtime., Disp: 30 tablet, Rfl: 0   nicotine  polacrilex (NICORETTE ) 2 MG gum, Take 1 each (2 mg total) by mouth as needed for smoking cessation., Disp: 100 tablet, Rfl: 0   pantoprazole  (PROTONIX ) 20 MG tablet, Take 1 tablet (20 mg total) by mouth daily., Disp: 30 tablet, Rfl: 0   polyethylene glycol (MIRALAX  / GLYCOLAX ) 17 g packet, Take 17 g by mouth daily., Disp: 30 packet, Rfl: 0   risperiDONE  (RISPERDAL ) 2 MG tablet, Take 1 tablet (2 mg total) by mouth at bedtime., Disp: 30 tablet, Rfl: 0   thiamine  (VITAMIN B-1) 100 MG tablet, Take 1 tablet (100 mg total) by mouth daily., Disp: 30 tablet, Rfl: 0   traZODone  (DESYREL ) 100 MG tablet, Take 2 tablets (200 mg total) by mouth at bedtime., Disp: 60 tablet, Rfl: 0   vitamin D3 (CHOLECALCIFEROL ) 25 MCG tablet, Take 1 tablet (1,000 Units total) by mouth daily., Disp: 30 tablet, Rfl: 0  Allergies: Allergies  Allergen Reactions   Peanut Butter Flavor [Flavoring Agent]    Other Itching, Rash and Other (See Comments)    Peanuts   Tylenol  [Acetaminophen ] Rash    Ayslin Kundert MOTLEY-MANGRUM, PMHNP

## 2023-09-25 NOTE — ED Notes (Signed)
 Per Child psychotherapist; "Pt was accepted to [facility] pending negative covid test, he can go tomorrow, transportation can be set up after 9:30 am. "

## 2023-09-25 NOTE — ED Notes (Signed)
 Pt reports feeling nauseous, unable to eat, sweats, tremors and headache.

## 2023-09-25 NOTE — B[facility] Counselor (Deleted)
@  7:46 pm, requested patient's nurse Rudolph Cost, RN) to set up the TTS machine for patient's initial TTS assessment.

## 2023-09-25 NOTE — ED Notes (Signed)
 This Nurse received a call from [facility]. Per admission nurse pt last CIWA is too high to be accepted.

## 2023-09-25 NOTE — ED Notes (Signed)
 This Nurse spoke to [place] at [employer], Diego Foy stated pt should be monitored until 0100 04/26 which will mark a complete 24hrs. Please update poison control if pt status change/deteriorate

## 2023-09-25 NOTE — ED Notes (Signed)
 Pt belongings in [place] B cabinet

## 2023-09-25 NOTE — ED Notes (Signed)
 This Nurse also received a message from fellow nurse stating [facility] is on hold for possible admission." This Nurse called [facility] back to advised pt has already been accepted.

## 2023-09-26 LAB — RAPID URINE DRUG SCREEN, HOSP PERFORMED
Amphetamines: NOT DETECTED
Barbiturates: NOT DETECTED
Benzodiazepines: NOT DETECTED
Cocaine: NOT DETECTED
Opiates: NOT DETECTED
Tetrahydrocannabinol: POSITIVE — AB

## 2023-09-26 NOTE — ED Provider Notes (Addendum)
 Flowsheet Row Most Recent Value  Section 1: Provider Certification   Patient Condition Patient stabilized, Benefit outweighs risk, No emergency medical condition present  Reason for Transfer psych inpatient treatment  Benefits of Transfer services/supports  Risks of Transfer Decompensation en route  Level of Care Sheriff  Accepting Physician Dionisio Frederic, MD  Sending Physician Jerald Molly MD  What is the patient's mental capacity? (Only Physicians and APPs can determine a patient's mental capacity) Yes patient has capacity for decision  Physician Assessment Stable for transfer    EMTALA section 1   Hadden Steig, Janalyn Me, MD 09/26/23 1027    Arvilla Birmingham, MD 09/26/23 1108

## 2023-09-26 NOTE — ED Provider Notes (Signed)
 Emergency Medicine Observation Re-evaluation Note  Raymond [name] is a 43 y.o. male, seen on rounds today.  Pt initially presented to the ED for complaints of Suicidal Currently, the patient is resting.  Physical Exam  BP (!) 135/95 (BP Location: Right Arm)   Pulse 74   Temp 98.1 F (36.7 C) (Oral)   Resp 20   Ht 6\' 1"  (1.854 m)   Wt 77.2 kg   SpO2 99%   BMI 22.45 kg/m  Physical Exam General: NAD Cardiac: Regular HR Lungs: No respiratory distress   ED Course / MDM  EKG:EKG Interpretation Date/Time:  Friday September 25 2023 22:28:22 EDT Ventricular Rate:  61 PR Interval:  168 QRS Duration:  106 QT Interval:  404 QTC Calculation: 406 R Axis:   16  Text Interpretation: Normal sinus rhythm Incomplete right bundle branch block Borderline ECG When compared with ECG of 25-Sep-2023 16:15, No significant change was found Confirmed by Alissa April ([id number]) on 09/26/2023 12:40:18 AM  I have reviewed the labs performed to date as well as medications administered while in observation.    Plan  Current plan is for likely psychiatric hospitalization   Patient observed following potential overdose on reported pills on arrival, though vitals and mentation reportedly stable throughout stay.  Patient is medically cleared for psychiatric services    Arvilla Birmingham, MD 09/26/23 [phone]

## 2024-03-01 ENCOUNTER — Ambulatory Visit (HOSPITAL_COMMUNITY): Admission: EM | Admit: 2024-03-01 | Discharge: 2024-03-01

## 2024-03-01 ENCOUNTER — Other Ambulatory Visit: Payer: Self-pay

## 2024-03-01 ENCOUNTER — Emergency Department (HOSPITAL_COMMUNITY)
Admission: EM | Admit: 2024-03-01 | Discharge: 2024-03-02 | Disposition: A | Discharge status: Other Acute Inpatient Hospital | Payer: MEDICAID | Attending: Student in an Organized Health Care Education/Training Program | Admitting: Student in an Organized Health Care Education/Training Program

## 2024-03-01 DIAGNOSIS — R7989 Other specified abnormal findings of blood chemistry: Secondary | ICD-10-CM | POA: Insufficient documentation

## 2024-03-01 DIAGNOSIS — R7401 Elevation of levels of liver transaminase levels: Secondary | ICD-10-CM | POA: Insufficient documentation

## 2024-03-01 DIAGNOSIS — Z91010 Allergy to peanuts: Secondary | ICD-10-CM | POA: Insufficient documentation

## 2024-03-01 DIAGNOSIS — T50902A Poisoning by unspecified drugs, medicaments and biological substances, intentional self-harm, initial encounter: Secondary | ICD-10-CM

## 2024-03-01 DIAGNOSIS — F10120 Alcohol abuse with intoxication, uncomplicated: Secondary | ICD-10-CM | POA: Insufficient documentation

## 2024-03-01 DIAGNOSIS — Y908 Blood alcohol level of 240 mg/100 ml or more: Secondary | ICD-10-CM | POA: Insufficient documentation

## 2024-03-01 DIAGNOSIS — F10920 Alcohol use, unspecified with intoxication, uncomplicated: Secondary | ICD-10-CM

## 2024-03-01 DIAGNOSIS — R45851 Suicidal ideations: Secondary | ICD-10-CM | POA: Diagnosis not present

## 2024-03-01 DIAGNOSIS — T40712A Poisoning by cannabis, intentional self-harm, initial encounter: Secondary | ICD-10-CM | POA: Diagnosis present

## 2024-03-01 LAB — RAPID URINE DRUG SCREEN, HOSP PERFORMED
Amphetamines: NOT DETECTED
Barbiturates: NOT DETECTED
Benzodiazepines: NOT DETECTED
Cocaine: NOT DETECTED
Opiates: NOT DETECTED
Tetrahydrocannabinol: POSITIVE — AB

## 2024-03-01 LAB — CBC
HCT: 53.5 % — ABNORMAL HIGH (ref 39.0–52.0)
Hemoglobin: 18.2 g/dL — ABNORMAL HIGH (ref 13.0–17.0)
MCH: 31.9 pg (ref 26.0–34.0)
MCHC: 34 g/dL (ref 30.0–36.0)
MCV: 93.7 fL (ref 80.0–100.0)
Platelets: 269 K/uL (ref 150–400)
RBC: 5.71 MIL/uL (ref 4.22–5.81)
RDW: 12.9 % (ref 11.5–15.5)
WBC: 5.5 K/uL (ref 4.0–10.5)
nRBC: 0 % (ref 0.0–0.2)

## 2024-03-01 LAB — MAGNESIUM: Magnesium: 2.2 mg/dL (ref 1.7–2.4)

## 2024-03-01 LAB — COMPREHENSIVE METABOLIC PANEL WITH GFR
ALT: 155 U/L — ABNORMAL HIGH (ref 0–44)
AST: 135 U/L — ABNORMAL HIGH (ref 15–41)
Albumin: 4.4 g/dL (ref 3.5–5.0)
Alkaline Phosphatase: 82 U/L (ref 38–126)
Anion gap: 13 (ref 5–15)
BUN: 6 mg/dL (ref 6–20)
CO2: 17 mmol/L — ABNORMAL LOW (ref 22–32)
Calcium: 8.9 mg/dL (ref 8.9–10.3)
Chloride: 109 mmol/L (ref 98–111)
Creatinine, Ser: 0.62 mg/dL (ref 0.61–1.24)
GFR, Estimated: >60 mL/min (ref >60)
Glucose, Bld: 85 mg/dL (ref 70–99)
Potassium: 4 mmol/L (ref 3.5–5.1)
Sodium: 139 mmol/L (ref 135–145)
Total Bilirubin: 0.6 mg/dL (ref 0.0–1.2)
Total Protein: 8.2 g/dL — ABNORMAL HIGH (ref 6.5–8.1)

## 2024-03-01 LAB — ACETAMINOPHEN LEVEL: Acetaminophen (Tylenol), Serum: <10 ug/mL — ABNORMAL LOW (ref 10–30)

## 2024-03-01 LAB — SALICYLATE LEVEL: Salicylate Lvl: <7 mg/dL — ABNORMAL LOW (ref 7.0–30.0)

## 2024-03-01 LAB — ETHANOL: Alcohol, Ethyl (B): 243 mg/dL — ABNORMAL HIGH (ref <15)

## 2024-03-01 MED ORDER — THIAMINE MONONITRATE 100 MG PO TABS
100.0000 mg | ORAL_TABLET | Freq: Every day | ORAL | Status: DC
  Administered 2024-03-01 – 2024-03-02 (×2): 100 mg via ORAL
  Filled 2024-03-01 (×2): qty 1

## 2024-03-01 MED ORDER — LORAZEPAM 1 MG PO TABS: 0.0000 mg | ORAL_TABLET | Freq: Two times a day (BID) | ORAL | Status: DC

## 2024-03-01 MED ORDER — LORAZEPAM 1 MG PO TABS
0.0000 mg | ORAL_TABLET | Freq: Four times a day (QID) | ORAL | Status: DC
  Administered 2024-03-02: 1 mg via ORAL
  Filled 2024-03-01: qty 1

## 2024-03-01 MED ORDER — LORAZEPAM 2 MG/ML IJ SOLN: 0.0000 mg | Freq: Two times a day (BID) | INTRAMUSCULAR | Status: DC

## 2024-03-01 MED ORDER — LORAZEPAM 2 MG/ML IJ SOLN: 0.0000 mg | Freq: Four times a day (QID) | INTRAMUSCULAR | Status: DC

## 2024-03-01 MED ORDER — THIAMINE HCL 100 MG/ML IJ SOLN: 100.0000 mg | Freq: Every day | INTRAMUSCULAR | Status: DC

## 2024-03-01 NOTE — Discharge Instructions (Addendum)
 Transfer to [facility]

## 2024-03-01 NOTE — ED Notes (Signed)
 Patient belongings labeled and placed in locker in purple pod soiled utility room small locker 3.

## 2024-03-01 NOTE — Progress Notes (Incomplete)
 Pt has been accepted to [facility] . Bed assignment:  Pt meets inpatient criteria per Deland Louder, [facility]   Attending Physician will be Dr. Myrle (Child) Dr. Prentis, Dr. Raliegh    Report can be called to: - Child and Adolescence unit: [phone] -Adult unit: [phone]  Pt can arrive after (pending items are received/pending discharges)  Care Team Notified: [facility] Luke Sprang, RN, Ole Rouleau, RN,   [facility] by Sherrell Culver Np. Dr Donnelly is the attending. 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

## 2024-03-01 NOTE — ED Provider Notes (Signed)
 [place] EMERGENCY DEPARTMENT AT [facility] Provider Note   CSN: [id number] Arrival date & time: 03/01/24  8090    Patient presents with: Drug Overdose and Psychiatric Evaluation   Raymond [name] is a 43 y.o. male here for evaluation of overdose.  Yesterday evening into this morning he states he took a handful of pills with intent to self-harm.  He initially told behavioral health he had taken 70 of his pills or he tells me he did not take that many.  He is unsure which pills he actually took.  He states he had a seizure 3 days ago, has history of seizures.  He was sent here for medical clearance.  States he has been still dealing with the loss of his mother 3 years ago due to cancer.  States he has multiple family problems with baby mama's.  He is typically compliant with his medications.  States he drank alcohol earlier today.  When asked, she states a little bit.  He states he has had multiple inpatient psychiatric admissions over the last year.  Last admitted for overdose in April of 70 pills  He denies headache, nausea and vomiting, chest pain, shortness of breath   HPI     Prior to Admission medications   Medication Sig Start Date End Date Taking? Authorizing Provider  albuterol  (VENTOLIN  HFA) 108 (90 Base) MCG/ACT inhaler Inhale 2 puffs into the lungs every 4 (four) hours as needed for wheezing or shortness of breath. 10/16/22  Yes Clapacs, Norleen DASEN, MD  FLUoxetine  (PROZAC ) 20 MG capsule Take 3 capsules (60 mg total) by mouth daily. 09/09/23  Yes Tingling, Stephanie, PA-C  lacosamide  100 MG TABS Take 1 tablet (100 mg total) by mouth 2 (two) times daily. 09/10/23  Yes Tingling, Corean, PA-C  levETIRAcetam  (KEPPRA ) 1000 MG tablet Take 1,000 mg by mouth 2 (two) times daily. 02/11/24  Yes [provider]  lisinopril  (ZESTRIL ) 10 MG tablet Take 10 mg by mouth daily. for high blood pressure 02/11/24  Yes [provider]  melatonin 5 MG TABS Take 1 tablet (5 mg  total) by mouth at bedtime. 09/09/23  Yes Tingling, Corean, PA-C  pantoprazole  (PROTONIX ) 20 MG tablet Take 1 tablet (20 mg total) by mouth daily. 08/19/23  Yes Ntuen, Tina C, FNP  risperiDONE  (RISPERDAL ) 2 MG tablet Take 1 tablet (2 mg total) by mouth at bedtime. 09/09/23  Yes Tingling, Corean, PA-C  traZODone  (DESYREL ) 100 MG tablet Take 2 tablets (200 mg total) by mouth at bedtime. Patient taking differently: Take 100 mg by mouth at bedtime. 09/09/23  Yes Tingling, Corean, PA-C  docusate sodium  (COLACE) 100 MG capsule Take 1 capsule (100 mg total) by mouth 2 (two) times daily. Patient not taking: Reported on 03/01/2024 08/18/23   Ntuen, Tina C, FNP  nicotine  polacrilex (NICORETTE ) 2 MG gum Take 1 each (2 mg total) by mouth as needed for smoking cessation. Patient not taking: Reported on 03/01/2024 09/09/23   Tingling, Stephanie, PA-C  polyethylene glycol (MIRALAX  / GLYCOLAX ) 17 g packet Take 17 g by mouth daily. Patient not taking: Reported on 03/01/2024 08/19/23   Ntuen, Tina C, FNP  thiamine  (VITAMIN B-1) 100 MG tablet Take 1 tablet (100 mg total) by mouth daily. Patient not taking: Reported on 03/01/2024 09/10/23   Tingling, Corean, PA-C  vitamin D3 (CHOLECALCIFEROL ) 25 MCG tablet Take 1 tablet (1,000 Units total) by mouth daily. Patient not taking: Reported on 03/01/2024 09/09/23   Tingling, Corean, PA-C    Allergies: Peanut  butter flavor [flavoring agent], Other, and Tylenol  [acetaminophen ]    Review of Systems  Constitutional: Negative.   HENT: Negative.    Respiratory: Negative.    Cardiovascular: Negative.   Gastrointestinal: Negative.   Genitourinary: Negative.   Musculoskeletal: Negative.   Skin: Negative.   Neurological: Negative.   Psychiatric/Behavioral:  Positive for self-injury, sleep disturbance and suicidal ideas. Negative for hallucinations.   All other systems reviewed and are negative.   Updated Vital Signs BP (!) 138/103   Pulse 80   Temp 97.9 F (36.6 C)    Resp 18   Ht 6' 1 (1.854 m)   Wt 77 kg   SpO2 98%   BMI 22.40 kg/m   Physical Exam Vitals and nursing note reviewed.  Constitutional:      General: He is not in acute distress.    Appearance: He is well-developed. He is not ill-appearing, toxic-appearing or diaphoretic.     Comments: Smells of EtOH  HENT:     Head: Atraumatic.  Eyes:     Pupils: Pupils are equal, round, and reactive to light.  Cardiovascular:     Rate and Rhythm: Normal rate and regular rhythm.     Pulses: Normal pulses.     Heart sounds: Normal heart sounds.  Pulmonary:     Effort: Pulmonary effort is normal. No respiratory distress.     Breath sounds: Normal breath sounds.  Abdominal:     General: Bowel sounds are normal. There is no distension.     Palpations: Abdomen is soft.     Tenderness: There is no abdominal tenderness. There is no guarding or rebound.  Musculoskeletal:        General: Normal range of motion.     Cervical back: Normal range of motion and neck supple.  Skin:    General: Skin is warm and dry.     Capillary Refill: Capillary refill takes less than 2 seconds.  Neurological:     General: No focal deficit present.     Mental Status: He is alert and oriented to person, place, and time.  Psychiatric:        Mood and Affect: Mood normal.        Speech: Speech normal.        Behavior: Behavior normal. Behavior is cooperative.        Thought Content: Thought content is not paranoid or delusional. Thought content includes suicidal ideation. Thought content does not include homicidal ideation. Thought content includes suicidal plan. Thought content does not include homicidal plan.     Comments: Admits to SI with attempt to overdose yesterday evening     (all labs ordered are listed, but only abnormal results are displayed) Labs Reviewed  COMPREHENSIVE METABOLIC PANEL WITH GFR - Abnormal; Notable for the following components:      Result Value   CO2 17 (*)    Total Protein 8.2 (*)     AST 135 (*)    ALT 155 (*)    All other components within normal limits  ETHANOL - Abnormal; Notable for the following components:   Alcohol, Ethyl (B) 243 (*)    All other components within normal limits  CBC - Abnormal; Notable for the following components:   Hemoglobin 18.2 (*)    HCT 53.5 (*)    All other components within normal limits  RAPID URINE DRUG SCREEN, HOSP PERFORMED - Abnormal; Notable for the following components:   Tetrahydrocannabinol POSITIVE (*)    All other components within  normal limits  SALICYLATE LEVEL - Abnormal; Notable for the following components:   Salicylate Lvl <7.0 (*)    All other components within normal limits  ACETAMINOPHEN  LEVEL - Abnormal; Notable for the following components:   Acetaminophen  (Tylenol ), Serum <10 (*)    All other components within normal limits  MAGNESIUM     EKG: None  Radiology: No results found.   Procedures   Medications Ordered in the ED  LORazepam  (ATIVAN ) injection 0-4 mg ( Intravenous Not Given 03/01/24 2234)    Or  LORazepam  (ATIVAN ) tablet 0-4 mg ( Oral See Alternative 03/01/24 2234)  LORazepam  (ATIVAN ) injection 0-4 mg (has no administration in time range)    Or  LORazepam  (ATIVAN ) tablet 0-4 mg (has no administration in time range)  thiamine  (VITAMIN B1) tablet 100 mg (100 mg Oral Given 03/01/24 2237)    Or  thiamine  (VITAMIN B1) injection 100 mg ( Intravenous See Alternative 03/01/24 4167)    43 year old multiple medical comorbidities here for evaluation of intentional overdose on 70 pills.  States these were variety of his home medications.  He has no complaints currently.  Initially seen [facility] and sent here for medical clearance.  He had similar admission in April.  He admits to multiple stressors at home with baby momma's.  Chronic EtOH user.  Last drink earlier today.  Patient ambulatory here.  Tolerating p.o. intake.  Will plan on labs.  Labs personally viewed and interpreted:  CBC without  leukocytosis Metabolic panel AST, ALT elevation--reviewed [facility].  He has been followed for this previously.  Recently had ultrasound showed hepatic steatosis, hepatitis panel was negative.  Suspect likely elevation due to chronic EtOH use.  He has no current nausea, vomiting or abdominal pain.  No need to repeat imaging at this time. Magnesium  2.2 Tylenol , aspirin level within normal limits UDS positive for THC Ethanol 243  Given patient ingestion greater than 12 hours ago.  No need for further observation for medical clearance. Poison control called by nursing. Will consult with psychiatry for disposition.  Patient medically cleared.                                  Medical Decision Making Amount and/or Complexity of Data Reviewed External Data Reviewed: labs and notes. Labs: ordered. Decision-making details documented in ED Course.  Risk OTC drugs. Prescription drug management. Decision regarding hospitalization. Diagnosis or treatment significantly limited by social determinants of health.        Final diagnoses:  Intentional overdose, [facility]  Alcoholic intoxication without complication  Elevated LFTs    ED Discharge Orders     None          Raiza Kiesel A, PA-C 03/01/24 2337    Lowther, Amy, DO 03/02/24 1603

## 2024-03-01 NOTE — Progress Notes (Signed)
 SW contacted the [facility] @ [phone] and spoke with Pam. Per Pam, [facility] is currently at capacity and on diversion.
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
## 2024-03-01 NOTE — ED Triage Notes (Signed)
 Pt coming from [facility]. Reportedly took 70 Keppra  pills in the last 2 days in a SI attempt.  Pt awake, alert.

## 2024-03-01 NOTE — ED Notes (Signed)
Patient transferred to [facility] for medical clearance.

## 2024-03-01 NOTE — Progress Notes (Addendum)
 Pt has been accepted to [facility] on 03/01/2024. Bed assignment:324  Pt meets inpatient criteria per Deland Louder, [facility]   Attending Physician will be Dr.Jadepalle    Report can be called to: [phone]  Pt can arrive 0800   Care Team Notified: [facility] Captain [facility] Luke Sprang, RN, Ole Rouleau, RN,

## 2024-03-01 NOTE — Progress Notes (Signed)
   03/01/24 1716  [facility] Triage Screening (Walk-ins at [facility] only)  What Is the Reason for Your Visit/Call Today? Patient presents via [facility] after he called EMS to report he had overdosed.  Patient states he took 44 of my pills, I think Keppra , last night and today.  He reports he has had 3 seizures over the past few days, with last seizure being two days ago.  Patient appears impaired, however reports he only had a 16 oz beer today that a friend gave him.  He states is struggling with the loss of his mother to stage 4 cancer 3 years ago.  He reports he is also grieving the loss of two babies by miscarriage with his ex.  Patient is also reporting he has stage 2 lung cancer.  [facility] and he reports he is compliant with Rx meds (Keppra  and Trazadone).  Patient struggles to engage in assessment, as he is focused on geting something to eat and being admitted to [facility] from here.  When will I go over.? I need to be there.  Patient has multiple inpatient admissions over the past year. He was last admitted to [facility] for an overdose of 70 pills.  Patient denies HI and AVH.  He admits to drinking 1 beer today.  He denies other substance use.  Patient reportedly overdosed, and cannot reliably affirm his safety at this time.  How Long Has This Been Causing You Problems? > than 6 months  Have You Recently Had Any Thoughts About Hurting Yourself? Yes  How long ago did you have thoughts about hurting yourself? Reports he overdosed yesterday and into today - 70 keppra  tablets  Are You Planning to Commit Suicide/Harm Yourself At This time? No  Have you Recently Had Thoughts About Hurting Someone Sherral? No  Are You Planning To Harm Someone At This Time? No  Physical Abuse Denies  Verbal Abuse Denies  Sexual Abuse Denies  Exploitation of patient/patient's resources Denies  Self-Neglect Denies  Possible abuse reported to:  (N/A)  Are you currently experiencing any  auditory, visual or other hallucinations? No  Have You Used Any Alcohol or Drugs in the Past 24 Hours? Yes  What Did You Use and How Much? 1 beer today  Do you have any current medical co-morbidities that require immediate attention? Yes  Please describe current medical co-morbidities that require immediate attention: overdose, reporting seizures over the past few days  Clinician description of patient physical appearance/behavior: Patient appears impaired from ETOH and reported overdose.  He is AAOx3  What Do You Feel Would Help You the Most Today? Treatment for Depression or other mood problem;Alcohol or Drug Use Treatment  If access to [facility] was not available, would you have sought care in the Emergency Department? Yes  Determination of Need Urgent (48 hours)  Options For Referral ED Referral;Inpatient Hospitalization;Medication Management;Outpatient Therapy;Intensive Outpatient Therapy  Determination of Need filed? Yes

## 2024-03-01 NOTE — ED Provider Notes (Signed)
 Pt seen in the [facility] treatment area. Pt states I took 70 pills. Pt states he took 70 pills of all my pills. He states he does not know the quantity of each medication, however, states it was Keppra , Trazodone ,  my blood pressure pill, depression pill, anxiety and PTSD pills. Medication dispense report reviewed. Pt has a history of being on Keppra , Trazodone , prozac , hydroxyzine , lisinopril , prazosin , risperdal , sertraline. Pt states he has a history of seizures and reports last seizure was 2 days ago. He endorses suicidal ideation and auditory hallucinations of my mom who passed away 3 years ago. Denies homicidal ideation, intent or plan. There is an odor of ETOH in the room, however, patient denies use of ETOH, THC, cocaine, methamphetamine, or fentanyl . Pt is requesting to go to [facility] Pt is advised that medical clearance is needed before assessing for inpatient psychiatric hospitalization. Pt becomes agitated stating I'm about to go back to [place], they don't care about you down [place]. It is explained to the pt that due to him voicing ingestion of unknown quantity of substances, this creates a potential risk for medical instability and toxicity. He will be sent to the ER for medical clearance.  1835 this practitioner was advised that pt was refusing to get on the stretcher to be transported to the ER for evaluation. Pt perserverates on going to [facility] and is this how you treat your Veterans? It is explained that he cannot be considered for inpatient admission until he is medically cleared. After some redirection and education on the process of medical clearance, the pt agrees to go to the ED voluntarily.

## 2024-03-01 NOTE — ED Triage Notes (Signed)
 Pt provided with food and drink by EDP. Pt aware that he will have to be monitored for awhile and then evaluated by Psychiatry. Pt states he is feeling better and would like to go home to be with new born baby.

## 2024-03-01 NOTE — ED Notes (Signed)
 Pt reports drinking one beer this AM and smoked weed last night.

## 2024-03-02 ENCOUNTER — Encounter: Payer: Self-pay | Admitting: Psychiatry

## 2024-03-02 ENCOUNTER — Other Ambulatory Visit: Payer: Self-pay

## 2024-03-02 ENCOUNTER — Inpatient Hospital Stay
Admission: AD | Admit: 2024-03-02 | Discharge: 2024-03-14 | DRG: 885 | Disposition: A | Discharge status: Home / Self Care | Source: Intra-hospital | Attending: Psychiatry | Admitting: Psychiatry

## 2024-03-02 DIAGNOSIS — F333 Major depressive disorder, recurrent, severe with psychotic symptoms: Principal | ICD-10-CM | POA: Diagnosis present

## 2024-03-02 DIAGNOSIS — F410 Panic disorder [episodic paroxysmal anxiety] without agoraphobia: Secondary | ICD-10-CM | POA: Diagnosis present

## 2024-03-02 DIAGNOSIS — Z9151 Personal history of suicidal behavior: Secondary | ICD-10-CM

## 2024-03-02 DIAGNOSIS — F4310 Post-traumatic stress disorder, unspecified: Secondary | ICD-10-CM | POA: Diagnosis present

## 2024-03-02 DIAGNOSIS — Z634 Disappearance and death of family member: Secondary | ICD-10-CM | POA: Diagnosis not present

## 2024-03-02 DIAGNOSIS — Z79899 Other long term (current) drug therapy: Secondary | ICD-10-CM | POA: Diagnosis not present

## 2024-03-02 DIAGNOSIS — R030 Elevated blood-pressure reading, without diagnosis of hypertension: Secondary | ICD-10-CM | POA: Diagnosis not present

## 2024-03-02 DIAGNOSIS — F17210 Nicotine dependence, cigarettes, uncomplicated: Secondary | ICD-10-CM | POA: Diagnosis present

## 2024-03-02 DIAGNOSIS — C3490 Malignant neoplasm of unspecified part of unspecified bronchus or lung: Secondary | ICD-10-CM | POA: Diagnosis present

## 2024-03-02 DIAGNOSIS — Z886 Allergy status to analgesic agent status: Secondary | ICD-10-CM

## 2024-03-02 DIAGNOSIS — T40712A Poisoning by cannabis, intentional self-harm, initial encounter: Secondary | ICD-10-CM | POA: Diagnosis not present

## 2024-03-02 DIAGNOSIS — R569 Unspecified convulsions: Secondary | ICD-10-CM | POA: Diagnosis present

## 2024-03-02 DIAGNOSIS — F332 Major depressive disorder, recurrent severe without psychotic features: Secondary | ICD-10-CM | POA: Diagnosis not present

## 2024-03-02 DIAGNOSIS — R45851 Suicidal ideations: Secondary | ICD-10-CM | POA: Diagnosis present

## 2024-03-02 DIAGNOSIS — Z8619 Personal history of other infectious and parasitic diseases: Secondary | ICD-10-CM | POA: Diagnosis not present

## 2024-03-02 DIAGNOSIS — Z91010 Allergy to peanuts: Secondary | ICD-10-CM

## 2024-03-02 MED ORDER — ADULT MULTIVITAMIN W/MINERALS CH
1.0000 | ORAL_TABLET | Freq: Every day | ORAL | Status: DC
Start: 2024-03-02 — End: 2024-03-14
  Administered 2024-03-02 – 2024-03-14 (×13): 1 via ORAL
  Filled 2024-03-02 (×13): qty 1

## 2024-03-02 MED ORDER — LORAZEPAM 1 MG PO TABS: 1.0000 mg | ORAL_TABLET | Freq: Four times a day (QID) | ORAL | Status: AC | PRN

## 2024-03-02 MED ORDER — OLANZAPINE 10 MG IM SOLR: 5.0000 mg | Freq: Three times a day (TID) | INTRAMUSCULAR | Status: DC | PRN

## 2024-03-02 MED ORDER — OLANZAPINE 10 MG IM SOLR: 10.0000 mg | Freq: Three times a day (TID) | INTRAMUSCULAR | Status: DC | PRN

## 2024-03-02 MED ORDER — HYDROXYZINE HCL 25 MG PO TABS
25.0000 mg | ORAL_TABLET | Freq: Four times a day (QID) | ORAL | Status: AC | PRN
  Administered 2024-03-02: 25 mg via ORAL
  Filled 2024-03-02: qty 1

## 2024-03-02 MED ORDER — OLANZAPINE 5 MG PO TBDP: 5.0000 mg | ORAL_TABLET | Freq: Three times a day (TID) | ORAL | Status: DC | PRN

## 2024-03-02 MED ORDER — ONDANSETRON 4 MG PO TBDP
4.0000 mg | ORAL_TABLET | Freq: Four times a day (QID) | ORAL | Status: AC | PRN
  Administered 2024-03-03: 4 mg via ORAL
  Filled 2024-03-02: qty 1

## 2024-03-02 MED ORDER — THIAMINE MONONITRATE 100 MG PO TABS
100.0000 mg | ORAL_TABLET | Freq: Every day | ORAL | Status: DC
  Administered 2024-03-03 – 2024-03-14 (×12): 100 mg via ORAL
  Filled 2024-03-02 (×12): qty 1

## 2024-03-02 MED ORDER — THIAMINE HCL 100 MG/ML IJ SOLN: 100.0000 mg | Freq: Once | INTRAMUSCULAR | Status: DC

## 2024-03-02 MED ORDER — NICOTINE 14 MG/24HR TD PT24
14.0000 mg | MEDICATED_PATCH | Freq: Every day | TRANSDERMAL | Status: DC
  Filled 2024-03-02 (×2): qty 1

## 2024-03-02 MED ORDER — LOPERAMIDE HCL 2 MG PO CAPS: 2.0000 mg | ORAL_CAPSULE | ORAL | Status: AC | PRN

## 2024-03-02 MED ORDER — ALUM & MAG HYDROXIDE-SIMETH 200-200-20 MG/5ML PO SUSP: 30.0000 mL | ORAL | Status: DC | PRN

## 2024-03-02 NOTE — Plan of Care (Signed)
   Problem: Education: Goal: Emotional status will improve Outcome: Progressing   Problem: Activity: Goal: Interest or engagement in activities will improve Outcome: Progressing

## 2024-03-02 NOTE — Progress Notes (Signed)
 43 year old male patient admitted for MDD.Patient appears sad and anxious but cooperative with admission assessment. Patient states  I was thinking about my mother. I was suicidal. I took about 70 pills. Patient denies SI,HI and AVH at this time. Patient states his goal for admission is  need to feel better and try not to hurt myself. Skin assessment and body search done. No contraband found. Made patient comfortable in the unit. Support and encouragement given.

## 2024-03-02 NOTE — Tx Team (Signed)
 Initial Treatment Plan 03/02/2024 5:21 PM Alm Dolores FMW:[id number]    PATIENT STRESSORS: Financial difficulties   Health problems   Substance abuse     PATIENT STRENGTHS: Average or above average intelligence  Capable of independent living  Communication skills    PATIENT IDENTIFIED PROBLEMS: Substance abuse  Suicidal ideations                   DISCHARGE CRITERIA:  Adequate post-discharge living arrangements Verbal commitment to aftercare and medication compliance  PRELIMINARY DISCHARGE PLAN: Attend aftercare/continuing care group Return to previous living arrangement  PATIENT/FAMILY INVOLVEMENT: This treatment plan has been presented to and reviewed with the patient, Raymond [name], and/or family mem  The patient and family have been given the opportunity to ask questions and make suggestions.  Dollie Zachary Sandifer, RN 03/02/2024, 5:21 PM

## 2024-03-02 NOTE — Group Note (Signed)
 [facility] LCSW Group Therapy Note   Group Date: 03/02/2024 Start Time: 1300 End Time: 1350   Type of Therapy/Topic:  Group Therapy:  Emotion Regulation  Participation Level:  Did Not Attend    Description of Group:    The purpose of this group is to assist patients in learning to regulate negative emotions and experience positive emotions. Patients will be guided to discuss ways in which they have been vulnerable to their negative emotions. These vulnerabilities will be juxtaposed with experiences of positive emotions or situations, and patients challenged to use positive emotions to combat negative ones. Special emphasis will be placed on coping with negative emotions in conflict situations, and patients will process healthy conflict resolution skills.  Therapeutic Goals: Patient will identify two positive emotions or experiences to reflect on in order to balance out negative emotions:  Patient will label two or more emotions that they find the most difficult to experience:  Patient will be able to demonstrate positive conflict resolution skills through discussion or role plays:   Summary of Patient Progress: Patient did not attend group.    Therapeutic Modalities:   Cognitive Behavioral Therapy Feelings Identification Dialectical Behavioral Therapy   [name], LCSW

## 2024-03-02 NOTE — ED Notes (Signed)
 Called safe tx to tx pt to [facility] they will call me when they arrive

## 2024-03-02 NOTE — ED Provider Notes (Signed)
 Emergency Medicine Observation Re-evaluation Note  Raymond [name] is a 43 y.o. male, seen on rounds today.  Pt initially presented to the ED for complaints of Drug Overdose and Psychiatric Evaluation Patient presented after polysubstance overdose, was medically cleared.  TOC consulted, patient accepted to [facility], under voluntary status. Currently, the patient is resting comfortably in bed, no questions or concerns.  Physical Exam  BP (!) 125/97   Pulse 72   Temp 98.1 F (36.7 C) (Oral)   Resp 18   Ht 6' 1 (1.854 m)   Wt 77 kg   SpO2 96%   BMI 22.40 kg/m  Physical Exam General: NAD Lungs: Normal effort Psych: Currently calm  ED Course / MDM  EKG:   I have reviewed the labs performed to date as well as medications administered while in observation.  Recent changes in the last 24 hours include, no acute interventions since patient was initially evaluated last night.  Patient was excepted to behavioral health facility:. Pt has been accepted to [facility] on 03/01/2024. Bed assignment:324 Pt meets inpatient criteria per Deland Louder, [facility]  Attending Physician will be Dr.Jadepalle   Report can be called to: [phone] Pt can arrive 0800  Care Team Notified: [facility] Luke Sprang, RN, Ole Rouleau, RN,   Plan  Current plan is for transfer via safe transport to [facility].  EMTALA completed, patient evaluated just prior to transport, amenable with this plan.  Transferred.   Rogelia Jerilynn RAMAN, MD 03/02/24 [phone]

## 2024-03-03 DIAGNOSIS — F333 Major depressive disorder, recurrent, severe with psychotic symptoms: Secondary | ICD-10-CM

## 2024-03-03 MED ORDER — RISPERIDONE 1 MG PO TABS
2.0000 mg | ORAL_TABLET | Freq: Every day | ORAL | Status: DC
  Administered 2024-03-03 – 2024-03-12 (×10): 2 mg via ORAL
  Filled 2024-03-03 (×11): qty 2

## 2024-03-03 MED ORDER — ALBUTEROL SULFATE HFA 108 (90 BASE) MCG/ACT IN AERS
2.0000 | INHALATION_SPRAY | RESPIRATORY_TRACT | Status: DC | PRN
  Administered 2024-03-06 – 2024-03-14 (×9): 2 via RESPIRATORY_TRACT
  Filled 2024-03-03: qty 6.7

## 2024-03-03 MED ORDER — NICOTINE POLACRILEX 2 MG MT GUM
2.0000 mg | CHEWING_GUM | OROMUCOSAL | Status: DC | PRN
  Administered 2024-03-03 – 2024-03-14 (×6): 2 mg via ORAL
  Filled 2024-03-03 (×5): qty 1

## 2024-03-03 MED ORDER — LEVETIRACETAM 500 MG PO TABS
1000.0000 mg | ORAL_TABLET | Freq: Two times a day (BID) | ORAL | Status: DC
  Administered 2024-03-03 – 2024-03-14 (×23): 1000 mg via ORAL
  Filled 2024-03-03 (×24): qty 2

## 2024-03-03 MED ORDER — FLUOXETINE HCL 20 MG PO CAPS
60.0000 mg | ORAL_CAPSULE | Freq: Every day | ORAL | Status: DC
  Administered 2024-03-03 – 2024-03-14 (×12): 60 mg via ORAL
  Filled 2024-03-03 (×12): qty 3

## 2024-03-03 MED ORDER — TRAZODONE HCL 100 MG PO TABS
200.0000 mg | ORAL_TABLET | Freq: Every day | ORAL | Status: DC
  Administered 2024-03-03 – 2024-03-12 (×10): 200 mg via ORAL
  Filled 2024-03-03 (×10): qty 2

## 2024-03-03 MED ORDER — LISINOPRIL 5 MG PO TABS
10.0000 mg | ORAL_TABLET | Freq: Every day | ORAL | Status: DC
Start: 2024-03-03 — End: 2024-03-14
  Administered 2024-03-03 – 2024-03-14 (×12): 10 mg via ORAL
  Filled 2024-03-03 (×12): qty 2
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
## 2024-03-03 NOTE — Progress Notes (Signed)
   03/03/24 1300  Psych Admission Type (Psych Patients Only)  Admission Status Voluntary  Psychosocial Assessment  Patient Complaints Depression  Eye Contact Fair  Facial Expression Animated  Affect Appropriate to circumstance  Speech Logical/coherent  Interaction Assertive  Motor Activity Other (Comment) (appropriate for developmental age)  Appearance/Hygiene Unremarkable  Behavior Characteristics Cooperative  Mood Pleasant  Thought Process  Coherency WDL  Content WDL  Delusions None reported or observed  Perception Hallucinations  Hallucination Visual (Patient reports seeing shadow people.)  Judgment Impaired  Confusion None  Danger to Self  Current suicidal ideation? Denies  Danger to Others  Danger to Others None reported or observed

## 2024-03-03 NOTE — Progress Notes (Signed)
 Takuma continues to report anxiety and depression both rated 6/10 and named the following as trigger: My mother passed away 43 y.o.; lost 2 babies 5 y.o. and two miscarriages.  He denied having any relationship currently.  Pt denied SI/HI and AVH.   03/03/24 0000  Psych Admission Type (Psych Patients Only)  Admission Status Voluntary  Psychosocial Assessment  Patient Complaints Anger;Anxiety;Depression  Eye Contact Fair  Facial Expression Sad  Affect Anxious  Speech Logical/coherent  Interaction Assertive  Motor Activity Slow  Appearance/Hygiene Unremarkable  Behavior Characteristics Appropriate to situation;Cooperative  Mood Depressed;Anxious  Thought Process  Coherency WDL  Content WDL  Delusions None reported or observed  Perception WDL  Hallucination None reported or observed  Judgment Limited  Confusion WDL  Danger to Self  Current suicidal ideation? Denies  Agreement Not to Harm Self Yes  Description of Agreement Verbal  Danger to Others  Danger to Others None reported or observed

## 2024-03-03 NOTE — Progress Notes (Signed)
 Pt calm and pleasant during assessment denying SI/HI/AVH. Pt observed interacting appropriately with staff and peers on the unit. Pt compliant with medication administration per MD orders. Pt given education, support, and encouragement to be active in his treatment plan. Pt being monitored Q 15 minutes for safety per unit protocol, remains safe on the unit

## 2024-03-03 NOTE — Plan of Care (Signed)
   Problem: Education: Goal: Knowledge of Raymond [name] General Education information/materials will improve Outcome: Progressing Goal: Emotional status will improve Outcome: Progressing Goal: Mental status will improve Outcome: Progressing Goal: Verbalization of understanding the information provided will improve Outcome: Progressing   Problem: Activity: Goal: Interest or engagement in activities will improve Outcome: Progressing Goal: Sleeping patterns will improve Outcome: Progressing   Problem: Coping: Goal: Ability to verbalize frustrations and anger appropriately will improve Outcome: Progressing Goal: Ability to demonstrate self-control will improve Outcome: Progressing
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
## 2024-03-03 NOTE — Group Note (Signed)
 Date:  03/03/2024 Time:  1:38 AM  Group Topic/Focus:  Wrap-Up Group:   The focus of this group is to help patients review their daily goal of treatment and discuss progress on daily workbooks.    Participation Level:  Active  Participation Quality:  Appropriate  Affect:  Appropriate  Cognitive:  Appropriate  Insight: Good  Engagement in Group:  Engaged  Modes of Intervention:  Activity  Additional Comments:    Wakhaila H Jalayne Ganesh 03/03/2024, 1:38 AM

## 2024-03-03 NOTE — Group Note (Signed)
 Recreation Therapy Group Note   Group Topic:Health and Wellness  Group Date: 03/03/2024 Start Time: 1000 End Time: 1055 Facilitators: Celestia Jeoffrey BRAVO, LRT, CTRS Location: Courtyard  Group Description: [employer]. LRT and patients played games of basketball, drew with chalk, and played corn hole while outside in the courtyard while getting fresh air and sunlight. Music was being played in the background. LRT and peers conversed about different games they have played before, what they do in their free time and anything else that is on their minds. LRT encouraged pts to drink water after being outside, sweating and getting their heart rate up.  Goal Area(s) Addressed: Patient will build on frustration tolerance skills. Patients will partake in a competitive play game with peers. Patients will gain knowledge of new leisure interest/hobby.    Affect/Mood: Appropriate   Participation Level: Active and Engaged   Participation Quality: Independent   Behavior: Appropriate, Calm, and Cooperative   Speech/Thought Process: Coherent   Insight: Good   Judgement: Good   Modes of Intervention: Education, Exploration, Music, Rapport Building, and Socialization   Patient Response to Interventions:  Attentive, Engaged, Interested , and Receptive   Education Outcome:  Acknowledges education   Clinical Observations/Individualized Feedback: Raymond [name] was active in their participation of session activities and group discussion. Pt interacted well with LRT and peers duration of session.    Plan: Continue to engage patient in RT group sessions 2-3x/week.   Jeoffrey BRAVO Celestia, LRT, CTRS 03/03/2024 1:04 PM
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
## 2024-03-03 NOTE — Plan of Care (Signed)

## 2024-03-03 NOTE — Group Note (Signed)
 LCSW Group Therapy Note  Group Date: 03/03/2024 Start Time: 1315 End Time: 1400   Type of Therapy and Topic:  Group Therapy - How To Cope with Nervousness about Discharge   Participation Level:  Active   Description of Group This process group involved identification of patients' feelings about discharge. Some of them are scheduled to be discharged soon, while others are new admissions, but each of them was asked to share thoughts and feelings surrounding discharge from the hospital. One common theme was that they are excited at the prospect of going home, while another was that many of them are apprehensive about sharing why they were hospitalized. Patients were given the opportunity to discuss these feelings with their peers in preparation for discharge.  Therapeutic Goals  Patient will identify their overall feelings about pending discharge. Patient will think about how they might proactively address issues that they believe will once again arise once they get home (i.e. with parents). Patients will participate in discussion about having hope for change.   Summary of Patient Progress:   Patient was active throughout the session. He demonstrated poor insight into the subject matter, and proved open to input from peers and feedback from CSW. He was respectful of peers and participated throughout the entire session.   Therapeutic Modalities Cognitive Behavioral Therapy   Sherryle JINNY Margo, LCSW 03/03/2024  3:16 PM

## 2024-03-03 NOTE — Group Note (Signed)
 Date:  03/03/2024 Time:  4:40 PM  Group Topic/Focus:  Wellness Toolbox:   The focus of this group is to discuss various aspects of wellness, balancing those aspects and exploring ways to increase the ability to experience wellness.  Patients will create a wellness toolbox for use upon discharge.    Participation Level:  Active  Participation Quality:  Appropriate  Affect:  Appropriate  Cognitive:  Appropriate  Insight: Appropriate  Engagement in Group:  Engaged  Modes of Intervention:  Activity and Socialization  Additional Comments:    Deitra Caron Mainland 03/03/2024, 4:40 PM

## 2024-03-03 NOTE — BHH Suicide Risk Assessment (Signed)
 [facility] Admission Suicide Risk Assessment   Nursing information obtained from:  Patient Demographic factors:  Male, Living alone, Unemployed Current Mental Status:  NA Loss Factors:  NA Historical Factors:  Impulsivity Risk Reduction Factors:  NA  Total Time spent with patient: 30 minutes Principal Problem: MDD (major depressive disorder), recurrent, severe, with psychosis (HCC) Diagnosis:  Principal Problem:   MDD (major depressive disorder), recurrent, severe, with psychosis (HCC)  Subjective Data: Patient is a 43 year old male with a history of Major Depressive Disorder, recurrent, severe w/o psychotic fx who presents voluntarily to [facility] for assessment.  Patient presents via [employer] after he called EMS to report he had overdosed.  Patient states he took 89 of my pills, I think Keppra , last night and today.  He reports he has had 3 seizures over the past few days, with last seizure being two days ago. Patient is admitted to adult psych unit with Q15 min safety monitoring. Multidisciplinary team approach is offered. Medication management; group/milieu therapy is offered.   Continued Clinical Symptoms:  Alcohol Use Disorder Identification Test Final Score (AUDIT): 6 The Alcohol Use Disorders Identification Test, Guidelines for Use in Primary Care, Second Edition.  World Science writer [facility]). Score between 0-7:  no or low risk or alcohol related problems. Score between 8-15:  moderate risk of alcohol related problems. Score between 16-19:  high risk of alcohol related problems. Score 20 or above:  warrants further diagnostic evaluation for alcohol dependence and treatment.   CLINICAL FACTORS:   Depression:   Impulsivity   Musculoskeletal: Strength & Muscle Tone: within normal limits Gait & Station: normal Patient leans: Right  Psychiatric Specialty Exam:  Presentation  General Appearance:  Appropriate for Environment; Casual  Eye  Contact: Fair  Speech: Clear and Coherent  Speech Volume: Normal  Handedness: Right   Mood and Affect  Mood: Depressed; Anxious  Affect: Depressed; Flat   Thought Process  Thought Processes: Coherent  Descriptions of Associations:Intact  Orientation:Full (Time, Place and Person)  Thought Content:Illogical  History of Schizophrenia/Schizoaffective disorder:No  Duration of Psychotic Symptoms:No data recorded Hallucinations:Hallucinations: None  Ideas of Reference:None  Suicidal Thoughts:Suicidal Thoughts: Yes, Passive  Homicidal Thoughts:Homicidal Thoughts: No   Sensorium  Memory: Immediate Fair; Recent Fair; Remote Fair  Judgment: Impaired  Insight: Shallow   Executive Functions  Concentration: Fair  Attention Span: Fair  Recall: [employer] of Knowledge: Fair  Language: Fair   Psychomotor Activity  Psychomotor Activity: Psychomotor Activity: Normal   Assets  Assets: Communication Skills; Desire for Improvement; Physical Health   Sleep  Sleep: Sleep: Fair    Physical Exam: Physical Exam ROS Blood pressure (!) 140/96, pulse 71, temperature (!) 97.3 F (36.3 C), resp. rate 12, height 6' 1 (1.854 m), weight 72.1 kg, SpO2 100%. Body mass index is 20.98 kg/m.   COGNITIVE FEATURES THAT CONTRIBUTE TO RISK:  None    SUICIDE RISK:   Minimal: No identifiable suicidal ideation.  Patients presenting with no risk factors but with morbid ruminations; may be classified as minimal risk based on the severity of the depressive symptoms  PLAN OF CARE: Patient is admitted to adult psych unit with Q15 min safety monitoring. Multidisciplinary team approach is offered. Medication management; group/milieu therapy is offered.   I certify that inpatient services furnished can reasonably be expected to improve the patient's condition.   Allyn Foil, MD 03/03/2024, 1:35 PM

## 2024-03-03 NOTE — Group Note (Signed)
 Date:  03/03/2024 Time:  1:42 PM  Group Topic/Focus:  Goals Group:   The focus of this group is to help patients establish daily goals to achieve during treatment and discuss how the patient can incorporate goal setting into their daily lives to aide in recovery.    Participation Level:  Active  Participation Quality:  Appropriate  Affect:  Appropriate  Cognitive:  Alert  Insight: Appropriate  Engagement in Group:  Engaged  Modes of Intervention:  Activity, Discussion, and Education  Additional Comments:    Skippy LITTIE Bennett 03/03/2024, 1:42 PM

## 2024-03-04 DIAGNOSIS — F333 Major depressive disorder, recurrent, severe with psychotic symptoms: Secondary | ICD-10-CM | POA: Diagnosis not present

## 2024-03-04 NOTE — Progress Notes (Signed)
 [facility] MD Progress Note  03/04/2024 3:52 PM Raymond [name]  MRN:  [id number] 43 y/o M with a PMH of seizures, PTSD from prior military service in [place], anxiety and depression who presented to the ER after he reported ingesting 70 pills ( including keppra , trazodone  and his BP medicine) over a period of 2 days in a suicide attempt. He reports recent stressors include 3 seizure episodes, 2 panic attacks last month worsening depression and anxiety, multiple life events including his recent diagnoses of lung cancer,  the death of his mother from cancer 3 years ago and  2 miscarriages with his ex girlfriend in 2014.Patient is admitted to adult psych unit with Q15 min safety monitoring. Multidisciplinary team approach is offered. Medication management; group/milieu therapy is offered.   Subjective:  Chart reviewed, case discussed in multidisciplinary meeting, patient seen during rounds.  Patient is noted to be resting in his bed.  He reports that he is upset that when he was trying to borrow $20 from another patient who is getting discharged today staff did not allow it and give the money back.  Patient reports that at [facility] they were allowed to borrow money from patient's.  Provider educated him about the policies and procedures of the inpatient unit.  Patient denies SI/HI/plan but reports being frustrated after the incident.  Patient denies SI/HI/plan.  Patient denies auditory/visual hallucinations   Sleep: Fair  Appetite:  Fair  Past Psychiatric History: see h&P Family History: History reviewed. No pertinent family history. Social History:  Social History   Substance and Sexual Activity  Alcohol Use Yes   Comment: 1 liter bottle of vodka prior to arrival     Social History   Substance and Sexual Activity  Drug Use Yes   Types: Methamphetamines    Social History   Socioeconomic History   Marital status: Single    Spouse name: Not on file   Number of children: Not on file   Years of  education: Not on file   Highest education level: Not on file  Occupational History   Not on file  Tobacco Use   Smoking status: Every Day    Current packs/day: 1.00    Types: Cigarettes    Passive exposure: Current   Smokeless tobacco: Never  Vaping Use   Vaping status: Never Used  Substance and Sexual Activity   Alcohol use: Yes    Comment: 1 liter bottle of vodka prior to arrival   Drug use: Yes    Types: Methamphetamines   Sexual activity: Not Currently  Other Topics Concern   Not on file  Social History Narrative   Not on file   Social Drivers of Health   Financial Resource Strain: Not on File (08/18/2022)   Received from [employer]    Financial Resource Strain: 0  Food Insecurity: No Food Insecurity (03/02/2024)   Hunger Vital Sign    Worried About Running Out of Food in the Last Year: Never true    Ran Out of Food in the Last Year: Never true  Transportation Needs: No Transportation Needs (03/02/2024)   PRAPARE - Administrator, Civil Service (Medical): No    Lack of Transportation (Non-Medical): No  Physical Activity: Not on File (08/18/2022)   Received from [facility]   Physical Activity    Physical Activity: 0  Stress: Not on File (08/18/2022)   Received from [facility]   Stress    Stress: 0  Social Connections:  Not on File (02/15/2023)   Received from [employer] [facility]   Social Connections    Connectedness: 0   Past Medical History:  Past Medical History:  Diagnosis Date   Alcohol abuse    Anxiety    Hepatitis C    PTSD (post-traumatic stress disorder)     Past Surgical History:  Procedure Laterality Date   FACIAL FRACTURE SURGERY     metal plate under right eye    Current Medications: Current Facility-Administered Medications  Medication Dose Route Frequency Provider Last Rate Last Admin   albuterol  (VENTOLIN  HFA) 108 (90 Base) MCG/ACT inhaler 2 puff  2 puff Inhalation Q4H PRN Donnelly Mellow, MD       alum & mag hydroxide-simeth  (MAALOX/MYLANTA) 200-200-20 MG/5ML suspension 30 mL  30 mL Oral Q4H PRN Mannie Jerel PARAS, NP       FLUoxetine  (PROZAC ) capsule 60 mg  60 mg Oral Daily Man Effertz, MD   60 mg at 03/04/24 9178   hydrOXYzine  (ATARAX ) tablet 25 mg  25 mg Oral Q6H PRN Mannie Jerel PARAS, NP   25 mg at 03/02/24 1618   levETIRAcetam  (KEPPRA ) tablet 1,000 mg  1,000 mg Oral BID McLauchlin, Angela, NP   1,000 mg at 03/04/24 0820   lisinopril  (ZESTRIL ) tablet 10 mg  10 mg Oral Daily Roseline Ebarb, MD   10 mg at 03/04/24 9178   loperamide  (IMODIUM ) capsule 2-4 mg  2-4 mg Oral PRN Mannie Jerel PARAS, NP       LORazepam  (ATIVAN ) tablet 1 mg  1 mg Oral Q6H PRN Mannie Jerel PARAS, NP       multivitamin with minerals tablet 1 tablet  1 tablet Oral Daily Mannie Jerel PARAS, NP   1 tablet at 03/04/24 9178   nicotine  polacrilex (NICORETTE ) gum 2 mg  2 mg Oral PRN Karsten Howry, MD   2 mg at 03/03/24 9166   OLANZapine  (ZYPREXA ) injection 10 mg  10 mg Intramuscular TID PRN Mannie Jerel PARAS, NP       OLANZapine  (ZYPREXA ) injection 5 mg  5 mg Intramuscular TID PRN Mannie Jerel PARAS, NP       OLANZapine  zydis (ZYPREXA ) disintegrating tablet 5 mg  5 mg Oral TID PRN Mannie Jerel PARAS, NP       ondansetron  (ZOFRAN -ODT) disintegrating tablet 4 mg  4 mg Oral Q6H PRN Mannie Jerel PARAS, NP   4 mg at 03/03/24 9166   risperiDONE  (RISPERDAL ) tablet 2 mg  2 mg Oral QHS Tajae Rybicki, MD   2 mg at 03/03/24 2119   thiamine  (VITAMIN B1) injection 100 mg  100 mg Intramuscular Once Mannie Jerel PARAS, NP       thiamine  (VITAMIN B1) tablet 100 mg  100 mg Oral Daily Mannie Jerel PARAS, NP   100 mg at 03/04/24 0825   traZODone  (DESYREL ) tablet 200 mg  200 mg Oral QHS Raiya Stainback, MD   200 mg at 03/03/24 2118    Lab Results: No results found for this or any previous visit (from the past 48 hours).  Blood Alcohol level:  Lab Results  Component Value Date   ETH 243 (H) 03/01/2024   ETH 28 (H) 09/25/2023    Metabolic Disorder Labs: Lab Results   Component Value Date   HGBA1C 5.3 08/11/2023   MPG 105.41 08/11/2023   MPG 108.28 03/28/2023   Lab Results  Component Value Date   PROLACTIN 38.0 (H) 09/06/2023   PROLACTIN 12.0 01/22/2023   Lab Results  Component Value Date  CHOL 172 08/11/2023   TRIG 146 08/11/2023   HDL 53 08/11/2023   CHOLHDL 3.2 08/11/2023   VLDL 29 08/11/2023   LDLCALC 90 08/11/2023   LDLCALC 84 03/28/2023    Physical Findings: AIMS:  , ,  ,  ,    CIWA:  CIWA-Ar Total: 1 COWS:      Psychiatric Specialty Exam:  Presentation  General Appearance:  Appropriate for Environment; Casual  Eye Contact: Fair  Speech: Clear and Coherent  Speech Volume: Normal    Mood and Affect  Mood: Depressed; Anxious  Affect: Depressed; Flat   Thought Process  Thought Processes: Coherent  Descriptions of Associations:Intact  Orientation:Full (Time, Place and Person)  Thought Content:Illogical  Hallucinations:Hallucinations: None  Ideas of Reference:None  Suicidal Thoughts:Suicidal Thoughts: Yes, Passive  Homicidal Thoughts:Homicidal Thoughts: No   Sensorium  Memory: Immediate Fair; Recent Fair; Remote Fair  Judgment: Impaired  Insight: Shallow   Executive Functions  Concentration: Fair  Attention Span: Fair  Recall: Fair  Fund of Knowledge: Fair  Language: Fair   Psychomotor Activity  Psychomotor Activity: Psychomotor Activity: Normal  Musculoskeletal: Strength & Muscle Tone: within normal limits Gait & Station: normal Assets  Assets: Manufacturing systems engineer; Desire for Improvement; Physical Health    Physical Exam: Physical Exam ROS Blood pressure (!) 143/105, pulse 89, temperature (!) 97.2 F (36.2 C), resp. rate 18, height 6' 1 (1.854 m), weight 72.1 kg, SpO2 100%. Body mass index is 20.98 kg/m.  Diagnosis: Principal Problem:   MDD (major depressive disorder), recurrent, severe, with psychosis (HCC)  Treatment Plan Summary:   Safety and  Monitoring:             -- Voluntary admission to inpatient psychiatric unit for safety, stabilization and treatment             -- Daily contact with patient to assess and evaluate symptoms and progress in treatment             -- Patient's case to be discussed in multi-disciplinary team meeting             -- Observation Level: q15 minute checks             -- Vital signs:  q12 hours             -- Precautions: suicide, elopement, and assault   2. Psychiatric Diagnoses and Treatment:              Home meds:  prozac  60 mg daily Risperdal  2 mg at bedtime Trazodone  100mg  qhs   -- The risks/benefits/side-effects/alternatives to this medication were discussed in detail with the patient and time was given for questions. The patient consents to medication trial.                -- Metabolic profile and EKG monitoring obtained while on an atypical antipsychotic (BMI: Lipid Panel: HbgA1c: QTc:)              -- Encouraged patient to participate in unit milieu and in scheduled group therapies                            3. Medical Issues Being Addressed:  4. Discharge Planning:   -- Social work and case management to assist with discharge planning and identification of hospital follow-up needs prior to discharge  -- Estimated LOS: 3-4 days  Allyn Foil, MD 03/04/2024, 3:52 PM

## 2024-03-04 NOTE — Group Note (Signed)
 Recreation Therapy Group Note   Group Topic:Leisure Education  Group Date: 03/04/2024 Start Time: 1300 End Time: 1400 Facilitators: Celestia Jeoffrey BRAVO, LRT, CTRS Location: Craft Room  Group Description: Leisure. Patients were given the option to choose from journaling, coloring, drawing, making origami, playing with playdoh, listening to music or singing karaoke. LRT and pts discussed the meaning of leisure, the importance of participating in leisure during their free time/when they're outside of the hospital, as well as how our leisure interests can also serve as coping skills.    Goal Area(s) Addressed:    Patient will identify a current leisure interest.    Patient will learn the definition of "leisure".   Patient will practice making a positive decision.   Patient will have the opportunity to try a new leisure activity.   Patient will communicate with peers and LRT.     Affect/Mood: Appropriate   Participation Level: Active and Engaged   Participation Quality: Independent   Behavior: Appropriate, Calm, and Cooperative   Speech/Thought Process: Coherent   Insight: Good   Judgement: Good   Modes of Intervention: Education, Exploration, and Music   Patient Response to Interventions:  Attentive, Engaged, Interested , and Receptive   Education Outcome:  Acknowledges education   Clinical Observations/Individualized Feedback: Raymond [name] was active in their participation of session activities and group discussion. Pt identified hike and be with family as things he does in his free time. Pt chose to sing karaoke while in group.    Plan: Continue to engage patient in RT group sessions 2-3x/week.   Jeoffrey BRAVO Celestia, LRT, CTRS 03/04/2024 2:11 PM

## 2024-03-04 NOTE — BHH Group Notes (Signed)
 Spirituality Group   Description: Participant directed exploration of values, beliefs and meaning  **Focus on Gratitude: Invited reflection on sources of gratitude (external/internal); goal to invite internal gratitude to foster 1) reconnection with life-giving activities 2) self-compassion.   Following a brief framework of chaplain's role and ground rules of group behavior, participants are invited to share concerns or questions that engage spiritual life. Emphasis placed on common themes and shared experiences and ways to make meaning and clarify living into one's values.   Theory/Process/Goal: Utilize the theoretical framework of group therapy established by Celena Kite, Relational Cultural Theory and Rogerian approaches to facilitate relational empathy and use of the "here and now" to foster reflection, self-awareness, and sharing.   Observations: Raymond [name] was actively engaged in the group discussion. He expressed positive feelings about a recent baptism at his faith community. He continues to process grief around not becoming a parent.  Raymond [name] L. Delores HERO.Div

## 2024-03-04 NOTE — BH IP Treatment Plan (Signed)
 Interdisciplinary Treatment and Diagnostic Plan Update  03/04/2024 Time of Session: 10:41 Raymond [name] MRN: [id number]  Principal Diagnosis: MDD (major depressive disorder), recurrent, severe, with psychosis (HCC)  Secondary Diagnoses: Principal Problem:   MDD (major depressive disorder), recurrent, severe, with psychosis (HCC)   Current Medications:  Current Facility-Administered Medications  Medication Dose Route Frequency Provider Last Rate Last Admin   albuterol  (VENTOLIN  HFA) 108 (90 Base) MCG/ACT inhaler 2 puff  2 puff Inhalation Q4H PRN Jadapalle, Sree, MD       alum & mag hydroxide-simeth (MAALOX/MYLANTA) 200-200-20 MG/5ML suspension 30 mL  30 mL Oral Q4H PRN Mannie Jerel PARAS, NP       FLUoxetine  (PROZAC ) capsule 60 mg  60 mg Oral Daily Jadapalle, Sree, MD   60 mg at 03/04/24 9178   hydrOXYzine  (ATARAX ) tablet 25 mg  25 mg Oral Q6H PRN Mannie Jerel PARAS, NP   25 mg at 03/02/24 1618   levETIRAcetam  (KEPPRA ) tablet 1,000 mg  1,000 mg Oral BID McLauchlin, Angela, NP   1,000 mg at 03/04/24 0820   lisinopril  (ZESTRIL ) tablet 10 mg  10 mg Oral Daily Jadapalle, Sree, MD   10 mg at 03/04/24 9178   loperamide  (IMODIUM ) capsule 2-4 mg  2-4 mg Oral PRN Mannie Jerel PARAS, NP       LORazepam  (ATIVAN ) tablet 1 mg  1 mg Oral Q6H PRN Mannie Jerel PARAS, NP       multivitamin with minerals tablet 1 tablet  1 tablet Oral Daily Mannie Jerel PARAS, NP   1 tablet at 03/04/24 9178   nicotine  polacrilex (NICORETTE ) gum 2 mg  2 mg Oral PRN Jadapalle, Sree, MD   2 mg at 03/03/24 9166   OLANZapine  (ZYPREXA ) injection 10 mg  10 mg Intramuscular TID PRN Mannie Jerel PARAS, NP       OLANZapine  (ZYPREXA ) injection 5 mg  5 mg Intramuscular TID PRN Mannie Jerel PARAS, NP       OLANZapine  zydis (ZYPREXA ) disintegrating tablet 5 mg  5 mg Oral TID PRN Mannie Jerel PARAS, NP       ondansetron  (ZOFRAN -ODT) disintegrating tablet 4 mg  4 mg Oral Q6H PRN Mannie Jerel PARAS, NP   4 mg at 03/03/24 9166   risperiDONE  (RISPERDAL ) tablet 2  mg  2 mg Oral QHS Jadapalle, Sree, MD   2 mg at 03/03/24 2119   thiamine  (VITAMIN B1) injection 100 mg  100 mg Intramuscular Once Mannie Jerel PARAS, NP       thiamine  (VITAMIN B1) tablet 100 mg  100 mg Oral Daily Mannie Jerel PARAS, NP   100 mg at 03/04/24 0825   traZODone  (DESYREL ) tablet 200 mg  200 mg Oral QHS Jadapalle, Sree, MD   200 mg at 03/03/24 2118   PTA Medications: Medications Prior to Admission  Medication Sig Dispense Refill Last Dose/Taking   albuterol  (VENTOLIN  HFA) 108 (90 Base) MCG/ACT inhaler Inhale 2 puffs into the lungs every 4 (four) hours as needed for wheezing or shortness of breath. 6.7 g 0 Past Week   FLUoxetine  (PROZAC ) 20 MG capsule Take 3 capsules (60 mg total) by mouth daily. (Patient taking differently: Take 20 mg by mouth daily.) 90 capsule 0 03/01/2024 Morning   levETIRAcetam  (KEPPRA ) 1000 MG tablet Take 1,000 mg by mouth 2 (two) times daily.   03/01/2024 Morning   lisinopril  (ZESTRIL ) 10 MG tablet Take 10 mg by mouth daily. for high blood pressure   03/01/2024 Morning   risperiDONE  (RISPERDAL ) 2 MG tablet Take 1 tablet (  2 mg total) by mouth at bedtime. (Patient taking differently: Take 1-3 mg by mouth See admin instructions. Take 1/2 tablet by mouth every morning and 1 and 1/2 tablet at night) 30 tablet 0 03/01/2024 Morning   traZODone  (DESYREL ) 100 MG tablet Take 2 tablets (200 mg total) by mouth at bedtime. (Patient taking differently: Take 150 mg by mouth at bedtime.) 60 tablet 0 02/29/2024    Patient Stressors: Financial difficulties   Health problems   Substance abuse    Patient Strengths: Average or above average intelligence  Capable of independent living  Communication skills   Treatment Modalities: Medication Management, Group therapy, Case management,  1 to 1 session with clinician, Psychoeducation, Recreational therapy.   Physician Treatment Plan for Primary Diagnosis: MDD (major depressive disorder), recurrent, severe, with psychosis (HCC) Long Term  Goal(s): Improvement in symptoms so as ready for discharge   Short Term Goals: Ability to identify changes in lifestyle to reduce recurrence of condition will improve Ability to verbalize feelings will improve Ability to disclose and discuss suicidal ideas Ability to demonstrate self-control will improve Ability to identify and develop effective coping behaviors will improve  Medication Management: Evaluate patient's response, side effects, and tolerance of medication regimen.  Therapeutic Interventions: 1 to 1 sessions, Unit Group sessions and Medication administration.  Evaluation of Outcomes: Not Met  Physician Treatment Plan for Secondary Diagnosis: Principal Problem:   MDD (major depressive disorder), recurrent, severe, with psychosis (HCC)  Long Term Goal(s): Improvement in symptoms so as ready for discharge   Short Term Goals: Ability to identify changes in lifestyle to reduce recurrence of condition will improve Ability to verbalize feelings will improve Ability to disclose and discuss suicidal ideas Ability to demonstrate self-control will improve Ability to identify and develop effective coping behaviors will improve     Medication Management: Evaluate patient's response, side effects, and tolerance of medication regimen.  Therapeutic Interventions: 1 to 1 sessions, Unit Group sessions and Medication administration.  Evaluation of Outcomes: Not Met   RN Treatment Plan for Primary Diagnosis: MDD (major depressive disorder), recurrent, severe, with psychosis (HCC) Long Term Goal(s): Knowledge of disease and therapeutic regimen to maintain health will improve  Short Term Goals: Ability to remain free from injury will improve, Ability to verbalize frustration and anger appropriately will improve, Ability to demonstrate self-control, Ability to participate in decision making will improve, Ability to verbalize feelings will improve, Ability to disclose and discuss suicidal  ideas, Ability to identify and develop effective coping behaviors will improve, and Compliance with prescribed medications will improve  Medication Management: RN will administer medications as ordered by provider, will assess and evaluate patient's response and provide education to patient for prescribed medication. RN will report any adverse and/or side effects to prescribing provider.  Therapeutic Interventions: 1 on 1 counseling sessions, Psychoeducation, Medication administration, Evaluate responses to treatment, Monitor vital signs and CBGs as ordered, Perform/monitor CIWA, COWS, AIMS and Fall Risk screenings as ordered, Perform wound care treatments as ordered.  Evaluation of Outcomes: Not Met   LCSW Treatment Plan for Primary Diagnosis: MDD (major depressive disorder), recurrent, severe, with psychosis (HCC) Long Term Goal(s): Safe transition to appropriate next level of care at discharge, Engage patient in therapeutic group addressing interpersonal concerns.  Short Term Goals: Engage patient in aftercare planning with referrals and resources, Increase social support, Increase ability to appropriately verbalize feelings, Increase emotional regulation, Facilitate acceptance of mental health diagnosis and concerns, Identify triggers associated with mental health/substance abuse issues, and Increase skills  for wellness and recovery  Therapeutic Interventions: Assess for all discharge needs, 1 to 1 time with Social worker, Explore available resources and support systems, Assess for adequacy in community support network, Educate family and significant other(s) on suicide prevention, Complete Psychosocial Assessment, Interpersonal group therapy.  Evaluation of Outcomes: Not Met   Progress in Treatment: Attending groups: Yes. Participating in groups: Yes. Taking medication as prescribed: Yes. Toleration medication: Yes. Family/Significant other contact made: No, will contact:  if given  permission.  Patient understands diagnosis: Yes. Discussing patient identified problems/goals with staff: Yes. Medical problems stabilized or resolved: Yes. Denies suicidal/homicidal ideation: Yes. Issues/concerns per patient self-inventory: No. Other: none.  New problem(s) identified: No, Describe:  none identified.  New Short Term/Long Term Goal(s): medication management for mood stabilization; elimination of SI thoughts; development of comprehensive mental wellness plan.  Patient Goals:  I just need to like get my medications. To work on not trying to hurt myself.   Discharge Plan or Barriers: CSW will assist pt with development of an appropriate aftercare/discharge plan.   Reason for Continuation of Hospitalization: Anxiety Depression Medication stabilization Suicidal ideation  Estimated Length of Stay: 1-7 days  Last 3 [place] Suicide Severity Risk Score: Flowsheet Row Admission (Current) from 03/02/2024 in [facility]INE Most recent reading at 03/02/2024  3:37 PM ED from 03/01/2024 in [facility] at [facility] Most recent reading at 03/01/2024  7:15 PM ED from 03/01/2024 in [facility] Most recent reading at 03/01/2024  5:26 PM  C-SSRS RISK CATEGORY Error: Q7 should not be populated when Q6 is No High Risk High Risk    Last PHQ 2/9 Scores:     No data to display          Scribe for Treatment Team: [name], LCSW 03/04/2024 11:19 AM

## 2024-03-04 NOTE — Plan of Care (Signed)

## 2024-03-04 NOTE — Progress Notes (Signed)
   03/04/24 0820  Psych Admission Type (Psych Patients Only)  Admission Status Voluntary  Psychosocial Assessment  Patient Complaints Depression  Eye Contact Fair  Facial Expression Animated  Affect Appropriate to circumstance  Speech Logical/coherent  Interaction Assertive  Motor Activity Other (Comment) (WDL)  Appearance/Hygiene Unremarkable  Behavior Characteristics Cooperative;Appropriate to situation  Mood Pleasant  Thought Process  Coherency WDL  Content WDL  Delusions None reported or observed  Perception WDL  Hallucination None reported or observed  Judgment Limited  Confusion None  Danger to Self  Current suicidal ideation? Denies  Agreement Not to Harm Self Yes  Description of Agreement verbal  Danger to Others  Danger to Others None reported or observed

## 2024-03-04 NOTE — Plan of Care (Signed)
   Problem: Education: Goal: Emotional status will improve Outcome: Progressing Goal: Mental status will improve Outcome: Progressing

## 2024-03-04 NOTE — Group Note (Signed)
 Date:  03/04/2024 Time:  9:00 PM  Group Topic/Focus:  Managing Feelings:   The focus of this group is to identify what feelings patients have difficulty handling and develop a plan to handle them in a healthier way upon discharge.    Participation Level:  Active  Participation Quality:  Appropriate and Attentive  Affect:  Appropriate  Cognitive:  Appropriate  Insight: Appropriate and Good  Engagement in Group:  Engaged  Modes of Intervention:  Discussion  Additional Comments:     Kerri Katz 03/04/2024, 9:00 PM

## 2024-03-04 NOTE — Group Note (Signed)
 Date:  03/04/2024 Time:  5:21 PM  Group Topic/Focus:  Wellness Toolbox:   The focus of this group is to discuss various aspects of wellness, balancing those aspects and exploring ways to increase the ability to experience wellness.  Patients will create a wellness toolbox for use upon discharge.    Participation Level:  Active  Participation Quality:  Appropriate  Affect:  Appropriate  Cognitive:  Appropriate  Insight: Appropriate  Engagement in Group:  Engaged  Modes of Intervention:  Activity and Socialization  Additional Comments:    Deitra Caron [place] 03/04/2024, 5:21 PM

## 2024-03-05 DIAGNOSIS — F333 Major depressive disorder, recurrent, severe with psychotic symptoms: Secondary | ICD-10-CM | POA: Diagnosis not present

## 2024-03-05 NOTE — Progress Notes (Signed)
   03/04/24 2000  Psych Admission Type (Psych Patients Only)  Admission Status Voluntary  Psychosocial Assessment  Patient Complaints Depression  Eye Contact Fair  Facial Expression Animated  Affect Appropriate to circumstance  Speech Logical/coherent  Interaction Assertive  Motor Activity Slow  Appearance/Hygiene Unremarkable  Behavior Characteristics Cooperative;Appropriate to situation  Mood Pleasant  Thought Process  Coherency WDL  Content WDL  Delusions None reported or observed  Perception WDL  Hallucination None reported or observed  Judgment Limited  Confusion None  Danger to Self  Current suicidal ideation? Denies  Agreement Not to Harm Self Yes  Description of Agreement verbal  Danger to Others  Danger to Others None reported or observed   No distress noted, thoughts are organized, he appears restless but noted in the millie interacting appropriately with peers and kids. Patient denies SI/HI/AVH, on CIWA no withdrawals noted. 15 minutes safety checks maintained.

## 2024-03-05 NOTE — Group Note (Signed)
 Date:  03/05/2024 Time:  5:18 AM  Group Topic/Focus:  Conflict Resolution:   The focus of this group is to discuss the conflict resolution process and how it may be used upon discharge.    Participation Level:  Active  Participation Quality:  Appropriate  Affect:  Appropriate  Cognitive:  Appropriate  Insight: Appropriate  Engagement in Group:  Engaged  Modes of Intervention:  Discussion  Additional Comments:    Andree Heeg L 03/05/2024, 5:18 AM

## 2024-03-05 NOTE — Progress Notes (Signed)
   03/05/24 1500  Psych Admission Type (Psych Patients Only)  Admission Status Voluntary  Psychosocial Assessment  Patient Complaints Depression  Eye Contact Fair  Facial Expression Animated  Affect Appropriate to circumstance  Speech Logical/coherent  Interaction Assertive  Motor Activity Other (Comment) (appropriate for developmental age)  Appearance/Hygiene Unremarkable  Behavior Characteristics Cooperative  Mood Pleasant;Euthymic  Thought Process  Coherency WDL  Content WDL  Delusions None reported or observed  Perception WDL  Hallucination None reported or observed  Judgment Poor  Confusion None  Danger to Self  Current suicidal ideation? Denies  Danger to Others  Danger to Others None reported or observed

## 2024-03-05 NOTE — Plan of Care (Signed)

## 2024-03-05 NOTE — Progress Notes (Signed)
 Patient ID: Raymond [name], male   DOB: 12-27-1980, 43 y.o.   MRN: [id number] [facility] MD Progress Note  03/05/2024 11:51 AM Raymond [name]  MRN:  [id number] 43 y/o M with a PMH of seizures, PTSD from prior military service in [place], anxiety and depression who presented to the ER after he reported ingesting 70 pills ( including keppra , trazodone  and his BP medicine) over a period of 2 days in a suicide attempt. He reports recent stressors include 3 seizure episodes, 2 panic attacks last month worsening depression and anxiety, multiple life events including his recent diagnoses of lung cancer,  the death of his mother from cancer 3 years ago and  2 miscarriages with his ex girlfriend in 2014.Patient is admitted to adult psych unit with Q15 min safety monitoring. Multidisciplinary team approach is offered. Medication management; group/milieu therapy is offered.   Subjective:  Chart reviewed, case discussed in multidisciplinary meeting, patient seen during rounds. Patient is reassessed on the inpatient unit and explains that he continues to have depression. He has concerns regarding his home medications and medications were reviewed with patient and informed that inpatient medications will be continued at discharge. He continues to endorses passive suicidal thoughts without any plan or intent. Sleep continues to be broken however states that this is his baseline. Patient denies homicidal ideation or AVH at this time. There has been no inappropriate behaviors, seizure activity or use of PRN medications for BH reasons in the past 24 hours. Will continue to monitor improvement and response to treatment plan.    Sleep: Fair  Appetite:  Good  Past Psychiatric History: see h&P Family History: History reviewed. No pertinent family history. Social History:  Social History   Substance and Sexual Activity  Alcohol Use Yes   Comment: 1 liter bottle of vodka prior to arrival     Social History   Substance and Sexual  Activity  Drug Use Yes   Types: Methamphetamines    Social History   Socioeconomic History   Marital status: Single    Spouse name: Not on file   Number of children: Not on file   Years of education: Not on file   Highest education level: Not on file  Occupational History   Not on file  Tobacco Use   Smoking status: Every Day    Current packs/day: 1.00    Types: Cigarettes    Passive exposure: Current   Smokeless tobacco: Never  Vaping Use   Vaping status: Never Used  Substance and Sexual Activity   Alcohol use: Yes    Comment: 1 liter bottle of vodka prior to arrival   Drug use: Yes    Types: Methamphetamines   Sexual activity: Not Currently  Other Topics Concern   Not on file  Social History Narrative   Not on file   Social Drivers of Health   Financial Resource Strain: Not on File (08/18/2022)   Received from [employer]    Financial Resource Strain: 0  Food Insecurity: No Food Insecurity (03/02/2024)   Hunger Vital Sign    Worried About Running Out of Food in the Last Year: Never true    Ran Out of Food in the Last Year: Never true  Transportation Needs: No Transportation Needs (03/02/2024)   PRAPARE - Administrator, Civil Service (Medical): No    Lack of Transportation (Non-Medical): No  Physical Activity: Not on File (08/18/2022)   Received from [facility]   Physical Activity  Physical Activity: 0  Stress: Not on File (08/18/2022)   Received from [facility]   Stress    Stress: 0  Social Connections: Not on File (02/15/2023)   Received from [facility]   Social Connections    Connectedness: 0   Past Medical History:  Past Medical History:  Diagnosis Date   Alcohol abuse    Anxiety    Hepatitis C    PTSD (post-traumatic stress disorder)     Past Surgical History:  Procedure Laterality Date   FACIAL FRACTURE SURGERY     metal plate under right eye    Current Medications: Current Facility-Administered Medications  Medication  Dose Route Frequency Provider Last Rate Last Admin   albuterol  (VENTOLIN  HFA) 108 (90 Base) MCG/ACT inhaler 2 puff  2 puff Inhalation Q4H PRN Jadapalle, Sree, MD       alum & mag hydroxide-simeth (MAALOX/MYLANTA) 200-200-20 MG/5ML suspension 30 mL  30 mL Oral Q4H PRN Mannie Jerel PARAS, NP       FLUoxetine  (PROZAC ) capsule 60 mg  60 mg Oral Daily Jadapalle, Sree, MD   60 mg at 03/05/24 0825   hydrOXYzine  (ATARAX ) tablet 25 mg  25 mg Oral Q6H PRN Mannie Jerel PARAS, NP   25 mg at 03/02/24 1618   levETIRAcetam  (KEPPRA ) tablet 1,000 mg  1,000 mg Oral BID McLauchlin, Jon, NP   1,000 mg at 03/05/24 0825   lisinopril  (ZESTRIL ) tablet 10 mg  10 mg Oral Daily Jadapalle, Sree, MD   10 mg at 03/05/24 9173   loperamide  (IMODIUM ) capsule 2-4 mg  2-4 mg Oral PRN Mannie Jerel PARAS, NP       LORazepam  (ATIVAN ) tablet 1 mg  1 mg Oral Q6H PRN Mannie Jerel PARAS, NP       multivitamin with minerals tablet 1 tablet  1 tablet Oral Daily Mannie Jerel PARAS, NP   1 tablet at 03/05/24 0825   nicotine  polacrilex (NICORETTE ) gum 2 mg  2 mg Oral PRN Jadapalle, Sree, MD   2 mg at 03/03/24 9166   OLANZapine  (ZYPREXA ) injection 10 mg  10 mg Intramuscular TID PRN Mannie Jerel PARAS, NP       OLANZapine  (ZYPREXA ) injection 5 mg  5 mg Intramuscular TID PRN Mannie Jerel PARAS, NP       OLANZapine  zydis (ZYPREXA ) disintegrating tablet 5 mg  5 mg Oral TID PRN Mannie Jerel PARAS, NP       ondansetron  (ZOFRAN -ODT) disintegrating tablet 4 mg  4 mg Oral Q6H PRN Mannie Jerel PARAS, NP   4 mg at 03/03/24 9166   risperiDONE  (RISPERDAL ) tablet 2 mg  2 mg Oral QHS Jadapalle, Sree, MD   2 mg at 03/04/24 2106   thiamine  (VITAMIN B1) injection 100 mg  100 mg Intramuscular Once Mannie Jerel PARAS, NP       thiamine  (VITAMIN B1) tablet 100 mg  100 mg Oral Daily Mannie Jerel PARAS, NP   100 mg at 03/05/24 9173   traZODone  (DESYREL ) tablet 200 mg  200 mg Oral QHS Jadapalle, Sree, MD   200 mg at 03/04/24 2107    Lab Results: No results found for this or any  previous visit (from the past 48 hours).  Blood Alcohol level:  Lab Results  Component Value Date   ETH 243 (H) 03/01/2024   ETH 28 (H) 09/25/2023    Metabolic Disorder Labs: Lab Results  Component Value Date   HGBA1C 5.3 08/11/2023   MPG 105.41 08/11/2023   MPG 108.28 03/28/2023   Lab  Results  Component Value Date   PROLACTIN 38.0 (H) 09/06/2023   PROLACTIN 12.0 01/22/2023   Lab Results  Component Value Date   CHOL 172 08/11/2023   TRIG 146 08/11/2023   HDL 53 08/11/2023   CHOLHDL 3.2 08/11/2023   VLDL 29 08/11/2023   LDLCALC 90 08/11/2023   LDLCALC 84 03/28/2023    Physical Findings: AIMS:  , ,  ,  ,    CIWA:  CIWA-Ar Total: 0 COWS:      Psychiatric Specialty Exam:  Presentation  General Appearance:  Appropriate for Environment  Eye Contact: Good  Speech: Normal Rate  Speech Volume: Normal    Mood and Affect  Mood: Depressed  Affect: Appropriate   Thought Process  Thought Processes: Coherent  Descriptions of Associations:Intact  Orientation:Full (Time, Place and Person)  Thought Content:WDL  Hallucinations:Hallucinations: None  Ideas of Reference:None  Suicidal Thoughts:Suicidal Thoughts: Yes, Passive SI Passive Intent and/or Plan: Without Intent; Without Plan  Homicidal Thoughts:Homicidal Thoughts: No   Sensorium  Memory: Immediate Good; Recent Good; Remote Poor  Judgment: Fair  Insight: Fair   Art therapist  Concentration: Good  Attention Span: Good  Recall: Good  Fund of Knowledge: Good  Language: Good   Psychomotor Activity  Psychomotor Activity: Psychomotor Activity: Normal  Musculoskeletal: Strength & Muscle Tone: within normal limits Gait & Station: normal Assets  Assets: Communication Skills    Physical Exam: Physical Exam Review of Systems  Constitutional: Negative.   HENT: Negative.    Eyes: Negative.   Respiratory: Negative.    Cardiovascular: Negative.    Gastrointestinal: Negative.   Genitourinary: Negative.   Musculoskeletal: Negative.   Skin: Negative.   Neurological: Negative.   Endo/Heme/Allergies: Negative.   Psychiatric/Behavioral:  Positive for depression. The patient has insomnia.    Blood pressure 124/81, pulse 77, temperature (!) 97.5 F (36.4 C), resp. rate 18, height 6' 1 (1.854 m), weight 72.1 kg, SpO2 99%. Body mass index is 20.98 kg/m.  Diagnosis: Principal Problem:   MDD (major depressive disorder), recurrent, severe, with psychosis (HCC)   PLAN: Safety and Monitoring:  -- Voluntary admission to inpatient psychiatric unit for safety, stabilization and treatment  -- Daily contact with patient to assess and evaluate symptoms and progress in treatment  -- Patient's case to be discussed in multi-disciplinary team meeting  -- Observation Level : q15 minute checks  -- Vital signs:  q12 hours  -- Precautions: suicide, elopement, and assault -- Encouraged patient to participate in unit milieu and in scheduled group therapies  2. Psychiatric Diagnoses and Treatment:   Continue fluoxetine  60 mg daily Risperidal 2 mg at bedtime  Trazodone  200 mg at bedtime  Hospital Course:     3. Medical Issues Being Addressed:   None  4. Discharge Planning:   -- Social work and case management to assist with discharge planning and identification of hospital follow-up needs prior to discharge  -- Estimated LOS: 3-4 days  Daine KATHEE Ober, NP 03/05/2024, 11:51 AM

## 2024-03-05 NOTE — Plan of Care (Signed)
  Problem: Education: Goal: Knowledge of [name] Education information/materials will improve Outcome: Progressing   Problem: Education: Goal: Emotional status will improve Outcome: Progressing   Problem: Education: Goal: Mental status will improve Outcome: Progressing   

## 2024-03-06 DIAGNOSIS — F333 Major depressive disorder, recurrent, severe with psychotic symptoms: Secondary | ICD-10-CM | POA: Diagnosis not present

## 2024-03-06 NOTE — Plan of Care (Signed)
  Problem: Education: Goal: Knowledge of [facility] Education information/materials will improve Outcome: Progressing   Problem: Education: Goal: Emotional status will improve Outcome: Progressing   

## 2024-03-06 NOTE — Progress Notes (Signed)
 Patient ID: Raymond [name], male   DOB: [birth year], 43 y.o.   MRN: [id number] [facility] MD Progress Note  03/06/2024 3:20 PM Raymond [name]  MRN:  [id number] 43 y/o M with a PMH of seizures, PTSD from prior military service in [place], anxiety and depression who presented to the ER after he reported ingesting 70 pills ( including keppra , trazodone  and his BP medicine) over a period of 2 days in a suicide attempt. He reports recent stressors include 3 seizure episodes, 2 panic attacks last month worsening depression and anxiety, multiple life events including his recent diagnoses of lung cancer,  the death of his mother from cancer 3 years ago and  2 miscarriages with his ex girlfriend in 2014.Patient is admitted to adult psych unit with Q15 min safety monitoring. Multidisciplinary team approach is offered. Medication management; group/milieu therapy is offered.   03/06/2024: Subjective:  Chart reviewed, case discussed in multidisciplinary meeting, patient seen during rounds. Patient is reassessed on the inpatient unit. He has a bright affect however states that suicidal thoughts are intermittent. Does not appear to be responding internal stimuli. There was a situation on the unit of which he was making inappropriate comments to females in Spanish and was addressed by staff. He states that I meant no harm. There has been no seizure activity. Depressive symptoms are vague at this time. Suicidal thoughts are also passive and denies plan or intent. He is sleeping and eating fine and has bright affect when engaging with peers and staff. He denies HI or AVH.   03/05/2024: Subjective:  Chart reviewed, case discussed in multidisciplinary meeting, patient seen during rounds. Patient is reassessed on the inpatient unit and explains that he continues to have depression. He has concerns regarding his home medications and medications were reviewed with patient and informed that inpatient medications will be continued at discharge. He  continues to endorses passive suicidal thoughts without any plan or intent. Sleep continues to be broken however states that this is his baseline. Patient denies homicidal ideation or AVH at this time. There has been no inappropriate behaviors, seizure activity or use of PRN medications for BH reasons in the past 24 hours. Will continue to monitor improvement and response to treatment plan.    Sleep: Good  Appetite:  Good  Past Psychiatric History: see h&P Family History: History reviewed. No pertinent family history. Social History:  Social History   Substance and Sexual Activity  Alcohol Use Yes   Comment: 1 liter bottle of vodka prior to arrival     Social History   Substance and Sexual Activity  Drug Use Yes   Types: Methamphetamines    Social History   Socioeconomic History   Marital status: Single    Spouse name: Not on file   Number of children: Not on file   Years of education: Not on file   Highest education level: Not on file  Occupational History   Not on file  Tobacco Use   Smoking status: Every Day    Current packs/day: 1.00    Types: Cigarettes    Passive exposure: Current   Smokeless tobacco: Never  Vaping Use   Vaping status: Never Used  Substance and Sexual Activity   Alcohol use: Yes    Comment: 1 liter bottle of vodka prior to arrival   Drug use: Yes    Types: Methamphetamines   Sexual activity: Not Currently  Other Topics Concern   Not on file  Social History Narrative   Not on  file   Social Drivers of Health   Financial Resource Strain: Not on File (08/18/2022)   Received from [employer] Resource Strain: 0  Food Insecurity: No Food Insecurity (03/02/2024)   Hunger Vital Sign    Worried About Running Out of Food in the Last Year: Never true    Ran Out of Food in the Last Year: Never true  Transportation Needs: No Transportation Needs (03/02/2024)   PRAPARE - Administrator, Civil Service  (Medical): No    Lack of Transportation (Non-Medical): No  Physical Activity: Not on File (08/18/2022)   [facility]   Physical Activity    Physical Activity: 0  Stress: Not on File (08/18/2022)   Received from [facility]   Stress    Stress: 0  Social Connections: Not on File (02/15/2023)   Received from [facility]   Social Connections    Connectedness: 0   Past Medical History:  Past Medical History:  Diagnosis Date   Alcohol abuse    Anxiety    Hepatitis C    PTSD (post-traumatic stress disorder)     Past Surgical History:  Procedure Laterality Date   FACIAL FRACTURE SURGERY     metal plate under right eye    Current Medications: Current Facility-Administered Medications  Medication Dose Route Frequency Provider Last Rate Last Admin   albuterol  (VENTOLIN  HFA) 108 (90 Base) MCG/ACT inhaler 2 puff  2 puff Inhalation Q4H PRN Jadapalle, Sree, MD       alum & mag hydroxide-simeth (MAALOX/MYLANTA) 200-200-20 MG/5ML suspension 30 mL  30 mL Oral Q4H PRN Raymond Jerel PARAS, NP       FLUoxetine  (PROZAC ) capsule 60 mg  60 mg Oral Daily Jadapalle, Sree, MD   60 mg at 03/06/24 9145   levETIRAcetam  (KEPPRA ) tablet 1,000 mg  1,000 mg Oral BID McLauchlin, Angela, NP   1,000 mg at 03/06/24 0855   lisinopril  (ZESTRIL ) tablet 10 mg  10 mg Oral Daily Jadapalle, Sree, MD   10 mg at 03/06/24 9145   multivitamin with minerals tablet 1 tablet  1 tablet Oral Daily Raymond Jerel PARAS, NP   1 tablet at 03/06/24 9145   nicotine  polacrilex (NICORETTE ) gum 2 mg  2 mg Oral PRN Jadapalle, Sree, MD   2 mg at 03/03/24 9166   OLANZapine  (ZYPREXA ) injection 10 mg  10 mg Intramuscular TID PRN Raymond Jerel PARAS, NP       OLANZapine  (ZYPREXA ) injection 5 mg  5 mg Intramuscular TID PRN Raymond Jerel PARAS, NP       OLANZapine  zydis (ZYPREXA ) disintegrating tablet 5 mg  5 mg Oral TID PRN Raymond Jerel PARAS, NP       risperiDONE  (RISPERDAL ) tablet 2 mg  2 mg Oral QHS Jadapalle, Sree, MD   2 mg at 03/05/24 2114   thiamine   (VITAMIN B1) injection 100 mg  100 mg Intramuscular Once Raymond Jerel PARAS, NP       thiamine  (VITAMIN B1) tablet 100 mg  100 mg Oral Daily Raymond Jerel PARAS, NP   100 mg at 03/06/24 9144   traZODone  (DESYREL ) tablet 200 mg  200 mg Oral QHS Jadapalle, Sree, MD   200 mg at 03/05/24 2114    Lab Results: No results found for this or any previous visit (from the past 48 hours).  Blood Alcohol level:  Lab Results  Component Value Date   ETH 243 (H) 03/01/2024   ETH 28 (H) 09/25/2023  Metabolic Disorder Labs: Lab Results  Component Value Date   HGBA1C 5.3 08/11/2023   MPG 105.41 08/11/2023   MPG 108.28 03/28/2023   Lab Results  Component Value Date   PROLACTIN 38.0 (H) 09/06/2023   PROLACTIN 12.0 01/22/2023   Lab Results  Component Value Date   CHOL 172 08/11/2023   TRIG 146 08/11/2023   HDL 53 08/11/2023   CHOLHDL 3.2 08/11/2023   VLDL 29 08/11/2023   LDLCALC 90 08/11/2023   LDLCALC 84 03/28/2023    Physical Findings: AIMS:  , ,  ,  ,    CIWA:  CIWA-Ar Total: 0 COWS:      Psychiatric Specialty Exam:  Presentation  General Appearance:  Appropriate for Environment  Eye Contact: Good  Speech: Normal Rate  Speech Volume: Normal    Mood and Affect  Mood: Euthymic  Affect: Appropriate   Thought Process  Thought Processes: Coherent  Descriptions of Associations:Intact  Orientation:Full (Time, Place and Person)  Thought Content:WDL  Hallucinations:Hallucinations: None  Ideas of Reference:None  Suicidal Thoughts:Suicidal Thoughts: Yes, Passive SI Passive Intent and/or Plan: Without Intent; Without Plan  Homicidal Thoughts:Homicidal Thoughts: No   Sensorium  Memory: Immediate Good; Recent Good; Remote Poor  Judgment: Fair  Insight: Fair   Art therapist  Concentration: Good  Attention Span: Good  Recall: Good  Fund of Knowledge: Good  Language: Good   Psychomotor Activity  Psychomotor Activity: Psychomotor  Activity: Normal  Musculoskeletal: Strength & Muscle Tone: within normal limits Gait & Station: normal Assets  Assets: Communication Skills    Physical Exam: Physical Exam Review of Systems  Constitutional: Negative.   HENT: Negative.    Eyes: Negative.   Respiratory: Negative.    Cardiovascular: Negative.   Gastrointestinal: Negative.   Genitourinary: Negative.   Musculoskeletal: Negative.   Skin: Negative.   Neurological: Negative.   Endo/Heme/Allergies: Negative.   Psychiatric/Behavioral:  Positive for depression. The patient has insomnia.    Blood pressure (!) 141/105, pulse 85, temperature (!) 97.3 F (36.3 C), resp. rate 18, height 6' 1 (1.854 m), weight 72.1 kg, SpO2 100%. Body mass index is 20.98 kg/m.  Diagnosis: Principal Problem:   MDD (major depressive disorder), recurrent, severe, with psychosis (HCC)   PLAN: Safety and Monitoring:  -- Voluntary admission to inpatient psychiatric unit for safety, stabilization and treatment  -- Daily contact with patient to assess and evaluate symptoms and progress in treatment  -- Patient's case to be discussed in multi-disciplinary team meeting  -- Observation Level : q15 minute checks  -- Vital signs:  q12 hours  -- Precautions: suicide, elopement, and assault -- Encouraged patient to participate in unit milieu and in scheduled group therapies  2. Psychiatric Diagnoses and Treatment:   Continue fluoxetine  60 mg daily Risperidal 2 mg at bedtime  Trazodone  200 mg at bedtime  Hospital Course:     3. Medical Issues Being Addressed:   None  4. Discharge Planning:   -- Social work and case management to assist with discharge planning and identification of hospital follow-up needs prior to discharge  -- Estimated LOS: 3-4 days  Daine KATHEE Ober, NP 03/06/2024, 3:20 PM

## 2024-03-06 NOTE — Group Note (Signed)
 Date:  03/06/2024 Time:  10:10 PM  Group Topic/Focus:  Wrap-Up Group:   The focus of this group is to help patients review their daily goal of treatment and discuss progress on daily workbooks.    Participation Level:  Active  Participation Quality:  Appropriate and Attentive  Affect:  Appropriate  Cognitive:  Alert and Appropriate  Insight: Appropriate and Good  Engagement in Group:  Distracting and Engaged  Modes of Intervention:  Confrontation  Additional Comments:     Raymond [name] [name] 03/06/2024, 10:10 PM

## 2024-03-06 NOTE — Progress Notes (Signed)
   03/05/24 2000  Psych Admission Type (Psych Patients Only)  Admission Status Voluntary  Psychosocial Assessment  Patient Complaints Depression  Eye Contact Fair  Facial Expression Animated  Affect Appropriate to circumstance  Speech Logical/coherent  Interaction Assertive  Motor Activity Slow  Appearance/Hygiene Unremarkable  Behavior Characteristics Cooperative;Appropriate to situation  Mood Pleasant  Thought Process  Coherency WDL  Content WDL  Delusions None reported or observed  Perception WDL  Hallucination None reported or observed  Judgment Limited  Confusion None  Danger to Self  Current suicidal ideation? Denies  Agreement Not to Harm Self Yes  Description of Agreement verbal  Danger to Others  Danger to Others None reported or observed   Patient alert and oriented x 4, affect is flat but brightens upon approach. Patient's affect is congruent with mood, his thoughts are organized, 15 minutes safety checks maintained.

## 2024-03-06 NOTE — Group Note (Signed)
 Date:  03/06/2024 Time:  2:04 PM  Group Topic/Focus:  Goals Group:   The focus of this group is to help patients establish daily goals to achieve during treatment and discuss how the patient can incorporate goal setting into their daily lives to aide in recovery.  Participation Level:  Active  Participation Quality:  Appropriate  Affect:  Appropriate  Cognitive:  Appropriate  Insight: Appropriate  Engagement in Group:  Engaged  Modes of Intervention:  Activity, Discussion, and Education  Additional Comments:    Joanmarie Tsang A Shannah Conteh 03/06/2024, 2:04 PM

## 2024-03-06 NOTE — Progress Notes (Signed)
   03/06/24 0815  Psych Admission Type (Psych Patients Only)  Admission Status Voluntary  Psychosocial Assessment  Patient Complaints Depression  Eye Contact Fair  Facial Expression Animated  Affect Appropriate to circumstance  Speech Logical/coherent  Interaction Assertive  Motor Activity Other (Comment) (WNL)  Appearance/Hygiene Unremarkable  Behavior Characteristics Cooperative;Appropriate to situation  Mood Pleasant  Thought Process  Coherency WDL  Content WDL  Delusions None reported or observed  Perception WDL  Hallucination None reported or observed  Judgment Limited  Confusion None  Danger to Self  Current suicidal ideation? Denies  Agreement Not to Harm Self Yes  Description of Agreement verbal  Danger to Others  Danger to Others None reported or observed

## 2024-03-06 NOTE — Plan of Care (Signed)
  Problem: Education: Goal: Mental status will improve Outcome: Progressing   

## 2024-03-06 NOTE — Progress Notes (Signed)
   03/06/24 0815  Psych Admission Type (Psych Patients Only)  Admission Status Voluntary  Psychosocial Assessment  Patient Complaints Depression  Eye Contact Fair  Facial Expression Animated  Affect Appropriate to circumstance  Speech Logical/coherent  Interaction Assertive  Motor Activity Other (Comment) (WNL)  Behavior Characteristics Cooperative;Appropriate to situation  Mood Pleasant  Thought Process  Coherency WDL  Content WDL  Delusions None reported or observed  Perception WDL  Hallucination None reported or observed  Judgment Limited  Confusion None  Danger to Self  Current suicidal ideation? Denies  Agreement Not to Harm Self Yes  Description of Agreement verbal  Danger to Others  Danger to Others None reported or observed

## 2024-03-06 NOTE — Group Note (Signed)
 [facility] LCSW Group Therapy Note   Group Date: 03/06/2024 Start Time: 1300 End Time: 1400   Type of Therapy/Topic:  Group Therapy:  Balance in Life  Participation Level:  Active   Description of Group:    This group will address the concept of balance and how it feels and looks when one is unbalanced. Patients will be encouraged to process areas in their lives that are out of balance, and identify reasons for remaining unbalanced. Facilitators will guide patients utilizing problem- solving interventions to address and correct the stressor making their life unbalanced. Understanding and applying boundaries will be explored and addressed for obtaining  and maintaining a balanced life. Patients will be encouraged to explore ways to assertively make their unbalanced needs known to significant others in their lives, using other group members and facilitator for support and feedback.  Therapeutic Goals: Patient will identify two or more emotions or situations they have that consume much of in their lives. Patient will identify signs/triggers that life has become out of balance:  Patient will identify two ways to set boundaries in order to achieve balance in their lives:  Patient will demonstrate ability to communicate their needs through discussion and/or role plays  Summary of Patient Progress:    During group, patients and facilitator engaged in discussion round "healthy and unhealthy habits." Patient shared their personal afflictions with habits as well as brainstormed ideas for healthier coping skills. During group, patient was open to feedback from peers and provided constructive solutions to fellow group members. Patient was engaged, participated and added to positive group environment.      Therapeutic Modalities:   Cognitive Behavioral Therapy Solution-Focused Therapy Assertiveness Training   Rexene LELON Mae, LCSWA

## 2024-03-07 DIAGNOSIS — F333 Major depressive disorder, recurrent, severe with psychotic symptoms: Secondary | ICD-10-CM | POA: Diagnosis not present

## 2024-03-07 MED ORDER — POLYETHYLENE GLYCOL 3350 17 G PO PACK
17.0000 g | PACK | Freq: Every day | ORAL | Status: DC | PRN
  Administered 2024-03-07 – 2024-03-10 (×2): 17 g via ORAL
  Filled 2024-03-07: qty 1

## 2024-03-07 NOTE — Group Note (Signed)
 Recreation Therapy Group Note   Group Topic:General Recreation  Group Date: 03/07/2024 Start Time: 1045 End Time: 1130 Facilitators: Celestia Jeoffrey BRAVO, LRT, CTRS Location: Courtyard  Group Description: [employer]. LRT and patients played games of basketball, drew with chalk, and played corn hole while outside in the courtyard while getting fresh air and sunlight. Music was being played in the background. LRT and peers conversed about different games they have played before, what they do in their free time and anything else that is on their minds. LRT encouraged pts to drink water after being outside, sweating and getting their heart rate up.  Goal Area(s) Addressed: Patient will build on frustration tolerance skills. Patients will partake in a competitive play game with peers. Patients will gain knowledge of new leisure interest/hobby.    Affect/Mood: Appropriate   Participation Level: Active   Participation Quality: Independent   Behavior: Alert and Appropriate   Speech/Thought Process: Coherent   Insight: Good   Judgement: Good   Modes of Intervention: Activity   Patient Response to Interventions:  Receptive   Education Outcome:  Acknowledges education   Clinical Observations/Individualized Feedback: Rohil was active in their participation of session activities and group discussion. Pt interacted well with LRT and peers duration of session.    Plan: Continue to engage patient in RT group sessions 2-3x/week.   Jeoffrey BRAVO Celestia, LRT, CTRS 03/07/2024 11:54 AM

## 2024-03-07 NOTE — Plan of Care (Signed)

## 2024-03-07 NOTE — Progress Notes (Signed)
 Patient ID: Raymond [name], male   DOB: 1980-08-05, 43 y.o.   MRN: [id number] [facility] MD Progress Note  03/07/2024 3:13 PM Raymond [name]  MRN:  [id number] 43 y/o M with a PMH of seizures, PTSD from prior military service in [place], anxiety and depression who presented to the ER after he reported ingesting 70 pills ( including keppra , trazodone  and his BP medicine) over a period of 2 days in a suicide attempt. He reports recent stressors include 3 seizure episodes, 2 panic attacks last month worsening depression and anxiety, multiple life events including his recent diagnoses of lung cancer,  the death of his mother from cancer 3 years ago and  2 miscarriages with his ex girlfriend in 2014.Patient is admitted to adult psych unit with Q15 min safety monitoring. Multidisciplinary team approach is offered. Medication management; group/milieu therapy is offered.   03/07/2024: hart reviewed, case discussed in multidisciplinary meeting, patient seen during rounds. Patient is reassessed on the inpatient unit. He has a bright affect, denies SI/HI/AVH. Notes stable appetite, and sleep. Notes mood lability but is then unable to elaborate. No behavioral prns overnight. They are medication compliant. They are observed in the unit milieu.   03/06/2024: Subjective:  Chart reviewed, case discussed in multidisciplinary meeting, patient seen during rounds. Patient is reassessed on the inpatient unit. He has a bright affect however states that suicidal thoughts are intermittent. Does not appear to be responding internal stimuli. There was a situation on the unit of which he was making inappropriate comments to females in Spanish and was addressed by staff. He states that I meant no harm. There has been no seizure activity. Depressive symptoms are vague at this time. Suicidal thoughts are also passive and denies plan or intent. He is sleeping and eating fine and has bright affect when engaging with peers and staff. He denies HI or AVH.    03/05/2024: Subjective:  Chart reviewed, case discussed in multidisciplinary meeting, patient seen during rounds. Patient is reassessed on the inpatient unit and explains that he continues to have depression. He has concerns regarding his home medications and medications were reviewed with patient and informed that inpatient medications will be continued at discharge. He continues to endorses passive suicidal thoughts without any plan or intent. Sleep continues to be broken however states that this is his baseline. Patient denies homicidal ideation or AVH at this time. There has been no inappropriate behaviors, seizure activity or use of PRN medications for BH reasons in the past 24 hours. Will continue to monitor improvement and response to treatment plan.    Sleep: Good  Appetite:  Good  Past Psychiatric History: see h&P Family History: History reviewed. No pertinent family history. Social History:  Social History   Substance and Sexual Activity  Alcohol Use Yes   Comment: 1 liter bottle of vodka prior to arrival     Social History   Substance and Sexual Activity  Drug Use Yes   Types: Methamphetamines    Social History   Socioeconomic History   Marital status: Single    Spouse name: Not on file   Number of children: Not on file   Years of education: Not on file   Highest education level: Not on file  Occupational History   Not on file  Tobacco Use   Smoking status: Every Day    Current packs/day: 1.00    Types: Cigarettes    Passive exposure: Current   Smokeless tobacco: Never  Vaping Use   Vaping status:  Never Used  Substance and Sexual Activity   Alcohol use: Yes    Comment: 1 liter bottle of vodka prior to arrival   Drug use: Yes    Types: Methamphetamines   Sexual activity: Not Currently  Other Topics Concern   Not on file  Social History Narrative   Not on file   Social Drivers of Health   Financial Resource Strain: Not on File (08/18/2022)   Received  from [employer]    Financial Resource Strain: 0  Food Insecurity: No Food Insecurity (03/02/2024)   Hunger Vital Sign    Worried About Running Out of Food in the Last Year: Never true    Ran Out of Food in the Last Year: Never true  Transportation Needs: No Transportation Needs (03/02/2024)   PRAPARE - Administrator, Civil Service (Medical): No    Lack of Transportation (Non-Medical): No  Physical Activity: Not on File (08/18/2022)   Received from [facility]   Physical Activity    Physical Activity: 0  Stress: Not on File (08/18/2022)   Received from [facility]   Stress    Stress: 0  Social Connections: Not on File (02/15/2023)   Received from [employer]   Social Connections    Connectedness: 0   Past Medical History:  Past Medical History:  Diagnosis Date   Alcohol abuse    Anxiety    Hepatitis C    PTSD (post-traumatic stress disorder)     Past Surgical History:  Procedure Laterality Date   FACIAL FRACTURE SURGERY     metal plate under right eye    Current Medications: Current Facility-Administered Medications  Medication Dose Route Frequency Provider Last Rate Last Admin   albuterol  (VENTOLIN  HFA) 108 (90 Base) MCG/ACT inhaler 2 puff  2 puff Inhalation Q4H PRN Jadapalle, Sree, MD   2 puff at 03/07/24 0826   alum & mag hydroxide-simeth (MAALOX/MYLANTA) 200-200-20 MG/5ML suspension 30 mL  30 mL Oral Q4H PRN Mannie Jerel PARAS, NP       FLUoxetine  (PROZAC ) capsule 60 mg  60 mg Oral Daily Jadapalle, Sree, MD   60 mg at 03/07/24 0827   levETIRAcetam  (KEPPRA ) tablet 1,000 mg  1,000 mg Oral BID McLauchlin, Jon, NP   1,000 mg at 03/07/24 9173   lisinopril  (ZESTRIL ) tablet 10 mg  10 mg Oral Daily Jadapalle, Sree, MD   10 mg at 03/07/24 0827   multivitamin with minerals tablet 1 tablet  1 tablet Oral Daily Mannie Jerel PARAS, NP   1 tablet at 03/07/24 9171   nicotine  polacrilex (NICORETTE ) gum 2 mg  2 mg Oral PRN Jadapalle, Sree, MD   2 mg at 03/03/24 9166    OLANZapine  (ZYPREXA ) injection 10 mg  10 mg Intramuscular TID PRN Mannie Jerel PARAS, NP       OLANZapine  (ZYPREXA ) injection 5 mg  5 mg Intramuscular TID PRN Mannie Jerel PARAS, NP       OLANZapine  zydis (ZYPREXA ) disintegrating tablet 5 mg  5 mg Oral TID PRN Mannie Jerel PARAS, NP       polyethylene glycol (MIRALAX  / GLYCOLAX ) packet 17 g  17 g Oral Daily PRN Evelynne Spiers E, PA-C   17 g at 03/07/24 0831   risperiDONE  (RISPERDAL ) tablet 2 mg  2 mg Oral QHS Jadapalle, Sree, MD   2 mg at 03/06/24 2122   thiamine  (VITAMIN B1) injection 100 mg  100 mg Intramuscular Once Mannie Jerel PARAS, NP  thiamine  (VITAMIN B1) tablet 100 mg  100 mg Oral Daily Mannie Jerel PARAS, NP   100 mg at 03/07/24 0827   traZODone  (DESYREL ) tablet 200 mg  200 mg Oral QHS Jadapalle, Sree, MD   200 mg at 03/06/24 2122    Lab Results: No results found for this or any previous visit (from the past 48 hours).  Blood Alcohol level:  Lab Results  Component Value Date   ETH 243 (H) 03/01/2024   ETH 28 (H) 09/25/2023    Metabolic Disorder Labs: Lab Results  Component Value Date   HGBA1C 5.3 08/11/2023   MPG 105.41 08/11/2023   MPG 108.28 03/28/2023   Lab Results  Component Value Date   PROLACTIN 38.0 (H) 09/06/2023   PROLACTIN 12.0 01/22/2023   Lab Results  Component Value Date   CHOL 172 08/11/2023   TRIG 146 08/11/2023   HDL 53 08/11/2023   CHOLHDL 3.2 08/11/2023   VLDL 29 08/11/2023   LDLCALC 90 08/11/2023   LDLCALC 84 03/28/2023    Physical Findings: AIMS:  , ,  ,  ,    CIWA:  CIWA-Ar Total: 0 COWS:      Psychiatric Specialty Exam:  Presentation  General Appearance:  Appropriate for Environment  Eye Contact: Good  Speech: Normal Rate  Speech Volume: Normal    Mood and Affect  Mood: Euthymic  Affect: Appropriate   Thought Process  Thought Processes: Coherent  Descriptions of Associations:Intact  Orientation:Full (Time, Place and Person)  Thought  Content:WDL  Hallucinations:No data recorded  Ideas of Reference:None  Suicidal Thoughts:No data recorded  Homicidal Thoughts:No data recorded   Sensorium  Memory: Immediate Good; Recent Good; Remote Poor  Judgment: Fair  Insight: Fair   Art therapist  Concentration: Good  Attention Span: Good  Recall: Good  Fund of Knowledge: Good  Language: Good   Psychomotor Activity  Psychomotor Activity: No data recorded  Musculoskeletal: Strength & Muscle Tone: within normal limits Gait & Station: normal Assets  Assets: Communication Skills    Physical Exam: Physical Exam Vitals and nursing note reviewed.  HENT:     Head: Atraumatic.  Eyes:     Extraocular Movements: Extraocular movements intact.  Pulmonary:     Effort: Pulmonary effort is normal.  Neurological:     Mental Status: He is alert and oriented to person, place, and time.    Review of Systems  Psychiatric/Behavioral:  Positive for depression. Negative for hallucinations and suicidal ideas. The patient is nervous/anxious.    Blood pressure 138/89, pulse 78, temperature 97.9 F (36.6 C), temperature source Oral, resp. rate 18, height 6' 1 (1.854 m), weight 72.1 kg, SpO2 100%. Body mass index is 20.98 kg/m.  Diagnosis: Principal Problem:   MDD (major depressive disorder), recurrent, severe, with psychosis (HCC)   PLAN: Safety and Monitoring:  -- Voluntary admission to inpatient psychiatric unit for safety, stabilization and treatment  -- Daily contact with patient to assess and evaluate symptoms and progress in treatment  -- Patient's case to be discussed in multi-disciplinary team meeting  -- Observation Level : q15 minute checks  -- Vital signs:  q12 hours  -- Precautions: suicide, elopement, and assault -- Encouraged patient to participate in unit milieu and in scheduled group therapies  2. Psychiatric Diagnoses and Treatment:   Continue fluoxetine  60 mg daily Risperidal 2  mg at bedtime  Trazodone  200 mg at bedtime   Hospital Course:     3. Medical Issues Being Addressed:   None Albuterol  as  needed Keppra  1000 mg twice daily lisinopril  10 mg daily multivitamin daily thiamine  100 mg daily 4. Discharge Planning:   -- Social work and case management to assist with discharge planning and identification of hospital follow-up needs prior to discharge  -- Estimated LOS: 3-4 days  Donnice FORBES Right, PA-C 03/07/2024, 3:13 PM

## 2024-03-07 NOTE — Group Note (Signed)
 [facility] LCSW Group Therapy Note   Group Date: 03/07/2024 Start Time: 1300 End Time: 1400   Type of Therapy/Topic:  Group Therapy:  Emotion Regulation  Participation Level:  Active   Mood:  Description of Group:    The purpose of this group is to assist patients in learning to regulate negative emotions and experience positive emotions. Patients will be guided to discuss ways in which they have been vulnerable to their negative emotions. These vulnerabilities will be juxtaposed with experiences of positive emotions or situations, and patients challenged to use positive emotions to combat negative ones. Special emphasis will be placed on coping with negative emotions in conflict situations, and patients will process healthy conflict resolution skills.  Therapeutic Goals: Patient will identify two positive emotions or experiences to reflect on in order to balance out negative emotions:  Patient will label two or more emotions that they find the most difficult to experience:  Patient will be able to demonstrate positive conflict resolution skills through discussion or role plays:   Summary of Patient Progress:   During group, patient and group explored the ways in which our thoughts can impact our feelings which impacts our behaviors. Group along with facilitator completed a thermometer activity where different areas of life were explored. Participants were asked to notate in which zone these areas exist in on their personal thermometers. The group then discussed coping skills, and safety plans to help better prepare for potential stressors and learn to better emotionally regulate.     Therapeutic Modalities:   Cognitive Behavioral Therapy Feelings Identification Dialectical Behavioral Therapy   Alveta CHRISTELLA Kerns, LCSW

## 2024-03-07 NOTE — Group Note (Signed)
 Date:  03/07/2024 Time:  9:10 PM  Group Topic/Focus:  Self Care:   The focus of this group is to help patients understand the importance of self-care in order to improve or restore emotional, physical, spiritual, interpersonal, and financial health.    Participation Level:  Active  Participation Quality:  Appropriate  Affect:  Appropriate  Cognitive:  Appropriate  Insight: Appropriate  Engagement in Group:  Engaged  Modes of Intervention:  Discussion  Additional Comments:    Raymond [name] 03/07/2024, 9:10 PM

## 2024-03-07 NOTE — Progress Notes (Signed)
   03/07/24 1000  Psych Admission Type (Psych Patients Only)  Admission Status Voluntary  Psychosocial Assessment  Patient Complaints None  Eye Contact Fair  Facial Expression Animated  Affect Appropriate to circumstance  Speech Logical/coherent  Interaction Assertive  Motor Activity Other (Comment) (appropraite for developmental age)  Appearance/Hygiene Unremarkable  Behavior Characteristics Cooperative  Mood Pleasant;Euthymic  Thought Process  Coherency WDL  Content WDL  Delusions None reported or observed  Perception WDL  Hallucination None reported or observed  Judgment Poor  Confusion None  Danger to Self  Current suicidal ideation? Denies  Danger to Others  Danger to Others None reported or observed

## 2024-03-07 NOTE — Progress Notes (Signed)
   03/06/24 2000  Psych Admission Type (Psych Patients Only)  Admission Status Voluntary  Psychosocial Assessment  Patient Complaints Depression  Eye Contact Fair  Facial Expression Animated  Affect Appropriate to circumstance  Speech Logical/coherent  Interaction Assertive  Motor Activity Slow  Appearance/Hygiene Unremarkable  Behavior Characteristics Cooperative;Appropriate to situation  Mood Pleasant  Thought Process  Coherency WDL  Content WDL  Delusions None reported or observed  Perception WDL  Hallucination None reported or observed  Judgment Limited  Confusion None  Danger to Self  Current suicidal ideation? Denies  Agreement Not to Harm Self Yes  Description of Agreement verbal  Danger to Others  Danger to Others None reported or observed   Patient is alert and oriented x 4, denies SI/HI/AVH interacting appropriately with peers and staff, no distress noted, his thoughts are organized and coherent. 15 minutes safety checks maintained.

## 2024-03-07 NOTE — Plan of Care (Signed)
   Problem: Education: Goal: Emotional status will improve Outcome: Progressing Goal: Mental status will improve Outcome: Progressing

## 2024-03-07 NOTE — Group Note (Signed)
 Recreation Therapy Group Note   Group Topic:Other  Group Date: 03/07/2024 Start Time: 1550 End Time: 1635 Facilitators: Celestia Jeoffrey FORBES ARTICE, CTRS Location: Craft Room  Activity Description/Intervention: Therapeutic Drumming. Patients with peers and staff were given the opportunity to engage in a leader facilitated [employer] Group Empowerment Drumming Circle with staff from the [employer], in partnership with [employer]. Teaching laboratory technician and trained [employer], Norleen Mon leading with LRT observing and documenting intervention and pt response. This evidenced-based practice targets 7 areas of health and wellbeing in the human experience including: stress-reduction, exercise, self-expression, camaraderie/support, nurturing, spirituality, and music-making (leisure).    Goal Area(s) Addresses:  Patient will engage in pro-social way in music group.  Patient will follow directions of drum leader on the first prompt. Patient will demonstrate no behavioral issues during group.  Patient will identify if a reduction in stress level occurs as a result of participation in therapeutic drum circle.     Affect/Mood: Elevated   Participation Level: Active and Hyperverbal   Participation Quality: Minimal Cues    Clinical Observations/Individualized Feedback: Khai was active in their participation of session activities and group discussion.    Plan: Continue to engage patient in RT group sessions 2-3x/week.   Jeoffrey FORBES Celestia, LRT, CTRS 03/07/2024 4:48 PM

## 2024-03-08 DIAGNOSIS — F333 Major depressive disorder, recurrent, severe with psychotic symptoms: Secondary | ICD-10-CM | POA: Diagnosis not present

## 2024-03-08 NOTE — Progress Notes (Signed)
 Patient ID: Kairo Laubacher, male   DOB: 1980/08/15, 43 y.o.   MRN: [id number] [facility] MD Progress Note  03/08/2024 1:23 PM Nameer Summer  MRN:  [id number] 43 y/o M with a PMH of seizures, PTSD from prior military service in [place], anxiety and depression who presented to the ER after he reported ingesting 70 pills ( including keppra , trazodone  and his BP medicine) over a period of 2 days in a suicide attempt. He reports recent stressors include 3 seizure episodes, 2 panic attacks last month worsening depression and anxiety, multiple life events including his recent diagnoses of lung cancer,  the death of his mother from cancer 3 years ago and  2 miscarriages with his ex girlfriend in 2014.Patient is admitted to adult psych unit with Q15 min safety monitoring. Multidisciplinary team approach is offered. Medication management; group/milieu therapy is offered.   03/08/2024: Patient seen for follow-up.  He indicates that he attempted to hang himself in his room last night with his towel around midnight.  Discussed coping mechanisms and he expressed that he was just alone last night but he would be able to use them at home he then has a bizarre discussion about how he has a lot of pills at home and that he lives next to a doctor who will sometimes help him with the pills.  He denies current SI, HI, and AVH.   Given the reports of attempted suicide last night we will place patient on one-to-one.   03/07/2024: hart reviewed, case discussed in multidisciplinary meeting, patient seen during rounds. Patient is reassessed on the inpatient unit. He has a bright affect, denies SI/HI/AVH. Notes stable appetite, and sleep. Notes mood lability but is then unable to elaborate. No behavioral prns overnight. They are medication compliant. They are observed in the unit milieu.   03/06/2024: Subjective:  Chart reviewed, case discussed in multidisciplinary meeting, patient seen during rounds. Patient is reassessed on the inpatient unit. He  has a bright affect however states that suicidal thoughts are intermittent. Does not appear to be responding internal stimuli. There was a situation on the unit of which he was making inappropriate comments to females in Spanish and was addressed by staff. He states that I meant no harm. There has been no seizure activity. Depressive symptoms are vague at this time. Suicidal thoughts are also passive and denies plan or intent. He is sleeping and eating fine and has bright affect when engaging with peers and staff. He denies HI or AVH.   03/05/2024: Subjective:  Chart reviewed, case discussed in multidisciplinary meeting, patient seen during rounds. Patient is reassessed on the inpatient unit and explains that he continues to have depression. He has concerns regarding his home medications and medications were reviewed with patient and informed that inpatient medications will be continued at discharge. He continues to endorses passive suicidal thoughts without any plan or intent. Sleep continues to be broken however states that this is his baseline. Patient denies homicidal ideation or AVH at this time. There has been no inappropriate behaviors, seizure activity or use of PRN medications for BH reasons in the past 24 hours. Will continue to monitor improvement and response to treatment plan.    Sleep: Good  Appetite:  Good  Past Psychiatric History: see h&P Family History: History reviewed. No pertinent family history. Social History:  Social History   Substance and Sexual Activity  Alcohol Use Yes   Comment: 1 liter bottle of vodka prior to arrival     Social History  Substance and Sexual Activity  Drug Use Yes   Types: Methamphetamines    Social History   Socioeconomic History   Marital status: Single    Spouse name: Not on file   Number of children: Not on file   Years of education: Not on file   Highest education level: Not on file  Occupational History   Not on file  Tobacco  Use   Smoking status: Every Day    Current packs/day: 1.00    Types: Cigarettes    Passive exposure: Current   Smokeless tobacco: Never  Vaping Use   Vaping status: Never Used  Substance and Sexual Activity   Alcohol use: Yes    Comment: 1 liter bottle of vodka prior to arrival   Drug use: Yes    Types: Methamphetamines   Sexual activity: Not Currently  Other Topics Concern   Not on file  Social History Narrative   Not on file   Social Drivers of Health   Financial Resource Strain: Not on File (08/18/2022)   Received from [employer]    Financial Resource Strain: 0  Food Insecurity: No Food Insecurity (03/02/2024)   Hunger Vital Sign    Worried About Running Out of Food in the Last Year: Never true    Ran Out of Food in the Last Year: Never true  Transportation Needs: No Transportation Needs (03/02/2024)   PRAPARE - Administrator, Civil Service (Medical): No    Lack of Transportation (Non-Medical): No  Physical Activity: Not on File (08/18/2022)   Received from [facility]   Physical Activity    Physical Activity: 0  Stress: Not on File (08/18/2022)   Received from [facility]   Stress    Stress: 0  Social Connections: Not on File (02/15/2023)   Received from [facility]   Social Connections    Connectedness: 0   Past Medical History:  Past Medical History:  Diagnosis Date   Alcohol abuse    Anxiety    Hepatitis C    PTSD (post-traumatic stress disorder)     Past Surgical History:  Procedure Laterality Date   FACIAL FRACTURE SURGERY     metal plate under right eye    Current Medications: Current Facility-Administered Medications  Medication Dose Route Frequency Provider Last Rate Last Admin   albuterol  (VENTOLIN  HFA) 108 (90 Base) MCG/ACT inhaler 2 puff  2 puff Inhalation Q4H PRN Jadapalle, Sree, MD   2 puff at 03/08/24 0811   alum & mag hydroxide-simeth (MAALOX/MYLANTA) 200-200-20 MG/5ML suspension 30 mL  30 mL Oral Q4H PRN Mannie Jerel PARAS,  NP       FLUoxetine  (PROZAC ) capsule 60 mg  60 mg Oral Daily Jadapalle, Sree, MD   60 mg at 03/08/24 9192   levETIRAcetam  (KEPPRA ) tablet 1,000 mg  1,000 mg Oral BID McLauchlin, Jon, NP   1,000 mg at 03/08/24 0810   lisinopril  (ZESTRIL ) tablet 10 mg  10 mg Oral Daily Jadapalle, Sree, MD   10 mg at 03/08/24 9192   multivitamin with minerals tablet 1 tablet  1 tablet Oral Daily Mannie Jerel PARAS, NP   1 tablet at 03/08/24 9191   nicotine  polacrilex (NICORETTE ) gum 2 mg  2 mg Oral PRN Jadapalle, Sree, MD   2 mg at 03/03/24 9166   OLANZapine  (ZYPREXA ) injection 10 mg  10 mg Intramuscular TID PRN Mannie Jerel PARAS, NP       OLANZapine  (ZYPREXA ) injection 5 mg  5 mg Intramuscular  TID PRN Mannie Jerel PARAS, NP       OLANZapine  zydis (ZYPREXA ) disintegrating tablet 5 mg  5 mg Oral TID PRN Mannie Jerel PARAS, NP       polyethylene glycol (MIRALAX  / GLYCOLAX ) packet 17 g  17 g Oral Daily PRN Keeyon Privitera E, PA-C   17 g at 03/07/24 0831   risperiDONE  (RISPERDAL ) tablet 2 mg  2 mg Oral QHS Jadapalle, Sree, MD   2 mg at 03/07/24 2104   thiamine  (VITAMIN B1) injection 100 mg  100 mg Intramuscular Once Mannie Jerel PARAS, NP       thiamine  (VITAMIN B1) tablet 100 mg  100 mg Oral Daily Mannie Jerel PARAS, NP   100 mg at 03/08/24 9192   traZODone  (DESYREL ) tablet 200 mg  200 mg Oral QHS Jadapalle, Sree, MD   200 mg at 03/07/24 2104    Lab Results: No results found for this or any previous visit (from the past 48 hours).  Blood Alcohol level:  Lab Results  Component Value Date   ETH 243 (H) 03/01/2024   ETH 28 (H) 09/25/2023    Metabolic Disorder Labs: Lab Results  Component Value Date   HGBA1C 5.3 08/11/2023   MPG 105.41 08/11/2023   MPG 108.28 03/28/2023   Lab Results  Component Value Date   PROLACTIN 38.0 (H) 09/06/2023   PROLACTIN 12.0 01/22/2023   Lab Results  Component Value Date   CHOL 172 08/11/2023   TRIG 146 08/11/2023   HDL 53 08/11/2023   CHOLHDL 3.2 08/11/2023   VLDL 29  08/11/2023   LDLCALC 90 08/11/2023   LDLCALC 84 03/28/2023    Physical Findings: AIMS:  , ,  ,  ,    CIWA:  CIWA-Ar Total: 0 COWS:      Psychiatric Specialty Exam:  Presentation  General Appearance:  Appropriate for Environment  Eye Contact: Good  Speech: Normal Rate  Speech Volume: Normal    Mood and Affect  Mood: Euthymic  Affect: Appropriate   Thought Process  Thought Processes: Coherent  Descriptions of Associations:Intact  Orientation:Full (Time, Place and Person)  Thought Content:WDL  Hallucinations:No data recorded  Ideas of Reference:None  Suicidal Thoughts:No data recorded  Homicidal Thoughts:No data recorded   Sensorium  Memory: Immediate Good; Recent Good; Remote Poor  Judgment: Fair  Insight: Fair   Art therapist  Concentration: Good  Attention Span: Good  Recall: Good  Fund of Knowledge: Good  Language: Good   Psychomotor Activity  Psychomotor Activity: No data recorded  Musculoskeletal: Strength & Muscle Tone: within normal limits Gait & Station: normal Assets  Assets: Communication Skills    Physical Exam: Physical Exam Vitals and nursing note reviewed.  HENT:     Head: Atraumatic.  Eyes:     Extraocular Movements: Extraocular movements intact.  Pulmonary:     Effort: Pulmonary effort is normal.  Neurological:     Mental Status: He is alert and oriented to person, place, and time.    Review of Systems  Psychiatric/Behavioral:  Positive for depression. Negative for hallucinations and suicidal ideas. The patient is nervous/anxious.    Blood pressure 118/77, pulse 72, temperature 98.2 F (36.8 C), temperature source Oral, resp. rate 18, height 6' 1 (1.854 m), weight 72.1 kg, SpO2 98%. Body mass index is 20.98 kg/m.  Diagnosis: Principal Problem:   MDD (major depressive disorder), recurrent, severe, with psychosis (HCC)   PLAN: Safety and Monitoring:  -- Voluntary admission to  inpatient psychiatric unit for safety, stabilization  and treatment  -- Daily contact with patient to assess and evaluate symptoms and progress in treatment  -- Patient's case to be discussed in multi-disciplinary team meeting  -- Observation Level : q15 minute checks  -- Vital signs:  q12 hours  -- Precautions: suicide, elopement, and assault -- Encouraged patient to participate in unit milieu and in scheduled group therapies  2. Psychiatric Diagnoses and Treatment: Will refer patient to [facility] facility.   There is some concern for malingering however given the reported gesture will put on 1:1.   Continue fluoxetine  60 mg daily Risperidal 2 mg at bedtime  Trazodone  200 mg at bedtime      3. Medical Issues Being Addressed:   None Albuterol  as needed Keppra  1000 mg twice daily lisinopril  10 mg daily multivitamin daily thiamine  100 mg daily 4. Discharge Planning:   -- Social work and case management to assist with discharge planning and identification of hospital follow-up needs prior to discharge  -- Estimated LOS: 3-4 days  Donnice FORBES Right, PA-C 03/08/2024, 1:23 PM

## 2024-03-08 NOTE — Group Note (Signed)
 Date:  03/08/2024 Time:  9:32 PM  Group Topic/Focus:  Orientation:   The focus of this group is to educate the patient on the purpose and policies of crisis stabilization and provide a format to answer questions about their admission.  The group details unit policies and expectations of patients while admitted. Wrap-Up Group:   The focus of this group is to help patients review their daily goal of treatment and discuss progress on daily workbooks.    Participation Level:  Active  Participation Quality:  Appropriate and Attentive  Affect:  Appropriate  Cognitive:  Appropriate  Insight: Appropriate and Good  Engagement in Group:  Engaged  Modes of Intervention:  Orientation  Additional Comments:     Raymond [name] 03/08/2024, 9:32 PM

## 2024-03-08 NOTE — Group Note (Signed)
 Date:  03/08/2024 Time:  4:22 PM  Group Topic/Focus:  Goals Group:   The focus of this group is to help patients establish daily goals to achieve during treatment and discuss how the patient can incorporate goal setting into their daily lives to aide in recovery.    Participation Level:  Active  Participation Quality:  Appropriate  Affect:  Appropriate  Cognitive:  Appropriate  Insight: Appropriate  Engagement in Group:  Engaged  Modes of Intervention:  Discussion, Education, and Support  Additional Comments:    Deitra Caron Mainland 03/08/2024, 4:22 PM

## 2024-03-08 NOTE — Group Note (Signed)
 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 Patients and LRT discuss what it means to be "at peace", what it feels like physically and mentally. Pts are given a canvas and watercolor paint to use and encouraged to draw their idea of a peaceful place. Pts and LRT discuss how they use this in their daily life post discharge. Pts are encouraged to take their canvas home with them as a reminder to find their peaceful place whenever they are feeling depressed, anxious, etc.    Goal Area(s) Addressed:  Patient will identify what it means to experience a "peaceful" emotion. Patient will identify a new coping skill.  Patient will express their emotions through art. Patients will increase communication by talking with LRT and peers while in group.   Affect/Mood: Appropriate   Participation Level: Active and Engaged   Participation Quality: Independent   Behavior: Appropriate, Calm, and Cooperative   Speech/Thought Process: Coherent   Insight: Good   Judgement: Good   Modes of Intervention: Art   Patient Response to Interventions:  Attentive, Engaged, and Receptive   Education Outcome:  Acknowledges education   Clinical Observations/Individualized Feedback: Jonathin was active in their participation of session activities and group discussion. Pt identified the rose that grew from concrete as his peaceful place.    Plan: Continue to engage patient in RT group sessions 2-3x/week.   Jeoffrey FORBES Celestia, LRT, CTRS 03/08/2024 5:24 PM

## 2024-03-08 NOTE — Group Note (Signed)
 [facility] LCSW Group Therapy Note   Group Date: 03/08/2024 Start Time: 1310 End Time: 1400  Type of Therapy/Topic:  Group Therapy:  Feelings about Diagnosis  Participation Level:  Minimal   Description of Group:    This group will allow patients to explore their thoughts and feelings about diagnoses they have received. Patients will be guided to explore their level of understanding and acceptance of these diagnoses. Facilitator will encourage patients to process their thoughts and feelings about the reactions of others to their diagnosis, and will guide patients in identifying ways to discuss their diagnosis with significant others in their lives. This group will be process-oriented, with patients participating in exploration of their own experiences as well as giving and receiving support and challenge from other group members.   Therapeutic Goals: 1. Patient will demonstrate understanding of diagnosis as evidence by identifying two or more symptoms of the disorder:  2. Patient will be able to express two feelings regarding the diagnosis 3. Patient will demonstrate ability to communicate their needs through discussion and/or role plays  Summary of Patient Progress: Patient was present in group.  Patient participated in group once prompted.  Other times in the group patient required redirection often because he would engage in side conversations with other group members which was disruptive.  Patient displayed poor insight.   Therapeutic Modalities:   Cognitive Behavioral Therapy Brief Therapy Feelings Identification    Sherryle JINNY Margo, LCSW

## 2024-03-08 NOTE — Progress Notes (Addendum)
 1:1 note  10:18 am: Patient placed on 1:1 safety precautions per Raymond [name], [name] due to verbalizing attempt to harm himself last night.   Per patient, he was having thoughts about his two dead babies, dead mother, and my girl that left me. Patient stated these thoughts really got to me and I wrapped a towel around my neck trying to choke myself. Patient stated he eventually stopped himself when he started to think about what he has to live for. Patient denies any current suicidal thoughts with no plan or intent. Patient states I wont do that again. Patient denies HI and A/V/H. Patient verbalized understanding of 1:1 safety precautions and remains safe on unit with sitter by side.   1330: Patient attending social work group. Remains cooperative and safe in unit. 1:1 continues.   1545: Patient currently in group and remains cooperative in unit. 1:1 continues.   1800: Patient currently in dayroom socializing appropriately. Patient remains cooperative and safe in unit with sitter by side.

## 2024-03-08 NOTE — Plan of Care (Signed)

## 2024-03-08 NOTE — BHH Counselor (Signed)
 CSW reached out to [facility], [phone] with the [facility].  CSW left HIPAA compliant voicemail requesting return phone call.   CSW contacted the [facility] about bed availability.  CSW dialed [phone] ext [phone].  CSW was informed NO bed are available.  CSW advised to call daily after 9AM.   Sherryle Margo, MSW, LCSW 03/08/2024 3:20 PM

## 2024-03-08 NOTE — Group Note (Signed)
 Recreation Therapy Group Note   Group Topic:Health and Wellness  Group Date: 03/08/2024 Start Time: 1000 End Time: 1100 Facilitators: Celestia Jeoffrey BRAVO, LRT, CTRS Location: Courtyard  Group Description: [employer]. LRT and patients played games of basketball, drew with chalk, and played corn hole while outside in the courtyard while getting fresh air and sunlight. Music was being played in the background. LRT and peers conversed about different games they have played before, what they do in their free time and anything else that is on their minds. LRT encouraged pts to drink water after being outside, sweating and getting their heart rate up.  Goal Area(s) Addressed: Patient will build on frustration tolerance skills. Patients will partake in a competitive play game with peers. Patients will gain knowledge of new leisure interest/hobby.    Affect/Mood: Appropriate   Participation Level: Active and Engaged   Participation Quality: Independent   Behavior: Appropriate   Speech/Thought Process: Coherent   Insight: Good   Judgement: Fair    Modes of Intervention: Music, Rapport Building, and Socialization   Patient Response to Interventions:  Attentive, Engaged, and Receptive   Education Outcome:  Acknowledges education   Clinical Observations/Individualized Feedback: Aadarsh was active in their participation of session activities and group discussion. Pt interacted well with LRT and peers duration of session.    Plan: Continue to engage patient in RT group sessions 2-3x/week.   Jeoffrey BRAVO Celestia, LRT, CTRS 03/08/2024 11:30 AM

## 2024-03-08 NOTE — Progress Notes (Signed)
 Patient remains on 1:1 observation due to prior stated SUA by wrapping an item around his neck.   03/08/24 2200  Psych Admission Type (Psych Patients Only)  Admission Status Voluntary  Psychosocial Assessment  Patient Complaints None  Eye Contact Fair  Facial Expression Animated  Affect Appropriate to circumstance  Speech Logical/coherent  Interaction Assertive  Motor Activity Other (Comment)  Appearance/Hygiene Unremarkable  Behavior Characteristics Cooperative  Mood Pleasant  Thought Process  Coherency WDL  Content WDL  Delusions None reported or observed  Perception WDL  Hallucination None reported or observed  Judgment Limited  Confusion None  Danger to Self  Current suicidal ideation? Denies  Danger to Others  Danger to Others Reported or observed (1:1 Observation due to prior reported SUA (wrapping item around neck))

## 2024-03-08 NOTE — Plan of Care (Signed)
   Problem: Health Behavior/Discharge Planning: Goal: Compliance with treatment plan for underlying cause of condition will improve Outcome: Progressing   Problem: Safety: Goal: Periods of time without injury will increase Outcome: Progressing

## 2024-03-09 DIAGNOSIS — F333 Major depressive disorder, recurrent, severe with psychotic symptoms: Secondary | ICD-10-CM | POA: Diagnosis not present

## 2024-03-09 NOTE — BH IP Treatment Plan (Signed)
 Interdisciplinary Treatment and Diagnostic Plan Update  03/09/2024 Time of Session: 2:30PM Raymond [name] MRN: [id number]  Principal Diagnosis: MDD (major depressive disorder), recurrent, severe, with psychosis (HCC)  Secondary Diagnoses: Principal Problem:   MDD (major depressive disorder), recurrent, severe, with psychosis (HCC)   Current Medications:  Current Facility-Administered Medications  Medication Dose Route Frequency Provider Last Rate Last Admin   albuterol  (VENTOLIN  HFA) 108 (90 Base) MCG/ACT inhaler 2 puff  2 puff Inhalation Q4H PRN Jadapalle, Sree, MD   2 puff at 03/09/24 0757   alum & mag hydroxide-simeth (MAALOX/MYLANTA) 200-200-20 MG/5ML suspension 30 mL  30 mL Oral Q4H PRN Mannie Jerel PARAS, NP       FLUoxetine  (PROZAC ) capsule 60 mg  60 mg Oral Daily Jadapalle, Sree, MD   60 mg at 03/09/24 9246   levETIRAcetam  (KEPPRA ) tablet 1,000 mg  1,000 mg Oral BID McLauchlin, Angela, NP   1,000 mg at 03/09/24 0753   lisinopril  (ZESTRIL ) tablet 10 mg  10 mg Oral Daily Jadapalle, Sree, MD   10 mg at 03/09/24 0753   multivitamin with minerals tablet 1 tablet  1 tablet Oral Daily Mannie Jerel PARAS, NP   1 tablet at 03/09/24 0755   nicotine  polacrilex (NICORETTE ) gum 2 mg  2 mg Oral PRN Jadapalle, Sree, MD   2 mg at 03/03/24 9166   OLANZapine  (ZYPREXA ) injection 10 mg  10 mg Intramuscular TID PRN Mannie Jerel PARAS, NP       OLANZapine  (ZYPREXA ) injection 5 mg  5 mg Intramuscular TID PRN Mannie Jerel PARAS, NP       OLANZapine  zydis (ZYPREXA ) disintegrating tablet 5 mg  5 mg Oral TID PRN Mannie Jerel PARAS, NP       polyethylene glycol (MIRALAX  / GLYCOLAX ) packet 17 g  17 g Oral Daily PRN Millington, Matthew E, PA-C   17 g at 03/07/24 0831   risperiDONE  (RISPERDAL ) tablet 2 mg  2 mg Oral QHS Jadapalle, Sree, MD   2 mg at 03/08/24 2109   thiamine  (VITAMIN B1) injection 100 mg  100 mg Intramuscular Once Mannie Jerel PARAS, NP       thiamine  (VITAMIN B1) tablet 100 mg  100 mg Oral Daily Mannie Jerel PARAS, NP   100 mg at 03/09/24 9245   traZODone  (DESYREL ) tablet 200 mg  200 mg Oral QHS Jadapalle, Sree, MD   200 mg at 03/08/24 2109   PTA Medications: Medications Prior to Admission  Medication Sig Dispense Refill Last Dose/Taking   albuterol  (VENTOLIN  HFA) 108 (90 Base) MCG/ACT inhaler Inhale 2 puffs into the lungs every 4 (four) hours as needed for wheezing or shortness of breath. 6.7 g 0 Past Week   FLUoxetine  (PROZAC ) 20 MG capsule Take 3 capsules (60 mg total) by mouth daily. (Patient taking differently: Take 20 mg by mouth daily.) 90 capsule 0 03/01/2024 Morning   levETIRAcetam  (KEPPRA ) 1000 MG tablet Take 1,000 mg by mouth 2 (two) times daily.   03/01/2024 Morning   lisinopril  (ZESTRIL ) 10 MG tablet Take 10 mg by mouth daily. for high blood pressure   03/01/2024 Morning   risperiDONE  (RISPERDAL ) 2 MG tablet Take 1 tablet (2 mg total) by mouth at bedtime. (Patient taking differently: Take 1-3 mg by mouth See admin instructions. Take 1/2 tablet by mouth every morning and 1 and 1/2 tablet at night) 30 tablet 0 03/01/2024 Morning   traZODone  (DESYREL ) 100 MG tablet Take 2 tablets (200 mg total) by mouth at bedtime. (Patient taking differently: Take 150 mg  by mouth at bedtime.) 60 tablet 0 02/29/2024    Patient Stressors: Financial difficulties   Health problems   Substance abuse    Patient Strengths: Average or above average intelligence  Capable of independent living  Communication skills   Treatment Modalities: Medication Management, Group therapy, Case management,  1 to 1 session with clinician, Psychoeducation, Recreational therapy.   Physician Treatment Plan for Primary Diagnosis: MDD (major depressive disorder), recurrent, severe, with psychosis (HCC) Long Term Goal(s): Improvement in symptoms so as ready for discharge   Short Term Goals: Ability to identify changes in lifestyle to reduce recurrence of condition will improve Ability to verbalize feelings will improve Ability to  disclose and discuss suicidal ideas Ability to demonstrate self-control will improve Ability to identify and develop effective coping behaviors will improve  Medication Management: Evaluate patient's response, side effects, and tolerance of medication regimen.  Therapeutic Interventions: 1 to 1 sessions, Unit Group sessions and Medication administration.  Evaluation of Outcomes: Progressing  Physician Treatment Plan for Secondary Diagnosis: Principal Problem:   MDD (major depressive disorder), recurrent, severe, with psychosis (HCC)  Long Term Goal(s): Improvement in symptoms so as ready for discharge   Short Term Goals: Ability to identify changes in lifestyle to reduce recurrence of condition will improve Ability to verbalize feelings will improve Ability to disclose and discuss suicidal ideas Ability to demonstrate self-control will improve Ability to identify and develop effective coping behaviors will improve     Medication Management: Evaluate patient's response, side effects, and tolerance of medication regimen.  Therapeutic Interventions: 1 to 1 sessions, Unit Group sessions and Medication administration.  Evaluation of Outcomes: Progressing   RN Treatment Plan for Primary Diagnosis: MDD (major depressive disorder), recurrent, severe, with psychosis (HCC) Long Term Goal(s): Knowledge of disease and therapeutic regimen to maintain health will improve  Short Term Goals: Ability to demonstrate self-control, Ability to participate in decision making will improve, Ability to verbalize feelings will improve, Ability to disclose and discuss suicidal ideas, Ability to identify and develop effective coping behaviors will improve, and Compliance with prescribed medications will improve  Medication Management: RN will administer medications as ordered by provider, will assess and evaluate patient's response and provide education to patient for prescribed medication. RN will report any  adverse and/or side effects to prescribing provider.  Therapeutic Interventions: 1 on 1 counseling sessions, Psychoeducation, Medication administration, Evaluate responses to treatment, Monitor vital signs and CBGs as ordered, Perform/monitor CIWA, COWS, AIMS and Fall Risk screenings as ordered, Perform wound care treatments as ordered.  Evaluation of Outcomes: Progressing   LCSW Treatment Plan for Primary Diagnosis: MDD (major depressive disorder), recurrent, severe, with psychosis (HCC) Long Term Goal(s): Safe transition to appropriate next level of care at discharge, Engage patient in therapeutic group addressing interpersonal concerns.  Short Term Goals: Engage patient in aftercare planning with referrals and resources, Increase social support, Increase ability to appropriately verbalize feelings, Increase emotional regulation, Facilitate acceptance of mental health diagnosis and concerns, and Increase skills for wellness and recovery  Therapeutic Interventions: Assess for all discharge needs, 1 to 1 time with Social worker, Explore available resources and support systems, Assess for adequacy in community support network, Educate family and significant other(s) on suicide prevention, Complete Psychosocial Assessment, Interpersonal group therapy.  Evaluation of Outcomes: Progressing   Progress in Treatment: Attending groups: Yes. Participating in groups: Yes. Taking medication as prescribed: Yes. Toleration medication: Yes. Family/Significant other contact made: No, will contact:  once permission has been granted Patient understands diagnosis: Yes.  Discussing patient identified problems/goals with staff: Yes. Medical problems stabilized or resolved: Yes. Denies suicidal/homicidal ideation: Yes. Issues/concerns per patient self-inventory: No. Other: none  New problem(s) identified: No, Describe:  none identified.  Update 03/09/2024:  No changes at this time.    New Short Term/Long  Term Goal(s): medication management for mood stabilization; elimination of SI thoughts; development of comprehensive mental wellness plan.  Update 03/09/2024:  No changes at this time.    Patient Goals:  I just need to like get my medications. To work on not trying to hurt myself.  Update 03/09/2024:  No changes at this time.    Discharge Plan or Barriers: CSW will assist pt with development of an appropriate aftercare/discharge plan. Update 03/09/2024:  Patient reports intermittent suicidal ideations.     Reason for Continuation of Hospitalization: Anxiety Depression Medication stabilization Suicidal ideation   Estimated Length of Stay: 1-7 days  Update 03/09/2024:  TBD  Last 3 [place] Suicide Severity Risk Score: Flowsheet Row Admission (Current) from 03/02/2024 in [facility] Most recent reading at 03/04/2024  2:18 PM ED from 03/01/2024 in [facility] Emergency Department at [facility] Most recent reading at 03/01/2024  7:15 PM ED from 03/01/2024 in [facility] Most recent reading at 03/01/2024  5:26 PM  C-SSRS RISK CATEGORY No Risk High Risk High Risk    Last PHQ 2/9 Scores:     No data to display          Scribe for Treatment Team: Sherryle JINNY Margo, KEN 03/09/2024 4:13 PM

## 2024-03-09 NOTE — Group Note (Signed)
 Date:  03/09/2024 Time:  9:34 PM  Group Topic/Focus:  Wellness Toolbox:   The focus of this group is to discuss various aspects of wellness, balancing those aspects and exploring ways to increase the ability to experience wellness.  Patients will create a wellness toolbox for use upon discharge.    Participation Level:  Active  Participation Quality:  Appropriate, Drowsy, Sharing, and Supportive  Affect:  Appropriate  Cognitive:  Appropriate  Insight: Appropriate  Engagement in Group:  Engaged and Supportive  Modes of Intervention:  Discussion and Socialization  Additional Comments:     Kerri Katz 03/09/2024, 9:34 PM

## 2024-03-09 NOTE — Group Note (Signed)
 [facility] LCSW Group Therapy Note   Group Date: 03/09/2024 Start Time: 1300 End Time: 1350   Type of Therapy/Topic:  Group Therapy:  Emotion Regulation  Participation Level:  Active    Description of Group:    The purpose of this group is to assist patients in learning to regulate negative emotions and experience positive emotions. Patients will be guided to discuss ways in which they have been vulnerable to their negative emotions. These vulnerabilities will be juxtaposed with experiences of positive emotions or situations, and patients challenged to use positive emotions to combat negative ones. Special emphasis will be placed on coping with negative emotions in conflict situations, and patients will process healthy conflict resolution skills.  Therapeutic Goals: Patient will identify two positive emotions or experiences to reflect on in order to balance out negative emotions:  Patient will label two or more emotions that they find the most difficult to experience:  Patient will be able to demonstrate positive conflict resolution skills through discussion or role plays:   Summary of Patient Progress: Patient was present for the entirety of the group process. During his time in the room, he contributed to the discussion and was appropriate. He appeared to have some insight into the topic and himself. Pt appeared open and receptive to feedback/comments from both his peers and the facilitator.   Therapeutic Modalities:   Cognitive Behavioral Therapy Feelings Identification Dialectical Behavioral Therapy   [name], LCSW

## 2024-03-09 NOTE — Plan of Care (Signed)

## 2024-03-09 NOTE — BHH Counselor (Signed)
 CSW reached out again today to Leo Ficht, [phone] with the [facility].   CSW left HIPAA compliant voicemail requesting return phone call.    CSW contacted the [facility] about bed availability.  CSW dialed [phone] ext [phone].  CSW was informed NO beds are available.  CSW advised to call daily after 9AM.   CSW to continue to assess.   Jamien Casanova, MSW, LCSWA 03/09/2024 2:05 PM

## 2024-03-09 NOTE — Progress Notes (Signed)
 Patient ID: Raymond [name], male   DOB: [birth year], 43 y.o.   MRN: [id number] [facility] MD Progress Note  03/09/2024 11:59 AM Raymond [name]  MRN:  [id number] 43 y/o M with a PMH of seizures, PTSD from prior military service in [place], anxiety and depression who presented to the ER after he reported ingesting 70 pills ( including keppra , trazodone  and his BP medicine) over a period of 2 days in a suicide attempt. He reports recent stressors include 3 seizure episodes, 2 panic attacks last month worsening depression and anxiety, multiple life events including his recent diagnoses of lung cancer,  the death of his mother from cancer 3 years ago and  2 miscarriages with his ex girlfriend in 2014.Patient is admitted to adult psych unit with Q15 min safety monitoring. Multidisciplinary team approach is offered. Medication management; group/milieu therapy is offered.   03/09/2024 patient seen for follow-up today they are observed in the common area and observed laughing and joking with other patients however when it is time for the interview they are disinterested they are heard telling other patients that he no one going to discharge them.  There continues to be concern for malingering.  Patient denies SI, HI and AVH.  Patient was on one-to-one yesterday due to reports that he attempted suicide in his room when questioned about it today not responding he again appears disinterested in the exam.  He denies current SI HI and AVH.  He voices concerns about being discharged indicating that he would die without his seizure so there is some forward thinking and concern for his own safety.  03/08/2024: Patient seen for follow-up.  He indicates that he attempted to hang himself in his room last night with his towel around midnight.  Discussed coping mechanisms and he expressed that he was just alone last night but he would be able to use them at home he then has a bizarre discussion about how he has a lot of pills at home and that he  lives next to a doctor who will sometimes help him with the pills.  He denies current SI, HI, and AVH.   Given the reports of attempted suicide last night we will place patient on one-to-one.   03/07/2024: hart reviewed, case discussed in multidisciplinary meeting, patient seen during rounds. Patient is reassessed on the inpatient unit. He has a bright affect, denies SI/HI/AVH. Notes stable appetite, and sleep. Notes mood lability but is then unable to elaborate. No behavioral prns overnight. They are medication compliant. They are observed in the unit milieu.   03/06/2024: Subjective:  Chart reviewed, case discussed in multidisciplinary meeting, patient seen during rounds. Patient is reassessed on the inpatient unit. He has a bright affect however states that suicidal thoughts are intermittent. Does not appear to be responding internal stimuli. There was a situation on the unit of which he was making inappropriate comments to females in Spanish and was addressed by staff. He states that I meant no harm. There has been no seizure activity. Depressive symptoms are vague at this time. Suicidal thoughts are also passive and denies plan or intent. He is sleeping and eating fine and has bright affect when engaging with peers and staff. He denies HI or AVH.   03/05/2024: Subjective:  Chart reviewed, case discussed in multidisciplinary meeting, patient seen during rounds. Patient is reassessed on the inpatient unit and explains that he continues to have depression. He has concerns regarding his home medications and medications were reviewed with patient and informed  that inpatient medications will be continued at discharge. He continues to endorses passive suicidal thoughts without any plan or intent. Sleep continues to be broken however states that this is his baseline. Patient denies homicidal ideation or AVH at this time. There has been no inappropriate behaviors, seizure activity or use of PRN medications for BH  reasons in the past 24 hours. Will continue to monitor improvement and response to treatment plan.    Sleep: Good  Appetite:  Good  Past Psychiatric History: see h&P Family History: History reviewed. No pertinent family history. Social History:  Social History   Substance and Sexual Activity  Alcohol Use Yes   Comment: 1 liter bottle of vodka prior to arrival     Social History   Substance and Sexual Activity  Drug Use Yes   Types: Methamphetamines    Social History   Socioeconomic History   Marital status: Single    Spouse name: Not on file   Number of children: Not on file   Years of education: Not on file   Highest education level: Not on file  Occupational History   Not on file  Tobacco Use   Smoking status: Every Day    Current packs/day: 1.00    Types: Cigarettes    Passive exposure: Current   Smokeless tobacco: Never  Vaping Use   Vaping status: Never Used  Substance and Sexual Activity   Alcohol use: Yes    Comment: 1 liter bottle of vodka prior to arrival   Drug use: Yes    Types: Methamphetamines   Sexual activity: Not Currently  Other Topics Concern   Not on file  Social History Narrative   Not on file   Social Drivers of Health   Financial Resource Strain: Not on File (08/18/2022)   Received from [employer]    Financial Resource Strain: 0  Food Insecurity: No Food Insecurity (03/02/2024)   Hunger Vital Sign    Worried About Running Out of Food in the Last Year: Never true    Ran Out of Food in the Last Year: Never true  Transportation Needs: No Transportation Needs (03/02/2024)   PRAPARE - Administrator, Civil Service (Medical): No    Lack of Transportation (Non-Medical): No  Physical Activity: Not on File (08/18/2022)   Received from [facility]   Physical Activity    Physical Activity: 0  Stress: Not on File (08/18/2022)   Received from [facility]   Stress    Stress: 0  Social Connections: Not on File  (02/15/2023)   Received from [employer]   Social Connections    Connectedness: 0   Past Medical History:  Past Medical History:  Diagnosis Date   Alcohol abuse    Anxiety    Hepatitis C    PTSD (post-traumatic stress disorder)     Past Surgical History:  Procedure Laterality Date   FACIAL FRACTURE SURGERY     metal plate under right eye    Current Medications: Current Facility-Administered Medications  Medication Dose Route Frequency Provider Last Rate Last Admin   albuterol  (VENTOLIN  HFA) 108 (90 Base) MCG/ACT inhaler 2 puff  2 puff Inhalation Q4H PRN Jadapalle, Sree, MD   2 puff at 03/09/24 0757   alum & mag hydroxide-simeth (MAALOX/MYLANTA) 200-200-20 MG/5ML suspension 30 mL  30 mL Oral Q4H PRN Mannie Jerel PARAS, NP       FLUoxetine  (PROZAC ) capsule 60 mg  60 mg Oral Daily Jadapalle, Sree, MD  60 mg at 03/09/24 0753   levETIRAcetam  (KEPPRA ) tablet 1,000 mg  1,000 mg Oral BID McLauchlin, Angela, NP   1,000 mg at 03/09/24 0753   lisinopril  (ZESTRIL ) tablet 10 mg  10 mg Oral Daily Jadapalle, Sree, MD   10 mg at 03/09/24 0753   multivitamin with minerals tablet 1 tablet  1 tablet Oral Daily Mannie Jerel PARAS, NP   1 tablet at 03/09/24 0755   nicotine  polacrilex (NICORETTE ) gum 2 mg  2 mg Oral PRN Jadapalle, Sree, MD   2 mg at 03/03/24 9166   OLANZapine  (ZYPREXA ) injection 10 mg  10 mg Intramuscular TID PRN Mannie Jerel PARAS, NP       OLANZapine  (ZYPREXA ) injection 5 mg  5 mg Intramuscular TID PRN Mannie Jerel PARAS, NP       OLANZapine  zydis (ZYPREXA ) disintegrating tablet 5 mg  5 mg Oral TID PRN Mannie Jerel PARAS, NP       polyethylene glycol (MIRALAX  / GLYCOLAX ) packet 17 g  17 g Oral Daily PRN Daryl Quiros E, PA-C   17 g at 03/07/24 0831   risperiDONE  (RISPERDAL ) tablet 2 mg  2 mg Oral QHS Jadapalle, Sree, MD   2 mg at 03/08/24 2109   thiamine  (VITAMIN B1) injection 100 mg  100 mg Intramuscular Once Mannie Jerel PARAS, NP       thiamine  (VITAMIN B1) tablet 100 mg  100 mg Oral Daily  Mannie Jerel PARAS, NP   100 mg at 03/09/24 0754   traZODone  (DESYREL ) tablet 200 mg  200 mg Oral QHS Jadapalle, Sree, MD   200 mg at 03/08/24 2109    Lab Results: No results found for this or any previous visit (from the past 48 hours).  Blood Alcohol level:  Lab Results  Component Value Date   ETH 243 (H) 03/01/2024   ETH 28 (H) 09/25/2023    Metabolic Disorder Labs: Lab Results  Component Value Date   HGBA1C 5.3 08/11/2023   MPG 105.41 08/11/2023   MPG 108.28 03/28/2023   Lab Results  Component Value Date   PROLACTIN 38.0 (H) 09/06/2023   PROLACTIN 12.0 01/22/2023   Lab Results  Component Value Date   CHOL 172 08/11/2023   TRIG 146 08/11/2023   HDL 53 08/11/2023   CHOLHDL 3.2 08/11/2023   VLDL 29 08/11/2023   LDLCALC 90 08/11/2023   LDLCALC 84 03/28/2023    Physical Findings: AIMS:  , ,  ,  ,    CIWA:  CIWA-Ar Total: 0 COWS:      Psychiatric Specialty Exam:  Presentation  General Appearance:  Appropriate for Environment  Eye Contact: Good  Speech: Normal Rate  Speech Volume: Normal    Mood and Affect  Mood: Euthymic  Affect: Appropriate   Thought Process  Thought Processes: Coherent  Descriptions of Associations:Intact  Orientation:Full (Time, Place and Person)  Thought Content:WDL  Hallucinations:No data recorded  Ideas of Reference:None  Suicidal Thoughts:No data recorded  Homicidal Thoughts:No data recorded   Sensorium  Memory: Immediate Good; Recent Good; Remote Poor  Judgment: Fair  Insight: Fair   Art therapist  Concentration: Good  Attention Span: Good  Recall: Good  Fund of Knowledge: Good  Language: Good   Psychomotor Activity  Psychomotor Activity: No data recorded  Musculoskeletal: Strength & Muscle Tone: within normal limits Gait & Station: normal Assets  Assets: Communication Skills    Physical Exam: Physical Exam Vitals and nursing note reviewed.  HENT:     Head:  Atraumatic.  Eyes:     Extraocular Movements: Extraocular movements intact.  Pulmonary:     Effort: Pulmonary effort is normal.  Neurological:     Mental Status: He is alert and oriented to person, place, and time.    Review of Systems  Psychiatric/Behavioral:  Positive for depression. Negative for hallucinations and suicidal ideas. The patient is nervous/anxious.    Blood pressure 115/82, pulse 65, temperature 97.6 F (36.4 C), resp. rate 12, height 6' 1 (1.854 m), weight 72.1 kg, SpO2 100%. Body mass index is 20.98 kg/m.  Diagnosis: Principal Problem:   MDD (major depressive disorder), recurrent, severe, with psychosis (HCC)   PLAN: Safety and Monitoring:  -- Voluntary admission to inpatient psychiatric unit for safety, stabilization and treatment  -- Daily contact with patient to assess and evaluate symptoms and progress in treatment  -- Patient's case to be discussed in multi-disciplinary team meeting  -- Observation Level : q15 minute checks  -- Vital signs:  q12 hours  -- Precautions: suicide, elopement, and assault -- Encouraged patient to participate in unit milieu and in scheduled group therapies  2. Psychiatric Diagnoses and Treatment: Will refer patient to [facility] facility.  Will stepdown from the one-to-one today patient will be on daytime room lockout.  We continue to have significant concern for malingering however given the presentation and lack of a safe disposition we will continue to monitor and adjust medications as the patient is willing is not agreeable to medication adjustment today.  Continue fluoxetine  60 mg daily Risperidal 2 mg at bedtime  Trazodone  200 mg at bedtime      3. Medical Issues Being Addressed:   None Albuterol  as needed Keppra  1000 mg twice daily lisinopril  10 mg daily multivitamin daily thiamine  100 mg daily 4. Discharge Planning:   -- Social work and case management to assist with discharge planning and identification of hospital  follow-up needs prior to discharge  -- Estimated LOS: 3-4 days  Donnice FORBES Right, PA-C 03/09/2024, 11:59 AM

## 2024-03-09 NOTE — BHH Counselor (Signed)
 CSW spoke with the patient's [facility] Case Manager Vila FALCON.  [phone].   He reports that pt is fully VA connected at this time.  He reports that patient has PSI for ACTT.  He reports a belief that patient was triggered by not having his check in September.  He reports that DSS is the patient's payee.   Sherryle Margo, MSW, LCSW 03/09/2024 4:13 PM

## 2024-03-09 NOTE — Group Note (Unsigned)
 Date:  03/09/2024 Time:  9:00 PM  Group Topic/Focus:  Wellness Toolbox:   The focus of this group is to discuss various aspects of wellness, balancing those aspects and exploring ways to increase the ability to experience wellness.  Patients will create a wellness toolbox for use upon discharge.     Participation Level:  {BHH PARTICIPATION OZCZO:77735}  Participation Quality:  {BHH PARTICIPATION QUALITY:22265}  Affect:  {BHH AFFECT:22266}  Cognitive:  {BHH COGNITIVE:22267}  Insight: {BHH Insight2:20797}  Engagement in Group:  {BHH ENGAGEMENT IN HMNLE:77731}  Modes of Intervention:  {BHH MODES OF INTERVENTION:22269}  Additional Comments:  ***  Kerri Katz 03/09/2024, 9:00 PM

## 2024-03-09 NOTE — Progress Notes (Signed)
 8am: Patient cooperative this morning stating he slept well. Patient denies SI,HI, and A/V/H with no plan or intent.  1:1 discontinued at 10:34am. Patient verbalized coming to staff if any suicidal thoughts arise and denies any current intent. Patient's room changed closer to nurses station. Room lockout then initiated per provider order. Patient verbalized all understanding and remains cooperative.  1400: Patient visible in milieu, attending groups and socializing appropriately. Patient remains cooperative with room lockout order.  1800: Patient compliant with scheduled medications and remains cooperative in unit.

## 2024-03-10 DIAGNOSIS — F332 Major depressive disorder, recurrent severe without psychotic features: Secondary | ICD-10-CM

## 2024-03-10 DIAGNOSIS — F333 Major depressive disorder, recurrent, severe with psychotic symptoms: Secondary | ICD-10-CM | POA: Diagnosis not present

## 2024-03-10 NOTE — Progress Notes (Signed)
 Pt visible and interacting with peers and staff within the milieu.  Participated in wrap up group.  Continues to endorse depression rated 7/10 and noted mother's death 3 years ago as the trigger.  He denied SI/HI, AVH.  Pt took all scheduled HS medications.    03/09/24 2023  Psych Admission Type (Psych Patients Only)  Admission Status Voluntary  Psychosocial Assessment  Patient Complaints None  Eye Contact Fair  Facial Expression Animated  Affect Appropriate to circumstance  Speech Logical/coherent  Interaction Assertive  Motor Activity Other (Comment) (WDL)  Appearance/Hygiene Unremarkable  Behavior Characteristics Cooperative;Appropriate to situation  Mood Depressed  Thought Process  Coherency WDL  Content WDL  Delusions None reported or observed  Perception WDL  Hallucination None reported or observed  Judgment Limited  Confusion None  Danger to Self  Current suicidal ideation? Denies  Agreement Not to Harm Self Yes  Description of Agreement Verbal  Danger to Others  Danger to Others None reported or observed  Danger to Others Abnormal  Harmful Behavior to others No threats or harm toward other people  Destructive Behavior No threats or harm toward property

## 2024-03-10 NOTE — Group Note (Signed)
 Recreation Therapy Group Note   Group Topic:General Recreation  Group Date: 03/10/2024 Start Time: 1000 End Time: 1100 Facilitators: Celestia Jeoffrey BRAVO, LRT, CTRS Location: Courtyard  Group Description: [employer]. LRT and patients played games of basketball, drew with chalk, and played corn hole while outside in the courtyard while getting fresh air and sunlight. Music was being played in the background. LRT and peers conversed about different games they have played before, what they do in their free time and anything else that is on their minds. LRT encouraged pts to drink water after being outside, sweating and getting their heart rate up.  Goal Area(s) Addressed: Patient will build on frustration tolerance skills. Patients will partake in a competitive play game with peers. Patients will gain knowledge of new leisure interest/hobby.     Affect/Mood: Elevated   Participation Level: Active   Participation Quality: Independent   Behavior: Alert   Speech/Thought Process: Coherent   Insight: Fair   Judgement: Fair    Modes of Intervention: Activity   Patient Response to Interventions:  Receptive   Education Outcome:  Acknowledges education   Clinical Observations/Individualized Feedback: Raymond [name] was active in their participation of session activities and group discussion. Pt interacted well with LRT and peers duration of session.    Plan: Continue to engage patient in RT group sessions 2-3x/week.   Jeoffrey BRAVO Celestia, LRT, CTRS 03/10/2024 11:18 AM

## 2024-03-10 NOTE — BHH Group Notes (Signed)
 Spirituality Group   Description: Participant directed exploration of values, beliefs and meaning  **Focus on: self compassion/authentic self, acceptance, here and now, coping with pain  Following a brief framework of chaplain's role and ground rules of group behavior, participants are invited to share concerns or questions that engage spiritual life. Emphasis placed on common themes and shared experiences and ways to make meaning and clarify living into one's values.   Theory/Process/Goal: Utilize the theoretical framework of group therapy established by Celena Kite, Relational Cultural Theory and Rogerian approaches to facilitate relational empathy and use of the "here and now" to foster reflection, self-awareness, and sharing.   Observations: Raymond [name] was an active participant in the group discussion.  [name] HERO.Div

## 2024-03-10 NOTE — Plan of Care (Signed)
   Problem: Education: Goal: Knowledge of Raymond [name] General Education information/materials will improve Outcome: Progressing Goal: Emotional status will improve Outcome: Progressing Goal: Mental status will improve Outcome: Progressing Goal: Verbalization of understanding the information provided will improve Outcome: Progressing   Problem: Activity: Goal: Interest or engagement in activities will improve Outcome: Progressing Goal: Sleeping patterns will improve Outcome: Progressing   Problem: Coping: Goal: Ability to verbalize frustrations and anger appropriately will improve Outcome: Progressing Goal: Ability to demonstrate self-control will improve Outcome: Progressing

## 2024-03-10 NOTE — Group Note (Signed)
 Date:  03/10/2024 Time:  10:19 PM  Group Topic/Focus:  Coping With Mental Health Crisis:   The purpose of this group is to help patients identify strategies for coping with mental health crisis.  Group discusses possible causes of crisis and ways to manage them effectively.    Participation Level:  Active  Participation Quality:  Appropriate  Affect:  Appropriate  Cognitive:  Alert  Insight: Appropriate  Engagement in Group:  Engaged  Modes of Intervention:  Discussion  Additional Comments:    Nox Talent L 03/10/2024, 10:19 PM

## 2024-03-10 NOTE — Group Note (Signed)
 Recreation Therapy Group Note   Group Topic:Animal Assisted Therapy   Group Date: 03/10/2024 Start Time: 1400 End Time: 1430 Facilitators: Celestia Jeoffrey BRAVO, LRT, CTRS Location: Craft Room  Group Description: AAA. Animal-Assisted Activity provides opportunities for motivational, educational, therapeutic and/or recreational benefits to enhance quality of life. Selinda and Rollo visited the unit to interact with patients.    Goal Areas Addressed:  Reduced anxiety and stress Improved mood Increased social interaction Enhanced communication skills Reduced loneliness and isolation Improved emotional regulation   Affect/Mood: Appropriate   Participation Level: Active and Engaged   Participation Quality: Independent   Behavior: Appropriate, Calm, and Cooperative   Speech/Thought Process: Coherent   Insight: Good   Judgement: Good   Modes of Intervention: Activity   Patient Response to Interventions:  Attentive, Engaged, and Receptive   Education Outcome:  Acknowledges education   Clinical Observations/Individualized Feedback: Hagen was active in their participation of session activities and group discussion.    Plan: Continue to engage patient in RT group sessions 2-3x/week.   Jeoffrey BRAVO Celestia, LRT, CTRS 03/10/2024 5:09 PM

## 2024-03-10 NOTE — Progress Notes (Signed)
   03/10/24 0900  Psych Admission Type (Psych Patients Only)  Admission Status Voluntary  Psychosocial Assessment  Patient Complaints Depression;Sadness (patient states he's thinking about his Mom, who passed away three years ago; as well as the two babies he lost by miscarriage)  Eye Contact Fair  Facial Expression Animated  Affect Appropriate to circumstance  Speech Logical/coherent  Interaction Assertive  Motor Activity Pacing;Slow  Appearance/Hygiene In scrubs;Layered clothes;Poor hygiene  Behavior Characteristics Cooperative;Appropriate to situation  Mood Sad;Pleasant  Aggressive Behavior  Effect No apparent injury  Thought Process  Coherency WDL  Content WDL  Delusions None reported or observed  Perception WDL  Hallucination None reported or observed  Judgment WDL  Confusion None  Danger to Self  Current suicidal ideation? Denies  Agreement Not to Harm Self Yes  Description of Agreement Verbal  Danger to Others  Danger to Others None reported or observed  Danger to Others Abnormal  Harmful Behavior to others No threats or harm toward other people  Destructive Behavior No threats or harm toward property   Patient's goal for today, per his self-inventory is his health.

## 2024-03-10 NOTE — Plan of Care (Signed)

## 2024-03-10 NOTE — Progress Notes (Signed)
 Patient ID: Raymond [name], male   DOB: November 19, 1980, 43 y.o.   MRN: [id number] [facility] MD Progress Note  03/10/2024 44:37 PM [name]  MRN:  [id number] 43 y/o M with a PMH of seizures, PTSD from prior military service in [place], anxiety and depression who presented to the ER after he reported ingesting 70 pills ( including keppra , trazodone  and his BP medicine) over a period of 2 days in a suicide attempt. He reports recent stressors include 3 seizure episodes, 2 panic attacks last month worsening depression and anxiety, multiple life events including his recent diagnoses of lung cancer,  the death of his mother from cancer 3 years ago and  2 miscarriages with his ex girlfriend in 2014.Patient is admitted to adult psych unit with Q15 min safety monitoring. Multidisciplinary team approach is offered. Medication management; group/milieu therapy is offered.   03/10/2024: Patient seen today for follow-up.  They are found in the day room.  They are alert and oriented.  They are linear logical and future oriented.  They deny SI, HI, and AVH.  There were no behavioral concerns overnight.  They continue to be on daytime lockout will step down to normal precautions if they continue to do well.  They have asked services through the [place] they are unsure who their provider is.  They also have case management.  They have stable housing.  They are able to discuss the crisis resources.  There continues to be concern for malingering.  03/09/2024 patient seen for follow-up today they are observed in the common area and observed laughing and joking with other patients however when it is time for the interview they are disinterested they are heard telling other patients that he no one going to discharge them.  There continues to be concern for malingering.  Patient denies SI, HI and AVH.  Patient was on one-to-one yesterday due to reports that he attempted suicide in his room when questioned about it today not responding he again appears  disinterested in the exam.  He denies current SI HI and AVH.  He voices concerns about being discharged indicating that he would die without his seizure so there is some forward thinking and concern for his own safety.  03/08/2024: Patient seen for follow-up.  He indicates that he attempted to hang himself in his room last night with his towel around midnight.  Discussed coping mechanisms and he expressed that he was just alone last night but he would be able to use them at home he then has a bizarre discussion about how he has a lot of pills at home and that he lives next to a doctor who will sometimes help him with the pills.  He denies current SI, HI, and AVH.   Given the reports of attempted suicide last night we will place patient on one-to-one.   03/07/2024: hart reviewed, case discussed in multidisciplinary meeting, patient seen during rounds. Patient is reassessed on the inpatient unit. He has a bright affect, denies SI/HI/AVH. Notes stable appetite, and sleep. Notes mood lability but is then unable to elaborate. No behavioral prns overnight. They are medication compliant. They are observed in the unit milieu.   03/06/2024: Subjective:  Chart reviewed, case discussed in multidisciplinary meeting, patient seen during rounds. Patient is reassessed on the inpatient unit. He has a bright affect however states that suicidal thoughts are intermittent. Does not appear to be responding internal stimuli. There was a situation on the unit of which he was making inappropriate comments  to females in Spanish and was addressed by staff. He states that I meant no harm. There has been no seizure activity. Depressive symptoms are vague at this time. Suicidal thoughts are also passive and denies plan or intent. He is sleeping and eating fine and has bright affect when engaging with peers and staff. He denies HI or AVH.   03/05/2024: Subjective:  Chart reviewed, case discussed in multidisciplinary meeting, patient seen  during rounds. Patient is reassessed on the inpatient unit and explains that he continues to have depression. He has concerns regarding his home medications and medications were reviewed with patient and informed that inpatient medications will be continued at discharge. He continues to endorses passive suicidal thoughts without any plan or intent. Sleep continues to be broken however states that this is his baseline. Patient denies homicidal ideation or AVH at this time. There has been no inappropriate behaviors, seizure activity or use of PRN medications for BH reasons in the past 24 hours. Will continue to monitor improvement and response to treatment plan.    Sleep: Good  Appetite:  Good  Past Psychiatric History: see h&P Family History: History reviewed. No pertinent family history. Social History:  Social History   Substance and Sexual Activity  Alcohol Use Yes   Comment: 1 liter bottle of vodka prior to arrival     Social History   Substance and Sexual Activity  Drug Use Yes   Types: Methamphetamines    Social History   Socioeconomic History   Marital status: Single    Spouse name: Not on file   Number of children: Not on file   Years of education: Not on file   Highest education level: Not on file  Occupational History   Not on file  Tobacco Use   Smoking status: Every Day    Current packs/day: 1.00    Types: Cigarettes    Passive exposure: Current   Smokeless tobacco: Never  Vaping Use   Vaping status: Never Used  Substance and Sexual Activity   Alcohol use: Yes    Comment: 1 liter bottle of vodka prior to arrival   Drug use: Yes    Types: Methamphetamines   Sexual activity: Not Currently  Other Topics Concern   Not on file  Social History Narrative   Not on file   Social Drivers of Health   Financial Resource Strain: Not on File (08/18/2022)   Received from [employer]    Financial Resource Strain: 0  Food Insecurity: No Food  Insecurity (03/02/2024)   Hunger Vital Sign    Worried About Running Out of Food in the Last Year: Never true    Ran Out of Food in the Last Year: Never true  Transportation Needs: No Transportation Needs (03/02/2024)   PRAPARE - Administrator, Civil Service (Medical): No    Lack of Transportation (Non-Medical): No  Physical Activity: Not on File (08/18/2022)   Received from [facility]   Physical Activity    Physical Activity: 0  Stress: Not on File (08/18/2022)   Received from [facility]   Stress    Stress: 0  Social Connections: Not on File (02/15/2023)   Received from [facility]   Social Connections    Connectedness: 0   Past Medical History:  Past Medical History:  Diagnosis Date   Alcohol abuse    Anxiety    Hepatitis C    PTSD (post-traumatic stress disorder)     Past Surgical History:  Procedure Laterality Date   FACIAL FRACTURE SURGERY     metal plate under right eye    Current Medications: Current Facility-Administered Medications  Medication Dose Route Frequency Provider Last Rate Last Admin   albuterol  (VENTOLIN  HFA) 108 (90 Base) MCG/ACT inhaler 2 puff  2 puff Inhalation Q4H PRN Jadapalle, Sree, MD   2 puff at 03/10/24 0839   alum & mag hydroxide-simeth (MAALOX/MYLANTA) 200-200-20 MG/5ML suspension 30 mL  30 mL Oral Q4H PRN Mannie Jerel PARAS, NP       FLUoxetine  (PROZAC ) capsule 60 mg  60 mg Oral Daily Jadapalle, Sree, MD   60 mg at 03/10/24 9166   levETIRAcetam  (KEPPRA ) tablet 1,000 mg  1,000 mg Oral BID McLauchlin, Angela, NP   1,000 mg at 03/10/24 9166   lisinopril  (ZESTRIL ) tablet 10 mg  10 mg Oral Daily Jadapalle, Sree, MD   10 mg at 03/10/24 9166   multivitamin with minerals tablet 1 tablet  1 tablet Oral Daily Mannie Jerel PARAS, NP   1 tablet at 03/10/24 0833   nicotine  polacrilex (NICORETTE ) gum 2 mg  2 mg Oral PRN Jadapalle, Sree, MD   2 mg at 03/10/24 9163   OLANZapine  (ZYPREXA ) injection 10 mg  10 mg Intramuscular TID PRN Mannie Jerel PARAS, NP        OLANZapine  (ZYPREXA ) injection 5 mg  5 mg Intramuscular TID PRN Mannie Jerel PARAS, NP       OLANZapine  zydis (ZYPREXA ) disintegrating tablet 5 mg  5 mg Oral TID PRN Mannie Jerel PARAS, NP       polyethylene glycol (MIRALAX  / GLYCOLAX ) packet 17 g  17 g Oral Daily PRN Shubh Chiara E, PA-C   17 g at 03/10/24 9163   risperiDONE  (RISPERDAL ) tablet 2 mg  2 mg Oral QHS Jadapalle, Sree, MD   2 mg at 03/09/24 2101   thiamine  (VITAMIN B1) injection 100 mg  100 mg Intramuscular Once Mannie Jerel PARAS, NP       thiamine  (VITAMIN B1) tablet 100 mg  100 mg Oral Daily Mannie Jerel PARAS, NP   100 mg at 03/10/24 9166   traZODone  (DESYREL ) tablet 200 mg  200 mg Oral QHS Jadapalle, Sree, MD   200 mg at 03/09/24 2101    Lab Results: No results found for this or any previous visit (from the past 48 hours).  Blood Alcohol level:  Lab Results  Component Value Date   ETH 243 (H) 03/01/2024   ETH 28 (H) 09/25/2023    Metabolic Disorder Labs: Lab Results  Component Value Date   HGBA1C 5.3 08/11/2023   MPG 105.41 08/11/2023   MPG 108.28 03/28/2023   Lab Results  Component Value Date   PROLACTIN 38.0 (H) 09/06/2023   PROLACTIN 12.0 01/22/2023   Lab Results  Component Value Date   CHOL 172 08/11/2023   TRIG 146 08/11/2023   HDL 53 08/11/2023   CHOLHDL 3.2 08/11/2023   VLDL 29 08/11/2023   LDLCALC 90 08/11/2023   LDLCALC 84 03/28/2023    Physical Findings: AIMS:  , ,  ,  ,    CIWA:  CIWA-Ar Total: 0 COWS:      Psychiatric Specialty Exam:  Presentation  General Appearance:  Appropriate for Environment  Eye Contact: Good  Speech: Normal Rate  Speech Volume: Normal    Mood and Affect  Mood: Euthymic  Affect: Appropriate   Thought Process  Thought Processes: Coherent  Descriptions of Associations:Intact  Orientation:Full (Time, Place and Person)  Thought Content:WDL  Hallucinations:No data recorded  Ideas of Reference:None  Suicidal Thoughts:No data  recorded  Homicidal Thoughts:No data recorded   Sensorium  Memory: Immediate Good; Recent Good; Remote Poor  Judgment: Fair  Insight: Fair   Art therapist  Concentration: Good  Attention Span: Good  Recall: Good  Fund of Knowledge: Good  Language: Good   Psychomotor Activity  Psychomotor Activity: No data recorded  Musculoskeletal: Strength & Muscle Tone: within normal limits Gait & Station: normal Assets  Assets: Communication Skills    Physical Exam: Physical Exam Vitals and nursing note reviewed.  HENT:     Head: Atraumatic.  Eyes:     Extraocular Movements: Extraocular movements intact.  Pulmonary:     Effort: Pulmonary effort is normal.  Neurological:     Mental Status: He is alert and oriented to person, place, and time.    Review of Systems  Psychiatric/Behavioral:  Positive for depression. Negative for hallucinations and suicidal ideas. The patient is nervous/anxious.    Blood pressure 124/80, pulse 73, temperature (!) 97.3 F (36.3 C), resp. rate 20, height 6' 1 (1.854 m), weight 72.1 kg, SpO2 97%. Body mass index is 20.98 kg/m.  Diagnosis: Principal Problem:   MDD (major depressive disorder), recurrent, severe, with psychosis (HCC)   PLAN: Safety and Monitoring:  -- Voluntary admission to inpatient psychiatric unit for safety, stabilization and treatment  -- Daily contact with patient to assess and evaluate symptoms and progress in treatment  -- Patient's case to be discussed in multi-disciplinary team meeting  -- Observation Level : q15 minute checks  -- Vital signs:  q12 hours  -- Precautions: suicide, elopement, and assault -- Encouraged patient to participate in unit milieu and in scheduled group therapies  2. Psychiatric Diagnoses and Treatment: Will refer patient to [facility] facility.  2 days ago patient indicated that he attempted suicide in his room he was placed on one-to-one precautions and then stepdown to room  lockout in common area access we are continuing this for today.  On interview he denies depression he denies anxiety.  He denies SI HI and AVH.  No medication adjustments at this time.  Continue fluoxetine  60 mg daily Risperidal 2 mg at bedtime  Trazodone  200 mg at bedtime      3. Medical Issues Being Addressed:   None Albuterol  as needed Keppra  1000 mg twice daily lisinopril  10 mg daily multivitamin daily thiamine  100 mg daily 4. Discharge Planning:   -- Social work and case management to assist with discharge planning and identification of hospital follow-up needs prior to discharge  -- Estimated LOS: 3-4 days  Donnice FORBES Right, PA-C 03/10/2024, 12:37 PM

## 2024-03-10 NOTE — BHH Counselor (Signed)
 CSW attempted to touch base with patient's ACTT team to engage in safe discharge planning.   CSW left HIPAA compliant voicemail.   CSW to continue to assess.   Kartik Fernando, MSW, LCSWA 03/10/2024 3:14 PM

## 2024-03-10 NOTE — Group Note (Signed)
 [facility] LCSW Group Therapy Note   Group Date: 03/10/2024 Start Time: 1300 End Time: 1400   Type of Therapy/Topic:  Group Therapy:  Emotion Regulation  Participation Level:  Active   Mood: Appropriate   Description of Group:    The purpose of this group is to assist patients in learning to regulate negative emotions and experience positive emotions. Patients will be guided to discuss ways in which they have been vulnerable to their negative emotions. These vulnerabilities will be juxtaposed with experiences of positive emotions or situations, and patients challenged to use positive emotions to combat negative ones. Special emphasis will be placed on coping with negative emotions in conflict situations, and patients will process healthy conflict resolution skills.  Therapeutic Goals: Patient will identify two positive emotions or experiences to reflect on in order to balance out negative emotions:  Patient will label two or more emotions that they find the most difficult to experience:  Patient will be able to demonstrate positive conflict resolution skills through discussion or role plays:   Summary of Patient Progress:   During group, patient and group explored the ways in which our thoughts can impact our feelings which impacts our behaviors. The patient actively engaged in the group session and shared personal strategies for regulating a range of emotions. The group, along with the facilitator, explored various situations that triggered both safe and unsafe feelings, and discussed effective methods for emotional regulation to support improved outcomes. The patient was open, supportive of peers, and receptive to feedback, contributing positively to the group dynamic.      Therapeutic Modalities:   Cognitive Behavioral Therapy Feelings Identification Dialectical Behavioral Therapy   Alveta CHRISTELLA Kerns, LCSW

## 2024-03-10 NOTE — Progress Notes (Signed)
Patient calm and pleasant during assessment denying SI/HI/AVH. Pt observed by this Probation officer interacting appropriately with staff and peers on the unit. Pt compliant with medication administration per MD orders. Pt given education, support, and encouragement to be active in his treatment plan. Pt being monitored Q 15 minutes for safety per unit protocol, remains safe on the unit

## 2024-03-11 DIAGNOSIS — F333 Major depressive disorder, recurrent, severe with psychotic symptoms: Secondary | ICD-10-CM | POA: Diagnosis not present

## 2024-03-11 NOTE — Progress Notes (Signed)
 Patient ID: Raymond [name], male   DOB: Jan 31, 1981, 43 y.o.   MRN: [id number] [facility] MD Progress Note  03/11/2024 4:48 PM Raymond [name]  MRN:  [id number] 43 y/o M with a PMH of seizures, PTSD from prior military service in [place], anxiety and depression who presented to the ER after he reported ingesting 70 pills ( including keppra , trazodone  and his BP medicine) over a period of 2 days in a suicide attempt. He reports recent stressors include 3 seizure episodes, 2 panic attacks last month worsening depression and anxiety, multiple life events including his recent diagnoses of lung cancer,  the death of his mother from cancer 3 years ago and  2 miscarriages with his ex girlfriend in 2014.Patient is admitted to adult psych unit with Q15 min safety monitoring. Multidisciplinary team approach is offered. Medication management; group/milieu therapy is offered.   03/11/24: Seen for follow-up today they are alert and oriented.  They are cooperative they deny SI, HI, and AVH.  No behavioral concerns overnight.  Will stepdown from daytime lockout.  Will return to normal diet.  Given reported attempt earlier in the week we will continue to monitor for safety.  Discussed likely for discharge early next week.  We are attempting to get in contact with the ACT team so that they may come and evaluate the patient.  No medication adjustments at this time.  03/10/2024: Patient seen today for follow-up.  They are found in the day room.  They are alert and oriented.  They are linear logical and future oriented.  They deny SI, HI, and AVH.  There were no behavioral concerns overnight.  They continue to be on daytime lockout will step down to normal precautions if they continue to do well.  They have asked services through the [facility] they are unsure who their provider is.  They also have case management.  They have stable housing.  They are able to discuss the crisis resources.  There continues to be concern for malingering.  03/09/2024  patient seen for follow-up today they are observed in the common area and observed laughing and joking with other patients however when it is time for the interview they are disinterested they are heard telling other patients that he no one going to discharge them.  There continues to be concern for malingering.  Patient denies SI, HI and AVH.  Patient was on one-to-one yesterday due to reports that he attempted suicide in his room when questioned about it today not responding he again appears disinterested in the exam.  He denies current SI HI and AVH.  He voices concerns about being discharged indicating that he would die without his seizure so there is some forward thinking and concern for his own safety.  03/08/2024: Patient seen for follow-up.  He indicates that he attempted to hang himself in his room last night with his towel around midnight.  Discussed coping mechanisms and he expressed that he was just alone last night but he would be able to use them at home he then has a bizarre discussion about how he has a lot of pills at home and that he lives next to a doctor who will sometimes help him with the pills.  He denies current SI, HI, and AVH.   Given the reports of attempted suicide last night we will place patient on one-to-one.   03/07/2024: hart reviewed, case discussed in multidisciplinary meeting, patient seen during rounds. Patient is reassessed on the inpatient unit. He has a bright  affect, denies SI/HI/AVH. Notes stable appetite, and sleep. Notes mood lability but is then unable to elaborate. No behavioral prns overnight. They are medication compliant. They are observed in the unit milieu.   03/06/2024: Subjective:  Chart reviewed, case discussed in multidisciplinary meeting, patient seen during rounds. Patient is reassessed on the inpatient unit. He has a bright affect however states that suicidal thoughts are intermittent. Does not appear to be responding internal stimuli. There was a  situation on the unit of which he was making inappropriate comments to females in Spanish and was addressed by staff. He states that I meant no harm. There has been no seizure activity. Depressive symptoms are vague at this time. Suicidal thoughts are also passive and denies plan or intent. He is sleeping and eating fine and has bright affect when engaging with peers and staff. He denies HI or AVH.   03/05/2024: Subjective:  Chart reviewed, case discussed in multidisciplinary meeting, patient seen during rounds. Patient is reassessed on the inpatient unit and explains that he continues to have depression. He has concerns regarding his home medications and medications were reviewed with patient and informed that inpatient medications will be continued at discharge. He continues to endorses passive suicidal thoughts without any plan or intent. Sleep continues to be broken however states that this is his baseline. Patient denies homicidal ideation or AVH at this time. There has been no inappropriate behaviors, seizure activity or use of PRN medications for BH reasons in the past 24 hours. Will continue to monitor improvement and response to treatment plan.    Sleep: Good  Appetite:  Good  Past Psychiatric History: see h&P Family History: History reviewed. No pertinent family history. Social History:  Social History   Substance and Sexual Activity  Alcohol Use Yes   Comment: 1 liter bottle of vodka prior to arrival     Social History   Substance and Sexual Activity  Drug Use Yes   Types: Methamphetamines    Social History   Socioeconomic History   Marital status: Single    Spouse name: Not on file   Number of children: Not on file   Years of education: Not on file   Highest education level: Not on file  Occupational History   Not on file  Tobacco Use   Smoking status: Every Day    Current packs/day: 1.00    Types: Cigarettes    Passive exposure: Current   Smokeless tobacco: Never   Vaping Use   Vaping status: Never Used  Substance and Sexual Activity   Alcohol use: Yes    Comment: 1 liter bottle of vodka prior to arrival   Drug use: Yes    Types: Methamphetamines   Sexual activity: Not Currently  Other Topics Concern   Not on file  Social History Narrative   Not on file   Social Drivers of Health   Financial Resource Strain: Not on File (08/18/2022)   Received from [employer] [facility]    Financial Resource Strain: 0  Food Insecurity: No Food Insecurity (03/02/2024)   Hunger Vital Sign    Worried About Running Out of Food in the Last Year: Never true    Ran Out of Food in the Last Year: Never true  Transportation Needs: No Transportation Needs (03/02/2024)   PRAPARE - Administrator, Civil Service (Medical): No    Lack of Transportation (Non-Medical): No  Physical Activity: Not on File (08/18/2022)   Received from [facility]  Physical Activity    Physical Activity: 0  Stress: Not on File (08/18/2022)   Received from [facility]   Stress    Stress: 0  Social Connections: Not on File (02/15/2023)   Received from [facility]   Social Connections    Connectedness: 0   Past Medical History:  Past Medical History:  Diagnosis Date   Alcohol abuse    Anxiety    Hepatitis C    PTSD (post-traumatic stress disorder)     Past Surgical History:  Procedure Laterality Date   FACIAL FRACTURE SURGERY     metal plate under right eye    Current Medications: Current Facility-Administered Medications  Medication Dose Route Frequency Provider Last Rate Last Admin   albuterol  (VENTOLIN  HFA) 108 (90 Base) MCG/ACT inhaler 2 puff  2 puff Inhalation Q4H PRN Jadapalle, Sree, MD   2 puff at 03/10/24 0839   alum & mag hydroxide-simeth (MAALOX/MYLANTA) 200-200-20 MG/5ML suspension 30 mL  30 mL Oral Q4H PRN Mannie Jerel PARAS, NP       FLUoxetine  (PROZAC ) capsule 60 mg  60 mg Oral Daily Jadapalle, Sree, MD   60 mg at 03/11/24 0840   levETIRAcetam  (KEPPRA ) tablet  1,000 mg  1,000 mg Oral BID McLauchlin, Angela, NP   1,000 mg at 03/11/24 0840   lisinopril  (ZESTRIL ) tablet 10 mg  10 mg Oral Daily Jadapalle, Sree, MD   10 mg at 03/11/24 9160   multivitamin with minerals tablet 1 tablet  1 tablet Oral Daily Mannie Jerel PARAS, NP   1 tablet at 03/11/24 9157   nicotine  polacrilex (NICORETTE ) gum 2 mg  2 mg Oral PRN Jadapalle, Sree, MD   2 mg at 03/11/24 9158   OLANZapine  (ZYPREXA ) injection 10 mg  10 mg Intramuscular TID PRN Mannie Jerel PARAS, NP       OLANZapine  (ZYPREXA ) injection 5 mg  5 mg Intramuscular TID PRN Mannie Jerel PARAS, NP       OLANZapine  zydis (ZYPREXA ) disintegrating tablet 5 mg  5 mg Oral TID PRN Mannie Jerel PARAS, NP       polyethylene glycol (MIRALAX  / GLYCOLAX ) packet 17 g  17 g Oral Daily PRN Zelene Barga E, PA-C   17 g at 03/10/24 9163   risperiDONE  (RISPERDAL ) tablet 2 mg  2 mg Oral QHS Jadapalle, Sree, MD   2 mg at 03/10/24 2125   thiamine  (VITAMIN B1) injection 100 mg  100 mg Intramuscular Once Mannie Jerel PARAS, NP       thiamine  (VITAMIN B1) tablet 100 mg  100 mg Oral Daily Mannie Jerel PARAS, NP   100 mg at 03/11/24 0841   traZODone  (DESYREL ) tablet 200 mg  200 mg Oral QHS Jadapalle, Sree, MD   200 mg at 03/10/24 2124    Lab Results: No results found for this or any previous visit (from the past 48 hours).  Blood Alcohol level:  Lab Results  Component Value Date   ETH 243 (H) 03/01/2024   ETH 28 (H) 09/25/2023    Metabolic Disorder Labs: Lab Results  Component Value Date   HGBA1C 5.3 08/11/2023   MPG 105.41 08/11/2023   MPG 108.28 03/28/2023   Lab Results  Component Value Date   PROLACTIN 38.0 (H) 09/06/2023   PROLACTIN 12.0 01/22/2023   Lab Results  Component Value Date   CHOL 172 08/11/2023   TRIG 146 08/11/2023   HDL 53 08/11/2023   CHOLHDL 3.2 08/11/2023   VLDL 29 08/11/2023   LDLCALC 90 08/11/2023  LDLCALC 84 03/28/2023    Physical Findings: AIMS:  , ,  ,  ,    CIWA:  CIWA-Ar Total: 0 COWS:       Psychiatric Specialty Exam:  Presentation  General Appearance:  Appropriate for Environment  Eye Contact: Good  Speech: Normal Rate  Speech Volume: Normal    Mood and Affect  Mood: Euthymic  Affect: Appropriate   Thought Process  Thought Processes: Coherent  Descriptions of Associations:Intact  Orientation:Full (Time, Place and Person)  Thought Content:WDL  Hallucinations:No data recorded  Ideas of Reference:None  Suicidal Thoughts:No data recorded  Homicidal Thoughts:No data recorded   Sensorium  Memory: Immediate Good; Recent Good; Remote Poor  Judgment: Fair  Insight: Fair   Art therapist  Concentration: Good  Attention Span: Good  Recall: Good  Fund of Knowledge: Good  Language: Good   Psychomotor Activity  Psychomotor Activity: No data recorded  Musculoskeletal: Strength & Muscle Tone: within normal limits Gait & Station: normal Assets  Assets: Communication Skills    Physical Exam: Physical Exam Vitals and nursing note reviewed.  HENT:     Head: Atraumatic.  Eyes:     Extraocular Movements: Extraocular movements intact.  Pulmonary:     Effort: Pulmonary effort is normal.  Neurological:     Mental Status: He is alert and oriented to person, place, and time.    Review of Systems  Psychiatric/Behavioral:  Positive for depression. Negative for hallucinations and suicidal ideas. The patient is nervous/anxious.    Blood pressure (!) 119/93, pulse 96, temperature (!) 97.3 F (36.3 C), resp. rate 16, height 6' 1 (1.854 m), weight 72.1 kg, SpO2 98%. Body mass index is 20.98 kg/m.  Diagnosis: Principal Problem:   MDD (major depressive disorder), recurrent, severe, with psychosis (HCC)   PLAN: Safety and Monitoring:  -- Voluntary admission to inpatient psychiatric unit for safety, stabilization and treatment  -- Daily contact with patient to assess and evaluate symptoms and progress in  treatment  -- Patient's case to be discussed in multi-disciplinary team meeting  -- Observation Level : q15 minute checks  -- Vital signs:  q12 hours  -- Precautions: suicide, elopement, and assault -- Encouraged patient to participate in unit milieu and in scheduled group therapies  2. Psychiatric Diagnoses and Treatment:  Continue fluoxetine  60 mg daily Risperidal 2 mg at bedtime  Trazodone  200 mg at bedtime      3. Medical Issues Being Addressed:   None Albuterol  as needed Keppra  1000 mg twice daily lisinopril  10 mg daily multivitamin daily thiamine  100 mg daily 4. Discharge Planning:   -- Social work and case management to assist with discharge planning and identification of hospital follow-up needs prior to discharge  -- Estimated LOS: 3-4 days  Donnice FORBES Right, PA-C 03/11/2024, 4:48 PM

## 2024-03-11 NOTE — Group Note (Signed)
 Date:  03/11/2024 Time:  1:56 PM  Group Topic/Focus:  Emotional Education:   The focus of this group is to discuss what feelings/emotions are, and how they are experienced.    Participation Level:  Active  Participation Quality:  Appropriate  Affect:  Appropriate  Cognitive:  Appropriate  Insight: Appropriate  Engagement in Group:  Engaged  Modes of Intervention:  Activity  Additional Comments:    Camellia HERO Zyan Coby 03/11/2024, 1:56 PM

## 2024-03-11 NOTE — Group Note (Signed)
 Recreation Therapy Group Note   Group Topic:General Recreation  Group Date: 03/11/2024 Start Time: 1100 End Time: 1150 Facilitators: Celestia Jeoffrey BRAVO, LRT, CTRS Location: Courtyard  Group Description: [employer]. LRT and patients played games of basketball, drew with chalk, and played corn hole while outside in the courtyard while getting fresh air and sunlight. Music was being played in the background. LRT and peers conversed about different games they have played before, what they do in their free time and anything else that is on their minds. LRT encouraged pts to drink water after being outside, sweating and getting their heart rate up.  Goal Area(s) Addressed: Patient will build on frustration tolerance skills. Patients will partake in a competitive play game with peers. Patients will gain knowledge of new leisure interest/hobby.    Affect/Mood: Appropriate   Participation Level: Active   Participation Quality: Independent   Behavior: Appropriate   Speech/Thought Process: Coherent   Insight: Good   Judgement: Good   Modes of Intervention: Socialization   Patient Response to Interventions:  Receptive   Education Outcome:  Acknowledges education   Clinical Observations/Individualized Feedback: Raymond [name] was active in their participation of session activities and group discussion. Pt interacted well with LRT and peers duration of session.    Plan: Continue to engage patient in RT group sessions 2-3x/week.   Jeoffrey BRAVO Celestia, LRT, CTRS 03/11/2024 12:04 PM

## 2024-03-11 NOTE — Progress Notes (Signed)
   03/11/24 0900  Psych Admission Type (Psych Patients Only)  Admission Status Voluntary  Psychosocial Assessment  Patient Complaints Anxiety;Depression (although patient endorses these symptoms, he is very animated and joking with staff and other patients.)  Eye Contact Fair  Facial Expression Animated  Affect Appropriate to circumstance  Speech Logical/coherent  Interaction Assertive  Motor Activity Pacing;Slow  Appearance/Hygiene In scrubs;Layered clothes;Poor hygiene  Behavior Characteristics Cooperative;Appropriate to situation  Mood Pleasant  Aggressive Behavior  Effect No apparent injury  Thought Process  Coherency WDL  Content WDL  Delusions None reported or observed  Perception WDL  Hallucination None reported or observed  Judgment WDL  Confusion None  Danger to Self  Current suicidal ideation? Denies  Agreement Not to Harm Self Yes  Description of Agreement Verbal  Danger to Others  Danger to Others None reported or observed  Danger to Others Abnormal  Harmful Behavior to others No threats or harm toward other people  Destructive Behavior No threats or harm toward property   Patient's goal for today, per his self-inventory is health, in which he will pray in order to achieve his goal.

## 2024-03-11 NOTE — B[facility] Counselor (Signed)
 CSW attempted to touch base with patient's ACTT team to engage in safe discharge planning.    CSW left HIPAA compliant voicemail.    CSW to continue to assess.   Kitrina Maurin, MSW, LCSWA 03/11/2024 3:07 PM

## 2024-03-11 NOTE — Plan of Care (Signed)
   Problem: Education: Goal: Emotional status will improve Outcome: Progressing Goal: Mental status will improve Outcome: Progressing

## 2024-03-11 NOTE — Plan of Care (Signed)

## 2024-03-11 NOTE — Group Note (Signed)
 Recreation Therapy Group Note   Group Topic:Leisure Education  Group Date: 03/11/2024 Start Time: 1530 End Time: 1630 Facilitators: Celestia Jeoffrey BRAVO, LRT, CTRS Location: Craft Room  Group Description: Leisure. Patients were given the option to choose from journaling, coloring, drawing, making origami, playing with playdoh, listening to music or singing karaoke. LRT and pts discussed the meaning of leisure, the importance of participating in leisure during their free time/when they're outside of the hospital, as well as how our leisure interests can also serve as coping skills.   Goal Area(s) Addressed:  Patient will identify a current leisure interest.  Patient will learn the definition of "leisure". Patient will practice making a positive decision. Patient will have the opportunity to try a new leisure activity. Patient will communicate with peers and LRT.    Affect/Mood: Appropriate   Participation Level: Active and Engaged   Participation Quality: Independent   Behavior: Appropriate, Calm, and Cooperative   Speech/Thought Process: Coherent   Insight: Good   Judgement: Good   Modes of Intervention: Education, Exploration, and Music   Patient Response to Interventions:  Attentive, Engaged, Interested , and Receptive   Education Outcome:  Acknowledges education   Clinical Observations/Individualized Feedback: Raymond [name] was active in their participation of session activities and group discussion. Pt identified sing and cook as things he does in his free time.    Plan: Continue to engage patient in RT group sessions 2-3x/week.   Jeoffrey BRAVO Celestia, LRT, CTRS 03/11/2024 5:09 PM

## 2024-03-11 NOTE — Group Note (Signed)
 Date:  03/11/2024 Time:  2:29 PM  Group Topic/Focus:  Conflict Resolution:   The focus of this group is to discuss the conflict resolution process and how it may be used upon discharge.    Participation Level:  Active  Participation Quality:  Appropriate  Affect:  Appropriate  Cognitive:  Appropriate  Insight: Appropriate  Engagement in Group:  Engaged  Modes of Intervention:  Activity  Additional Comments:    Camellia HERO My Rinke 03/11/2024, 2:29 PM

## 2024-03-12 DIAGNOSIS — F333 Major depressive disorder, recurrent, severe with psychotic symptoms: Secondary | ICD-10-CM | POA: Diagnosis not present

## 2024-03-12 NOTE — Progress Notes (Signed)
   03/12/24 1000  Psych Admission Type (Psych Patients Only)  Admission Status Voluntary  Psychosocial Assessment  Patient Complaints None  Eye Contact Fair  Facial Expression Animated  Affect Appropriate to circumstance  Speech Logical/coherent  Interaction Assertive  Motor Activity Slow  Appearance/Hygiene In scrubs;Layered clothes;Poor hygiene  Behavior Characteristics Cooperative  Mood Pleasant;Elated  Aggressive Behavior  Effect No apparent injury  Thought Process  Coherency WDL  Content WDL  Delusions None reported or observed  Perception WDL  Hallucination None reported or observed  Judgment WDL  Confusion None  Danger to Self  Current suicidal ideation? Denies  Agreement Not to Harm Self Yes  Description of Agreement Verbal  Danger to Others  Danger to Others None reported or observed  Danger to Others Abnormal  Harmful Behavior to others No threats or harm toward other people  Destructive Behavior No threats or harm toward property

## 2024-03-12 NOTE — Progress Notes (Signed)
 Patient ID: Raymond [name], male   DOB: Nov 15, 1980, 43 y.o.   MRN: [id number] [facility] [place] MD Progress Note  03/12/2024 1:08 PM Raymond [name]  MRN:  [id number] 43 y/o M with a PMH of seizures, PTSD from prior military service in [place], anxiety and depression who presented to the ER after he reported ingesting 70 pills ( including keppra , trazodone  and his BP medicine) over a period of 2 days in a suicide attempt. He reports recent stressors include 3 seizure episodes, 2 panic attacks last month worsening depression and anxiety, multiple life events including his recent diagnoses of lung cancer,  the death of his mother from cancer 3 years ago and  2 miscarriages with his ex girlfriend in 2014.Patient is admitted to adult psych unit with Q15 min safety monitoring. Multidisciplinary team approach is offered. Medication management; group/milieu therapy is offered.   03/12/24. On follow-up patient is alert and oriented.  They are pleasant and cooperative on exam.  They deny adverse effects of medications.  They deny SI, HI, and AVH.  They voiced no concerns or complaints at this time.  They demonstrate good insight into the need for outpatient follow-up and medication compliance.   03/11/24: Seen for follow-up today they are alert and oriented.  They are cooperative they deny SI, HI, and AVH.  No behavioral concerns overnight.  Will stepdown from daytime lockout.  Will return to normal diet.  Given reported attempt earlier in the week we will continue to monitor for safety.  Discussed likely for discharge early next week.  We are attempting to get in contact with the ACT team so that they may come and evaluate the patient.  No medication adjustments at this time.  03/10/2024: Patient seen today for follow-up.  They are found in the day room.  They are alert and oriented.  They are linear logical and future oriented.  They deny SI, HI, and AVH.  There were no behavioral concerns overnight.  They continue to be on daytime  lockout will step down to normal precautions if they continue to do well.  They have asked services through the [facility] they are unsure who their provider is.  They also have case management.  They have stable housing.  They are able to discuss the crisis resources.  There continues to be concern for malingering.  03/09/2024 patient seen for follow-up today they are observed in the common area and observed laughing and joking with other patients however when it is time for the interview they are disinterested they are heard telling other patients that he no one going to discharge them.  There continues to be concern for malingering.  Patient denies SI, HI and AVH.  Patient was on one-to-one yesterday due to reports that he attempted suicide in his room when questioned about it today not responding he again appears disinterested in the exam.  He denies current SI HI and AVH.  He voices concerns about being discharged indicating that he would die without his seizure so there is some forward thinking and concern for his own safety.  03/08/2024: Patient seen for follow-up.  He indicates that he attempted to hang himself in his room last night with his towel around midnight.  Discussed coping mechanisms and he expressed that he was just alone last night but he would be able to use them at home he then has a bizarre discussion about how he has a lot of pills at home and that he lives next to a doctor who  will sometimes help him with the pills.  He denies current SI, HI, and AVH.   Given the reports of attempted suicide last night we will place patient on one-to-one.   03/07/2024: hart reviewed, case discussed in multidisciplinary meeting, patient seen during rounds. Patient is reassessed on the inpatient unit. He has a bright affect, denies SI/HI/AVH. Notes stable appetite, and sleep. Notes mood lability but is then unable to elaborate. No behavioral prns overnight. They are medication compliant. They are observed in the  unit milieu.   03/06/2024: Subjective:  Chart reviewed, case discussed in multidisciplinary meeting, patient seen during rounds. Patient is reassessed on the inpatient unit. He has a bright affect however states that suicidal thoughts are intermittent. Does not appear to be responding internal stimuli. There was a situation on the unit of which he was making inappropriate comments to females in Spanish and was addressed by staff. He states that I meant no harm. There has been no seizure activity. Depressive symptoms are vague at this time. Suicidal thoughts are also passive and denies plan or intent. He is sleeping and eating fine and has bright affect when engaging with peers and staff. He denies HI or AVH.   03/05/2024: Subjective:  Chart reviewed, case discussed in multidisciplinary meeting, patient seen during rounds. Patient is reassessed on the inpatient unit and explains that he continues to have depression. He has concerns regarding his home medications and medications were reviewed with patient and informed that inpatient medications will be continued at discharge. He continues to endorses passive suicidal thoughts without any plan or intent. Sleep continues to be broken however states that this is his baseline. Patient denies homicidal ideation or AVH at this time. There has been no inappropriate behaviors, seizure activity or use of PRN medications for BH reasons in the past 24 hours. Will continue to monitor improvement and response to treatment plan.    Sleep: Good  Appetite:  Good  Past Psychiatric History: see h&P Family History: History reviewed. No pertinent family history. Social History:  Social History   Substance and Sexual Activity  Alcohol Use Yes   Comment: 1 liter bottle of vodka prior to arrival     Social History   Substance and Sexual Activity  Drug Use Yes   Types: Methamphetamines    Social History   Socioeconomic History   Marital status: Single     Spouse name: Not on file   Number of children: Not on file   Years of education: Not on file   Highest education level: Not on file  Occupational History   Not on file  Tobacco Use   Smoking status: Every Day    Current packs/day: 1.00    Types: Cigarettes    Passive exposure: Current   Smokeless tobacco: Never  Vaping Use   Vaping status: Never Used  Substance and Sexual Activity   Alcohol use: Yes    Comment: 1 liter bottle of vodka prior to arrival   Drug use: Yes    Types: Methamphetamines   Sexual activity: Not Currently  Other Topics Concern   Not on file  Social History Narrative   Not on file   Social Drivers of Health   Financial Resource Strain: Not on File (08/18/2022)   Received from [employer]    Financial Resource Strain: 0  Food Insecurity: No Food Insecurity (03/02/2024)   Hunger Vital Sign    Worried About Running Out of Food in the Last  Year: Never true    Ran Out of Food in the Last Year: Never true  Transportation Needs: No Transportation Needs (03/02/2024)   PRAPARE - Administrator, Civil Service (Medical): No    Lack of Transportation (Non-Medical): No  Physical Activity: Not on File (08/18/2022)   Received from [facility]   Physical Activity    Physical Activity: 0  Stress: Not on File (08/18/2022)   Received from [facility]   Stress    Stress: 0  Social Connections: Not on File (02/15/2023)   Received from [facility]   Social Connections    Connectedness: 0   Past Medical History:  Past Medical History:  Diagnosis Date   Alcohol abuse    Anxiety    Hepatitis C    PTSD (post-traumatic stress disorder)     Past Surgical History:  Procedure Laterality Date   FACIAL FRACTURE SURGERY     metal plate under right eye    Current Medications: Current Facility-Administered Medications  Medication Dose Route Frequency Provider Last Rate Last Admin   albuterol  (VENTOLIN  HFA) 108 (90 Base) MCG/ACT inhaler 2 puff  2 puff  Inhalation Q4H PRN Jadapalle, Sree, MD   2 puff at 03/12/24 0814   alum & mag hydroxide-simeth (MAALOX/MYLANTA) 200-200-20 MG/5ML suspension 30 mL  30 mL Oral Q4H PRN Mannie Jerel PARAS, NP       FLUoxetine  (PROZAC ) capsule 60 mg  60 mg Oral Daily Jadapalle, Sree, MD   60 mg at 03/12/24 0813   levETIRAcetam  (KEPPRA ) tablet 1,000 mg  1,000 mg Oral BID McLauchlin, Jon, NP   1,000 mg at 03/12/24 9185   lisinopril  (ZESTRIL ) tablet 10 mg  10 mg Oral Daily Jadapalle, Sree, MD   10 mg at 03/12/24 0813   multivitamin with minerals tablet 1 tablet  1 tablet Oral Daily Mannie Jerel PARAS, NP   1 tablet at 03/12/24 9185   nicotine  polacrilex (NICORETTE ) gum 2 mg  2 mg Oral PRN Jadapalle, Sree, MD   2 mg at 03/12/24 9185   OLANZapine  (ZYPREXA ) injection 10 mg  10 mg Intramuscular TID PRN Mannie Jerel PARAS, NP       OLANZapine  (ZYPREXA ) injection 5 mg  5 mg Intramuscular TID PRN Mannie Jerel PARAS, NP       OLANZapine  zydis (ZYPREXA ) disintegrating tablet 5 mg  5 mg Oral TID PRN Mannie Jerel PARAS, NP       polyethylene glycol (MIRALAX  / GLYCOLAX ) packet 17 g  17 g Oral Daily PRN Debbra Digiulio E, PA-C   17 g at 03/10/24 9163   risperiDONE  (RISPERDAL ) tablet 2 mg  2 mg Oral QHS Jadapalle, Sree, MD   2 mg at 03/11/24 2116   thiamine  (VITAMIN B1) injection 100 mg  100 mg Intramuscular Once Mannie Jerel PARAS, NP       thiamine  (VITAMIN B1) tablet 100 mg  100 mg Oral Daily Mannie Jerel PARAS, NP   100 mg at 03/12/24 0813   traZODone  (DESYREL ) tablet 200 mg  200 mg Oral QHS Jadapalle, Sree, MD   200 mg at 03/11/24 2115    Lab Results: No results found for this or any previous visit (from the past 48 hours).  Blood Alcohol level:  Lab Results  Component Value Date   ETH 243 (H) 03/01/2024   ETH 28 (H) 09/25/2023    Metabolic Disorder Labs: Lab Results  Component Value Date   HGBA1C 5.3 08/11/2023   MPG 105.41 08/11/2023   MPG 108.28 03/28/2023  Lab Results  Component Value Date   PROLACTIN 38.0 (H)  09/06/2023   PROLACTIN 12.0 01/22/2023   Lab Results  Component Value Date   CHOL 172 08/11/2023   TRIG 146 08/11/2023   HDL 53 08/11/2023   CHOLHDL 3.2 08/11/2023   VLDL 29 08/11/2023   LDLCALC 90 08/11/2023   LDLCALC 84 03/28/2023    Physical Findings: AIMS:  , ,  ,  ,    CIWA:  CIWA-Ar Total: 0 COWS:      Psychiatric Specialty Exam:  Presentation  General Appearance:  Appropriate for Environment  Eye Contact: Good  Speech: Normal Rate  Speech Volume: Normal    Mood and Affect  Mood: Euthymic  Affect: Appropriate   Thought Process  Thought Processes: Coherent  Descriptions of Associations:Intact  Orientation:Full (Time, Place and Person)  Thought Content:WDL  Hallucinations:No data recorded  Ideas of Reference:None  Suicidal Thoughts:No data recorded  Homicidal Thoughts:No data recorded   Sensorium  Memory: Immediate Good; Recent Good; Remote Poor  Judgment: Fair  Insight: Fair   Art therapist  Concentration: Good  Attention Span: Good  Recall: Good  Fund of Knowledge: Good  Language: Good   Psychomotor Activity  Psychomotor Activity: No data recorded  Musculoskeletal: Strength & Muscle Tone: within normal limits Gait & Station: normal Assets  Assets: Communication Skills    Physical Exam: Physical Exam Vitals and nursing note reviewed.  HENT:     Head: Atraumatic.  Eyes:     Extraocular Movements: Extraocular movements intact.  Pulmonary:     Effort: Pulmonary effort is normal.  Neurological:     Mental Status: He is alert and oriented to person, place, and time.    Review of Systems  Psychiatric/Behavioral:  Positive for depression. Negative for hallucinations and suicidal ideas. The patient is nervous/anxious.    Blood pressure 117/84, pulse 83, temperature (!) 96.7 F (35.9 C), resp. rate 16, height 6' 1 (1.854 m), weight 72.1 kg, SpO2 99%. Body mass index is 20.98  kg/m.  Diagnosis: Principal Problem:   MDD (major depressive disorder), recurrent, severe, with psychosis (HCC)   PLAN: Safety and Monitoring:  -- Voluntary admission to inpatient psychiatric unit for safety, stabilization and treatment  -- Daily contact with patient to assess and evaluate symptoms and progress in treatment  -- Patient's case to be discussed in multi-disciplinary team meeting  -- Observation Level : q15 minute checks  -- Vital signs:  q12 hours  -- Precautions: suicide, elopement, and assault -- Encouraged patient to participate in unit milieu and in scheduled group therapies  2. Psychiatric Diagnoses and Treatment:  Continue fluoxetine  60 mg daily Risperidal 2 mg at bedtime  Trazodone  200 mg at bedtime      3. Medical Issues Being Addressed:   None Albuterol  as needed Keppra  1000 mg twice daily lisinopril  10 mg daily multivitamin daily thiamine  100 mg daily 4. Discharge Planning:   -- Social work and case management to assist with discharge planning and identification of hospital follow-up needs prior to discharge  -- Estimated LOS: 3-4 days  Donnice FORBES Right, PA-C 03/12/2024, 1:08 PM

## 2024-03-12 NOTE — Group Note (Signed)
 Date:  03/12/2024 Time:  4:27 PM  Group Topic/Focus:  Healthy Communication:   The focus of this group is to discuss communication, barriers to communication, as well as healthy ways to communicate with others.    Participation Level:  Active  Participation Quality:  Appropriate  Affect:  Appropriate  Cognitive:  Appropriate  Insight: Appropriate  Engagement in Group:  Engaged  Modes of Intervention:  Activity  Additional Comments:    Camellia HERO Zaden Sako 03/12/2024, 4:27 PM

## 2024-03-12 NOTE — Plan of Care (Signed)
   Problem: Education: Goal: Knowledge of [facility] Education information/materials will improve Outcome: Progressing   Problem: Education: Goal: Emotional status will improve Outcome: Progressing   Problem: Education: Goal: Mental status will improve Outcome: Progressing   Problem: Education: Goal: Verbalization of understanding the information provided will improve Outcome: Progressing

## 2024-03-12 NOTE — Progress Notes (Signed)
 Patient alert and oriented x 4, denies SI/HI/AVH interacting appropriately affect congruent with mood, thoughts are organized and coherent.15 minutes safety checks maintained.

## 2024-03-12 NOTE — Plan of Care (Signed)

## 2024-03-12 NOTE — Group Note (Signed)
 Date:  03/12/2024 Time:  9:36 PM  Group Topic/Focus:  Emotional Education:   The focus of this group is to discuss what feelings/emotions are, and how they are experienced. Making Healthy Choices:   The focus of this group is to help patients identify negative/unhealthy choices they were using prior to admission and identify positive/healthier coping strategies to replace them upon discharge. Self Care:   The focus of this group is to help patients understand the importance of self-care in order to improve or restore emotional, physical, spiritual, interpersonal, and financial health.    Participation Level:  Active  Participation Quality:  Appropriate  Affect:  Appropriate  Cognitive:  Appropriate and Oriented  Insight: Appropriate  Engagement in Group:  Engaged  Modes of Intervention:  Discussion and Support  Additional Comments:  N/A  Butler LITTIE Gelineau 03/12/2024, 9:36 PM

## 2024-03-13 DIAGNOSIS — F333 Major depressive disorder, recurrent, severe with psychotic symptoms: Secondary | ICD-10-CM | POA: Diagnosis not present

## 2024-03-13 NOTE — Plan of Care (Signed)
  Problem: Education: Goal: Knowledge of [facility] Education information/materials will improve Outcome: Progressing   Problem: Education: Goal: Emotional status will improve Outcome: Progressing   Problem: Education: Goal: Mental status will improve Outcome: Progressing   

## 2024-03-13 NOTE — Progress Notes (Signed)
 Patient ID: Raymond [name], male   DOB: 1980-10-24, 43 y.o.   MRN: [id number] [facility] MD Progress Note  03/13/2024 9:01 AM Raymond [name]  MRN:  [id number] 43 y/o M with a PMH of seizures, PTSD from prior military service in [place], anxiety and depression who presented to the ER after he reported ingesting 70 pills ( including keppra , trazodone  and his BP medicine) over a period of 2 days in a suicide attempt. He reports recent stressors include 3 seizure episodes, 2 panic attacks last month worsening depression and anxiety, multiple life events including his recent diagnoses of lung cancer,  the death of his mother from cancer 3 years ago and  2 miscarriages with his ex girlfriend in 2014.Patient is admitted to adult psych unit with Q15 min safety monitoring. Multidisciplinary team approach is offered. Medication management; group/milieu therapy is offered.   03/13/24: On follow-up patient is alert and oriented.  They are pleasant and cooperative on exam.  They deny adverse effects of medications.  They deny SI, HI, and AVH.  They voiced no concerns or complaints at this time.  They demonstrate good insight into the need for outpatient follow-up and medication compliance. Discuss coping mechanisms as spending time with friends, reading bible, and going for walks. Discussed crisis resources and social support he identifies Dempsey from his ACT team, his [facility], [facility] crisis line, 911, and 988. He notes stable mood appetite and sleep. His blood pressure was elevated this morning discussed medication adjustment and he prefers to follow up with his [facility] provider this week for an adjustment. Plan for discharge Mon/Tues. No behavioral PRNs overnight. Rates depression 0/10 and anxiety 3/10.    03/12/24. On follow-up patient is alert and oriented.  They are pleasant and cooperative on exam.  They deny adverse effects of medications.  They deny SI, HI, and AVH.  They voiced no concerns or complaints at this time.   They demonstrate good insight into the need for outpatient follow-up and medication compliance.   03/11/24: Seen for follow-up today they are alert and oriented.  They are cooperative they deny SI, HI, and AVH.  No behavioral concerns overnight.  Will stepdown from daytime lockout.  Will return to normal diet.  Given reported attempt earlier in the week we will continue to monitor for safety.  Discussed likely for discharge early next week.  We are attempting to get in contact with the ACT team so that they may come and evaluate the patient.  No medication adjustments at this time.  03/10/2024: Patient seen today for follow-up.  They are found in the day room.  They are alert and oriented.  They are linear logical and future oriented.  They deny SI, HI, and AVH.  There were no behavioral concerns overnight.  They continue to be on daytime lockout will step down to normal precautions if they continue to do well.  They have asked services through the [facility] they are unsure who their provider is.  They also have case management.  They have stable housing.  They are able to discuss the crisis resources.  There continues to be concern for malingering.  03/09/2024 patient seen for follow-up today they are observed in the common area and observed laughing and joking with other patients however when it is time for the interview they are disinterested they are heard telling other patients that he no one going to discharge them.  There continues to be concern for malingering.  Patient denies SI, HI  and AVH.  Patient was on one-to-one yesterday due to reports that he attempted suicide in his room when questioned about it today not responding he again appears disinterested in the exam.  He denies current SI HI and AVH.  He voices concerns about being discharged indicating that he would die without his seizure so there is some forward thinking and concern for his own safety.  03/08/2024: Patient seen for follow-up.  He  indicates that he attempted to hang himself in his room last night with his towel around midnight.  Discussed coping mechanisms and he expressed that he was just alone last night but he would be able to use them at home he then has a bizarre discussion about how he has a lot of pills at home and that he lives next to a doctor who will sometimes help him with the pills.  He denies current SI, HI, and AVH.   Given the reports of attempted suicide last night we will place patient on one-to-one.   03/07/2024: hart reviewed, case discussed in multidisciplinary meeting, patient seen during rounds. Patient is reassessed on the inpatient unit. He has a bright affect, denies SI/HI/AVH. Notes stable appetite, and sleep. Notes mood lability but is then unable to elaborate. No behavioral prns overnight. They are medication compliant. They are observed in the unit milieu.   03/06/2024: Subjective:  Chart reviewed, case discussed in multidisciplinary meeting, patient seen during rounds. Patient is reassessed on the inpatient unit. He has a bright affect however states that suicidal thoughts are intermittent. Does not appear to be responding internal stimuli. There was a situation on the unit of which he was making inappropriate comments to females in Spanish and was addressed by staff. He states that I meant no harm. There has been no seizure activity. Depressive symptoms are vague at this time. Suicidal thoughts are also passive and denies plan or intent. He is sleeping and eating fine and has bright affect when engaging with peers and staff. He denies HI or AVH.   03/05/2024: Subjective:  Chart reviewed, case discussed in multidisciplinary meeting, patient seen during rounds. Patient is reassessed on the inpatient unit and explains that he continues to have depression. He has concerns regarding his home medications and medications were reviewed with patient and informed that inpatient medications will be continued at  discharge. He continues to endorses passive suicidal thoughts without any plan or intent. Sleep continues to be broken however states that this is his baseline. Patient denies homicidal ideation or AVH at this time. There has been no inappropriate behaviors, seizure activity or use of PRN medications for BH reasons in the past 24 hours. Will continue to monitor improvement and response to treatment plan.    Sleep: Good  Appetite:  Good  Past Psychiatric History: see h&P Family History: History reviewed. No pertinent family history. Social History:  Social History   Substance and Sexual Activity  Alcohol Use Yes   Comment: 1 liter bottle of vodka prior to arrival     Social History   Substance and Sexual Activity  Drug Use Yes   Types: Methamphetamines    Social History   Socioeconomic History   Marital status: Single    Spouse name: Not on file   Number of children: Not on file   Years of education: Not on file   Highest education level: Not on file  Occupational History   Not on file  Tobacco Use   Smoking status: Every Day  Current packs/day: 1.00    Types: Cigarettes    Passive exposure: Current   Smokeless tobacco: Never  Vaping Use   Vaping status: Never Used  Substance and Sexual Activity   Alcohol use: Yes    Comment: 1 liter bottle of vodka prior to arrival   Drug use: Yes    Types: Methamphetamines   Sexual activity: Not Currently  Other Topics Concern   Not on file  Social History Narrative   Not on file   Social Drivers of Health   Financial Resource Strain: Not on File (08/18/2022)   Received from [employer]    Financial Resource Strain: 0  Food Insecurity: No Food Insecurity (03/02/2024)   Hunger Vital Sign    Worried About Running Out of Food in the Last Year: Never true    Ran Out of Food in the Last Year: Never true  Transportation Needs: No Transportation Needs (03/02/2024)   PRAPARE - Scientist, research (physical sciences) (Medical): No    Lack of Transportation (Non-Medical): No  Physical Activity: Not on File (08/18/2022)   Received from [facility]   Physical Activity    Physical Activity: 0  Stress: Not on File (08/18/2022)   Received from [facility]   Stress    Stress: 0  Social Connections: Not on File (02/15/2023)   Received from [employer]   Social Connections    Connectedness: 0   Past Medical History:  Past Medical History:  Diagnosis Date   Alcohol abuse    Anxiety    Hepatitis C    PTSD (post-traumatic stress disorder)     Past Surgical History:  Procedure Laterality Date   FACIAL FRACTURE SURGERY     metal plate under right eye    Current Medications: Current Facility-Administered Medications  Medication Dose Route Frequency Provider Last Rate Last Admin   albuterol  (VENTOLIN  HFA) 108 (90 Base) MCG/ACT inhaler 2 puff  2 puff Inhalation Q4H PRN Jadapalle, Sree, MD   2 puff at 03/12/24 1751   alum & mag hydroxide-simeth (MAALOX/MYLANTA) 200-200-20 MG/5ML suspension 30 mL  30 mL Oral Q4H PRN Mannie Jerel PARAS, NP       FLUoxetine  (PROZAC ) capsule 60 mg  60 mg Oral Daily Jadapalle, Sree, MD   60 mg at 03/13/24 9175   levETIRAcetam  (KEPPRA ) tablet 1,000 mg  1,000 mg Oral BID McLauchlin, Jon, NP   1,000 mg at 03/13/24 9175   lisinopril  (ZESTRIL ) tablet 10 mg  10 mg Oral Daily Jadapalle, Sree, MD   10 mg at 03/13/24 9175   multivitamin with minerals tablet 1 tablet  1 tablet Oral Daily Mannie Jerel PARAS, NP   1 tablet at 03/13/24 9175   nicotine  polacrilex (NICORETTE ) gum 2 mg  2 mg Oral PRN Jadapalle, Sree, MD   2 mg at 03/12/24 9185   OLANZapine  (ZYPREXA ) injection 10 mg  10 mg Intramuscular TID PRN Mannie Jerel PARAS, NP       OLANZapine  (ZYPREXA ) injection 5 mg  5 mg Intramuscular TID PRN Mannie Jerel PARAS, NP       OLANZapine  zydis (ZYPREXA ) disintegrating tablet 5 mg  5 mg Oral TID PRN Mannie Jerel PARAS, NP       polyethylene glycol (MIRALAX  / GLYCOLAX ) packet 17 g  17 g Oral Daily PRN  Alix Stowers E, PA-C   17 g at 03/10/24 0836   risperiDONE  (RISPERDAL ) tablet 2 mg  2 mg Oral QHS Jadapalle, Sree, MD   2  mg at 03/12/24 2120   thiamine  (VITAMIN B1) injection 100 mg  100 mg Intramuscular Once Mannie Jerel PARAS, NP       thiamine  (VITAMIN B1) tablet 100 mg  100 mg Oral Daily Mannie Jerel PARAS, NP   100 mg at 03/13/24 9175   traZODone  (DESYREL ) tablet 200 mg  200 mg Oral QHS Jadapalle, Sree, MD   200 mg at 03/12/24 2121    Lab Results: No results found for this or any previous visit (from the past 48 hours).  Blood Alcohol level:  Lab Results  Component Value Date   ETH 243 (H) 03/01/2024   ETH 28 (H) 09/25/2023    Metabolic Disorder Labs: Lab Results  Component Value Date   HGBA1C 5.3 08/11/2023   MPG 105.41 08/11/2023   MPG 108.28 03/28/2023   Lab Results  Component Value Date   PROLACTIN 38.0 (H) 09/06/2023   PROLACTIN 12.0 01/22/2023   Lab Results  Component Value Date   CHOL 172 08/11/2023   TRIG 146 08/11/2023   HDL 53 08/11/2023   CHOLHDL 3.2 08/11/2023   VLDL 29 08/11/2023   LDLCALC 90 08/11/2023   LDLCALC 84 03/28/2023    Physical Findings: AIMS:  , ,  ,  ,    CIWA:  CIWA-Ar Total: 0 COWS:      Psychiatric Specialty Exam:  Presentation  General Appearance:  Appropriate for Environment  Eye Contact: Good  Speech: Normal Rate  Speech Volume: Normal    Mood and Affect  Mood: Euthymic  Affect: Appropriate   Thought Process  Thought Processes: Coherent  Descriptions of Associations:Intact  Orientation:Full (Time, Place and Person)  Thought Content:WDL  Hallucinations:No data recorded  Ideas of Reference:None  Suicidal Thoughts:No data recorded  Homicidal Thoughts:No data recorded   Sensorium  Memory: Immediate Good; Recent Good; Remote Poor  Judgment: Fair  Insight: Fair   Art therapist  Concentration: Good  Attention Span: Good  Recall: Good  Fund of  Knowledge: Good  Language: Good   Psychomotor Activity  Psychomotor Activity: No data recorded  Musculoskeletal: Strength & Muscle Tone: within normal limits Gait & Station: normal Assets  Assets: Communication Skills    Physical Exam: Physical Exam Vitals and nursing note reviewed.  HENT:     Head: Atraumatic.  Eyes:     Extraocular Movements: Extraocular movements intact.  Pulmonary:     Effort: Pulmonary effort is normal.  Neurological:     Mental Status: He is alert and oriented to person, place, and time.    Review of Systems  Psychiatric/Behavioral:  Negative for depression, hallucinations, substance abuse and suicidal ideas. The patient is nervous/anxious. The patient does not have insomnia.    Blood pressure (!) 130/102, pulse (!) 103, temperature (!) 97.1 F (36.2 C), resp. rate 18, height 6' 1 (1.854 m), weight 72.1 kg, SpO2 98%. Body mass index is 20.98 kg/m.  Diagnosis: Principal Problem:   MDD (major depressive disorder), recurrent, severe, with psychosis (HCC)   PLAN: Safety and Monitoring:  -- Voluntary admission to inpatient psychiatric unit for safety, stabilization and treatment  -- Daily contact with patient to assess and evaluate symptoms and progress in treatment  -- Patient's case to be discussed in multi-disciplinary team meeting  -- Observation Level : q15 minute checks  -- Vital signs:  q12 hours  -- Precautions: suicide, elopement, and assault -- Encouraged patient to participate in unit milieu and in scheduled group therapies  2. Psychiatric Diagnoses and Treatment:  Continue fluoxetine  60  mg daily Risperidal 2 mg at bedtime  Trazodone  200 mg at bedtime      3. Medical Issues Being Addressed:   None Albuterol  as needed Keppra  1000 mg twice daily lisinopril  10 mg daily multivitamin daily thiamine  100 mg daily 4. Discharge Planning:   -- Mon/Tues  -- Social work and case management to assist with discharge planning and  identification of hospital follow-up needs prior to discharge  -- Estimated LOS: 3-4 days  Donnice FORBES Right, PA-C 03/13/2024, 9:01 AM

## 2024-03-13 NOTE — Progress Notes (Signed)
   03/13/24 0824  Psych Admission Type (Psych Patients Only)  Admission Status Voluntary  Psychosocial Assessment  Patient Complaints None  Eye Contact Fair  Facial Expression Animated  Affect Appropriate to circumstance  Speech Logical/coherent  Interaction Assertive  Motor Activity Other (Comment) (WDL)  Appearance/Hygiene Unremarkable  Behavior Characteristics Cooperative;Hyperactive  Mood Pleasant;Euthymic  Thought Process  Coherency WDL  Content WDL  Delusions None reported or observed  Perception WDL  Hallucination None reported or observed  Judgment Poor  Confusion None  Danger to Self  Current suicidal ideation? Denies  Agreement Not to Harm Self Yes  Description of Agreement Verbal  Danger to Others  Danger to Others None reported or observed

## 2024-03-13 NOTE — BHH Suicide Risk Assessment (Signed)
 [facility] Discharge Suicide Risk Assessment   Principal Problem: MDD (major depressive disorder), recurrent, severe, with psychosis (HCC) Discharge Diagnoses: Principal Problem:   MDD (major depressive disorder), recurrent, severe, with psychosis (HCC)   Total Time spent with patient: 30 minutes  Musculoskeletal: Strength & Muscle Tone: within normal limits Gait & Station: normal Patient leans: N/A  Psychiatric Specialty Exam  Presentation  General Appearance:  Appropriate for Environment  Eye Contact: Fair  Speech: Normal Rate  Speech Volume: Normal  Handedness: Right   Mood and Affect  Mood: Euthymic  Duration of Depression Symptoms: Greater than two weeks  Affect: Appropriate   Thought Process  Thought Processes: Coherent  Descriptions of Associations:Intact  Orientation:Full (Time, Place and Person)  Thought Content:Logical  History of Schizophrenia/Schizoaffective disorder:No  Duration of Psychotic Symptoms:No data recorded Hallucinations:Hallucinations: None  Ideas of Reference:None  Suicidal Thoughts:Suicidal Thoughts: No  Homicidal Thoughts:Homicidal Thoughts: No   Sensorium  Memory: Immediate Fair; Recent Fair  Judgment: Fair  Insight: Fair   Art therapist  Concentration: Fair  Attention Span: Fair  Recall: [employer] of Knowledge: Fair  Language: Fair   Psychomotor Activity  Psychomotor Activity: Psychomotor Activity: Normal   Assets  Assets: Communication Skills; Desire for Improvement; Physical Health   Sleep  Sleep: Sleep: Fair  Estimated Sleeping Duration (Last 24 Hours): 5.50-6.75 hours  Physical Exam: Physical Exam Vitals and nursing note reviewed.    ROS Blood pressure (!) 130/102, pulse (!) 103, temperature (!) 97.1 F (36.2 C), resp. rate 18, height 6' 1 (1.854 m), weight 72.1 kg, SpO2 98%. Body mass index is 20.98 kg/m.  Mental Status Per Nursing Assessment::   On Admission:   NA  Demographic Factors:  Caucasian  Loss Factors: Decrease in vocational status  Historical Factors: Impulsivity  Risk Reduction Factors:   Living with another person, especially a relative, Positive social support, Positive therapeutic relationship, and Positive coping skills or problem solving skills  Continued Clinical Symptoms:  Depression:   Impulsivity  Cognitive Features That Contribute To Risk:  None    Suicide Risk:  Minimal: No identifiable suicidal ideation.  Patients presenting with no risk factors but with morbid ruminations; may be classified as minimal risk based on the severity of the depressive symptoms    Plan Of Care/Follow-up recommendations:  Activity:  As tolerated  Allyn Foil, MD 03/13/2024, 11:33 PM

## 2024-03-13 NOTE — Progress Notes (Signed)
   03/12/24 2000  Psych Admission Type (Psych Patients Only)  Admission Status Voluntary  Psychosocial Assessment  Patient Complaints None  Eye Contact Fair  Facial Expression Animated  Affect Appropriate to circumstance  Speech Logical/coherent  Interaction Assertive  Motor Activity Pacing  Appearance/Hygiene Improved;Layered clothes  Behavior Characteristics Cooperative  Mood Pleasant  Aggressive Behavior  Effect No apparent injury  Thought Process  Coherency WDL  Content WDL  Delusions None reported or observed  Perception WDL  Hallucination None reported or observed  Judgment WDL  Confusion None  Danger to Self  Current suicidal ideation? Denies  Agreement Not to Harm Self Yes  Description of Agreement verbal  Danger to Others  Danger to Others None reported or observed  Danger to Others Abnormal  Harmful Behavior to others No threats or harm toward other people  Destructive Behavior No threats or harm toward property   No distress noted interacting appropriately with peers and staff, affect is congruent with mood, he denies SI/HI/AVH ,thoughts are organized and coherent, he appears less anxious.

## 2024-03-13 NOTE — Plan of Care (Signed)
   Problem: Education: Goal: Emotional status will improve Outcome: Progressing Goal: Mental status will improve Outcome: Progressing Goal: Verbalization of understanding the information provided will improve Outcome: Progressing

## 2024-03-13 NOTE — Group Note (Signed)
 LCSW Group Therapy Note  Group Date: 03/13/2024 Start Time: 1300 End Time: 1400   Type of Therapy and Topic:  Group Therapy: Positive Affirmations  Participation Level:  Active   Description of Group:   This group addressed positive affirmation towards self and others.  Patients went around the room and identified two positive things about themselves and two positive things about a peer in the room.  Patients reflected on how it felt to share something positive with others, to identify positive things about themselves, and to hear positive things from others/ Patients were encouraged to have a daily reflection of positive characteristics or circumstances.   Therapeutic Goals: Patients will verbalize two of their positive qualities Patients will demonstrate empathy for others by stating two positive qualities about a peer in the group Patients will verbalize their feelings when voicing positive self affirmations and when voicing positive affirmations of others Patients will discuss the potential positive impact on their wellness/recovery of focusing on positive traits of self and others.  Summary of Patient Progress: Patient actively engaged in the discussion and was able to identify positive affirmations about themselves as well as other group members. Patient demonstrated proficient insight into the subject matter, was respectful of peers, participated throughout the entire session.  Therapeutic Modalities:   Cognitive Behavioral Therapy Motivational Interviewing    Raymond [name], ISRAEL 03/13/2024  2:59 PM

## 2024-03-14 NOTE — Progress Notes (Signed)
  [facility] Adult Case Management Discharge Plan :  Will you be returning to the same living situation after discharge:  Yes,  Patient to return home.  At discharge, do you have transportation home?: Yes,  CSW has arranged taxi services on patient's behalf.  Do you have the ability to pay for your medications: Yes,  BLUE CROSS BLUE SHIELD / BCBS OTHER  Release of information consent forms completed and in the chart;  Patient's signature needed at discharge.  Patient to Follow up at:  Follow-up Information     [facility]. Go to.   Why: Your ACTT team will follow up following discharge. Please reach out immediately following your discharge to make aware of your arrival home. Contact information: [address] [place] [address]  [phone] Fax: [phone] Email: [email or web address]                Next level of care provider has access to [facility] Link:no  Safety Planning and Suicide Prevention discussed: SPE completed with pt, as pt refused to consent to family contact. SPI pamphlet provided to pt and pt was encouraged to share information with support network, ask questions, and talk about any concerns relating to SPE. Pt denies access to guns/firearms and verbalized understanding of information provided. Mobile Crisis information also provided to pt.    Have you used any form of tobacco in the last 30 days? (Cigarettes, Smokeless Tobacco, Cigars, and/or Pipes): Yes  Has patient been referred to the Quitline?: Patient refused referral for treatment  Patient has been referred for addiction treatment: Yes, the patient will follow up with an outpatient provider for substance use disorder. Psychiatrist/APP: patient to schedule appointment and Therapist: patient to schedule appointment  Alveta CHRISTELLA Kerns, LCSW 03/14/2024, 9:48 AM

## 2024-03-14 NOTE — Group Note (Signed)
 Date:  03/14/2024 Time:  2:34 AM  Group Topic/Focus:  Managing Feelings:   The focus of this group is to identify what feelings patients have difficulty handling and develop a plan to handle them in a healthier way upon discharge. Self Care:   The focus of this group is to help patients understand the importance of self-care in order to improve or restore emotional, physical, spiritual, interpersonal, and financial health.    Participation Level:  Active  Participation Quality:  Appropriate  Affect:  Appropriate  Cognitive:  Appropriate and Oriented  Insight: Appropriate  Engagement in Group:  Engaged  Modes of Intervention:  Discussion and Support  Additional Comments:  N/A  Butler LITTIE Gelineau 03/14/2024, 2:34 AM

## 2024-03-14 NOTE — Plan of Care (Signed)

## 2024-03-14 NOTE — Progress Notes (Signed)
   03/14/24 0400  Psych Admission Type (Psych Patients Only)  Admission Status Voluntary  Psychosocial Assessment  Patient Complaints None  Eye Contact Fair  Facial Expression Animated  Affect Appropriate to circumstance  Speech Logical/coherent  Interaction Assertive  Motor Activity Pacing  Appearance/Hygiene Improved;Layered clothes  Behavior Characteristics Cooperative;Hyperactive  Mood Pleasant;Euthymic  Thought Process  Coherency WDL  Content WDL  Delusions None reported or observed  Perception WDL  Hallucination None reported or observed  Judgment Poor  Confusion None  Danger to Self  Current suicidal ideation? Denies  Agreement Not to Harm Self Yes  Description of Agreement verbal  Danger to Others  Danger to Others None reported or observed  Danger to Others Abnormal  Harmful Behavior to others No threats or harm toward other people  Destructive Behavior No threats or harm toward property   Alert andd oriented x 4, affected is bright interacting appropriately with peers and staff. 15 minutes safety checks maintained.

## 2024-03-14 NOTE — Group Note (Unsigned)
 Date:  03/16/2024 Time:  1:14 PM  Group Topic/Focus:  Activity Group :  The focus of the group is to promote activity for the patients and encourage them to go outside    Participation Level:  Active  Participation Quality:  Appropriate  Affect:  Appropriate  Cognitive:  Appropriate  Insight: Appropriate  Engagement in Group:  Engaged  Modes of Intervention:  Activity  Additional Comments:    Raymond [name] HERO [name] 03/16/2024, 1:14 PM

## 2024-03-14 NOTE — Plan of Care (Signed)
  Problem: Education: Goal: Knowledge of [facility] Education information/materials will improve Outcome: Progressing   Problem: Education: Goal: Emotional status will improve Outcome: Progressing   Problem: Education: Goal: Mental status will improve Outcome: Progressing   

## 2024-03-14 NOTE — Group Note (Signed)
 Date:  03/14/2024 Time:  10:05 AM  Group Topic/Focus:  Goals Group:   The focus of this group is to help patients establish daily goals to achieve during treatment and discuss how the patient can incorporate goal setting into their daily lives to aide in recovery.    Participation Level:  Active  Participation Quality:  Appropriate  Affect:  Appropriate  Cognitive:  Appropriate  Insight: Appropriate  Engagement in Group:  Engaged  Modes of Intervention:  Activity  Additional Comments:    Camellia HERO Franklin Baumbach 03/14/2024, 10:05 AM

## 2024-03-14 NOTE — Progress Notes (Signed)
 Patient ID: Raymond [name], male   DOB: 1981/03/26, 43 y.o.   MRN: [id number]  Discharge Note:  Patient denies SI/HI/AVH at this time. Discharge instructions, AVS, and transition record gone over with patient. Patient given a copy of his Suicide Safety Plan. Patient agrees to comply with medication management, follow-up visit, and outpatient therapy. Patient belongings returned to patient. Patient questions and concerns addressed and answered. Patient ambulatory off unit. Patient discharged to home via [facility] taxicab services.

## 2024-03-14 NOTE — Progress Notes (Signed)
   03/14/24 0900  Psych Admission Type (Psych Patients Only)  Admission Status Voluntary  Psychosocial Assessment  Patient Complaints None  Eye Contact Fair  Facial Expression Animated  Affect Appropriate to circumstance  Speech Logical/coherent  Interaction Assertive  Motor Activity Restless  Appearance/Hygiene In scrubs;Layered clothes  Behavior Characteristics Cooperative;Appropriate to situation  Mood Pleasant  Aggressive Behavior  Effect No apparent injury  Thought Process  Coherency WDL  Content WDL  Delusions None reported or observed  Perception WDL  Hallucination None reported or observed  Judgment WDL  Confusion None  Danger to Self  Current suicidal ideation? Denies  Agreement Not to Harm Self Yes  Description of Agreement Verbal  Danger to Others  Danger to Others None reported or observed  Danger to Others Abnormal  Harmful Behavior to others No threats or harm toward other people  Destructive Behavior No threats or harm toward property   Patient's goal for today, per his self-inventory is discharge.

## 2024-03-14 NOTE — Discharge Summary (Signed)
 Physician Discharge Summary Note  Patient:  Raymond [name] is an 43 y.o., male MRN:  [id number] DOB:  [birth year] Patient phone:  [phone] (home)  Patient address:   [address] [place] [address],   Total time spent: 40 min Date of Admission:  03/02/2024 Date of Discharge: 03/14/24  Reason for Admission:  43 y/o M with a PMH of seizures, PTSD from prior military service in [place], anxiety and depression who presented to the ER after he reported ingesting 70 pills ( including keppra , trazodone  and his BP medicine) over a period of 2 days in a suicide attempt. He reports recent stressors include 3 seizure episodes, 2 panic attacks last month worsening depression and anxiety, multiple life events including his recent diagnoses of lung cancer, the death of his mother from cancer 3 years ago and 2 miscarriages with his ex girlfriend in 2014. Patient is admitted to adult psych unit with Q15 min safety monitoring. Multidisciplinary team approach is offered. Medication management; group/milieu therapy is offered.   Principal Problem: MDD (major depressive disorder), recurrent, severe, with psychosis (HCC) Discharge Diagnoses: Principal Problem:   MDD (major depressive disorder), recurrent, severe, with psychosis (HCC)   Past Psychiatric History: see h&p  Family Psychiatric  History: see h&p Social History:  Social History   Substance and Sexual Activity  Alcohol Use Yes   Comment: 1 liter bottle of vodka prior to arrival     Social History   Substance and Sexual Activity  Drug Use Yes   Types: Methamphetamines    Social History   Socioeconomic History   Marital status: Single    Spouse name: Not on file   Number of children: Not on file   Years of education: Not on file   Highest education level: Not on file  Occupational History   Not on file  Tobacco Use   Smoking status: Every Day    Current packs/day: 1.00    Types: Cigarettes    Passive exposure:  Current   Smokeless tobacco: Never  Vaping Use   Vaping status: Never Used  Substance and Sexual Activity   Alcohol use: Yes    Comment: 1 liter bottle of vodka prior to arrival   Drug use: Yes    Types: Methamphetamines   Sexual activity: Not Currently  Other Topics Concern   Not on file  Social History Narrative   Not on file   Social Drivers of Health   Financial Resource Strain: Not on File (08/18/2022)   Received from [employer]    Financial Resource Strain: 0  Food Insecurity: No Food Insecurity (03/02/2024)   Hunger Vital Sign    Worried About Running Out of Food in the Last Year: Never true    Ran Out of Food in the Last Year: Never true  Transportation Needs: No Transportation Needs (03/02/2024)   PRAPARE - Administrator, Civil Service (Medical): No    Lack of Transportation (Non-Medical): No  Physical Activity: Not on File (08/18/2022)   Received from [facility]   Physical Activity    Physical Activity: 0  Stress: Not on File (08/18/2022)   Received from [facility]   Stress    Stress: 0  Social Connections: Not on File (02/15/2023)   Received from [facility]   Social Connections    Connectedness: 0   Past Medical History:  Past Medical History:  Diagnosis Date   Alcohol abuse    Anxiety    Hepatitis  C    PTSD (post-traumatic stress disorder)     Past Surgical History:  Procedure Laterality Date   FACIAL FRACTURE SURGERY     metal plate under right eye   Family History: History reviewed. No pertinent family history.  Hospital Course:  43 y/o M with a PMH of seizures, PTSD from prior [employer] service in [place], anxiety and depression who presented to the ER after he reported ingesting 70 pills ( including keppra , trazodone  and his BP medicine) over a period of 2 days in a suicide attempt. He reports recent stressors include 3 seizure episodes, 2 panic attacks last month worsening depression and anxiety, multiple life events including his  recent diagnoses of lung cancer, the death of his mother from cancer 3 years ago and 2 miscarriages with his ex girlfriend in 2014. Patient is admitted to adult psych unit with Q15 min safety monitoring. Multidisciplinary team approach is offered. Medication management; group/milieu therapy is offered.  Detailed risk assessment is complete based on clinical exam and individual risk factors and acute suicide risk is low and acute violence risk is low.    On admission, patient was maintained on his Prozac  60 mg daily, Risperdal  2 mg nightly and trazodone .  Patient is well-known to the psychiatric team due to his multiple hospitalizations.  Patient has an ACT team that closely follows up with him outpatient.  Patient maintains safe behaviors on the unit.  He had an episode where he needed one-to-one as it was situational and attention seeking as he had a hard time following the unit rules.  His threats of self harming behaviors resolved in 24 hours and he maintained safe behaviors throughout the rest of the stay.  On the day of discharge he consistently denies SI/HI/plan and denies hallucinations.  He remains future oriented with his ability to work with his ACT team and engage in outpatient mental health services. Currently, all modifiable risk of harm to self/harm to others have been addressed and patient is no longer appropriate for the acute inpatient setting and is able to continue treatment for mental health needs in the community with the supports as indicated below.  Patient is educated and verbalized understanding of discharge plan of care including medications, follow-up appointments, mental health resources and further crisis services in the community.  He is instructed to call 911 or present to the nearest emergency room should he experience any decompensation in mood, disturbance of bowel or return of suicidal/homicidal ideations.  Patient verbalizes understanding of this education and agrees to this  plan of care  Physical Findings: AIMS:  , ,  ,  ,    CIWA:  CIWA-Ar Total: 0 COWS:        Psychiatric Specialty Exam:  Presentation  General Appearance:  Appropriate for Environment  Eye Contact: Fair  Speech: Normal Rate  Speech Volume: Normal    Mood and Affect  Mood: Euthymic  Affect: Appropriate   Thought Process  Thought Processes: Coherent  Descriptions of Associations:Intact  Orientation:Full (Time, Place and Person)  Thought Content:Logical  Hallucinations:Hallucinations: None  Ideas of Reference:None  Suicidal Thoughts:Suicidal Thoughts: No  Homicidal Thoughts:Homicidal Thoughts: No   Sensorium  Memory: Immediate Fair; Recent Fair  Judgment: Fair  Insight: Fair   Art therapist  Concentration: Fair  Attention Span: Fair  Recall: [employer] of Knowledge: Fair  Language: Fair   Psychomotor Activity  Psychomotor Activity: Psychomotor Activity: Normal  Musculoskeletal: Strength & Muscle Tone: within normal limits Gait & Station: normal  Assets  Assets: Manufacturing systems engineer; Desire for Improvement; Physical Health   Sleep  Sleep: Sleep: Fair    Physical Exam: Physical Exam ROS Blood pressure (!) 129/97, pulse 69, temperature 97.9 F (36.6 C), resp. rate 18, height 6' 1 (1.854 m), weight 72.1 kg, SpO2 99%. Body mass index is 20.98 kg/m.   Social History   Tobacco Use  Smoking Status Every Day   Current packs/day: 1.00   Types: Cigarettes   Passive exposure: Current  Smokeless Tobacco Never   Tobacco Cessation:  N/A, patient does not currently use tobacco products   Blood Alcohol level:  Lab Results  Component Value Date   ETH 243 (H) 03/01/2024   ETH 28 (H) 09/25/2023    Metabolic Disorder Labs:  Lab Results  Component Value Date   HGBA1C 5.3 08/11/2023   MPG 105.41 08/11/2023   MPG 108.28 03/28/2023   Lab Results  Component Value Date   PROLACTIN 38.0 (H) 09/06/2023    PROLACTIN 12.0 01/22/2023   Lab Results  Component Value Date   CHOL 172 08/11/2023   TRIG 146 08/11/2023   HDL 53 08/11/2023   CHOLHDL 3.2 08/11/2023   VLDL 29 08/11/2023   LDLCALC 90 08/11/2023   LDLCALC 84 03/28/2023    See Psychiatric Specialty Exam and Suicide Risk Assessment completed by Attending Physician prior to discharge.  Discharge destination:  Home  Is patient on multiple antipsychotic therapies at discharge:  No   Has Patient had three or more failed trials of antipsychotic monotherapy by history:  No  Recommended Plan for Multiple Antipsychotic Therapies: NA   Allergies as of 03/14/2024       Reactions   Peanut Butter Flavor [flavoring Agent]    Other Itching, Rash, Other (See Comments)   Peanuts   Tylenol  [acetaminophen ] Rash        Medication List     TAKE these medications      Indication  albuterol  108 (90 Base) MCG/ACT inhaler Commonly known as: VENTOLIN  HFA Inhale 2 puffs into the lungs every 4 (four) hours as needed for wheezing or shortness of breath.  Indication: Asthma   FLUoxetine  20 MG capsule Commonly known as: PROZAC  Take 3 capsules (60 mg total) by mouth daily. What changed: how much to take  Indication: Depression   levETIRAcetam  1000 MG tablet Commonly known as: KEPPRA  Take 1,000 mg by mouth 2 (two) times daily.  Indication: Seizure   lisinopril  10 MG tablet Commonly known as: ZESTRIL  Take 10 mg by mouth daily. for high blood pressure  Indication: High Blood Pressure   risperiDONE  2 MG tablet Commonly known as: RISPERDAL  Take 1 tablet (2 mg total) by mouth at bedtime. What changed:  how much to take when to take this additional instructions  Indication: Major Depressive Disorder   traZODone  100 MG tablet Commonly known as: DESYREL  Take 2 tablets (200 mg total) by mouth at bedtime. What changed: how much to take  Indication: Trouble Sleeping        Follow-up Information     Psychotherapeutic Services. Go  to.   Why: Your ACTT team will follow up following discharge. Please reach out immediately following your discharge to make aware of your arrival home. Contact information: [address] [place] [address]  [phone] Fax: [phone] Email: [email or web address]                Follow-up recommendations:  Activity:  As tolerated    Signed: Cobie Leidner, MD 03/14/2024, 11:00 PM

## 2024-03-18 ENCOUNTER — Encounter (HOSPITAL_COMMUNITY): Payer: Self-pay | Admitting: Psychiatry

## 2024-03-18 ENCOUNTER — Emergency Department (HOSPITAL_COMMUNITY)
Admission: EM | Admit: 2024-03-18 | Discharge: 2024-03-19 | Disposition: A | Discharge status: Other Acute Inpatient Hospital | Attending: Emergency Medicine | Admitting: Emergency Medicine

## 2024-03-18 DIAGNOSIS — F332 Major depressive disorder, recurrent severe without psychotic features: Secondary | ICD-10-CM | POA: Insufficient documentation

## 2024-03-18 DIAGNOSIS — X58XXXA Exposure to other specified factors, initial encounter: Secondary | ICD-10-CM | POA: Insufficient documentation

## 2024-03-18 DIAGNOSIS — K0889 Other specified disorders of teeth and supporting structures: Secondary | ICD-10-CM | POA: Insufficient documentation

## 2024-03-18 DIAGNOSIS — Z91010 Allergy to peanuts: Secondary | ICD-10-CM | POA: Insufficient documentation

## 2024-03-18 DIAGNOSIS — R4689 Other symptoms and signs involving appearance and behavior: Secondary | ICD-10-CM | POA: Diagnosis present

## 2024-03-18 DIAGNOSIS — T50912A Poisoning by multiple unspecified drugs, medicaments and biological substances, intentional self-harm, initial encounter: Secondary | ICD-10-CM

## 2024-03-18 DIAGNOSIS — T1491XA Suicide attempt, initial encounter: Secondary | ICD-10-CM | POA: Diagnosis not present

## 2024-03-18 LAB — CBC WITH DIFFERENTIAL/PLATELET
Abs Immature Granulocytes: 0.02 K/uL (ref 0.00–0.07)
Basophils Absolute: 0 K/uL (ref 0.0–0.1)
Basophils Relative: 1 %
Eosinophils Absolute: 0 K/uL (ref 0.0–0.5)
Eosinophils Relative: 0 %
HCT: 46.5 % (ref 39.0–52.0)
Hemoglobin: 16.1 g/dL (ref 13.0–17.0)
Immature Granulocytes: 0 %
Lymphocytes Relative: 13 %
Lymphs Abs: 0.9 K/uL (ref 0.7–4.0)
MCH: 31.6 pg (ref 26.0–34.0)
MCHC: 34.6 g/dL (ref 30.0–36.0)
MCV: 91.4 fL (ref 80.0–100.0)
Monocytes Absolute: 0.5 K/uL (ref 0.1–1.0)
Monocytes Relative: 8 %
Neutro Abs: 5 K/uL (ref 1.7–7.7)
Neutrophils Relative %: 78 %
Platelets: 270 K/uL (ref 150–400)
RBC: 5.09 MIL/uL (ref 4.22–5.81)
RDW: 12.6 % (ref 11.5–15.5)
WBC: 6.4 K/uL (ref 4.0–10.5)
nRBC: 0 % (ref 0.0–0.2)

## 2024-03-18 LAB — COMPREHENSIVE METABOLIC PANEL WITH GFR
ALT: 118 U/L — ABNORMAL HIGH (ref 0–44)
ALT: 108 U/L — ABNORMAL HIGH (ref 0–44)
AST: 74 U/L — ABNORMAL HIGH (ref 15–41)
AST: 58 U/L — ABNORMAL HIGH (ref 15–41)
Albumin: 3.9 g/dL (ref 3.5–5.0)
Albumin: 3.7 g/dL (ref 3.5–5.0)
Alkaline Phosphatase: 81 U/L (ref 38–126)
Alkaline Phosphatase: 81 U/L (ref 38–126)
Anion gap: 12 (ref 5–15)
Anion gap: 11 (ref 5–15)
BUN: 8 mg/dL (ref 6–20)
BUN: 11 mg/dL (ref 6–20)
CO2: 18 mmol/L — ABNORMAL LOW (ref 22–32)
CO2: 21 mmol/L — ABNORMAL LOW (ref 22–32)
Calcium: 8.8 mg/dL — ABNORMAL LOW (ref 8.9–10.3)
Calcium: 8.8 mg/dL — ABNORMAL LOW (ref 8.9–10.3)
Chloride: 107 mmol/L (ref 98–111)
Chloride: 104 mmol/L (ref 98–111)
Creatinine, Ser: 0.92 mg/dL (ref 0.61–1.24)
Creatinine, Ser: 0.88 mg/dL (ref 0.61–1.24)
GFR, Estimated: >60 mL/min (ref >60)
GFR, Estimated: >60 mL/min (ref >60)
Glucose, Bld: 203 mg/dL — ABNORMAL HIGH (ref 70–99)
Glucose, Bld: 91 mg/dL (ref 70–99)
Potassium: 3.9 mmol/L (ref 3.5–5.1)
Potassium: 4 mmol/L (ref 3.5–5.1)
Sodium: 137 mmol/L (ref 135–145)
Sodium: 136 mmol/L (ref 135–145)
Total Bilirubin: 1 mg/dL (ref 0.0–1.2)
Total Bilirubin: 0.6 mg/dL (ref 0.0–1.2)
Total Protein: 7 g/dL (ref 6.5–8.1)
Total Protein: 6.8 g/dL (ref 6.5–8.1)

## 2024-03-18 LAB — RAPID URINE DRUG SCREEN, HOSP PERFORMED
Amphetamines: NOT DETECTED
Barbiturates: NOT DETECTED
Benzodiazepines: NOT DETECTED
Cocaine: NOT DETECTED
Opiates: NOT DETECTED
Tetrahydrocannabinol: POSITIVE — AB

## 2024-03-18 LAB — RESP PANEL BY RT-PCR (RSV, FLU A&B, COVID)  RVPGX2
Influenza A by PCR: NEGATIVE
Influenza B by PCR: NEGATIVE
Resp Syncytial Virus by PCR: NEGATIVE
SARS Coronavirus 2 by RT PCR: NEGATIVE

## 2024-03-18 LAB — SALICYLATE LEVEL
Salicylate Lvl: <7 mg/dL — ABNORMAL LOW (ref 7.0–30.0)
Salicylate Lvl: <7 mg/dL — ABNORMAL LOW (ref 7.0–30.0)

## 2024-03-18 LAB — ACETAMINOPHEN LEVEL
Acetaminophen (Tylenol), Serum: <10 ug/mL — ABNORMAL LOW (ref 10–30)
Acetaminophen (Tylenol), Serum: <10 ug/mL — ABNORMAL LOW (ref 10–30)

## 2024-03-18 LAB — MAGNESIUM: Magnesium: 2.1 mg/dL (ref 1.7–2.4)

## 2024-03-18 LAB — ETHANOL: Alcohol, Ethyl (B): <15 mg/dL (ref <15)

## 2024-03-18 MED ORDER — RISPERIDONE 1 MG PO TBDP: 2.0000 mg | ORAL_TABLET | Freq: Three times a day (TID) | ORAL | Status: DC | PRN

## 2024-03-18 MED ORDER — ZOLPIDEM TARTRATE 5 MG PO TABS: 5.0000 mg | ORAL_TABLET | Freq: Every evening | ORAL | Status: DC | PRN

## 2024-03-18 MED ORDER — ZIPRASIDONE MESYLATE 20 MG IM SOLR: 20.0000 mg | INTRAMUSCULAR | Status: DC | PRN

## 2024-03-18 MED ORDER — LORAZEPAM 1 MG PO TABS
1.0000 mg | ORAL_TABLET | ORAL | Status: AC | PRN
  Administered 2024-03-18: 1 mg via ORAL
  Filled 2024-03-18: qty 1

## 2024-03-18 MED ORDER — LEVETIRACETAM 500 MG PO TABS
1000.0000 mg | ORAL_TABLET | Freq: Two times a day (BID) | ORAL | Status: DC
  Administered 2024-03-18 – 2024-03-19 (×2): 1000 mg via ORAL
  Filled 2024-03-18 (×2): qty 2

## 2024-03-18 MED ORDER — RISPERIDONE 1 MG PO TABS
2.0000 mg | ORAL_TABLET | Freq: Every day | ORAL | Status: DC
  Administered 2024-03-18: 2 mg via ORAL
  Filled 2024-03-18: qty 1
  Filled 2024-03-18: qty 2

## 2024-03-18 MED ORDER — IBUPROFEN 400 MG PO TABS: 600.0000 mg | ORAL_TABLET | Freq: Three times a day (TID) | ORAL | Status: DC | PRN

## 2024-03-18 MED ORDER — TRAZODONE HCL 100 MG PO TABS
200.0000 mg | ORAL_TABLET | Freq: Every day | ORAL | Status: DC
  Administered 2024-03-18: 200 mg via ORAL
  Filled 2024-03-18: qty 2

## 2024-03-18 MED ORDER — NICOTINE 21 MG/24HR TD PT24
21.0000 mg | MEDICATED_PATCH | Freq: Every day | TRANSDERMAL | Status: DC
  Filled 2024-03-18: qty 1

## 2024-03-18 MED ORDER — ONDANSETRON HCL 4 MG PO TABS: 4.0000 mg | ORAL_TABLET | Freq: Three times a day (TID) | ORAL | Status: DC | PRN

## 2024-03-18 MED ORDER — ALUM & MAG HYDROXIDE-SIMETH 200-200-20 MG/5ML PO SUSP: 30.0000 mL | Freq: Four times a day (QID) | ORAL | Status: DC | PRN

## 2024-03-18 MED FILL — Fluoxetine HCl Cap 20 MG: 60.0000 mg | ORAL | Qty: 3 | Status: AC

## 2024-03-18 NOTE — ED Notes (Addendum)
 Patient appears anxious. Patient informed that he has a Nicotine  patch ordered and medication to help him relax. Patient refused the Nicotine  patient but agreed to the medication. Patient ambulated to restroom with no difficulty.

## 2024-03-18 NOTE — ED Provider Notes (Signed)
 [place] EMERGENCY DEPARTMENT AT [facility] Provider Note   CSN: [id number] Arrival date & time: 03/18/24  1204     Patient presents with: No chief complaint on file.   Raymond [name] is a 43 y.o. male.   The history is provided by the patient, medical records and the EMS personnel. No language interpreter was used.     43 year old male history of polysubstance use, major depression, prior suicidal attempt, hepatitis C brought here via EMS with concerns of suicidal attempt.  Patient endorsed having suicidal thoughts as well as having attempt to commit suicide by overdosing on his medication.  States that he took multiple pills from his medicine box throughout yesterday and today.  States he took a total of 65 pills and did contact EMS to bring him here.  States that he would like to go to [facility] as he is comfortable with their but was told that he needs to have medical clearance before he can go.  He denies any homicidal ideation and denies any auditory or visual hallucination.  He mention he drank 1 beer yesterday.  Admits to tobacco use but denies drug use.  Patient is unable to tell me which pills that he took but states he took it sporadically throughout yesterday and this morning.  He mention he did not take all of the pill at once.  He attributed to feeling depressed and missing his mom.  He does have history of intentional drug overdose in the past.  He has been seen in the ED multiple times for same.  Currently he is here voluntarily. He denies having any physical pain no chest pain no abdominal pain no nausea or vomiting.  Prior to Admission medications   Medication Sig Start Date End Date Taking? Authorizing Provider  albuterol  (VENTOLIN  HFA) 108 (90 Base) MCG/ACT inhaler Inhale 2 puffs into the lungs every 4 (four) hours as needed for wheezing or shortness of breath. 10/16/22   Clapacs, Norleen DASEN, MD  FLUoxetine  (PROZAC ) 20 MG capsule Take 3 capsules (60 mg  total) by mouth daily. Patient taking differently: Take 20 mg by mouth daily. 09/09/23   Tingling, Corean, PA-C  levETIRAcetam  (KEPPRA ) 1000 MG tablet Take 1,000 mg by mouth 2 (two) times daily. 02/11/24   [provider]  lisinopril  (ZESTRIL ) 10 MG tablet Take 10 mg by mouth daily. for high blood pressure 02/11/24   [provider]  risperiDONE  (RISPERDAL ) 2 MG tablet Take 1 tablet (2 mg total) by mouth at bedtime. Patient taking differently: Take 1-3 mg by mouth See admin instructions. Take 1/2 tablet by mouth every morning and 1 and 1/2 tablet at night 09/09/23   Tingling, Stephanie, PA-C  traZODone  (DESYREL ) 100 MG tablet Take 2 tablets (200 mg total) by mouth at bedtime. Patient taking differently: Take 150 mg by mouth at bedtime. 09/09/23   Tingling, Corean, PA-C    Allergies: Peanut butter flavor [flavoring agent], Other, and Tylenol  [acetaminophen ]    Review of Systems  All other systems reviewed and are negative.   Updated Vital Signs BP (!) 141/92   Pulse 73   Temp 98 F (36.7 C)   Resp 14   Ht 6' 1 (1.854 m)   Wt 72.1 kg   SpO2 100%   BMI 20.97 kg/m   Physical Exam Constitutional:      General: He is not in acute distress.    Appearance: He is well-developed.     Comments: Patient is walking around  smiling appears to be in no acute discomfort.  HENT:     Head: Atraumatic.     Mouth/Throat:     Comments: Poor dentition Eyes:     Conjunctiva/sclera: Conjunctivae normal.  Cardiovascular:     Rate and Rhythm: Normal rate and regular rhythm.     Pulses: Normal pulses.     Heart sounds: Normal heart sounds.  Pulmonary:     Effort: Pulmonary effort is normal.     Breath sounds: Normal breath sounds.  Abdominal:     Palpations: Abdomen is soft.     Tenderness: There is no abdominal tenderness.  Musculoskeletal:     Cervical back: Normal range of motion and neck supple.  Skin:    Findings: No rash.  Neurological:     Mental Status: He is alert.  Mental status is at baseline.     GCS: GCS eye subscore is 4. GCS verbal subscore is 5. GCS motor subscore is 6.  Psychiatric:        Attention and Perception: Attention normal.        Mood and Affect: Mood normal.        Speech: Speech normal.        Behavior: Behavior is cooperative.        Thought Content: Thought content includes suicidal ideation. Thought content does not include homicidal ideation.     (all labs ordered are listed, but only abnormal results are displayed) Labs Reviewed  COMPREHENSIVE METABOLIC PANEL WITH GFR - Abnormal; Notable for the following components:      Result Value   CO2 18 (*)    Glucose, Bld 203 (*)    Calcium 8.8 (*)    AST 74 (*)    ALT 118 (*)    All other components within normal limits  SALICYLATE LEVEL - Abnormal; Notable for the following components:   Salicylate Lvl <7.0 (*)    All other components within normal limits  ACETAMINOPHEN  LEVEL - Abnormal; Notable for the following components:   Acetaminophen  (Tylenol ), Serum <10 (*)    All other components within normal limits  ETHANOL  CBC WITH DIFFERENTIAL/PLATELET  MAGNESIUM   RAPID URINE DRUG SCREEN, HOSP PERFORMED    EKG: None  Date: 03/18/2024  Rate: 83  Rhythm: normal sinus rhythm  QRS Axis: normal  Intervals: normal  ST/T Wave abnormalities: normal  Conduction Disutrbances: none  Narrative Interpretation:   Old EKG Reviewed: No significant changes noted    Radiology: No results found.   Procedures   Medications Ordered in the ED - No data to display                                  Medical Decision Making Amount and/or Complexity of Data Reviewed Labs: ordered.   BP (!) 141/92   Pulse 73   Temp 98 F (36.7 C)   Resp 14   Ht 6' 1 (1.854 m)   Wt 72.1 kg   SpO2 100%   BMI 20.97 kg/m   80:81 PM 43 year old male history of polysubstance use, major depression, prior suicidal attempt, hepatitis C brought here via EMS with concerns of suicidal attempt.   Patient endorsed having suicidal thoughts as well as having attempt to commit suicide by overdosing on his medication.  States that he took multiple pills from his medicine box throughout yesterday and today.  States he took a total of 65 pills and did contact  EMS to bring him here.  States that he would like to go to [facility] as he is comfortable with their but was told that he needs to have medical clearance before he can go.  He denies any homicidal ideation and denies any auditory or visual hallucination.  He mention he drank 1 beer yesterday.  Admits to tobacco use but denies drug use.  Patient is unable to tell me which pills that he took but states he took it sporadically throughout yesterday and this morning.  He mention he did not take all of the pill at once.  He attributed to feeling depressed and missing his mom.  He does have history of intentional drug overdose in the past.  He has been seen in the ED multiple times for same.  Currently he is here voluntarily.  He denies having any physical pain no chest pain no abdominal pain no nausea or vomiting.  On exam, patient is smiling appears to be in no acute discomfort.  Heart with normal rate and rhythm, lungs are clear, abdomen is soft nontender, patient is mentating appropriately.  He is making good eye contact  Poison control was notified and states that patient would benefit from 6 hours monitoring, check EKG, CBC, CMP, magnesium  level, cardiac monitoring.  -Labs ordered, independently viewed and interpreted by me.  Labs remarkable for transaminitis with AST 74, ALT 118. -The patient was maintained on a cardiac monitor.  I personally viewed and interpreted the cardiac monitored which showed an underlying rhythm of: NSR -This patient presents to the ED for concern of suicidal attempt, this involves an extensive number of treatment options, and is a complaint that carries with it a high risk of complications and morbidity.  The  differential diagnosis includes depression, suicidal, drug induce mood instability -Co morbidities that complicate the patient evaluation includes edpression, prior suicidal attempts, polysubstance use -Treatment includes supportive care -Reevaluation of the patient after these medicines showed that the patient improved -PCP office notes or outside notes reviewed -Discussion with oncoming provider who will monitor pt for a total of 6 hrs from the time of arrival.  Once medically cleared TTS and psych will evaluate.  Pt is here voluntarily -Escalation to admission/observation considered: dispo pending     Final diagnoses:  Suicide attempt [facility])    ED Discharge Orders     None          Nivia Colon, PA-C 03/18/24 1515    Mannie Pac T, DO 03/25/24 2214

## 2024-03-18 NOTE — Progress Notes (Signed)
 Inpatient Psychiatric Referral  Patient was recommended inpatient per Jerel Gravely, NP . There are no available beds at [facility], per [facility]. Patient was referred to the following out of network facilities:  Destination  Service Provider Address Phone Fax  [facility]  [address], [place] [address] [phone]  [facility]  [address] [place], [place] [address] [phone]  [facility]  [address], [place] [address] [phone]  [facility]  [address] [place] [address] [phone]  [facility]  [address], [place] [address] [phone]  [facility] EFAX  [address] [place], [place] [phone]  [facility]  [address] [place] [address] [phone]  [facility]  [address] [place] [address] [phone]    Situation ongoing, CSW to continue following and update chart as more information becomes available.   Harrie Sofia MSW, LCSWA 03/18/2024  6:37 PM

## 2024-03-18 NOTE — ED Notes (Signed)
Pt ambulatory to restroom w/o assistance 

## 2024-03-18 NOTE — ED Notes (Signed)
 Patient appears anxious. Patient informed that he has a Nicotine  patch and asked if he wanted it. Patient refused. Patient offered medication for anxiety. Patient reported that he would take medication to relax.

## 2024-03-18 NOTE — ED Notes (Signed)
 Pt dressed into paper scrubs, wanded by security and belongings placed in locker #3. Home meds given to pharmacy.

## 2024-03-18 NOTE — Consult Note (Signed)
 [facility] Psychiatric Consult Initial  Patient Name: .Raymond [name]  MRN: [id number]  DOB: [birth year]  Consult Order details:  Orders (From admission, onward)     Start     Ordered   03/18/24 1450  CONSULT TO CALL ACT TEAM       Ordering Provider: Nivia Colon, PA-C  Provider:  (Not yet assigned)  Question:  Reason for Consult?  Answer:  Psych consult   03/18/24 1450   03/18/24 1450  IP CONSULT TO PSYCHIATRY       Ordering Provider: Nivia Colon, PA-C  Provider:  (Not yet assigned)  Question:  Reason for consult:  Answer:  Medication management   03/18/24 1450             Mode of Visit: In person    Psychiatry Consult Evaluation  Service Date: March 18, 2024 LOS:  LOS: 0 days  Chief Complaint: Suicide attempt by multiple drug overdose  Primary Psychiatric Diagnoses    Major depressive disorder, recurrent, severe without psychotic behavior (HCC)  Assessment   Raymond [name] is a 43 y.o. Hispanic male with a past psychiatric history of PTSD, MDD with and without psychotic features, polysubstance abuse, and GAD, with pertinent medical comorbidities/history that includes hepatitis C and seizure disorder on Keppra , who presented this encounter by way of EMS, after alleged and endorsed suicide attempt by way of attempted overdose on multiple of the patient's prescribed medications, in the context of psychosocial stressors, who upon EDP evaluation, consulted psychiatry for specialty evaluation and recommendations.  Patient currently is still awaiting medical clearance, but is deemed appropriate for examination, as well as is currently voluntary at this time, per EDP team.  Patient notably just discharged from [facility] inpatient mental health 03/14/2024.  Upon evaluation, patient presents with symptomology that is most consistent with an acute decompensation of the patient's primary chronic illness course of unipolar major depression, with current episode being severe, and without psychotic  features, though very high clinical suspicion for adult antisocial behavior and malingering.  Patient is reluctant to participate in largely any real and meaningful examination, but nonetheless, given investigation conducted, for safety and stabilization of the patient, recommendation is for inpatient mental health hospitalization.  **Very high clinical suspicion for malingering; recommend during serial examinations providers assess daily for evidence to support this or rule out.**   Diagnoses:  Active Hospital problems: Principal Problem:   Suicide attempt by multiple drug overdose (HCC) Active Problems:   Major depressive disorder, recurrent, severe without psychotic behavior (HCC)    Plan   # MDD  ## Psychiatric Medication Recommendations:   - Recommend continue the patient's current outpatient psychiatric medication regimen listed below --> Prozac  60 mg p.o. daily --> Risperdal  2 mg p.o. nightly --> Trazodone  200 mg p.o. nightly   ## Medical Decision Making Capacity: Not specifically addressed in this encounter  ## Further Work-up: UDS  ## Disposition:-- We recommend inpatient psychiatric hospitalization when medically cleared. Patient is under voluntary admission status at this time; please IVC if attempts to leave hospital.  ## Behavioral / Environmental: -Strict agitation/safety precautions    ## Safety and Observation Level:  - Based on my clinical evaluation, I estimate the patient to be at moderate risk of self harm in the current setting. - At this time, we recommend  1:1 Observation. This decision is based on my review of the chart including patient's history and current presentation, interview of the patient, mental status examination, and consideration of suicide risk including evaluating suicidal  ideation, plan, intent, suicidal or self-harm behaviors, risk factors, and protective factors. This judgment is based on our ability to directly address suicide risk,  implement suicide prevention strategies, and develop a safety plan while the patient is in the clinical setting. Please contact our team if there is a concern that risk level has changed.  CSSR Risk Category:C-SSRS RISK CATEGORY: High Risk  Suicide Risk Assessment: Patient has following modifiable risk factors for suicide: under treated depression , recklessness, medication noncompliance, active mental illness (to encompass adhd, tbi, mania, psychosis, trauma reaction), current symptoms: anxiety/panic, insomnia, impulsivity, anhedonia, hopelessness, triggering events, recent psychiatric hospitalization, pain, medical illness (ie new dx of cancer), and recent loss (death, isolation, vocation), which we are addressing by recommendations. Patient has following non-modifiable or demographic risk factors for suicide: male gender, history of suicide attempt, history of self harm behavior, and psychiatric hospitalization Patient has the following protective factors against suicide: Access to outpatient mental health care and Supportive friends  Thank you for this consult request. Recommendations have been communicated to the primary team.  We will continue to follow at this time.   [name], NP       History of Present Illness   Raymond [name] is a 43 y.o. Hispanic male with a past psychiatric history of PTSD, MDD with and without psychotic features, polysubstance abuse, and GAD, with pertinent medical comorbidities/history that includes hepatitis C and seizure disorder on Keppra , who presented this encounter by way of EMS, after alleged and endorsed suicide attempt by way of attempted overdose on multiple of the patient's prescribed medications, in the context of psychosocial stressors, who upon EDP evaluation, consulted psychiatry for specialty evaluation and recommendations.  Patient currently is still awaiting medical clearance, but is deemed appropriate for examination, as well as is currently  voluntary at this time, per EDP team.  Patient notably just discharged from [facility] inpatient mental health 03/14/2024.  Patient seen today at the [facility] emergency department for face-to-face psychiatric evaluation.  Upon evaluation, patient engagement characterized by short and abrupt affirmation that he did in fact attempt suicide by way of consuming multiple of his prescribed medications, in an attempt to commit suicide, in the context of, thinking about my mother, but outside of giving this brief affirmation in a curt and apathetic manor, and then proceeding to repetitively and perseveratively endorse that he wants to go to, [facility], he is completely uncooperative during examination, thus examination engagement is terminated.  Objectively, patient presents with grossly intact orientation, without concerns for fluctuations in consciousness, and without any appreciable evidence of psychotic features.  Patient presents objectively without any physical distress.  Patient does not appear intoxicated or in any sort of withdrawal.  Review of Systems  Unable to perform ROS: Other (Not amenable)     Psychiatric and Social History  Psychiatric History:  Information collected from chart review  Prev Dx/Sx: As above Current Psych Provider: Act Team  Home Meds (current): Risperdal , Prozac , trazodone  Previous Med Trials: See chart Therapy: Act Team   Prior Psych Hospitalization: Multiple, most recent [facility] only few short days ago Prior Self Harm: Multiple accounts, including this encounter Prior Violence: None reported  Family Psych History: None reported Family Hx suicide: None reported  Social History:  Developmental Hx: None reported Educational Hx: None reported Occupational Hx: [employer] Legal Hx: None reported Living Situation: Apartment with roommates  Spiritual Hx: None reported Access to weapons/lethal means: None reported  Substance History Alcohol: None reported,  but  hx per chart review of abuse  Illicit drugs: Polysubstance abuse, none reported today  Prescription drug abuse: Polysubstance abuse hx Rehab hx: None reported  Exam Findings  Physical Exam: As below Vital Signs:  Temp:  [98 F (36.7 C)] 98 F (36.7 C) (10/17 1242) Pulse Rate:  [73] 73 (10/17 1242) Resp:  [14] 14 (10/17 1242) BP: (141)/(92) 141/92 (10/17 1242) SpO2:  [99 %-100 %] 99 % (10/17 1321) Weight:  [72.1 kg] 72.1 kg (10/17 1242) Blood pressure (!) 141/92, pulse 73, temperature 98 F (36.7 C), resp. rate 14, height 6' 1 (1.854 m), weight 72.1 kg, SpO2 99%. Body mass index is 20.97 kg/m.  Physical Exam Vitals and nursing note reviewed.  Constitutional:      General: He is not in acute distress.    Appearance: He is not ill-appearing, toxic-appearing or diaphoretic.     Comments: Uncooperative and apathetic interpersonal style; malodorous and disheveled  Pulmonary:     Effort: Pulmonary effort is normal.  Skin:    General: Skin is dry.  Neurological:     Mental Status: He is alert.     Motor: No weakness, tremor or seizure activity.     Comments: Grossly intact, no concerns for fluctuations in consciousness   Psychiatric:        Attention and Perception: He is inattentive.        Behavior: Behavior is uncooperative.        Judgment: Judgment is impulsive and inappropriate.     Comments: Attention and perception: No appreciable evidence objectively of responding to internal and or external stimuli; appears selectively inattentive Mood: Unable to assess Affect: Oddly neutral  Speech: Abrupt, curt, minimal Cognition and memory: Grossly intact Thought process: Minimal, superficial, perseverative Behavior: uncooperative and apathetic       Mental Status Exam: General Appearance: Disheveled and malodorous Hispanic male with normal bulk and tone in scrubs with severely poor dentition with uncooperative and apathetic interpersonal style  Orientation:  Other:   Grossly intact, no concerns for fluctuations of consciousness  Memory: Grossly intact  Concentration: Intentionally inattentive  Recall: Grossly intact  Attention : Intentionally inattentive  Eye Contact:  Absent  Speech:  Minimal, abrupt, curt   Language:  Fair  Volume:  Normal  Mood: Unable to formally assess  Affect:  Oddly neutral  Thought Process:  Coherent, short, superficial, minimal, perseverative  Thought Content:  Perseverative and minimal illogical  Suicidal Thoughts:  Unable to assess  Homicidal Thoughts:  Unable to assess  Judgement:  Other:  Acutely poor  Insight:  Lacking  Psychomotor Activity:  Normal  Akathisia:  No  Fund of Knowledge:  Grossly intact      Assets:  Health and safety inspector Housing Leisure Time Physical Health Resilience Social Support Talents/Skills Transportation Vocational/Educational  Cognition: Grossly intact  ADL's: Not attending to ADLs, but appears grossly able  AIMS (if indicated):   0     Other History   These have been pulled in through the EMR, reviewed, and updated if appropriate.  Family History:  The patient's family history is not on file.  Medical History: Past Medical History:  Diagnosis Date  . Alcohol abuse   . Anxiety   . Hepatitis C   . PTSD (post-traumatic stress disorder)     Surgical History: Past Surgical History:  Procedure Laterality Date  . FACIAL FRACTURE SURGERY     metal plate under right eye     Medications:   Current Facility-Administered Medications:  .  alum &  mag hydroxide-simeth (MAALOX/MYLANTA) 200-200-20 MG/5ML suspension 30 mL, 30 mL, Oral, Q6H PRN, Tran, Bowie, PA-C .  ibuprofen  (ADVIL ) tablet 600 mg, 600 mg, Oral, Q8H PRN, Tran, Bowie, PA-C .  risperiDONE  (RISPERDAL  M-TABS) disintegrating tablet 2 mg, 2 mg, Oral, Q8H PRN **AND** LORazepam  (ATIVAN ) tablet 1 mg, 1 mg, Oral, PRN **AND** ziprasidone  (GEODON ) injection 20 mg, 20 mg, Intramuscular, PRN, Tran, Bowie, PA-C .   nicotine  (NICODERM CQ  - dosed in mg/24 hours) patch 21 mg, 21 mg, Transdermal, Daily, Tran, Bowie, PA-C .  ondansetron  (ZOFRAN ) tablet 4 mg, 4 mg, Oral, Q8H PRN, Tran, Bowie, PA-C .  zolpidem (AMBIEN) tablet 5 mg, 5 mg, Oral, QHS PRN, Tran, Bowie, PA-C  Current Outpatient Medications:  .  albuterol  (VENTOLIN  HFA) 108 (90 Base) MCG/ACT inhaler, Inhale 2 puffs into the lungs every 4 (four) hours as needed for wheezing or shortness of breath., Disp: 6.7 g, Rfl: 0 .  FLUoxetine  (PROZAC ) 20 MG capsule, Take 3 capsules (60 mg total) by mouth daily. (Patient taking differently: Take 20 mg by mouth daily.), Disp: 90 capsule, Rfl: 0 .  levETIRAcetam  (KEPPRA ) 1000 MG tablet, Take 1,000 mg by mouth 2 (two) times daily., Disp: , Rfl:  .  lisinopril  (ZESTRIL ) 10 MG tablet, Take 10 mg by mouth daily. for high blood pressure, Disp: , Rfl:  .  risperiDONE  (RISPERDAL ) 2 MG tablet, Take 1 tablet (2 mg total) by mouth at bedtime. (Patient taking differently: Take 1-3 mg by mouth See admin instructions. Take 1/2 tablet by mouth every morning and 1 and 1/2 tablet at night), Disp: 30 tablet, Rfl: 0 .  traZODone  (DESYREL ) 100 MG tablet, Take 2 tablets (200 mg total) by mouth at bedtime. (Patient taking differently: Take 150 mg by mouth at bedtime.), Disp: 60 tablet, Rfl: 0  Allergies: Allergies  Allergen Reactions  . Peanut Butter Flavor [Flavoring Agent]   . Other Itching, Rash and Other (See Comments)    Peanuts  . Tylenol  [Acetaminophen ] Rash    [name], NP

## 2024-03-18 NOTE — ED Notes (Signed)
 Spoke with Danille from [employer], provided EKG results and repeat lab results. Per poison control, pt is cleared and no further follow up needed.

## 2024-03-18 NOTE — ED Notes (Signed)
 Patient resting in bed. Patient connected to the cardiac monitor. Patient is showing no signs to acute distress.

## 2024-03-18 NOTE — ED Notes (Addendum)
 Safety rounding completed. Pt resting in bed and watching TV

## 2024-03-18 NOTE — ED Notes (Signed)
 Patient given warm blanket and something to drink. Patient updated on plan of care. Patient appears to be in no acute distress. Patient denies other needs at this time.

## 2024-03-18 NOTE — ED Notes (Signed)
 Ezra called for report from [facility] for possible placement. No acceptance at this time.

## 2024-03-18 NOTE — ED Notes (Signed)
 Spoke with [facility] at this time, gave a report on the patient's condition and currently waiting on completed medical clearance.

## 2024-03-18 NOTE — Progress Notes (Signed)
 Pt has been accepted to [facility] on 03/18/2024. Bed assignment: Main campus  Pt meets inpatient criteria per Jerel Gravely, NP   Attending Physician will be Millie Manners, MD  Report can be called to: [phone] (this is a pager, please leave call-back number when giving report)  Pt can arrive after asap  Care Team Notified:  Duwaine Flor, RN

## 2024-03-18 NOTE — ED Notes (Signed)
 Patient resting in bed, awake, and appears to be in no distress. Patient denies needs at this time.

## 2024-03-18 NOTE — ED Triage Notes (Signed)
 Patient presents to the ER via EMS from home with c/o of a drug overdose in an attempt to kill himself. Patient reported taking an unknown about of Lisinopril  and Keppra  last night around 2000-2200. Patient reports multiple syncopal episode last night. Patient is currently awake, and answering questions appropriately. Patient does report taking the medication in attempt to kill himself. Patient denies attempting this in the past.  Poison control call and RN spoke with Gine, RN for case number [id number]. Recommendations verbalized to Bowie-PA-C. Patient's belongings/medications removed from patient's area. Patient searched by security. Patient put in paper scrubs.

## 2024-03-19 MED ADMIN — Fluoxetine HCl Cap 20 MG: 60 mg | ORAL | NDC 00904734661

## 2024-03-19 NOTE — ED Notes (Signed)
 Call for safe transport for pt to [facility]

## 2024-03-19 NOTE — ED Notes (Signed)
 Safety rounding complete. Pt asleep.

## 2024-03-19 NOTE — ED Notes (Signed)
 Safety rounding complete. Pt watching TV and denies any needs.
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
